# Patient Record
Sex: Female | Born: 1945
Health system: Southern US, Community
[De-identification: ages and names within clinical notes are randomized; demographics above are authoritative.]

## PROBLEM LIST (undated history)

## (undated) DIAGNOSIS — W19XXXA Unspecified fall, initial encounter: Secondary | ICD-10-CM

## (undated) DIAGNOSIS — I10 Essential (primary) hypertension: Secondary | ICD-10-CM

## (undated) DIAGNOSIS — F329 Major depressive disorder, single episode, unspecified: Secondary | ICD-10-CM

## (undated) DIAGNOSIS — R296 Repeated falls: Secondary | ICD-10-CM

## (undated) DIAGNOSIS — G4730 Sleep apnea, unspecified: Secondary | ICD-10-CM

## (undated) DIAGNOSIS — R011 Cardiac murmur, unspecified: Secondary | ICD-10-CM

## (undated) DIAGNOSIS — F419 Anxiety disorder, unspecified: Secondary | ICD-10-CM

## (undated) DIAGNOSIS — K648 Other hemorrhoids: Secondary | ICD-10-CM

## (undated) DIAGNOSIS — M1990 Unspecified osteoarthritis, unspecified site: Secondary | ICD-10-CM

## (undated) DIAGNOSIS — K222 Esophageal obstruction: Secondary | ICD-10-CM

## (undated) DIAGNOSIS — E119 Type 2 diabetes mellitus without complications: Secondary | ICD-10-CM

## (undated) DIAGNOSIS — IMO0001 Reserved for inherently not codable concepts without codable children: Secondary | ICD-10-CM

## (undated) DIAGNOSIS — K5730 Diverticulosis of large intestine without perforation or abscess without bleeding: Secondary | ICD-10-CM

## (undated) DIAGNOSIS — K219 Gastro-esophageal reflux disease without esophagitis: Secondary | ICD-10-CM

## (undated) HISTORY — DX: Other hemorrhoids: K64.8

## (undated) HISTORY — DX: Diverticulosis of large intestine without perforation or abscess without bleeding: K57.30

## (undated) HISTORY — DX: Esophageal obstruction: K22.2

## (undated) HISTORY — DX: Repeated falls: R29.6

## (undated) HISTORY — DX: Anxiety disorder, unspecified: F41.9

## (undated) HISTORY — DX: Unspecified osteoarthritis, unspecified site: M19.90

## (undated) HISTORY — PX: SPLENECTOMY: SUR1306

## (undated) HISTORY — DX: Essential (primary) hypertension: I10

## (undated) HISTORY — PX: COLONOSCOPY: SHX174

## (undated) HISTORY — PX: ABDOMINAL HYSTERECTOMY: SUR658

## (undated) HISTORY — DX: Unspecified fall, initial encounter: W19.XXXA

## (undated) HISTORY — DX: Major depressive disorder, single episode, unspecified: F32.9

## (undated) HISTORY — DX: Sleep apnea, unspecified: G47.30

## (undated) HISTORY — DX: Gastro-esophageal reflux disease without esophagitis: K21.9

## (undated) HISTORY — PX: TIBIA FRACTURE SURGERY: SHX806

---

## 1978-10-25 HISTORY — PX: BREAST CYST EXCISION: SHX579

## 1997-09-18 ENCOUNTER — Other Ambulatory Visit: Admission: RE | Admit: 1997-09-18 | Discharge: 1997-09-18 | Payer: Self-pay | Admitting: Gynecology

## 1997-11-10 ENCOUNTER — Inpatient Hospital Stay (HOSPITAL_COMMUNITY): Admission: RE | Admit: 1997-11-10 | Discharge: 1997-11-12 | Payer: Self-pay | Admitting: Gynecology

## 1997-12-01 ENCOUNTER — Emergency Department (HOSPITAL_COMMUNITY): Admission: EM | Admit: 1997-12-01 | Discharge: 1997-12-01 | Payer: Self-pay | Admitting: Internal Medicine

## 1999-11-14 ENCOUNTER — Ambulatory Visit (HOSPITAL_BASED_OUTPATIENT_CLINIC_OR_DEPARTMENT_OTHER): Admission: RE | Admit: 1999-11-14 | Discharge: 1999-11-14 | Payer: Self-pay | Admitting: Pulmonary Disease

## 1999-12-23 ENCOUNTER — Other Ambulatory Visit: Admission: RE | Admit: 1999-12-23 | Discharge: 1999-12-23 | Payer: Self-pay | Admitting: Gynecology

## 2001-01-13 ENCOUNTER — Other Ambulatory Visit: Admission: RE | Admit: 2001-01-13 | Discharge: 2001-01-13 | Payer: Self-pay | Admitting: Gynecology

## 2002-09-21 ENCOUNTER — Encounter: Payer: Self-pay | Admitting: Internal Medicine

## 2002-09-21 ENCOUNTER — Encounter: Admission: RE | Admit: 2002-09-21 | Discharge: 2002-09-21 | Payer: Self-pay | Admitting: Internal Medicine

## 2003-01-15 ENCOUNTER — Other Ambulatory Visit: Admission: RE | Admit: 2003-01-15 | Discharge: 2003-01-15 | Payer: Self-pay | Admitting: Gynecology

## 2004-01-03 ENCOUNTER — Ambulatory Visit: Payer: Self-pay | Admitting: Adult Health

## 2004-01-16 ENCOUNTER — Other Ambulatory Visit: Admission: RE | Admit: 2004-01-16 | Discharge: 2004-01-16 | Payer: Self-pay | Admitting: Gynecology

## 2004-07-14 ENCOUNTER — Ambulatory Visit: Payer: Self-pay | Admitting: Internal Medicine

## 2004-07-15 ENCOUNTER — Ambulatory Visit: Payer: Self-pay | Admitting: Internal Medicine

## 2004-08-19 ENCOUNTER — Ambulatory Visit: Payer: Self-pay | Admitting: Internal Medicine

## 2004-11-27 ENCOUNTER — Ambulatory Visit: Payer: Self-pay | Admitting: Internal Medicine

## 2004-12-02 ENCOUNTER — Ambulatory Visit: Payer: Self-pay | Admitting: Internal Medicine

## 2004-12-09 ENCOUNTER — Ambulatory Visit: Payer: Self-pay | Admitting: Internal Medicine

## 2005-01-08 ENCOUNTER — Ambulatory Visit: Payer: Self-pay | Admitting: Internal Medicine

## 2005-01-12 ENCOUNTER — Ambulatory Visit: Payer: Self-pay | Admitting: Internal Medicine

## 2005-01-22 ENCOUNTER — Ambulatory Visit: Payer: Self-pay | Admitting: Internal Medicine

## 2005-02-24 ENCOUNTER — Other Ambulatory Visit: Admission: RE | Admit: 2005-02-24 | Discharge: 2005-02-24 | Payer: Self-pay | Admitting: Gynecology

## 2005-07-23 ENCOUNTER — Ambulatory Visit: Payer: Self-pay | Admitting: Internal Medicine

## 2005-08-05 ENCOUNTER — Ambulatory Visit: Payer: Self-pay | Admitting: Internal Medicine

## 2005-08-10 ENCOUNTER — Ambulatory Visit: Payer: Self-pay | Admitting: Internal Medicine

## 2005-08-17 ENCOUNTER — Ambulatory Visit: Payer: Self-pay | Admitting: Cardiology

## 2005-08-17 IMAGING — CT CT PARANASAL SINUSES LIMITED
1 of 2 series · 15 of 25 positions shown, 19 images · non-contrast
Comparison: None.

CLINICAL DATA: 59 year-old with sinus congestion.
 LIMITED CT OF PARANASAL SINUSES:
TECHNIQUE: Limited coronal CT images were obtained through the paranasal sinuses without intravenous contrast.

[Series 4: ltd sinus 3.0 h30s · axial · 0.29mm/px · z∈[-110,-12]mm · 15 of 24 slices shown, 19 images]
[im 2/24  brain]
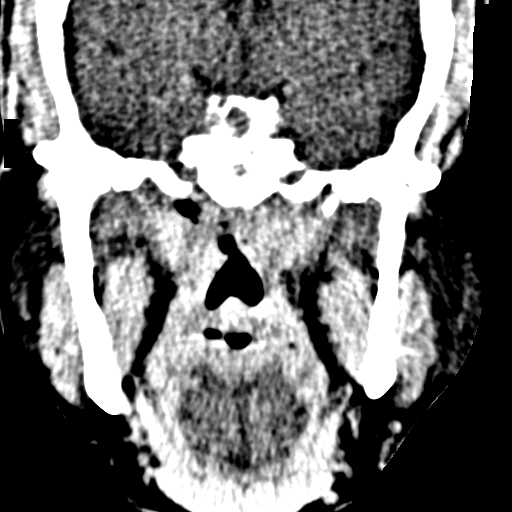
[im 2/24  bone]
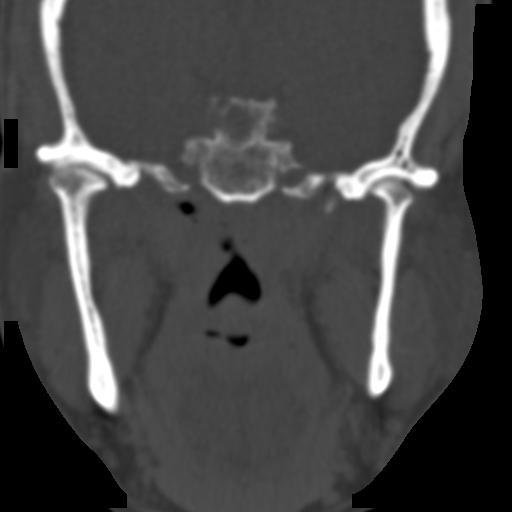
[im 4/24  bone]
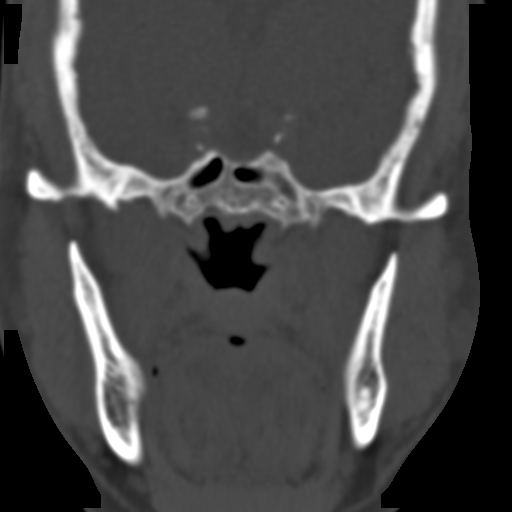
[im 5/24  bone]
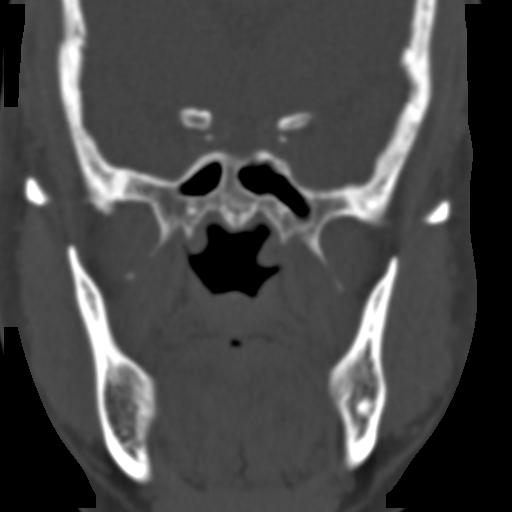
[im 7/24  bone]
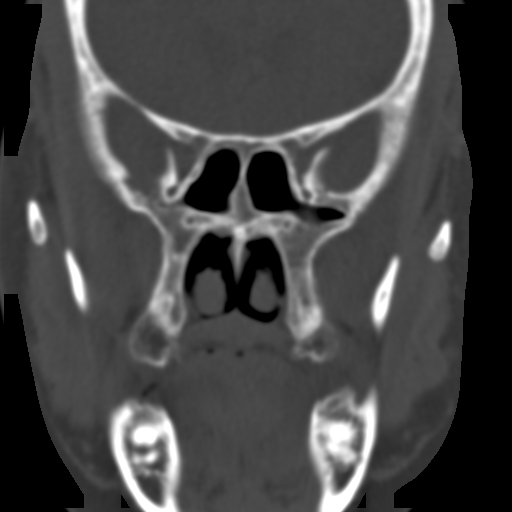
[im 8/24  brain]
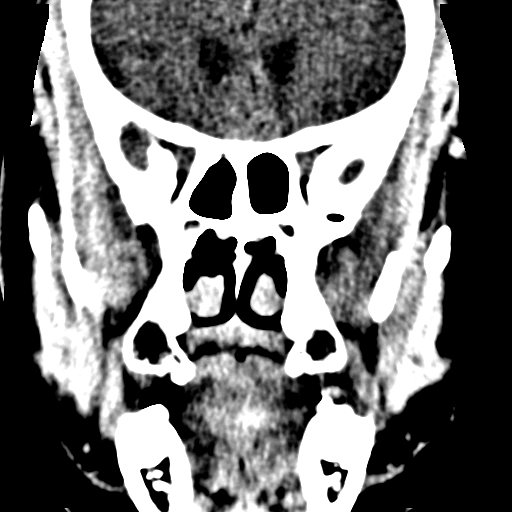
[im 8/24  bone]
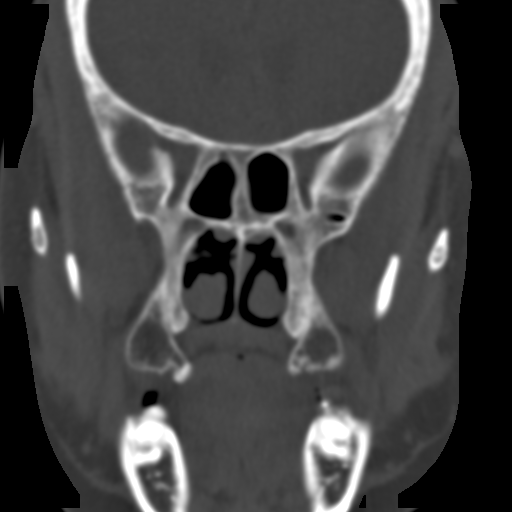
[im 10/24  bone]
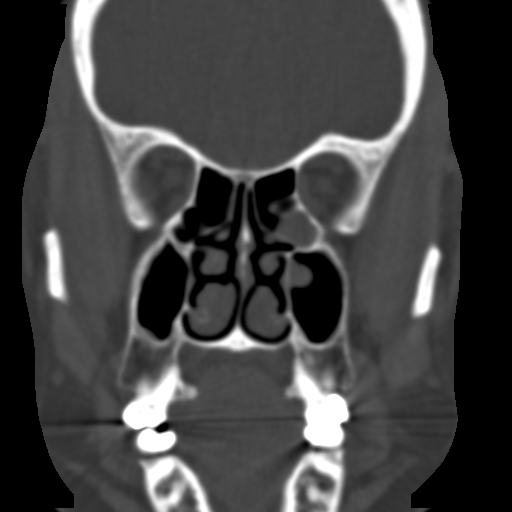
[im 11/24  bone]
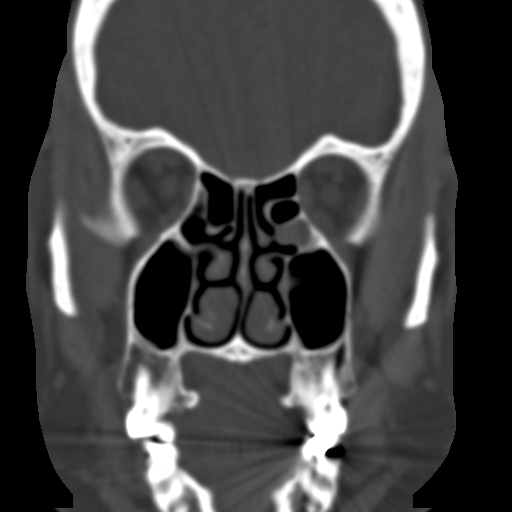
[im 13/24  bone]
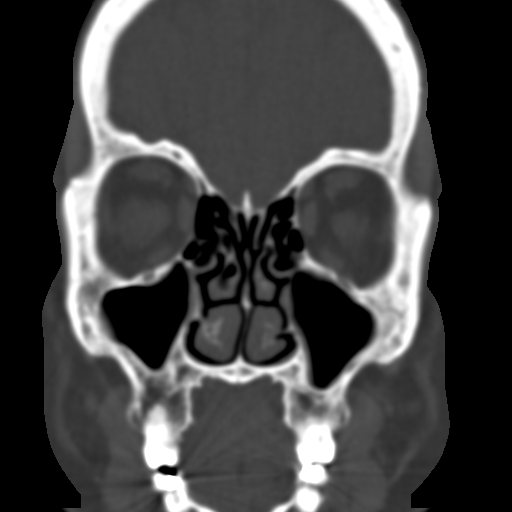
[im 14/24  brain]
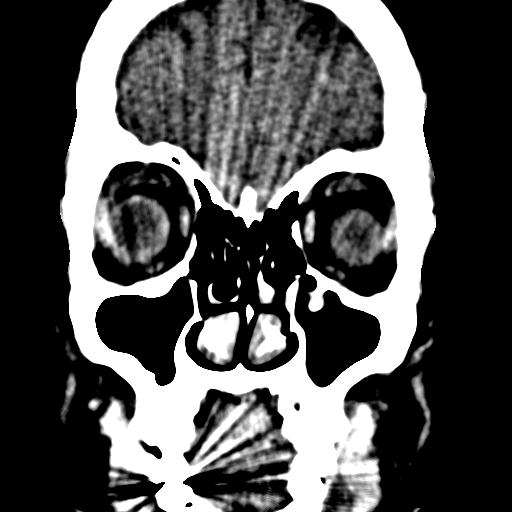
[im 14/24  bone]
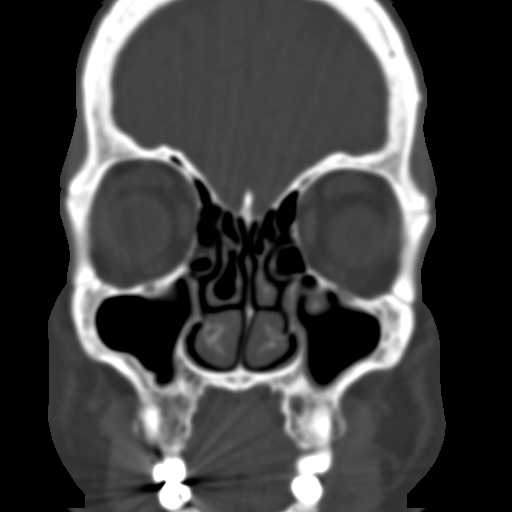
[im 15/24  bone]
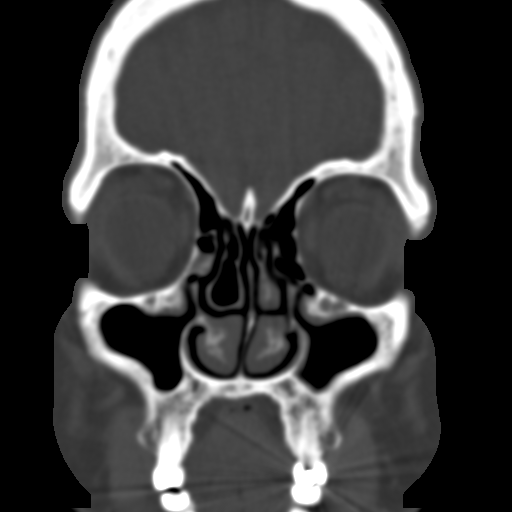
[im 17/24  bone]
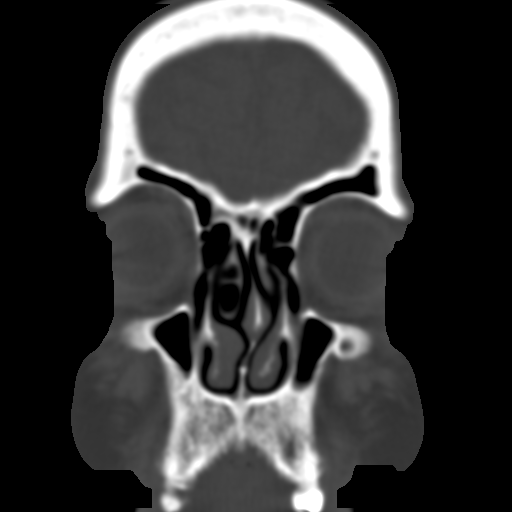
[im 18/24  bone]
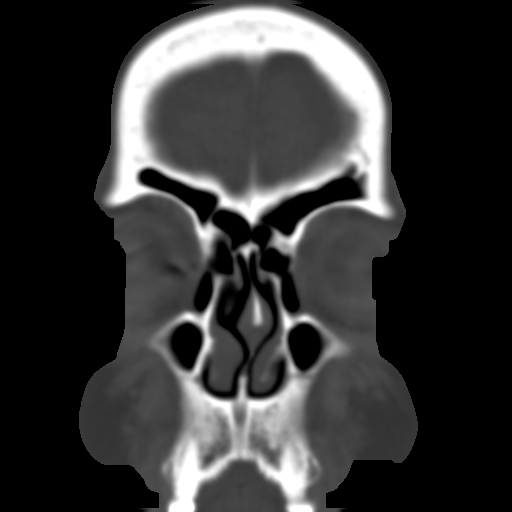
[im 20/24  brain]
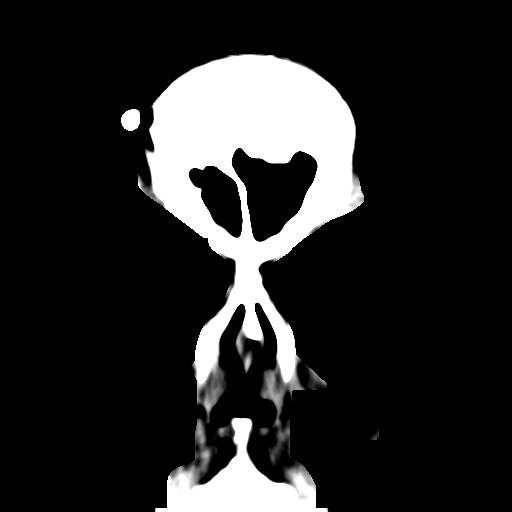
[im 20/24  bone]
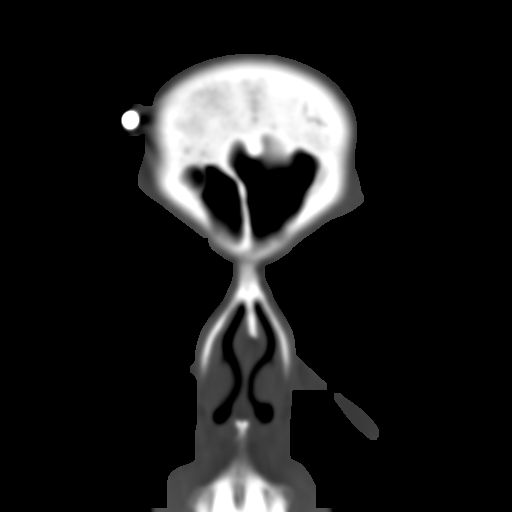
[im 21/24  bone]
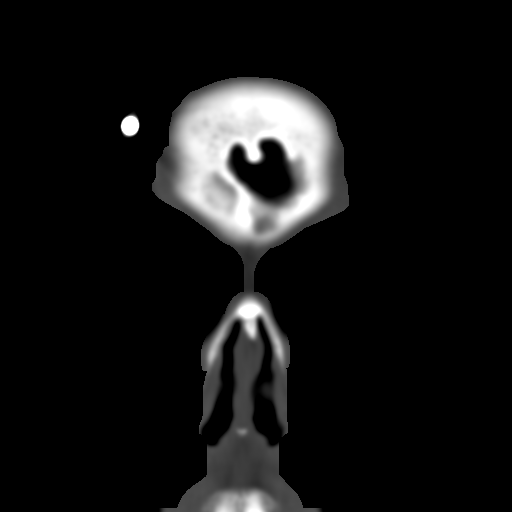
[im 23/24  bone]
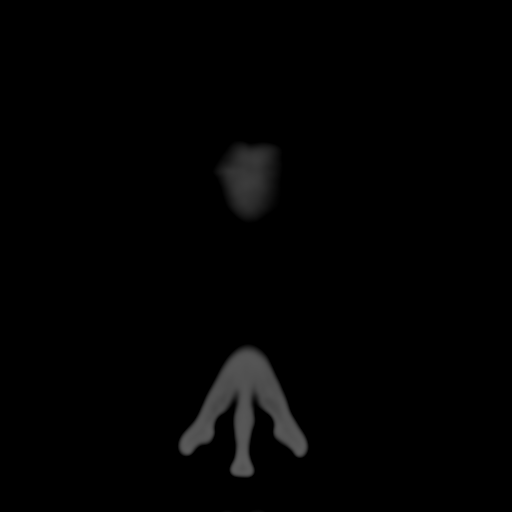

[15 of 25 positions shown; findings below may reference images not displayed]

FINDINGS: There is mild mucoperiosteal thickening involving the maxillary sinuses, left greater than right.  There is also a small mucus retention cyst or polyp in the left maxillary sinus.  There is also a mucus retention cyst or polyp in the left ethmoid air cell. There is concha bullosa of the right middle turbinate. The ostiomeatal complexes are grossly patent. There is fairly marked deviation of the bony nasal septum leftward with mild narrowing of the left middle meatus.  Mild mucosal thickening involving the inferior turbinates bilaterally.  The sphenoid sinus walls are slightly thickened, but no active sinus disease. The frontal sinuses are clear.
IMPRESSION: 1.  Mild mucoperiosteal thickening involving the maxillary sinuses and small mucus retention cyst or polyp in the left maxillary sinus and also in a left ethmoid air cell.
 2.  Deviation of the bony nasal septum leftward with mild narrowing of the left middle meatus.
 3.   Concha bullosa right middle turbinate.
 4.  Grossly patent ostiomeatal complexes.

## 2005-08-18 ENCOUNTER — Ambulatory Visit: Payer: Self-pay | Admitting: Internal Medicine

## 2005-10-27 ENCOUNTER — Ambulatory Visit: Payer: Self-pay | Admitting: Internal Medicine

## 2005-12-04 ENCOUNTER — Ambulatory Visit: Payer: Self-pay | Admitting: Internal Medicine

## 2005-12-28 ENCOUNTER — Ambulatory Visit: Payer: Self-pay | Admitting: Internal Medicine

## 2006-03-29 ENCOUNTER — Ambulatory Visit: Payer: Self-pay | Admitting: Internal Medicine

## 2006-04-06 ENCOUNTER — Ambulatory Visit: Payer: Self-pay | Admitting: Internal Medicine

## 2006-04-06 LAB — CONVERTED CEMR LAB
ALT: 38 units/L (ref 0–40)
AST: 27 units/L (ref 0–37)
Albumin: 3.4 g/dL — ABNORMAL LOW (ref 3.5–5.2)
BUN: 14 mg/dL (ref 6–23)
Basophils Absolute: 0.2 10*3/uL — ABNORMAL HIGH (ref 0.0–0.1)
Bilirubin, Direct: 0.1 mg/dL (ref 0.0–0.3)
Calcium: 9.6 mg/dL (ref 8.4–10.5)
Chloride: 99 meq/L (ref 96–112)
Cholesterol: 197 mg/dL (ref 0–200)
Creatinine, Ser: 1 mg/dL (ref 0.4–1.2)
Eosinophils Absolute: 0.7 10*3/uL — ABNORMAL HIGH (ref 0.0–0.6)
Eosinophils Relative: 6.2 % — ABNORMAL HIGH (ref 0.0–5.0)
GFR calc Af Amer: 73 mL/min
GFR calc non Af Amer: 60 mL/min
Glucose, Bld: 123 mg/dL — ABNORMAL HIGH (ref 70–99)
HDL: 60.2 mg/dL (ref >39.0)
Hgb A1c MFr Bld: 6.2 % — ABNORMAL HIGH (ref 4.6–6.0)
LDL Cholesterol: 111 mg/dL — ABNORMAL HIGH (ref 0–99)
Lymphocytes Relative: 38 % (ref 12.0–46.0)
MCHC: 34.2 g/dL (ref 30.0–36.0)
MCV: 88.9 fL (ref 78.0–100.0)
Monocytes Relative: 11.6 % — ABNORMAL HIGH (ref 3.0–11.0)
Neutro Abs: 4.6 10*3/uL (ref 1.4–7.7)
Platelets: 416 10*3/uL — ABNORMAL HIGH (ref 150–400)
RBC: 4.19 M/uL (ref 3.87–5.11)
Total CHOL/HDL Ratio: 3.3
Triglycerides: 130 mg/dL (ref 0–149)
WBC: 10.9 10*3/uL — ABNORMAL HIGH (ref 4.5–10.5)

## 2006-04-27 ENCOUNTER — Other Ambulatory Visit: Admission: RE | Admit: 2006-04-27 | Discharge: 2006-04-27 | Payer: Self-pay | Admitting: Gynecology

## 2006-05-26 ENCOUNTER — Ambulatory Visit: Payer: Self-pay | Admitting: Internal Medicine

## 2006-05-26 ENCOUNTER — Ambulatory Visit: Payer: Self-pay | Admitting: Pulmonary Disease

## 2006-07-27 ENCOUNTER — Ambulatory Visit: Payer: Self-pay | Admitting: Internal Medicine

## 2006-07-27 ENCOUNTER — Ambulatory Visit: Payer: Self-pay | Admitting: Pulmonary Disease

## 2006-08-04 ENCOUNTER — Ambulatory Visit: Payer: Self-pay | Admitting: Internal Medicine

## 2006-08-04 LAB — CONVERTED CEMR LAB
ALT: 27 units/L (ref 0–40)
AST: 19 units/L (ref 0–37)
Albumin: 3.2 g/dL — ABNORMAL LOW (ref 3.5–5.2)
Alkaline Phosphatase: 54 units/L (ref 39–117)
BUN: 25 mg/dL — ABNORMAL HIGH (ref 6–23)
Calcium: 9.4 mg/dL (ref 8.4–10.5)
Chloride: 99 meq/L (ref 96–112)
Cholesterol: 226 mg/dL (ref 0–200)
Eosinophils Absolute: 0.4 10*3/uL (ref 0.0–0.6)
GFR calc Af Amer: 37 mL/min
GFR calc non Af Amer: 31 mL/min
HDL: 58.6 mg/dL (ref >39.0)
Lymphocytes Relative: 24.4 % (ref 12.0–46.0)
MCV: 90.6 fL (ref 78.0–100.0)
Monocytes Relative: 8.4 % (ref 3.0–11.0)
Neutro Abs: 9.5 10*3/uL — ABNORMAL HIGH (ref 1.4–7.7)
Platelets: 449 10*3/uL — ABNORMAL HIGH (ref 150–400)
RBC: 4.05 M/uL (ref 3.87–5.11)
TSH: 0.74 microintl units/mL (ref 0.35–5.50)
Triglycerides: 118 mg/dL (ref 0–149)
VLDL: 24 mg/dL (ref 0–40)
Vit D, 1,25-Dihydroxy: 11 — ABNORMAL LOW (ref 20–57)

## 2006-11-09 ENCOUNTER — Ambulatory Visit: Payer: Self-pay | Admitting: Internal Medicine

## 2006-11-09 LAB — CONVERTED CEMR LAB
ALT: 24 units/L (ref 0–35)
AST: 21 units/L (ref 0–37)
Albumin: 3.8 g/dL (ref 3.5–5.2)
Alkaline Phosphatase: 62 units/L (ref 39–117)
BUN: 13 mg/dL (ref 6–23)
Calcium: 10.1 mg/dL (ref 8.4–10.5)
Chloride: 107 meq/L (ref 96–112)
GFR calc non Af Amer: 54 mL/min
Hgb A1c MFr Bld: 6.3 % — ABNORMAL HIGH (ref 4.6–6.0)
Sodium: 147 meq/L — ABNORMAL HIGH (ref 135–145)

## 2006-12-02 ENCOUNTER — Ambulatory Visit: Payer: Self-pay | Admitting: Internal Medicine

## 2007-01-12 ENCOUNTER — Encounter: Payer: Self-pay | Admitting: Internal Medicine

## 2007-01-26 ENCOUNTER — Encounter: Payer: Self-pay | Admitting: Internal Medicine

## 2007-01-26 DIAGNOSIS — R32 Unspecified urinary incontinence: Secondary | ICD-10-CM | POA: Insufficient documentation

## 2007-01-26 DIAGNOSIS — I10 Essential (primary) hypertension: Secondary | ICD-10-CM

## 2007-01-26 DIAGNOSIS — J45909 Unspecified asthma, uncomplicated: Secondary | ICD-10-CM | POA: Insufficient documentation

## 2007-01-26 DIAGNOSIS — B029 Zoster without complications: Secondary | ICD-10-CM | POA: Insufficient documentation

## 2007-01-26 DIAGNOSIS — B0222 Postherpetic trigeminal neuralgia: Secondary | ICD-10-CM

## 2007-01-26 DIAGNOSIS — G43909 Migraine, unspecified, not intractable, without status migrainosus: Secondary | ICD-10-CM | POA: Insufficient documentation

## 2007-01-26 HISTORY — DX: Essential (primary) hypertension: I10

## 2007-01-26 HISTORY — DX: Postherpetic trigeminal neuralgia: B02.22

## 2007-01-26 HISTORY — DX: Unspecified urinary incontinence: R32

## 2007-02-15 ENCOUNTER — Ambulatory Visit: Payer: Self-pay | Admitting: Internal Medicine

## 2007-02-15 DIAGNOSIS — F411 Generalized anxiety disorder: Secondary | ICD-10-CM

## 2007-02-15 DIAGNOSIS — K219 Gastro-esophageal reflux disease without esophagitis: Secondary | ICD-10-CM | POA: Insufficient documentation

## 2007-02-15 DIAGNOSIS — M1990 Unspecified osteoarthritis, unspecified site: Secondary | ICD-10-CM | POA: Insufficient documentation

## 2007-02-15 DIAGNOSIS — F4323 Adjustment disorder with mixed anxiety and depressed mood: Secondary | ICD-10-CM

## 2007-02-15 HISTORY — DX: Adjustment disorder with mixed anxiety and depressed mood: F43.23

## 2007-02-15 HISTORY — DX: Gastro-esophageal reflux disease without esophagitis: K21.9

## 2007-05-02 ENCOUNTER — Encounter: Payer: Self-pay | Admitting: Internal Medicine

## 2007-05-11 ENCOUNTER — Ambulatory Visit: Payer: Self-pay | Admitting: Internal Medicine

## 2007-05-11 DIAGNOSIS — R5383 Other fatigue: Secondary | ICD-10-CM

## 2007-05-11 DIAGNOSIS — R5381 Other malaise: Secondary | ICD-10-CM | POA: Insufficient documentation

## 2007-05-11 LAB — CONVERTED CEMR LAB
AST: 19 units/L (ref 0–37)
Albumin: 3.4 g/dL — ABNORMAL LOW (ref 3.5–5.2)
Alkaline Phosphatase: 56 units/L (ref 39–117)
CO2: 34 meq/L — ABNORMAL HIGH (ref 19–32)
Chloride: 99 meq/L (ref 96–112)
Creatinine, Ser: 1 mg/dL (ref 0.4–1.2)
Sodium: 139 meq/L (ref 135–145)
TSH: 0.71 microintl units/mL (ref 0.35–5.50)
Total Bilirubin: 0.5 mg/dL (ref 0.3–1.2)

## 2007-05-12 ENCOUNTER — Encounter: Payer: Self-pay | Admitting: Internal Medicine

## 2007-05-18 ENCOUNTER — Ambulatory Visit: Payer: Self-pay | Admitting: Internal Medicine

## 2007-05-30 ENCOUNTER — Encounter: Payer: Self-pay | Admitting: Internal Medicine

## 2007-07-28 ENCOUNTER — Encounter: Payer: Self-pay | Admitting: Internal Medicine

## 2007-08-18 ENCOUNTER — Encounter: Payer: Self-pay | Admitting: Internal Medicine

## 2007-08-30 ENCOUNTER — Ambulatory Visit: Payer: Self-pay | Admitting: Internal Medicine

## 2007-08-30 LAB — CONVERTED CEMR LAB
ALT: 15 units/L (ref 0–35)
AST: 19 units/L (ref 0–37)
Albumin: 3.4 g/dL — ABNORMAL LOW (ref 3.5–5.2)
BUN: 17 mg/dL (ref 6–23)
Bilirubin Urine: NEGATIVE
CO2: 33 meq/L — ABNORMAL HIGH (ref 19–32)
Chloride: 99 meq/L (ref 96–112)
Creatinine, Ser: 1.1 mg/dL (ref 0.4–1.2)
Hgb A1c MFr Bld: 6.3 % — ABNORMAL HIGH (ref 4.6–6.0)
pH: 7.5 (ref 5.0–8.0)

## 2007-10-26 ENCOUNTER — Ambulatory Visit: Payer: Self-pay | Admitting: Internal Medicine

## 2007-10-26 DIAGNOSIS — H6690 Otitis media, unspecified, unspecified ear: Secondary | ICD-10-CM | POA: Insufficient documentation

## 2007-10-26 DIAGNOSIS — R1110 Vomiting, unspecified: Secondary | ICD-10-CM | POA: Insufficient documentation

## 2007-10-26 DIAGNOSIS — R51 Headache: Secondary | ICD-10-CM

## 2007-10-27 ENCOUNTER — Telehealth (INDEPENDENT_AMBULATORY_CARE_PROVIDER_SITE_OTHER): Payer: Self-pay | Admitting: *Deleted

## 2007-11-01 ENCOUNTER — Telehealth: Payer: Self-pay | Admitting: Internal Medicine

## 2007-11-22 ENCOUNTER — Telehealth: Payer: Self-pay | Admitting: Internal Medicine

## 2007-12-01 ENCOUNTER — Ambulatory Visit: Payer: Self-pay | Admitting: Internal Medicine

## 2007-12-01 LAB — CONVERTED CEMR LAB
ALT: 18 units/L (ref 0–35)
Basophils Absolute: 0.1 10*3/uL (ref 0.0–0.1)
Bilirubin, Direct: 0.1 mg/dL (ref 0.0–0.3)
CO2: 33 meq/L — ABNORMAL HIGH (ref 19–32)
Calcium: 9 mg/dL (ref 8.4–10.5)
Chloride: 101 meq/L (ref 96–112)
GFR calc non Af Amer: 60 mL/min
Lymphocytes Relative: 45.6 % (ref 12.0–46.0)
MCHC: 32.5 g/dL (ref 30.0–36.0)
Neutro Abs: 3.4 10*3/uL (ref 1.4–7.7)
Neutrophils Relative %: 39.8 % — ABNORMAL LOW (ref 43.0–77.0)
RDW: 13.4 % (ref 11.5–14.6)
Sodium: 139 meq/L (ref 135–145)
TSH: 0.82 microintl units/mL (ref 0.35–5.50)
Total Bilirubin: 0.6 mg/dL (ref 0.3–1.2)

## 2007-12-02 ENCOUNTER — Ambulatory Visit: Payer: Self-pay | Admitting: Internal Medicine

## 2007-12-02 DIAGNOSIS — R7309 Other abnormal glucose: Secondary | ICD-10-CM | POA: Insufficient documentation

## 2007-12-02 DIAGNOSIS — R42 Dizziness and giddiness: Secondary | ICD-10-CM | POA: Insufficient documentation

## 2007-12-09 ENCOUNTER — Telehealth: Payer: Self-pay | Admitting: Internal Medicine

## 2008-01-23 ENCOUNTER — Encounter: Payer: Self-pay | Admitting: Internal Medicine

## 2008-04-11 ENCOUNTER — Ambulatory Visit: Payer: Self-pay | Admitting: Internal Medicine

## 2008-04-11 LAB — CONVERTED CEMR LAB
ALT: 16 units/L (ref 0–35)
AST: 17 units/L (ref 0–37)
Alkaline Phosphatase: 55 units/L (ref 39–117)
Bilirubin, Direct: 0.1 mg/dL (ref 0.0–0.3)
CO2: 34 meq/L — ABNORMAL HIGH (ref 19–32)
Chloride: 99 meq/L (ref 96–112)
Creatinine, Ser: 1 mg/dL (ref 0.4–1.2)
Potassium: 4.2 meq/L (ref 3.5–5.1)
Total Bilirubin: 0.9 mg/dL (ref 0.3–1.2)

## 2008-04-13 ENCOUNTER — Ambulatory Visit: Payer: Self-pay | Admitting: Internal Medicine

## 2008-05-23 ENCOUNTER — Encounter: Payer: Self-pay | Admitting: Internal Medicine

## 2008-07-10 ENCOUNTER — Ambulatory Visit: Payer: Self-pay | Admitting: Internal Medicine

## 2008-07-10 LAB — CONVERTED CEMR LAB
ALT: 29 units/L (ref 0–35)
Albumin: 3.5 g/dL (ref 3.5–5.2)
BUN: 15 mg/dL (ref 6–23)
CO2: 33 meq/L — ABNORMAL HIGH (ref 19–32)
Calcium: 9.4 mg/dL (ref 8.4–10.5)
Chloride: 106 meq/L (ref 96–112)
Creatinine, Ser: 1 mg/dL (ref 0.4–1.2)
Glucose, Bld: 105 mg/dL — ABNORMAL HIGH (ref 70–99)
Total Protein: 7.3 g/dL (ref 6.0–8.3)

## 2008-07-11 ENCOUNTER — Encounter: Payer: Self-pay | Admitting: Internal Medicine

## 2008-07-13 ENCOUNTER — Ambulatory Visit: Payer: Self-pay | Admitting: Internal Medicine

## 2008-07-17 ENCOUNTER — Ambulatory Visit: Payer: Self-pay | Admitting: Licensed Clinical Social Worker

## 2008-08-02 ENCOUNTER — Telehealth: Payer: Self-pay | Admitting: Internal Medicine

## 2008-08-03 ENCOUNTER — Ambulatory Visit: Payer: Self-pay | Admitting: Licensed Clinical Social Worker

## 2008-08-06 ENCOUNTER — Telehealth: Payer: Self-pay | Admitting: Internal Medicine

## 2008-08-15 ENCOUNTER — Encounter: Payer: Self-pay | Admitting: Internal Medicine

## 2008-08-17 ENCOUNTER — Ambulatory Visit: Payer: Self-pay | Admitting: Licensed Clinical Social Worker

## 2008-08-31 ENCOUNTER — Ambulatory Visit: Payer: Self-pay | Admitting: Licensed Clinical Social Worker

## 2008-09-18 ENCOUNTER — Ambulatory Visit: Payer: Self-pay | Admitting: Licensed Clinical Social Worker

## 2008-10-02 ENCOUNTER — Ambulatory Visit: Payer: Self-pay | Admitting: Licensed Clinical Social Worker

## 2008-10-19 ENCOUNTER — Ambulatory Visit: Payer: Self-pay | Admitting: Licensed Clinical Social Worker

## 2008-10-23 ENCOUNTER — Encounter: Payer: Self-pay | Admitting: Internal Medicine

## 2008-11-06 ENCOUNTER — Telehealth: Payer: Self-pay | Admitting: Internal Medicine

## 2008-11-09 ENCOUNTER — Ambulatory Visit: Payer: Self-pay | Admitting: Licensed Clinical Social Worker

## 2008-11-23 ENCOUNTER — Ambulatory Visit: Payer: Self-pay | Admitting: Licensed Clinical Social Worker

## 2008-12-07 ENCOUNTER — Ambulatory Visit: Payer: Self-pay | Admitting: Licensed Clinical Social Worker

## 2008-12-21 ENCOUNTER — Ambulatory Visit: Payer: Self-pay | Admitting: Licensed Clinical Social Worker

## 2009-01-03 ENCOUNTER — Ambulatory Visit: Payer: Self-pay | Admitting: Internal Medicine

## 2009-01-04 ENCOUNTER — Ambulatory Visit: Payer: Self-pay | Admitting: Licensed Clinical Social Worker

## 2009-01-25 ENCOUNTER — Ambulatory Visit: Payer: Self-pay | Admitting: Licensed Clinical Social Worker

## 2009-02-19 ENCOUNTER — Telehealth (INDEPENDENT_AMBULATORY_CARE_PROVIDER_SITE_OTHER): Payer: Self-pay | Admitting: *Deleted

## 2009-02-19 ENCOUNTER — Ambulatory Visit: Payer: Self-pay | Admitting: Licensed Clinical Social Worker

## 2009-04-04 ENCOUNTER — Telehealth: Payer: Self-pay | Admitting: Internal Medicine

## 2009-04-05 ENCOUNTER — Ambulatory Visit: Payer: Self-pay | Admitting: Internal Medicine

## 2009-04-05 DIAGNOSIS — J209 Acute bronchitis, unspecified: Secondary | ICD-10-CM | POA: Insufficient documentation

## 2009-04-30 ENCOUNTER — Telehealth: Payer: Self-pay | Admitting: Internal Medicine

## 2009-05-02 ENCOUNTER — Telehealth: Payer: Self-pay | Admitting: Internal Medicine

## 2009-05-28 ENCOUNTER — Telehealth: Payer: Self-pay | Admitting: Internal Medicine

## 2009-08-30 ENCOUNTER — Telehealth: Payer: Self-pay | Admitting: Internal Medicine

## 2009-09-30 ENCOUNTER — Telehealth: Payer: Self-pay | Admitting: Internal Medicine

## 2009-10-02 ENCOUNTER — Ambulatory Visit: Payer: Self-pay | Admitting: Internal Medicine

## 2009-10-02 DIAGNOSIS — R197 Diarrhea, unspecified: Secondary | ICD-10-CM | POA: Insufficient documentation

## 2009-10-02 DIAGNOSIS — E860 Dehydration: Secondary | ICD-10-CM | POA: Insufficient documentation

## 2009-10-04 ENCOUNTER — Encounter: Payer: Self-pay | Admitting: Internal Medicine

## 2009-10-04 ENCOUNTER — Emergency Department (HOSPITAL_COMMUNITY): Admission: EM | Admit: 2009-10-04 | Discharge: 2009-10-04 | Payer: Self-pay | Admitting: Family Medicine

## 2009-10-10 ENCOUNTER — Encounter: Payer: Self-pay | Admitting: Internal Medicine

## 2009-11-06 ENCOUNTER — Telehealth: Payer: Self-pay | Admitting: Internal Medicine

## 2010-01-13 ENCOUNTER — Telehealth: Payer: Self-pay | Admitting: Internal Medicine

## 2010-01-13 ENCOUNTER — Ambulatory Visit: Payer: Self-pay | Admitting: Endocrinology

## 2010-01-13 DIAGNOSIS — J069 Acute upper respiratory infection, unspecified: Secondary | ICD-10-CM | POA: Insufficient documentation

## 2010-01-13 IMAGING — CR DG CHEST 2V
2 series · 2 of 2 positions shown · non-contrast
Comparison: Chest [DATE].

CLINICAL DATA: Cough.

CHEST - 2 VIEW

[view not recorded (1 of 2)]
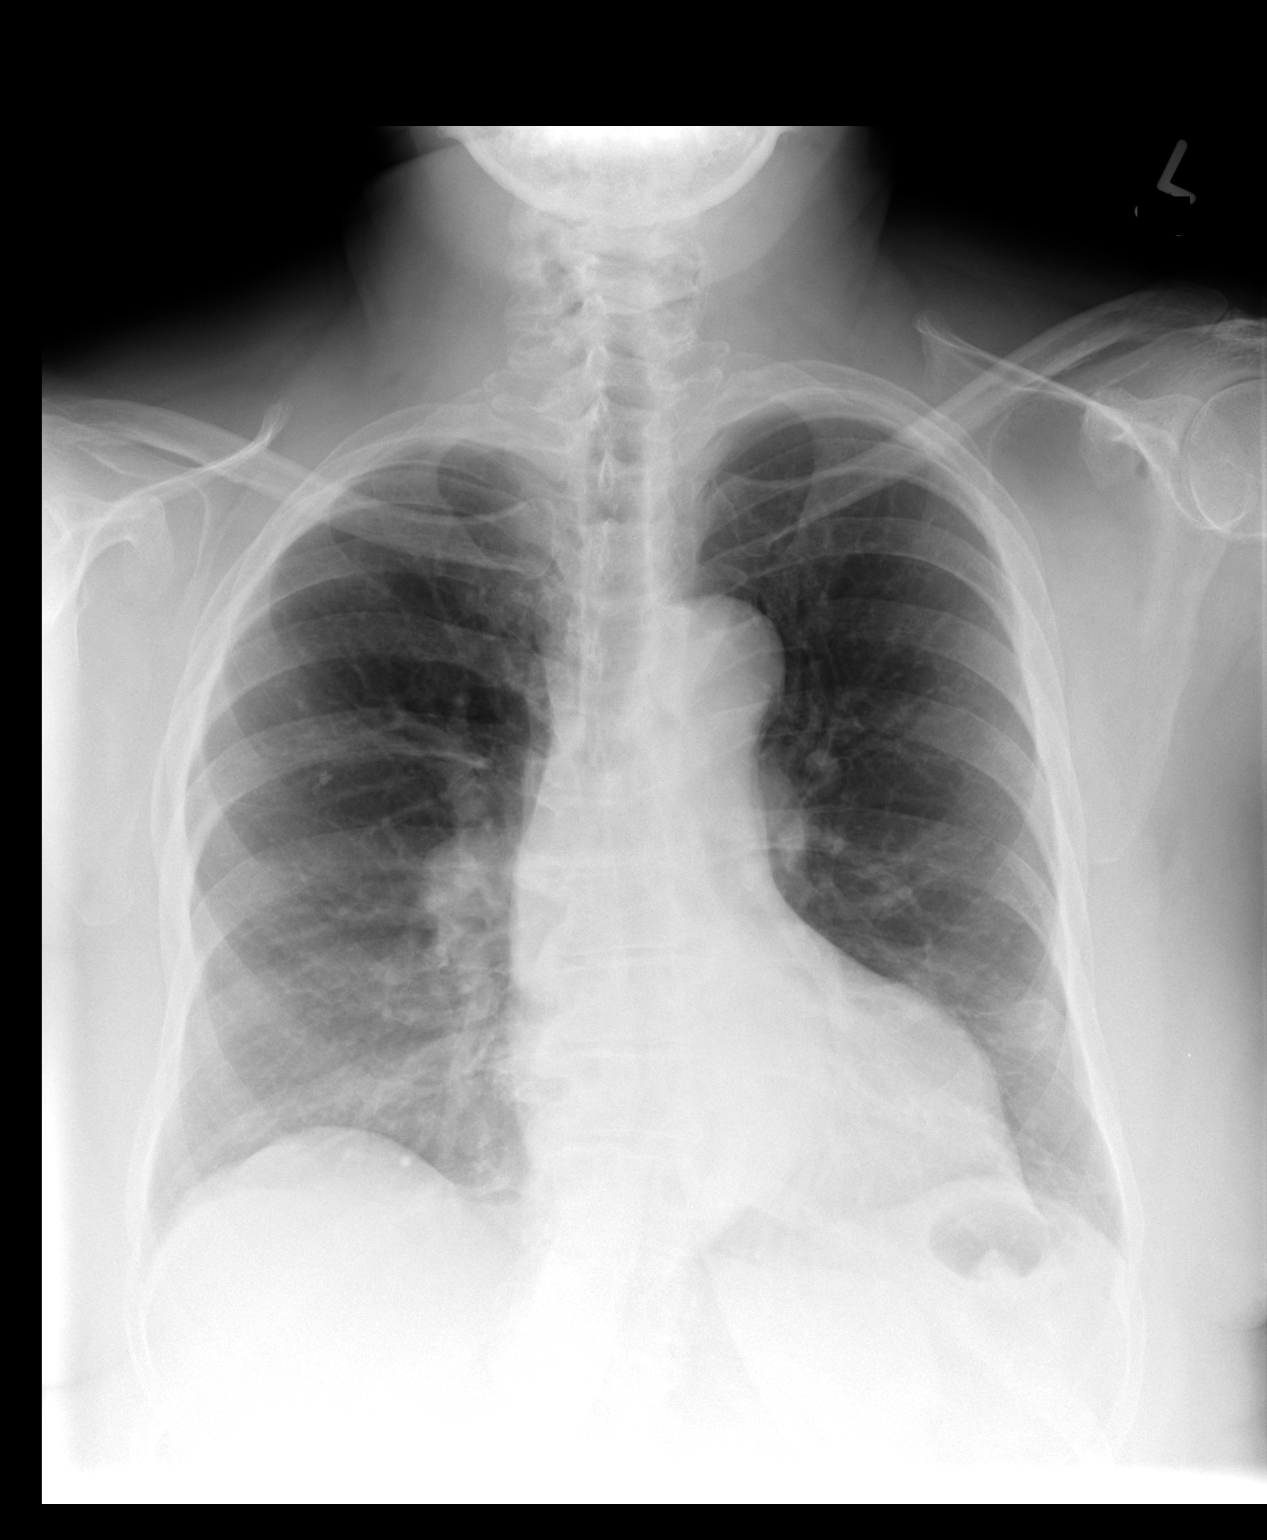

[view not recorded (2 of 2)]
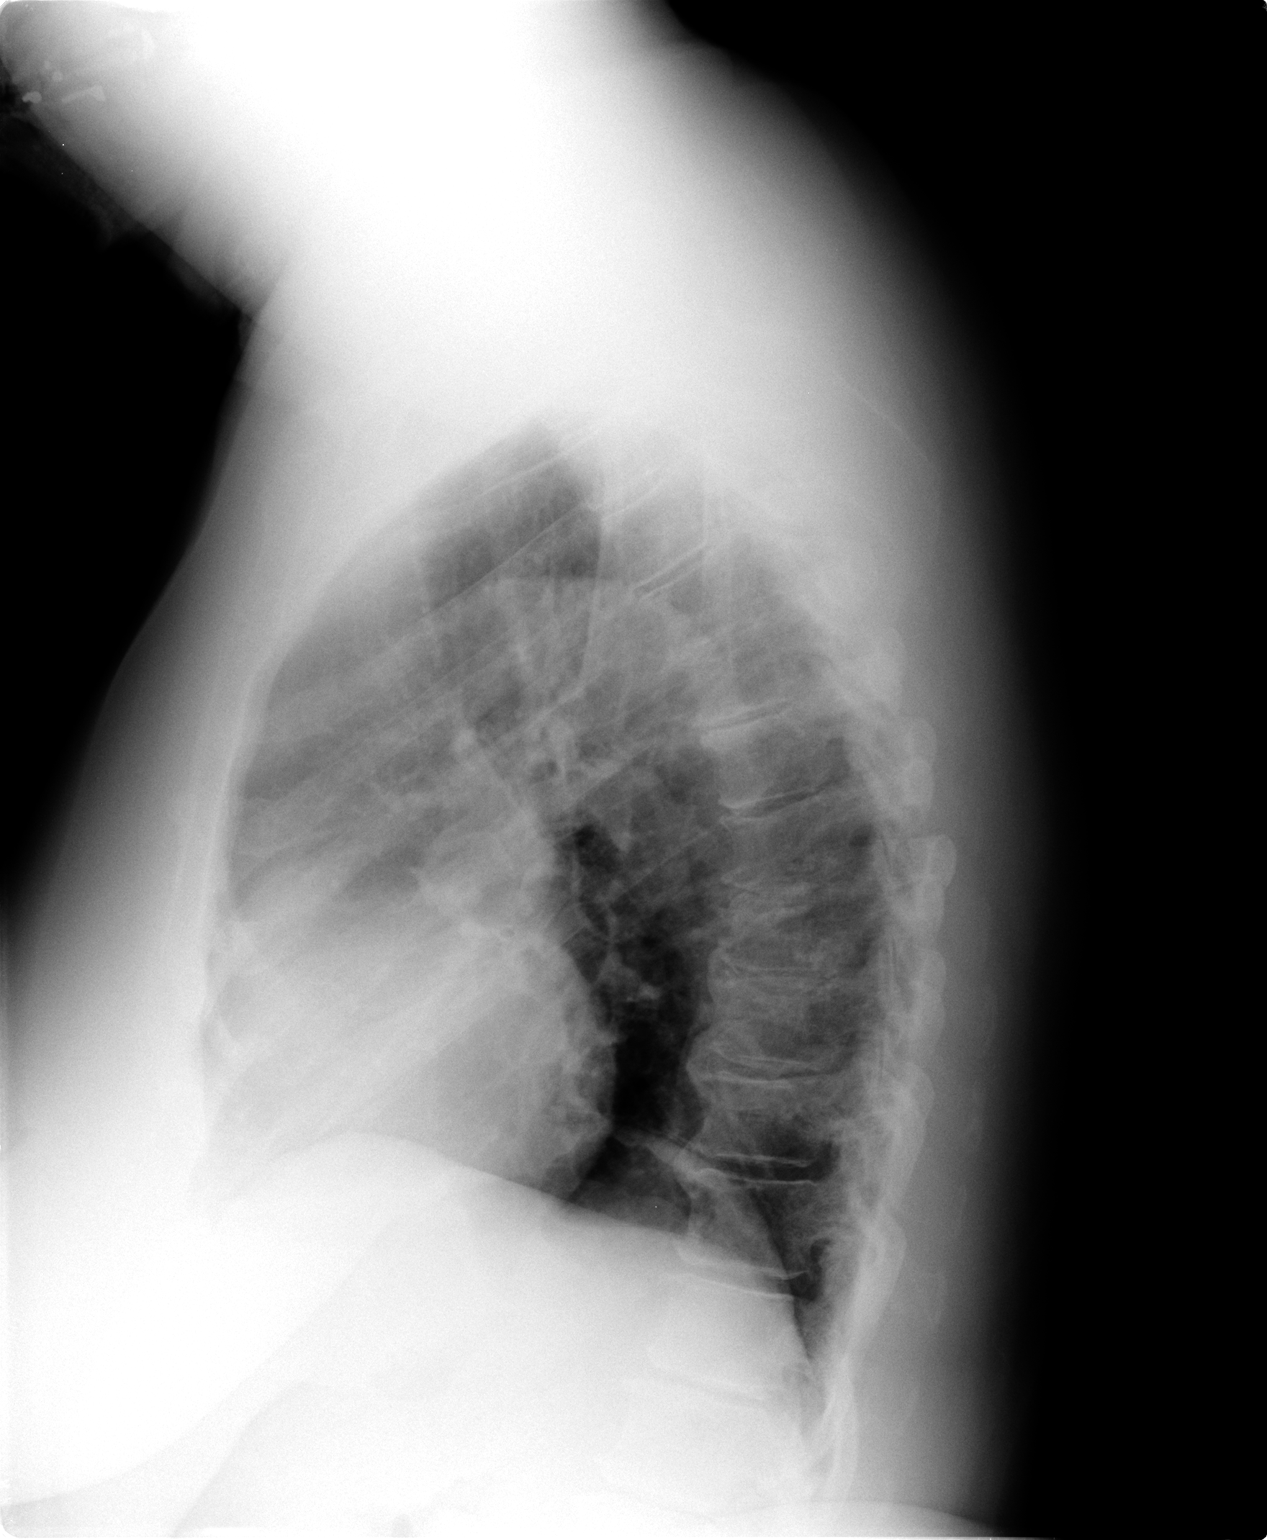

[2 of 2 positions shown; findings below may reference images not displayed]

FINDINGS: Lungs are clear.  Mild cardiomegaly.  No pleural effusion
or focal bony abnormality.
IMPRESSION: Mild cardiomegaly.  Otherwise negative.

## 2010-01-15 ENCOUNTER — Telehealth: Payer: Self-pay | Admitting: Internal Medicine

## 2010-01-17 ENCOUNTER — Ambulatory Visit: Payer: Self-pay | Admitting: Family Medicine

## 2010-01-19 ENCOUNTER — Emergency Department (HOSPITAL_COMMUNITY): Admission: EM | Admit: 2010-01-19 | Discharge: 2010-01-19 | Payer: Self-pay | Admitting: Emergency Medicine

## 2010-01-19 IMAGING — CR DG CHEST 2V
2 series · 2 of 2 positions shown · non-contrast
Comparison: [DATE]

CLINICAL DATA: Nausea and vomiting

CHEST - 2 VIEW

[w chest pa]
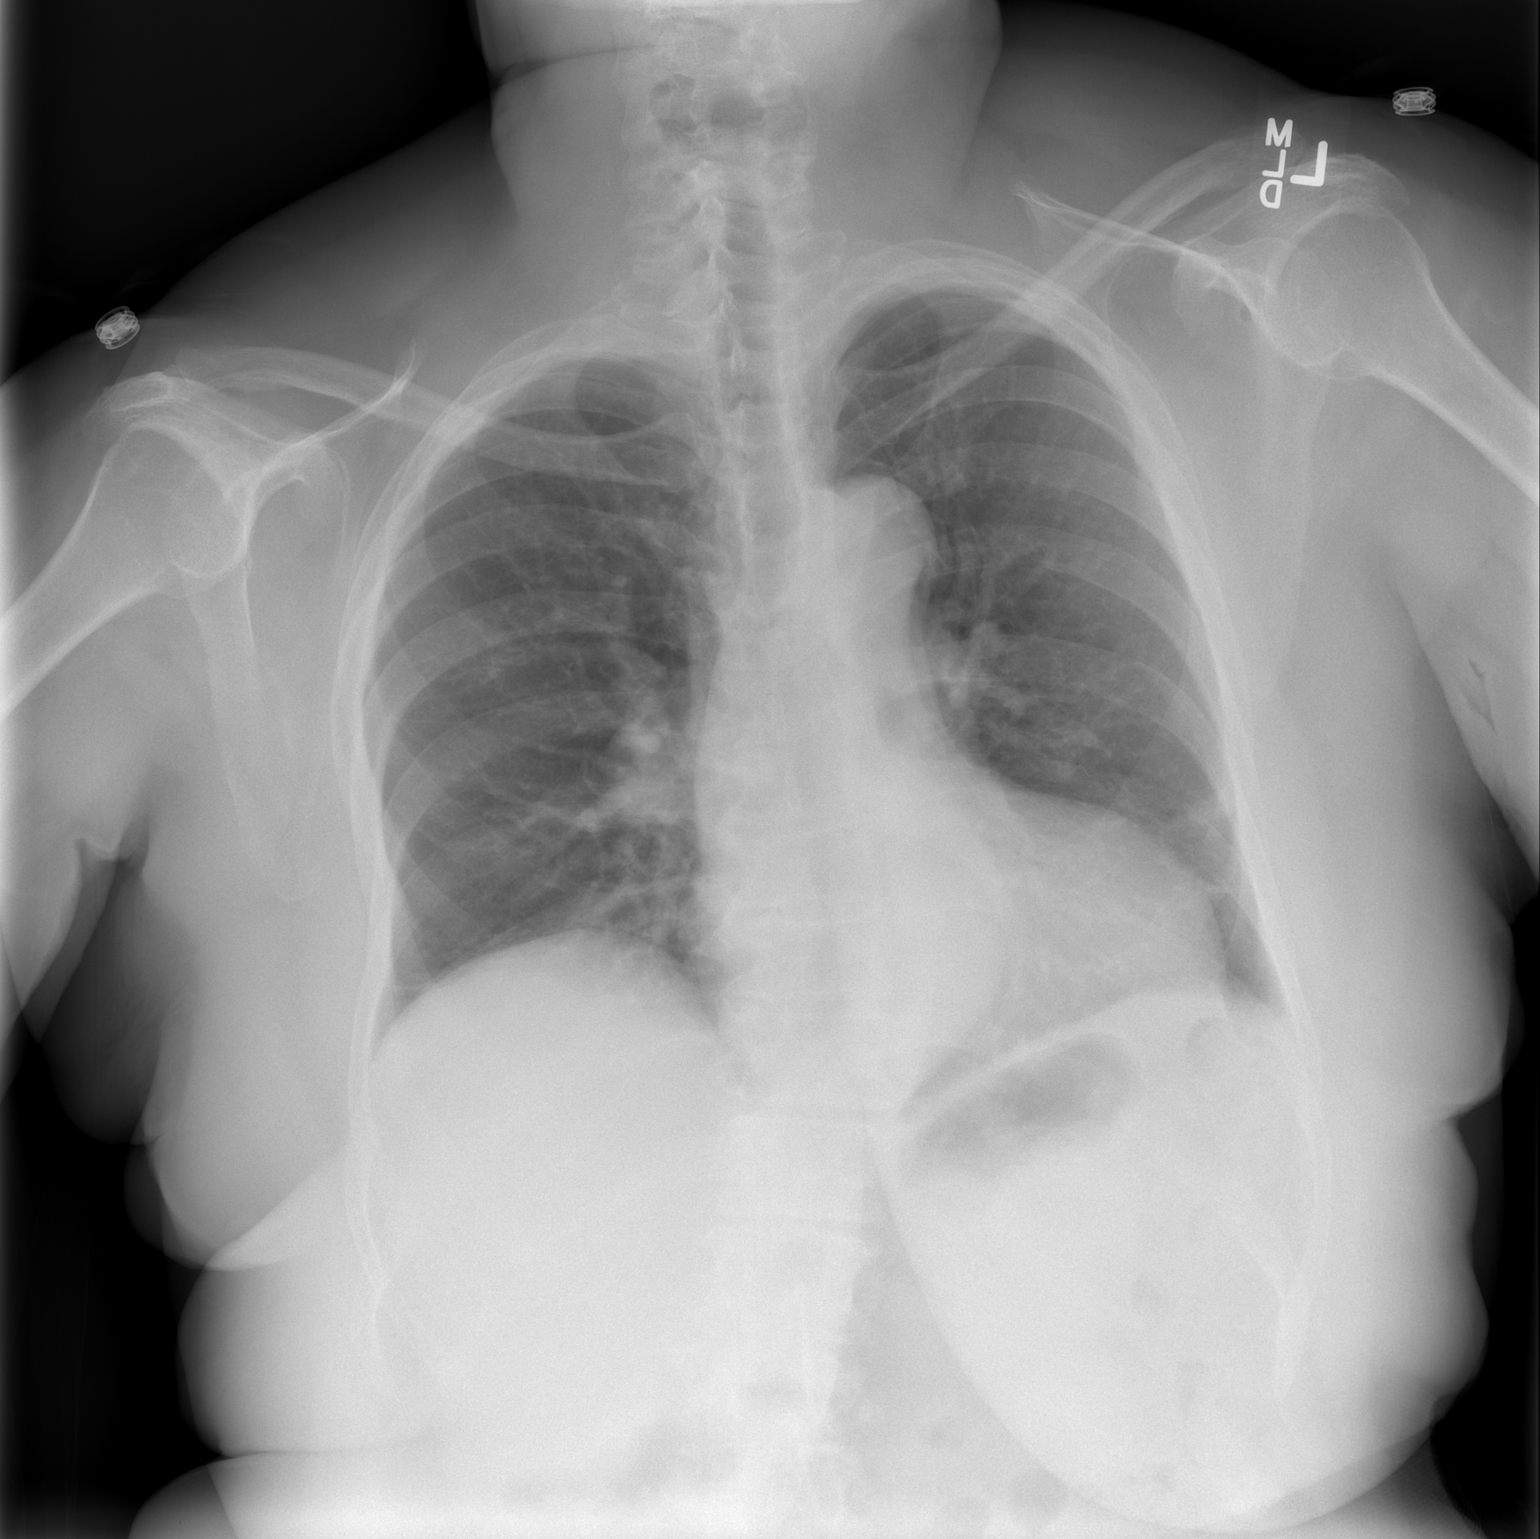

[w chest lat]
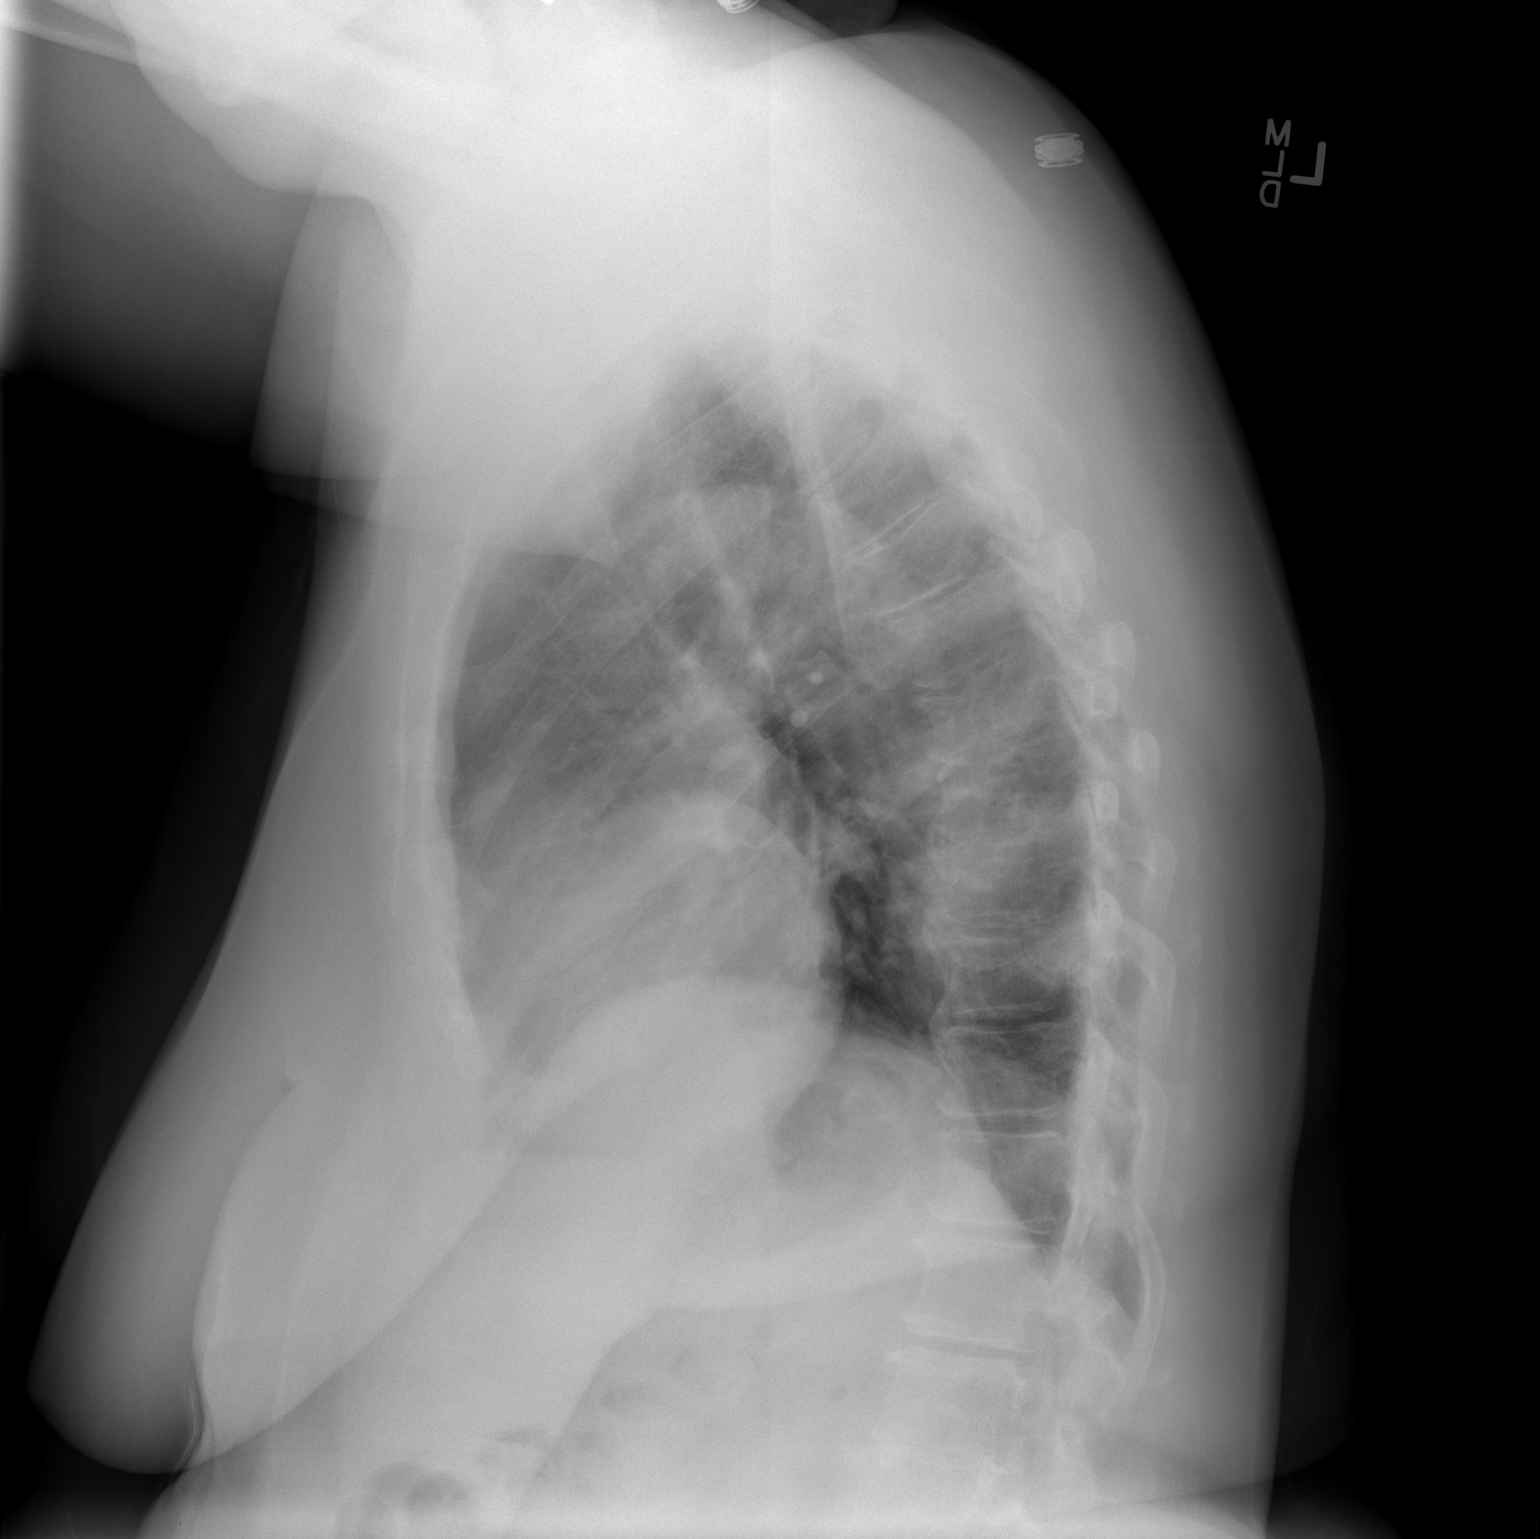

[2 of 2 positions shown; findings below may reference images not displayed]

FINDINGS: Mild cardiomegaly.  Bibasilar atelectasis.  No
pneumothorax or pleural effusion.
IMPRESSION: Cardiomegaly and bibasilar atelectasis.

## 2010-01-19 IMAGING — CR DG THORACIC SPINE 2V
3 series · 3 of 3 positions shown · non-contrast
Comparison: [DATE]

CLINICAL DATA: Chest pain

THORACIC SPINE - 2 VIEW

[t t-spine a.p.]
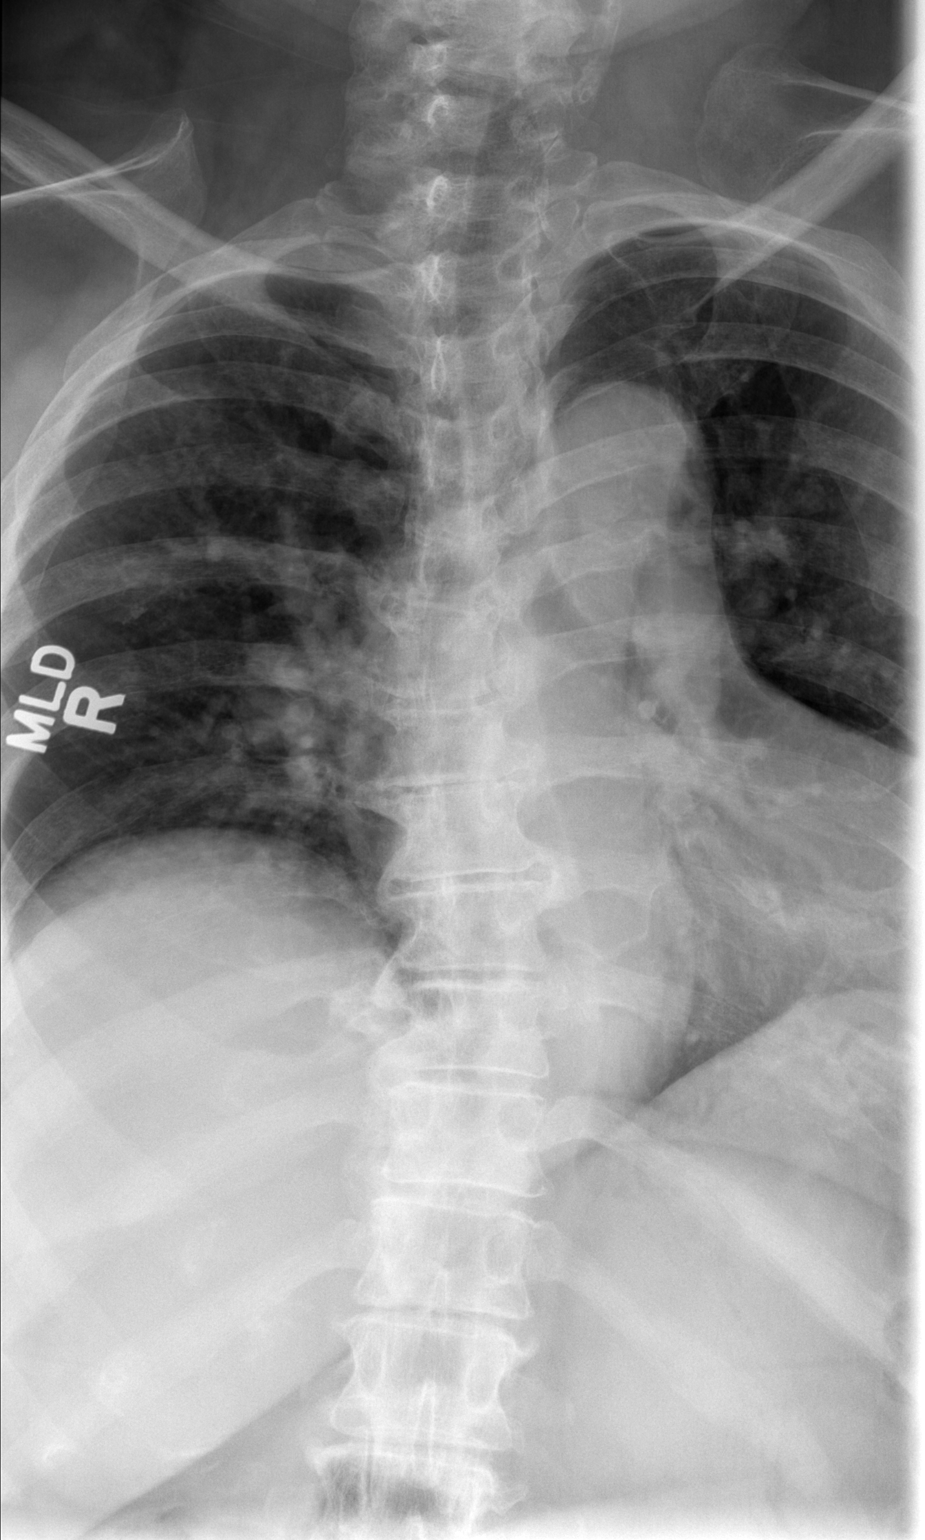

[t t-spine lat]
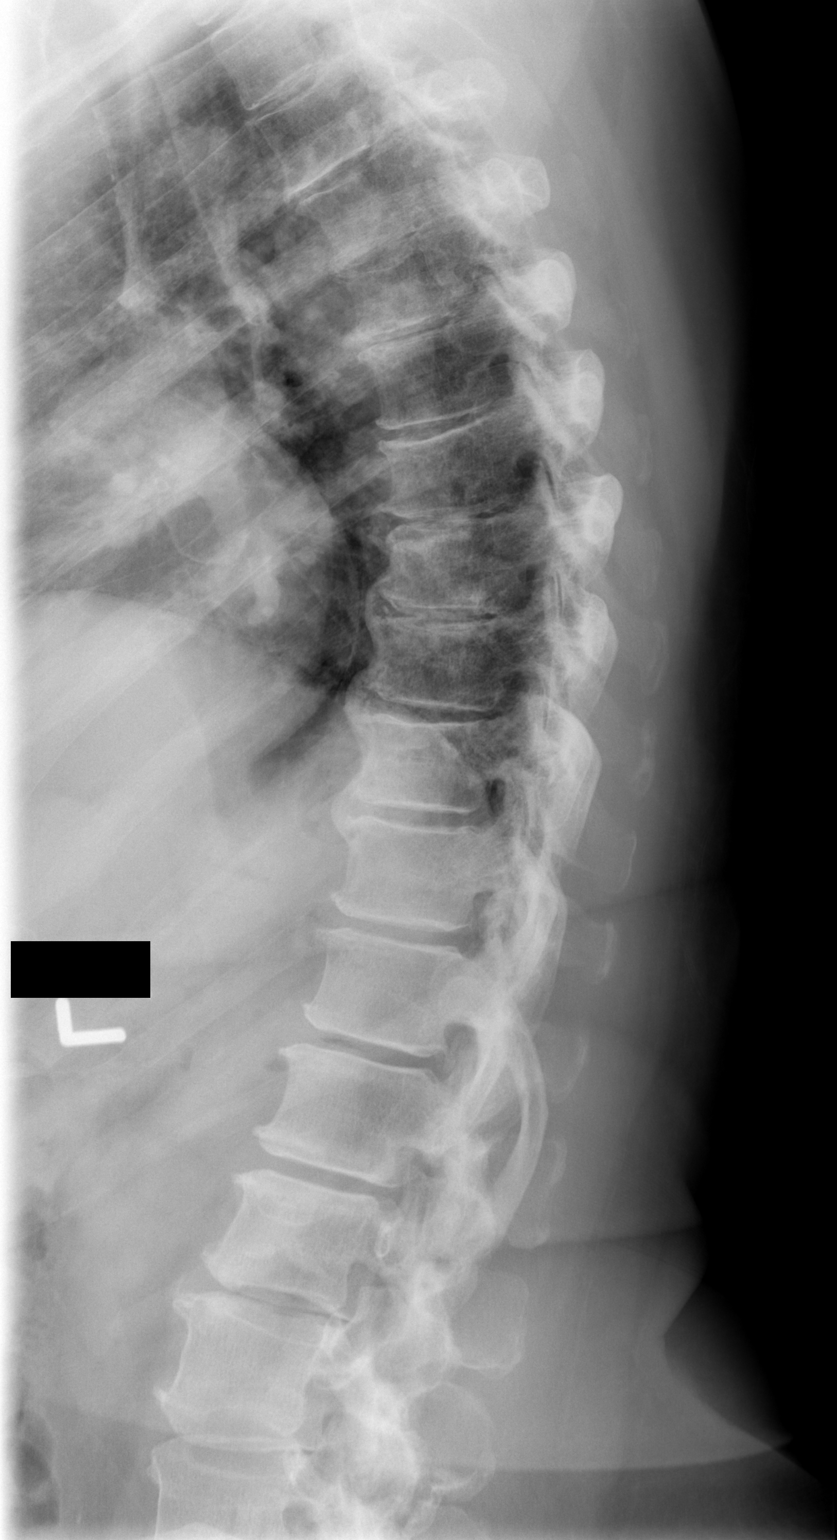

[t swimmers *]
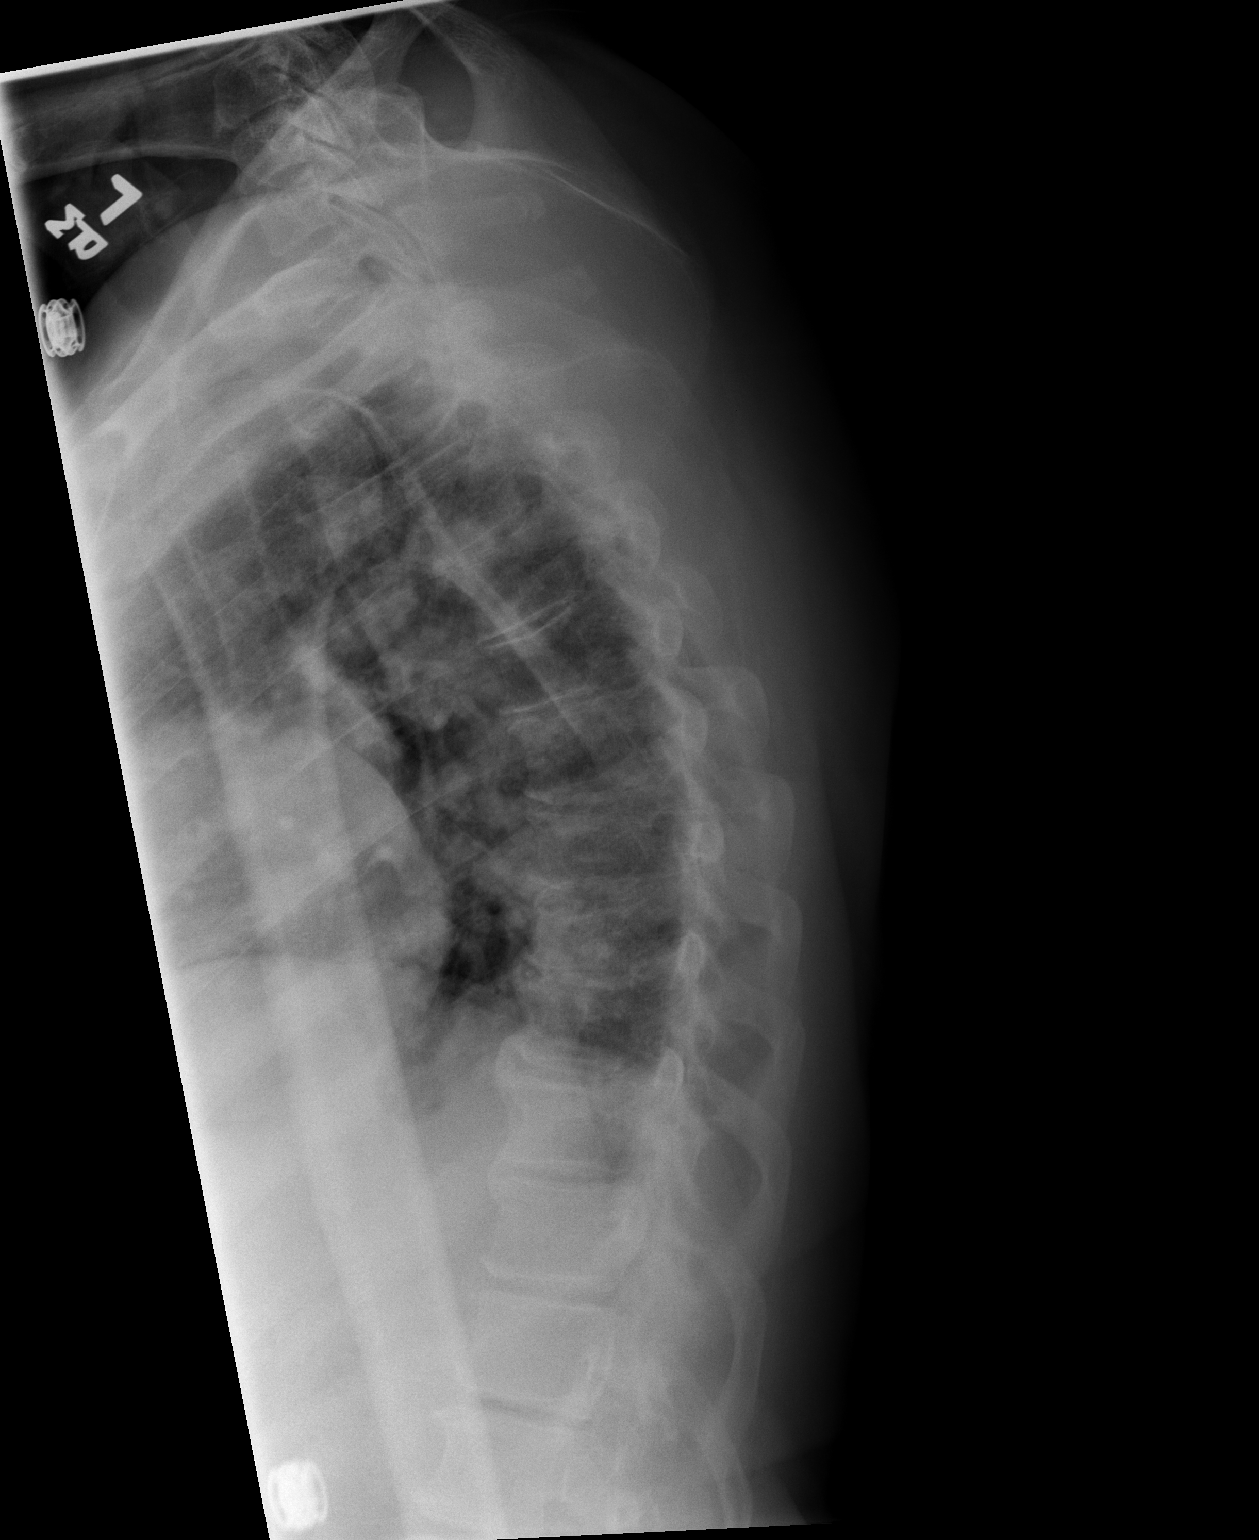

[3 of 3 positions shown; findings below may reference images not displayed]

FINDINGS: Mild scoliosis in the thoracic spine.  Stable mild
compression deformities.  No new compression deformities.  Diffuse
degenerative changes.
IMPRESSION: No acute bony pathology.

## 2010-01-19 IMAGING — CR DG LUMBAR SPINE COMPLETE 4+V
6 series · 6 of 6 positions shown · non-contrast
Comparison: None.

CLINICAL DATA: Nausea and vomiting.  Back pain

LUMBAR SPINE - COMPLETE 4+ VIEW

[t l-spine a.p.]
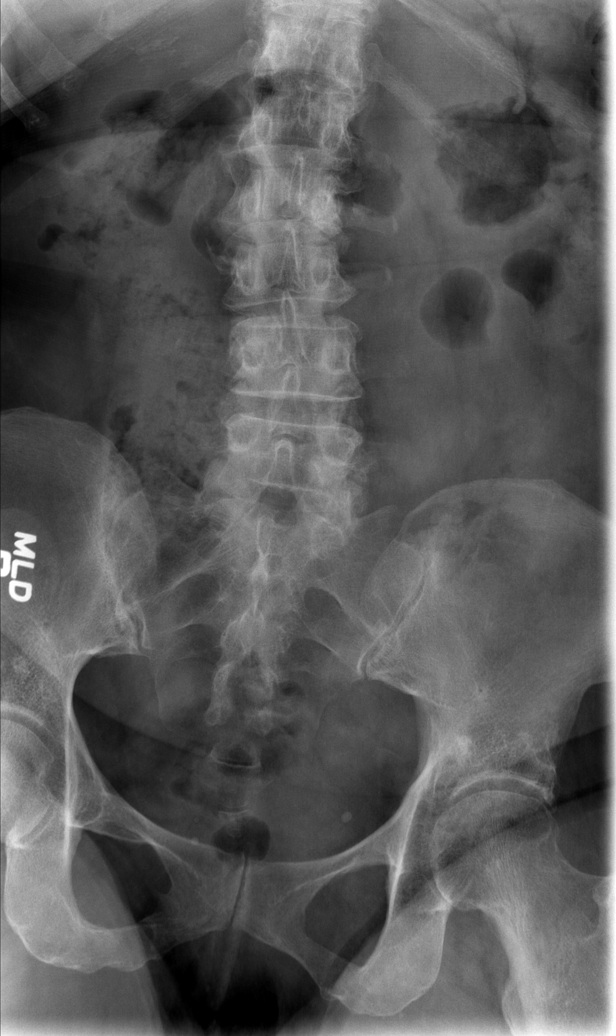

[t l-spine oblique exposure (1 of 2)]
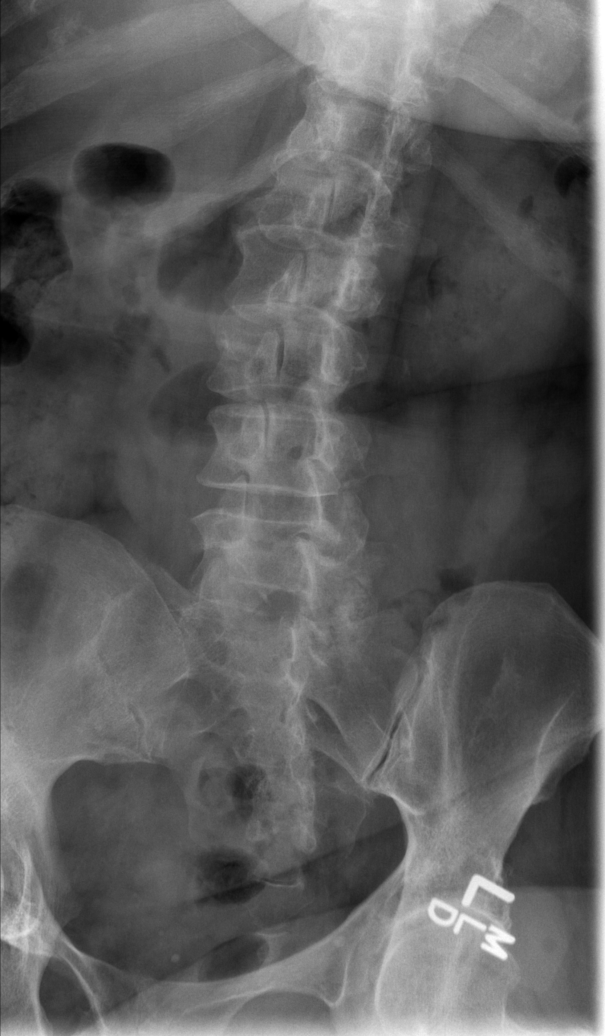

[t l-spine oblique exposure (2 of 2)]
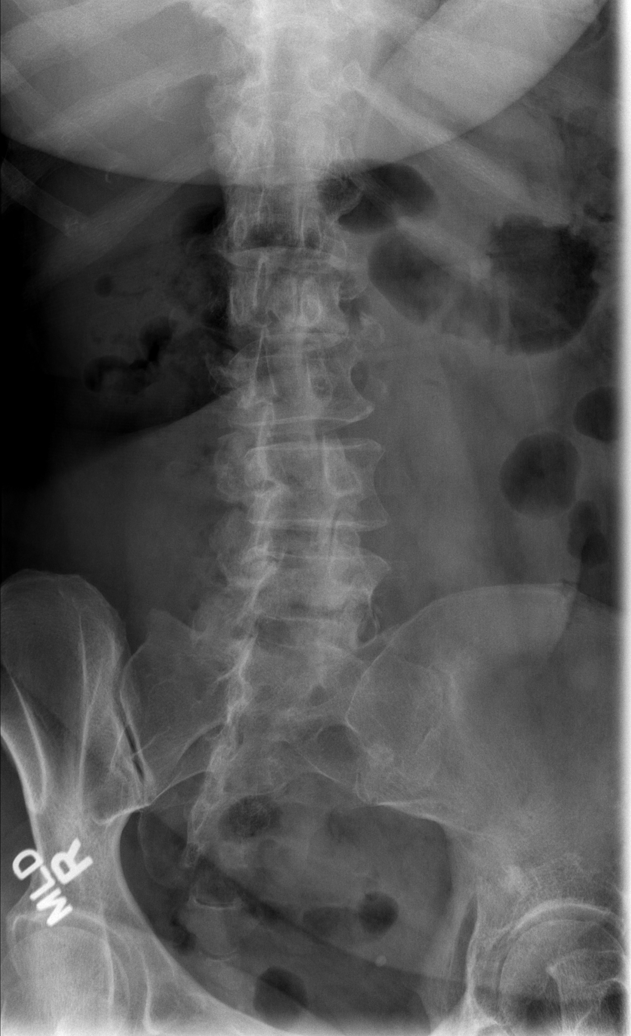

[t l-spine lat]
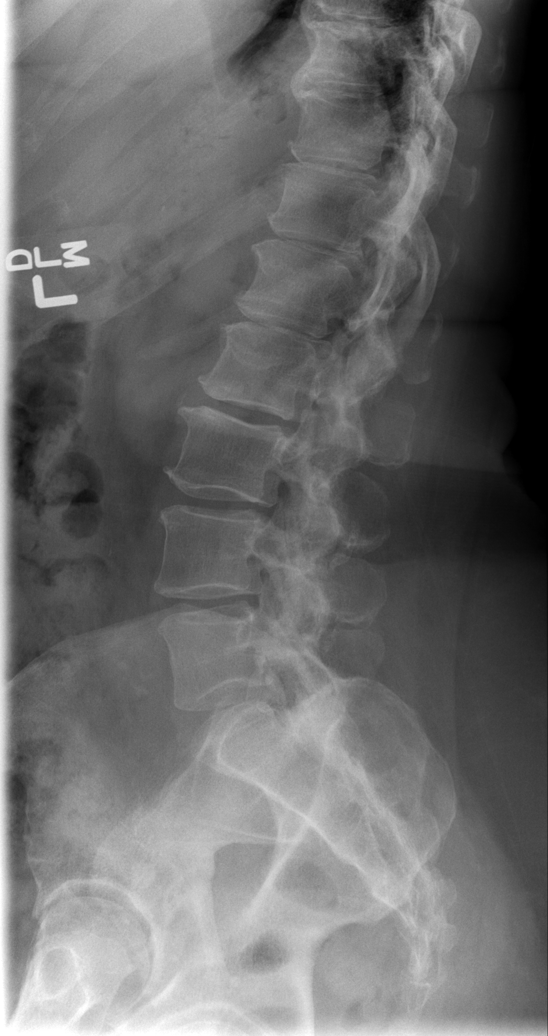

[t l-spine l5-s1 spot (1 of 2)]
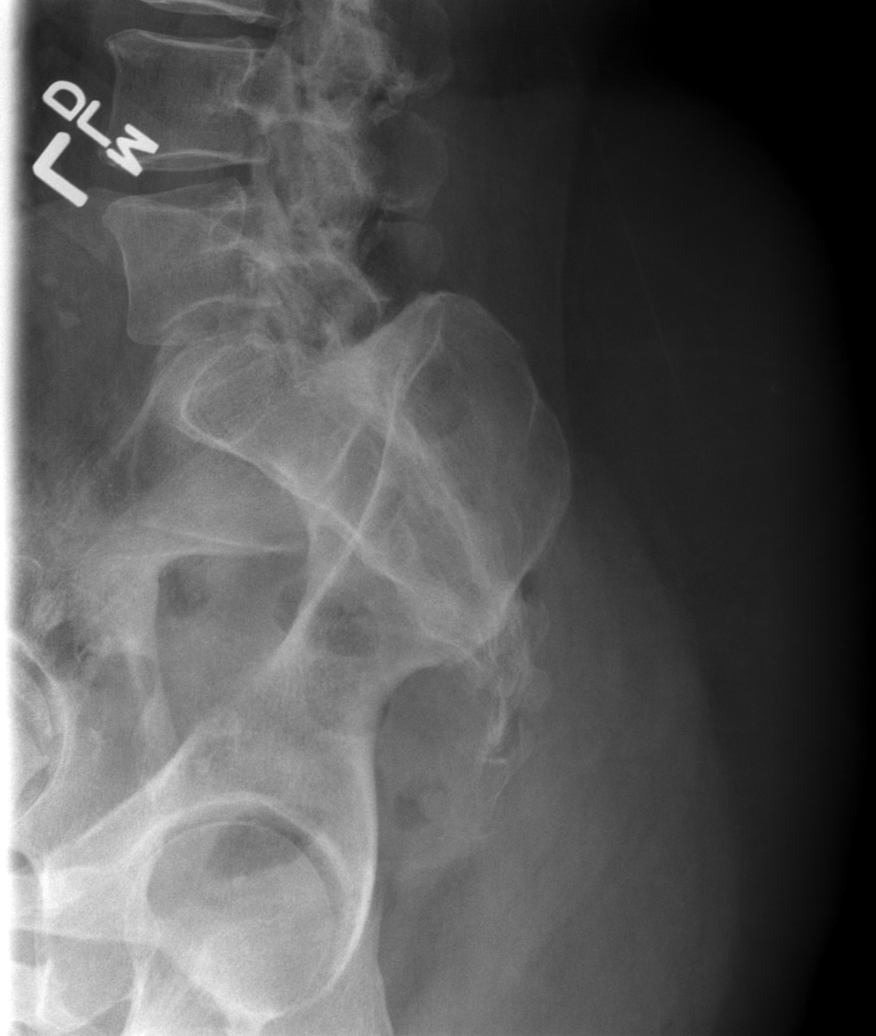

[t l-spine l5-s1 spot (2 of 2)]
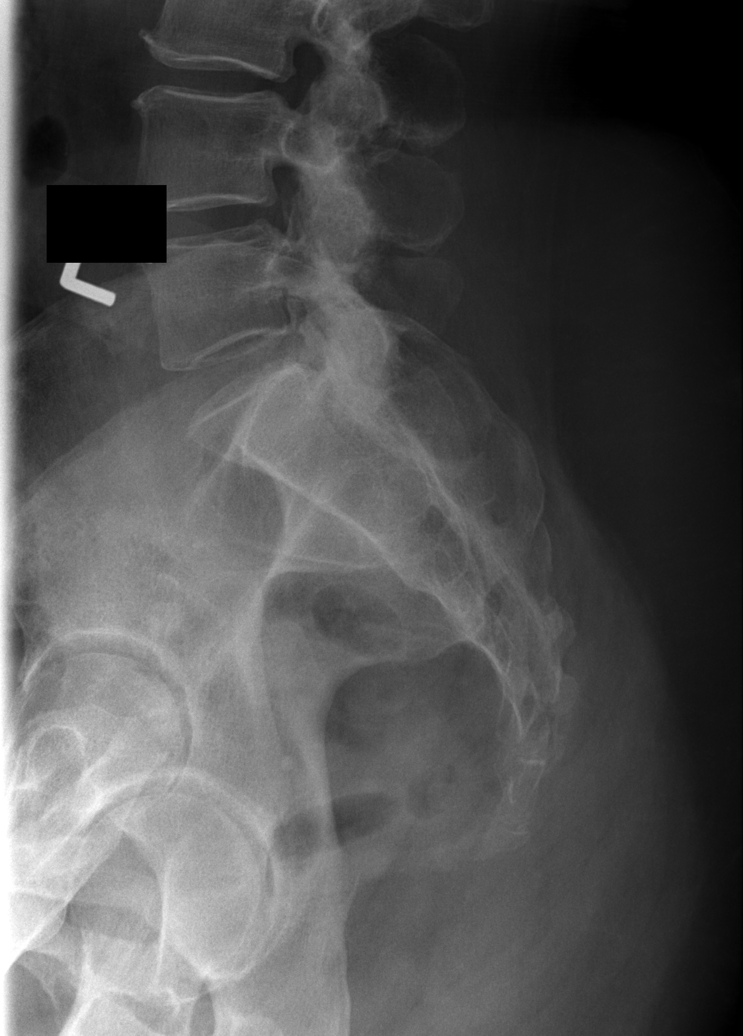

[6 of 6 positions shown; findings below may reference images not displayed]

FINDINGS: Slight levoscoliosis at L4-5.  No vertebral body height
loss.  Diffuse degenerative changes.
IMPRESSION: No acute bony pathology.  Chronic changes.

## 2010-01-21 ENCOUNTER — Telehealth: Payer: Self-pay | Admitting: Internal Medicine

## 2010-01-22 ENCOUNTER — Ambulatory Visit: Payer: Self-pay | Admitting: Internal Medicine

## 2010-02-06 ENCOUNTER — Telehealth: Payer: Self-pay | Admitting: Internal Medicine

## 2010-02-10 LAB — CONVERTED CEMR LAB
AST: 28 units/L (ref 0–37)
Albumin: 3.6 g/dL (ref 3.5–5.2)
Alkaline Phosphatase: 63 units/L (ref 39–117)
Basophils Relative: 0.2 % (ref 0.0–3.0)
Bilirubin, Direct: 0.1 mg/dL (ref 0.0–0.3)
Blood, UA: NEGATIVE
CO2: 34 meq/L — ABNORMAL HIGH (ref 19–32)
Calcium: 9.5 mg/dL (ref 8.4–10.5)
Eosinophils Absolute: 0.2 10*3/uL (ref 0.0–0.7)
Eosinophils Relative: 1.2 % (ref 0.0–5.0)
GFR calc non Af Amer: 82.08 mL/min (ref >60.00)
Glucose, Bld: 99 mg/dL (ref 70–99)
HDL: 85.1 mg/dL (ref >39.00)
Hgb A1c MFr Bld: 6.4 % (ref 4.6–6.5)
Leukocytes, UA: NEGATIVE
Lymphocytes Relative: 30 % (ref 12.0–46.0)
Monocytes Absolute: 1.2 10*3/uL — ABNORMAL HIGH (ref 0.1–1.0)
Neutrophils Relative %: 59.8 % (ref 43.0–77.0)
Nitrite: NEGATIVE
Platelets: 357 10*3/uL (ref 150.0–400.0)
Potassium: 4.2 meq/L (ref 3.5–5.1)
RBC: 4.19 M/uL (ref 3.87–5.11)
Sed Rate: 14 mm/hr (ref 0–22)
Sodium: 141 meq/L (ref 135–145)
Specific Gravity, Urine: 1.025 (ref 1.000–1.030)
TSH: 0.75 microintl units/mL (ref 0.35–5.50)
Urine Glucose: NEGATIVE mg/dL
Urobilinogen, UA: 0.2 (ref 0.0–1.0)
VLDL: 17.6 mg/dL (ref 0.0–40.0)
WBC: 13.6 10*3/uL — ABNORMAL HIGH (ref 4.5–10.5)

## 2010-02-11 ENCOUNTER — Ambulatory Visit: Payer: Self-pay | Admitting: Internal Medicine

## 2010-02-14 ENCOUNTER — Ambulatory Visit: Payer: Self-pay | Admitting: Internal Medicine

## 2010-02-14 DIAGNOSIS — G4733 Obstructive sleep apnea (adult) (pediatric): Secondary | ICD-10-CM

## 2010-02-14 DIAGNOSIS — R61 Generalized hyperhidrosis: Secondary | ICD-10-CM

## 2010-02-14 HISTORY — DX: Obstructive sleep apnea (adult) (pediatric): G47.33

## 2010-02-27 ENCOUNTER — Encounter: Payer: Self-pay | Admitting: Internal Medicine

## 2010-03-10 ENCOUNTER — Telehealth: Payer: Self-pay | Admitting: Internal Medicine

## 2010-03-27 NOTE — Miscellaneous (Signed)
  Clinical Lists Changes  Orders: Added new Service order of Depo- Medrol 40mg  (J1030) - Signed Added new Service order of Admin of Therapeutic Inj  intramuscular or subcutaneous (16109) - Signed      Medication Administration  Injection # 1:    Medication: Depo- Medrol 40mg     Diagnosis: OTHER MALAISE AND FATIGUE (ICD-780.79)    Route: IM    Site: LUOQ gluteus    Exp Date: 05/2012    Lot #: [id number]    Mfr: [employer]    Comments: pt rec 120mg     Patient tolerated injection without complications    Given by: Lanier Prude, CMA(AAMA) (October 10, 2009 1:53 PM)  Injection # 2:    Medication: Depo- Medrol 80mg     Diagnosis: OTHER MALAISE AND FATIGUE (ICD-780.79)    Comments: same as above  Orders Added: 1)  Depo- Medrol 40mg  [J1030] 2)  Admin of Therapeutic Inj  intramuscular or subcutaneous [60454]

## 2010-03-27 NOTE — Assessment & Plan Note (Signed)
Summary: cough - not better/cd   Vital Signs:  Patient profile:   65 year old female Weight:      191 pounds Temp:     99.9 degrees F oral Pulse rate:   106 / minute BP sitting:   116 / 84  (left arm)  Vitals Entered By: Tora Perches (April 05, 2009 3:48 PM) CC: cough Is Patient Diabetic? No   CC:  cough.  History of Present Illness: The patient presents with complaints of sore throat, fever, cough, sinus congestion and drainge of several days duration. Not better with OTC meds. Chest hurts with coughing. Can't sleep due to cough. Muscle aches are present.  The mucus is colored.   Preventive Screening-Counseling & Management  Alcohol-Tobacco     Smoking Status: never  Current Medications (verified): 1)  Alprazolam 0.5 Mg  Tabs (Alprazolam) .[name].. 1 By Mouth Two Times A Day As Needed  Anxiety 2)  Zyrtec Allergy 10 Mg  Tabs (Cetirizine Hcl) .... Take 1 By Mouth Qd 3)  Advair Diskus 250-50 Mcg/dose  Misc (Fluticasone-Salmeterol) .... Use Prn 4)  Verapamil Hcl Cr 240 Mg  Tbcr (Verapamil Hcl) .... Take 1 By Mouth Qd 5)  Vitamin D3 1000 Unit  Tabs (Cholecalciferol) .... Take 2 By Mouth Qd 6)  Tramadol Hcl 50 Mg  Tabs (Tramadol Hcl) .[name].. 1or2 Two Times A Day  Prn 7)  Methocarbamol 500 Mg Tabs (Methocarbamol) .[name].. 1 By Mouth Two Times A Day As Needed Spasm 8)  Vicodin 5-500 Mg Tabs (Hydrocodone-Acetaminophen) .[name].. 1 By Mouth Two Times A Day Severe Pain Not Relieved With Tramadol 9)  Fluocinonide 0.05 %  Crea (Fluocinonide) .... Use Two Times A Day As Needed 10)  Promethazine Hcl 25 Mg  Tabs (Promethazine Hcl) .[name].. 1-2 By Mouth Qid As Needed Nausea 11)  Augmentin 875-125 Mg Tabs (Amoxicillin-Pot Clavulanate) .[name].. 1 By Mouth Two Times A Day 12)  Meclizine Hcl 25 Mg Tabs (Meclizine Hcl) .... 1/2 To 1 Three Times A Day As Needed Dizzyness 13)  Divigel 1 Mg/gm Gel (Estradiol) .... Once Daily 14)  Hydrochlorothiazide 12.5 Mg  Tabs (Hydrochlorothiazide) .... Take 1 Tab  By Mouth Every  Morning 15)  Omeprazole 20 Mg Cpdr (Omeprazole) .... Take 1 By Mouth Once Daily 16)  Paroxetine Hcl 10 Mg Tabs (Paroxetine Hcl) .... 2  By Mouth Daily 17)  Tussionex Pennkinetic Er 8-10 Mg/45ml Lqcr (Chlorpheniramine-Hydrocodone) .... 5 Ml By Mouth Two Times A Day Prn  Allergies: 1)  ! Aspirin 2)  ! 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   Nose:  External nasal examination shows no deformity  Mouth:  Erythematous throat mucosa and intranasal erythema.  Neck:  No deformities, masses, or tenderness noted. Lungs:  Normal respiratory effort, chest expands symmetrically. Lungs are clear to auscultation, no crackles or wheezes. Heart:  Normal rate and regular rhythm. S1 and S2 normal without gallop, murmur, click, rub or other extra sounds. Abdomen:  Bowel sounds positive,abdomen soft and non-tender without masses, organomegaly or hernias noted. Msk:  No deformity or scoliosis noted of thoracic or lumbar spine.   Neurologic:  No cranial nerve deficits noted. Station and gait  are normal. Plantar reflexes are down-going bilaterally. DTRs are symmetrical throughout. Sensory, motor and coordinative functions appear intact.   Impression & Recommendations:  Problem # 1:  BRONCHITIS, ACUTE (ICD-466.0) Assessment New  Her updated medication list for this problem includes:    Advair Diskus 250-50 Mcg/dose Misc (Fluticasone-salmeterol) ..... Use 1 bid    Augmentin 875-125 Mg Tabs (Amoxicillin-pot clavulanate) .[name]... 1 by mouth two times a day     Tussionex Pennkinetic Er 8-10 Mg/87ml Lqcr (Chlorpheniramine-hydrocodone) .Marland KitchenMarland KitchenMarland KitchenMarland Kitchen 5 ml by mouth two times a day prn    Zithromax Z-pak 250 Mg Tabs (Azithromycin) .[name]... As dirrected  Problem # 2:  ASTHMA (ICD-493.90) Assessment: Deteriorated  Her updated medication list for this problem includes:    Advair Diskus 250-50 Mcg/dose Misc (Fluticasone-salmeterol) ..... Use 1 bid    Prednisone 10 Mg Tabs (Prednisone) .[name]... Take 40mg  qd for 3 days, then 20 mg qd for 3 days, then 10mg  qd for 6 days, then stop. take pc.  Complete Medication List: 1)  Alprazolam 0.5 Mg Tabs (Alprazolam) .[name].. 1 by mouth two times a day as needed  anxiety 2)  Zyrtec Allergy 10 Mg Tabs (Cetirizine hcl) .... Take 1 by mouth qd 3)  Advair Diskus 250-50 Mcg/dose Misc (Fluticasone-salmeterol) .... Use 1 bid 4)  Verapamil Hcl Cr 240 Mg Tbcr (Verapamil hcl) .... Take 1 by mouth qd 5)  Vitamin D3 1000 Unit Tabs (Cholecalciferol) .... Take 2 by mouth qd 6)  Tramadol Hcl 50 Mg Tabs (Tramadol hcl) .[name].. 1or2 two times a day  prn 7)  Methocarbamol 500 Mg Tabs (Methocarbamol) .[name].. 1 by mouth two times a day as needed spasm 8)  Vicodin 5-500 Mg Tabs (Hydrocodone-acetaminophen) .[name].. 1 by mouth two times a day severe pain not relieved with tramadol 9)  Fluocinonide 0.05 % Crea (Fluocinonide) .... Use two times a day as needed 10)  Promethazine Hcl 25 Mg Tabs (Promethazine hcl) .[name].. 1-2 by mouth qid as needed nausea 11)  Augmentin 875-125 Mg Tabs (Amoxicillin-pot clavulanate) .[name].. 1 by mouth two times a day 12)  Meclizine Hcl 25 Mg Tabs (Meclizine hcl) .... 1/2 to 1 three times a day as needed dizzyness 13)  Divigel 1 Mg/gm Gel (Estradiol) .... Once daily 14)  Hydrochlorothiazide 12.5 Mg Tabs (Hydrochlorothiazide) .... Take 1 tab  by mouth every morning 15)  Omeprazole 20 Mg Cpdr (Omeprazole) .... Take 1 by mouth once daily 16)  Paroxetine Hcl 10 Mg Tabs (Paroxetine hcl) .... 2  by mouth daily 17)  Tussionex Pennkinetic Er 8-10 Mg/59ml Lqcr  (Chlorpheniramine-hydrocodone) .... 5 ml by mouth two times a day prn 18)  Zithromax Z-pak 250 Mg Tabs (Azithromycin) .... As dirrected 19)  Prednisone 10 Mg Tabs (Prednisone) .... Take 40mg  qd for 3 days, then 20 mg qd for 3 days, then 10mg  qd for 6 days, then stop. take pc.  Patient Instructions: 1)  Use over-the-counter medicines for "cold": Tylenol  650mg  or Advil 400mg  every 6 hours  for fever; Delsym or Robutussin for cough. Mucinex or Mucinex D for congestion. Ricola or Halls for sore throat. Office visit if not better or if worse.  Prescriptions: PREDNISONE 10 MG TABS (PREDNISONE) Take 40mg  qd for 3 days, then 20 mg qd for 3 days, then 10mg  qd for 6 days, then stop. Take pc.  #24 x 1   Entered and Authorized by:   Tresa Garter MD   Signed by:   Tresa Garter MD on 04/05/2009   Method used:  Print then Give to Patient   RxID:   [id number] ZITHROMAX Z-PAK 250 MG TABS (AZITHROMYCIN) as dirrected  #1 x 0   Entered and Authorized by:   Tresa Garter MD   Signed by:   Tresa Garter MD on 04/05/2009   Method used:   Print then Give to Patient   RxID:   [id number] ADVAIR DISKUS 250-50 MCG/DOSE  MISC (FLUTICASONE-SALMETEROL) use 1 bid  #1 x 12   Entered and Authorized by:   Tresa Garter MD   Signed by:   Tresa Garter MD on 04/05/2009   Method used:   Print then Give to Patient   RxID:   [id number]

## 2010-03-27 NOTE — Progress Notes (Signed)
Summary: REFILL/ PLOT PT  Phone Note Call from Patient   Summary of Call: Patient is requesting refill of diclofenac 75mg . She was on this in the past from ortho but rx is expired and she wants Dr Posey Rea to refill. OK?  Initial call taken by: Lamar Sprinkles, CMA,  March 10, 2010 9:51 AM  Follow-up for Phone Call        ok Follow-up by: Etta Grandchild MD,  March 10, 2010 9:53 AM  Additional Follow-up for Phone Call Additional follow up Details #1::        Pt informed  Additional Follow-up by: Lamar Sprinkles, CMA,  March 10, 2010 3:26 PM    New/Updated Medications: DICLOFENAC SODIUM 75 MG TBEC (DICLOFENAC SODIUM) 1 once daily Prescriptions: DICLOFENAC SODIUM 75 MG TBEC (DICLOFENAC SODIUM) 1 once daily  #90 x 1   Entered by:   Lamar Sprinkles, CMA   Authorized by:   Tresa Garter MD   Signed by:   Lamar Sprinkles, CMA on 03/10/2010   Method used:   Electronically to        [facility]  [id number]       [address]       [place], [place]  [address]       Ph: [phone] or [phone]       Fax: [phone]   RxID:   [id number]

## 2010-03-27 NOTE — Progress Notes (Signed)
Summary: REFILL  Phone Note Refill Request Call back at Home Phone [phone]   Refills Requested: Medication #1:  VICODIN 5-500 MG TABS 1 by mouth two times a day severe pain not relieved with Tramadol Initial call taken by: Lamar Sprinkles, CMA,  April 30, 2009 9:56 AM  Follow-up for Phone Call        ok to ref Follow-up by: Tresa Garter MD,  April 30, 2009 11:55 AM  Additional Follow-up for Phone Call Additional follow up Details #1::        daughter inforemed, rx sent to pharmacy Additional Follow-up by: Sydell Axon,  April 30, 2009 1:36 PM    Prescriptions: VICODIN 5-500 MG TABS (HYDROCODONE-ACETAMINOPHEN) 1 by mouth two times a day severe pain not relieved with Tramadol  #120 x 1   Entered by:   Lamar Sprinkles, CMA   Authorized by:   Tresa Garter MD   Signed by:   Lamar Sprinkles, CMA on 04/30/2009   Method used:   Telephoned to ...       [id number]       [address]       [place], [place]  [address]       Ph: [phone] or [phone]       Fax: [phone]   RxID:   [id number]

## 2010-03-27 NOTE — Assessment & Plan Note (Signed)
Summary: per phone note needs ov,#/cd   Vital Signs:  Patient profile:   65 year old female Height:      61 inches Weight:      198 pounds BMI:     37.55 Temp:     98.2 degrees F oral Pulse rate:   92 / minute Pulse rhythm:   regular Resp:     16 per minute BP sitting:   150 / 100  (left arm) Cuff size:   large  Vitals Entered By: Lanier Prude, CMA(AAMA) (February 14, 2010 11:08 AM) CC: f/u c/o low energy "doesnt feel good" Is Patient Diabetic? No   CC:  f/u c/o low energy "doesnt feel good".  History of Present Illness: C/o being  fatigued  C/o sweating  since summer LBP is much better F/u HTN  Current Medications (verified): 1)  Alprazolam 0.5 Mg  Tabs (Alprazolam) .[name].. 1 By Mouth Two Times A Day As Needed  Anxiety 2)  Zyrtec Allergy 10 Mg  Tabs (Cetirizine Hcl) .... Take 1 By Mouth Qd 3)  Advair Diskus 250-50 Mcg/dose  Misc (Fluticasone-Salmeterol) .... Use 1 Bid 4)  Verapamil Hcl Cr 240 Mg  Tbcr (Verapamil Hcl) .... Take 1 By Mouth Qd 5)  Vitamin D3 1000 Unit  Tabs (Cholecalciferol) .... Take 2 By Mouth Qd 6)  Tramadol Hcl 50 Mg  Tabs (Tramadol Hcl) .[name].. 1or2 Two Times A Day  Prn 7)  Methocarbamol 500 Mg Tabs (Methocarbamol) .[name].. 1 By Mouth Two Times A Day As Needed Spasm 8)  Omeprazole 20 Mg Cpdr (Omeprazole) .... Take 1 By Mouth Once Daily 9)  Paroxetine Hcl 10 Mg Tabs (Paroxetine Hcl) .... 2  By Mouth Daily 10)  Cefuroxime Axetil 250 Mg Tabs (Cefuroxime Axetil) .[name].. 1 Tab Two Times A Day 11)  Cyclobenzaprine Hcl 10 Mg Tabs (Cyclobenzaprine Hcl) .[name].. 1 Tab By Mouth At Bedtime As Needed Muscle Spasm 12)  Hydromorphone Hcl 2 Mg Tabs (Hydromorphone Hcl) .[name].. 1-2 By Mouth Three Times A Day As Needed Pain  Allergies (verified): 1)  ! Aspirin 2)  ! 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    Physical Exam  General:  obese appearing female NAD Ears:  WNL Mouth:  WNL Lungs:  Normal respiratory effort, chest expands symmetrically. Lungs are clear to auscultation, no crackles or wheezes. Heart:  Normal rate and regular rhythm. S1 and S2 normal without gallop, murmur, click, rub or other extra sounds. Abdomen:  Bowel sounds positive,abdomen soft and non-tender without masses, organomegaly or hernias noted. Msk:  Lumbar-sacral spine isa little tender to palpation over paraspinal muscles and painfull with the ROM  Neurologic:  Strait leg elev neg B Skin:  Intact without suspicious lesions or rashes Psych:  Cognition and judgment appear intact. Alert and cooperative with normal attention span and concentration. No apparent delusions, illusions, hallucinations   Impression & Recommendations:  Problem # 1:  SWEATING (ICD-780.8) poss due to menopause Assessment New The labs were reviewed with the patient.  Premarin to try. Risks vs benefits and controversies of a long term HRT use were  discussed.   Problem # 2:  BACK STRAIN, LUMBAR (ICD-847.2) Assessment: Improved On the regimen of medicine(s) reflected in the chart    Problem # 3:  DEPRESSION (ICD-311) Assessment: Improved  Her updated medication list for this problem includes:    Alprazolam 0.5 Mg Tabs (Alprazolam) .[name]... 1 by mouth two times a day as needed  anxiety    Paroxetine Hcl 10 Mg Tabs (Paroxetine hcl) .[name]... 2  by mouth daily  Problem # 4:  HYPERTENSION (ICD-401.9) Assessment: Deteriorated Risks of noncompliance with treatment discussed. Compliance encouraged.  Her  updated medication list for this problem includes:    Verapamil Hcl Cr 240 Mg Tbcr (Verapamil hcl) .[name]... Take 1 by mouth qd  BP today: 150/100 Prior BP: 130/90 (01/22/2010)  Labs Reviewed: K+: 4.2 (02/10/2010) Creat: : 0.9 (02/10/2010)   Chol: 220 (02/10/2010)   HDL: 85.10 (02/10/2010)   LDL: DEL (08/04/2006)   TG: 88.0 (02/10/2010)  Problem # 5:  FATIGUE (ICD-780.79) Assessment: Deteriorated Treat #6 The labs were reviewed with the patient.   Problem # 6:  OBSTRUCTIVE SLEEP APNEA (ICD-327.23) Assessment: Deteriorated Restart CPAP  Complete Medication List: 1)  Alprazolam 0.5 Mg Tabs (Alprazolam) .[name].. 1 by mouth two times a day as needed  anxiety 2)  Zyrtec Allergy 10 Mg Tabs (Cetirizine hcl) .... Take 1 by mouth qd 3)  Advair Diskus 250-50 Mcg/dose Misc (Fluticasone-salmeterol) .... Use 1 bid 4)  Verapamil Hcl Cr 240 Mg Tbcr (Verapamil hcl) .... Take 1 by mouth qd 5)  Vitamin D3 1000 Unit Tabs (Cholecalciferol) .... Take 2 by mouth qd 6)  Tramadol Hcl 50 Mg Tabs (Tramadol hcl) .[name].. 1or2 two times a day  prn 7)  Methocarbamol 500 Mg Tabs (Methocarbamol) .[name].. 1 by mouth two times a day as needed spasm 8)  Omeprazole 20 Mg Cpdr (Omeprazole) .... Take 1 by mouth once daily 9)  Paroxetine Hcl 10 Mg Tabs (Paroxetine hcl) .... 2  by mouth daily 10)  Premarin 0.625 Mg Tabs (Estrogens conjugated) .[name].. 1 by mouth once daily for sweats 11)  Cpap Mask and Supples  .... As dirr dx osa  Patient Instructions: 1)  Please schedule a follow-up appointment in 3 months. 2)  BMP prior to visit, ICD-9: 3)  CBC w/ Diff prior to visit, ICD-9:995.20 Prescriptions: CPAP MASK AND SUPPLES as dirr Dx OSA  #qs x 1   Entered and Authorized by:   Tresa Garter MD   Signed by:   Tresa Garter MD on 02/14/2010   Method used:   Print then Give to Patient   RxID:   [id number] METHOCARBAMOL 500 MG TABS (METHOCARBAMOL) 1 by mouth two times a day as needed spasm  #60 x 3   Entered and  Authorized by:   Tresa Garter MD   Signed by:   Tresa Garter MD on 02/14/2010   Method used:   [facility] to        [facility]  [id number]       [address]       [place], [place]  [address]       Ph: [phone] or [phone]       Fax: [phone]   RxID:   [id number] PAROXETINE HCL 10 MG TABS (PAROXETINE HCL) 2  by mouth daily  #60 x 6   Entered and Authorized by:   Tresa Garter MD   Signed by:   Georgina Quint Plotnikov  MD on 02/14/2010   Method used:   [facility]        [facility]  [id number]       [address]       [place], [place]  [address]       Ph: [phone] or [phone]       Fax: [phone]   RxID:   [id number] PREMARIN 0.625 MG TABS (ESTROGENS CONJUGATED) 1 by mouth once daily for sweats  #30 x 6   Entered and Authorized by:   Tresa Garter MD   Signed by:   Tresa Garter MD on 02/14/2010   Method used:   [facility] to        [id number]       [address]       [place], [place]  [address]       Ph: [phone] or [phone]       Fax: [phone]   RxID:   [id number]    Orders Added: 1)  Est. Patient Level IV [70350]

## 2010-03-27 NOTE — Progress Notes (Signed)
Summary: PAIN   Phone Note Call from Patient   Summary of Call: Pt is c/o pain, she went to ER this weekend. Oxycodone did not help pain. Is there another option? is a patch on her back possible? She is having trouble getting up and down and walking from pain.  Initial call taken by: Lamar Sprinkles, CMA,  January 21, 2010 12:49 PM  Follow-up for Phone Call        needs ov with any md  Follow-up by: Tresa Garter MD,  January 21, 2010 6:04 PM  Additional Follow-up for Phone Call Additional follow up Details #1::        Pt scheduled for today for eval  Additional Follow-up by: Lamar Sprinkles, CMA,  January 22, 2010 10:08 AM

## 2010-03-27 NOTE — Progress Notes (Signed)
Summary: Back pain  Phone Note Call from Patient   Summary of Call: Pt c/o increased pain in the left side of her upper back, especially when coughing. Cough med is only given little relief. She is very concerned. Please advise.  Initial call taken by: Lamar Sprinkles, CMA,  January 15, 2010 2:16 PM  Follow-up for Phone Call        OV on Fri if can wait or go to UC if bad. OK to try Vicodin prn Follow-up by: Tresa Garter MD,  January 15, 2010 5:22 PM  Additional Follow-up for Phone Call Additional follow up Details #1::        Pt informed  Additional Follow-up by: Lamar Sprinkles, CMA,  January 15, 2010 5:37 PM

## 2010-03-27 NOTE — Assessment & Plan Note (Signed)
Summary: FLU SYMPTOMS COUGHING/DLO   Vital Signs:  Patient profile:   65 year old female Height:      61 inches (154.94 cm) Weight:      195.50 pounds (88.86 kg) BMI:     37.07 O2 Sat:      94 % on Room air Temp:     98.6 degrees F (37.00 degrees C) oral Pulse rate:   94 / minute BP sitting:   140 / 80  (left arm) Cuff size:   large  Vitals Entered By: Lucious Groves CMA (January 17, 2010 11:13 AM)  O2 Flow:  Room air CC: Flu symptoms and left lower gluteal pain./kb Is Patient Diabetic? No Pain Assessment Patient in pain? yes     Location: buttock Intensity: 10 Type: sharp Onset of pain  2-3 days Comments Patient notes that she has been having the pain for 2-3 days, and she was seen earlier this week by Dr. Everardo All. She has been having fever, cough (no mucous production), congestion, SOB, and N&V./kb   History of Present Illness: 65 year old female seen 4 days ago by Dr. Everardo All.. Dx ith asthma exac and bronchitis. CXR Clear  Treated with :  1) medrol "dosepack" (steriods for your asthma). 2)  cefuroxine 250 mg two times a day (antibiotic) 3)  promethazine-dm (cough syrup)  These cough, shortness of breath and congestion has improved  in last 24 hours, but she is now experiencing left low back pain, very severe.  She had been having gradaully worsening pain in back x 1 week.  Radiates up left back/flank. Using heating pad..no relief. Pain worse with coughing.  Threw up medication this moring..coughing fit and nausea. No fever.  No falls. No new injuries.   No numbness or weakness in legs.  Does have  history of back problems but no  back surgery. sick.   Has methocarbamol in past ..out now...wasnt helping any way.   Was sing tramadol for knee pain.two times a day .Marland Kitchennot helping this issue..helps her knee s though.  Also on diclofenac thorugh ORTHO MD.  Told to try vicodin per Dr. Posey Rea  in last 2-3 days..not helping eiher.  Problems Prior to Update: 1)  Uri   (ICD-465.9) 2)  Dehydration  (ICD-276.51) 3)  Diarrhea, Acute  (ICD-787.91) 4)  Bronchitis, Acute  (ICD-466.0) 5)  Abnormal Glucose Nec  (ICD-790.29) 6)  Vertigo  (ICD-780.4) 7)  Otitis Media  (ICD-382.9) 8)  Vomiting  (ICD-787.03) 9)  Headache  (ICD-784.0) 10)  Rash and Other Nonspecific Skin Eruption  (ICD-782.1) 11)  Other Malaise and Fatigue  (ICD-780.79) 12)  Osteoarthritis  (ICD-715.90) 13)  Gerd  (ICD-530.81) 14)  Anxiety  (ICD-300.00) 15)  Depression  (ICD-311) 16)  Obesity, Morbid  (ICD-278.01) 17)  Urinary Incontinence, Overflow  (ICD-788.39) 18)  Migraine Headache  (ICD-346.90) 19)  Postherpetic Trigeminal Neuralgia  (ICD-053.12) 20)  Herpes Zoster  (ICD-053.9) 21)  Hypertension  (ICD-401.9) 22)  Asthma  (ICD-493.90)  Current Medications (verified): 1)  Alprazolam 0.5 Mg  Tabs (Alprazolam) .[name].. 1 By Mouth Two Times A Day As Needed  Anxiety 2)  Zyrtec Allergy 10 Mg  Tabs (Cetirizine Hcl) .... Take 1 By Mouth Qd 3)  Advair Diskus 250-50 Mcg/dose  Misc (Fluticasone-Salmeterol) .... Use 1 Bid 4)  Verapamil Hcl Cr 240 Mg  Tbcr (Verapamil Hcl) .... Take 1 By Mouth Qd 5)  Vitamin D3 1000 Unit  Tabs (Cholecalciferol) .... Take 2 By Mouth Qd 6)  Tramadol Hcl 50 Mg  Tabs (Tramadol Hcl) .[name].. 1or2 Two Times A Day  Prn 7)  Methocarbamol 500 Mg Tabs (Methocarbamol) .[name].. 1 By Mouth Two Times A Day As Needed Spasm 8)  Vicodin 5-500 Mg Tabs (Hydrocodone-Acetaminophen) .[name].. 1 By Mouth Two Times A Day Severe Pain Not Relieved With Tramadol 9)  Omeprazole 20 Mg Cpdr (Omeprazole) .... Take 1 By Mouth Once Daily 10)  Paroxetine Hcl 10 Mg Tabs (Paroxetine Hcl) .... 2  By Mouth Daily 11)  Medrol (Pak) 4 Mg Tabs (Methylprednisolone) .... As Dir 12)  Cefuroxime Axetil 250 Mg Tabs (Cefuroxime Axetil) .[name].. 1 Tab Two Times A Day 13)  Promethazine-Dm 6.25-15 Mg/53ml Syrp (Promethazine-Dm) .[name].. 1 Teaspoon Every 4 Hrs As Needed For Cough  Allergies (verified): 1)  ! Aspirin 2)  ! Ace Inhibitors 3)   Citalopram Hydrobromide (Citalopram Hydrobromide)  Past History:  Past medical, surgical, family and social histories (including risk factors) reviewed, and no changes noted (except as noted below).  Past Medical History: Reviewed history from 08/30/2007 and no changes required. Asthma Hypertension Anxiety Depression GERD Osteoarthritis B knee OA R>L B CTS R>L  Past Surgical History: Reviewed history from 01/26/2007 and no changes required. Hysterectomy Splenectomy 2 D Echo (06/08/1995) Stress Cardiolite (07/08/1995)  Family History: Reviewed history from 02/15/2007 and no changes required. Family History Hypertension DM  Social History: Reviewed history from 05/18/2007 and no changes required. Retired Single separated 2009 Never Smoked  Review of Systems General:  Denies fatigue and fever. CV:  Denies chest pain or discomfort. Resp:  Denies shortness of breath. GI:  Denies abdominal pain. GU:  Denies dysuria.  Physical Exam  General:  obese appearing female inNAD  Eyes:  No corneal or conjunctival inflammation noted. EOMI. Perrla. Funduscopic exam benign, without hemorrhages, exudates or papilledema. Vision grossly normal. Ears:  External ear exam shows no significant lesions or deformities.  Otoscopic examination reveals clear canals, tympanic membranes are intact bilaterally without bulging, retraction, inflammation or discharge. Hearing is grossly normal bilaterally. Nose:  nasal dischargemucosal pallor.   Mouth:  MMM Neck:  no carotid bruit or thyromegaly no cervical or supraclavicular lymphadenopathy  Lungs:  Normal respiratory effort, chest expands symmetrically. Lungs are clear to auscultation, no crackles or wheezes. Heart:  Normal rate and regular rhythm. S1 and S2 normal without gallop, murmur, click, rub or other extra sounds. Abdomen:  Bowel sounds positive,abdomen soft and non-tender without masses, organomegaly or hernias noted. Msk:  no vertebral  ttp, ttp diffuse left paraspinous muscle, neg SLR and Faber's Pulses:  R and L posterior tibial pulses are full and equal bilaterally  Extremities:  no edema Neurologic:  No cranial nerve deficits noted. Station and gait are normal. Plantar reflexes are down-going bilaterally. DTRs are symmetrical throughout. Sensory, motor and coordinative functions appear intact. Skin:  Intact without suspicious lesions or rashes Psych:  Cognition and judgment appear intact. Alert and cooperative with normal attention span and concentration. No apparent delusions, illusions, hallucinations   Impression & Recommendations:  Problem # 1:  BACK STRAIN, LUMBAR (ICD-847.2) Chronic back pain..flareed by muscle spsam from coughing likely.  On medrol for asthma..pnce finished change to diclofenac  for antinflammatory. Given severe pain...will cahnge vicodin to PErcocet for breakthrough pain. Restart muscle relaxant.  Given gentle stretching..likely would benefit from PT.  Follow up from primary MD.   Problem # 2:  BRONCHITIS, ACUTE (ICD-466.0) Improving.Complte antiboitic and steroid course.  Her updated medication list for this problem includes:    Advair Diskus 250-50 Mcg/dose Misc (Fluticasone-salmeterol) ..... Use  1 bid    Cefuroxime Axetil 250 Mg Tabs (Cefuroxime axetil) .[name]... 1 tab two times a day    Promethazine-dm 6.25-15 Mg/31ml Syrp (Promethazine-dm) .[name]... 1 teaspoon every 4 hrs as needed for cough  Complete Medication List: 1)  Alprazolam 0.5 Mg Tabs (Alprazolam) .[name].. 1 by mouth two times a day as needed  anxiety 2)  Zyrtec Allergy 10 Mg Tabs (Cetirizine hcl) .... Take 1 by mouth qd 3)  Advair Diskus 250-50 Mcg/dose Misc (Fluticasone-salmeterol) .... Use 1 bid 4)  Verapamil Hcl Cr 240 Mg Tbcr (Verapamil hcl) .... Take 1 by mouth qd 5)  Vitamin D3 1000 Unit Tabs (Cholecalciferol) .... Take 2 by mouth qd 6)  Tramadol Hcl 50 Mg Tabs (Tramadol hcl) .[name].. 1or2 two times a day  prn 7)  Methocarbamol 500 Mg  Tabs (Methocarbamol) .[name].. 1 by mouth two times a day as needed spasm 8)  Oxycodone-acetaminophen 5-325 Mg Tabs (Oxycodone-acetaminophen) 9)  Omeprazole 20 Mg Cpdr (Omeprazole) .... Take 1 by mouth once daily 10)  Paroxetine Hcl 10 Mg Tabs (Paroxetine hcl) .... 2  by mouth daily 11)  Medrol (pak) 4 Mg Tabs (Methylprednisolone) .... As dir 12)  Cefuroxime Axetil 250 Mg Tabs (Cefuroxime axetil) .[name].. 1 tab two times a day 13)  Promethazine-dm 6.25-15 Mg/61ml Syrp (Promethazine-dm) .[name].. 1 teaspoon every 4 hrs as needed for cough 14)  Diclofenac Sodium 75 Mg Tbec (Diclofenac sodium) .... Take 1 tablet by mouth every 12 hours as needed pain. 15)  Oxycodone-acetaminophen 5-325 Mg Tabs (Oxycodone-acetaminophen) .[name].. 1 tab by mouth two times a day as needed pain 16)  Cyclobenzaprine Hcl 10 Mg Tabs (Cyclobenzaprine hcl) .[name].. 1 tab by mouth at bedtime as needed muscle spasm  Patient Instructions: 1)  Apply heat to low back, gentle stretching exercises. 2)  Star on muscle relaxant at night time. 3)   Use percocet for break through pain. 4)  Hold  diclofenac til off medrol...then use diclofenac two times a day  5)   Hold tramadol and vicodin  if needing percocet.  6)  Follow up with primary MD in 1 week to considre referrral to PT. Prescriptions: CYCLOBENZAPRINE HCL 10 MG TABS (CYCLOBENZAPRINE HCL) 1 tab by mouth at bedtime as needed muscle spasm  #20 x 0   Entered and Authorized by:   Kerby Nora MD   Signed by:   Kerby Nora MD on 01/17/2010   Method used:   Print then Give to Patient   RxID:   [id number] OXYCODONE-ACETAMINOPHEN 5-325 MG TABS (OXYCODONE-ACETAMINOPHEN) 1 tab by mouth two times a day as needed pain  #30 x 0   Entered and Authorized by:   Kerby Nora MD   Signed by:   Kerby Nora MD on 01/17/2010   Method used:   Print then Give to Patient   RxID:   [id number]    Orders Added: 1)  Est. Patient Level IV [84696]     Orders Added: 1)  Est. Patient Level IV [29528]

## 2010-03-27 NOTE — Progress Notes (Signed)
Summary: Alprazolam refill  Phone Note Refill Request Message from:  Patient on May 02, 2009 1:50 PM  Refills Requested: Medication #1:  ALPRAZOLAM 0.5 MG  TABS 1 by mouth two times a day as needed  anxiety Initial call taken by: Lucious Groves,  May 02, 2009 1:50 PM  Follow-up for Phone Call        rx faxed to [facility] on [address] Follow-up by: Margaret Pyle, CMA,  May 03, 2009 9:03 AM    New/Updated Medications: ALPRAZOLAM 0.5 MG  TABS (ALPRAZOLAM) 1 by mouth two times a day as needed  anxiety Prescriptions: ALPRAZOLAM 0.5 MG  TABS (ALPRAZOLAM) 1 by mouth two times a day as needed  anxiety  #60 x 1   Entered and Authorized by:   Corwin Levins MD   Signed by:   Corwin Levins MD on 05/03/2009   Method used:   Print then Give to Patient   RxID:   [id number]  done hardcopy to [facility] side B - dahlia  Corwin Levins MD  May 03, 2009 8:46 AM

## 2010-03-27 NOTE — Progress Notes (Signed)
Summary: Tramadol  Phone Note Refill Request Message from:  Fax from Pharmacy on May 28, 2009 11:55 AM  Refills Requested: Medication #1:  TRAMADOL HCL 50 MG  TABS 1or2 two times a day  prn Next Appointment Scheduled: none Initial call taken by: Lucious Groves,  May 28, 2009 11:55 AM  Follow-up for Phone Call        OK 6 ref Follow-up by: Tresa Garter MD,  May 28, 2009 1:19 PM    Prescriptions: TRAMADOL HCL 50 MG  TABS (TRAMADOL HCL) 1or2 two times a day  prn  #120 x 1   Entered by:   Lamar Sprinkles, CMA   Authorized by:   Tresa Garter MD   Signed by:   Lamar Sprinkles, CMA on 05/28/2009   Method used:   [facility] to        [facility]  [address] [id number]       [address]       [place], [place]  [address]       Ph: [phone] or [phone]       Fax: [phone]   RxID:   [id number]

## 2010-03-27 NOTE — Progress Notes (Signed)
Summary: Cough - Req for rx  Phone Note Call from Patient Call back at Home Phone [phone]   Summary of Call: Patient is requesting rx of tussinex for cough. Pt c/o head/chest congestion and cough.  Initial call taken by: Lamar Sprinkles, CMA,  April 04, 2009 10:54 AM  Follow-up for Phone Call        ok OV if sick Follow-up by: Tresa Garter MD,  April 04, 2009 12:30 PM  Additional Follow-up for Phone Call Additional follow up Details #1::        Pt informed  Additional Follow-up by: Lamar Sprinkles, CMA,  April 04, 2009 5:06 PM    New/Updated Medications: Sandria Senter ER 8-10 MG/5ML LQCR (CHLORPHENIRAMINE-HYDROCODONE) 5 ml by mouth two times a day prn Prescriptions: TUSSIONEX PENNKINETIC ER 8-10 MG/5ML LQCR (CHLORPHENIRAMINE-HYDROCODONE) 5 ml by mouth two times a day prn  #100 ml x 0   Entered by:   Lamar Sprinkles, CMA   Authorized by:   Tresa Garter MD   Signed by:   Lamar Sprinkles, CMA on 04/04/2009   Method used:   Telephoned to ...       [facility]  [address] [id number]       [address]       [place], [place]  [address]       Ph: [phone] or [phone]       Fax: [phone]   RxID:   [id number]

## 2010-03-27 NOTE — Progress Notes (Signed)
Summary: Call Report  Phone Note Other Incoming   Caller: [facility] Summary of Call: [facility] Triage Call Report Triage Record Num: [id number] Operator: Craig Guess Patient Name: Mallory Klein Call Date & Time: 01/12/2010 10:37:47AM Patient Phone: [phone] PCP: Sonda Primes Patient Gender: Female PCP Fax : [phone] Patient DOB: 1945/08/18 Practice Name: [facility] Reason for Call: Caller reports a sore throat, ha and cough that began during the night on Friday. Caller asking if she can get a Zpack. She has used some home care but no pain meds at regular intervals. Dosing for Tylenol given. Caller advised she can not get any antibxs unless she is seen. Caller unable to be seen at [facility] today due to high Co-pay but will call office in the am to be seen. Home care per Sore Throat Protocol with cb parameters. Protocol(s) Used: Sore Throat or Hoarseness Recommended Outcome per Protocol: Provide Home/Self Care Override Outcome if Used in Protocol: See Provider within 24 hours RN Reason for Override Outcome: Nursing Judgement Used. Reason for Outcome: Sore throat AND no other symptoms Care Advice: Most adults need to drink 6-10 eight-ounce glasses (1.2-2.0 liters) of fluids per day unless previously told to limit fluid intake for other medical reasons. Limit fluids that contain caffeine, sugar or alcohol. Urine will be a very light yellow color when you drink enough fluids.  ~ Analgesic/Antipyretic Advice - Acetaminophen: Consider acetaminophen as directed on label or by pharmacist/provider for pain or fever PRECAUTIONS: - Use if there is no history of liver disease, alcoholism, or intake of three or more alcohol drinks per day - Only if approved by provider during pregnancy or when breastfeeding - During pregnancy, acetaminophen should not be taken more than 3 consecutive days without telling provider - Do not exceed recommended dose or frequency  ~ Sore Throat  Relief: - Use warm salt water gargles 3 to 4 times/day, as needed (1/2 tsp. salt in 8 oz. [.2 liters] water). - Suck on hard candy, nonprescripti Initial call taken by: Margaret Pyle, CMA,  January 13, 2010 8:42 AM  Follow-up for Phone Call        noted Follow-up by: Tresa Garter MD,  January 13, 2010 1:15 PM

## 2010-03-27 NOTE — Assessment & Plan Note (Signed)
Summary: cold,congestion,headaches,cough,plotnikov-lb   Vital Signs:  Patient profile:   65 year old female Height:      61 inches (154.94 cm) Weight:      194.25 pounds (88.30 kg) BMI:     36.84 O2 Sat:      95 % on Room air Temp:     99.1 degrees F (37.28 degrees C) oral Pulse rate:   115 / minute BP sitting:   116 / 84  (left arm) Cuff size:   large  Vitals Entered By: Brenton Grills CMA Duncan Dull) (January 13, 2010 3:28 PM)  O2 Flow:  Room air CC: Cough, congestion, HA since Friday/aj Is Patient Diabetic? No   CC:  Cough, congestion, and HA since Friday/aj.  History of Present Illness: 3 days of prod-quality cough in the chest, and assoc slight assoc worsening of her chronic wheezing.    Current Medications (verified): 1)  Alprazolam 0.5 Mg  Tabs (Alprazolam) .[name].. 1 By Mouth Two Times A Day As Needed  Anxiety 2)  Zyrtec Allergy 10 Mg  Tabs (Cetirizine Hcl) .... Take 1 By Mouth Qd 3)  Advair Diskus 250-50 Mcg/dose  Misc (Fluticasone-Salmeterol) .... Use 1 Bid 4)  Verapamil Hcl Cr 240 Mg  Tbcr (Verapamil Hcl) .... Take 1 By Mouth Qd 5)  Vitamin D3 1000 Unit  Tabs (Cholecalciferol) .... Take 2 By Mouth Qd 6)  Tramadol Hcl 50 Mg  Tabs (Tramadol Hcl) .[name].. 1or2 Two Times A Day  Prn 7)  Methocarbamol 500 Mg Tabs (Methocarbamol) .[name].. 1 By Mouth Two Times A Day As Needed Spasm 8)  Vicodin 5-500 Mg Tabs (Hydrocodone-Acetaminophen) .[name].. 1 By Mouth Two Times A Day Severe Pain Not Relieved With Tramadol 9)  Fluocinonide 0.05 %  Crea (Fluocinonide) .... Use Two Times A Day As Needed 10)  Promethazine Hcl 25 Mg  Tabs (Promethazine Hcl) .[name].. 1-2 By Mouth Qid As Needed Nausea 11)  Augmentin 875-125 Mg Tabs (Amoxicillin-Pot Clavulanate) .[name].. 1 By Mouth Two Times A Day 12)  Meclizine Hcl 25 Mg Tabs (Meclizine Hcl) .... 1/2 To 1 Three Times A Day As Needed Dizzyness 13)  Divigel 1 Mg/gm Gel (Estradiol) .... Once Daily 14)  Hydrochlorothiazide 12.5 Mg  Tabs (Hydrochlorothiazide) .... Take 1 Tab   By Mouth Every Morning 15)  Omeprazole 20 Mg Cpdr (Omeprazole) .... Take 1 By Mouth Once Daily 16)  Paroxetine Hcl 10 Mg Tabs (Paroxetine Hcl) .... 2  By Mouth Daily 17)  Lomotil 2.5-0.025 Mg Tabs (Diphenoxylate-Atropine) .[name].. 1-2 By Mouth Qid As Needed Diarrhea  Allergies (verified): 1)  ! Aspirin 2)  ! Ace Inhibitors 3)  Citalopram Hydrobromide (Citalopram Hydrobromide)  Past History:  Past Medical History: Last updated: 08/30/2007 Asthma Hypertension Anxiety Depression GERD Osteoarthritis B knee OA R>L B CTS R>L  Review of Systems  The patient denies fever and dyspnea on exertion.    Physical Exam  General:  obese.  no distress  Head:  head: no deformity eyes: no periorbital swelling, no proptosis external nose and ears are normal mouth: no lesion seen Ears:  both tm's are red Neck:  Supple without thyroid enlargement or tenderness.  Lungs:  Clear to auscultation bilaterally. Normal respiratory effort.    Impression & Recommendations:  Problem # 1:  URI (ICD-465.9) Assessment New  Problem # 2:  ASTHMA (ICD-493.90) exac by #1  Medications Added to Medication List This Visit: 1)  Medrol (pak) 4 Mg Tabs (Methylprednisolone) .... As dir 2)  Cefuroxime Axetil 250 Mg Tabs (Cefuroxime axetil) .Marland KitchenMarland KitchenMarland Kitchen  1 tab two times a day 3)  Promethazine-dm 6.25-15 Mg/66ml Syrp (Promethazine-dm) .[name].. 1 teaspoon every 4 hrs as needed for cough  Other Orders: T-2 View CXR (71020TC) Est. Patient Level III (16109)  Patient Instructions: 1)  medrol "dosepack" (steriods for your asthma). 2)  cefuroxine 250 mg two times a day (antibiotic) 3)  promethazine-dm (cough syrup) 4)  chest x-ray today. please call [phone] to hear your test results. 5)  (update: i left message on phone-tree:  rx as we discussed) Prescriptions: PROMETHAZINE-DM 6.25-15 MG/5ML SYRP (PROMETHAZINE-DM) 1 teaspoon every 4 hrs as needed for cough  #8 oz x 1   Entered and Authorized by:   Minus Breeding MD   Signed  by:   Minus Breeding MD on 01/13/2010   Method used:   [facility]  [address] [id number]       [address]       [place], [place]  [address]       Ph: [phone] or [phone]       Fax: [phone]   RxID:   [id number] CEFUROXIME AXETIL 250 MG TABS (CEFUROXIME AXETIL) 1 tab two times a day  #14 x 0   Entered and Authorized by:   Minus Breeding MD   Signed by:   Minus Breeding MD on 01/13/2010   Method used:   [facility]  [address] [id number]       [address]       [place], [place]  [address]       Ph: [phone] or [phone]       Fax: [phone]   RxID:   [id number] MEDROL (PAK) 4 MG TABS (METHYLPREDNISOLONE) as dir  #1 pack x 0   Entered and Authorized by:   Minus Breeding MD   Signed by:   Minus Breeding MD on 01/13/2010   Method used:   [facility]        [facility]  [address] [id number]       [address]       [place], [place]  [address]       Ph: [phone] or [phone]       Fax: [phone]   RxID:   [id number]    Orders Added: 1)  T-2 View CXR [71020TC] 2)  Est. Patient Level III [25852]

## 2010-03-27 NOTE — Progress Notes (Signed)
Summary: diarrhea  Phone Note Call from Patient Call back at Home Phone [phone]   Caller: Patient Call For: Tresa Garter MD Summary of Call: Patient called c/o diarrhea x 09/28/09, states Immodium is not helping. Patient is requesting rx or other advisments.Marland KitchenMarland KitchenAlvy Beal Archie CMA  September 30, 2009 4:28 PM   Follow-up for Phone Call        Try Lomotil OV if not better Follow-up by: Tresa Garter MD,  September 30, 2009 5:31 PM  Additional Follow-up for Phone Call Additional follow up Details #1::        Spoke w/pt, c/o watery stools. Immodium giving little relief. No fever. No blood or mucus or pain. Advised bland diet and call office w/change or no relief in symptoms. Pt informed of rx Additional Follow-up by: Lamar Sprinkles, CMA,  September 30, 2009 5:41 PM    New/Updated Medications: LOMOTIL 2.5-0.025 MG TABS (DIPHENOXYLATE-ATROPINE) 1-2 by mouth qid as needed diarrhea Prescriptions: LOMOTIL 2.5-0.025 MG TABS (DIPHENOXYLATE-ATROPINE) 1-2 by mouth qid as needed diarrhea  #60 x 1   Entered and Authorized by:   Tresa Garter MD   Signed by:   Tresa Garter MD on 09/30/2009   Method used:   Print then Give to Patient   RxID:   [id number]

## 2010-03-27 NOTE — Assessment & Plan Note (Signed)
Summary: nausea/diarrhea/SD   Vital Signs:  Patient profile:   65 year old female Height:      61 inches Weight:      194 pounds BMI:     36.79 O2 Sat:      95 % on Room air Temp:     97.9 degrees F oral Pulse rate:   121 / minute Pulse rhythm:   regular Resp:     16 per minute BP sitting:   100 / 60  (left arm) Cuff size:   large  Vitals Entered By: Lanier Prude, CMA(AAMA) (October 02, 2009 11:32 AM)  O2 Flow:  Room air CC: diarrhea& N/V X 4 days Is Patient Diabetic? No   CC:  diarrhea& N/V X 4 days.  History of Present Illness: C/o n/v diarrhea since Sat C/o weak and tired, clammy Loose stools > 3 yesturday She had BBQ ribs from a street stand prior  Current Medications (verified): 1)  Alprazolam 0.5 Mg  Tabs (Alprazolam) .[name].. 1 By Mouth Two Times A Day As Needed  Anxiety 2)  Zyrtec Allergy 10 Mg  Tabs (Cetirizine Hcl) .... Take 1 By Mouth Qd 3)  Advair Diskus 250-50 Mcg/dose  Misc (Fluticasone-Salmeterol) .... Use 1 Bid 4)  Verapamil Hcl Cr 240 Mg  Tbcr (Verapamil Hcl) .... Take 1 By Mouth Qd 5)  Vitamin D3 1000 Unit  Tabs (Cholecalciferol) .... Take 2 By Mouth Qd 6)  Tramadol Hcl 50 Mg  Tabs (Tramadol Hcl) .[name].. 1or2 Two Times A Day  Prn 7)  Methocarbamol 500 Mg Tabs (Methocarbamol) .[name].. 1 By Mouth Two Times A Day As Needed Spasm 8)  Vicodin 5-500 Mg Tabs (Hydrocodone-Acetaminophen) .[name].. 1 By Mouth Two Times A Day Severe Pain Not Relieved With Tramadol 9)  Fluocinonide 0.05 %  Crea (Fluocinonide) .... Use Two Times A Day As Needed 10)  Promethazine Hcl 25 Mg  Tabs (Promethazine Hcl) .[name].. 1-2 By Mouth Qid As Needed Nausea 11)  Augmentin 875-125 Mg Tabs (Amoxicillin-Pot Clavulanate) .[name].. 1 By Mouth Two Times A Day 12)  Meclizine Hcl 25 Mg Tabs (Meclizine Hcl) .... 1/2 To 1 Three Times A Day As Needed Dizzyness 13)  Divigel 1 Mg/gm Gel (Estradiol) .... Once Daily 14)  Hydrochlorothiazide 12.5 Mg  Tabs (Hydrochlorothiazide) .... Take 1 Tab  By Mouth Every Morning 15)   Omeprazole 20 Mg Cpdr (Omeprazole) .... Take 1 By Mouth Once Daily 16)  Paroxetine Hcl 10 Mg Tabs (Paroxetine Hcl) .... 2  By Mouth Daily 17)  Tussionex Pennkinetic Er 8-10 Mg/59ml Lqcr (Chlorpheniramine-Hydrocodone) .... 5 Ml By Mouth Two Times A Day Prn 18)  Prednisone 10 Mg Tabs (Prednisone) .... Take 40mg  Qd For 3 Days, Then 20 Mg Qd For 3 Days, Then 10mg  Qd For 6 Days, Then Stop. Take Pc. 19)  Lomotil 2.5-0.025 Mg Tabs (Diphenoxylate-Atropine) .[name].. 1-2 By Mouth Qid As Needed Diarrhea  Allergies (verified): 1)  ! Aspirin 2)  ! 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  The patient denies melena and hematochezia.  very weak  Physical Exam  General:  Very tired Nose:  External nasal examination shows no deformity  Mouth:  Dry oral mucosa Neck:  No deformities, masses, or tenderness noted. Lungs:  Normal respiratory effort, chest expands symmetrically. Lungs are clear to auscultation, no crackles or wheezes. Heart:  Normal rate and regular rhythm. S1 and S2 normal without gallop, murmur, click, rub or other extra sounds. Abdomen:  Bowel sounds positive,abdomen soft and non-tender, sensitive without masses, organomegaly or hernias noted. Msk:  No deformity or scoliosis noted of thoracic or lumbar spine.   Neurologic:  No cranial nerve deficits noted. Station and gait are normal. Plantar reflexes are down-going bilaterally. DTRs are symmetrical throughout. Sensory,  motor and coordinative functions appear intact. Skin:  No rash   Impression & Recommendations:  Problem # 1:  DEHYDRATION (ICD-276.51) Assessment New Refused hosp adm. She will try oral hydration at home first Meds given  Problem # 2:  DIARRHEA, ACUTE (ICD-787.91) - poss infectious Assessment: New  Her updated medication list for this problem includes:    Lomotil 2.5-0.025 Mg Tabs (Diphenoxylate-atropine) .[name]... 1-2 by mouth qid as needed diarrhea  Orders: T-Stool Giardia / Crypto- EIA (16109) T-Culture, C-Diff Toxin A/B (60454-09811) T-Culture, Stool (87045/[id number])  Problem # 3:  VOMITING (ICD-787.03) due to #1 Assessment: Improved  Problem # 4:  GERD (ICD-530.81) Assessment: Unchanged  Her updated medication list for this problem includes:    Omeprazole 20 Mg Cpdr (Omeprazole) .[name]... Take 1 by mouth once daily  Complete Medication List: 1)  Alprazolam 0.5 Mg Tabs (Alprazolam) .[name].. 1 by mouth two times a day as needed  anxiety 2)  Zyrtec Allergy 10 Mg Tabs (Cetirizine hcl) .... Take 1 by mouth qd 3)  Advair Diskus 250-50 Mcg/dose Misc (Fluticasone-salmeterol) .... Use 1 bid 4)  Verapamil Hcl Cr 240 Mg Tbcr (Verapamil hcl) .... Take 1 by mouth qd 5)  Vitamin D3 1000 Unit Tabs (Cholecalciferol) .... Take 2 by mouth qd 6)  Tramadol Hcl 50 Mg Tabs (Tramadol hcl) .[name].. 1or2 two times a day  prn 7)  Methocarbamol 500 Mg Tabs (Methocarbamol) .[name].. 1 by mouth two times a day as needed spasm 8)  Vicodin 5-500 Mg Tabs (Hydrocodone-acetaminophen) .[name].. 1 by mouth two times a day severe pain not relieved with tramadol 9)  Fluocinonide 0.05 % Crea (Fluocinonide) .... Use two times a day as needed 10)  Promethazine Hcl 25 Mg Tabs (Promethazine hcl) .[name].. 1-2 by mouth qid as needed nausea 11)  Augmentin 875-125 Mg Tabs (Amoxicillin-pot clavulanate) .[name].. 1 by mouth two times a day 12)  Meclizine Hcl 25 Mg Tabs (Meclizine hcl) .... 1/2 to 1 three times a day as needed dizzyness 13)   Divigel 1 Mg/gm Gel (Estradiol) .... Once daily 14)  Hydrochlorothiazide 12.5 Mg Tabs (Hydrochlorothiazide) .... Take 1 tab  by mouth every morning 15)  Omeprazole 20 Mg Cpdr (Omeprazole) .... Take 1 by mouth once daily 16)  Paroxetine Hcl 10 Mg Tabs (Paroxetine hcl) .... 2  by mouth daily 17)  Lomotil 2.5-0.025 Mg Tabs (Diphenoxylate-atropine) .[name].. 1-2 by mouth qid as needed diarrhea 18)  Ciprofloxacin Hcl 500 Mg Tabs (Ciprofloxacin hcl) .[name].. 1 by mouth bid  Patient Instructions: 1)  Call if you are not better in a reasonable amount of time or if worse. Go to ER if feeling really bad!  Prescriptions: CIPROFLOXACIN HCL 500 MG TABS (CIPROFLOXACIN HCL) 1 by mouth bid  #10 x 1   Entered and Authorized by:   Georgina Quint  Plotnikov MD   Signed by:   Tresa Garter MD on 10/02/2009   Method used:   [facility]  [address] [id number]       [address]       [place], [place]  [address]       Ph: [phone] or [phone]       Fax: [phone]   RxID:   [id number]    Orders Added: 1)  T-Stool Giardia / Crypto- EIA [63875] 2)  T-Culture, C-Diff Toxin A/B [[id number]] 3)  T-Culture, Stool [87045/[id number]] 4)  Est. Patient Level IV [41660]

## 2010-03-27 NOTE — Progress Notes (Signed)
Summary: PAIN   Phone Note Call from Patient Call back at Home Phone [phone]   Summary of Call: Pt has noticed a slight decrease in her pain but overall she does not "feel like herself". Continues to c/o hot flashes, fatigue & pain. Can not do "what she normally is able to do".  Patient is requesting a "good" complete physical exam, blood work & MRI. Please advise.  Initial call taken by: Lamar Sprinkles, CMA,  February 06, 2010 3:57 PM  Follow-up for Phone Call        OV w/labs prior Will order CBC, TSH, BMET, Hepatic panel, UA, Lipids, A1c, Vit B12, ESR Dx:  401.1, 272.0, 790.29 782.0 Follow-up by: Tresa Garter MD,  February 07, 2010 12:35 PM  Additional Follow-up for Phone Call Additional follow up Details #1::        Scheduled for next week.  Additional Follow-up by: Lamar Sprinkles, CMA,  February 07, 2010 3:31 PM

## 2010-03-27 NOTE — Progress Notes (Signed)
Summary: Advair  Phone Note Refill Request Message from:  Patient on August 30, 2009 2:08 PM  Refills Requested: Medication #1:  ADVAIR DISKUS 250-50 MCG/DOSE  MISC use 1 bid Left message on machine to call back to office, patient should have plenty of refills left on this med.  Initial call taken by: Lucious Groves,  August 30, 2009 2:08 PM  Follow-up for Phone Call        left message on machine for pt to return my call. I also called [facility] [address] and Rx was not dropped off at this location. Follow-up by: Margaret Pyle, CMA,  August 30, 2009 3:41 PM  Additional Follow-up for Phone Call Additional follow up Details #1::        Sent in rx to only local pharm on file, closing phone note Additional Follow-up by: Lamar Sprinkles, CMA,  August 30, 2009 6:54 PM    Prescriptions: ADVAIR DISKUS 250-50 MCG/DOSE  MISC (FLUTICASONE-SALMETEROL) use 1 bid  #1 x 12   Entered by:   Lamar Sprinkles, CMA   Authorized by:   Tresa Garter MD   Signed by:   Lamar Sprinkles, CMA on 08/30/2009   Method used:   [facility]        [facility]  [address] [id number]       [address]       [place], [place]  [address]       Ph: [phone] or [phone]       Fax: [phone]   RxID:   [id number]

## 2010-03-27 NOTE — Assessment & Plan Note (Signed)
Summary: pain/SD   Vital Signs:  Patient profile:   65 year old female Height:      61 inches Weight:      197 pounds BMI:     37.36 Temp:     98.1 degrees F oral Pulse rate:   76 / minute Pulse rhythm:   regular Resp:     16 per minute BP sitting:   130 / 90  (left arm) Cuff size:   large  Vitals Entered By: Lanier Prude, CMA(AAMA) (January 22, 2010 4:29 PM) CC: LBP X >1 wk Is Patient Diabetic? No   CC:  LBP X >1 wk.  History of Present Illness: C/o URI 1 wk ago  C/o L LBP  x 1 wk - severe 10/10, no irrad She had an x ray  and UA w/ other labs on Sun  Current Medications (verified): 1)  Alprazolam 0.5 Mg  Tabs (Alprazolam) .[name].. 1 By Mouth Two Times A Day As Needed  Anxiety 2)  Zyrtec Allergy 10 Mg  Tabs (Cetirizine Hcl) .... Take 1 By Mouth Qd 3)  Advair Diskus 250-50 Mcg/dose  Misc (Fluticasone-Salmeterol) .... Use 1 Bid 4)  Verapamil Hcl Cr 240 Mg  Tbcr (Verapamil Hcl) .... Take 1 By Mouth Qd 5)  Vitamin D3 1000 Unit  Tabs (Cholecalciferol) .... Take 2 By Mouth Qd 6)  Tramadol Hcl 50 Mg  Tabs (Tramadol Hcl) .[name].. 1or2 Two Times A Day  Prn 7)  Methocarbamol 500 Mg Tabs (Methocarbamol) .[name].. 1 By Mouth Two Times A Day As Needed Spasm 8)  Oxycodone-Acetaminophen 5-325 Mg Tabs (Oxycodone-Acetaminophen) 9)  Omeprazole 20 Mg Cpdr (Omeprazole) .... Take 1 By Mouth Once Daily 10)  Paroxetine Hcl 10 Mg Tabs (Paroxetine Hcl) .... 2  By Mouth Daily 11)  Medrol (Pak) 4 Mg Tabs (Methylprednisolone) .... As Dir 12)  Cefuroxime Axetil 250 Mg Tabs (Cefuroxime Axetil) .[name].. 1 Tab Two Times A Day 13)  Promethazine-Dm 6.25-15 Mg/48ml Syrp (Promethazine-Dm) .[name].. 1 Teaspoon Every 4 Hrs As Needed For Cough 14)  Diclofenac Sodium 75 Mg Tbec (Diclofenac Sodium) .... Take 1 Tablet By Mouth Every 12 Hours As Needed Pain. 15)  Oxycodone-Acetaminophen 5-325 Mg Tabs (Oxycodone-Acetaminophen) .[name].. 1 Tab By Mouth Two Times A Day As Needed Pain 16)  Cyclobenzaprine Hcl 10 Mg Tabs (Cyclobenzaprine Hcl)  .[name].. 1 Tab By Mouth At Bedtime As Needed Muscle Spasm  Allergies (verified): 1)  ! Aspirin 2)  ! Ace Inhibitors 3)  Citalopram Hydrobromide (Citalopram Hydrobromide)  Past History:  Past Medical History: Last updated: 08/30/2007 Asthma Hypertension Anxiety Depression GERD Osteoarthritis B knee OA R>L B CTS R>L  Social History: Last updated: 05/18/2007 Retired Single separated 2009 Never Smoked  Review of Systems  The patient denies fever, chest pain, syncope, abdominal pain, and melena.    Physical Exam  General:  obese appearing female in mild distress due to pain  Mouth:  WNL Lungs:  Normal respiratory effort, chest expands symmetrically. Lungs are clear to auscultation, no crackles or wheezes. Heart:  Normal rate and regular rhythm. S1 and S2 normal without gallop, murmur, click, rub or other extra sounds. Abdomen:  Bowel sounds positive,abdomen soft and non-tender without masses, organomegaly or hernias noted. Msk:  Lumbar-sacral spine is very tender to palpation over L paraspinal muscles and painfull with the ROM  in a W/C Extremities:  no edema Neurologic:  No cranial nerve deficits noted.  Plantar reflexes are down-going bilaterally. DTRs are symmetrical throughout. Nonfocal. Skin:  Intact without suspicious lesions or  rashes Psych:  Cognition and judgment appear intact. Alert and cooperative with normal attention span and concentration. No apparent delusions, illusions, hallucinations   Impression & Recommendations:  Problem # 1:  BACK STRAIN, LUMBAR (ICD-847.2) vs myositis (ie viral) Assessment Deteriorated See "Patient Instructions". On the regimen of medicine(s) reflected in the chart   Orders: Physical Therapy Referral (PT) Admin of Therapeutic Inj  intramuscular or subcutaneous (16109) Admin of Therapeutic Inj  intramuscular or subcutaneous (60454) Demerol  100mg   Injection (U9811) Depo- Medrol 40mg  (J1030) Promethazine up to 50mg   (J2550)  Problem # 2:  URI (ICD-465.9) Assessment: New  The following medications were removed from the medication list:    Promethazine-dm 6.25-15 Mg/22ml Syrp (Promethazine-dm) .[name]... 1 teaspoon every 4 hrs as needed for cough    Diclofenac Sodium 75 Mg Tbec (Diclofenac sodium) .[name]... Take 1 tablet by mouth every 12 hours as needed pain. Her updated medication list for this problem includes:    Zyrtec Allergy 10 Mg Tabs (Cetirizine hcl) .[name]... Take 1 by mouth qd  Complete Medication List: 1)  Alprazolam 0.5 Mg Tabs (Alprazolam) .[name].. 1 by mouth two times a day as needed  anxiety 2)  Zyrtec Allergy 10 Mg Tabs (Cetirizine hcl) .... Take 1 by mouth qd 3)  Advair Diskus 250-50 Mcg/dose Misc (Fluticasone-salmeterol) .... Use 1 bid 4)  Verapamil Hcl Cr 240 Mg Tbcr (Verapamil hcl) .... Take 1 by mouth qd 5)  Vitamin D3 1000 Unit Tabs (Cholecalciferol) .... Take 2 by mouth qd 6)  Tramadol Hcl 50 Mg Tabs (Tramadol hcl) .[name].. 1or2 two times a day  prn 7)  Methocarbamol 500 Mg Tabs (Methocarbamol) .[name].. 1 by mouth two times a day as needed spasm 8)  Omeprazole 20 Mg Cpdr (Omeprazole) .... Take 1 by mouth once daily 9)  Paroxetine Hcl 10 Mg Tabs (Paroxetine hcl) .... 2  by mouth daily 10)  Cefuroxime Axetil 250 Mg Tabs (Cefuroxime axetil) .[name].. 1 tab two times a day 11)  Cyclobenzaprine Hcl 10 Mg Tabs (Cyclobenzaprine hcl) .[name].. 1 tab by mouth at bedtime as needed muscle spasm 12)  Hydromorphone Hcl 2 Mg Tabs (Hydromorphone hcl) .[name].. 1-2 by mouth three times a day as needed pain  Patient Instructions: 1)  Please schedule a follow-up appointment in 2 weeks. Prescriptions: HYDROMORPHONE HCL 2 MG TABS (HYDROMORPHONE HCL) 1-2 by mouth three times a day as needed pain  #60 x 0   Entered and Authorized by:   Tresa Garter MD   Signed by:   Tresa Garter MD on 01/22/2010   Method used:   Print then Give to Patient   RxID:   [id number]    Medication Administration  Injection # 1:     Medication: Depo- Medrol 40mg     Diagnosis: BACK STRAIN, LUMBAR (ICD-847.2)    Route: IM    Site: LUOQ gluteus    Exp Date: 05/24/2012    Lot #: [id number]    Mfr: Pharmacia    Comments: 120mg     Patient tolerated injection without complications    Given by: Lamar Sprinkles, CMA (January 24, 2010 9:14 AM)  Injection # 2:    Medication: Promethazine up to 50mg     Diagnosis: BACK STRAIN, LUMBAR (ICD-847.2)    Route: IM    Site: RUOQ gluteus    Exp Date: 07/25/2011    Lot #: [id number]    Mfr: Novartis    Comments: 50mg     Patient tolerated injection without complications    Given by:  Lamar Sprinkles, CMA (January 24, 2010 9:15 AM)  Injection # 3:    Medication: Demerol  100mg   Injection    Diagnosis: BACK STRAIN, LUMBAR (ICD-847.2)    Route: IM    Site: RUOQ gluteus    Exp Date: 12/25/2010    Lot #: [id number]    Mfr: hospira    Comments: 50mg  - given with phenergan    Patient tolerated injection without complications    Given by: Lamar Sprinkles, CMA (January 24, 2010 9:17 AM)  Orders Added: 1)  Physical Therapy Referral [PT] 2)  Admin of Therapeutic Inj  intramuscular or subcutaneous [96372] 3)  Admin of Therapeutic Inj  intramuscular or subcutaneous [96372] 4)  Demerol  100mg   Injection [J2175] 5)  Depo- Medrol 40mg  [J1030] 6)  Promethazine up to 50mg  [J2550] 7)  Est. Patient Level IV [62130]     Medication Administration  Injection # 1:    Medication: Depo- Medrol 40mg     Diagnosis: BACK STRAIN, LUMBAR (ICD-847.2)    Route: IM    Site: LUOQ gluteus    Exp Date: 05/24/2012    Lot #: [id number]    Mfr: Pharmacia    Comments: 120mg     Patient tolerated injection without complications    Given by: Lamar Sprinkles, CMA (January 24, 2010 9:14 AM)  Injection # 2:    Medication: Promethazine up to 50mg     Diagnosis: BACK STRAIN, LUMBAR (ICD-847.2)    Route: IM    Site: RUOQ gluteus    Exp Date: 07/25/2011    Lot #: [id number]    Mfr: Novartis    Comments: 50mg     Patient  tolerated injection without complications    Given by: Lamar Sprinkles, CMA (January 24, 2010 9:15 AM)  Injection # 3:    Medication: Demerol  100mg   Injection    Diagnosis: BACK STRAIN, LUMBAR (ICD-847.2)    Route: IM    Site: RUOQ gluteus    Exp Date: 12/25/2010    Lot #: [id number]    Mfr: hospira    Comments: 50mg  - given with phenergan    Patient tolerated injection without complications    Given by: Lamar Sprinkles, CMA (January 24, 2010 9:17 AM)  Orders Added: 1)  Physical Therapy Referral [PT] 2)  Admin of Therapeutic Inj  intramuscular or subcutaneous [96372] 3)  Admin of Therapeutic Inj  intramuscular or subcutaneous [96372] 4)  Demerol  100mg   Injection [J2175] 5)  Depo- Medrol 40mg  [J1030] 6)  Promethazine up to 50mg  [J2550] 7)  Est. Patient Level IV [69629]

## 2010-03-27 NOTE — Progress Notes (Signed)
Summary: Rf Hydrocod-Acetam  Phone Note Refill Request Message from:  Fax from Pharmacy  Refills Requested: Medication #1:  VICODIN 5-500 MG TABS 1 by mouth two times a day severe pain not relieved with Tramadol   Dosage confirmed as above?Dosage Confirmed   Supply Requested: 120   Last Refilled: 04/30/2009  Method Requested: Telephone to Pharmacy Initial call taken by: Lanier Prude, [facility]),  November 06, 2009 3:50 PM  Follow-up for Phone Call        ok 2 ref Follow-up by: Tresa Garter MD,  November 06, 2009 5:06 PM    Prescriptions: VICODIN 5-500 MG TABS (HYDROCODONE-ACETAMINOPHEN) 1 by mouth two times a day severe pain not relieved with Tramadol  #120 x 2   Entered by:   Lamar Sprinkles, CMA   Authorized by:   Tresa Garter MD   Signed by:   Lamar Sprinkles, CMA on 11/06/2009   Method used:   [facility] ...       [facility]  [address] [id number]       [address]       [place], [place]  [address]       Ph: [phone] or [phone]       Fax: [phone]   RxID:   [id number]

## 2010-04-16 ENCOUNTER — Telehealth: Payer: Self-pay | Admitting: Internal Medicine

## 2010-04-22 NOTE — Progress Notes (Signed)
Summary: Cough  Phone Note Call from Patient Call back at [facility] Phone [phone]   Summary of Call: Pt has had a persistent dry cough. She wants to know if any of her daily meds could be the cause?  Initial call taken by: Lamar Sprinkles, CMA,  April 16, 2010 7:11 PM  Follow-up for Phone Call        I doubt.Marland KitchenMarland KitchenSome people get cough on Advair - ok to stop x 1-2 wks and see... Follow-up by: Tresa Garter MD,  April 17, 2010 7:51 AM  Additional Follow-up for Phone Call Additional follow up Details #1::        Pt informed Additional Follow-up by: Lamar Sprinkles, CMA,  April 17, 2010 5:27 PM

## 2010-04-25 ENCOUNTER — Encounter: Payer: Self-pay | Admitting: Internal Medicine

## 2010-05-01 NOTE — Miscellaneous (Signed)
Summary: mammogram 2012  Clinical Lists Changes  Observations: Added new observation of MAMMOGRAM: normal (02/27/2010 16:43)      Preventive Care Screening  Mammogram:    Date:  02/27/2010    Results:  normal

## 2010-05-02 ENCOUNTER — Telehealth (INDEPENDENT_AMBULATORY_CARE_PROVIDER_SITE_OTHER): Payer: Self-pay | Admitting: *Deleted

## 2010-05-06 NOTE — Progress Notes (Signed)
  Phone Note Other Incoming   Request: Send information Summary of Call: Request for records received from [facility]. Request forwarded to [employer].  2000 to present-Plotnikov

## 2010-05-07 LAB — URINALYSIS, ROUTINE W REFLEX MICROSCOPIC
Bilirubin Urine: NEGATIVE
Glucose, UA: NEGATIVE mg/dL
Hgb urine dipstick: NEGATIVE
Ketones, ur: NEGATIVE mg/dL
Protein, ur: NEGATIVE mg/dL
pH: 7 (ref 5.0–8.0)

## 2010-05-07 LAB — COMPREHENSIVE METABOLIC PANEL
ALT: 47 U/L — ABNORMAL HIGH (ref 0–35)
Alkaline Phosphatase: 63 U/L (ref 39–117)
BUN: 14 mg/dL (ref 6–23)
CO2: 34 mEq/L — ABNORMAL HIGH (ref 19–32)
Calcium: 9 mg/dL (ref 8.4–10.5)
GFR calc non Af Amer: >60 mL/min (ref >60)
Glucose, Bld: 105 mg/dL — ABNORMAL HIGH (ref 70–99)
Sodium: 140 mEq/L (ref 135–145)

## 2010-05-07 LAB — DIFFERENTIAL
Basophils Relative: 0 % (ref 0–1)
Eosinophils Absolute: 0.4 10*3/uL (ref 0.0–0.7)
Lymphs Abs: 5 10*3/uL — ABNORMAL HIGH (ref 0.7–4.0)
Neutro Abs: 14.2 10*3/uL — ABNORMAL HIGH (ref 1.7–7.7)
Neutrophils Relative %: 66 % (ref 43–77)

## 2010-05-07 LAB — CBC
HCT: 43 % (ref 36.0–46.0)
Hemoglobin: 14.2 g/dL (ref 12.0–15.0)
MCHC: 33.1 g/dL (ref 30.0–36.0)
MCV: 92.7 fL (ref 78.0–100.0)
RDW: 14.8 % (ref 11.5–15.5)

## 2010-07-11 NOTE — Assessment & Plan Note (Signed)
[facility]                                 ON-CALL NOTE   CAYCI, MCNABB                      MRN:          [id number]  DATE:01/24/2006                            DOB:          04-25-1945    PRIMARY CARE PHYSICIAN:  Georgina Quint. Plotnikov, MD.   PHONE NUMBER:  [phone].   SUBJECTIVE:  Ms. Borys states she has is having symptoms of a cold  and has a history of asthma. The constant cough is keeping her up at  night and she is unable to sleep. The has taken Tussionex in the past  with this and it has helped significantly. She is requesting this now.   ASSESSMENT/PLAN:  Recommended appointment with her primary care  physician some time next week to make sure she is not having an asthma  exacerbation. She denies current symptoms at this point in time. A  shortterm course [facility], one teaspoon p.o. q.h.s. #4 ounces, 0 refills.     Kerby Nora, MD  Electronically Signed    AB/MedQ  DD: 01/24/2006  DT: 01/25/2006  Job #: [id number]

## 2010-07-30 ENCOUNTER — Telehealth: Payer: Self-pay | Admitting: Internal Medicine

## 2010-07-30 NOTE — Telephone Encounter (Signed)
Pt complaining of persistant nausea. Pt requesting to be seen. Pt scheduled to see Mike Gip PA 08/06/10@3pm , Dr. Marina Goodell is the supervising physician that afternoon. Pt aware of appt date and time.

## 2010-08-06 ENCOUNTER — Ambulatory Visit (INDEPENDENT_AMBULATORY_CARE_PROVIDER_SITE_OTHER): Payer: BC Managed Care – PPO | Admitting: Physician Assistant

## 2010-08-06 ENCOUNTER — Encounter: Payer: Self-pay | Admitting: Physician Assistant

## 2010-08-06 ENCOUNTER — Other Ambulatory Visit (INDEPENDENT_AMBULATORY_CARE_PROVIDER_SITE_OTHER): Payer: BC Managed Care – PPO

## 2010-08-06 VITALS — BP 142/76 | HR 88 | Ht 63.0 in | Wt 181.0 lb

## 2010-08-06 DIAGNOSIS — R110 Nausea: Secondary | ICD-10-CM

## 2010-08-06 DIAGNOSIS — K219 Gastro-esophageal reflux disease without esophagitis: Secondary | ICD-10-CM

## 2010-08-06 LAB — CBC WITH DIFFERENTIAL/PLATELET
Basophils Relative: 0.3 % (ref 0.0–3.0)
Eosinophils Relative: 2 % (ref 0.0–5.0)
Hemoglobin: 12.7 g/dL (ref 12.0–15.0)
Lymphs Abs: 3.6 10*3/uL (ref 0.7–4.0)
MCV: 92.5 fl (ref 78.0–100.0)
Monocytes Relative: 11.2 % (ref 3.0–12.0)
Platelets: 356 10*3/uL (ref 150.0–400.0)

## 2010-08-06 LAB — HEPATIC FUNCTION PANEL
ALT: 21 U/L (ref 0–35)
AST: 28 U/L (ref 0–37)
Albumin: 4 g/dL (ref 3.5–5.2)
Alkaline Phosphatase: 72 U/L (ref 39–117)
Total Bilirubin: 0.5 mg/dL (ref 0.3–1.2)

## 2010-08-06 LAB — LIPASE: Lipase: 27 U/L (ref 11.0–59.0)

## 2010-08-06 MED ORDER — OMEPRAZOLE 20 MG PO CPDR: 20.0000 mg | DELAYED_RELEASE_CAPSULE | Freq: Every day | ORAL | Status: DC

## 2010-08-06 NOTE — Patient Instructions (Signed)
We scheduled the Abdominal Ultrasound for Mon 08-11-10. Direction provided. We also scheduled the Endoscopy with Dr. Yancey Flemings for 08-18-2010. Directions and brochure provided. We have given you samples of Prilosec 20 mg . Take 1 30 min before breakfast and dinner.

## 2010-08-07 ENCOUNTER — Encounter: Payer: Self-pay | Admitting: Internal Medicine

## 2010-08-10 NOTE — Progress Notes (Signed)
Agree with initial assessment and plans 

## 2010-08-10 NOTE — Progress Notes (Signed)
Subjective:    Patient ID: Mallory Klein, female    DOB: 08-27-1945, 65 y.o.   MRN: [id number]  HPI Mallory Klein is a 65 year old female known to Dr. Georga Hacking. She had colonoscopy in November 2006 for screening and this was unremarkable except for left colon diverticulosis and internal hemorrhoids. She has not had prior EGD. She comes in today with complaints of nausea. She relates onset of symptoms in November of 2011 at which time she had an illness with prolonged coughing after that she had developed left flank pain she believes from a pulled muscle and says she hurt for about 2 months. After that time she had onset of nausea with frequent vomiting and says she has not felt completely normal sense. She relates frequent episodes of nausea and a decrease in her appetite with intermittent episodes of emesis. She says she has an episode at least 2 days a week. She currently denies any heartburn or indigestion has no dysphagia and no complaints of abdominal pain. Her bowel habits have been normal she has not noted any melena or hematochezia. She has not had any fever or chills. She has been on Voltaren chronically X. one aspirin daily. She is also chronically on omeprazole 20 mg daily. She has lost about 10 pounds over the past 6 months.  Patient also mentions that she's been under a lot of stress over the past several months. She been the primary caregiver for mother who has passed away with dementia and is now dealing with her father who also has dementia and has been difficult to deal with.    Review of Systems  Constitutional: Positive for appetite change.  HENT: Negative.   Eyes: Negative.   Respiratory: Positive for cough.   Cardiovascular: Negative.   Gastrointestinal: Positive for nausea and vomiting.  Genitourinary: Negative.   Musculoskeletal: Negative.   Skin: Negative.   Neurological: Negative.   Hematological: Negative.   Psychiatric/Behavioral: The patient is nervous/anxious.     Outpatient Prescriptions Prior to Visit  Medication Sig Dispense Refill  . ALPRAZolam (XANAX) 0.5 MG tablet Take 0.5 mg by mouth 2 (two) times daily as needed.        . cetirizine (ZYRTEC) 10 MG tablet Take 10 mg by mouth daily.        . Cholecalciferol (VITAMIN D3) 1000 UNITS tablet Take 2 tablets by mouth once daily       . estrogens, conjugated, (PREMARIN) 0.625 MG tablet Take 0.625 mg by mouth daily. Take daily for 21 days then do not take for 7 days.       . Fluticasone-Salmeterol (ADVAIR DISKUS) 250-50 MCG/DOSE AEPB Inhale 1 puff into the lungs every 12 (twelve) hours.        [name] omeprazole (PRILOSEC) 20 MG capsule Take 20 mg by mouth daily.        [name] PARoxetine (PAXIL) 10 MG tablet Take 20 mg by mouth daily.        . verapamil (COVERA HS) 240 MG (CO) 24 hr tablet Take 240 mg by mouth at bedtime.        . diclofenac (VOLTAREN) 75 MG EC tablet Take 75 mg by mouth daily.        . methocarbamol (ROBAXIN) 500 MG tablet Take 500 mg by mouth 2 (two) times daily as needed.        . traMADol (ULTRAM) 50 MG tablet Take 50 mg by mouth every 6 (six) hours as needed.  Objective:   Physical Exam Well-developed female in no acute distress, pleasant, alert oriented x3  HEENT; nontraumatic normocephalic EOMI PERRLA sclera anicteric Neck; Supple no JVD  Cardiovascular; regular rate and rhythm with S1-S2 Pulmonary; clear bilaterally  Abdomen; large soft nontender no palpable mass or hepatosplenomegaly, bowel sounds active Rectal; exam not don e Extremities; benign no edema  Psych; mood and affect normal an appropriate.        Assessment & Plan:  #92 66 year old female with a several month history of nausea and intermittent vomiting somewhat episodic. Etiology not clear rule out possible aspirin/NSAID-induced gastropathy or peptic ulcer disease, rule out gallbladder disease, rule out functional dyspepsia secondary to anxiety/stress.  Plan; Schedule for upper endoscopy with Dr. Marina Goodell,  procedure discussed in detail with the patient Schedule for upper abdominal ultrasound. Increase omeprazole to 20 mg twice daily Stop Voltaren.  Phenergan 25 mg every 6 hours as needed for nausea CBC,CMET,lipase. today .  #2 Diverticulosis  #3 Chronic anxiety.  #4 Colon neoplasia surveillance Last colonoscopy November 2006, due for followup 2016

## 2010-08-11 ENCOUNTER — Ambulatory Visit (HOSPITAL_COMMUNITY)
Admission: RE | Admit: 2010-08-11 | Discharge: 2010-08-11 | Disposition: A | Discharge status: Home / Self Care | Payer: BC Managed Care – PPO | Source: Ambulatory Visit | Attending: Physician Assistant | Admitting: Physician Assistant

## 2010-08-11 DIAGNOSIS — Z9089 Acquired absence of other organs: Secondary | ICD-10-CM | POA: Insufficient documentation

## 2010-08-11 DIAGNOSIS — R110 Nausea: Secondary | ICD-10-CM | POA: Insufficient documentation

## 2010-08-11 DIAGNOSIS — K219 Gastro-esophageal reflux disease without esophagitis: Secondary | ICD-10-CM | POA: Insufficient documentation

## 2010-08-11 DIAGNOSIS — I10 Essential (primary) hypertension: Secondary | ICD-10-CM | POA: Insufficient documentation

## 2010-08-11 DIAGNOSIS — E119 Type 2 diabetes mellitus without complications: Secondary | ICD-10-CM | POA: Insufficient documentation

## 2010-08-11 IMAGING — US US ABDOMEN COMPLETE
1 series · 14 of 25 positions shown · non-contrast
Comparison: None

CLINICAL DATA: Nausea, GERD, hypertension, diabetes

ULTRASOUND ABDOMEN:
TECHNIQUE: Sonography of upper abdominal structures was performed.

[Series 1: us abdomen complete · 0.32mm/px · 14 of 80 slices shown]
[im 1/80]
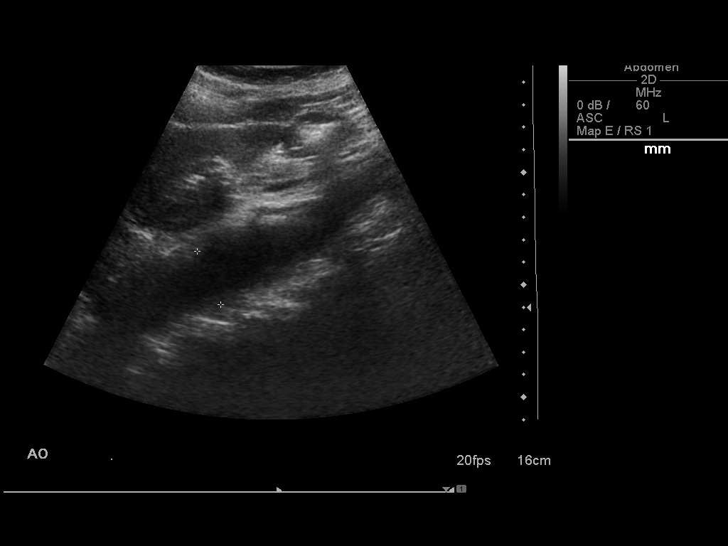
[im 7/80]
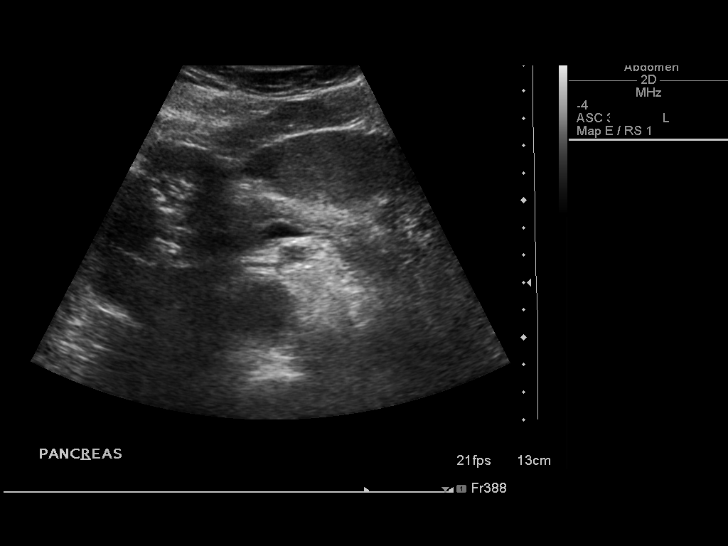
[im 14/80]
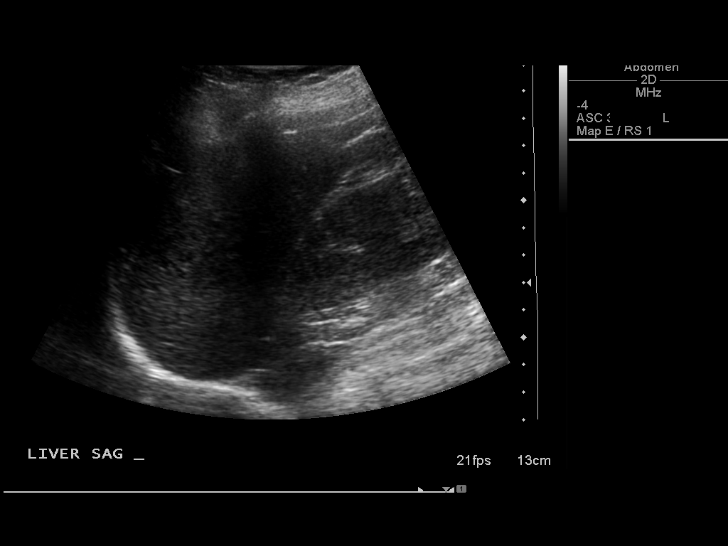
[im 20/80]
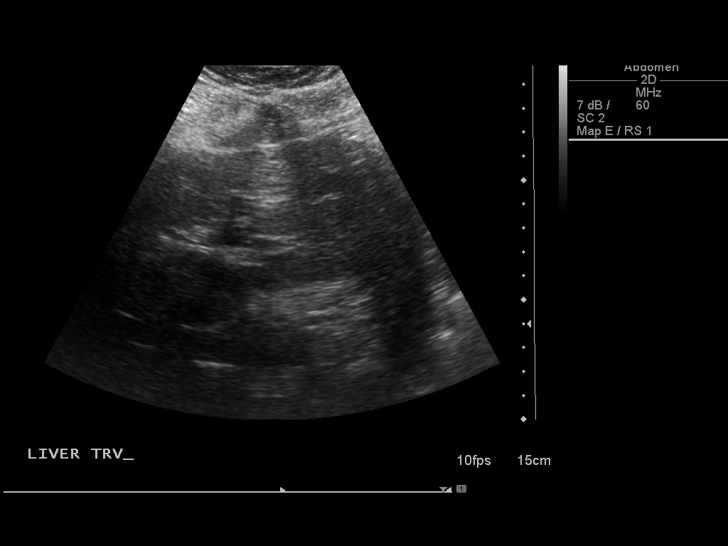
[im 27/80]
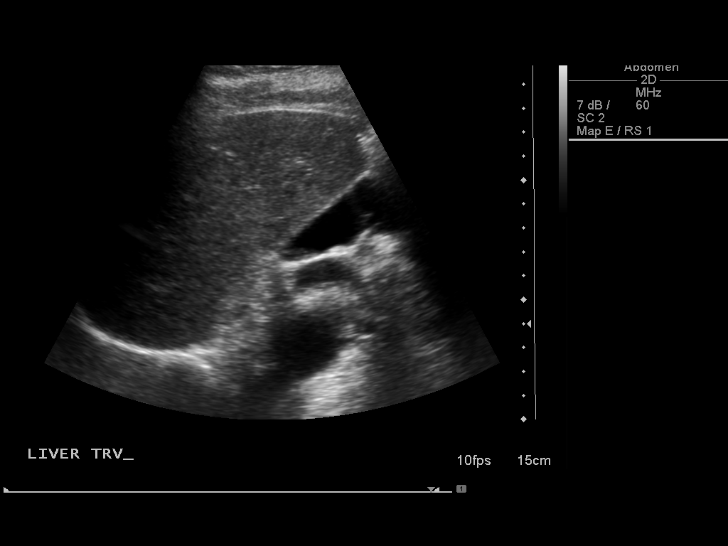
[im 30/80]
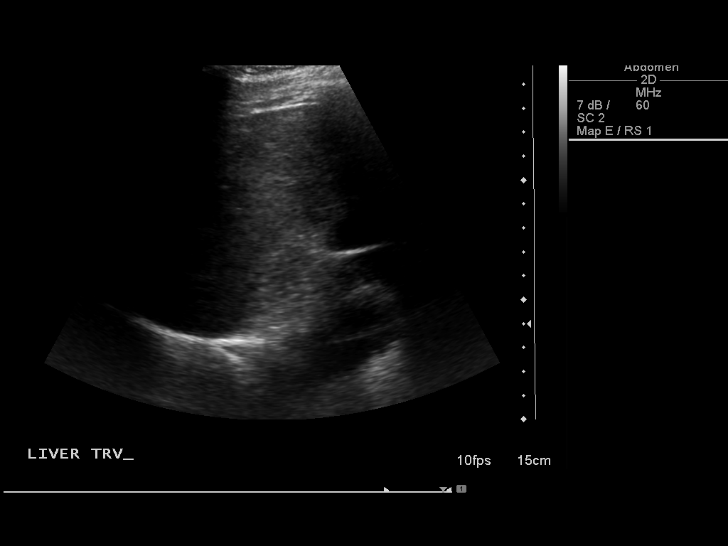
[im 37/80]
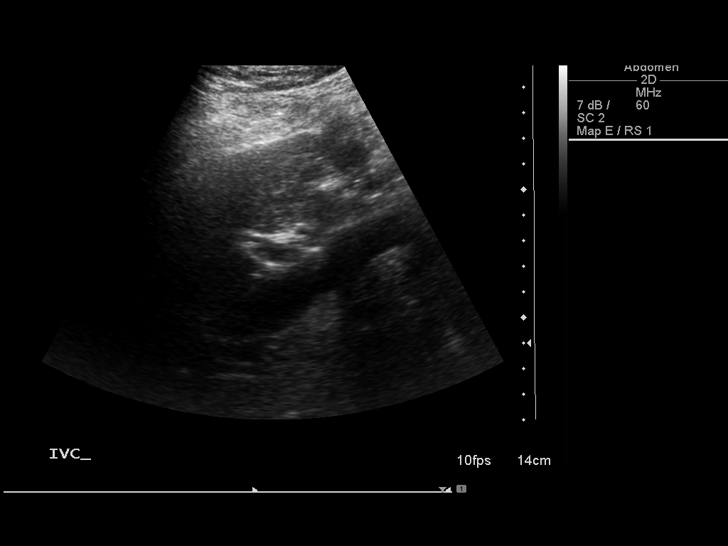
[im 43/80]
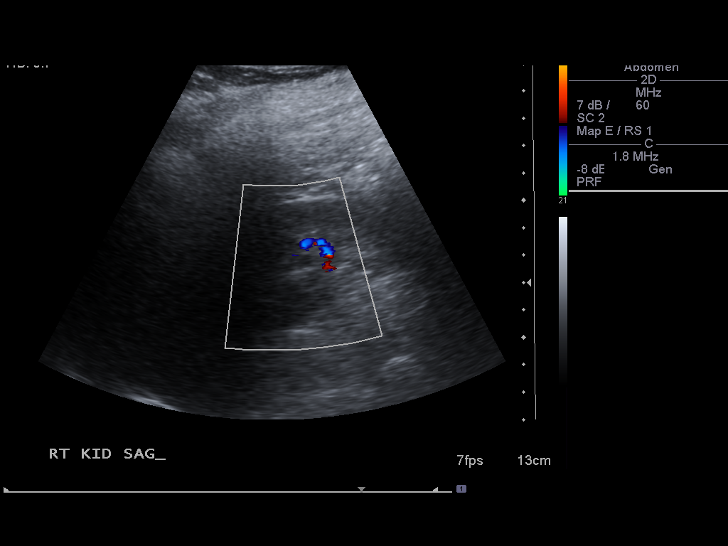
[im 50/80]
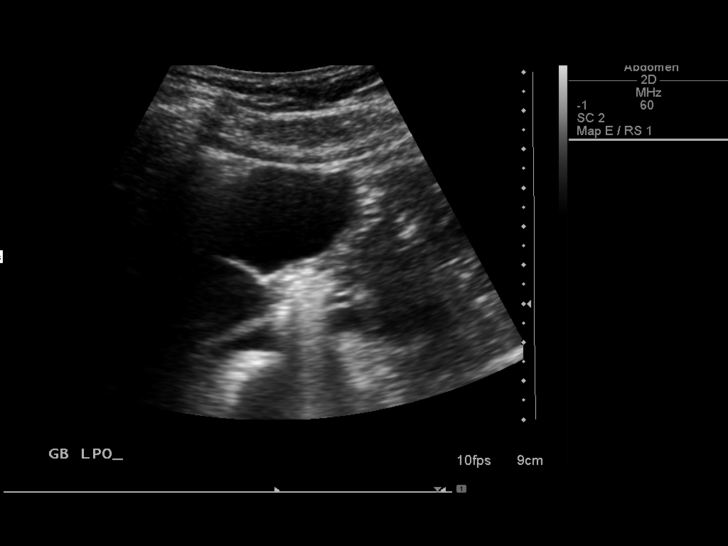
[im 53/80]
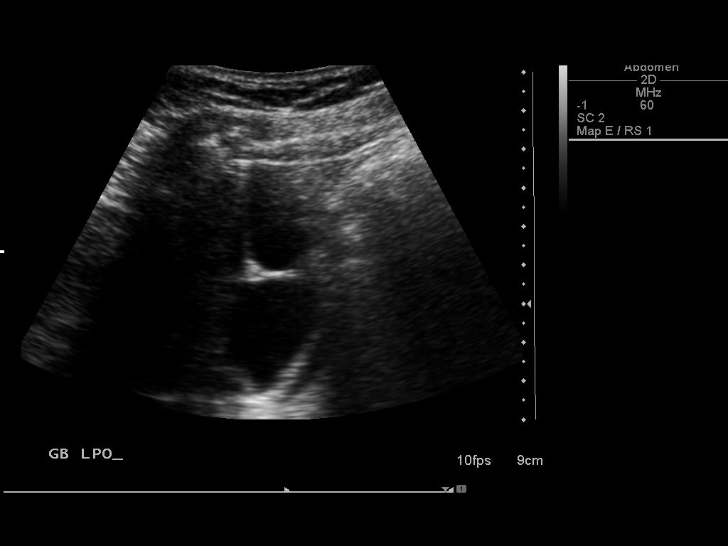
[im 60/80]
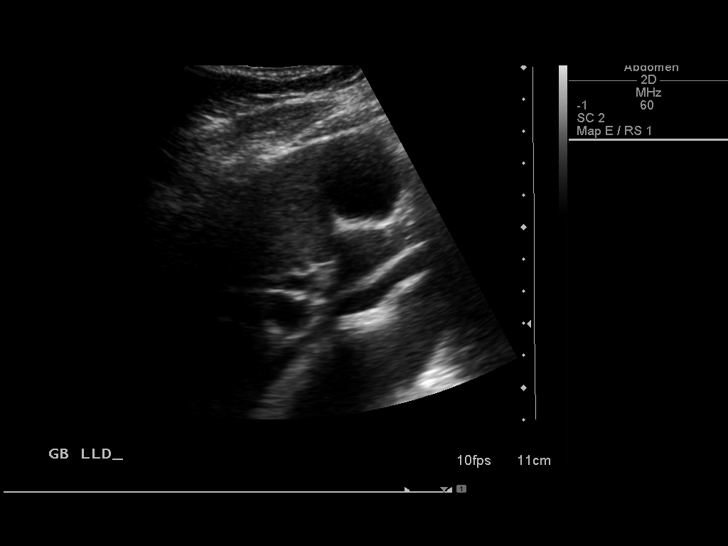
[im 66/80]
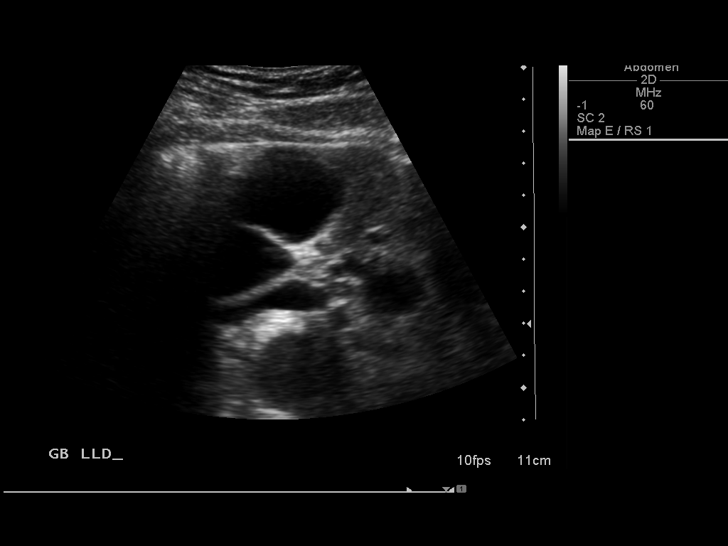
[im 73/80]
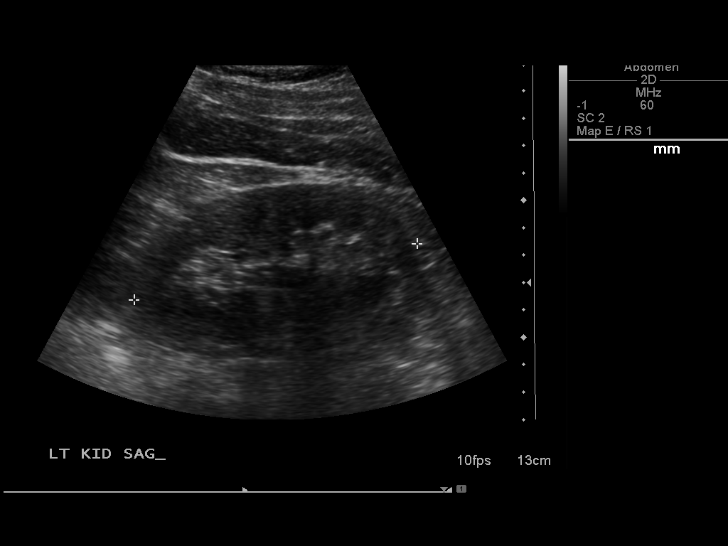
[im 80/80]
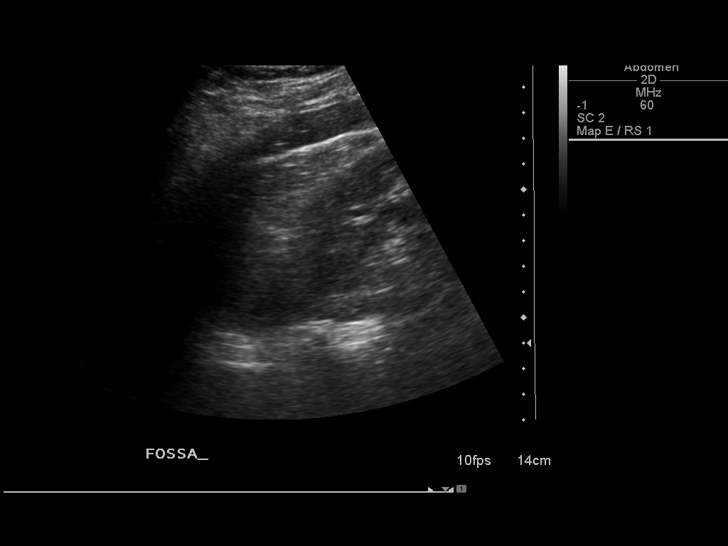

[14 of 25 positions shown; findings below may reference images not displayed]

Gallbladder:  Normally distended without stones or wall thickening.
No pericholecystic fluid or sonographic Murphy sign.

Common bile duct:  5 mm diameter, normal

Liver:  Normal appearance

IVC:  Normal appearance

Pancreas:  Normal appearance

Spleen:  Surgically absent

Right kidney:  10.1 cm length. Normal morphology without mass or
hydronephrosis.

Left kidney:  10.5 cm length. Normal morphology without mass or
hydronephrosis.

Aorta:  Normal appearance

Other:  No free fluid
IMPRESSION: Post splenectomy.
No acute abnormalities.

## 2010-08-18 ENCOUNTER — Ambulatory Visit (AMBULATORY_SURGERY_CENTER): Payer: BC Managed Care – PPO | Admitting: Internal Medicine

## 2010-08-18 ENCOUNTER — Encounter: Payer: Self-pay | Admitting: Internal Medicine

## 2010-08-18 VITALS — BP 134/87 | HR 56 | Temp 97.5°F | Resp 27 | Ht 61.5 in | Wt 181.0 lb

## 2010-08-18 DIAGNOSIS — R110 Nausea: Secondary | ICD-10-CM

## 2010-08-18 DIAGNOSIS — K222 Esophageal obstruction: Secondary | ICD-10-CM

## 2010-08-18 DIAGNOSIS — K219 Gastro-esophageal reflux disease without esophagitis: Secondary | ICD-10-CM

## 2010-08-18 MED ORDER — SODIUM CHLORIDE 0.9 % IV SOLN: 500.0000 mL | INTRAVENOUS | Status: DC

## 2010-08-18 NOTE — Patient Instructions (Signed)
Please read blue and green discharge instruction sheets 

## 2010-08-19 ENCOUNTER — Telehealth: Payer: Self-pay | Admitting: *Deleted

## 2010-08-19 NOTE — Telephone Encounter (Signed)

## 2010-08-20 ENCOUNTER — Telehealth: Payer: Self-pay | Admitting: Internal Medicine

## 2010-08-20 NOTE — Telephone Encounter (Signed)
Pt aware of Dr. Perry's recommendations. 

## 2010-08-20 NOTE — Telephone Encounter (Signed)
Pt states that she had an egd on Monday. States she is having a lot of reflux this morning, she took a prilosec when she got up this am but she feels like it is "working its way up." Pt states she feels like she is going to throw up. She also states that she took some Maalox and that has not helped. Pt has phenergan on her med list for nausea. Dr. Marina Goodell please advise.

## 2010-08-20 NOTE — Telephone Encounter (Signed)
Double up on her PPIi and use phenergan for nausea. May prescribe Zofran if phenergan too sedating

## 2010-08-29 ENCOUNTER — Other Ambulatory Visit: Payer: Self-pay | Admitting: Internal Medicine

## 2010-09-05 ENCOUNTER — Other Ambulatory Visit: Payer: Self-pay | Admitting: *Deleted

## 2010-09-05 ENCOUNTER — Telehealth: Payer: Self-pay

## 2010-09-05 ENCOUNTER — Telehealth: Payer: Self-pay | Admitting: *Deleted

## 2010-09-05 MED ORDER — DICLOFENAC SODIUM 1 % TD GEL: 1.0000 "application " | Freq: Four times a day (QID) | TRANSDERMAL | Status: DC

## 2010-09-05 NOTE — Telephone Encounter (Signed)
Pt states she cant take Tramadol- ? Causing nausea. She has been informed a new Rx for Voltaren gel was sent to pharm but she is requesting something else in addition to it. Please advise.  She also requesting new Rx to increase quantity for Omeprazole because Dr. Marina Goodell advised her to double up on it. Please advise

## 2010-09-05 NOTE — Telephone Encounter (Signed)
Patient called mom requesting medication to help with bilateral knee pain. Per pt, Dr Marina Goodell thinks that tramadol causes nausea for her. Patient requesting Voltarin Gel. Please advise

## 2010-09-05 NOTE — Progress Notes (Signed)
Pt walked in. See prev phone note. Ok to give new rx. See meds

## 2010-09-05 NOTE — Telephone Encounter (Signed)
OK both Thx 

## 2010-09-08 ENCOUNTER — Telehealth: Payer: Self-pay | Admitting: *Deleted

## 2010-09-08 ENCOUNTER — Encounter: Payer: Self-pay | Admitting: Internal Medicine

## 2010-09-08 ENCOUNTER — Ambulatory Visit (INDEPENDENT_AMBULATORY_CARE_PROVIDER_SITE_OTHER): Payer: BC Managed Care – PPO | Admitting: Internal Medicine

## 2010-09-08 ENCOUNTER — Telehealth: Payer: Self-pay | Admitting: Internal Medicine

## 2010-09-08 DIAGNOSIS — I10 Essential (primary) hypertension: Secondary | ICD-10-CM

## 2010-09-08 DIAGNOSIS — F411 Generalized anxiety disorder: Secondary | ICD-10-CM

## 2010-09-08 DIAGNOSIS — M25561 Pain in right knee: Secondary | ICD-10-CM

## 2010-09-08 DIAGNOSIS — S335XXA Sprain of ligaments of lumbar spine, initial encounter: Secondary | ICD-10-CM

## 2010-09-08 DIAGNOSIS — M25569 Pain in unspecified knee: Secondary | ICD-10-CM

## 2010-09-08 MED ORDER — TRAMADOL HCL 50 MG PO TABS: 50.0000 mg | ORAL_TABLET | Freq: Four times a day (QID) | ORAL | Status: DC | PRN

## 2010-09-08 MED ORDER — OMEPRAZOLE 20 MG PO CPDR: 40.0000 mg | DELAYED_RELEASE_CAPSULE | Freq: Every day | ORAL | Status: DC

## 2010-09-08 MED ORDER — METHYLPREDNISOLONE ACETATE 40 MG/ML IJ SUSP: 40.0000 mg | Freq: Once | INTRAMUSCULAR | Status: DC

## 2010-09-08 MED ORDER — DEXLANSOPRAZOLE 60 MG PO CPDR: 60.0000 mg | DELAYED_RELEASE_CAPSULE | Freq: Every day | ORAL | Status: DC

## 2010-09-08 NOTE — Telephone Encounter (Signed)
Pt states that she is having a lot of reflux. She states that the omeprazole she is taking is not helping. Pt is requesting to try something else for reflux. Dr. Marina Goodell please advise.

## 2010-09-08 NOTE — Telephone Encounter (Signed)
Pt was c/o burning in her chest and throat. She is not nauseated and she states that the burning even bothers her when she is sleeping. Dexilant rx sent in for the pt.

## 2010-09-08 NOTE — Assessment & Plan Note (Signed)
Cont Rx Cont w/wt loss

## 2010-09-08 NOTE — Patient Instructions (Signed)
Postprocedure instructions :    A Band-Aid should be left on for 12 hours. Injection therapy is not a cure itself. It is used in conjunction with other modalities. You can use nonsteroidal anti-inflammatories like ibuprofen , hot and cold compresses. Rest is recommended in the next 24 hours. You need to report immediately  if fever, chills or any signs of infection develop. 

## 2010-09-08 NOTE — Assessment & Plan Note (Signed)
On Rx 

## 2010-09-08 NOTE — Telephone Encounter (Signed)
Called  [phone] to [employer]atin PA form for Voltaren gel. Rec form. Will forward to MD for signature.

## 2010-09-08 NOTE — Assessment & Plan Note (Signed)
Procedure Note :     Procedure :Joint Injection,   R knee   Indication:  Joint osteoarthritis with refractory  chronic pain.   Risks including unsuccessful procedure , bleeding, infection, bruising, skin atrophy and others were explained to the patient in detail as well as the benefits. Informed consent was obtained and signed.   Tthe patient was placed in a comfortable position. Lateral approach was used. Skin was prepped with Betadine and alcohol  and anesthetized with 2 cc of 2% lidocaine and epinephrine, using a 25-gauge 1-1/2 inch needle. Then, a 5 cc syringe with a 2 inch long 22-gauge needle was used for a joint injection.. The needle was advanced  Into the knee joint cavity. I aspirated a small amount of intra-articular fluid to confirm correct placement of the needle and injected the joint with 5 mL of 2% lidocaine and 40 mg of Depo-Medrol .  Band-Aid was applied.   Tolerated well. Complications: None. Good pain relief following the procedure.   Postprocedure instructions :    A Band-Aid should be left on for 12 hours. Injection therapy is not a cure itself. It is used in conjunction with other modalities. You can use nonsteroidal anti-inflammatories like ibuprofen , hot and cold compresses. Rest is recommended in the next 24 hours. You need to report immediately  if fever, chills or any signs of infection develop.

## 2010-09-08 NOTE — Assessment & Plan Note (Signed)
Cont w/Tramadol

## 2010-09-08 NOTE — Progress Notes (Signed)
  Subjective:    Patient ID: Mallory Klein, female    DOB: [birth year], 65 y.o.   MRN: [id number]  HPI   C/o GERD and nausea She has done a LBP study C/o LBP C/o R knee pain >> L  Review of Systems  Constitutional: Negative for chills, activity change, appetite change, fatigue and unexpected weight change.  HENT: Negative for congestion, mouth sores and sinus pressure.   Eyes: Negative for visual disturbance.  Respiratory: Negative for cough and chest tightness.   Gastrointestinal: Positive for nausea. Negative for vomiting and abdominal pain.       GERD  Genitourinary: Negative for frequency, difficulty urinating and vaginal pain.  Musculoskeletal: Negative for back pain and gait problem.  Skin: Negative for pallor and rash.  Neurological: Negative for dizziness, tremors, weakness, numbness and headaches.  Psychiatric/Behavioral: Negative for confusion and sleep disturbance.       Objective:   Physical Exam  Constitutional: She appears well-developed and well-nourished. No distress.       Obese  HENT:  Head: Normocephalic.  Right Ear: External ear normal.  Left Ear: External ear normal.  Nose: Nose normal.  Mouth/Throat: Oropharynx is clear and moist.  Eyes: Conjunctivae are normal. Pupils are equal, round, and reactive to light. Right eye exhibits no discharge. Left eye exhibits no discharge.  Neck: Normal range of motion. Neck supple. No JVD present. No tracheal deviation present. No thyromegaly present.  Cardiovascular: Normal rate, regular rhythm and normal heart sounds.   Pulmonary/Chest: No stridor. No respiratory distress. She has no wheezes.  Abdominal: Soft. Bowel sounds are normal. She exhibits no distension and no mass. There is no tenderness. There is no rebound and no guarding.  Musculoskeletal: She exhibits tenderness (R knee w/o effusion. Tender). She exhibits no edema.       Using a cane  Lymphadenopathy:    She has no cervical adenopathy.    Neurological: She displays normal reflexes. No cranial nerve deficit. She exhibits normal muscle tone. Coordination normal.  Skin: No rash noted. No erythema.  Psychiatric: She has a normal mood and affect. Her behavior is normal. Judgment and thought content normal.          Assessment & Plan:

## 2010-09-08 NOTE — Telephone Encounter (Signed)
Voltaren requires PA.  Pt would like something else in addition to this. Please advise.  New Rx for Omeprozole # 60 sent in to "double up" as previously advised by GI.

## 2010-09-08 NOTE — Telephone Encounter (Signed)
What does she means by "reflux"? We certainly can try a different PPI, and see. Dexilant 60 mg daily

## 2010-09-09 ENCOUNTER — Telehealth: Payer: Self-pay | Admitting: Internal Medicine

## 2010-09-09 NOTE — Telephone Encounter (Signed)
Samples of Dexilant and discount card left at front desk for patient to pick up.  Called and informed pt.

## 2010-09-09 NOTE — Telephone Encounter (Signed)
Rec fax back from [employer] stating Voltaren has been approved from 08-18-10 until 09-08-11. Pt and pharmacy informed.

## 2010-09-09 NOTE — Telephone Encounter (Signed)
Faxed completed PA form to ins. Will wait for approval/denial decision.

## 2010-09-11 NOTE — Telephone Encounter (Signed)
Ok Thx 

## 2010-09-16 ENCOUNTER — Telehealth: Payer: Self-pay | Admitting: Internal Medicine

## 2010-09-17 NOTE — Telephone Encounter (Signed)
Advised patient that [employer] denied Dexilant because she has not tried and failed Omeprazole or Nexium prior to taking Dexilant.  I advised her to try the discount card I gave her and let me know if this helps her.  If not, I will give her samples of Omeprazole and Nexium for her to try.  If these do not help her, I will resubmit the PA for Dexilant.  Pt. Agreed

## 2010-09-18 ENCOUNTER — Other Ambulatory Visit: Payer: Self-pay | Admitting: Internal Medicine

## 2010-09-18 NOTE — Telephone Encounter (Signed)
Samples of Dexilant left at front desk.  Pt. Informed.  She is applying for assistance for her medication.

## 2010-10-02 ENCOUNTER — Telehealth: Payer: Self-pay | Admitting: Internal Medicine

## 2010-10-02 ENCOUNTER — Telehealth: Payer: Self-pay | Admitting: *Deleted

## 2010-10-02 NOTE — Telephone Encounter (Signed)
Called patient and left samples at front desk.  Advised her to have them send forms directly to my attn and fax number.

## 2010-10-07 NOTE — Telephone Encounter (Signed)
Britta Mccreedy spoke to pt.

## 2010-10-08 ENCOUNTER — Telehealth: Payer: Self-pay | Admitting: Internal Medicine

## 2010-10-09 ENCOUNTER — Other Ambulatory Visit: Payer: Self-pay | Admitting: *Deleted

## 2010-10-09 MED ORDER — DEXLANSOPRAZOLE 60 MG PO CPDR: 60.0000 mg | DELAYED_RELEASE_CAPSULE | Freq: Every day | ORAL | Status: DC

## 2010-10-09 NOTE — Telephone Encounter (Signed)
Rx. Printed for 90 day supply.  Application will be filled out and signed.  I will fax once it is complete.  Pt. Is aware.

## 2010-10-09 NOTE — Telephone Encounter (Signed)
Called patient and advised her that I did receive the paperwork and will complete and fax back by tomorrow.

## 2010-10-20 ENCOUNTER — Encounter: Payer: Self-pay | Admitting: Internal Medicine

## 2010-10-20 NOTE — Telephone Encounter (Signed)
Error

## 2010-10-23 NOTE — Telephone Encounter (Signed)
Paper work completed and copied.  Patient notified it is ready to be picked up.  Samples left also until assistance is in effect.

## 2010-11-20 ENCOUNTER — Ambulatory Visit (INDEPENDENT_AMBULATORY_CARE_PROVIDER_SITE_OTHER): Payer: BC Managed Care – PPO | Admitting: *Deleted

## 2010-11-20 DIAGNOSIS — Z23 Encounter for immunization: Secondary | ICD-10-CM

## 2010-12-03 DIAGNOSIS — M1711 Unilateral primary osteoarthritis, right knee: Secondary | ICD-10-CM | POA: Insufficient documentation

## 2010-12-25 DIAGNOSIS — M5137 Other intervertebral disc degeneration, lumbosacral region: Secondary | ICD-10-CM | POA: Insufficient documentation

## 2010-12-25 DIAGNOSIS — M4310 Spondylolisthesis, site unspecified: Secondary | ICD-10-CM | POA: Insufficient documentation

## 2010-12-25 DIAGNOSIS — M51379 Other intervertebral disc degeneration, lumbosacral region without mention of lumbar back pain or lower extremity pain: Secondary | ICD-10-CM | POA: Insufficient documentation

## 2010-12-25 DIAGNOSIS — M48061 Spinal stenosis, lumbar region without neurogenic claudication: Secondary | ICD-10-CM | POA: Insufficient documentation

## 2011-01-19 ENCOUNTER — Telehealth: Payer: Self-pay | Admitting: *Deleted

## 2011-01-19 MED ORDER — HYDROCODONE-ACETAMINOPHEN 5-325 MG PO TABS: 1.0000 | ORAL_TABLET | Freq: Three times a day (TID) | ORAL | Status: AC | PRN

## 2011-01-19 NOTE — Telephone Encounter (Signed)
Pt  Left vm req Rf on Hydroco/APAP 5-325 mg. Please advise- med is not active on list. What sig/quantity? [facility]

## 2011-01-19 NOTE — Telephone Encounter (Signed)
Ok done Thx 

## 2011-01-19 NOTE — Telephone Encounter (Signed)
Rx faxed to pharmacy requested.

## 2011-02-05 ENCOUNTER — Telehealth: Payer: Self-pay | Admitting: Internal Medicine

## 2011-02-06 ENCOUNTER — Telehealth: Payer: Self-pay

## 2011-02-06 NOTE — Telephone Encounter (Signed)
called to let patient know I was leaving dexilant samples up front; she is picking them up monday

## 2011-02-24 HISTORY — PX: TOTAL KNEE ARTHROPLASTY: SHX125

## 2011-03-02 ENCOUNTER — Encounter: Payer: Self-pay | Admitting: Internal Medicine

## 2011-03-02 ENCOUNTER — Other Ambulatory Visit: Payer: Self-pay

## 2011-03-02 ENCOUNTER — Ambulatory Visit (INDEPENDENT_AMBULATORY_CARE_PROVIDER_SITE_OTHER): Payer: Medicare Other | Admitting: Internal Medicine

## 2011-03-02 VITALS — BP 130/84 | HR 80 | Temp 97.4°F | Resp 16 | Ht 61.5 in | Wt 181.0 lb

## 2011-03-02 DIAGNOSIS — R7309 Other abnormal glucose: Secondary | ICD-10-CM

## 2011-03-02 DIAGNOSIS — K219 Gastro-esophageal reflux disease without esophagitis: Secondary | ICD-10-CM

## 2011-03-02 DIAGNOSIS — I10 Essential (primary) hypertension: Secondary | ICD-10-CM

## 2011-03-02 DIAGNOSIS — Z0000 Encounter for general adult medical examination without abnormal findings: Secondary | ICD-10-CM

## 2011-03-02 DIAGNOSIS — Z136 Encounter for screening for cardiovascular disorders: Secondary | ICD-10-CM

## 2011-03-02 DIAGNOSIS — R739 Hyperglycemia, unspecified: Secondary | ICD-10-CM

## 2011-03-02 DIAGNOSIS — M1990 Unspecified osteoarthritis, unspecified site: Secondary | ICD-10-CM

## 2011-03-02 DIAGNOSIS — F411 Generalized anxiety disorder: Secondary | ICD-10-CM

## 2011-03-02 DIAGNOSIS — Z23 Encounter for immunization: Secondary | ICD-10-CM

## 2011-03-02 HISTORY — DX: Hyperglycemia, unspecified: R73.9

## 2011-03-02 MED ORDER — DEXLANSOPRAZOLE 60 MG PO CPDR: 60.0000 mg | DELAYED_RELEASE_CAPSULE | Freq: Every day | ORAL | Status: DC

## 2011-03-02 NOTE — Progress Notes (Signed)
  Subjective:    Patient ID: Mallory Klein, female    DOB: 1945-11-21, 66 y.o.   MRN: [id number]  HPI  The patient is here for a wellness exam. The patient has been doing well overall without major physical or psychological issues going on lately. The patient needs to address  chronic hypertension that has been well controlled with medicines; to address chronic  hyperlipidemia controlled with medicines as well; and to address type 2 chronic diabetes, controlled with medical treatment and diet. R TKR Dr Josefine Class in [place]  Review of Systems  Constitutional: Negative.  Negative for fever, chills, diaphoresis, activity change, appetite change, fatigue and unexpected weight change.  HENT: Negative for hearing loss, ear pain, nosebleeds, congestion, sore throat, facial swelling, rhinorrhea, sneezing, mouth sores, trouble swallowing, neck pain, neck stiffness, postnasal drip, sinus pressure and tinnitus.   Eyes: Negative for pain, discharge, redness, itching and visual disturbance.  Respiratory: Negative for cough, chest tightness, shortness of breath, wheezing and stridor.   Cardiovascular: Negative for chest pain, palpitations and leg swelling.  Gastrointestinal: Negative for nausea, diarrhea, constipation, blood in stool, abdominal distention, anal bleeding and rectal pain.  Genitourinary: Negative for dysuria, urgency, frequency, hematuria, flank pain, vaginal bleeding, vaginal discharge, difficulty urinating, genital sores and pelvic pain.  Musculoskeletal: Positive for arthralgias and gait problem. Negative for back pain and joint swelling.       R knee pain  Skin: Negative.  Negative for rash.  Neurological: Negative for dizziness, tremors, seizures, syncope, speech difficulty, weakness, numbness and headaches.  Hematological: Negative for adenopathy. Does not bruise/bleed easily.  Psychiatric/Behavioral: Positive for sleep disturbance. Negative for suicidal ideas, behavioral problems,  dysphoric mood and decreased concentration. The patient is not nervous/anxious.        Objective:   Physical Exam  Constitutional: She appears well-developed. No distress.  HENT:  Head: Normocephalic.  Right Ear: External ear normal.  Left Ear: External ear normal.  Nose: Nose normal.  Mouth/Throat: Oropharynx is clear and moist.  Eyes: Conjunctivae are normal. Pupils are equal, round, and reactive to light. Right eye exhibits no discharge. Left eye exhibits no discharge.  Neck: Normal range of motion. Neck supple. No JVD present. No tracheal deviation present. No thyromegaly present.  Cardiovascular: Normal rate, regular rhythm and normal heart sounds.  Exam reveals no gallop and no friction rub.   No murmur heard. Pulmonary/Chest: No stridor. No respiratory distress. She has no wheezes.  Abdominal: Soft. Bowel sounds are normal. She exhibits no distension and no mass. There is no tenderness. There is no rebound and no guarding.  Musculoskeletal: She exhibits no edema and no tenderness.  Lymphadenopathy:    She has no cervical adenopathy.  Neurological: She displays normal reflexes. No cranial nerve deficit. She exhibits normal muscle tone. Coordination normal.  Skin: No rash noted. No erythema. No pallor.  Psychiatric: She has a normal mood and affect. Her behavior is normal. Judgment and thought content normal.          Assessment & Plan:

## 2011-03-02 NOTE — Assessment & Plan Note (Addendum)
The patient is here for annual Medicare wellness examination and management of other chronic and acute problems.   The risk factors are reflected in the social history.  The roster of all physicians providing medical care to patient - is listed in the Snapshot section of the chart.  Activities of daily living:  The patient is 100% inedpendent in all ADLs: dressing, toileting, feeding as well as independent mobility  Home safety : The patient has smoke detectors in the home. They wear seatbelts.No firearms at home ( firearms are present in the home, kept in a safe fashion). There is no violence in the home.   There is no risks for hepatitis, STDs or HIV. There is no   history of blood transfusion. They have no travel history to infectious disease endemic areas of the world.  The patient has (has not) seen their dentist in the last six month. They have (not) seen their eye doctor in the last year. They deny (admit to) any hearing difficulty and have not had audiologic testing in the last year.  They do not  have excessive sun exposure. Discussed the need for sun protection: hats, long sleeves and use of sunscreen if there is significant sun exposure.   Diet: the importance of a healthy diet is discussed. They do have a healthy (unhealthy-high fat/fast food) diet.  The patient has a regular exercise program: no.  The benefits of regular aerobic exercise were discussed.  Depression screen: there are no signs or vegative symptoms of depression- irritability, change in appetite, anhedonia, sadness/tearfullness.  Cognitive assessment: the patient manages all their financial and personal affairs and is actively engaged. They could relate day,date,year and events; recalled 3/3 objects at 3 minutes; performed clock-face test normally.  The following portions of the patient's history were reviewed and updated as appropriate: allergies, current medications, past family history, past medical history,  past  surgical history, past social history  and problem list.  Vision, hearing, body mass index were assessed and reviewed.   During the course of the visit the patient was educated and counseled about appropriate screening and preventive services including : fall prevention , diabetes screening, nutrition counseling, colorectal cancer screening, and recommended immunizations - zostavax Knee pain prevents her from exercising.

## 2011-03-02 NOTE — Assessment & Plan Note (Signed)
Continue with current prescription therapy as reflected on the Med list.  

## 2011-03-02 NOTE — Progress Notes (Signed)
Addended by: Janeal Holmes on: 03/02/2011 09:15 PM   Modules accepted: Level of Service

## 2011-03-02 NOTE — Assessment & Plan Note (Signed)
Continue with current prescription therapy as reflected on the Med list. BP Readings from Last 3 Encounters:  03/02/11 130/84  09/08/10 120/78  08/18/10 134/87

## 2011-03-02 NOTE — Assessment & Plan Note (Signed)
Check A1c. 

## 2011-03-04 ENCOUNTER — Other Ambulatory Visit (INDEPENDENT_AMBULATORY_CARE_PROVIDER_SITE_OTHER): Payer: Medicare Other

## 2011-03-04 ENCOUNTER — Ambulatory Visit: Payer: Medicare Other | Attending: Internal Medicine | Admitting: Physical Therapy

## 2011-03-04 DIAGNOSIS — Z0000 Encounter for general adult medical examination without abnormal findings: Secondary | ICD-10-CM

## 2011-03-04 DIAGNOSIS — IMO0001 Reserved for inherently not codable concepts without codable children: Secondary | ICD-10-CM | POA: Insufficient documentation

## 2011-03-04 DIAGNOSIS — M1990 Unspecified osteoarthritis, unspecified site: Secondary | ICD-10-CM

## 2011-03-04 DIAGNOSIS — F411 Generalized anxiety disorder: Secondary | ICD-10-CM

## 2011-03-04 DIAGNOSIS — R739 Hyperglycemia, unspecified: Secondary | ICD-10-CM

## 2011-03-04 DIAGNOSIS — M25569 Pain in unspecified knee: Secondary | ICD-10-CM | POA: Insufficient documentation

## 2011-03-04 DIAGNOSIS — Z136 Encounter for screening for cardiovascular disorders: Secondary | ICD-10-CM

## 2011-03-04 DIAGNOSIS — R7309 Other abnormal glucose: Secondary | ICD-10-CM

## 2011-03-04 DIAGNOSIS — I10 Essential (primary) hypertension: Secondary | ICD-10-CM

## 2011-03-04 DIAGNOSIS — M25669 Stiffness of unspecified knee, not elsewhere classified: Secondary | ICD-10-CM | POA: Insufficient documentation

## 2011-03-04 LAB — BASIC METABOLIC PANEL
Calcium: 9.3 mg/dL (ref 8.4–10.5)
GFR: 80.76 mL/min (ref >60.00)
Potassium: 4.3 mEq/L (ref 3.5–5.1)
Sodium: 142 mEq/L (ref 135–145)

## 2011-03-04 LAB — HEPATIC FUNCTION PANEL
ALT: 18 U/L (ref 0–35)
Bilirubin, Direct: 0.1 mg/dL (ref 0.0–0.3)
Total Bilirubin: 0.7 mg/dL (ref 0.3–1.2)

## 2011-03-04 LAB — URINALYSIS
Hgb urine dipstick: NEGATIVE
Nitrite: NEGATIVE
Specific Gravity, Urine: 1.025 (ref 1.000–1.030)
Total Protein, Urine: NEGATIVE
Urine Glucose: NEGATIVE
Urobilinogen, UA: 0.2 (ref 0.0–1.0)

## 2011-03-04 LAB — CBC WITH DIFFERENTIAL/PLATELET
Basophils Absolute: 0 10*3/uL (ref 0.0–0.1)
Eosinophils Relative: 4.1 % (ref 0.0–5.0)
HCT: 41.2 % (ref 36.0–46.0)
Hemoglobin: 13.1 g/dL (ref 12.0–15.0)
Lymphocytes Relative: 40.4 % (ref 12.0–46.0)
Lymphs Abs: 3.4 10*3/uL (ref 0.7–4.0)
Monocytes Relative: 10.8 % (ref 3.0–12.0)
Neutro Abs: 3.7 10*3/uL (ref 1.4–7.7)
WBC: 8.4 10*3/uL (ref 4.5–10.5)

## 2011-03-04 LAB — HEMOGLOBIN A1C: Hgb A1c MFr Bld: 5.9 % (ref 4.6–6.5)

## 2011-03-09 ENCOUNTER — Other Ambulatory Visit: Payer: Self-pay | Admitting: *Deleted

## 2011-03-09 ENCOUNTER — Telehealth: Payer: Self-pay

## 2011-03-09 MED ORDER — VERAPAMIL HCL 240 MG (CO) PO TB24: 240.0000 mg | ORAL_TABLET | Freq: Every day | ORAL | Status: DC

## 2011-03-09 MED ORDER — AZITHROMYCIN 250 MG PO TABS: ORAL_TABLET | ORAL | Status: AC

## 2011-03-09 NOTE — Telephone Encounter (Signed)
Left detailed mess informing pt Rx sent 

## 2011-03-09 NOTE — Telephone Encounter (Signed)
OK Zpac Thx 

## 2011-03-09 NOTE — Telephone Encounter (Signed)
Pt called c/o pf cough and HA. Pt says that she has been taking OTC pain medications as well as Coricidin HBP but with no improvement. Pt also has nasal/sinus congestions, and ear pressure. Pt is requesting ABX, please advise.

## 2011-03-10 ENCOUNTER — Ambulatory Visit: Payer: Medicare Other

## 2011-03-17 ENCOUNTER — Ambulatory Visit: Payer: Medicare Other | Admitting: Physical Therapy

## 2011-03-19 ENCOUNTER — Ambulatory Visit (INDEPENDENT_AMBULATORY_CARE_PROVIDER_SITE_OTHER): Payer: Medicare Other | Admitting: *Deleted

## 2011-03-19 ENCOUNTER — Ambulatory Visit: Payer: Medicare Other | Admitting: Physical Therapy

## 2011-03-19 DIAGNOSIS — Z23 Encounter for immunization: Secondary | ICD-10-CM

## 2011-03-24 ENCOUNTER — Ambulatory Visit: Payer: Medicare Other | Admitting: Physical Therapy

## 2011-04-08 ENCOUNTER — Encounter: Payer: Self-pay | Admitting: Internal Medicine

## 2011-04-10 ENCOUNTER — Other Ambulatory Visit: Payer: Self-pay

## 2011-04-10 MED ORDER — PAROXETINE HCL 10 MG PO TABS: 20.0000 mg | ORAL_TABLET | Freq: Every day | ORAL | Status: DC

## 2011-04-16 NOTE — Telephone Encounter (Signed)
completed

## 2011-04-22 ENCOUNTER — Ambulatory Visit: Payer: Medicare Other | Admitting: Physical Therapy

## 2011-04-22 DIAGNOSIS — Z96659 Presence of unspecified artificial knee joint: Secondary | ICD-10-CM | POA: Insufficient documentation

## 2011-04-28 ENCOUNTER — Ambulatory Visit: Payer: Medicare Other | Attending: Internal Medicine | Admitting: Physical Therapy

## 2011-04-28 DIAGNOSIS — M25669 Stiffness of unspecified knee, not elsewhere classified: Secondary | ICD-10-CM | POA: Insufficient documentation

## 2011-04-28 DIAGNOSIS — IMO0001 Reserved for inherently not codable concepts without codable children: Secondary | ICD-10-CM | POA: Insufficient documentation

## 2011-04-28 DIAGNOSIS — M25569 Pain in unspecified knee: Secondary | ICD-10-CM | POA: Insufficient documentation

## 2011-04-29 ENCOUNTER — Ambulatory Visit: Payer: Medicare Other | Admitting: Rehabilitative and Restorative Service Providers"

## 2011-04-29 ENCOUNTER — Ambulatory Visit: Payer: Medicare Other | Admitting: Physical Therapy

## 2011-05-04 ENCOUNTER — Ambulatory Visit: Payer: Medicare Other | Admitting: Physical Therapy

## 2011-05-05 ENCOUNTER — Encounter: Payer: Medicare Other | Admitting: Physical Therapy

## 2011-05-06 ENCOUNTER — Ambulatory Visit: Payer: Medicare Other | Admitting: Physical Therapy

## 2011-05-11 ENCOUNTER — Ambulatory Visit: Payer: Medicare Other | Admitting: Physical Therapy

## 2011-05-13 ENCOUNTER — Ambulatory Visit: Payer: Medicare Other | Admitting: Physical Therapy

## 2011-05-14 ENCOUNTER — Ambulatory Visit: Payer: Medicare Other | Admitting: Physical Therapy

## 2011-05-18 ENCOUNTER — Ambulatory Visit: Payer: Medicare Other | Admitting: Physical Therapy

## 2011-05-20 ENCOUNTER — Ambulatory Visit: Payer: Medicare Other | Admitting: Physical Therapy

## 2011-05-21 ENCOUNTER — Ambulatory Visit: Payer: Medicare Other | Admitting: Physical Therapy

## 2011-05-25 ENCOUNTER — Ambulatory Visit: Payer: Medicare Other | Attending: Internal Medicine | Admitting: Physical Therapy

## 2011-05-25 DIAGNOSIS — M25569 Pain in unspecified knee: Secondary | ICD-10-CM | POA: Insufficient documentation

## 2011-05-25 DIAGNOSIS — M25669 Stiffness of unspecified knee, not elsewhere classified: Secondary | ICD-10-CM | POA: Insufficient documentation

## 2011-05-25 DIAGNOSIS — IMO0001 Reserved for inherently not codable concepts without codable children: Secondary | ICD-10-CM | POA: Insufficient documentation

## 2011-05-27 ENCOUNTER — Ambulatory Visit: Payer: Medicare Other | Admitting: Physical Therapy

## 2011-05-28 ENCOUNTER — Encounter: Payer: Medicare Other | Admitting: Physical Therapy

## 2011-05-29 ENCOUNTER — Other Ambulatory Visit: Payer: Self-pay | Admitting: *Deleted

## 2011-05-29 MED ORDER — ALPRAZOLAM 0.5 MG PO TABS: 0.5000 mg | ORAL_TABLET | Freq: Two times a day (BID) | ORAL | Status: DC | PRN

## 2011-05-29 NOTE — Telephone Encounter (Signed)
R'cd fax from [facility] for refill of Alprazolam-please advise on refill

## 2011-05-29 NOTE — Telephone Encounter (Signed)
OK to fill this prescription with additional refills x3 Thank you!  

## 2011-05-29 NOTE — Telephone Encounter (Signed)
Rx printed for MD signtature

## 2011-06-02 ENCOUNTER — Ambulatory Visit: Payer: Medicare Other | Admitting: Physical Therapy

## 2011-06-02 NOTE — Telephone Encounter (Signed)
Rx signed and faxed.

## 2011-06-04 ENCOUNTER — Ambulatory Visit: Payer: Medicare Other | Admitting: Physical Therapy

## 2011-06-09 ENCOUNTER — Ambulatory Visit: Payer: Medicare Other | Admitting: Physical Therapy

## 2011-06-11 ENCOUNTER — Encounter: Payer: Medicare Other | Admitting: Rehabilitative and Restorative Service Providers"

## 2011-06-11 ENCOUNTER — Encounter: Payer: Medicare Other | Admitting: Physical Therapy

## 2011-06-16 ENCOUNTER — Ambulatory Visit: Payer: Medicare Other | Admitting: Rehabilitative and Restorative Service Providers"

## 2011-06-18 ENCOUNTER — Encounter: Payer: Medicare Other | Admitting: Rehabilitative and Restorative Service Providers"

## 2011-08-06 ENCOUNTER — Encounter: Payer: Self-pay | Admitting: Internal Medicine

## 2011-08-06 ENCOUNTER — Ambulatory Visit (INDEPENDENT_AMBULATORY_CARE_PROVIDER_SITE_OTHER): Payer: BC Managed Care – PPO | Admitting: Internal Medicine

## 2011-08-06 VITALS — BP 122/70 | HR 76 | Temp 98.2°F | Resp 16 | Wt 188.0 lb

## 2011-08-06 DIAGNOSIS — M25569 Pain in unspecified knee: Secondary | ICD-10-CM

## 2011-08-06 DIAGNOSIS — I10 Essential (primary) hypertension: Secondary | ICD-10-CM

## 2011-08-06 DIAGNOSIS — F4321 Adjustment disorder with depressed mood: Secondary | ICD-10-CM

## 2011-08-06 DIAGNOSIS — K219 Gastro-esophageal reflux disease without esophagitis: Secondary | ICD-10-CM

## 2011-08-06 DIAGNOSIS — M542 Cervicalgia: Secondary | ICD-10-CM

## 2011-08-06 DIAGNOSIS — M25561 Pain in right knee: Secondary | ICD-10-CM

## 2011-08-06 DIAGNOSIS — F3289 Other specified depressive episodes: Secondary | ICD-10-CM

## 2011-08-06 DIAGNOSIS — F329 Major depressive disorder, single episode, unspecified: Secondary | ICD-10-CM

## 2011-08-06 MED ORDER — FLUTICASONE-SALMETEROL 250-50 MCG/DOSE IN AEPB: 1.0000 | INHALATION_SPRAY | Freq: Two times a day (BID) | RESPIRATORY_TRACT | Status: DC

## 2011-08-06 MED ORDER — AMITRIPTYLINE HCL 25 MG PO TABS: 25.0000 mg | ORAL_TABLET | Freq: Every day | ORAL | Status: DC

## 2011-08-06 MED ORDER — DEXLANSOPRAZOLE 30 MG PO CPDR: 30.0000 mg | DELAYED_RELEASE_CAPSULE | Freq: Every day | ORAL | Status: DC

## 2011-08-06 NOTE — Assessment & Plan Note (Signed)
On Dexilant now

## 2011-08-06 NOTE — Assessment & Plan Note (Signed)
Continue with current prescription therapy as reflected on the Med list.  

## 2011-08-06 NOTE — Assessment & Plan Note (Signed)
Discussed diet  

## 2011-08-06 NOTE — Assessment & Plan Note (Signed)
6/13 likely [facility] EKG Needs posture improvement - poss PT X ray Trigger point inj

## 2011-08-06 NOTE — Assessment & Plan Note (Signed)
Getting better slowly Continue with current prescription therapy as reflected on the Med list.

## 2011-08-06 NOTE — Assessment & Plan Note (Addendum)
08-Aug-2011  son died 66 yo Discussed

## 2011-08-06 NOTE — Progress Notes (Signed)
Patient ID: Mallory Klein, female   DOB: [birth year], 66 y.o.   MRN: [id number]  Subjective:    Patient ID: Mallory Klein, female    DOB: 1946/01/10, 66 y.o.   MRN: [id number]  Neck Pain  The current episode started in the past 7 days. The problem occurs every several days. The problem has been waxing and waning. The pain is associated with nothing. The pain is present in the left side. The quality of the pain is described as stabbing. The pain is at a severity of 9/10. The pain is severe. The symptoms are aggravated by position. Pertinent negatives include no chest pain, fever, headaches, numbness, trouble swallowing or weakness.    Her son died recently 23 yo The patient needs to address  chronic hypertension that has been well controlled with medicines; to address chronic  hyperlipidemia controlled with medicines as well; and to address type 2 chronic diabetes, controlled with medical treatment and diet. R TKR Dr Josefine Class in [place]  BP Readings from Last 3 Encounters:  08/06/11 122/70  03/02/11 130/84  09/08/10 120/78   Wt Readings from Last 3 Encounters:  08/06/11 188 lb (85.276 kg)  03/02/11 181 lb (82.101 kg)  09/08/10 174 lb (78.926 kg)      Review of Systems  Constitutional: Negative.  Negative for fever, chills, diaphoresis, activity change, appetite change, fatigue and unexpected weight change.  HENT: Positive for neck pain. Negative for hearing loss, ear pain, nosebleeds, congestion, sore throat, facial swelling, rhinorrhea, sneezing, mouth sores, trouble swallowing, neck stiffness, postnasal drip, sinus pressure and tinnitus.   Eyes: Negative for pain, discharge, redness, itching and visual disturbance.  Respiratory: Negative for cough, chest tightness, shortness of breath, wheezing and stridor.   Cardiovascular: Negative for chest pain, palpitations and leg swelling.  Gastrointestinal: Negative for nausea, diarrhea, constipation, blood in stool, abdominal  distention, anal bleeding and rectal pain.  Genitourinary: Negative for dysuria, urgency, frequency, hematuria, flank pain, vaginal bleeding, vaginal discharge, difficulty urinating, genital sores and pelvic pain.  Musculoskeletal: Positive for arthralgias and gait problem. Negative for back pain and joint swelling.       R knee pain  Skin: Negative.  Negative for rash.  Neurological: Negative for dizziness, tremors, seizures, syncope, speech difficulty, weakness, numbness and headaches.  Hematological: Negative for adenopathy. Does not bruise/bleed easily.  Psychiatric/Behavioral: Positive for disturbed wake/sleep cycle. Negative for suicidal ideas, behavioral problems, dysphoric mood and decreased concentration. The patient is not nervous/anxious.        Objective:   Physical Exam  Constitutional: She appears well-developed. No distress.       Obese  HENT:  Head: Normocephalic.  Right Ear: External ear normal.  Left Ear: External ear normal.  Nose: Nose normal.  Mouth/Throat: Oropharynx is clear and moist.  Eyes: Conjunctivae are normal. Pupils are equal, round, and reactive to light. Right eye exhibits no discharge. Left eye exhibits no discharge.  Neck: Normal range of motion. Neck supple. No JVD present. No tracheal deviation present. No thyromegaly present.  Cardiovascular: Normal rate, regular rhythm and normal heart sounds.  Exam reveals no gallop and no friction rub.   No murmur heard. Pulmonary/Chest: No stridor. No respiratory distress. She has no wheezes.  Abdominal: Soft. Bowel sounds are normal. She exhibits no distension and no mass. There is no tenderness. There is no rebound and no guarding.  Musculoskeletal: She exhibits tenderness (R knee is stiff). She exhibits no edema.  Lymphadenopathy:    She has no cervical adenopathy.  Neurological: She displays normal reflexes. No cranial nerve deficit. She exhibits normal muscle tone. Coordination normal.  Skin: No rash  noted. No erythema. No pallor.  Psychiatric: She has a normal mood and affect. Her behavior is normal. Judgment and thought content normal.  L shoulder is  W/a spastic trap - tender L shoulder is 1-2 inches higher Neck ROM is ok Lab Results  Component Value Date   WBC 8.4 03/04/2011   HGB 13.1 03/04/2011   HCT 41.2 03/04/2011   PLT 353.0 03/04/2011   GLUCOSE 111* 03/04/2011   CHOL 220* 02/10/2010   TRIG 88.0 02/10/2010   HDL 85.10 02/10/2010   LDLDIRECT 109.8 02/10/2010   LDLCALC 111* 04/06/2006   ALT 18 03/04/2011   AST 19 03/04/2011   NA 142 03/04/2011   K 4.3 03/04/2011   CL 105 03/04/2011   CREATININE 0.9 03/04/2011   BUN 16 03/04/2011   CO2 32 03/04/2011   TSH 1.46 03/04/2011   HGBA1C 5.9 03/04/2011   EKG is OK   Procedure Note :    Trigger Point Injection   Indication : Focal tender area identifiable by the location without other identifiable neurologic or musculoskeletal finding or pathology.   Risks including unsuccessful procedure , bleeding, infection, bruising, skin atrophy and others were explained to the patient in detail as well as the benefits. Informed consent was obtained and signed.   Tthe patient was placed in a comfortable position.  3  points of maximum tenderness over L trapezius muscle were marked and  the skin was prepped with Betadine and alcohol. 1 inch 25-gauge needle was used. The needle was advanced perpendicular to the skin. Each trigger point was injected with 1 mL of 2% lidocaine and 15 mg of Depo-Medrol in a usual fashion.  Band-Aids applied.   Tolerated well. Complications: None. Good pain relief following the procedure.     Assessment & Plan:

## 2011-08-07 ENCOUNTER — Ambulatory Visit (INDEPENDENT_AMBULATORY_CARE_PROVIDER_SITE_OTHER)
Admission: RE | Admit: 2011-08-07 | Discharge: 2011-08-07 | Disposition: A | Discharge status: Home / Self Care | Payer: Medicare Other | Source: Ambulatory Visit | Attending: Internal Medicine | Admitting: Internal Medicine

## 2011-08-07 ENCOUNTER — Telehealth: Payer: Self-pay | Admitting: Internal Medicine

## 2011-08-07 ENCOUNTER — Other Ambulatory Visit: Payer: Self-pay | Admitting: *Deleted

## 2011-08-07 DIAGNOSIS — M542 Cervicalgia: Secondary | ICD-10-CM

## 2011-08-07 IMAGING — CR DG CERVICAL SPINE COMPLETE 4+V
6 series · 6 of 6 positions shown · non-contrast
Comparison: None

CLINICAL DATA: History of neck pain.  No history of injury.

CERVICAL SPINE - COMPLETE 4+ VIEW

[view not recorded (1 of 6)]
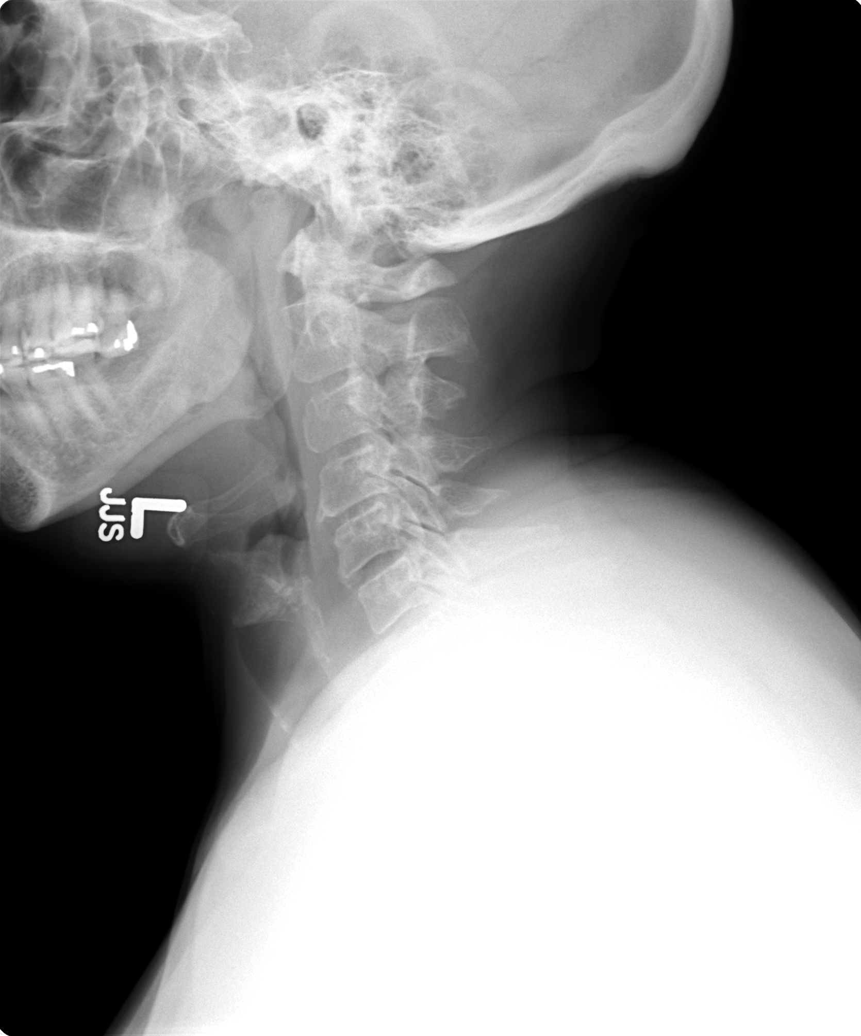

[view not recorded (2 of 6)]
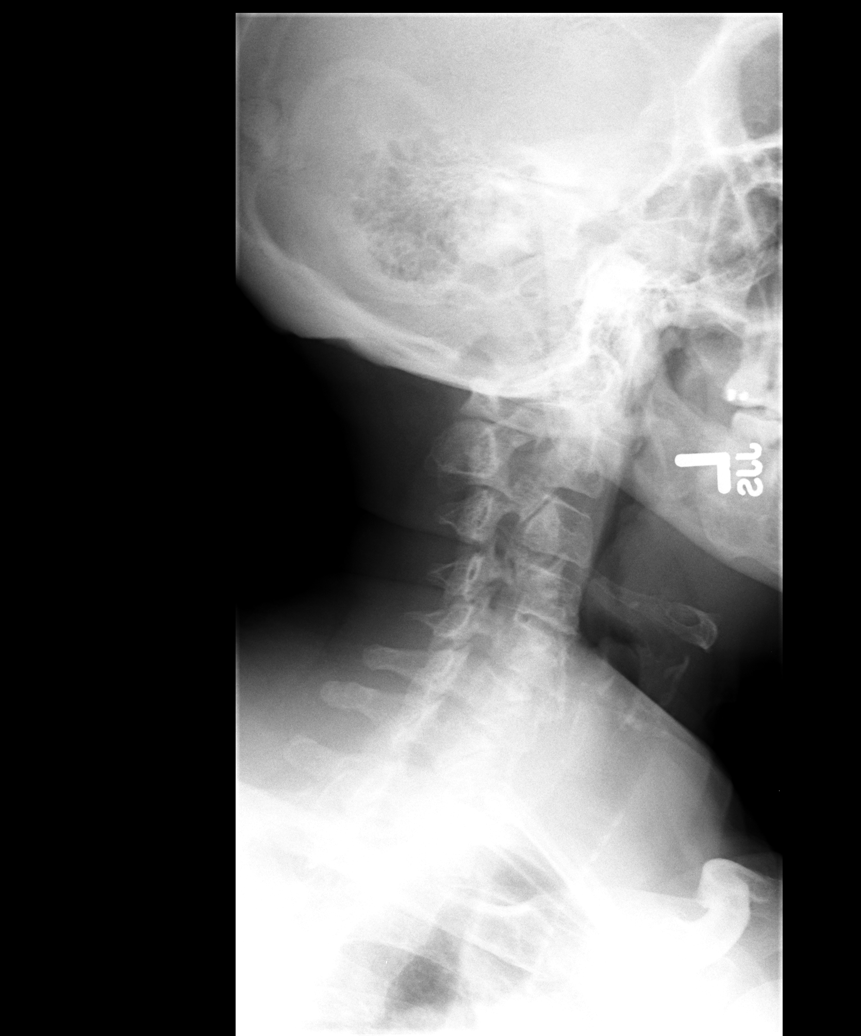

[view not recorded (3 of 6)]
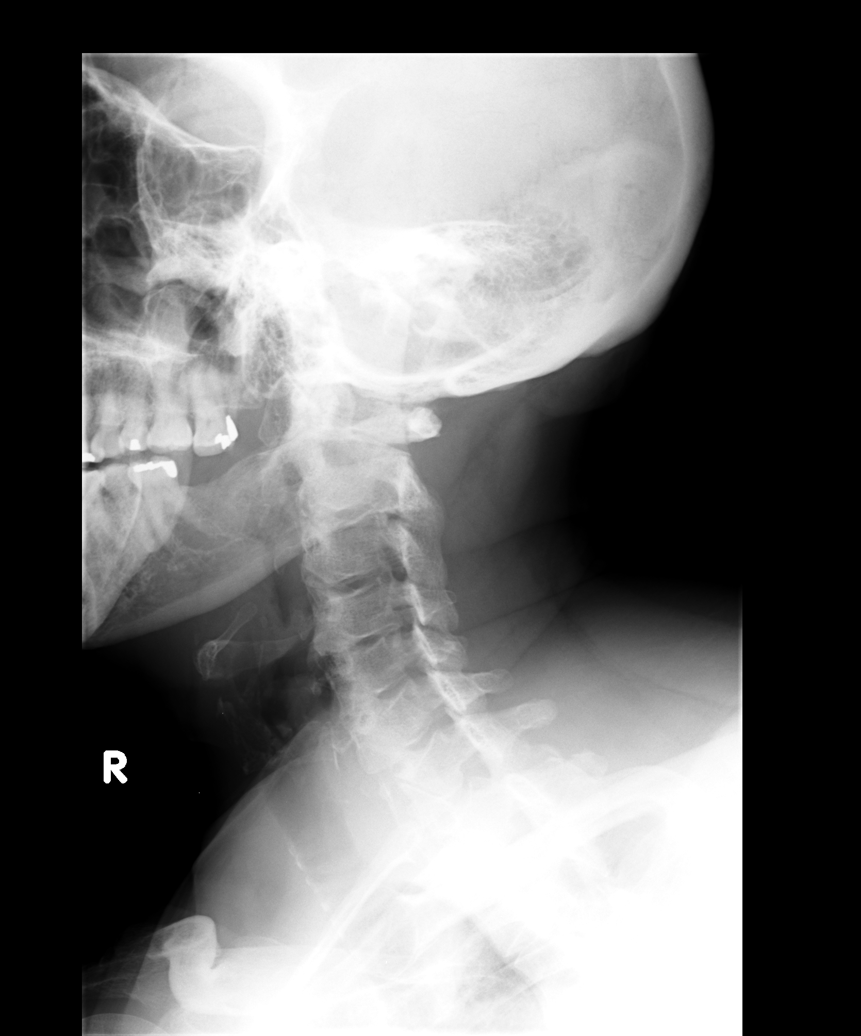

[view not recorded (4 of 6)]
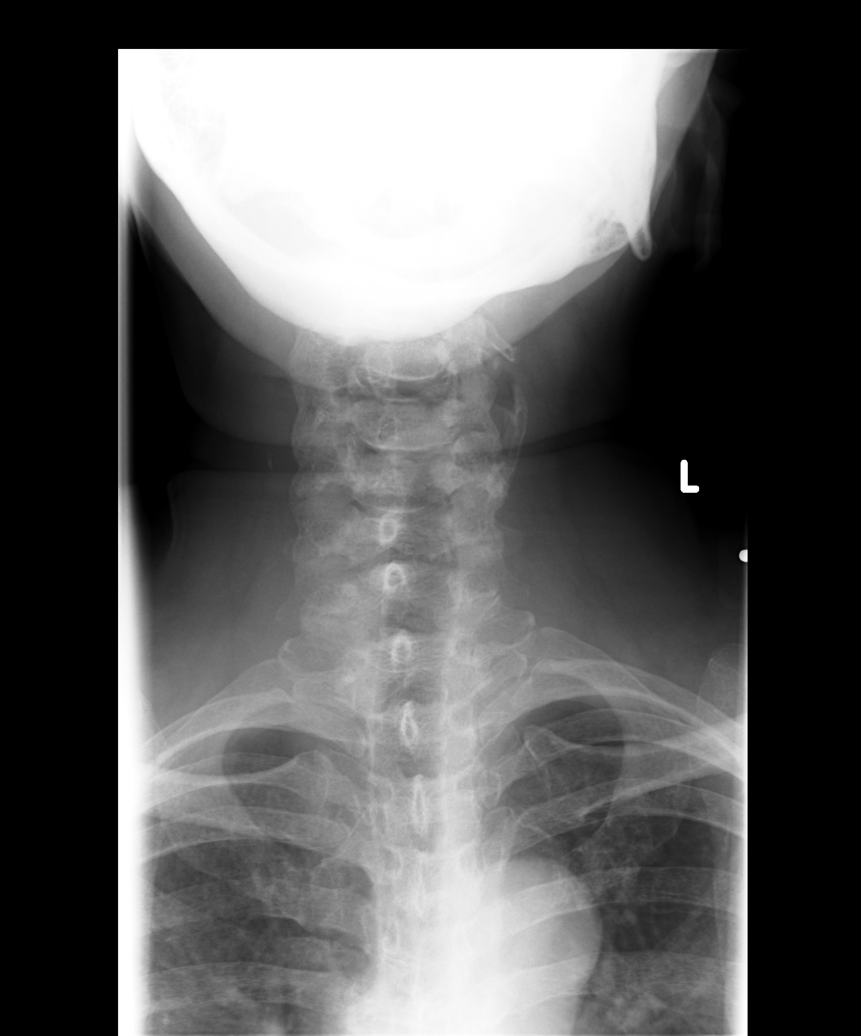

[view not recorded (5 of 6)]
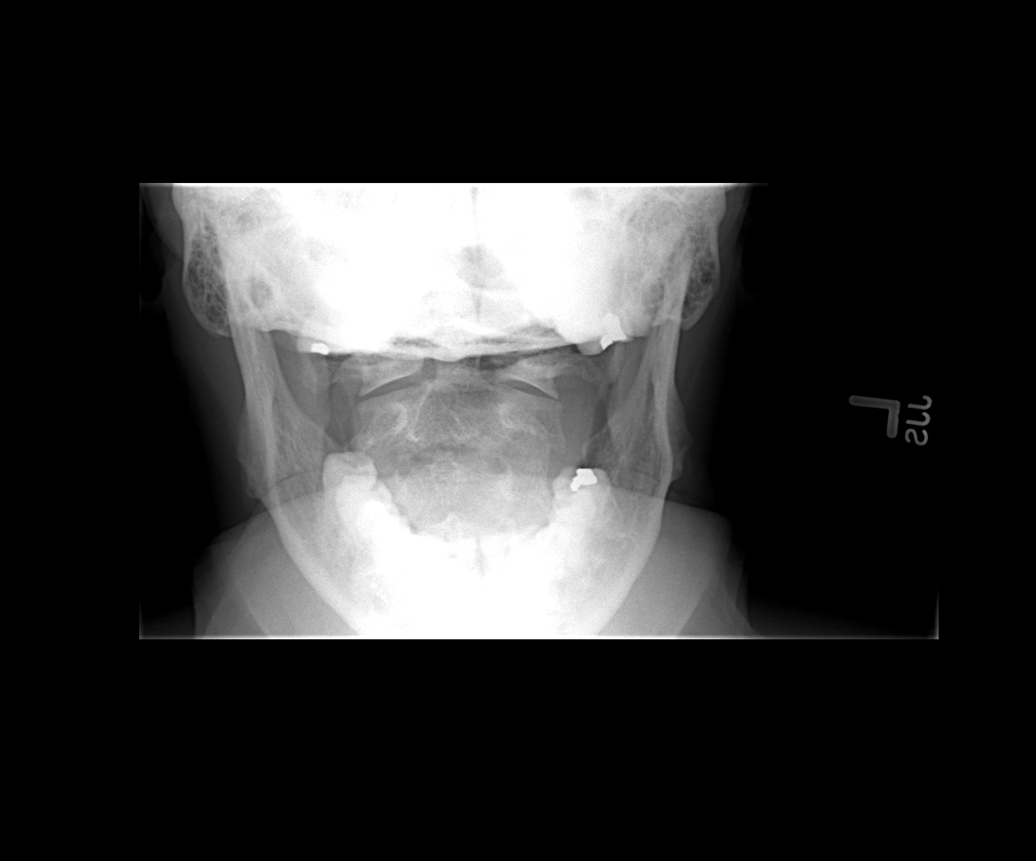

[view not recorded (6 of 6)]
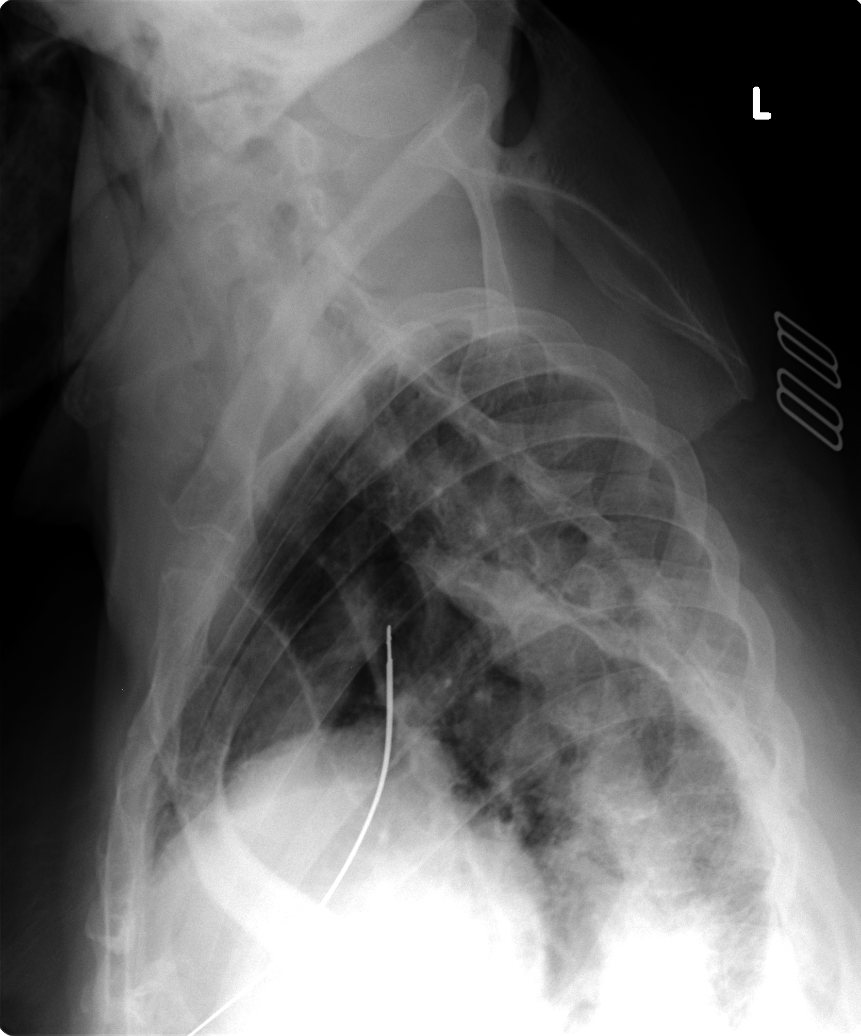

[6 of 6 positions shown; findings below may reference images not displayed]

FINDINGS: No prevertebral soft tissue swelling is evident.  There
is slight narrowing of intervertebral disc space at the level of C4-
C5.  There is marginal osteophyte formation representing
degenerative spondylosis.  There is slight posterior subluxation of
the body of C4 on the body of C5.  Foramina appear patent.  No
cervical ribs are seen.  No fracture or bony destruction is
evident. There is osteopenic appearance of bones.
IMPRESSION: Changes of degenerative spondylosis. Slight posterior subluxation
of the body of C4 on the body of C5.  Osteopenic appearance of the
bones.

## 2011-08-07 NOTE — Telephone Encounter (Signed)
Mallory Klein, please, inform patient that her neck has a lot of OA Rx as we discussed Thx

## 2011-08-10 NOTE — Telephone Encounter (Signed)
Left mess for patient to call back.  

## 2011-08-11 ENCOUNTER — Telehealth: Payer: Self-pay | Admitting: *Deleted

## 2011-08-11 MED ORDER — DIAZEPAM 5 MG PO TABS: 5.0000 mg | ORAL_TABLET | Freq: Three times a day (TID) | ORAL | Status: DC | PRN

## 2011-08-11 NOTE — Telephone Encounter (Signed)
pls call in #[phone]

## 2011-08-11 NOTE — Telephone Encounter (Signed)
Pt is req Rf on Diazepam 5 mg. She forgot to ask at recent OV. Please advise in PCP absence. Thanks

## 2011-08-11 NOTE — Telephone Encounter (Signed)
Done

## 2011-08-11 NOTE — Telephone Encounter (Signed)
Pt informed

## 2011-09-01 ENCOUNTER — Ambulatory Visit (INDEPENDENT_AMBULATORY_CARE_PROVIDER_SITE_OTHER): Payer: Medicare Other | Admitting: Internal Medicine

## 2011-09-01 ENCOUNTER — Encounter: Payer: Self-pay | Admitting: Internal Medicine

## 2011-09-01 VITALS — BP 110/80 | HR 80 | Temp 98.5°F | Resp 16 | Wt 192.0 lb

## 2011-09-01 DIAGNOSIS — M542 Cervicalgia: Secondary | ICD-10-CM

## 2011-09-01 DIAGNOSIS — M25569 Pain in unspecified knee: Secondary | ICD-10-CM

## 2011-09-01 DIAGNOSIS — M25561 Pain in right knee: Secondary | ICD-10-CM

## 2011-09-01 DIAGNOSIS — J45909 Unspecified asthma, uncomplicated: Secondary | ICD-10-CM

## 2011-09-01 MED ORDER — VERAPAMIL HCL 180 MG (CO) PO TB24: 180.0000 mg | ORAL_TABLET | Freq: Every day | ORAL | Status: DC

## 2011-09-01 MED ORDER — ESTRADIOL 0.05 MG/24HR TD PTTW: 1.0000 | MEDICATED_PATCH | TRANSDERMAL | Status: DC

## 2011-09-01 MED ORDER — DICLOFENAC SODIUM 1 % TD GEL: 1.0000 "application " | Freq: Four times a day (QID) | TRANSDERMAL | Status: DC

## 2011-09-01 NOTE — Progress Notes (Signed)
Patient ID: Mallory Klein, female   DOB: 11-02-1945, 66 y.o.   MRN: [id number] Patient ID: Mallory Klein, female   DOB: [birth year], 66 y.o.   MRN: [id number]  Subjective:    Patient ID: Mallory Klein, female    DOB: Jun 04, 1945, 66 y.o.   MRN: [id number]  Neck Pain  The current episode started more than 1 month ago. The problem occurs every several days. The problem has been waxing and waning. The pain is associated with nothing. The pain is present in the left side. The quality of the pain is described as stabbing. The pain is at a severity of 3/10. The pain is mild. The symptoms are aggravated by position. Pertinent negatives include no chest pain, fever, headaches, numbness, trouble swallowing or weakness. She has tried NSAIDs for the symptoms. The treatment provided significant relief.    Her son died this year - 63 yo  The patient needs to address  chronic hypertension that has been well controlled with medicines; to address chronic  hyperlipidemia controlled with medicines as well; and to address type 2 chronic diabetes, controlled with medical treatment and diet.  R TKR Dr Josefine Class in [place] 2[place]13  BP Readings from Last 3 Encounters:  09/01/11 110/80  08/06/11 122/70  03/02/11 130/84   Wt Readings from Last 3 Encounters:  09/01/11 192 lb (87.091 kg)  08/06/11 188 lb (85.276 kg)  03/02/11 181 lb (82.101 kg)      Review of Systems  Constitutional: Negative.  Negative for fever, chills, diaphoresis, activity change, appetite change, fatigue and unexpected weight change.  HENT: Positive for neck pain. Negative for hearing loss, ear pain, nosebleeds, congestion, sore throat, facial swelling, rhinorrhea, sneezing, mouth sores, trouble swallowing, neck stiffness, postnasal drip, sinus pressure and tinnitus.   Eyes: Negative for pain, discharge, redness, itching and visual disturbance.  Respiratory: Negative for cough, chest tightness, shortness of breath, wheezing and stridor.     Cardiovascular: Negative for chest pain, palpitations and leg swelling.  Gastrointestinal: Negative for nausea, diarrhea, constipation, blood in stool, abdominal distention, anal bleeding and rectal pain.  Genitourinary: Negative for dysuria, urgency, frequency, hematuria, flank pain, vaginal bleeding, vaginal discharge, difficulty urinating, genital sores and pelvic pain.  Musculoskeletal: Positive for arthralgias and gait problem. Negative for back pain and joint swelling.       R knee pain  Skin: Negative.  Negative for rash.  Neurological: Negative for dizziness, tremors, seizures, syncope, speech difficulty, weakness, numbness and headaches.  Hematological: Negative for adenopathy. Does not bruise/bleed easily.  Psychiatric/Behavioral: Positive for disturbed wake/sleep cycle. Negative for suicidal ideas, behavioral problems, dysphoric mood and decreased concentration. The patient is not nervous/anxious.        Objective:   Physical Exam  Constitutional: She appears well-developed. No distress.       Obese  HENT:  Head: Normocephalic.  Right Ear: External ear normal.  Left Ear: External ear normal.  Nose: Nose normal.  Mouth/Throat: Oropharynx is clear and moist.  Eyes: Conjunctivae are normal. Pupils are equal, round, and reactive to light. Right eye exhibits no discharge. Left eye exhibits no discharge.  Neck: Normal range of motion. Neck supple. No JVD present. No tracheal deviation present. No thyromegaly present.  Cardiovascular: Normal rate, regular rhythm and normal heart sounds.  Exam reveals no gallop and no friction rub.   No murmur heard. Pulmonary/Chest: No stridor. No respiratory distress. She has no wheezes.  Abdominal: Soft. Bowel sounds are normal. She exhibits no distension and no mass. There is  no tenderness. There is no rebound and no guarding.  Musculoskeletal: She exhibits tenderness (R knee is stiff). She exhibits no edema.       Neck is less tender   Lymphadenopathy:    She has no cervical adenopathy.  Neurological: She displays normal reflexes. No cranial nerve deficit. She exhibits normal muscle tone. Coordination normal.  Skin: No rash noted. No erythema. No pallor.  Psychiatric: She has a normal mood and affect. Her behavior is normal. Judgment and thought content normal.  L shoulder is  Better  L shoulder is 1-2 inches higher Neck ROM is ok Lab Results  Component Value Date   WBC 8.4 03/04/2011   HGB 13.1 03/04/2011   HCT 41.2 03/04/2011   PLT 353.0 03/04/2011   GLUCOSE 111* 03/04/2011   CHOL 220* 02/10/2010   TRIG 88.0 02/10/2010   HDL 85.10 02/10/2010   LDLDIRECT 109.8 02/10/2010   LDLCALC 111* 04/06/2006   ALT 18 03/04/2011   AST 19 03/04/2011   NA 142 03/04/2011   K 4.3 03/04/2011   CL 105 03/04/2011   CREATININE 0.9 03/04/2011   BUN 16 03/04/2011   CO2 32 03/04/2011   TSH 1.46 03/04/2011   HGBA1C 5.9 03/04/2011   EKG is OK       Assessment & Plan:

## 2011-09-01 NOTE — Assessment & Plan Note (Signed)
S/p TKR R 2/13

## 2011-09-01 NOTE — Assessment & Plan Note (Signed)
Continue with current prescription therapy as reflected on the Med list.  

## 2011-09-01 NOTE — Assessment & Plan Note (Signed)
  On diet  

## 2011-09-01 NOTE — Assessment & Plan Note (Signed)
Better w/trigger point inj

## 2011-10-07 DIAGNOSIS — M24561 Contracture, right knee: Secondary | ICD-10-CM | POA: Insufficient documentation

## 2011-10-21 ENCOUNTER — Telehealth: Payer: Self-pay | Admitting: Internal Medicine

## 2011-10-21 NOTE — Telephone Encounter (Signed)
OK to fill this prescription with additional refills x3 Thank you!  

## 2011-10-21 NOTE — Telephone Encounter (Signed)
Needs refill on Voltaren Gel 1%, please call into [facility] on [address], please contact pt once sent, may leave message

## 2011-10-22 MED ORDER — DICLOFENAC SODIUM 1 % TD GEL: 1.0000 "application " | Freq: Four times a day (QID) | TRANSDERMAL | Status: DC

## 2011-10-22 NOTE — Telephone Encounter (Signed)
Script faxed in to pharmacy 10/22/11 @ 10:40am

## 2011-11-13 ENCOUNTER — Telehealth: Payer: Self-pay

## 2011-11-13 NOTE — Telephone Encounter (Signed)
Received fax from pharmacy stating that insurance will not cover  Volteren gel  w/o a prior auth. Need to  call insurance  to proceed with request . Form received/ completed and pending singature

## 2011-11-17 NOTE — Telephone Encounter (Signed)
PA approved 10/14/11-11/13/11. Pharmacy informed.

## 2011-11-24 ENCOUNTER — Ambulatory Visit: Payer: Medicare Other | Attending: Orthopedic Surgery | Admitting: Rehabilitative and Restorative Service Providers"

## 2011-11-24 DIAGNOSIS — Z96659 Presence of unspecified artificial knee joint: Secondary | ICD-10-CM | POA: Insufficient documentation

## 2011-11-24 DIAGNOSIS — IMO0001 Reserved for inherently not codable concepts without codable children: Secondary | ICD-10-CM | POA: Insufficient documentation

## 2011-11-24 DIAGNOSIS — M25669 Stiffness of unspecified knee, not elsewhere classified: Secondary | ICD-10-CM | POA: Insufficient documentation

## 2011-11-24 DIAGNOSIS — M6281 Muscle weakness (generalized): Secondary | ICD-10-CM | POA: Insufficient documentation

## 2011-11-27 ENCOUNTER — Ambulatory Visit: Payer: Medicare Other | Admitting: Rehabilitation

## 2011-12-01 ENCOUNTER — Ambulatory Visit: Payer: Medicare Other | Admitting: Physical Therapy

## 2011-12-03 ENCOUNTER — Ambulatory Visit: Payer: Medicare Other | Admitting: Rehabilitative and Restorative Service Providers"

## 2011-12-04 ENCOUNTER — Ambulatory Visit (INDEPENDENT_AMBULATORY_CARE_PROVIDER_SITE_OTHER): Payer: Medicare Other | Admitting: Internal Medicine

## 2011-12-04 ENCOUNTER — Encounter: Payer: Self-pay | Admitting: Internal Medicine

## 2011-12-04 VITALS — BP 120/88 | HR 80 | Temp 98.3°F | Resp 16 | Wt 200.0 lb

## 2011-12-04 DIAGNOSIS — Z23 Encounter for immunization: Secondary | ICD-10-CM

## 2011-12-04 DIAGNOSIS — M25561 Pain in right knee: Secondary | ICD-10-CM

## 2011-12-04 DIAGNOSIS — R61 Generalized hyperhidrosis: Secondary | ICD-10-CM

## 2011-12-04 DIAGNOSIS — M25569 Pain in unspecified knee: Secondary | ICD-10-CM

## 2011-12-04 DIAGNOSIS — R21 Rash and other nonspecific skin eruption: Secondary | ICD-10-CM

## 2011-12-04 DIAGNOSIS — N39498 Other specified urinary incontinence: Secondary | ICD-10-CM

## 2011-12-04 MED ORDER — CLOTRIMAZOLE-BETAMETHASONE 1-0.05 % EX CREA: TOPICAL_CREAM | Freq: Two times a day (BID) | CUTANEOUS | Status: DC

## 2011-12-04 MED ORDER — OXYBUTYNIN CHLORIDE 10 % TD GEL: TRANSDERMAL | Status: DC

## 2011-12-04 MED ORDER — ESTRADIOL 0.5 MG PO TABS: 0.5000 mg | ORAL_TABLET | Freq: Every day | ORAL | Status: DC

## 2011-12-04 NOTE — Assessment & Plan Note (Signed)
Continue with current prescription therapy as reflected on the Med list.  

## 2011-12-04 NOTE — Assessment & Plan Note (Signed)
Discussed.

## 2011-12-04 NOTE — Progress Notes (Signed)
Subjective:    Patient ID: Mallory Klein, female    DOB: Apr 23, 1945, 66 y.o.   MRN: [id number]  HPI F/u neck pain - better C/o L elbow rash C/o hot flashes and urinary incontinence Her son died this year - 31 yo  The patient needs to address  chronic hypertension that has been well controlled with medicines; to address chronic  hyperlipidemia controlled with medicines as well; and to address type 2 chronic diabetes, controlled with medical treatment and diet.  R TKR Dr Josefine Class in [place] 2[place]13  BP Readings from Last 3 Encounters:  12/04/11 120/88  09/01/11 110/80  08/06/11 122/70   Wt Readings from Last 3 Encounters:  12/04/11 200 lb (90.719 kg)  09/01/11 192 lb (87.091 kg)  08/06/11 188 lb (85.276 kg)      Review of Systems  Constitutional: Negative.  Negative for chills, diaphoresis, activity change, appetite change, fatigue and unexpected weight change.  HENT: Negative for hearing loss, ear pain, nosebleeds, congestion, sore throat, facial swelling, rhinorrhea, sneezing, mouth sores, neck stiffness, postnasal drip, sinus pressure and tinnitus.   Eyes: Negative for pain, discharge, redness, itching and visual disturbance.  Respiratory: Negative for cough, chest tightness, shortness of breath, wheezing and stridor.   Cardiovascular: Negative for palpitations and leg swelling.  Gastrointestinal: Negative for nausea, diarrhea, constipation, blood in stool, abdominal distention, anal bleeding and rectal pain.  Genitourinary: Negative for dysuria, urgency, frequency, hematuria, flank pain, vaginal bleeding, vaginal discharge, difficulty urinating, genital sores and pelvic pain.  Musculoskeletal: Positive for arthralgias and gait problem. Negative for back pain and joint swelling.       R knee pain  Skin: Negative.  Negative for rash.  Neurological: Negative for dizziness, tremors, seizures, syncope and speech difficulty.  Hematological: Negative for adenopathy. Does not  bruise/bleed easily.  Psychiatric/Behavioral: Positive for disturbed wake/sleep cycle. Negative for suicidal ideas, behavioral problems, dysphoric mood and decreased concentration. The patient is not nervous/anxious.        Objective:   Physical Exam  Constitutional: She appears well-developed. No distress.       Obese  HENT:  Head: Normocephalic.  Right Ear: External ear normal.  Left Ear: External ear normal.  Nose: Nose normal.  Mouth/Throat: Oropharynx is clear and moist.  Eyes: Conjunctivae normal are normal. Pupils are equal, round, and reactive to light. Right eye exhibits no discharge. Left eye exhibits no discharge.  Neck: Normal range of motion. Neck supple. No JVD present. No tracheal deviation present. No thyromegaly present.  Cardiovascular: Normal rate, regular rhythm and normal heart sounds.  Exam reveals no gallop and no friction rub.   No murmur heard. Pulmonary/Chest: No stridor. No respiratory distress. She has no wheezes.  Abdominal: Soft. Bowel sounds are normal. She exhibits no distension and no mass. There is no tenderness. There is no rebound and no guarding.  Musculoskeletal: She exhibits tenderness (R knee is stiff). She exhibits no edema.       Neck is less tender  Lymphadenopathy:    She has no cervical adenopathy.  Neurological: She displays normal reflexes. No cranial nerve deficit. She exhibits normal muscle tone. Coordination normal.  Skin: No rash noted. No erythema. No pallor.  Psychiatric: She has a normal mood and affect. Her behavior is normal. Judgment and thought content normal.  L shoulder is  Better  L shoulder is 1 inch higher Neck ROM is ok  Lab Results  Component Value Date   WBC 8.4 03/04/2011   HGB 13.1 03/04/2011  HCT 41.2 03/04/2011   PLT 353.0 03/04/2011   GLUCOSE 111* 03/04/2011   CHOL 220* 02/10/2010   TRIG 88.0 02/10/2010   HDL 85.10 02/10/2010   LDLDIRECT 109.8 02/10/2010   LDLCALC 111* 04/06/2006   ALT 18 03/04/2011   AST 19  03/04/2011   NA 142 03/04/2011   K 4.3 03/04/2011   CL 105 03/04/2011   CREATININE 0.9 03/04/2011   BUN 16 03/04/2011   CO2 32 03/04/2011   TSH 1.46 03/04/2011   HGBA1C 5.9 03/04/2011   EKG is OK       Assessment & Plan:

## 2011-12-04 NOTE — Assessment & Plan Note (Signed)
Gelniq qd

## 2011-12-04 NOTE — Assessment & Plan Note (Signed)
lotrisone bid  °

## 2011-12-07 ENCOUNTER — Ambulatory Visit: Payer: Medicare Other | Admitting: Rehabilitative and Restorative Service Providers"

## 2011-12-10 ENCOUNTER — Ambulatory Visit: Payer: Medicare Other | Admitting: Rehabilitative and Restorative Service Providers"

## 2011-12-14 ENCOUNTER — Ambulatory Visit: Payer: Medicare Other | Admitting: Rehabilitative and Restorative Service Providers"

## 2011-12-17 ENCOUNTER — Ambulatory Visit: Payer: Medicare Other | Admitting: Rehabilitative and Restorative Service Providers"

## 2011-12-21 ENCOUNTER — Encounter: Payer: Medicare Other | Admitting: Rehabilitative and Restorative Service Providers"

## 2011-12-24 ENCOUNTER — Ambulatory Visit: Payer: Medicare Other | Admitting: Physical Therapy

## 2011-12-28 ENCOUNTER — Encounter: Payer: Medicare Other | Admitting: Physical Therapy

## 2011-12-30 ENCOUNTER — Encounter: Payer: Medicare Other | Admitting: Rehabilitative and Restorative Service Providers"

## 2012-01-01 ENCOUNTER — Encounter: Payer: Self-pay | Admitting: Internal Medicine

## 2012-01-04 ENCOUNTER — Ambulatory Visit: Payer: Medicare Other | Attending: Orthopedic Surgery | Admitting: Rehabilitative and Restorative Service Providers"

## 2012-01-04 DIAGNOSIS — M25669 Stiffness of unspecified knee, not elsewhere classified: Secondary | ICD-10-CM | POA: Insufficient documentation

## 2012-01-04 DIAGNOSIS — IMO0001 Reserved for inherently not codable concepts without codable children: Secondary | ICD-10-CM | POA: Insufficient documentation

## 2012-01-04 DIAGNOSIS — M6281 Muscle weakness (generalized): Secondary | ICD-10-CM | POA: Insufficient documentation

## 2012-01-04 DIAGNOSIS — Z96659 Presence of unspecified artificial knee joint: Secondary | ICD-10-CM | POA: Insufficient documentation

## 2012-01-07 ENCOUNTER — Encounter: Payer: Medicare Other | Admitting: Rehabilitative and Restorative Service Providers"

## 2012-02-09 ENCOUNTER — Ambulatory Visit: Payer: Medicare Other | Attending: Orthopedic Surgery | Admitting: Physical Therapy

## 2012-02-09 DIAGNOSIS — M6281 Muscle weakness (generalized): Secondary | ICD-10-CM | POA: Insufficient documentation

## 2012-02-09 DIAGNOSIS — M25669 Stiffness of unspecified knee, not elsewhere classified: Secondary | ICD-10-CM | POA: Insufficient documentation

## 2012-02-09 DIAGNOSIS — Z96659 Presence of unspecified artificial knee joint: Secondary | ICD-10-CM | POA: Insufficient documentation

## 2012-02-09 DIAGNOSIS — IMO0001 Reserved for inherently not codable concepts without codable children: Secondary | ICD-10-CM | POA: Insufficient documentation

## 2012-02-11 ENCOUNTER — Ambulatory Visit: Payer: Medicare Other | Admitting: Physical Therapy

## 2012-02-15 ENCOUNTER — Encounter: Payer: Medicare Other | Admitting: Physical Therapy

## 2012-02-22 ENCOUNTER — Ambulatory Visit: Payer: Medicare Other | Admitting: Physical Therapy

## 2012-02-25 ENCOUNTER — Ambulatory Visit: Payer: Medicare Other | Attending: Orthopedic Surgery | Admitting: Physical Therapy

## 2012-02-25 DIAGNOSIS — M6281 Muscle weakness (generalized): Secondary | ICD-10-CM | POA: Insufficient documentation

## 2012-02-25 DIAGNOSIS — M25669 Stiffness of unspecified knee, not elsewhere classified: Secondary | ICD-10-CM | POA: Insufficient documentation

## 2012-02-25 DIAGNOSIS — IMO0001 Reserved for inherently not codable concepts without codable children: Secondary | ICD-10-CM | POA: Insufficient documentation

## 2012-02-25 DIAGNOSIS — Z96659 Presence of unspecified artificial knee joint: Secondary | ICD-10-CM | POA: Insufficient documentation

## 2012-02-26 ENCOUNTER — Telehealth: Payer: Self-pay | Admitting: Internal Medicine

## 2012-02-29 ENCOUNTER — Ambulatory Visit: Payer: Medicare Other | Admitting: Physical Therapy

## 2012-02-29 NOTE — Telephone Encounter (Signed)
Left message asking patient to clarify process for refilling rx through [facility].  I asked to her to call me if there was paperwork I needed to fill out

## 2012-03-01 ENCOUNTER — Telehealth: Payer: Self-pay

## 2012-03-01 NOTE — Telephone Encounter (Signed)
Pt called to inform me that [facility] claimed they had mailed [place] the paperwork for her Rx; I told her we did not have it here and to see if they would fax it directly to me.  Pt agreed to call them and I told her I would watch for the fax, at which time I would fill out the paperwork for her

## 2012-03-02 ENCOUNTER — Encounter: Payer: Medicare Other | Admitting: Physical Therapy

## 2012-03-03 ENCOUNTER — Ambulatory Visit: Payer: Medicare Other | Admitting: Physical Therapy

## 2012-03-07 ENCOUNTER — Ambulatory Visit: Payer: Medicare Other | Admitting: Physical Therapy

## 2012-03-08 ENCOUNTER — Encounter: Payer: Self-pay | Admitting: Internal Medicine

## 2012-03-08 ENCOUNTER — Ambulatory Visit (INDEPENDENT_AMBULATORY_CARE_PROVIDER_SITE_OTHER): Payer: Medicare Other | Admitting: Internal Medicine

## 2012-03-08 ENCOUNTER — Telehealth: Payer: Self-pay

## 2012-03-08 VITALS — BP 130/90 | HR 72 | Temp 97.6°F | Resp 16 | Wt 199.0 lb

## 2012-03-08 DIAGNOSIS — M25569 Pain in unspecified knee: Secondary | ICD-10-CM

## 2012-03-08 DIAGNOSIS — L97409 Non-pressure chronic ulcer of unspecified heel and midfoot with unspecified severity: Secondary | ICD-10-CM

## 2012-03-08 DIAGNOSIS — R739 Hyperglycemia, unspecified: Secondary | ICD-10-CM

## 2012-03-08 DIAGNOSIS — F4321 Adjustment disorder with depressed mood: Secondary | ICD-10-CM

## 2012-03-08 DIAGNOSIS — M25561 Pain in right knee: Secondary | ICD-10-CM

## 2012-03-08 DIAGNOSIS — R7309 Other abnormal glucose: Secondary | ICD-10-CM

## 2012-03-08 DIAGNOSIS — I10 Essential (primary) hypertension: Secondary | ICD-10-CM

## 2012-03-08 MED ORDER — CYCLOBENZAPRINE HCL 5 MG PO TABS: 5.0000 mg | ORAL_TABLET | Freq: Two times a day (BID) | ORAL | Status: DC | PRN

## 2012-03-08 NOTE — Progress Notes (Signed)
Subjective:    Patient ID: Mallory Klein, female    DOB: Aug 22, 1945, 67 y.o.   MRN: [id number]  HPI F/u neck pain - better F/u on L elbow rash, hot flashes and urinary incontinence Her son died last year - 76 yo  The patient needs to address  chronic hypertension that has been well controlled with medicines; to address chronic  hyperlipidemia controlled with medicines as well; and to address type 2 chronic diabetes, controlled with medical treatment and diet.  R TKR Dr Josefine Class in [place] 2[place]13. She had a manipulation in August - it did not work...  C/o a blister on R foot from the cast  BP Readings from Last 3 Encounters:  03/08/12 130/90  12/04/11 120/88  09/01/11 110/80   Wt Readings from Last 3 Encounters:  03/08/12 199 lb (90.266 kg)  12/04/11 200 lb (90.719 kg)  09/01/11 192 lb (87.091 kg)      Review of Systems  Constitutional: Negative.  Negative for chills, diaphoresis, activity change, appetite change, fatigue and unexpected weight change.  HENT: Negative for hearing loss, ear pain, nosebleeds, congestion, sore throat, facial swelling, rhinorrhea, sneezing, mouth sores, neck stiffness, postnasal drip, sinus pressure and tinnitus.   Eyes: Negative for pain, discharge, redness, itching and visual disturbance.  Respiratory: Negative for cough, chest tightness, shortness of breath, wheezing and stridor.   Cardiovascular: Negative for palpitations and leg swelling.  Gastrointestinal: Negative for nausea, diarrhea, constipation, blood in stool, abdominal distention, anal bleeding and rectal pain.  Genitourinary: Negative for dysuria, urgency, frequency, hematuria, flank pain, vaginal bleeding, vaginal discharge, difficulty urinating, genital sores and pelvic pain.  Musculoskeletal: Positive for arthralgias and gait problem. Negative for back pain and joint swelling.       R knee pain  Skin: Negative.  Negative for rash.  Neurological: Negative for dizziness, tremors,  seizures, syncope and speech difficulty.  Hematological: Negative for adenopathy. Does not bruise/bleed easily.  Psychiatric/Behavioral: Positive for sleep disturbance. Negative for suicidal ideas, behavioral problems, dysphoric mood and decreased concentration. The patient is not nervous/anxious.        Objective:   Physical Exam  Constitutional: She appears well-developed. No distress.       Obese  HENT:  Head: Normocephalic.  Right Ear: External ear normal.  Left Ear: External ear normal.  Nose: Nose normal.  Mouth/Throat: Oropharynx is clear and moist.  Eyes: Conjunctivae normal are normal. Pupils are equal, round, and reactive to light. Right eye exhibits no discharge. Left eye exhibits no discharge.  Neck: Normal range of motion. Neck supple. No JVD present. No tracheal deviation present. No thyromegaly present.  Cardiovascular: Normal rate, regular rhythm and normal heart sounds.  Exam reveals no gallop and no friction rub.   No murmur heard. Pulmonary/Chest: No stridor. No respiratory distress. She has no wheezes.  Abdominal: Soft. Bowel sounds are normal. She exhibits no distension and no mass. There is no tenderness. There is no rebound and no guarding.  Musculoskeletal: She exhibits tenderness (R knee is stiff). She exhibits no edema.       Neck is less tender  Lymphadenopathy:    She has no cervical adenopathy.  Neurological: She displays normal reflexes. No cranial nerve deficit. She exhibits normal muscle tone. Coordination normal.  Skin: No rash noted. No erythema. No pallor.  Psychiatric: She has a normal mood and affect. Her behavior is normal. Judgment and thought content normal.  L shoulder is  Better  L shoulder is 1 inch higher Neck  ROM is ok R heel has a large ulcer w/necrotic edges  Lab Results  Component Value Date   WBC 8.4 03/04/2011   HGB 13.1 03/04/2011   HCT 41.2 03/04/2011   PLT 353.0 03/04/2011   GLUCOSE 111* 03/04/2011   CHOL 220* 02/10/2010   TRIG 88.0  02/10/2010   HDL 85.10 02/10/2010   LDLDIRECT 109.8 02/10/2010   LDLCALC 111* 04/06/2006   ALT 18 03/04/2011   AST 19 03/04/2011   NA 142 03/04/2011   K 4.3 03/04/2011   CL 105 03/04/2011   CREATININE 0.9 03/04/2011   BUN 16 03/04/2011   CO2 32 03/04/2011   TSH 1.46 03/04/2011   HGBA1C 5.9 03/04/2011          Assessment & Plan:

## 2012-03-08 NOTE — Assessment & Plan Note (Signed)
Chronic severe OA S/p TKR R 2/13, manipulation 8/13

## 2012-03-08 NOTE — Assessment & Plan Note (Signed)
Wt Readings from Last 3 Encounters:  03/08/12 199 lb (90.266 kg)  12/04/11 200 lb (90.719 kg)  09/01/11 192 lb (87.091 kg)

## 2012-03-08 NOTE — Assessment & Plan Note (Signed)
1/14 R Chronic  Wound clinic consult

## 2012-03-08 NOTE — Telephone Encounter (Signed)
Called patient to let her know that Per Dr. Marina Goodell, we could not complete her [facility] paperwork that enables her to get Dexilant more affordably until she was seen for an office visit.  We scheduled an appointment while on the phone.  Patient acknowledged and agreed

## 2012-03-08 NOTE — Assessment & Plan Note (Signed)
Discussed.

## 2012-03-08 NOTE — Assessment & Plan Note (Signed)
Continue with current prescription therapy as reflected on the Med list.  

## 2012-03-09 ENCOUNTER — Other Ambulatory Visit: Payer: Self-pay | Admitting: *Deleted

## 2012-03-09 ENCOUNTER — Ambulatory Visit: Payer: Medicare Other | Admitting: Physical Therapy

## 2012-03-09 MED ORDER — CYCLOBENZAPRINE HCL 5 MG PO TABS: 5.0000 mg | ORAL_TABLET | Freq: Two times a day (BID) | ORAL | Status: DC | PRN

## 2012-03-09 NOTE — Telephone Encounter (Signed)
Pt states rx was prescribed for muscle relaxer at OV yesterday, but pharmacy hasn't received it. Rx resent to [facility].

## 2012-03-14 ENCOUNTER — Encounter: Payer: Medicare Other | Admitting: Physical Therapy

## 2012-03-15 ENCOUNTER — Ambulatory Visit: Payer: Medicare Other | Admitting: Physical Therapy

## 2012-03-16 ENCOUNTER — Encounter: Payer: Medicare Other | Admitting: Physical Therapy

## 2012-03-17 ENCOUNTER — Ambulatory Visit (INDEPENDENT_AMBULATORY_CARE_PROVIDER_SITE_OTHER): Payer: Medicare Other | Admitting: Internal Medicine

## 2012-03-17 ENCOUNTER — Ambulatory Visit: Payer: Medicare Other | Admitting: Physical Therapy

## 2012-03-17 ENCOUNTER — Encounter: Payer: Self-pay | Admitting: Internal Medicine

## 2012-03-17 VITALS — BP 110/86 | HR 88 | Ht 61.5 in | Wt 200.0 lb

## 2012-03-17 DIAGNOSIS — K219 Gastro-esophageal reflux disease without esophagitis: Secondary | ICD-10-CM

## 2012-03-17 DIAGNOSIS — K222 Esophageal obstruction: Secondary | ICD-10-CM

## 2012-03-17 DIAGNOSIS — K5730 Diverticulosis of large intestine without perforation or abscess without bleeding: Secondary | ICD-10-CM

## 2012-03-17 NOTE — Patient Instructions (Addendum)
We have given you some gerd and reflux precautions to review, as well as some samples of Dexilant.  I will fill out your [facility] paperwork and return it as soon as possible

## 2012-03-17 NOTE — Progress Notes (Signed)
HISTORY OF PRESENT ILLNESS:  Mallory Klein is a 67 y.o. female with a history of asthma, hypertension, obesity, and GERD. She presents today regarding GERD and requests paperwork to be filled out so that she can obtain her PPI at a discounted rate. She underwent screening colonoscopy in November 2006. Examination revealed diverticulosis and internal hemorrhoids. Because the preparation was fair/adequate, followup in 7 years recommended. She did knowledge to me receiving her recall letter. Next, she was last seen in this office in June 2012 with complaints of nausea, intermittent vomiting, and dyspeptic complaints. At that time she was on NSAIDs and omeprazole. Blood work was unremarkable. Phenergan provided for nausea. Upper endoscopy was performed 08/18/2010. She was found to have a distal esophageal stricture and moderate hiatal hernia. She was continued on omeprazole for GERD. She contacted the office in July complaining of more classic reflux-type complaints. She was changed 60 mg daily. Shortly thereafter she had complete resolution of her symptoms. She is quite pleased. She has been receiving the medication through a discounted program. This requires paperwork to be filled out by the prescribing physician. She denies problems with dysphagia. She will have occasional breakthrough and/or regurgitation with dietary indiscretion. Her GI review of systems is otherwise negative.  REVIEW OF SYSTEMS:  All non-GI ROS negative except for arthritis and muscle cramps  Past Medical History  Diagnosis Date  . Asthma   . Hypertension   . Anxiety   . Depression   . GERD (gastroesophageal reflux disease)   . Osteoarthritis   . Diverticulosis of colon (without mention of hemorrhage)   . Internal hemorrhoids without mention of complication   . Esophageal stricture   . Hiatal hernia     Past Surgical History  Procedure Date  . Abdominal hysterectomy   . Splenectomy     Social History Mallory Klein  reports that she has never smoked. She has never used smokeless tobacco. She reports that she drinks about 1.2 ounces of alcohol per week. She reports that she does not use illicit drugs.  family history includes Asthma in her mother; Diabetes in her mother; Diabetes type II in her mother; Hypertension in her mother; Mental illness in her father; and Prostate cancer in her father.  There is no history of Colon cancer.  Allergies  Allergen Reactions  . Ace Inhibitors   . Aspirin     bruising  . Citalopram Hydrobromide     REACTION: diarrhea       PHYSICAL EXAMINATION: Vital signs: BP 110/86  Pulse 88  Ht 5' 1.5" (1.562 m)  Wt 200 lb (90.719 kg)  BMI 37.18 kg/m2 General: Well-developed, well-nourished, no acute distress HEENT: Sclerae are anicteric, conjunctiva pink. Oral mucosa intact Lungs: Clear Heart: Regular Abdomen: soft, nontender, nondistended, no obvious ascites, no peritoneal signs, normal bowel sounds. No organomegaly. Extremities: No edema Psychiatric: alert and oriented x3. Cooperative      ASSESSMENT:  #1. GERD. Controls symptoms. #2. Asymptomatic esophageal stricture #3. Colonoscopy November 2006 with diverticulosis and hemorrhoids. Suboptimal prep. Due for followup. Patient aware   PLAN:  #1. Reflux precautions #2. Continue PPI #3. Patient's paperwork filled out #4. Colonoscopy this year. Patient has agreed contact the office to set upper examination #5. Routine followup regarding her GERD in 2 years. Sooner if needed #6. Resume general medical care with Dr. Posey Rea

## 2012-03-21 ENCOUNTER — Ambulatory Visit: Payer: Medicare Other | Admitting: Physical Therapy

## 2012-03-22 ENCOUNTER — Encounter (HOSPITAL_BASED_OUTPATIENT_CLINIC_OR_DEPARTMENT_OTHER): Payer: Medicare Other

## 2012-03-22 ENCOUNTER — Other Ambulatory Visit: Payer: Self-pay

## 2012-03-22 MED ORDER — DEXLANSOPRAZOLE 60 MG PO CPDR: 60.0000 mg | DELAYED_RELEASE_CAPSULE | Freq: Every day | ORAL | Status: DC

## 2012-03-23 ENCOUNTER — Encounter (HOSPITAL_BASED_OUTPATIENT_CLINIC_OR_DEPARTMENT_OTHER): Payer: Medicare Other | Attending: General Surgery

## 2012-03-23 DIAGNOSIS — L8990 Pressure ulcer of unspecified site, unspecified stage: Secondary | ICD-10-CM | POA: Insufficient documentation

## 2012-03-23 DIAGNOSIS — L89609 Pressure ulcer of unspecified heel, unspecified stage: Secondary | ICD-10-CM | POA: Insufficient documentation

## 2012-03-23 DIAGNOSIS — Z96659 Presence of unspecified artificial knee joint: Secondary | ICD-10-CM | POA: Insufficient documentation

## 2012-03-24 ENCOUNTER — Telehealth: Payer: Self-pay

## 2012-03-24 ENCOUNTER — Encounter: Payer: Medicare Other | Admitting: Physical Therapy

## 2012-03-24 NOTE — Telephone Encounter (Signed)
Let patient know that I put the [facility] paperwork in the mail today.  I encouraged her to call me if there were any problems.  Patient agreed

## 2012-03-26 NOTE — Progress Notes (Cosign Needed)
[facility]  NAME:  Mallory Klein, Mallory Klein NO.:  [id number]  MEDICAL RECORD NO.:  [id number]      DATE OF BIRTH:  Jul 03, 1945  PHYSICIAN:  Ardath Sax, M.D.           VISIT DATE:                                  OFFICE VISIT   She is a 67 year old, African-American female who comes here with what looks to me to be a pressure ulcer on the back of her right heel.  She apparently had a total knee replacement done and because she could not straighten her knee, the doctor put her in a cast which promptly gave her a pressure ulcer on the back of her right heel.  It is fairly clean. I debrided it today and we are going to treat it with Santyl for a week and then plan on probably putting a Dermagraft.  This is only about 2 cm long and half a cm wide.  She has a history of hypertension and she is on verapamil and she also takes Xanax p.r.n., Flexeril, Foltrin, Advair inhalers, and she is on Paxil.  She is moderately obese, but is very alert and strong and moves her extremity fairly well, although she says that her knee has continued to hurt despite the surgery.  So her diagnosis is a pressure ulcer to right heel from a cast after a total knee replacement.  Other diagnoses are hypertension and moderate obesity.     Ardath Sax, M.D.     PP/MEDQ  D:  03/25/2012  T:  03/26/2012  Job:  [id number]

## 2012-04-01 ENCOUNTER — Encounter (HOSPITAL_BASED_OUTPATIENT_CLINIC_OR_DEPARTMENT_OTHER): Payer: Medicare Other | Attending: General Surgery

## 2012-04-01 DIAGNOSIS — L89609 Pressure ulcer of unspecified heel, unspecified stage: Secondary | ICD-10-CM | POA: Insufficient documentation

## 2012-04-01 DIAGNOSIS — L8993 Pressure ulcer of unspecified site, stage 3: Secondary | ICD-10-CM | POA: Insufficient documentation

## 2012-04-22 ENCOUNTER — Encounter: Payer: Self-pay | Admitting: Internal Medicine

## 2012-04-22 ENCOUNTER — Ambulatory Visit (INDEPENDENT_AMBULATORY_CARE_PROVIDER_SITE_OTHER): Payer: Medicare Other | Admitting: Internal Medicine

## 2012-04-22 VITALS — BP 128/90 | HR 76 | Temp 98.4°F | Resp 16 | Wt 203.0 lb

## 2012-04-22 DIAGNOSIS — R739 Hyperglycemia, unspecified: Secondary | ICD-10-CM

## 2012-04-22 DIAGNOSIS — M25561 Pain in right knee: Secondary | ICD-10-CM

## 2012-04-22 DIAGNOSIS — J45909 Unspecified asthma, uncomplicated: Secondary | ICD-10-CM

## 2012-04-22 DIAGNOSIS — M25569 Pain in unspecified knee: Secondary | ICD-10-CM

## 2012-04-22 DIAGNOSIS — R7309 Other abnormal glucose: Secondary | ICD-10-CM

## 2012-04-22 DIAGNOSIS — F329 Major depressive disorder, single episode, unspecified: Secondary | ICD-10-CM

## 2012-04-22 MED ORDER — VERAPAMIL HCL 180 MG (CO) PO TB24: 180.0000 mg | ORAL_TABLET | Freq: Every day | ORAL | Status: DC

## 2012-04-22 MED ORDER — OXYCODONE HCL 10 MG PO TABS: 5.0000 mg | ORAL_TABLET | Freq: Two times a day (BID) | ORAL | Status: DC | PRN

## 2012-04-22 MED ORDER — CYCLOBENZAPRINE HCL 10 MG PO TABS: 10.0000 mg | ORAL_TABLET | Freq: Three times a day (TID) | ORAL | Status: DC | PRN

## 2012-04-22 NOTE — Assessment & Plan Note (Signed)
In PT Oxycodone prn

## 2012-04-22 NOTE — Assessment & Plan Note (Signed)
Continue with current prescription therapy as reflected on the Med list.  

## 2012-04-22 NOTE — Assessment & Plan Note (Signed)
nutr consult

## 2012-04-22 NOTE — Progress Notes (Signed)
Subjective:    HPI F/u neck pain  F/u on L elbow rash, hot flashes and urinary incontinence Her son died last year - 67 yo  Mallory Klein needs to address  chronic hypertension that has been well controlled with medicines; to address chronic  hyperlipidemia controlled with medicines as well; and to address type 2 chronic diabetes, controlled with medical treatment and diet.  R TKR Dr Josefine Class [place]. She had a manipulation in August, Sept, Nov - it did not work... In PT.  F/u a blister on R foot from Mallory cast  BP Readings from Last 3 Encounters:  04/22/12 128/90  03/17/12 110/86  03/08/12 130/90   Wt Readings from Last 3 Encounters:  04/22/12 203 lb (92.08 kg)  03/17/12 200 lb (90.719 kg)  03/08/12 199 lb (90.266 kg)      Review of Systems  Constitutional: Negative.  Negative for chills, diaphoresis, activity change, appetite change, fatigue and unexpected weight change.  HENT: Negative for hearing loss, ear pain, nosebleeds, congestion, sore throat, facial swelling, rhinorrhea, sneezing, mouth sores, neck stiffness, postnasal drip, sinus pressure and tinnitus.   Eyes: Negative for pain, discharge, redness, itching and visual disturbance.  Respiratory: Negative for cough, chest tightness, shortness of breath, wheezing and stridor.   Cardiovascular: Negative for palpitations and leg swelling.  Gastrointestinal: Negative for nausea, diarrhea, constipation, blood in stool, abdominal distention, anal bleeding and rectal pain.  Genitourinary: Negative for dysuria, urgency, frequency, hematuria, flank pain, vaginal bleeding, vaginal discharge, difficulty urinating, genital sores and pelvic pain.  Musculoskeletal: Positive for arthralgias and gait problem. Negative for back pain and joint swelling.       R knee pain  Skin: Negative.  Negative for rash.  Neurological: Negative for dizziness, tremors, seizures, syncope and speech difficulty.  Hematological: Negative for  adenopathy. Does not bruise/bleed easily.  Psychiatric/Behavioral: Positive for sleep disturbance. Negative for suicidal ideas, behavioral problems, dysphoric mood and decreased concentration. Mallory Klein is not nervous/anxious.        Objective:   Physical Exam  Constitutional: She appears well-developed. No distress.  Obese  HENT:  Head: Normocephalic.  Right Ear: External ear normal.  Left Ear: External ear normal.  Nose: Nose normal.  Mouth/Throat: Oropharynx is clear and moist.  Eyes: Conjunctivae are normal. Pupils are equal, round, and reactive to light. Right eye exhibits no discharge. Left eye exhibits no discharge.  Neck: Normal range of motion. Neck supple. No JVD present. No tracheal deviation present. No thyromegaly present.  Cardiovascular: Normal rate, regular rhythm and normal heart sounds.  Exam reveals no gallop and no friction rub.   No murmur heard. Pulmonary/Chest: No stridor. No respiratory distress. She has no wheezes.  Abdominal: Soft. Bowel sounds are normal. She exhibits no distension and no mass. There is no tenderness. There is no rebound and no guarding.  Musculoskeletal: She exhibits tenderness (R knee is stiff). She exhibits no edema.  Neck is less tender  Lymphadenopathy:    She has no cervical adenopathy.  Neurological: She displays normal reflexes. No cranial nerve deficit. She exhibits normal muscle tone. Coordination normal.  Skin: No rash noted. No erythema. No pallor.  Psychiatric: She has a normal mood and affect. Her behavior is normal. Judgment and thought content normal.  L shoulder is  Better  L shoulder is 1 inch higher Neck ROM is ok R heel ulcer - healing  Lab Results  Component Value Date   WBC 8.4 03/04/2011   HGB 13.1 03/04/2011  HCT 41.2 03/04/2011   PLT 353.0 03/04/2011   GLUCOSE 111* 03/04/2011   CHOL 220* 02/10/2010   TRIG 88.0 02/10/2010   HDL 85.10 02/10/2010   LDLDIRECT 109.8 02/10/2010   LDLCALC 111* 04/06/2006   ALT 18  03/04/2011   AST 19 03/04/2011   NA 142 03/04/2011   K 4.3 03/04/2011   CL 105 03/04/2011   CREATININE 0.9 03/04/2011   BUN 16 03/04/2011   CO2 32 03/04/2011   TSH 1.46 03/04/2011   HGBA1C 5.9 03/04/2011          Assessment & Plan:

## 2012-04-29 ENCOUNTER — Ambulatory Visit: Payer: Medicare Other | Admitting: Dietician

## 2012-04-29 ENCOUNTER — Encounter (HOSPITAL_BASED_OUTPATIENT_CLINIC_OR_DEPARTMENT_OTHER): Payer: Medicare Other | Attending: General Surgery

## 2012-04-29 DIAGNOSIS — L89609 Pressure ulcer of unspecified heel, unspecified stage: Secondary | ICD-10-CM | POA: Insufficient documentation

## 2012-04-29 DIAGNOSIS — L8993 Pressure ulcer of unspecified site, stage 3: Secondary | ICD-10-CM | POA: Insufficient documentation

## 2012-05-02 ENCOUNTER — Encounter: Payer: Medicare Other | Attending: Internal Medicine | Admitting: Dietician

## 2012-05-02 ENCOUNTER — Encounter: Payer: Self-pay | Admitting: Dietician

## 2012-05-02 VITALS — Ht 61.0 in | Wt 202.3 lb

## 2012-05-02 DIAGNOSIS — Z713 Dietary counseling and surveillance: Secondary | ICD-10-CM | POA: Insufficient documentation

## 2012-05-02 DIAGNOSIS — R739 Hyperglycemia, unspecified: Secondary | ICD-10-CM

## 2012-05-02 NOTE — Progress Notes (Signed)
Medical Nutrition Therapy:  Appt start time: 1530 end time:  1630.  Assessment:  Primary concerns today: wt control, pre-DM.   MEDICATIONS: see list   Labs- HgbA1c 5.9, TC 220  DIETARY INTAKE:  Usual eating pattern includes 2 meals and 2 snacks per day.  Everyday foods include juice, popcorn.  Avoided foods include none.    24-hr recall:  B ( AM): coffee and muffin or bagel or oatmeal or donut. Half glass of juice  Snk ( AM): fruit  L ( PM): none Snk ( PM): none D ( PM): chinese food, meatloaf with veg, pork chops or baked chix with gravy, fried fish with home fries and veg.  Snk ( PM): microwave popcorn 5 nights a week, margarita (1) Beverages: juice, Fuse iced tea, ginger ale, pt states distaste for plain water  Pt reports lots of grilling during summer. Often this includes baked potato with lots of fixings, and steak with salad. Salad usually dressed lightly with vinegrette or ranch.   Usual physical activity: very limited due to pain in back and knee (knee replacement last year).  Progress Towards Goal(s):  In progress.   Nutritional Diagnosis:  NI-1.5 Excessive energy intake As related to low PA due to knee replacement and back pain, high kcal food choices.  As evidenced by continual wt gain since knee replacement, pt recall of diet and activity.    Intervention:  Nutrition education provided regarding pre-DM, nature of DM type II, and halt/reversal of disease progression via diet, activity, and wt loss. Carb counting was discussed at length with pt and husband, as well as generally tenets of portion control, intake of high kcal foods. Concept of diet as a lifestyle was discussed and how to examine appropriate choices for foods and meal planning. The Plate Method was taught by RD, and the concept of kcal density was emphasized to highlight how to choose foods to feel full, but still eat foods that are enjoyable.   Distribution of carb choices throughout the day was discussed  and RD assigned a plan of 3-4 breakfast choices, 0-1 morning snack choices, 1 lunch choice, 3 dinner choices, and 2 HS snack choices to suit pt's preferred eating style.   Pt asked for options for no-calorie drinks, and flavoring water with fruit, crystal light, and splenda-sweetened tea were all discussed as reasonable options.  As far as PA, pt is limited, but claims to enjoy water-based activities. She states she will initiate upon warmer climate. In the meantime, the RD emphasized a minimum of 30 minutes each day, preferably at least 10 minutes at a time of walking, cycling, or arm-based chair movements.   Handouts given during visit include:  Diabetes booklet  Plate Method  Monitoring/Evaluation:  Dietary intake, exercise, portion control, and body weight in 1 month(s).

## 2012-06-06 ENCOUNTER — Encounter: Payer: Medicare Other | Attending: Internal Medicine | Admitting: Dietician

## 2012-06-06 ENCOUNTER — Encounter: Payer: Self-pay | Admitting: Internal Medicine

## 2012-06-06 ENCOUNTER — Encounter: Payer: Self-pay | Admitting: Dietician

## 2012-06-06 DIAGNOSIS — Z713 Dietary counseling and surveillance: Secondary | ICD-10-CM | POA: Insufficient documentation

## 2012-06-06 DIAGNOSIS — R739 Hyperglycemia, unspecified: Secondary | ICD-10-CM

## 2012-06-06 NOTE — Progress Notes (Signed)
A: Pt reports today for F/U on wt loss. She checks in at 196.4 lbs, down from 202.4 lbs. On 05/02/12.  Pt has been grilling lots of vegetables, and following carb choice assignment appropriately. She has been choosing lower fat options wherever appropriate. She has also switched to noncaloric beverages by adding crystal light to water.   Limited PA due to knee problems, but goes to PT twice per week and does some cycling. Will begin water aerobics in near future. She has purchased an exercise bike but does not yet use it often.   Pt has some questions about portion sizes, low kcal snack choices, dessert items, and best protein choices to reduce cholesterol.   I: RD answered pt questions, demonstrated appropriate portion sizes. RD instructed pt to use exercise bike 15 minutes per day when not in PT. RD also provided some cholesterol and triglycerides handouts for pt husband, whom the pt claims has high cholesterol. Follow similar CHO choice pattern. Continue positive changes to diet, such as noncaloric beverages.  M/E: Pt has appointment with PCP on May 27. F/U within 1 week of then to check any alterations to meds, any updated bloodwork. Monitor wt, diet adherence, PA.

## 2012-06-16 ENCOUNTER — Encounter: Payer: Self-pay | Admitting: Internal Medicine

## 2012-06-27 ENCOUNTER — Other Ambulatory Visit: Payer: Self-pay | Admitting: Internal Medicine

## 2012-07-19 ENCOUNTER — Telehealth: Payer: Self-pay | Admitting: *Deleted

## 2012-07-19 ENCOUNTER — Ambulatory Visit (INDEPENDENT_AMBULATORY_CARE_PROVIDER_SITE_OTHER): Payer: Medicare Other | Admitting: Internal Medicine

## 2012-07-19 ENCOUNTER — Other Ambulatory Visit (INDEPENDENT_AMBULATORY_CARE_PROVIDER_SITE_OTHER): Payer: Medicare Other

## 2012-07-19 ENCOUNTER — Encounter: Payer: Self-pay | Admitting: Internal Medicine

## 2012-07-19 VITALS — BP 128/80 | HR 80 | Temp 98.0°F | Resp 16 | Wt 198.0 lb

## 2012-07-19 DIAGNOSIS — J45909 Unspecified asthma, uncomplicated: Secondary | ICD-10-CM

## 2012-07-19 DIAGNOSIS — R7309 Other abnormal glucose: Secondary | ICD-10-CM

## 2012-07-19 DIAGNOSIS — Z23 Encounter for immunization: Secondary | ICD-10-CM

## 2012-07-19 DIAGNOSIS — I10 Essential (primary) hypertension: Secondary | ICD-10-CM

## 2012-07-19 DIAGNOSIS — R739 Hyperglycemia, unspecified: Secondary | ICD-10-CM

## 2012-07-19 DIAGNOSIS — F329 Major depressive disorder, single episode, unspecified: Secondary | ICD-10-CM

## 2012-07-19 LAB — BASIC METABOLIC PANEL
CO2: 30 mEq/L (ref 19–32)
Calcium: 9.1 mg/dL (ref 8.4–10.5)
Creatinine, Ser: 1 mg/dL (ref 0.4–1.2)
GFR: 72.89 mL/min (ref >60.00)

## 2012-07-19 NOTE — Addendum Note (Signed)
Addended by: Merrilyn Puma on: 07/19/2012 02:40 PM   Modules accepted: Orders

## 2012-07-19 NOTE — Assessment & Plan Note (Signed)
Continue with current prescription therapy as reflected on the Med list.  

## 2012-07-19 NOTE — Progress Notes (Signed)
Subjective:    HPI F/u neck pain  F/u on hot flashes and urinary incontinence Her son died last year - 67 yo  The patient needs to address  chronic hypertension that has been well controlled with medicines; to address chronic  hyperlipidemia controlled with medicines as well; and to address type 2 chronic diabetes, controlled with medical treatment and diet.  R TKR Dr Josefine Class [place]. She had a manipulation x2 - it did not work... In group PT.   BP Readings from Last 3 Encounters:  07/19/12 128/80  04/22/12 128/90  03/17/12 110/86   Wt Readings from Last 3 Encounters:  07/19/12 198 lb (89.812 kg)  05/02/12 202 lb 4.8 oz (91.763 kg)  04/22/12 203 lb (92.08 kg)      Review of Systems  Constitutional: Negative.  Negative for chills, diaphoresis, activity change, appetite change, fatigue and unexpected weight change.  HENT: Negative for hearing loss, ear pain, nosebleeds, congestion, sore throat, facial swelling, rhinorrhea, sneezing, mouth sores, neck stiffness, postnasal drip, sinus pressure and tinnitus.   Eyes: Negative for pain, discharge, redness, itching and visual disturbance.  Respiratory: Negative for cough, chest tightness, shortness of breath, wheezing and stridor.   Cardiovascular: Negative for palpitations and leg swelling.  Gastrointestinal: Negative for nausea, diarrhea, constipation, blood in stool, abdominal distention, anal bleeding and rectal pain.  Genitourinary: Negative for dysuria, urgency, frequency, hematuria, flank pain, vaginal bleeding, vaginal discharge, difficulty urinating, genital sores and pelvic pain.  Musculoskeletal: Positive for arthralgias and gait problem. Negative for back pain and joint swelling.       R knee pain  Skin: Negative.  Negative for rash.  Neurological: Negative for dizziness, tremors, seizures, syncope and speech difficulty.  Hematological: Negative for adenopathy. Does not bruise/bleed easily.   Psychiatric/Behavioral: Positive for sleep disturbance. Negative for suicidal ideas, behavioral problems, dysphoric mood and decreased concentration. The patient is not nervous/anxious.        Objective:   Physical Exam  Constitutional: She appears well-developed. No distress.  Obese  HENT:  Head: Normocephalic.  Right Ear: External ear normal.  Left Ear: External ear normal.  Nose: Nose normal.  Mouth/Throat: Oropharynx is clear and moist.  Eyes: Conjunctivae are normal. Pupils are equal, round, and reactive to light. Right eye exhibits no discharge. Left eye exhibits no discharge.  Neck: Normal range of motion. Neck supple. No JVD present. No tracheal deviation present. No thyromegaly present.  Cardiovascular: Normal rate, regular rhythm and normal heart sounds.  Exam reveals no gallop and no friction rub.   No murmur heard. Pulmonary/Chest: No stridor. No respiratory distress. She has no wheezes.  Abdominal: Soft. Bowel sounds are normal. She exhibits no distension and no mass. There is no tenderness. There is no rebound and no guarding.  Musculoskeletal: She exhibits tenderness (R knee is stiff). She exhibits no edema.  Neck is less tender  Lymphadenopathy:    She has no cervical adenopathy.  Neurological: She displays normal reflexes. No cranial nerve deficit. She exhibits normal muscle tone. Coordination normal.  Skin: No rash noted. No erythema. No pallor.  Psychiatric: She has a normal mood and affect. Her behavior is normal. Judgment and thought content normal.   L shoulder is 1 inch higher Neck ROM is ok   Lab Results  Component Value Date   WBC 8.4 03/04/2011   HGB 13.1 03/04/2011   HCT 41.2 03/04/2011   PLT 353.0 03/04/2011   GLUCOSE 111* 03/04/2011   CHOL 220* 02/10/2010  TRIG 88.0 02/10/2010   HDL 85.10 02/10/2010   LDLDIRECT 109.8 02/10/2010   LDLCALC 111* 04/06/2006   ALT 18 03/04/2011   AST 19 03/04/2011   NA 142 03/04/2011   K 4.3 03/04/2011   CL 105 03/04/2011    CREATININE 0.9 03/04/2011   BUN 16 03/04/2011   CO2 32 03/04/2011   TSH 1.46 03/04/2011   HGBA1C 5.9 03/04/2011          Assessment & Plan:

## 2012-07-19 NOTE — Telephone Encounter (Signed)
Pt left message on VM last Friday asking if she needed lab work before appointment today.

## 2012-07-19 NOTE — Assessment & Plan Note (Signed)
Labs

## 2012-07-19 NOTE — Telephone Encounter (Signed)
BMET, A1c Thx 

## 2012-07-19 NOTE — Assessment & Plan Note (Signed)
Better  Wt Readings from Last 3 Encounters:  07/19/12 198 lb (89.812 kg)  05/02/12 202 lb 4.8 oz (91.763 kg)  04/22/12 203 lb (92.08 kg)

## 2012-07-19 NOTE — Telephone Encounter (Signed)
Orders placed into Epic.

## 2012-07-22 ENCOUNTER — Encounter: Payer: Self-pay | Admitting: Dietician

## 2012-07-22 ENCOUNTER — Encounter: Payer: Medicare Other | Attending: Internal Medicine | Admitting: Dietician

## 2012-07-22 VITALS — Wt 196.9 lb

## 2012-07-22 DIAGNOSIS — R739 Hyperglycemia, unspecified: Secondary | ICD-10-CM

## 2012-07-22 DIAGNOSIS — Z713 Dietary counseling and surveillance: Secondary | ICD-10-CM | POA: Insufficient documentation

## 2012-07-22 NOTE — Progress Notes (Signed)
A: Pt claims she has had trouble with breakfast. She has only been eating about 1 piece of fruit or small bowl of oatmeal.   She has improved in portion control for other aspects of her diet. She eats only 1/2 to 1 cup of dessert when she chooses it, and has increased her vegetable intake.   PA is still very limited, and likely explains the continued elevation of her HgbA1c (6.2 on May 27th) and lack of wt loss since last encounter.   I: RD counseled pt on importance of incorporating a protein food at every breakfast to improve energy expenditure and satiety throughout the day. Pt claims to enjoy [place] yogurt, so RD recommended starting each day with a cup for easing into eating breakfast daily.  RD also emphasized the importance of a minimum PA of 150 minutes per week to improve insulin sensitivity. Based on recent research showing benefits of short bouts of PA directly before eating, RD recommended 10 minutes on bike directly before each of 3 meals each day the pt is not in PT.   M/E: Monitor diet adherence, wt, body comp, pertinent labs. F/U in 2 months.

## 2012-09-09 ENCOUNTER — Other Ambulatory Visit: Payer: Self-pay | Admitting: Internal Medicine

## 2012-09-22 ENCOUNTER — Encounter: Payer: Self-pay | Admitting: Internal Medicine

## 2012-09-23 ENCOUNTER — Ambulatory Visit: Payer: Medicare Other | Admitting: Dietician

## 2012-09-29 ENCOUNTER — Ambulatory Visit (AMBULATORY_SURGERY_CENTER): Payer: Medicare Other

## 2012-09-29 ENCOUNTER — Encounter: Payer: Self-pay | Admitting: Internal Medicine

## 2012-09-29 VITALS — Ht 61.5 in | Wt 190.0 lb

## 2012-09-29 DIAGNOSIS — Z1211 Encounter for screening for malignant neoplasm of colon: Secondary | ICD-10-CM

## 2012-09-29 MED ORDER — MOVIPREP 100 G PO SOLR: 1.0000 | Freq: Once | ORAL | Status: DC

## 2012-10-06 ENCOUNTER — Encounter: Payer: Self-pay | Admitting: Internal Medicine

## 2012-10-11 ENCOUNTER — Encounter: Payer: Self-pay | Admitting: Internal Medicine

## 2012-10-11 ENCOUNTER — Ambulatory Visit (AMBULATORY_SURGERY_CENTER): Payer: Medicare Other | Admitting: Internal Medicine

## 2012-10-11 VITALS — BP 132/104 | HR 75 | Temp 97.6°F | Resp 22 | Ht 61.5 in | Wt 190.0 lb

## 2012-10-11 DIAGNOSIS — Z1211 Encounter for screening for malignant neoplasm of colon: Secondary | ICD-10-CM

## 2012-10-11 MED ORDER — SODIUM CHLORIDE 0.9 % IV SOLN: 500.0000 mL | INTRAVENOUS | Status: DC

## 2012-10-11 NOTE — Op Note (Signed)
[facility] [address] [place], [address]   COLONOSCOPY PROCEDURE REPORT  PATIENT: Mallory Klein, Mallory Klein  MR#: [id number] BIRTHDATE: 1945/05/16 , 66  yrs. old GENDER: Female ENDOSCOPIST: Roxy Cedar, MD REFERRED [id number] Recall, PROCEDURE DATE:  10/11/2012 PROCEDURE:   Colonoscopy, screening First Screening Colonoscopy - Avg.  risk and is 50 yrs.  old or older - No.  Prior Negative Screening - Now for repeat screening. Inadequate prep  History of Adenoma - Now for follow-up colonoscopy & has been > or = to 3 yrs.  N/A  Polyps Removed Today? No. Recommend repeat exam, <10 yrs? No. ASA CLASS:   Class II INDICATIONS:average risk screening. Index exam November 2006. Negative. Back sooner because of fair prep. MEDICATIONS: MAC sedation, administered by CRNA and propofol (Diprivan) 300mg  IV  DESCRIPTION OF PROCEDURE:   After the risks benefits and alternatives of the procedure were thoroughly explained, informed consent was obtained.  A digital rectal exam revealed no abnormalities of the rectum.   The LB [id number]  endoscope was introduced through the anus and advanced to the cecum, which was identified by both the appendix and ileocecal valve. No adverse events experienced.   The quality of the prep was excellent, using MoviPrep  The instrument was then slowly withdrawn as the colon was fully examined.    COLON FINDINGS: Mild diverticulosis was noted throughout the entire examined colon.   The colon was otherwise normal.  There was no inflammation, polyps or cancers .  Retroflexed views revealed internal hemorrhoids. The time to cecum=3 minutes 01 seconds. Withdrawal time=8 minutes 0 seconds.  The scope was withdrawn and the procedure completed.  COMPLICATIONS: There were no complications.  ENDOSCOPIC IMPRESSION: 1.   Mild diverticulosis was noted throughout the entire examined colon 2.   The colon was otherwise  normal  RECOMMENDATIONS: 1. Continue current colorectal screening recommendations for "routine risk" patients with a repeat colonoscopy in 10 years.   eSigned:  Roxy Cedar, MD 10/11/2012 3:21 PM   cc: Linda Hedges.  Plotnikov, MD and The Patient   PATIENT NAME:  Mallory Klein, Mallory Klein MR#: [id number]

## 2012-10-11 NOTE — Progress Notes (Signed)
Patient did not have preoperative order for IV antibiotic SSI prophylaxis. (G8918)  Patient did not experience any of the following events: a burn prior to discharge; a fall within the facility; wrong site/side/patient/procedure/implant event; or a hospital transfer or hospital admission upon discharge from the facility. (G8907)  

## 2012-10-11 NOTE — Patient Instructions (Addendum)
YOU HAD AN ENDOSCOPIC PROCEDURE TODAY AT THE [facility]: Refer to the procedure report that was given to you for any specific questions about what was found during the examination.  If the procedure report does not answer your questions, please call your gastroenterologist to clarify.  If you requested that your care partner not be given the details of your procedure findings, then the procedure report has been included in a sealed envelope for you to review at your convenience later.  YOU SHOULD EXPECT: Some feelings of bloating in the abdomen. Passage of more gas than usual.  Walking can help get rid of the air that was put into your GI tract during the procedure and reduce the bloating. If you had a lower endoscopy (such as a colonoscopy or flexible sigmoidoscopy) you may notice spotting of blood in your stool or on the toilet paper. If you underwent a bowel prep for your procedure, then you may not have a normal bowel movement for a few days.  DIET: Your first meal following the procedure should be a light meal and then it is ok to progress to your normal diet.  A half-sandwich or bowl of soup is an example of a good first meal.  Heavy or fried foods are harder to digest and may make you feel nauseous or bloated.  Likewise meals heavy in dairy and vegetables can cause extra gas to form and this can also increase the bloating.  Drink plenty of fluids but you should avoid alcoholic beverages for 24 hours.  ACTIVITY: Your care partner should take you home directly after the procedure.  You should plan to take it easy, moving slowly for the rest of the day.  You can resume normal activity the day after the procedure however you should NOT DRIVE or use heavy machinery for 24 hours (because of the sedation medicines used during the test).    SYMPTOMS TO REPORT IMMEDIATELY: A gastroenterologist can be reached at any hour.  During normal business hours, 8:30 AM to 5:00 PM Monday through Friday,  call [phone].  After hours and on weekends, please call the GI answering service at [phone] who will take a message and have the physician on call contact you.   Following lower endoscopy (colonoscopy or flexible sigmoidoscopy):  Excessive amounts of blood in the stool  Significant tenderness or worsening of abdominal pains  Swelling of the abdomen that is new, acute  Fever of 100F or higher  FOLLOW UP: If any biopsies were taken you will be contacted by phone or by letter within the next 1-3 weeks.  Call your gastroenterologist if you have not heard about the biopsies in 3 weeks.  Our staff will call the home number listed on your records the next business day following your procedure to check on you and address any questions or concerns that you may have at that time regarding the information given to you following your procedure. This is a courtesy call and so if there is no answer at the home number and we have not heard from you through the emergency physician on call, we will assume that you have returned to your regular daily activities without incident.  SIGNATURES/CONFIDENTIALITY: You and/or your care partner have signed paperwork which will be entered into your electronic medical record.  These signatures attest to the fact that that the information above on your After Visit Summary has been reviewed and is understood.  Full responsibility of the confidentiality of this   discharge information lies with you and/or your care-partner.  Recommendations Continue current colorectal screening recommendations for "routine risk" patients with a repeat colonoscopy in 10 years.  

## 2012-10-12 ENCOUNTER — Telehealth: Payer: Self-pay | Admitting: *Deleted

## 2012-10-12 NOTE — Telephone Encounter (Signed)
  Follow up Call-  Call back number 10/11/2012 08/18/2010  Post procedure Call Back phone  # [phone]  Permission to leave phone message Yes -     Patient questions:  Do you have a fever, pain , or abdominal swelling? no Pain Score  0 *  Have you tolerated food without any problems? yes  Have you been able to return to your normal activities? yes  Do you have any questions about your discharge instructions: Diet   no Medications  no Follow up visit  no  Do you have questions or concerns about your Care? no  Actions: * If pain score is 4 or above: No action needed, pain <4.

## 2012-10-13 ENCOUNTER — Encounter: Payer: Medicare Other | Admitting: Internal Medicine

## 2012-10-21 ENCOUNTER — Ambulatory Visit (INDEPENDENT_AMBULATORY_CARE_PROVIDER_SITE_OTHER): Payer: Medicare Other | Admitting: Internal Medicine

## 2012-10-21 ENCOUNTER — Encounter: Payer: Self-pay | Admitting: Internal Medicine

## 2012-10-21 DIAGNOSIS — M25569 Pain in unspecified knee: Secondary | ICD-10-CM

## 2012-10-21 DIAGNOSIS — M25561 Pain in right knee: Secondary | ICD-10-CM

## 2012-10-21 DIAGNOSIS — M542 Cervicalgia: Secondary | ICD-10-CM

## 2012-10-21 DIAGNOSIS — F411 Generalized anxiety disorder: Secondary | ICD-10-CM

## 2012-10-21 DIAGNOSIS — J45909 Unspecified asthma, uncomplicated: Secondary | ICD-10-CM

## 2012-10-21 DIAGNOSIS — I10 Essential (primary) hypertension: Secondary | ICD-10-CM

## 2012-10-21 DIAGNOSIS — R7309 Other abnormal glucose: Secondary | ICD-10-CM

## 2012-10-21 DIAGNOSIS — F329 Major depressive disorder, single episode, unspecified: Secondary | ICD-10-CM

## 2012-10-21 DIAGNOSIS — Z23 Encounter for immunization: Secondary | ICD-10-CM

## 2012-10-21 DIAGNOSIS — R739 Hyperglycemia, unspecified: Secondary | ICD-10-CM

## 2012-10-21 MED ORDER — ESTRADIOL 0.05 MG/24HR TD PTTW
1.0000 | MEDICATED_PATCH | TRANSDERMAL | Status: DC
Start: 2012-10-21 — End: 2014-06-15

## 2012-10-21 MED ORDER — LORCASERIN HCL 10 MG PO TABS: 1.0000 | ORAL_TABLET | Freq: Two times a day (BID) | ORAL | Status: DC

## 2012-10-21 NOTE — Patient Instructions (Addendum)
[employer]  Gluten free trial (no wheat products) for 4-6 weeks. OK to use gluten-free bread and gluten-free pasta.  Milk free trial (no milk, ice cream, cheese and yogurt) for 4-6 weeks. OK to use almond, coconut, rice or soy milk.  [employer]

## 2012-10-21 NOTE — Progress Notes (Signed)
Subjective:    HPI  F/u neck pain - not better C/o bad hot flashes F/u on hot flashes and urinary incontinence Her dad is in a NH now  Her son died last year - 67 yo  The patient needs to address  chronic hypertension that has been well controlled with medicines; to address chronic  hyperlipidemia controlled with medicines as well; and to address type 2 chronic diabetes, controlled with medical treatment and diet.  R T[place] Dr Josefine Class [place] 2[place]. She had a manipulation x2 - it did not work... In group PT.   BP Readings from Last 3 Encounters:  10/21/12 120/82  10/11/12 132/104  07/19/12 128/80   Wt Readings from Last 3 Encounters:  10/21/12 198 lb (89.812 kg)  10/11/12 190 lb (86.183 kg)  09/29/12 190 lb (86.183 kg)      Review of Systems  Constitutional: Negative.  Negative for chills, diaphoresis, activity change, appetite change, fatigue and unexpected weight change.  HENT: Negative for hearing loss, ear pain, nosebleeds, congestion, sore throat, facial swelling, rhinorrhea, sneezing, mouth sores, neck stiffness, postnasal drip, sinus pressure and tinnitus.   Eyes: Negative for pain, discharge, redness, itching and visual disturbance.  Respiratory: Negative for cough, chest tightness, shortness of breath, wheezing and stridor.   Cardiovascular: Negative for palpitations and leg swelling.  Gastrointestinal: Negative for nausea, diarrhea, constipation, blood in stool, abdominal distention, anal bleeding and rectal pain.  Genitourinary: Negative for dysuria, urgency, frequency, hematuria, flank pain, vaginal bleeding, vaginal discharge, difficulty urinating, genital sores and pelvic pain.  Musculoskeletal: Positive for arthralgias and gait problem. Negative for back pain and joint swelling.       R knee pain  Skin: Negative.  Negative for rash.  Neurological: Negative for dizziness, tremors, seizures, syncope and speech difficulty.  Hematological: Negative for  adenopathy. Does not bruise/bleed easily.  Psychiatric/Behavioral: Positive for sleep disturbance. Negative for suicidal ideas, behavioral problems, dysphoric mood and decreased concentration. The patient is not nervous/anxious.        Objective:   Physical Exam  Constitutional: She appears well-developed. No distress.  Obese  HENT:  Head: Normocephalic.  Right Ear: External ear normal.  Left Ear: External ear normal.  Nose: Nose normal.  Mouth/Throat: Oropharynx is clear and moist.  Eyes: Conjunctivae are normal. Pupils are equal, round, and reactive to light. Right eye exhibits no discharge. Left eye exhibits no discharge.  Neck: Normal range of motion. Neck supple. No JVD present. No tracheal deviation present. No thyromegaly present.  Cardiovascular: Normal rate, regular rhythm and normal heart sounds.  Exam reveals no gallop and no friction rub.   No murmur heard. Pulmonary/Chest: No stridor. No respiratory distress. She has no wheezes.  Abdominal: Soft. Bowel sounds are normal. She exhibits no distension and no mass. There is no tenderness. There is no rebound and no guarding.  Musculoskeletal: She exhibits tenderness (R knee is stiff). She exhibits no edema.  Neck is less tender  Lymphadenopathy:    She has no cervical adenopathy.  Neurological: She displays normal reflexes. No cranial nerve deficit. She exhibits normal muscle tone. Coordination normal.  Skin: No rash noted. No erythema. No pallor.  Psychiatric: She has a normal mood and affect. Her behavior is normal. Judgment and thought content normal.   L shoulder is 1 inch higher Neck ROM is ok, tender   Lab Results  Component Value Date   WBC 8.4 03/04/2011   HGB 13.1 03/04/2011   HCT 41.2 03/04/2011  PLT 353.0 03/04/2011   GLUCOSE 71 07/19/2012   CHOL 220* 02/10/2010   TRIG 88.0 02/10/2010   HDL 85.10 02/10/2010   LDLDIRECT 109.8 02/10/2010   LDLCALC 111* 04/06/2006   ALT 18 03/04/2011   AST 19 03/04/2011   NA 141  07/19/2012   K 4.4 07/19/2012   CL 103 07/19/2012   CREATININE 1.0 07/19/2012   BUN 12 07/19/2012   CO2 30 07/19/2012   TSH 1.46 03/04/2011   HGBA1C 6.2 07/19/2012          Assessment & Plan:

## 2012-10-21 NOTE — Assessment & Plan Note (Signed)
In group PT

## 2012-10-21 NOTE — Assessment & Plan Note (Addendum)
Continue with current prescription therapy as reflected on the Med list.  Gluten free trial (no wheat products) for 4-6 weeks. OK to use gluten-free bread and gluten-free pasta.  Milk free trial (no milk, ice cream, cheese and yogurt) for 4-6 weeks. OK to use almond, coconut, rice or soy milk.  

## 2012-10-21 NOTE — Assessment & Plan Note (Signed)
Chronic. 

## 2012-10-21 NOTE — Assessment & Plan Note (Signed)
Continue with current prescription therapy as reflected on the Med list.  

## 2012-10-21 NOTE — Assessment & Plan Note (Signed)
Will try Belviq 

## 2012-10-21 NOTE — Addendum Note (Signed)
Addended by: Merrilyn Puma on: 10/21/2012 02:17 PM   Modules accepted: Orders

## 2012-10-27 ENCOUNTER — Telehealth: Payer: Self-pay | Admitting: *Deleted

## 2012-10-27 NOTE — Telephone Encounter (Signed)
Message copied by Merrilyn Puma on Thu Oct 27, 2012  2:15 PM ------      Message from: [place], [place]      Created: Thu Oct 27, 2012  2:03 PM      Regarding: Prescription       Hi Misty Stanley,            The patient left a voicemail on my ext. Stating that she was in to see Dr. Posey Rea Friday and mentioned that she needed a rx for Promethazine DM syrup for her asthma. She did not receive it at her pharmacy which is [facility]. She is asking that you check with Dr. Posey Rea and see if you can call this in for her. ------

## 2012-10-27 NOTE — Telephone Encounter (Signed)
Pt is req Rf on prometh DM cough syrup. Ok to Rf?

## 2012-10-27 NOTE — Telephone Encounter (Signed)
Ok to ref Thx 

## 2012-10-28 MED ORDER — PROMETHAZINE-CODEINE 6.25-10 MG/5ML PO SYRP: 5.0000 mL | ORAL_SOLUTION | Freq: Four times a day (QID) | ORAL | Status: DC | PRN

## 2012-10-28 NOTE — Telephone Encounter (Signed)
Done

## 2012-11-07 ENCOUNTER — Encounter: Payer: Self-pay | Admitting: Internal Medicine

## 2012-12-20 ENCOUNTER — Telehealth: Payer: Self-pay | Admitting: Internal Medicine

## 2012-12-20 NOTE — Telephone Encounter (Signed)
Pt wants to know if we got the P A from her insurance and when it will be faxed back.  Belviq?

## 2012-12-22 ENCOUNTER — Telehealth: Payer: Self-pay | Admitting: Internal Medicine

## 2012-12-22 NOTE — Telephone Encounter (Signed)
12/12/2012  Pt left message wanting to know if e received the fax from her insurance company.  Would like to know either way.  Thanks

## 2012-12-23 NOTE — Telephone Encounter (Signed)
I called pharmacy. PA is needed or she will have to try phentermine [phone]

## 2012-12-23 NOTE — Telephone Encounter (Signed)
As we discussed it can be obtained for $60-70/month w/a discount card Thx

## 2012-12-23 NOTE — Telephone Encounter (Signed)
Belviq PA is denied. Please advise.

## 2012-12-26 NOTE — Telephone Encounter (Signed)
She doesn't want Belviq, since Insurance won't cover it would be approximately $200. Can she take phentermine?

## 2012-12-26 NOTE — Telephone Encounter (Signed)
Belviq should e $60-70 with $75 off card; [facility] - $140-75=65 Can't take Phentermine due to HTN Thx

## 2012-12-27 NOTE — Telephone Encounter (Signed)
Pt informed

## 2013-01-05 ENCOUNTER — Encounter: Payer: Self-pay | Admitting: Internal Medicine

## 2013-01-05 ENCOUNTER — Encounter: Payer: Self-pay | Admitting: *Deleted

## 2013-01-24 ENCOUNTER — Other Ambulatory Visit (INDEPENDENT_AMBULATORY_CARE_PROVIDER_SITE_OTHER): Payer: Medicare Other

## 2013-01-24 ENCOUNTER — Encounter: Payer: Self-pay | Admitting: Internal Medicine

## 2013-01-24 ENCOUNTER — Ambulatory Visit (INDEPENDENT_AMBULATORY_CARE_PROVIDER_SITE_OTHER): Payer: Medicare Other | Admitting: Internal Medicine

## 2013-01-24 DIAGNOSIS — F329 Major depressive disorder, single episode, unspecified: Secondary | ICD-10-CM

## 2013-01-24 DIAGNOSIS — R7309 Other abnormal glucose: Secondary | ICD-10-CM

## 2013-01-24 DIAGNOSIS — G43909 Migraine, unspecified, not intractable, without status migrainosus: Secondary | ICD-10-CM

## 2013-01-24 DIAGNOSIS — F3289 Other specified depressive episodes: Secondary | ICD-10-CM

## 2013-01-24 DIAGNOSIS — J45909 Unspecified asthma, uncomplicated: Secondary | ICD-10-CM

## 2013-01-24 DIAGNOSIS — E119 Type 2 diabetes mellitus without complications: Secondary | ICD-10-CM

## 2013-01-24 DIAGNOSIS — I10 Essential (primary) hypertension: Secondary | ICD-10-CM

## 2013-01-24 DIAGNOSIS — F411 Generalized anxiety disorder: Secondary | ICD-10-CM

## 2013-01-24 LAB — BASIC METABOLIC PANEL
Calcium: 9.4 mg/dL (ref 8.4–10.5)
Creatinine, Ser: 1.1 mg/dL (ref 0.4–1.2)
GFR: 66.47 mL/min (ref >60.00)

## 2013-01-24 LAB — HEMOGLOBIN A1C: Hgb A1c MFr Bld: 6.1 % (ref 4.6–6.5)

## 2013-01-24 MED ORDER — HYDROCODONE-ACETAMINOPHEN 7.5-325 MG PO TABS: 1.0000 | ORAL_TABLET | Freq: Four times a day (QID) | ORAL | Status: DC | PRN

## 2013-01-24 MED ORDER — CYCLOBENZAPRINE HCL 10 MG PO TABS: 10.0000 mg | ORAL_TABLET | Freq: Three times a day (TID) | ORAL | Status: DC | PRN

## 2013-01-24 NOTE — Assessment & Plan Note (Signed)
Belviq is too expensive

## 2013-01-24 NOTE — Assessment & Plan Note (Signed)
Continue with current prescription therapy as reflected on the Med list.  

## 2013-01-24 NOTE — Assessment & Plan Note (Signed)
Labs Wt loss 

## 2013-01-24 NOTE — Progress Notes (Signed)
Subjective:    HPI  F/u neck pain - better; f/u LBP C/o bad hot flashes F/u on hot flashes and urinary incontinence Her dad is in a NH now  Her son died last year - 67 yo  The patient needs to address  chronic hypertension that has been well controlled with medicines; to address chronic  hyperlipidemia controlled with medicines as well; and to address type 2 chronic diabetes, controlled with medical treatment and diet.  R TKR Dr Josefine Class [place]. She had a manipulation x2 - it did not work... In group PT.   BP Readings from Last 3 Encounters:  01/24/13 130/78  10/21/12 120/82  10/11/12 132/104   Wt Readings from Last 3 Encounters:  01/24/13 198 lb (89.812 kg)  10/21/12 198 lb (89.812 kg)  10/11/12 190 lb (86.183 kg)      Review of Systems  Constitutional: Negative.  Negative for chills, diaphoresis, activity change, appetite change, fatigue and unexpected weight change.  HENT: Negative for congestion, ear pain, facial swelling, hearing loss, mouth sores, nosebleeds, postnasal drip, rhinorrhea, sinus pressure, sneezing, sore throat and tinnitus.   Eyes: Negative for pain, discharge, redness, itching and visual disturbance.  Respiratory: Negative for cough, chest tightness, shortness of breath, wheezing and stridor.   Cardiovascular: Negative for palpitations and leg swelling.  Gastrointestinal: Negative for nausea, diarrhea, constipation, blood in stool, abdominal distention, anal bleeding and rectal pain.  Genitourinary: Negative for dysuria, urgency, frequency, hematuria, flank pain, vaginal bleeding, vaginal discharge, difficulty urinating, genital sores and pelvic pain.  Musculoskeletal: Positive for arthralgias and gait problem. Negative for back pain, joint swelling and neck stiffness.       R knee pain  Skin: Negative.  Negative for rash.  Neurological: Negative for dizziness, tremors, seizures, syncope and speech difficulty.  Hematological: Negative for  adenopathy. Does not bruise/bleed easily.  Psychiatric/Behavioral: Positive for sleep disturbance. Negative for suicidal ideas, behavioral problems, dysphoric mood and decreased concentration. The patient is not nervous/anxious.        Objective:   Physical Exam  Constitutional: She appears well-developed. No distress.  Obese  HENT:  Head: Normocephalic.  Right Ear: External ear normal.  Left Ear: External ear normal.  Nose: Nose normal.  Mouth/Throat: Oropharynx is clear and moist.  Eyes: Conjunctivae are normal. Pupils are equal, round, and reactive to light. Right eye exhibits no discharge. Left eye exhibits no discharge.  Neck: Normal range of motion. Neck supple. No JVD present. No tracheal deviation present. No thyromegaly present.  Cardiovascular: Normal rate, regular rhythm and normal heart sounds.  Exam reveals no gallop and no friction rub.   No murmur heard. Pulmonary/Chest: No stridor. No respiratory distress. She has no wheezes.  Abdominal: Soft. Bowel sounds are normal. She exhibits no distension and no mass. There is no tenderness. There is no rebound and no guarding.  Musculoskeletal: She exhibits tenderness (R knee is stiff). She exhibits no edema.  Neck is less tender  Lymphadenopathy:    She has no cervical adenopathy.  Neurological: She displays normal reflexes. No cranial nerve deficit. She exhibits normal muscle tone. Coordination normal.  Skin: No rash noted. No erythema. No pallor.  Psychiatric: She has a normal mood and affect. Her behavior is normal. Judgment and thought content normal.   L shoulder is 1 inch higher Neck ROM is ok, tender   Lab Results  Component Value Date   WBC 8.4 03/04/2011   HGB 13.1 03/04/2011   HCT 41.2 03/04/2011  PLT 353.0 03/04/2011   GLUCOSE 71 07/19/2012   CHOL 220* 02/10/2010   TRIG 88.0 02/10/2010   HDL 85.10 02/10/2010   LDLDIRECT 109.8 02/10/2010   LDLCALC 111* 04/06/2006   ALT 18 03/04/2011   AST 19 03/04/2011   NA 141  07/19/2012   K 4.4 07/19/2012   CL 103 07/19/2012   CREATININE 1.0 07/19/2012   BUN 12 07/19/2012   CO2 30 07/19/2012   TSH 1.46 03/04/2011   HGBA1C 6.2 07/19/2012          Assessment & Plan:

## 2013-02-08 DIAGNOSIS — H2510 Age-related nuclear cataract, unspecified eye: Secondary | ICD-10-CM | POA: Insufficient documentation

## 2013-02-22 ENCOUNTER — Telehealth: Payer: Self-pay | Admitting: *Deleted

## 2013-02-22 NOTE — Telephone Encounter (Signed)
Pt called states she spoke with her insurance company and they are requesting a phone call in order to rush the appeal process due to not receiving letter [id number].  The phone number is [phone].  Urgent request is to be specified when calling.

## 2013-02-27 ENCOUNTER — Encounter: Payer: Self-pay | Admitting: *Deleted

## 2013-02-27 NOTE — Telephone Encounter (Signed)
I spoke to pt- updated letter mailed to Pharmacy Appeals Dept at [place], [place]  [id number].

## 2013-03-21 ENCOUNTER — Encounter: Payer: Self-pay | Admitting: Internal Medicine

## 2013-03-28 ENCOUNTER — Telehealth: Payer: Self-pay | Admitting: Internal Medicine

## 2013-03-28 NOTE — Telephone Encounter (Signed)
Pt wants something called in .  She has chills, fever, cough, congestion.

## 2013-03-29 NOTE — Telephone Encounter (Signed)
Use over-the-counter  "cold" medicines  such as  "Afrin" nasal spray for nasal congestion as directed instead. Use" Delsym" or" Robitussin" cough syrup varietis for cough.  You can use plain "Tylenol" or "Advi"l for fever, chills and achyness.   "Common cold" symptoms are usually triggered by a virus.  The antibiotics are usually not necessary. On average, a" viral cold" illness would take 4-7 days to resolve. Please, make an appointment if you are not better or if you're worse.  

## 2013-03-30 NOTE — Telephone Encounter (Signed)
Pt is aware.  

## 2013-04-19 ENCOUNTER — Telehealth: Payer: Self-pay | Admitting: Internal Medicine

## 2013-04-19 MED ORDER — DEXLANSOPRAZOLE 60 MG PO CPDR: 60.0000 mg | DELAYED_RELEASE_CAPSULE | Freq: Every day | ORAL | Status: DC

## 2013-04-19 NOTE — Telephone Encounter (Signed)
Patient states that [facility] told her they sent an rx request for Dexilant to our office 3 weeks ago with no response. I advised that we have not gotten any correspondence from them. However, she has the fax number for [facility], Therefore, I have sent a new prescription to the pharmacy (fax [phone]) per patient request. She verbalizes understanding.

## 2013-04-21 ENCOUNTER — Telehealth: Payer: Self-pay | Admitting: Internal Medicine

## 2013-04-21 NOTE — Telephone Encounter (Signed)
We did. I have filled it out and am awaiting Dr Blanch Media signature. It is on his desk for review. Patient advised.

## 2013-04-25 ENCOUNTER — Encounter: Payer: Self-pay | Admitting: Internal Medicine

## 2013-04-25 ENCOUNTER — Ambulatory Visit (INDEPENDENT_AMBULATORY_CARE_PROVIDER_SITE_OTHER): Payer: Medicare Other | Admitting: Internal Medicine

## 2013-04-25 DIAGNOSIS — K219 Gastro-esophageal reflux disease without esophagitis: Secondary | ICD-10-CM

## 2013-04-25 DIAGNOSIS — R21 Rash and other nonspecific skin eruption: Secondary | ICD-10-CM

## 2013-04-25 DIAGNOSIS — F329 Major depressive disorder, single episode, unspecified: Secondary | ICD-10-CM

## 2013-04-25 DIAGNOSIS — F3289 Other specified depressive episodes: Secondary | ICD-10-CM

## 2013-04-25 DIAGNOSIS — I10 Essential (primary) hypertension: Secondary | ICD-10-CM

## 2013-04-25 DIAGNOSIS — Z23 Encounter for immunization: Secondary | ICD-10-CM

## 2013-04-25 DIAGNOSIS — J45909 Unspecified asthma, uncomplicated: Secondary | ICD-10-CM

## 2013-04-25 DIAGNOSIS — F411 Generalized anxiety disorder: Secondary | ICD-10-CM

## 2013-04-25 MED ORDER — PROMETHAZINE-CODEINE 6.25-10 MG/5ML PO SYRP: 5.0000 mL | ORAL_SOLUTION | Freq: Four times a day (QID) | ORAL | Status: DC | PRN

## 2013-04-25 MED ORDER — DICLOFENAC SODIUM 1 % TD GEL: 4.0000 g | Freq: Four times a day (QID) | TRANSDERMAL | Status: DC

## 2013-04-25 NOTE — Assessment & Plan Note (Signed)
Continue with current prescription therapy as reflected on the Med list.  

## 2013-04-25 NOTE — Assessment & Plan Note (Signed)
Wt Readings from Last 3 Encounters:  04/25/13 196 lb (88.905 kg)  01/24/13 198 lb (89.812 kg)  10/21/12 198 lb (89.812 kg)  Belviq caused side effects

## 2013-04-25 NOTE — Assessment & Plan Note (Addendum)
Continue with current prescription therapy as reflected on the Med list. Cough syr prn

## 2013-04-25 NOTE — Progress Notes (Signed)
Pre visit review using our clinic review tool, if applicable. No additional management support is needed unless otherwise documented below in the visit note. 

## 2013-04-27 ENCOUNTER — Encounter: Payer: Self-pay | Admitting: Internal Medicine

## 2013-04-27 NOTE — Progress Notes (Signed)
Subjective:    HPI  F/u neck pain - better; f/u LBP F/u bad hot flashes F/u on hot flashes and urinary incontinence Her dad is in a NH now  Her son died last year - 68 yo  The patient needs to address  chronic hypertension that has been well controlled with medicines; to address chronic  hyperlipidemia controlled with medicines as well; and to address type 2 chronic diabetes, controlled with medical treatment and diet.  R TKR Dr Magda Bernheim [place]. She had a manipulation x2 - it did not work... In group PT.   BP Readings from Last 3 Encounters:  04/25/13 130/80  01/24/13 130/78  10/21/12 120/82   Wt Readings from Last 3 Encounters:  04/25/13 196 lb (88.905 kg)  01/24/13 198 lb (89.812 kg)  10/21/12 198 lb (89.812 kg)      Review of Systems  Constitutional: Negative.  Negative for chills, diaphoresis, activity change, appetite change, fatigue and unexpected weight change.  HENT: Negative for congestion, ear pain, facial swelling, hearing loss, mouth sores, nosebleeds, postnasal drip, rhinorrhea, sinus pressure, sneezing, sore throat and tinnitus.   Eyes: Negative for pain, discharge, redness, itching and visual disturbance.  Respiratory: Negative for cough, chest tightness, shortness of breath, wheezing and stridor.   Cardiovascular: Negative for palpitations and leg swelling.  Gastrointestinal: Negative for nausea, diarrhea, constipation, blood in stool, abdominal distention, anal bleeding and rectal pain.  Genitourinary: Negative for dysuria, urgency, frequency, hematuria, flank pain, vaginal bleeding, vaginal discharge, difficulty urinating, genital sores and pelvic pain.  Musculoskeletal: Positive for arthralgias and gait problem. Negative for back pain, joint swelling and neck stiffness.       R knee pain  Skin: Negative.  Negative for rash.  Neurological: Negative for dizziness, tremors, seizures, syncope and speech difficulty.  Hematological: Negative for  adenopathy. Does not bruise/bleed easily.  Psychiatric/Behavioral: Positive for sleep disturbance. Negative for suicidal ideas, behavioral problems, dysphoric mood and decreased concentration. The patient is not nervous/anxious.        Objective:   Physical Exam  Constitutional: She appears well-developed. No distress.  Obese  HENT:  Head: Normocephalic.  Right Ear: External ear normal.  Left Ear: External ear normal.  Nose: Nose normal.  Mouth/Throat: Oropharynx is clear and moist.  Eyes: Conjunctivae are normal. Pupils are equal, round, and reactive to light. Right eye exhibits no discharge. Left eye exhibits no discharge.  Neck: Normal range of motion. Neck supple. No JVD present. No tracheal deviation present. No thyromegaly present.  Cardiovascular: Normal rate, regular rhythm and normal heart sounds.  Exam reveals no gallop and no friction rub.   No murmur heard. Pulmonary/Chest: No stridor. No respiratory distress. She has no wheezes.  Abdominal: Soft. Bowel sounds are normal. She exhibits no distension and no mass. There is no tenderness. There is no rebound and no guarding.  Musculoskeletal: She exhibits tenderness (R knee is stiff). She exhibits no edema.  Neck is less tender  Lymphadenopathy:    She has no cervical adenopathy.  Neurological: She displays normal reflexes. No cranial nerve deficit. She exhibits normal muscle tone. Coordination normal.  Skin: No rash noted. No erythema. No pallor.  Psychiatric: She has a normal mood and affect. Her behavior is normal. Judgment and thought content normal.   L shoulder is 1 inch higher Neck ROM is ok, tender   Lab Results  Component Value Date   WBC 8.4 03/04/2011   HGB 13.1 03/04/2011   HCT 41.2 03/04/2011  PLT 353.0 03/04/2011   GLUCOSE 103* 01/24/2013   CHOL 220* 02/10/2010   TRIG 88.0 02/10/2010   HDL 85.10 02/10/2010   LDLDIRECT 109.8 02/10/2010   LDLCALC 111* 04/06/2006   ALT 18 03/04/2011   AST 19 03/04/2011   NA 139  01/24/2013   K 4.4 01/24/2013   CL 103 01/24/2013   CREATININE 1.1 01/24/2013   BUN 13 01/24/2013   CO2 31 01/24/2013   TSH 1.46 03/04/2011   HGBA1C 6.1 01/24/2013          Assessment & Plan:

## 2013-04-28 ENCOUNTER — Telehealth: Payer: Self-pay | Admitting: Internal Medicine

## 2013-04-28 NOTE — Telephone Encounter (Signed)
Pt waiting for Papers to be faxed to [facility] for her Dexilant. Let pt know I would check on this for her and see if we could get this taken care of, will call pt and let her know.

## 2013-04-28 NOTE — Telephone Encounter (Signed)
Spoke with pt and let her know the papers have been faxed for her to [facility].

## 2013-05-12 ENCOUNTER — Ambulatory Visit (INDEPENDENT_AMBULATORY_CARE_PROVIDER_SITE_OTHER): Payer: Medicare Other | Admitting: Internal Medicine

## 2013-05-12 ENCOUNTER — Ambulatory Visit: Payer: Self-pay | Admitting: Family Medicine

## 2013-05-12 ENCOUNTER — Encounter: Payer: Self-pay | Admitting: Internal Medicine

## 2013-05-12 ENCOUNTER — Telehealth: Payer: Self-pay | Admitting: Internal Medicine

## 2013-05-12 VITALS — BP 150/90 | HR 109 | Temp 98.1°F | Wt 197.0 lb

## 2013-05-12 DIAGNOSIS — B370 Candidal stomatitis: Secondary | ICD-10-CM

## 2013-05-12 DIAGNOSIS — K130 Diseases of lips: Secondary | ICD-10-CM

## 2013-05-12 DIAGNOSIS — R220 Localized swelling, mass and lump, head: Secondary | ICD-10-CM

## 2013-05-12 DIAGNOSIS — L988 Other specified disorders of the skin and subcutaneous tissue: Secondary | ICD-10-CM

## 2013-05-12 DIAGNOSIS — R238 Other skin changes: Secondary | ICD-10-CM

## 2013-05-12 MED ORDER — HYDROXYZINE HCL 10 MG PO TABS: 10.0000 mg | ORAL_TABLET | Freq: Four times a day (QID) | ORAL | Status: DC | PRN

## 2013-05-12 MED ORDER — FLUCONAZOLE 150 MG PO TABS: 150.0000 mg | ORAL_TABLET | Freq: Once | ORAL | Status: DC

## 2013-05-12 MED ORDER — CLOTRIMAZOLE 10 MG MT TROC: 10.0000 mg | Freq: Every day | OROMUCOSAL | Status: DC

## 2013-05-12 NOTE — Progress Notes (Signed)
   Subjective:    Patient ID: Mallory Klein, female    DOB: 08/27/1945, 68 y.o.   MRN: [id number]  HPI Wednesday, 05/10/13 patient had OD cataract removal by Dr. Gershon Crane. Wednesday prior she had OS cataract removed.  She awoke Wednesday evening to a sensation of lip tightness, and noticed her lips were swollen with associated throat soreness. She denies tongue edema or airway compromise.  She has never had  this before; new medications include ofloxacin otic drops as of 05/02/13. She is now experiencing small red papules on lips, back and arms.  Denies recent travel, new detergents, cosmetics, foods, or medications other than otic drops aforementioned.  Of note, her husband was seen on 04/28/13 here for what was believed to be folliculitis on his arms, neck, and back; he reports he took a 14-day course of prednisone, however based on notes he was treated with hydroxyzine and ranitidine.  They have a dog in the home. Her med list was reviewed; there is no sulfa agent. She uses Advair w/o gargling.This was last used earlier this week. Problem List lists Eczema.  Her last A1c was 6.1% in December 2014.   Review of Systems She denies fever, chills, sweats, weight loss.  No rhinosinusitis symptoms or signs. No rash in inguinal area or under breast.     Objective:   Physical Exam General appearance:well nourished; no acute distress or increased work of breathing is present. No lymphadenopathy about the head, neck, or axilla noted.  Eyes: No conjunctival inflammation or lid edema is present. There is no scleral icterus.  Ears: External ear exam shows no significant lesions or deformities. Otoscopic examination reveals clear canals, tympanic membranes are intact bilaterally without bulging, retraction, inflammation or discharge.  Nose: External nasal examination shows no deformity or inflammation. Nasal mucosa are pink and moist without lesions or exudates. No septal dislocation or deviation.No  obstruction to airflow.  Oral exam: Dental hygiene is good; lips and gums are healthy appearing.Lips do not appear significantly swollen. She has significant posterior pharyngeal erythema. The buccal mucosa reveals scattered small papules. There no definite blisters present. Clinically oral Candida suggested. Angioedema is not seen. Neck: No deformities, thyromegaly, masses, or tenderness noted. Supple with full range of motion without pain.  Heart: Normal rate and regular rhythm. Grade 1/6 systolic murmur and S2 normal without click, rub or other extra sounds.  Lungs:Chest clear to auscultation; no wheezes, rhonchi,rales ,or rubs present.No increased work of breathing. No upper airway compromise with hyperventilation maneuver. Extremities: No cyanosis or clubbing noted. Trace edema @ sockline.  Skin: Warm & dry; scattered small diffuse red papules noted on lips, neck, arms bilaterally, and lumbosacral area. No blistering , pustule formation or exfoliation.No intertriginous hand lesions. Eczema not suggested.     Assessment & Plan:  #1 History suggests angioedema variant ;the most likely etiology would be drug reaction. The classical appearance of Stevens-Johnson syndrome is not present. No upper airway compromise. Physical exam suggests oral Candida, most likely from Advair use w/o gargling. With history of skin lesions in husband ; we must consider vector from their dog such as fleas. Clinically no scabies suggested.  Plan: See orders. The most important thing would be to eliminate as many nonessential medications as possible in view of the oral lesions. If these progress or if respiratory compromise appears; she should go to the emergency room. Derm referral if lesions persist or progress.

## 2013-05-12 NOTE — Progress Notes (Signed)
   Subjective:    Patient ID: Mallory Klein, female    DOB: Apr 04, 1945, 68 y.o.   MRN: [id number]  HPI Wednesday night, 05/10/13 patient had R eye cataract removal by Dr. Gershon Crane. Wednesday prior she had L eye cataract removed.  She awoke Wednesday evening to a sensation of lip tightness, and noticed her lips were swollen and throat soreness. She denies tongue edema or airway compromise.  She has never this happen before; new medications include ofloxacin otic drops since 05/02/13. She is now experiencing small red papules on lips, back and arms. Denies recent travel, new detergents, cosmetics, foods, or medications other than otic drops aforementioned. Of note, her husband was seen on 04/28/13 here for what was believed to be folliculitis on his arms, neck, and back; he was treated with 14-day course of prednisone.    Review of Systems She denies fever, chills, sweats, weight loss.       Objective:   Physical Exam General appearance:good health ;well nourished; no acute distress or increased work of breathing is present.  No  lymphadenopathy about the head, neck, or axilla noted.  Eyes: No conjunctival inflammation or lid edema is present. There is no scleral icterus. Ears:  External ear exam shows no significant lesions or deformities.  Otoscopic examination reveals clear canals, tympanic membranes are intact bilaterally without bulging, retraction, inflammation or discharge. Nose:  External nasal examination shows no deformity or inflammation. Nasal mucosa are pink and moist without lesions or exudates. No septal dislocation or deviation.No obstruction to airflow.  Oral exam: Dental hygiene is good; lips and gums are healthy appearing.There is no oropharyngeal erythema or exudate noted.  Neck:  No deformities, thyromegaly, masses, or tenderness noted.   Supple with full range of motion without pain.  Heart:  Normal rate and regular rhythm. Grade 1/6 systolic murmur and S2 normal without  click, rub or other extra sounds.  Lungs:Chest clear to auscultation; no wheezes, rhonchi,rales ,or rubs present.No increased work of breathing.   Extremities:  No cyanosis, edema, or clubbing  noted  Skin: Warm & dry; diffuse red papules noted on lips, neck, arms bilaterally, and lumbosacral area.      Assessment & Plan:

## 2013-05-12 NOTE — Telephone Encounter (Signed)
Patient Information:  Caller Name: Shephanie  Phone: [phone]  Patient: Mallory Klein  Gender: Female  DOB: Oct 19, 1945  Age: 68 Years  PCP: Plotnikov, Alex (Adults only)  Office Follow Up:  Does the office need to follow up with this patient?: No  Instructions For The Office: N/A   Symptoms  Reason For Call & Symptoms: Calling about having Cataract Surgery on L eye on 05/03/13 and on R eye on 05/10/13. Started with widespread itching in the eveing on 05/10/13. Called Opthamologist and they referred her to Aneasthologist, who thought it may be the drops that given before procedure but she didn't have any reaction to first procedure. Hx Asthma and allergies. Takes Zyrtec daily and  took Benedryl last night and didn't help. Lip were swollen and throat hurt when she woke up yesterday morning-05/11/13. She is swallowing fine. Sx are the same today and still itchy patches widespread. She started new eye drop on 05/03/13 Prolensa and Ofloxacin.  Reviewed Health History In EMR: Yes  Reviewed Medications In EMR: Yes  Reviewed Allergies In EMR: Yes  Reviewed Surgeries / Procedures: Yes  Date of Onset of Symptoms: 05/11/2013  Guideline(s) Used:  Rash - Widespread on Drugs - Drug Reaction  Disposition Per Guideline:   Go to Office Now  Reason For Disposition Reached:   Face becomes swollen  Advice Given:  For Non-itchy Rashes:  No treatment is necessary.  For Itchy Rashes:  Wash the skin once with soap to remove any irritants. Use Benadryl or take an Aveeno bath to reduce the itching.  Contagiousness:  Avoid contact with pregnant women until a diagnosis is made. Most viral rashes are contagious (especially if a fever is present). Allergic drug rashes are not contagious.  Call Back If:  You become worse.  Patient Will Follow Care Advice:  YES  Appointment Scheduled:  05/12/2013 10:45:00 Appointment Scheduled Provider:  Charlann Boxer

## 2013-05-12 NOTE — Patient Instructions (Signed)
If Advair used; gargle and spit after use. You'll be placed on medications for thrush. If you have increasing shortness of breath or swelling of the lips; it is imperative that you go to the emergency room.

## 2013-05-12 NOTE — Progress Notes (Signed)
Pre visit review using our clinic review tool, if applicable. No additional management support is needed unless otherwise documented below in the visit note. 

## 2013-06-23 ENCOUNTER — Other Ambulatory Visit: Payer: Self-pay | Admitting: Internal Medicine

## 2013-07-25 ENCOUNTER — Encounter: Payer: Self-pay | Admitting: Internal Medicine

## 2013-07-25 ENCOUNTER — Ambulatory Visit (INDEPENDENT_AMBULATORY_CARE_PROVIDER_SITE_OTHER): Payer: Medicare Other | Admitting: Internal Medicine

## 2013-07-25 VITALS — BP 130/90 | HR 80 | Temp 97.9°F | Resp 16 | Wt 199.0 lb

## 2013-07-25 DIAGNOSIS — M25569 Pain in unspecified knee: Secondary | ICD-10-CM

## 2013-07-25 DIAGNOSIS — F3289 Other specified depressive episodes: Secondary | ICD-10-CM

## 2013-07-25 DIAGNOSIS — M25561 Pain in right knee: Secondary | ICD-10-CM

## 2013-07-25 DIAGNOSIS — E119 Type 2 diabetes mellitus without complications: Secondary | ICD-10-CM

## 2013-07-25 DIAGNOSIS — F329 Major depressive disorder, single episode, unspecified: Secondary | ICD-10-CM

## 2013-07-25 DIAGNOSIS — J45909 Unspecified asthma, uncomplicated: Secondary | ICD-10-CM

## 2013-07-25 DIAGNOSIS — M545 Low back pain, unspecified: Secondary | ICD-10-CM

## 2013-07-25 DIAGNOSIS — I10 Essential (primary) hypertension: Secondary | ICD-10-CM

## 2013-07-25 NOTE — Assessment & Plan Note (Signed)
Continue with current prescription therapy as reflected on the Med list.  

## 2013-07-25 NOTE — Progress Notes (Signed)
Pre visit review using our clinic review tool, if applicable. No additional management support is needed unless otherwise documented below in the visit note. 

## 2013-07-25 NOTE — Assessment & Plan Note (Signed)
Dr Coralie Common ordered an MRI and uped her Norco to 10/325 and gave her Methacarbamol 750 tid prn Chronic

## 2013-07-25 NOTE — Assessment & Plan Note (Signed)
Wt Readings from Last 3 Encounters:  07/25/13 199 lb (90.266 kg)  05/12/13 197 lb (89.359 kg)  04/25/13 196 lb (88.905 kg)

## 2013-07-25 NOTE — Assessment & Plan Note (Signed)
Doing fair Chronic pain Grief, stress

## 2013-07-25 NOTE — Progress Notes (Signed)
Subjective:    HPI  F/u neck pain - better; f/u LBP. She saw Dr Ronnald Ramp - her NS yesterday. F/u bad hot flashes F/u on hot flashes and urinary incontinence Her dad is in a NH now  Her son died last year - 68 yo  The patient needs to address  chronic hypertension that has been well controlled with medicines; to address chronic  hyperlipidemia controlled with medicines as well; and to address type 2 chronic diabetes, controlled with medical treatment and diet.  R TKR Dr Magda Bernheim [place] 2[place]. She had a manipulation x2 - it did not work... In group PT.   BP Readings from Last 3 Encounters:  07/25/13 130/90  05/12/13 150/90  04/25/13 130/80   Wt Readings from Last 3 Encounters:  07/25/13 199 lb (90.266 kg)  05/12/13 197 lb (89.359 kg)  04/25/13 196 lb (88.905 kg)      Review of Systems  Constitutional: Negative.  Negative for chills, diaphoresis, activity change, appetite change, fatigue and unexpected weight change.  HENT: Negative for congestion, ear pain, facial swelling, hearing loss, mouth sores, nosebleeds, postnasal drip, rhinorrhea, sinus pressure, sneezing, sore throat and tinnitus.   Eyes: Negative for pain, discharge, redness, itching and visual disturbance.  Respiratory: Negative for cough, chest tightness, shortness of breath, wheezing and stridor.   Cardiovascular: Negative for palpitations and leg swelling.  Gastrointestinal: Negative for nausea, diarrhea, constipation, blood in stool, abdominal distention, anal bleeding and rectal pain.  Genitourinary: Negative for dysuria, urgency, frequency, hematuria, flank pain, vaginal bleeding, vaginal discharge, difficulty urinating, genital sores and pelvic pain.  Musculoskeletal: Positive for arthralgias and gait problem. Negative for back pain, joint swelling and neck stiffness.       R knee pain  Skin: Negative.  Negative for rash.  Neurological: Negative for dizziness, tremors, seizures, syncope and speech  difficulty.  Hematological: Negative for adenopathy. Does not bruise/bleed easily.  Psychiatric/Behavioral: Positive for sleep disturbance. Negative for suicidal ideas, behavioral problems, dysphoric mood and decreased concentration. The patient is not nervous/anxious.        Objective:   Physical Exam  Constitutional: She appears well-developed. No distress.  Obese  HENT:  Head: Normocephalic.  Right Ear: External ear normal.  Left Ear: External ear normal.  Nose: Nose normal.  Mouth/Throat: Oropharynx is clear and moist.  Eyes: Conjunctivae are normal. Pupils are equal, round, and reactive to light. Right eye exhibits no discharge. Left eye exhibits no discharge.  Neck: Normal range of motion. Neck supple. No JVD present. No tracheal deviation present. No thyromegaly present.  Cardiovascular: Normal rate, regular rhythm and normal heart sounds.  Exam reveals no gallop and no friction rub.   No murmur heard. Pulmonary/Chest: No stridor. No respiratory distress. She has no wheezes.  Abdominal: Soft. Bowel sounds are normal. She exhibits no distension and no mass. There is no tenderness. There is no rebound and no guarding.  Musculoskeletal: She exhibits tenderness (R knee is stiff). She exhibits no edema.  Neck is less tender  Lymphadenopathy:    She has no cervical adenopathy.  Neurological: She displays normal reflexes. No cranial nerve deficit. She exhibits normal muscle tone. Coordination normal.  Skin: No rash noted. No erythema. No pallor.  Psychiatric: She has a normal mood and affect. Her behavior is normal. Judgment and thought content normal.  Deformed gait/cane L shoulder is 1 inch higher Neck ROM is ok, tender   Lab Results  Component Value Date   WBC 8.4 03/04/2011  HGB 13.1 03/04/2011   HCT 41.2 03/04/2011   PLT 353.0 03/04/2011   GLUCOSE 103* 01/24/2013   CHOL 220* 02/10/2010   TRIG 88.0 02/10/2010   HDL 85.10 02/10/2010   LDLDIRECT 109.8 02/10/2010   LDLCALC 111*  04/06/2006   ALT 18 03/04/2011   AST 19 03/04/2011   NA 139 01/24/2013   K 4.4 01/24/2013   CL 103 01/24/2013   CREATININE 1.1 01/24/2013   BUN 13 01/24/2013   CO2 31 01/24/2013   TSH 1.46 03/04/2011   HGBA1C 6.1 01/24/2013          Assessment & Plan:

## 2013-07-25 NOTE — Assessment & Plan Note (Signed)
No change lately

## 2013-07-26 ENCOUNTER — Telehealth: Payer: Self-pay | Admitting: Internal Medicine

## 2013-07-26 NOTE — Telephone Encounter (Signed)
Relevant patient education assigned to patient using Emmi. ° °

## 2013-08-16 ENCOUNTER — Encounter: Payer: Self-pay | Admitting: Internal Medicine

## 2013-08-17 ENCOUNTER — Other Ambulatory Visit: Payer: Self-pay | Admitting: Internal Medicine

## 2013-08-18 ENCOUNTER — Other Ambulatory Visit: Payer: Self-pay | Admitting: Internal Medicine

## 2013-08-18 NOTE — Telephone Encounter (Signed)
Pt called and stated she sent md mychart msg to get a refill on her promethazine cough syrup. Haven't received response. Pt states she is needing it and the paroxetine that was sent by pharmacy. Sent refill on paroxetine pls advise on cough syrup...[name]

## 2013-08-21 ENCOUNTER — Telehealth: Payer: Self-pay | Admitting: Internal Medicine

## 2013-08-21 ENCOUNTER — Other Ambulatory Visit (INDEPENDENT_AMBULATORY_CARE_PROVIDER_SITE_OTHER): Payer: Medicare Other

## 2013-08-21 DIAGNOSIS — I10 Essential (primary) hypertension: Secondary | ICD-10-CM

## 2013-08-21 DIAGNOSIS — E119 Type 2 diabetes mellitus without complications: Secondary | ICD-10-CM

## 2013-08-21 DIAGNOSIS — M545 Low back pain, unspecified: Secondary | ICD-10-CM

## 2013-08-21 DIAGNOSIS — M25569 Pain in unspecified knee: Secondary | ICD-10-CM

## 2013-08-21 DIAGNOSIS — F329 Major depressive disorder, single episode, unspecified: Secondary | ICD-10-CM

## 2013-08-21 DIAGNOSIS — F3289 Other specified depressive episodes: Secondary | ICD-10-CM

## 2013-08-21 DIAGNOSIS — J45909 Unspecified asthma, uncomplicated: Secondary | ICD-10-CM

## 2013-08-21 DIAGNOSIS — M25561 Pain in right knee: Secondary | ICD-10-CM

## 2013-08-21 LAB — LIPID PANEL
Cholesterol: 241 mg/dL — ABNORMAL HIGH (ref 0–200)
HDL: 82.1 mg/dL (ref >39.00)
LDL Cholesterol: 147 mg/dL — ABNORMAL HIGH (ref 0–99)
NonHDL: 158.9
TRIGLYCERIDES: 60 mg/dL (ref 0.0–149.0)
Total CHOL/HDL Ratio: 3
VLDL: 12 mg/dL (ref 0.0–40.0)

## 2013-08-21 LAB — BASIC METABOLIC PANEL
BUN: 13 mg/dL (ref 6–23)
CALCIUM: 9.2 mg/dL (ref 8.4–10.5)
CO2: 30 mEq/L (ref 19–32)
CREATININE: 1 mg/dL (ref 0.4–1.2)
Chloride: 101 mEq/L (ref 96–112)
GFR: 75.3 mL/min (ref >60.00)
GLUCOSE: 117 mg/dL — AB (ref 70–99)
Potassium: 3.8 mEq/L (ref 3.5–5.1)
Sodium: 139 mEq/L (ref 135–145)

## 2013-08-21 LAB — HEPATIC FUNCTION PANEL
ALT: 14 U/L (ref 0–35)
AST: 19 U/L (ref 0–37)
Albumin: 3.8 g/dL (ref 3.5–5.2)
Alkaline Phosphatase: 66 U/L (ref 39–117)
Bilirubin, Direct: 0.1 mg/dL (ref 0.0–0.3)
TOTAL PROTEIN: 8.4 g/dL — AB (ref 6.0–8.3)
Total Bilirubin: 0.5 mg/dL (ref 0.2–1.2)

## 2013-08-21 LAB — TSH: TSH: 0.7 u[IU]/mL (ref 0.35–4.50)

## 2013-08-21 LAB — HEMOGLOBIN A1C: Hgb A1c MFr Bld: 6.1 % (ref 4.6–6.5)

## 2013-08-21 MED ORDER — PROMETHAZINE-CODEINE 6.25-10 MG/5ML PO SYRP: ORAL_SOLUTION | ORAL | Status: DC

## 2013-08-21 NOTE — Telephone Encounter (Signed)
Ok Thx 

## 2013-08-21 NOTE — Telephone Encounter (Signed)
Pt has left several msg on triage requesting refill status on her cough syrup...Mallory Klein

## 2013-08-21 NOTE — Addendum Note (Signed)
Addended by: Earnstine Regal on: 08/21/2013 04:51 PM   Modules accepted: Orders

## 2013-08-21 NOTE — Telephone Encounter (Signed)
Done

## 2013-08-21 NOTE — Telephone Encounter (Signed)
Pt came in to request refill on medication: promethazine-codeine (PHENERGAN WITH CODEINE) 6.25-10 MG/5ML syrup . Pt states she has sent several requests on my chart and never received a response. Now the pt is completely out of the med. Please contact pt when request is completed.

## 2013-08-21 NOTE — Telephone Encounter (Signed)
Pt wants to pick up by 5:00 today.  She is out.

## 2013-08-21 NOTE — Telephone Encounter (Signed)
Notified pt md has ok refill will print off and have him to sign ready for pick-up...Mallory Klein

## 2013-10-16 ENCOUNTER — Telehealth: Payer: Self-pay | Admitting: Internal Medicine

## 2013-10-16 NOTE — Telephone Encounter (Signed)
Patient Information:  Caller Name: Elcie  Phone: [phone]  Patient: Mallory Klein, Mallory Klein  Gender: Female  DOB: March 01, 1945  Age: 68 Years  PCP: Plotnikov, Alex (Adults only)  Office Follow Up:  Does the office need to follow up with this patient?: No  Instructions For The Office: N/A  RN Note:  Blister at right nostril is enlarging; no drainage or pain.  Requested 3 month follow up appointment for 10/25/13 be cancelled; she will  reschedule it at another time. Cancellation completed by triage nurse.   Symptoms  Reason For Call & Symptoms: Small painless blister under right nostril  Reviewed Health History In EMR: Yes  Reviewed Medications In EMR: Yes  Reviewed Allergies In EMR: Yes  Reviewed Surgeries / Procedures: Yes  Date of Onset of Symptoms: 10/14/2013  Guideline(s) Used:  Skin Lesion - Moles or Growths  Disposition Per Guideline:   See Today or Tomorrow in Office  Reason For Disposition Reached:   Caller cannot describe it clearly  Advice Given:  Call Back If:  Fever or pain occurs  Any change in the mole or growth  You become worse.  Patient Will Follow Care Advice:  YES  Appointment Scheduled:  10/17/2013 11:00:00 Appointment Scheduled Provider:  Walker Kehr (Adults only)

## 2013-10-17 ENCOUNTER — Encounter: Payer: Self-pay | Admitting: Internal Medicine

## 2013-10-17 ENCOUNTER — Ambulatory Visit (INDEPENDENT_AMBULATORY_CARE_PROVIDER_SITE_OTHER): Payer: Medicare Other | Admitting: Internal Medicine

## 2013-10-17 VITALS — BP 120/92 | HR 100 | Temp 98.5°F | Wt 197.0 lb

## 2013-10-17 DIAGNOSIS — F3289 Other specified depressive episodes: Secondary | ICD-10-CM

## 2013-10-17 DIAGNOSIS — B009 Herpesviral infection, unspecified: Secondary | ICD-10-CM

## 2013-10-17 DIAGNOSIS — M5430 Sciatica, unspecified side: Secondary | ICD-10-CM

## 2013-10-17 DIAGNOSIS — Z634 Disappearance and death of family member: Secondary | ICD-10-CM

## 2013-10-17 DIAGNOSIS — F4321 Adjustment disorder with depressed mood: Secondary | ICD-10-CM

## 2013-10-17 DIAGNOSIS — B001 Herpesviral vesicular dermatitis: Secondary | ICD-10-CM | POA: Insufficient documentation

## 2013-10-17 DIAGNOSIS — M5441 Lumbago with sciatica, right side: Secondary | ICD-10-CM

## 2013-10-17 DIAGNOSIS — I10 Essential (primary) hypertension: Secondary | ICD-10-CM

## 2013-10-17 DIAGNOSIS — F329 Major depressive disorder, single episode, unspecified: Secondary | ICD-10-CM

## 2013-10-17 MED ORDER — ACYCLOVIR 400 MG PO TABS
400.0000 mg | ORAL_TABLET | Freq: Three times a day (TID) | ORAL | Status: DC
Start: 2013-10-17 — End: 2014-02-09

## 2013-10-17 NOTE — Progress Notes (Signed)
Subjective:    HPI  C/o rash under R nose since Sat F/u neck pain - better; f/u LBP. She saw Dr Ronnald Ramp - her NS yesterday. F/u bad hot flashes F/u on hot flashes and urinary incontinence Her dad is in a NH now  Her son died last year - 68 yo  The patient needs to address  chronic hypertension that has been well controlled with medicines; to address chronic  hyperlipidemia controlled with medicines as well; and to address type 2 chronic diabetes, controlled with medical treatment and diet.  R TKR Dr Magda Bernheim [place] 2[place]. She had a manipulation x2 - it did not work... In group PT.  LBP is better - had injections   BP Readings from Last 3 Encounters:  10/17/13 120/92  07/25/13 130/90  05/12/13 150/90   Wt Readings from Last 3 Encounters:  10/17/13 197 lb (89.359 kg)  07/25/13 199 lb (90.266 kg)  05/12/13 197 lb (89.359 kg)      Review of Systems  Constitutional: Negative.  Negative for chills, diaphoresis, activity change, appetite change, fatigue and unexpected weight change.  HENT: Negative for congestion, ear pain, facial swelling, hearing loss, mouth sores, nosebleeds, postnasal drip, rhinorrhea, sinus pressure, sneezing, sore throat and tinnitus.   Eyes: Negative for pain, discharge, redness, itching and visual disturbance.  Respiratory: Negative for cough, chest tightness, shortness of breath, wheezing and stridor.   Cardiovascular: Negative for palpitations and leg swelling.  Gastrointestinal: Negative for nausea, diarrhea, constipation, blood in stool, abdominal distention, anal bleeding and rectal pain.  Genitourinary: Negative for dysuria, urgency, frequency, hematuria, flank pain, vaginal bleeding, vaginal discharge, difficulty urinating, genital sores and pelvic pain.  Musculoskeletal: Positive for arthralgias and gait problem. Negative for back pain, joint swelling and neck stiffness.       R knee pain  Skin: Negative.  Negative for rash.  Neurological:  Negative for dizziness, tremors, seizures, syncope and speech difficulty.  Hematological: Negative for adenopathy. Does not bruise/bleed easily.  Psychiatric/Behavioral: Positive for sleep disturbance. Negative for suicidal ideas, behavioral problems, dysphoric mood and decreased concentration. The patient is not nervous/anxious.        Objective:   Physical Exam  Constitutional: She appears well-developed. No distress.  Obese  HENT:  Head: Normocephalic.  Right Ear: External ear normal.  Left Ear: External ear normal.  Nose: Nose normal.  Mouth/Throat: Oropharynx is clear and moist.  Eyes: Conjunctivae are normal. Pupils are equal, round, and reactive to light. Right eye exhibits no discharge. Left eye exhibits no discharge.  Neck: Normal range of motion. Neck supple. No JVD present. No tracheal deviation present. No thyromegaly present.  Cardiovascular: Normal rate, regular rhythm and normal heart sounds.  Exam reveals no gallop and no friction rub.   No murmur heard. Pulmonary/Chest: No stridor. No respiratory distress. She has no wheezes.  Abdominal: Soft. Bowel sounds are normal. She exhibits no distension and no mass. There is no tenderness. There is no rebound and no guarding.  Musculoskeletal: She exhibits tenderness (R knee is stiff). She exhibits no edema.  Neck is less tender  Lymphadenopathy:    She has no cervical adenopathy.  Neurological: She displays normal reflexes. No cranial nerve deficit. She exhibits normal muscle tone. Coordination normal.  Skin: No rash noted. No erythema. No pallor.  Psychiatric: She has a normal mood and affect. Her behavior is normal. Judgment and thought content normal.  Deformed gait/cane L shoulder is 1 inch higher Neck ROM is ok, tender Blister  cluster under R nostril   Lab Results  Component Value Date   WBC 8.4 03/04/2011   HGB 13.1 03/04/2011   HCT 41.2 03/04/2011   PLT 353.0 03/04/2011   GLUCOSE 117* 08/21/2013   CHOL 241* 08/21/2013    TRIG 60.0 08/21/2013   HDL 82.10 08/21/2013   LDLDIRECT 109.8 02/10/2010   LDLCALC 147* 08/21/2013   ALT 14 08/21/2013   AST 19 08/21/2013   NA 139 08/21/2013   K 3.8 08/21/2013   CL 101 08/21/2013   CREATININE 1.0 08/21/2013   BUN 13 08/21/2013   CO2 30 08/21/2013   TSH 0.70 08/21/2013   HGBA1C 6.1 08/21/2013          Assessment & Plan:

## 2013-10-17 NOTE — Assessment & Plan Note (Signed)
Doing fair 

## 2013-10-17 NOTE — Progress Notes (Signed)
Pre visit review using our clinic review tool, if applicable. No additional management support is needed unless otherwise documented below in the visit note. 

## 2013-10-17 NOTE — Assessment & Plan Note (Signed)
Wt Readings from Last 3 Encounters:  10/17/13 197 lb (89.359 kg)  07/25/13 199 lb (90.266 kg)  05/12/13 197 lb (89.359 kg)

## 2013-10-17 NOTE — Assessment & Plan Note (Signed)
Better w/injection

## 2013-10-17 NOTE — Assessment & Plan Note (Signed)
Continue with current prescription therapy as reflected on the Med list.  

## 2013-10-17 NOTE — Assessment & Plan Note (Signed)
Acyclovir po 

## 2013-10-18 ENCOUNTER — Telehealth: Payer: Self-pay | Admitting: Internal Medicine

## 2013-10-18 NOTE — Telephone Encounter (Signed)
Patient Information:  Caller Name: Laylaa  Phone: [phone]  Patient: Mallory Klein  Gender: Female  DOB: 02/24/1945  Age: 68 Years  PCP: Plotnikov, Alex (Adults only)  Office Follow Up:  Does the office need to follow up with this patient?: Yes  Instructions For The Office: Medication question.  Please call back to advise about continued use of Acyclovir with pruritus.  RN Note:  Has not take Acyclovir dose 10/18/13. Has generic Benadryl and Zyrtec at home; will take 2 Diphenhydramine now for itching. Instructed to remain off Acyclovir until further information from MD. Informed staff will call back to advise if MD recommends continuing Acyclovir for cold sore.  Symptoms  Reason For Call & Symptoms: Woke up with generalized itching after beginning Acyclovir.  Took 2 doses 10/17/13. No hives.  Reviewed Health History In EMR: Yes  Reviewed Medications In EMR: Yes  Reviewed Allergies In EMR: Yes  Reviewed Surgeries / Procedures: Yes  Date of Onset of Symptoms: 10/18/2013  Guideline(s) Used:  Itching - Widespread  Disposition Per Guideline:   Discuss with PCP and Callback by Nurse Today  Reason For Disposition Reached:   Taking prescription medication that could cause itching (e.g., codeine/morphine/other opiates, aspirin)  Advice Given:  Timmothy Sours  Try not to scratch.  Itching is often worsened by scratching (the "Itch-Scratch" cycle).  Cut the fingernails short. (Reason: prevent secondary bacterial infection.)  Avoid Triggers:  Avoid hot showers and baths. (Reason: heat increases itching.)  Avoid itchy or tight clothing (especially wool).  Avoid sleeping with too many blankets. (Reason: heat and sweating aggravates itching.)  Oral Antihistamine Medication for Itching:  Take an antihistamine by mouth to reduce the itching. Diphenhydramine (Benadryl) is available over-the-counter. Adult dose is 25-50 mg. Take it up to 4 times a day.  Antihistamines may cause sleepiness. Do not  drink, drive, or operate dangerous machinery while taking antihistamines.  An over-the-counter antihistamine that causes less sleepiness is loratadine (e.g., Alavert or Claritin).  Call Back If:  Rash occurs  Itching becomes worse or lasts over 48 hours  You become worse.  Patient Will Follow Care Advice:  YES

## 2013-10-20 NOTE — Telephone Encounter (Signed)
Closing encounter pt saw mon 10/17/13...[name]

## 2013-10-25 ENCOUNTER — Ambulatory Visit: Payer: Medicare Other | Admitting: Internal Medicine

## 2014-01-12 ENCOUNTER — Other Ambulatory Visit: Payer: Self-pay | Admitting: Neurological Surgery

## 2014-01-12 DIAGNOSIS — M48061 Spinal stenosis, lumbar region without neurogenic claudication: Secondary | ICD-10-CM

## 2014-01-17 ENCOUNTER — Ambulatory Visit
Admission: RE | Admit: 2014-01-17 | Discharge: 2014-01-17 | Disposition: A | Discharge status: Home / Self Care | Payer: Medicare Other | Source: Ambulatory Visit | Attending: Neurological Surgery | Admitting: Neurological Surgery

## 2014-01-17 DIAGNOSIS — M48061 Spinal stenosis, lumbar region without neurogenic claudication: Secondary | ICD-10-CM

## 2014-01-17 IMAGING — RF DG MYELOGRAPHY LUMBAR INJ LUMBOSACRAL
13 of 17 series · 13 of 17 positions shown · non-contrast
Comparison: MRI lumbar spine [DATE]

CLINICAL DATA: Low back pain extending to the right hip and groin.
Pain into the anterior thigh. Weakness in the right lower extremity.
TECHNIQUE: Contiguous axial images were obtained through the Lumbar spine after
the intrathecal infusion of infusion. Coronal and sagittal
reconstructions were obtained of the axial image sets.

[Series 1: (hospital) · 1 of 1 slices shown (1 of 2)]
[im 1/1]
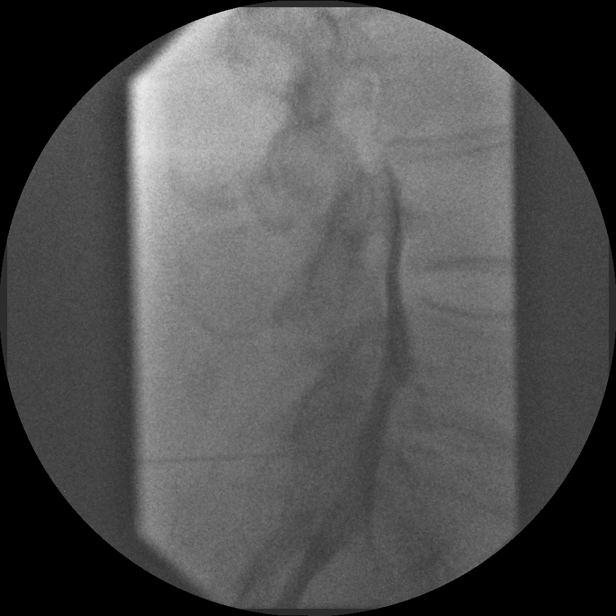

[Series 2: (hospital) · 1 of 1 slices shown (2 of 2)]
[im 1/1]
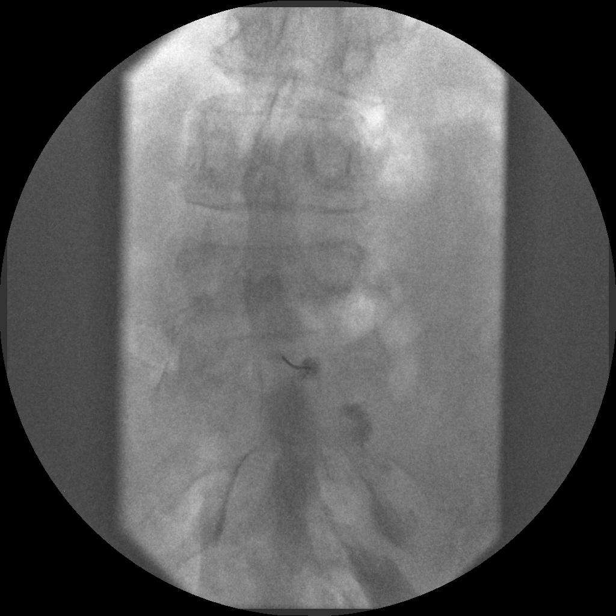

[Series 4: myelogram  white · 1 of 1 slices shown (1 of 9)]
[im 1/1]
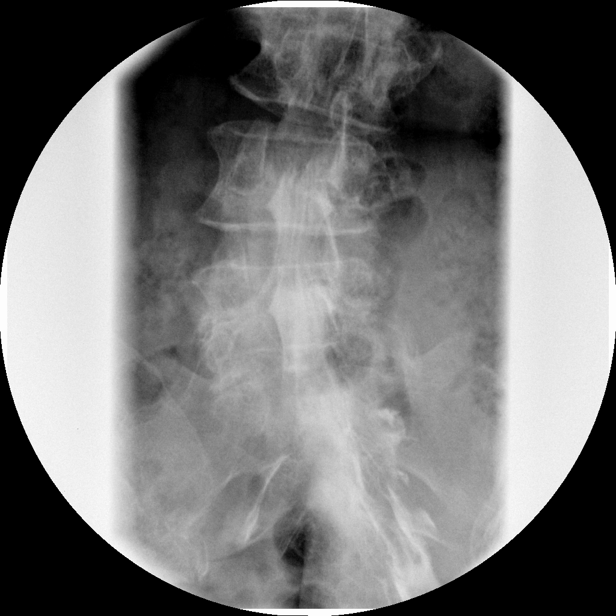

[Series 5: myelogram  white · 1 of 1 slices shown (2 of 9)]
[im 1/1]
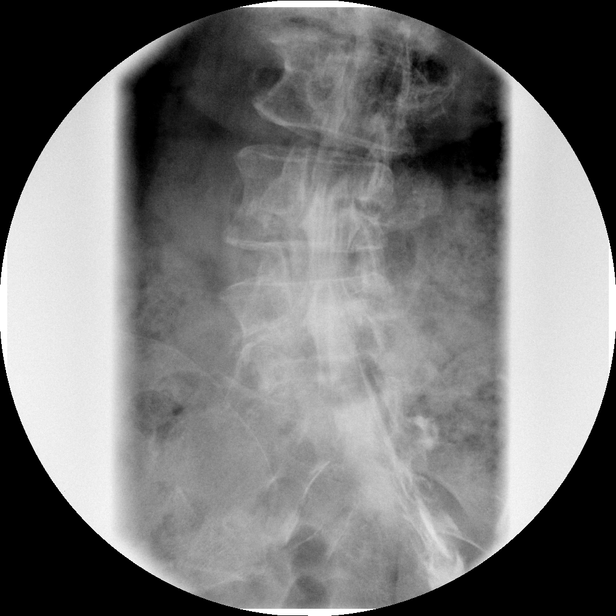

[Series 6: myelogram  white · 1 of 1 slices shown (3 of 9)]
[im 1/1]
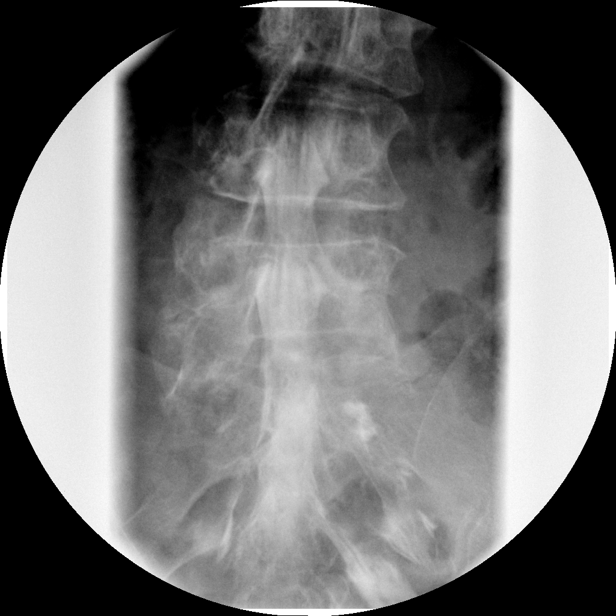

[Series 8: myelogram  white · 1 of 1 slices shown (4 of 9)]
[im 1/1]
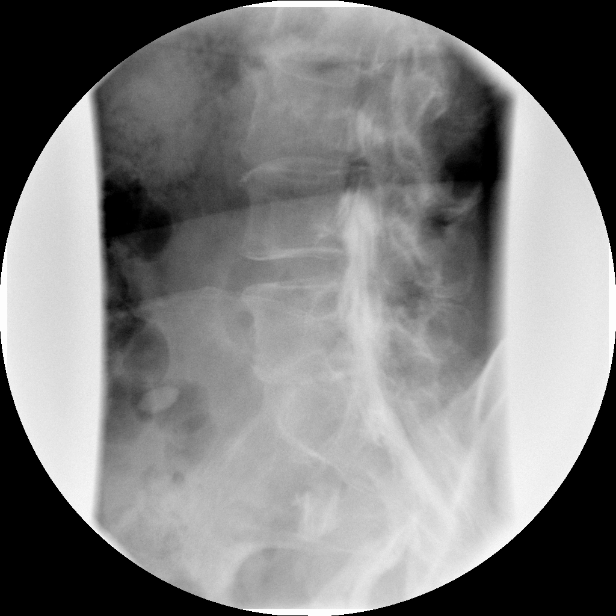

[Series 9: myelogram  white · 1 of 1 slices shown (5 of 9)]
[im 1/1]
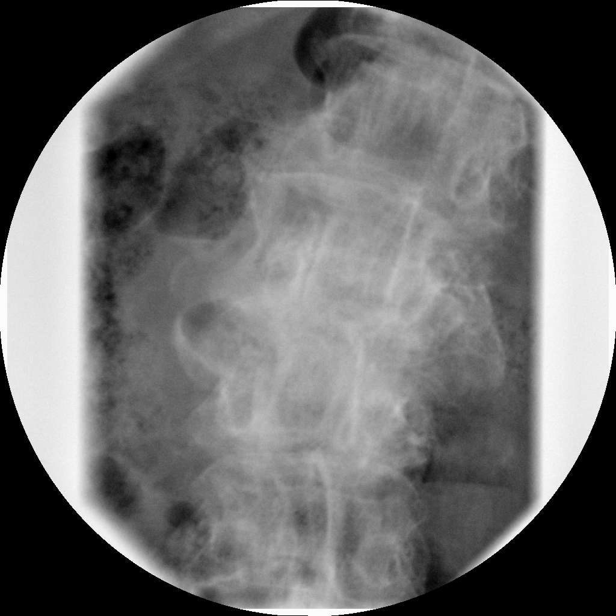

[Series 10: myelogram  white · 1 of 1 slices shown (6 of 9)]
[im 1/1]
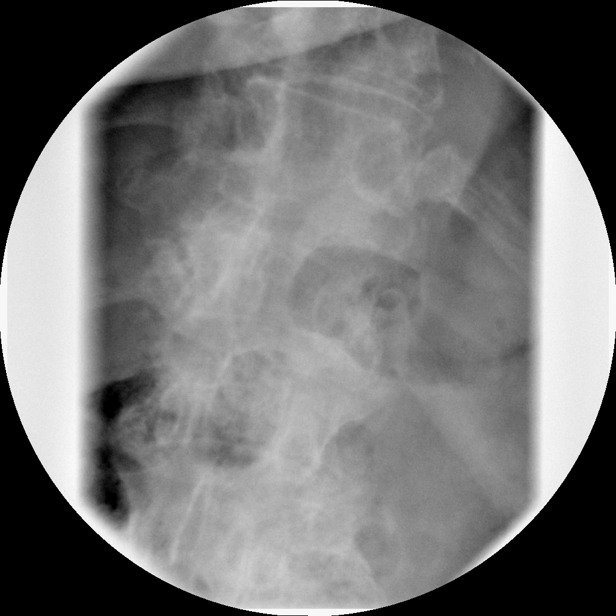

[Series 12: myelogram  white · 1 of 1 slices shown (7 of 9)]
[im 1/1]
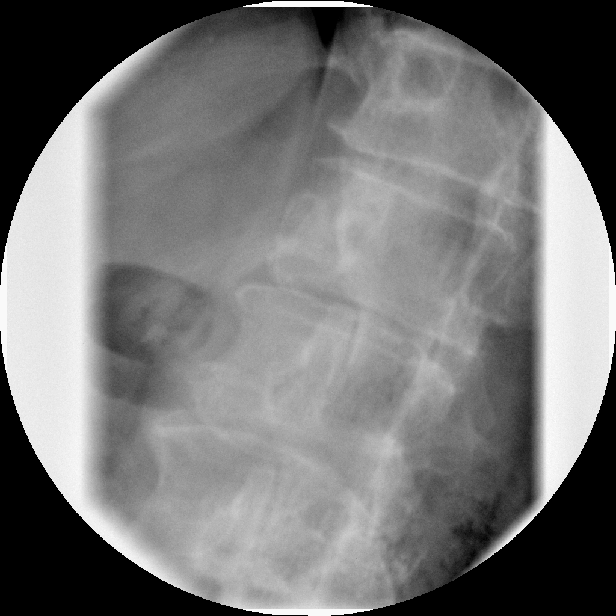

[Series 13: myelogram  white · 1 of 1 slices shown (8 of 9)]
[im 1/1]
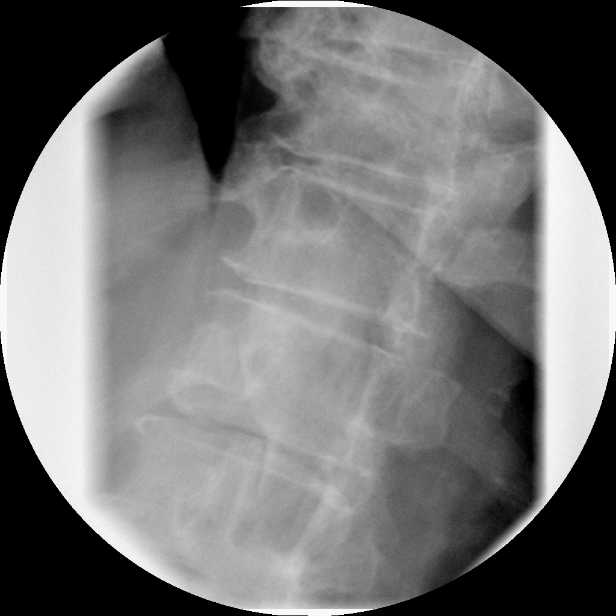

[Series 14: myelogram  white · 1 of 1 slices shown (9 of 9)]
[im 1/1]
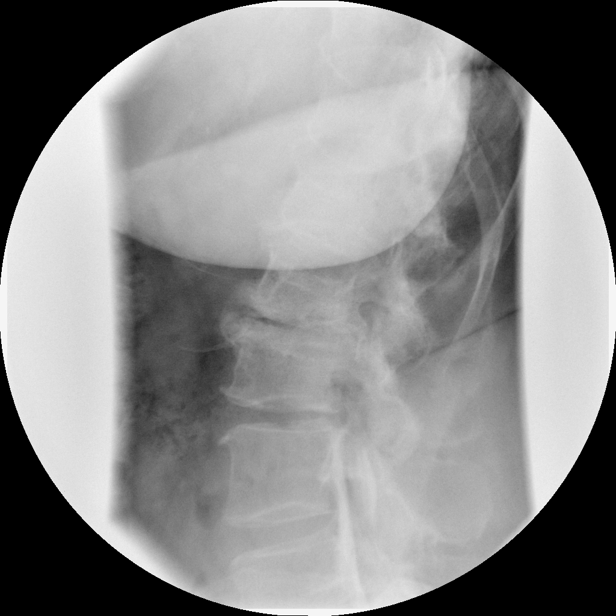

[Series 1002: view not recorded · 0.20mm/px · 1 of 1 slices shown (1 of 2)]
[im 1/1]
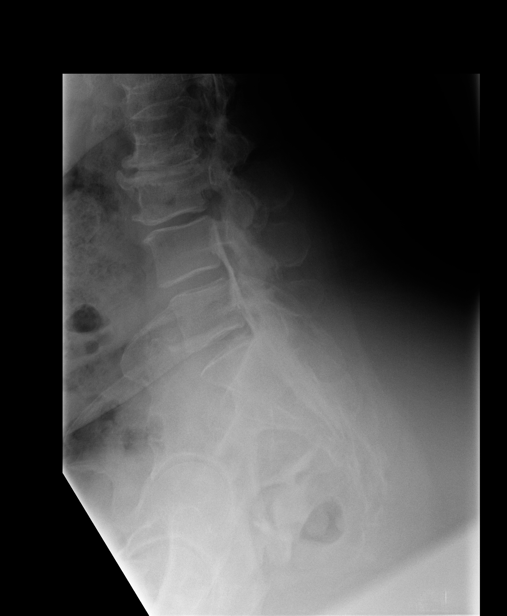

[Series 1003: view not recorded · 0.20mm/px · 1 of 1 slices shown (2 of 2)]
[im 1/1]
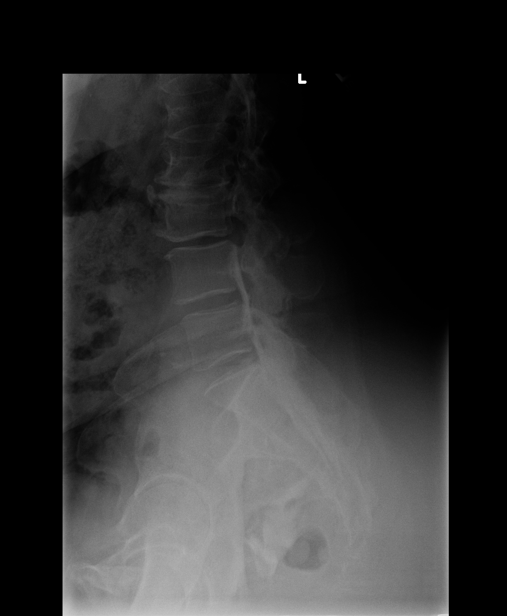

[13 of 17 positions shown; findings below may reference images not displayed]

EXAM:
LUMBAR MYELOGRAM

FLUOROSCOPY TIME:  3 min 28 seconds

PROCEDURE:
After thorough discussion of risks and benefits of the procedure
including bleeding, infection, injury to nerves, blood vessels,
adjacent structures as well as headache and CSF leak, written and
oral informed consent was obtained. Consent was obtained by Dr.
AUJLA. Time out form was completed.

Patient was positioned prone on the fluoroscopy table. Local
anesthesia was provided with 1% lidocaine without epinephrine after
prepped and draped in the usual sterile fashion. Puncture was
performed at L5-S1 using a 3 1/2 inch 22-gauge spinal needle via
right paramedian approach. Using a single pass through the dura, the
needle was placed within the thecal sac, with return of clear CSF.
15 mL of [L5] was injected into the thecal sac, with normal
opacification of the nerve roots and cauda equina consistent with
free flow within the subarachnoid space.

I personally performed the lumbar puncture and administered the
intrathecal contrast. I also personally supervised acquisition of
the myelogram images.
FINDINGS: LUMBAR MYELOGRAM FINDINGS:

The contrast injection is somewhat mixed. There is minimal contrast
in the subdural space. The lower lumbar nerve roots fill normally on
both sides. There is a significant block contrast at L3-4. Rightward
curvature of the lumbar spine is centered at L3-4. There is chronic
loss of disc height and moderate endplate degenerative changed L2-3
is previously noted.

The upright images demonstrate stable alignment. There is no
abnormal motion through a limited range of flexion and extension.

CT LUMBAR MYELOGRAM FINDINGS:

The lumbar spine is imaged from T11-12 through S3. Slight
retrolisthesis is evident at L2-3. AP alignment is otherwise
anatomic. Rightward curvature lumbar spine is centered at L2-3.
There is a vacuum disc at T12-L1, L1-2, and L2-3.

Contrast is present within the renal collecting systems bilaterally.
This could be related to a recent IV exam or absorption of contrast
by the ventral epidural veins.

Limited imaging of the abdomen is otherwise unremarkable. No
significant adenopathy is present.

T12-L1: A leftward disc protrusion and left greater than right facet
hypertrophy contribute to moderate left foraminal stenosis. Mild
left subarticular narrowing is present as well.

L1-2: A leftward disc protrusion is present. Mild facet hypertrophy
is evident. Mild left foraminal stenosis is evident.

L2-3: A leftward disc protrusion and asymmetric left-sided facet
hypertrophy results and moderate left subarticular and foraminal
stenosis. Mild right foraminal narrowing is stable.

L3-4: A broad-based disc protrusion is present. Advanced facet
hypertrophy and ligamentum flavum thickening results in moderate
central canal stenosis with right greater than left subarticular
narrowing. Mild to moderate foraminal narrowing is slightly worse on
the right.

L4-5: Advanced facet hypertrophy is present. A mild broad-based
protrusion is evident. There is mild left subarticular and bilateral
foraminal stenosis.

L5-S1: Moderate left-sided facet hypertrophy is present. Mild
foraminal narrowing is worse on the left.
IMPRESSION: LUMBAR MYELOGRAM IMPRESSION:

1. Moderate central canal stenosis at L3-4 with near total block
contrast.
2. Moderate central canal stenosis L2-3.
3. No abnormal motion with flexion or extension.

CT LUMBAR MYELOGRAM IMPRESSION:

1. Mild left subarticular and moderate left foraminal stenosis at
T12-L1.
2. Mild left foraminal narrowing at L1-2.
3. Moderate left subarticular and foraminal stenosis at L2-3.
4. Mild right foraminal narrowing at L2-3.
5. Moderate central canal stenosis with right greater than left
subarticular stenosis and mild to moderate foraminal narrowing at
L3-4.
6. Mild left subarticular and bilateral foraminal stenosis at L4-5.
7. Mild foraminal narrowing at L5-S1 is worse on the left.

## 2014-01-17 IMAGING — CT CT L SPINE W/ CM
4 of 11 series · 11 of 33 positions shown, 13 images · non-contrast
Comparison: MRI lumbar spine [DATE]

CLINICAL DATA: Low back pain extending to the right hip and groin.
Pain into the anterior thigh. Weakness in the right lower extremity.
TECHNIQUE: Contiguous axial images were obtained through the Lumbar spine after
the intrathecal infusion of infusion. Coronal and sagittal
reconstructions were obtained of the axial image sets.

[Series 2: l spine bone · axial · 0.27mm/px · z∈[-281,-51]mm · 3 of 93 slices shown, 4 images]
[im 1/93  soft-tissue]
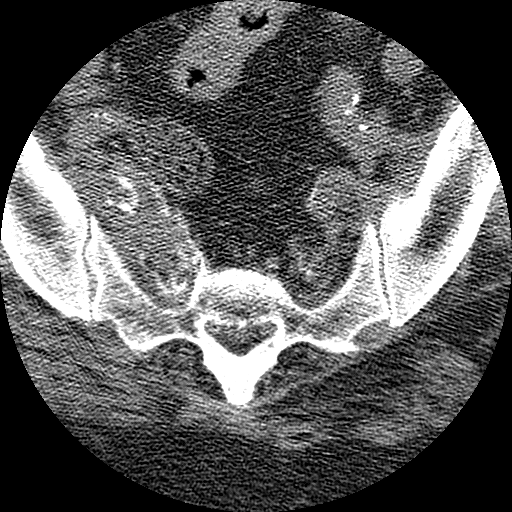
[im 1/93  bone]
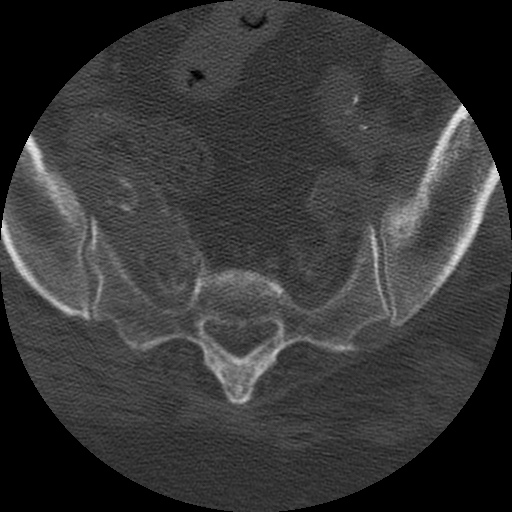
[im 47/93  bone]
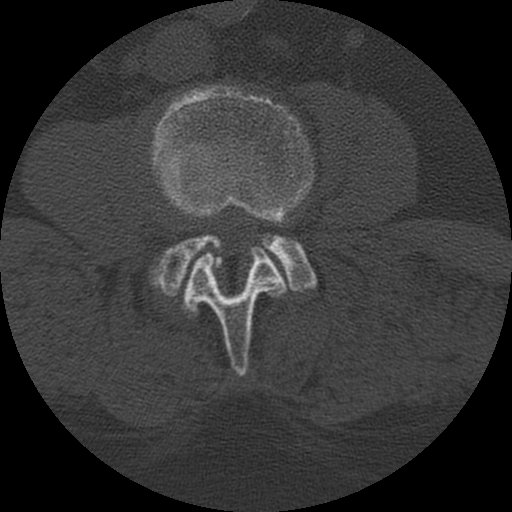
[im 93/93  bone]
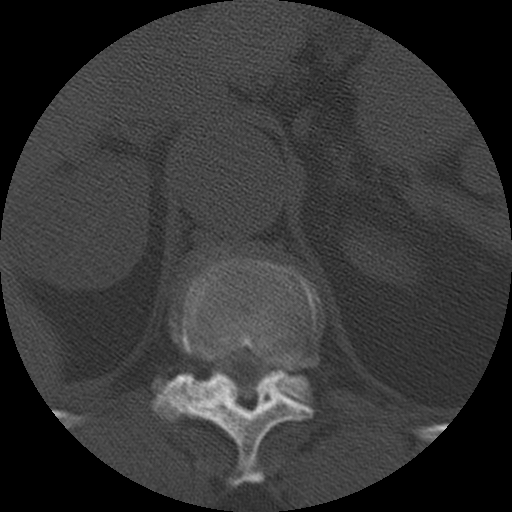

[Series 3: l spine soft · axial · 0.27mm/px · z∈[-206,-129]mm · 2 of 93 slices shown]
[im 31/93  soft-tissue]
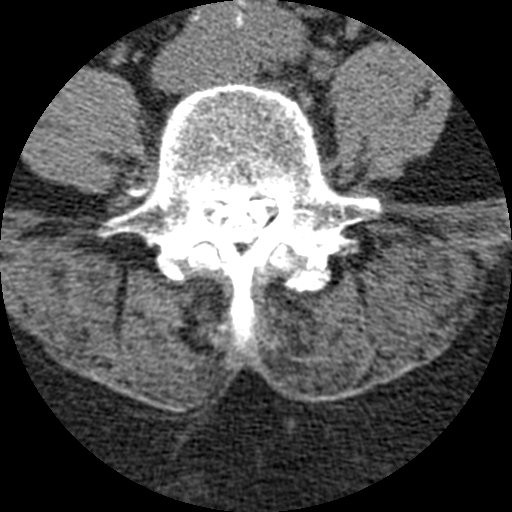
[im 62/93  soft-tissue]
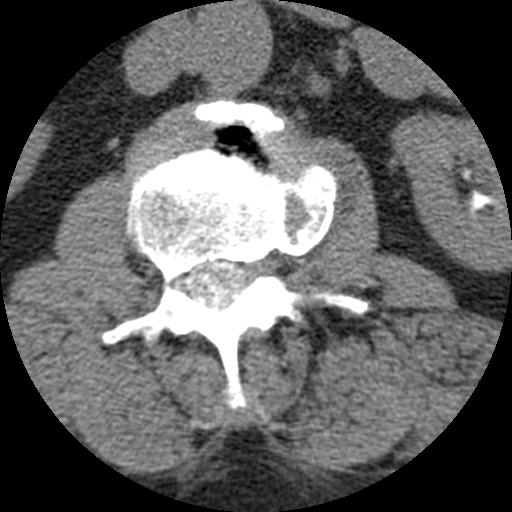

[Series 400: cor upper · coronal · 0.46mm/px · 1 of 47 slices shown]
[im 24/47  bone]
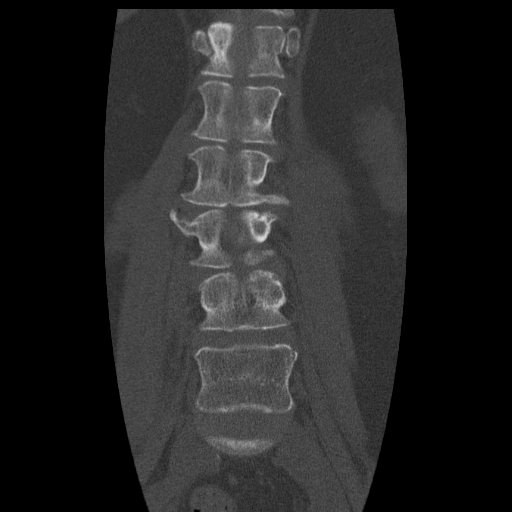

[Series 403: sag · sagittal · 0.46mm/px · 5 of 54 slices shown, 6 images]
[im 18/54  bone]
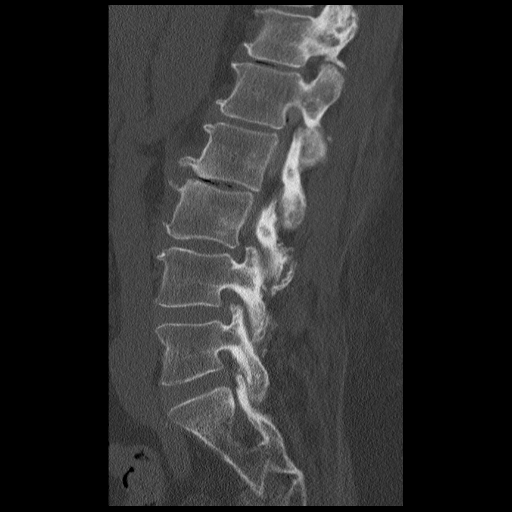
[im 23/54  bone]
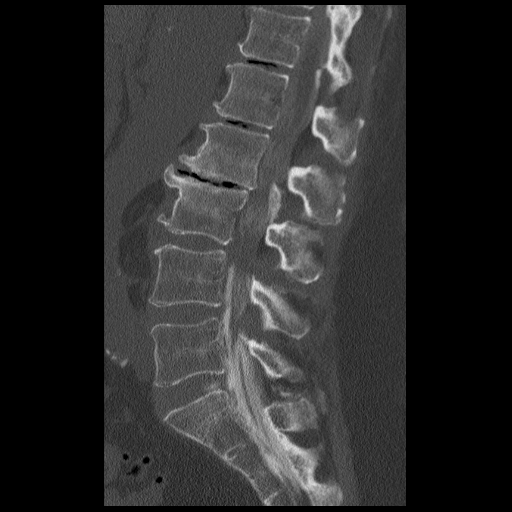
[im 27/54  soft-tissue]
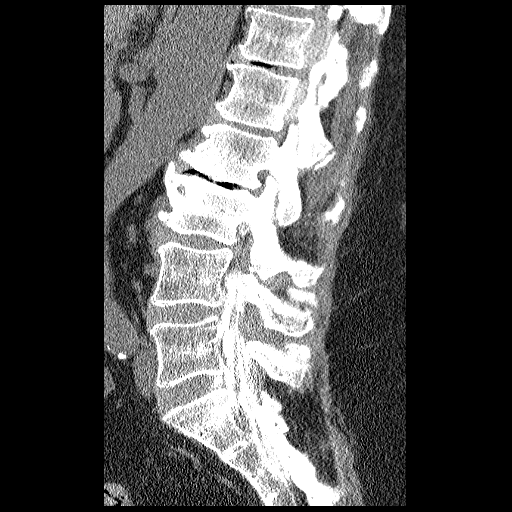
[im 27/54  bone]
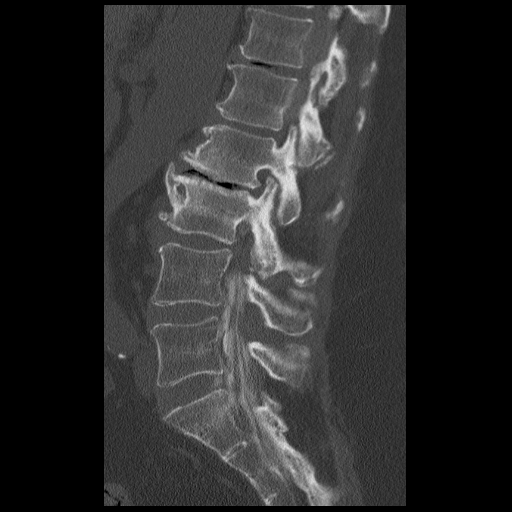
[im 31/54  bone]
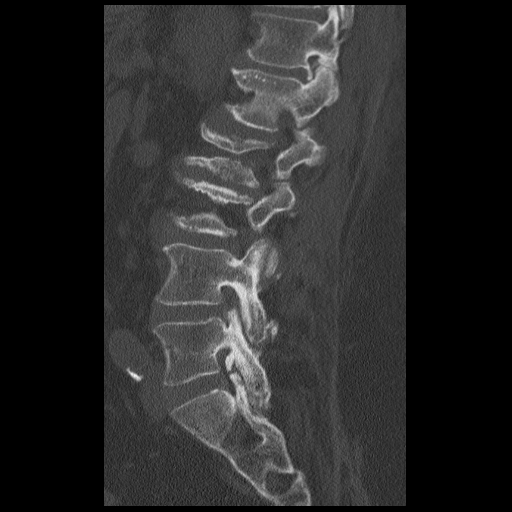
[im 36/54  bone]
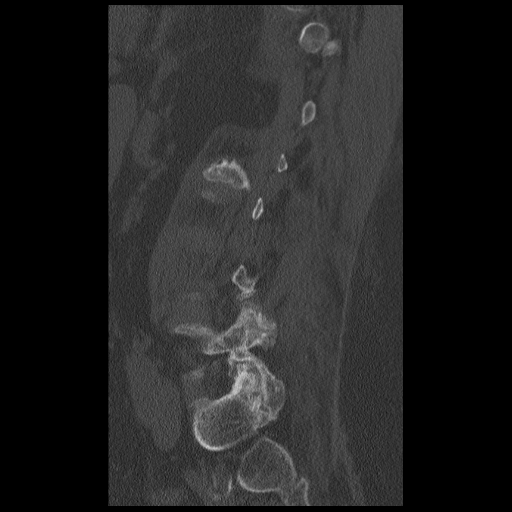

[11 of 33 positions shown; findings below may reference images not displayed]

EXAM:
LUMBAR MYELOGRAM

FLUOROSCOPY TIME:  3 min 28 seconds

PROCEDURE:
After thorough discussion of risks and benefits of the procedure
including bleeding, infection, injury to nerves, blood vessels,
adjacent structures as well as headache and CSF leak, written and
oral informed consent was obtained. Consent was obtained by Dr.
AUJLA. Time out form was completed.

Patient was positioned prone on the fluoroscopy table. Local
anesthesia was provided with 1% lidocaine without epinephrine after
prepped and draped in the usual sterile fashion. Puncture was
performed at L5-S1 using a 3 1/2 inch 22-gauge spinal needle via
right paramedian approach. Using a single pass through the dura, the
needle was placed within the thecal sac, with return of clear CSF.
15 mL of [L5] was injected into the thecal sac, with normal
opacification of the nerve roots and cauda equina consistent with
free flow within the subarachnoid space.

I personally performed the lumbar puncture and administered the
intrathecal contrast. I also personally supervised acquisition of
the myelogram images.
FINDINGS: LUMBAR MYELOGRAM FINDINGS:

The contrast injection is somewhat mixed. There is minimal contrast
in the subdural space. The lower lumbar nerve roots fill normally on
both sides. There is a significant block contrast at L3-4. Rightward
curvature of the lumbar spine is centered at L3-4. There is chronic
loss of disc height and moderate endplate degenerative changed L2-3
is previously noted.

The upright images demonstrate stable alignment. There is no
abnormal motion through a limited range of flexion and extension.

CT LUMBAR MYELOGRAM FINDINGS:

The lumbar spine is imaged from T11-12 through S3. Slight
retrolisthesis is evident at L2-3. AP alignment is otherwise
anatomic. Rightward curvature lumbar spine is centered at L2-3.
There is a vacuum disc at T12-L1, L1-2, and L2-3.

Contrast is present within the renal collecting systems bilaterally.
This could be related to a recent IV exam or absorption of contrast
by the ventral epidural veins.

Limited imaging of the abdomen is otherwise unremarkable. No
significant adenopathy is present.

T12-L1: A leftward disc protrusion and left greater than right facet
hypertrophy contribute to moderate left foraminal stenosis. Mild
left subarticular narrowing is present as well.

L1-2: A leftward disc protrusion is present. Mild facet hypertrophy
is evident. Mild left foraminal stenosis is evident.

L2-3: A leftward disc protrusion and asymmetric left-sided facet
hypertrophy results and moderate left subarticular and foraminal
stenosis. Mild right foraminal narrowing is stable.

L3-4: A broad-based disc protrusion is present. Advanced facet
hypertrophy and ligamentum flavum thickening results in moderate
central canal stenosis with right greater than left subarticular
narrowing. Mild to moderate foraminal narrowing is slightly worse on
the right.

L4-5: Advanced facet hypertrophy is present. A mild broad-based
protrusion is evident. There is mild left subarticular and bilateral
foraminal stenosis.

L5-S1: Moderate left-sided facet hypertrophy is present. Mild
foraminal narrowing is worse on the left.
IMPRESSION: LUMBAR MYELOGRAM IMPRESSION:

1. Moderate central canal stenosis at L3-4 with near total block
contrast.
2. Moderate central canal stenosis L2-3.
3. No abnormal motion with flexion or extension.

CT LUMBAR MYELOGRAM IMPRESSION:

1. Mild left subarticular and moderate left foraminal stenosis at
T12-L1.
2. Mild left foraminal narrowing at L1-2.
3. Moderate left subarticular and foraminal stenosis at L2-3.
4. Mild right foraminal narrowing at L2-3.
5. Moderate central canal stenosis with right greater than left
subarticular stenosis and mild to moderate foraminal narrowing at
L3-4.
6. Mild left subarticular and bilateral foraminal stenosis at L4-5.
7. Mild foraminal narrowing at L5-S1 is worse on the left.

## 2014-01-17 MED ORDER — MEPERIDINE HCL 100 MG/ML IJ SOLN
100.0000 mg | Freq: Once | INTRAMUSCULAR | Status: AC
  Administered 2014-01-17: 100 mg via INTRAMUSCULAR

## 2014-01-17 MED ORDER — IOHEXOL 180 MG/ML  SOLN
20.0000 mL | Freq: Once | INTRAMUSCULAR | Status: AC | PRN
  Administered 2014-01-17: 20 mL via INTRATHECAL

## 2014-01-17 MED ORDER — ONDANSETRON HCL 4 MG/2ML IJ SOLN
4.0000 mg | Freq: Once | INTRAMUSCULAR | Status: AC
  Administered 2014-01-17: 4 mg via INTRAMUSCULAR

## 2014-01-17 MED ORDER — DIAZEPAM 5 MG PO TABS
5.0000 mg | ORAL_TABLET | Freq: Once | ORAL | Status: AC
  Administered 2014-01-17: 5 mg via ORAL

## 2014-01-17 NOTE — Progress Notes (Signed)
Patient states she has been off Paxil and Phenergan for at least the past two days.  jkl

## 2014-01-17 NOTE — Discharge Instructions (Signed)
Myelogram Discharge Instructions  1. Go home and rest quietly for the next 24 hours.  It is important to lie flat for the next 24 hours.  Get up only to go to the restroom.  You may lie in the bed or on a couch on your back, your stomach, your left side or your right side.  You may have one pillow under your head.  You may have pillows between your knees while you are on your side or under your knees while you are on your back.  2. DO NOT drive today.  Recline the seat as far back as it will go, while still wearing your seat belt, on the way home.  3. You may get up to go to the bathroom as needed.  You may sit up for 10 minutes to eat.  You may resume your normal diet and medications unless otherwise indicated.  Drink lots of extra fluids today and tomorrow.  4. The incidence of headache, nausea, or vomiting is about 5% (one in 20 patients).  If you develop a headache, lie flat and drink plenty of fluids until the headache goes away.  Caffeinated beverages may be helpful.  If you develop severe nausea and vomiting or a headache that does not go away with flat bed rest, call [phone].  5. You may resume normal activities after your 24 hours of bed rest is over; however, do not exert yourself strongly or do any heavy lifting tomorrow. If when you get up you have a headache when standing, go back to bed and force fluids for another 24 hours.  6. Call your physician for a follow-up appointment.  The results of your myelogram will be sent directly to your physician by the following day.  7. If you have any questions or if complications develop after you arrive home, please call [phone].  Discharge instructions have been explained to the patient.  The patient, or the person responsible for the patient, fully understands these instructions.      May resume Paxil and Promethazine on Nov. 26, 2015, after 11:00 am.

## 2014-01-29 ENCOUNTER — Other Ambulatory Visit: Payer: Self-pay | Admitting: Neurological Surgery

## 2014-02-07 ENCOUNTER — Ambulatory Visit (HOSPITAL_COMMUNITY)
Admission: RE | Admit: 2014-02-07 | Discharge: 2014-02-07 | Disposition: A | Discharge status: Home / Self Care | Payer: Medicare Other | Source: Ambulatory Visit | Attending: Neurological Surgery | Admitting: Neurological Surgery

## 2014-02-07 ENCOUNTER — Encounter (HOSPITAL_COMMUNITY)
Admission: RE | Admit: 2014-02-07 | Discharge: 2014-02-07 | Disposition: A | Discharge status: Home / Self Care | Payer: Medicare Other | Source: Ambulatory Visit | Attending: Neurological Surgery | Admitting: Neurological Surgery

## 2014-02-07 ENCOUNTER — Encounter (HOSPITAL_COMMUNITY): Payer: Self-pay

## 2014-02-07 DIAGNOSIS — I252 Old myocardial infarction: Secondary | ICD-10-CM | POA: Insufficient documentation

## 2014-02-07 DIAGNOSIS — R011 Cardiac murmur, unspecified: Secondary | ICD-10-CM | POA: Insufficient documentation

## 2014-02-07 DIAGNOSIS — I444 Left anterior fascicular block: Secondary | ICD-10-CM | POA: Diagnosis not present

## 2014-02-07 DIAGNOSIS — M48061 Spinal stenosis, lumbar region without neurogenic claudication: Secondary | ICD-10-CM

## 2014-02-07 DIAGNOSIS — I517 Cardiomegaly: Secondary | ICD-10-CM | POA: Insufficient documentation

## 2014-02-07 DIAGNOSIS — Z01818 Encounter for other preprocedural examination: Secondary | ICD-10-CM | POA: Diagnosis not present

## 2014-02-07 HISTORY — DX: Reserved for inherently not codable concepts without codable children: IMO0001

## 2014-02-07 HISTORY — DX: Cardiac murmur, unspecified: R01.1

## 2014-02-07 LAB — BASIC METABOLIC PANEL
Anion gap: 13 (ref 5–15)
BUN: 16 mg/dL (ref 6–23)
CO2: 28 mEq/L (ref 19–32)
CREATININE: 1.01 mg/dL (ref 0.50–1.10)
Calcium: 9.8 mg/dL (ref 8.4–10.5)
Chloride: 99 mEq/L (ref 96–112)
GFR calc non Af Amer: 56 mL/min — ABNORMAL LOW (ref >90)
GFR, EST AFRICAN AMERICAN: 65 mL/min — AB (ref >90)
GLUCOSE: 77 mg/dL (ref 70–99)
POTASSIUM: 3.7 meq/L (ref 3.7–5.3)
Sodium: 140 mEq/L (ref 137–147)

## 2014-02-07 LAB — CBC WITH DIFFERENTIAL/PLATELET
Basophils Absolute: 0 10*3/uL (ref 0.0–0.1)
Basophils Relative: 0 % (ref 0–1)
EOS ABS: 0.3 10*3/uL (ref 0.0–0.7)
EOS PCT: 2 % (ref 0–5)
HCT: 38.4 % (ref 36.0–46.0)
HEMOGLOBIN: 12.3 g/dL (ref 12.0–15.0)
Lymphocytes Relative: 26 % (ref 12–46)
Lymphs Abs: 2.8 10*3/uL (ref 0.7–4.0)
MCH: 29.1 pg (ref 26.0–34.0)
MCHC: 32 g/dL (ref 30.0–36.0)
MCV: 90.8 fL (ref 78.0–100.0)
MONOS PCT: 10 % (ref 3–12)
Monocytes Absolute: 1.1 10*3/uL — ABNORMAL HIGH (ref 0.1–1.0)
Neutro Abs: 6.7 10*3/uL (ref 1.7–7.7)
Neutrophils Relative %: 62 % (ref 43–77)
Platelets: 556 10*3/uL — ABNORMAL HIGH (ref 150–400)
RBC: 4.23 MIL/uL (ref 3.87–5.11)
RDW: 16 % — ABNORMAL HIGH (ref 11.5–15.5)
WBC: 10.9 10*3/uL — ABNORMAL HIGH (ref 4.0–10.5)

## 2014-02-07 LAB — SURGICAL PCR SCREEN
MRSA, PCR: NEGATIVE
Staphylococcus aureus: NEGATIVE

## 2014-02-07 LAB — PROTIME-INR
INR: 0.97 (ref 0.00–1.49)
Prothrombin Time: 13 seconds (ref 11.6–15.2)

## 2014-02-07 IMAGING — CR DG CHEST 2V
2 series · 2 of 2 positions shown · non-contrast
Comparison: [DATE]

CLINICAL DATA: Preoperative exam.  Back surgery.

EXAM:
CHEST  2 VIEW

[w chest pa]
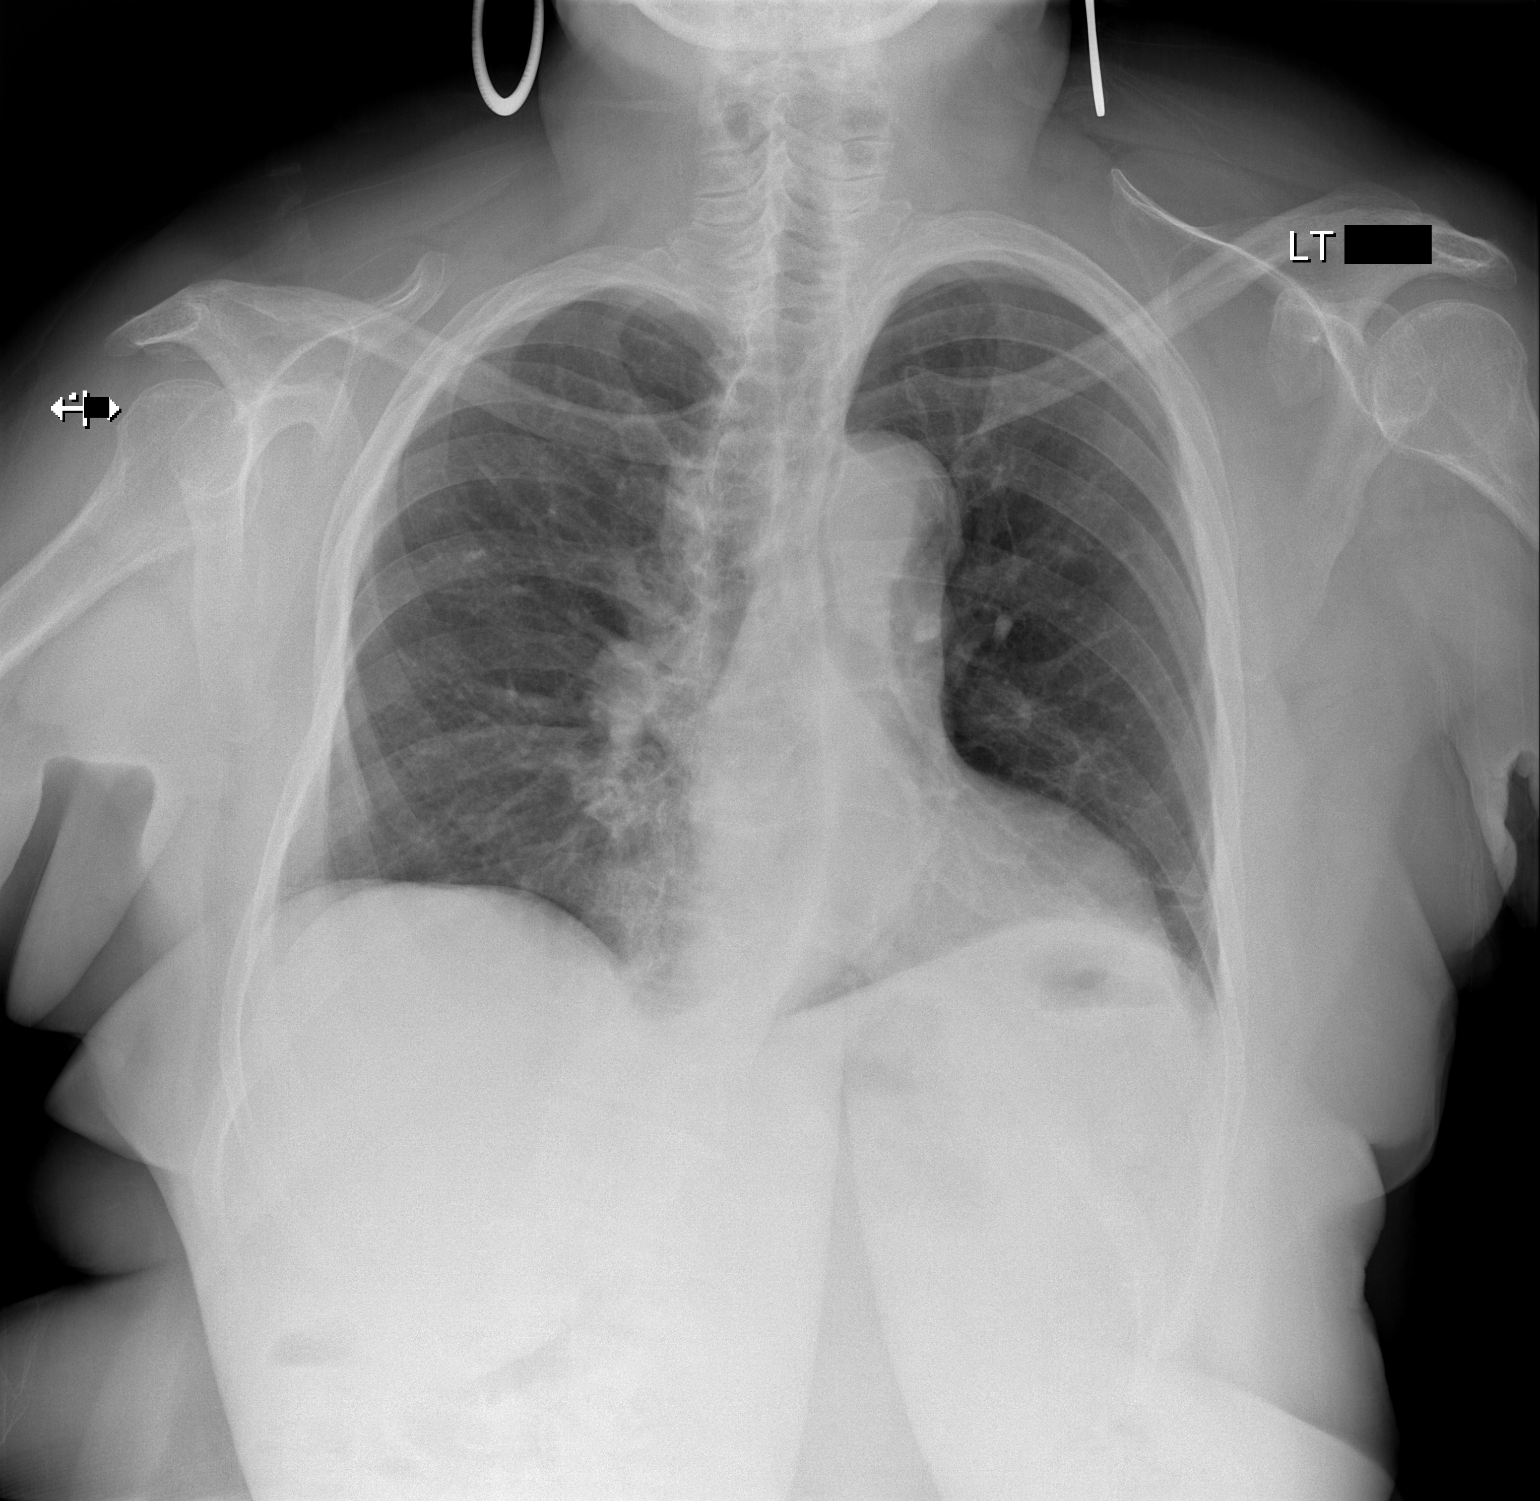

[w chest lat]
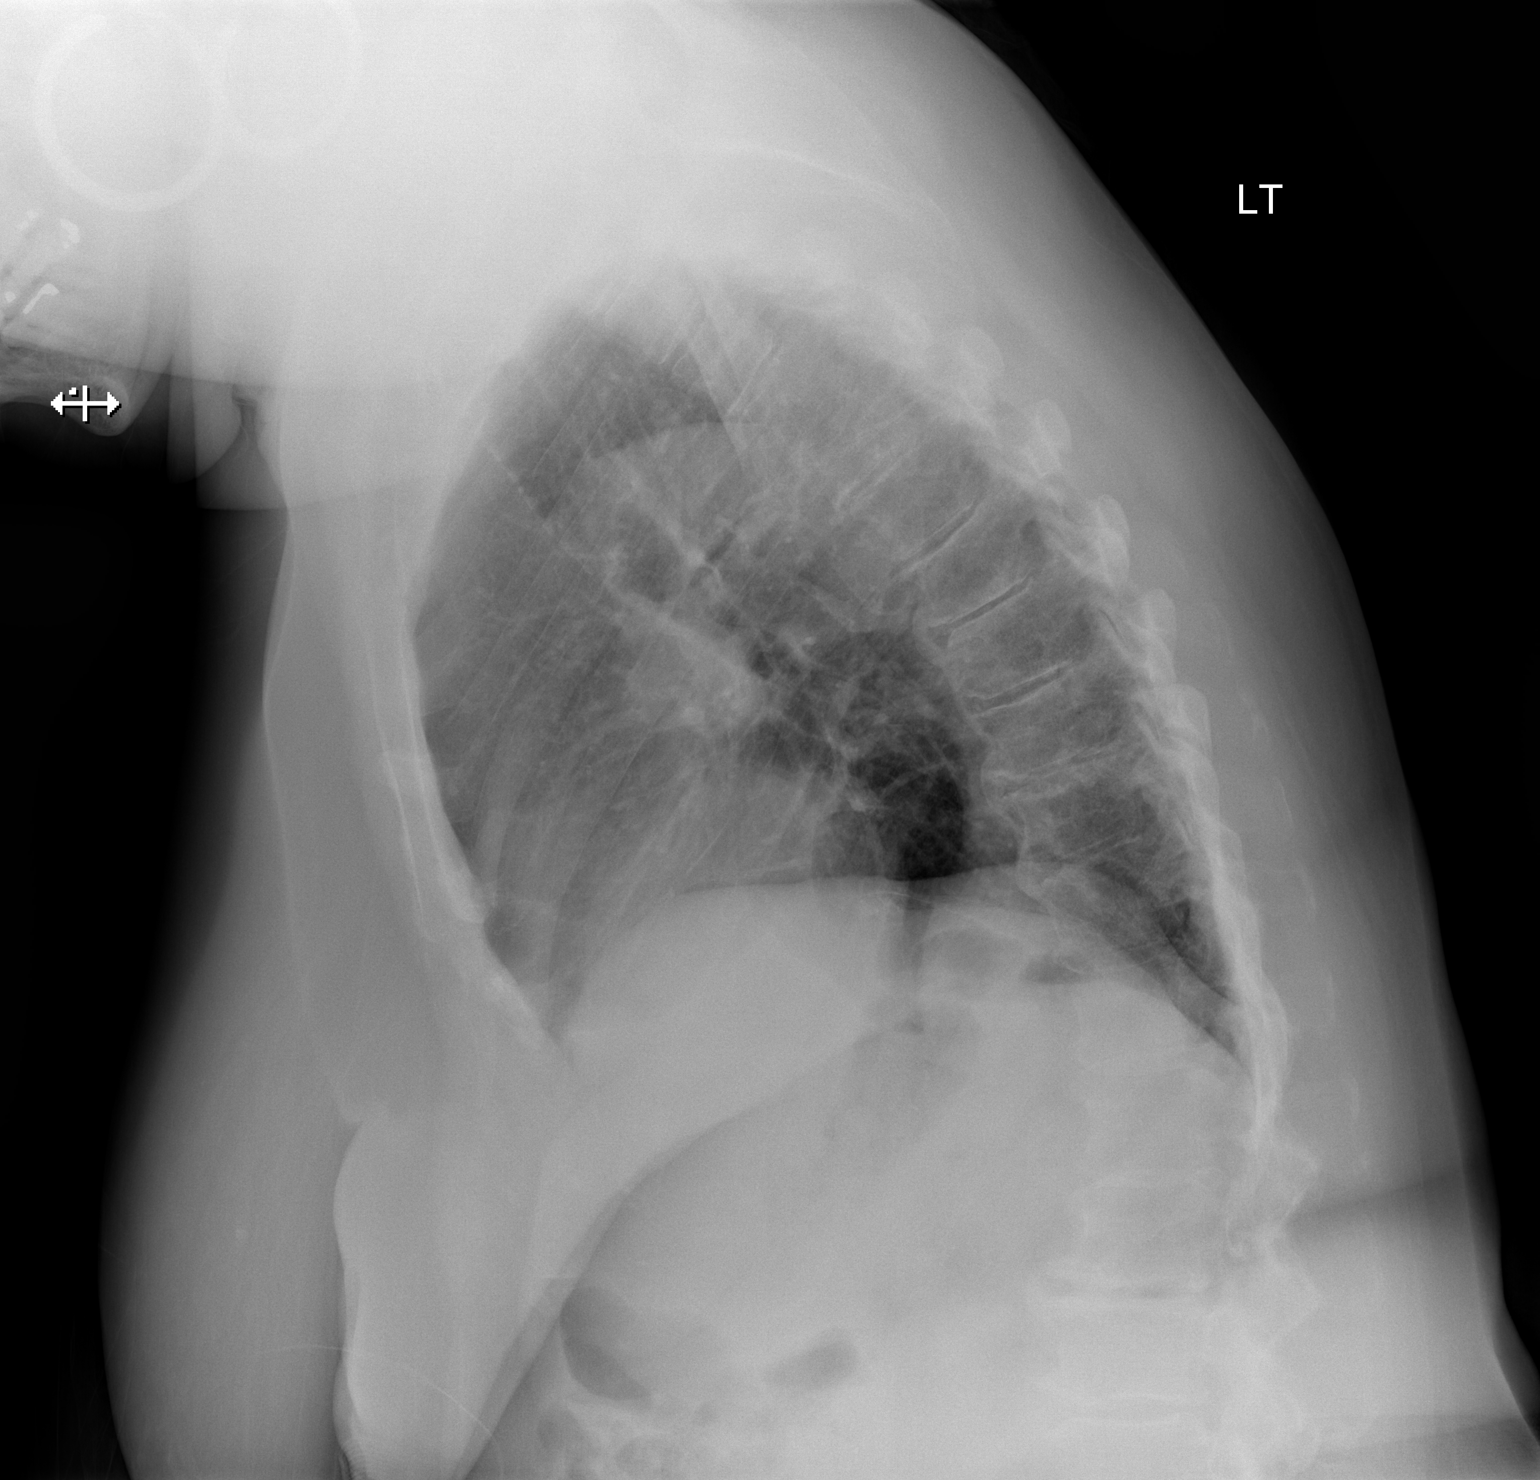

[2 of 2 positions shown; findings below may reference images not displayed]

FINDINGS: The heart size and mediastinal contours are within normal limits.
Both lungs are clear. The visualized skeletal structures are
unremarkable.
IMPRESSION: No active cardiopulmonary disease.

## 2014-02-07 NOTE — Pre-Procedure Instructions (Signed)
Mallory Klein  02/07/2014   Your procedure is scheduled on:  Tuesday, Dec. 29  Report to [facility] Main Entrance "A" at 10:30 AM.  Call this number if you have problems the morning of surgery: [phone]               If any problems prior to surgery call pre-admission [phone]   Remember:   Do not eat food or drink liquids after midnight.   Take these medicines the morning of surgery with A SIP OF WATER: xanax if needed,  Dexilant, fluconazole, advair-bring to hospital, oxycodone or tramadol  if needed, methocarbamol (robaxin) if needed,   Do not wear jewelry, make-up or nail polish.  Do not wear lotions, powders, or perfumes. You may wear deodorant.  Do not shave 48 hours prior to surgery. Men may shave face and neck.  Do not bring valuables to the hospital.  [facility] is not responsible                  for any belongings or valuables.               Contacts, dentures or bridgework may not be worn into surgery.  Leave suitcase in the car. After surgery it may be brought to your room.  For patients admitted to the hospital, discharge time is determined by your   treatment team.               Patients discharged the day of surgery will not be allowed to drive  home.  Name and phone number of your driver:   Special Instructions: review preparing for surgery handout   Please read over the following fact sheets that you were given: Pain Booklet, Coughing and Deep Breathing, MRSA Information and Surgical Site Infection Prevention

## 2014-02-07 NOTE — Progress Notes (Signed)
PCP: Dr. Alain Marion No cardiologist  Pt. States she has a heart murmur and was dx. Yrs. Ago. States she can't remember if any test were done  Except for a stress 20 yrs. Ago. States Dr. Alain Marion can hardly hear the murmur and it was nothing for her to worry about. Information given to Albin Felling., PA. Stated she didn't need to see pt.

## 2014-02-08 NOTE — Progress Notes (Signed)
Anesthesia Chart Review:  Pt is 68 year old female scheduled for 2 level lumbar laminectomy, decompression microdiscectomy on 02/20/2014 with Dr. Ronnald Ramp.   PMH: HTN, heart murmur (unspecified), asthma, GERD. BMI 39  Preoperative labs reviewed.    Chest x-ray reviewed. No active cardiopulmonary disease.   EKG: NSR. Possible LA enlargement. LAFB. LVH. Anteroseptal infarct, age undetermined. Not significantly changed from previous tracing 08/06/2011.   Discussed with Dr. Therisa Doyne.   If no changes, I anticipate pt can proceed with surgery as scheduled.   Willeen Cass, [name]-BC [facility]/Anesthesiology Phone: [phone] 02/08/2014 2:10 PM

## 2014-02-09 ENCOUNTER — Ambulatory Visit (INDEPENDENT_AMBULATORY_CARE_PROVIDER_SITE_OTHER): Payer: Medicare Other | Admitting: Internal Medicine

## 2014-02-09 ENCOUNTER — Encounter: Payer: Self-pay | Admitting: Internal Medicine

## 2014-02-09 VITALS — BP 140/90 | HR 120 | Temp 98.5°F | Wt 203.0 lb

## 2014-02-09 DIAGNOSIS — G20A1 Parkinson's disease without dyskinesia, without mention of fluctuations: Secondary | ICD-10-CM | POA: Insufficient documentation

## 2014-02-09 DIAGNOSIS — M5441 Lumbago with sciatica, right side: Secondary | ICD-10-CM

## 2014-02-09 DIAGNOSIS — Z23 Encounter for immunization: Secondary | ICD-10-CM

## 2014-02-09 DIAGNOSIS — L84 Corns and callosities: Secondary | ICD-10-CM

## 2014-02-09 DIAGNOSIS — G20 Parkinson's disease: Secondary | ICD-10-CM | POA: Insufficient documentation

## 2014-02-09 DIAGNOSIS — G250 Essential tremor: Secondary | ICD-10-CM

## 2014-02-09 HISTORY — DX: Parkinson's disease without dyskinesia, without mention of fluctuations: G20.A1

## 2014-02-09 HISTORY — DX: Parkinson's disease: G20

## 2014-02-09 NOTE — Assessment & Plan Note (Signed)
Options to treat were discussed Xanax prn

## 2014-02-09 NOTE — Assessment & Plan Note (Signed)
Pt is planning to have a laminectomy/diskectomy on 12/29

## 2014-02-09 NOTE — Progress Notes (Signed)
Subjective:    HPI   F/u neck pain - better; f/u LBP. She saw Dr Ronnald Ramp - planning to have a laminectomy/diskectomy on 12/29 F/u bad hot flashes F/u on hot flashes and urinary incontinence Her dad is in a NH now  Her son died last year - 68 yo  The patient needs to address  chronic hypertension that has been well controlled with medicines; to address chronic  hyperlipidemia controlled with medicines as well; and to address type 2 chronic diabetes, controlled with medical treatment and diet.  R TKR Dr Magda Bernheim in [place] 2/13. She had a manipulation x2 - it did not work... In group PT.  LBP is better - had injections   BP Readings from Last 3 Encounters:  02/09/14 140/90  02/07/14 132/85  01/17/14 126/59   Wt Readings from Last 3 Encounters:  02/09/14 203 lb (92.08 kg)  02/07/14 209 lb (94.802 kg)  10/17/13 197 lb (89.359 kg)      Review of Systems  Constitutional: Negative.  Negative for chills, diaphoresis, activity change, appetite change, fatigue and unexpected weight change.  HENT: Negative for congestion, ear pain, facial swelling, hearing loss, mouth sores, nosebleeds, postnasal drip, rhinorrhea, sinus pressure, sneezing, sore throat and tinnitus.   Eyes: Negative for pain, discharge, redness, itching and visual disturbance.  Respiratory: Negative for cough, chest tightness, shortness of breath, wheezing and stridor.   Cardiovascular: Negative for palpitations and leg swelling.  Gastrointestinal: Negative for nausea, diarrhea, constipation, blood in stool, abdominal distention, anal bleeding and rectal pain.  Genitourinary: Negative for dysuria, urgency, frequency, hematuria, flank pain, vaginal bleeding, vaginal discharge, difficulty urinating, genital sores and pelvic pain.  Musculoskeletal: Positive for arthralgias and gait problem. Negative for back pain, joint swelling and neck stiffness.       R knee pain  Skin: Negative.  Negative for rash.  Neurological:  Negative for dizziness, tremors, seizures, syncope and speech difficulty.  Hematological: Negative for adenopathy. Does not bruise/bleed easily.  Psychiatric/Behavioral: Positive for sleep disturbance. Negative for suicidal ideas, behavioral problems, dysphoric mood and decreased concentration. The patient is not nervous/anxious.        Objective:   Physical Exam  Constitutional: She appears well-developed. No distress.  HENT:  Head: Normocephalic.  Right Ear: External ear normal.  Left Ear: External ear normal.  Nose: Nose normal.  Mouth/Throat: Oropharynx is clear and moist.  Eyes: Conjunctivae are normal. Pupils are equal, round, and reactive to light. Right eye exhibits no discharge. Left eye exhibits no discharge.  Neck: Normal range of motion. Neck supple. No JVD present. No tracheal deviation present. No thyromegaly present.  Cardiovascular: Normal rate, regular rhythm and normal heart sounds.   Pulmonary/Chest: No stridor. No respiratory distress. She has no wheezes.  Abdominal: Soft. Bowel sounds are normal. She exhibits no distension and no mass. There is no tenderness. There is no rebound and no guarding.  Musculoskeletal: She exhibits no edema or tenderness.  Lymphadenopathy:    She has no cervical adenopathy.  Neurological: She displays normal reflexes. No cranial nerve deficit. She exhibits normal muscle tone. Coordination normal.  Skin: No rash noted. No erythema.  Psychiatric: She has a normal mood and affect. Her behavior is normal. Judgment and thought content normal.  Deformed gait/cane L shoulder is 1 inch higher Neck ROM is ok, tender Cane LS is tender L elbow callus   Lab Results  Component Value Date   WBC 10.9* 02/07/2014   HGB 12.3 02/07/2014   HCT 38.4  02/07/2014   PLT 556* 02/07/2014   GLUCOSE 77 02/07/2014   CHOL 241* 08/21/2013   TRIG 60.0 08/21/2013   HDL 82.10 08/21/2013   LDLDIRECT 109.8 02/10/2010   LDLCALC 147* 08/21/2013   ALT 14  08/21/2013   AST 19 08/21/2013   NA 140 02/07/2014   K 3.7 02/07/2014   CL 99 02/07/2014   CREATININE 1.01 02/07/2014   BUN 16 02/07/2014   CO2 28 02/07/2014   TSH 0.70 08/21/2013   INR 0.97 02/07/2014   HGBA1C 6.1 08/21/2013    L elbow paring  Procedure:  L ELBOW CALLUS callus paring/cutting  Indication:   callus, painful  Consent: verbal  Risks and benefits were explained to the patient. Skin was cleaned with alcohol. I removed a large callus carefully with a round blade. Skin remained intact. Pain is better. Tolerated well. Complications: none. Bandaid applied        Assessment & Plan:

## 2014-02-09 NOTE — Progress Notes (Signed)
Pre visit review using our clinic review tool, if applicable. No additional management support is needed unless otherwise documented below in the visit note. 

## 2014-02-09 NOTE — Assessment & Plan Note (Signed)
Paring done

## 2014-02-19 MED ORDER — CEFAZOLIN SODIUM-DEXTROSE 2-3 GM-% IV SOLR
2.0000 g | INTRAVENOUS | Status: AC
  Administered 2014-02-20: 2 g via INTRAVENOUS
  Filled 2014-02-19: qty 50

## 2014-02-19 MED ORDER — DEXAMETHASONE SODIUM PHOSPHATE 10 MG/ML IJ SOLN
10.0000 mg | INTRAMUSCULAR | Status: AC
  Administered 2014-02-20: 10 mg via INTRAVENOUS
  Filled 2014-02-19: qty 1

## 2014-02-20 ENCOUNTER — Ambulatory Visit (HOSPITAL_COMMUNITY): Payer: Medicare Other | Admitting: Anesthesiology

## 2014-02-20 ENCOUNTER — Ambulatory Visit (HOSPITAL_COMMUNITY): Payer: Medicare Other | Admitting: Emergency Medicine

## 2014-02-20 ENCOUNTER — Encounter (HOSPITAL_COMMUNITY): Payer: Self-pay | Admitting: *Deleted

## 2014-02-20 ENCOUNTER — Ambulatory Visit (HOSPITAL_COMMUNITY)
Admission: RE | Admit: 2014-02-20 | Discharge: 2014-02-21 | Disposition: A | Discharge status: Home / Self Care | Payer: Medicare Other | Source: Ambulatory Visit | Attending: Neurological Surgery | Admitting: Neurological Surgery

## 2014-02-20 ENCOUNTER — Encounter (HOSPITAL_COMMUNITY)
Admission: RE | Disposition: A | Discharge status: Home / Self Care | Payer: Self-pay | Source: Ambulatory Visit | Attending: Neurological Surgery

## 2014-02-20 ENCOUNTER — Ambulatory Visit (HOSPITAL_COMMUNITY): Payer: Medicare Other

## 2014-02-20 DIAGNOSIS — F329 Major depressive disorder, single episode, unspecified: Secondary | ICD-10-CM | POA: Insufficient documentation

## 2014-02-20 DIAGNOSIS — G4730 Sleep apnea, unspecified: Secondary | ICD-10-CM | POA: Insufficient documentation

## 2014-02-20 DIAGNOSIS — K219 Gastro-esophageal reflux disease without esophagitis: Secondary | ICD-10-CM | POA: Diagnosis not present

## 2014-02-20 DIAGNOSIS — M4806 Spinal stenosis, lumbar region: Secondary | ICD-10-CM | POA: Diagnosis not present

## 2014-02-20 DIAGNOSIS — I10 Essential (primary) hypertension: Secondary | ICD-10-CM | POA: Diagnosis not present

## 2014-02-20 DIAGNOSIS — M48061 Spinal stenosis, lumbar region without neurogenic claudication: Secondary | ICD-10-CM

## 2014-02-20 DIAGNOSIS — J45909 Unspecified asthma, uncomplicated: Secondary | ICD-10-CM | POA: Diagnosis not present

## 2014-02-20 DIAGNOSIS — M1990 Unspecified osteoarthritis, unspecified site: Secondary | ICD-10-CM | POA: Diagnosis not present

## 2014-02-20 DIAGNOSIS — M5136 Other intervertebral disc degeneration, lumbar region: Secondary | ICD-10-CM | POA: Diagnosis not present

## 2014-02-20 DIAGNOSIS — Z888 Allergy status to other drugs, medicaments and biological substances status: Secondary | ICD-10-CM | POA: Diagnosis not present

## 2014-02-20 DIAGNOSIS — Z886 Allergy status to analgesic agent status: Secondary | ICD-10-CM | POA: Diagnosis not present

## 2014-02-20 DIAGNOSIS — R0600 Dyspnea, unspecified: Secondary | ICD-10-CM | POA: Insufficient documentation

## 2014-02-20 DIAGNOSIS — F419 Anxiety disorder, unspecified: Secondary | ICD-10-CM | POA: Diagnosis not present

## 2014-02-20 DIAGNOSIS — R011 Cardiac murmur, unspecified: Secondary | ICD-10-CM | POA: Insufficient documentation

## 2014-02-20 DIAGNOSIS — Z9889 Other specified postprocedural states: Secondary | ICD-10-CM

## 2014-02-20 HISTORY — PX: LUMBAR LAMINECTOMY/DECOMPRESSION MICRODISCECTOMY: SHX5026

## 2014-02-20 IMAGING — CR DG LUMBAR SPINE 2-3V
2 series · 2 of 2 positions shown · non-contrast
Comparison: CT from [DATE]

CLINICAL DATA: 68-year-old for a lumbar laminectomy

EXAM:
LUMBAR SPINE - 2-3 VIEW

[lat (1 of 2)]
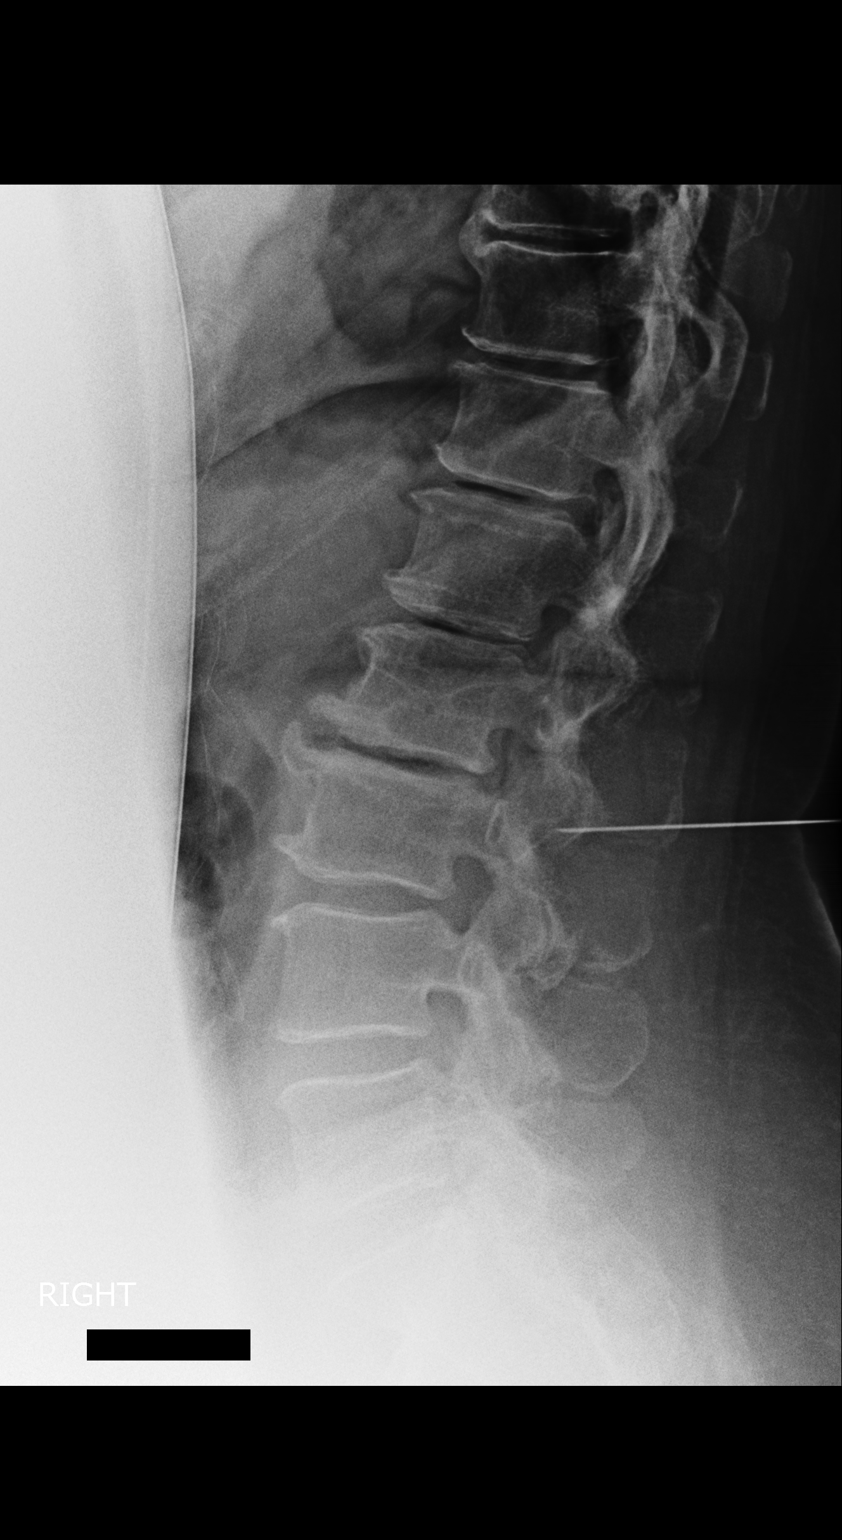

[lat (2 of 2)]
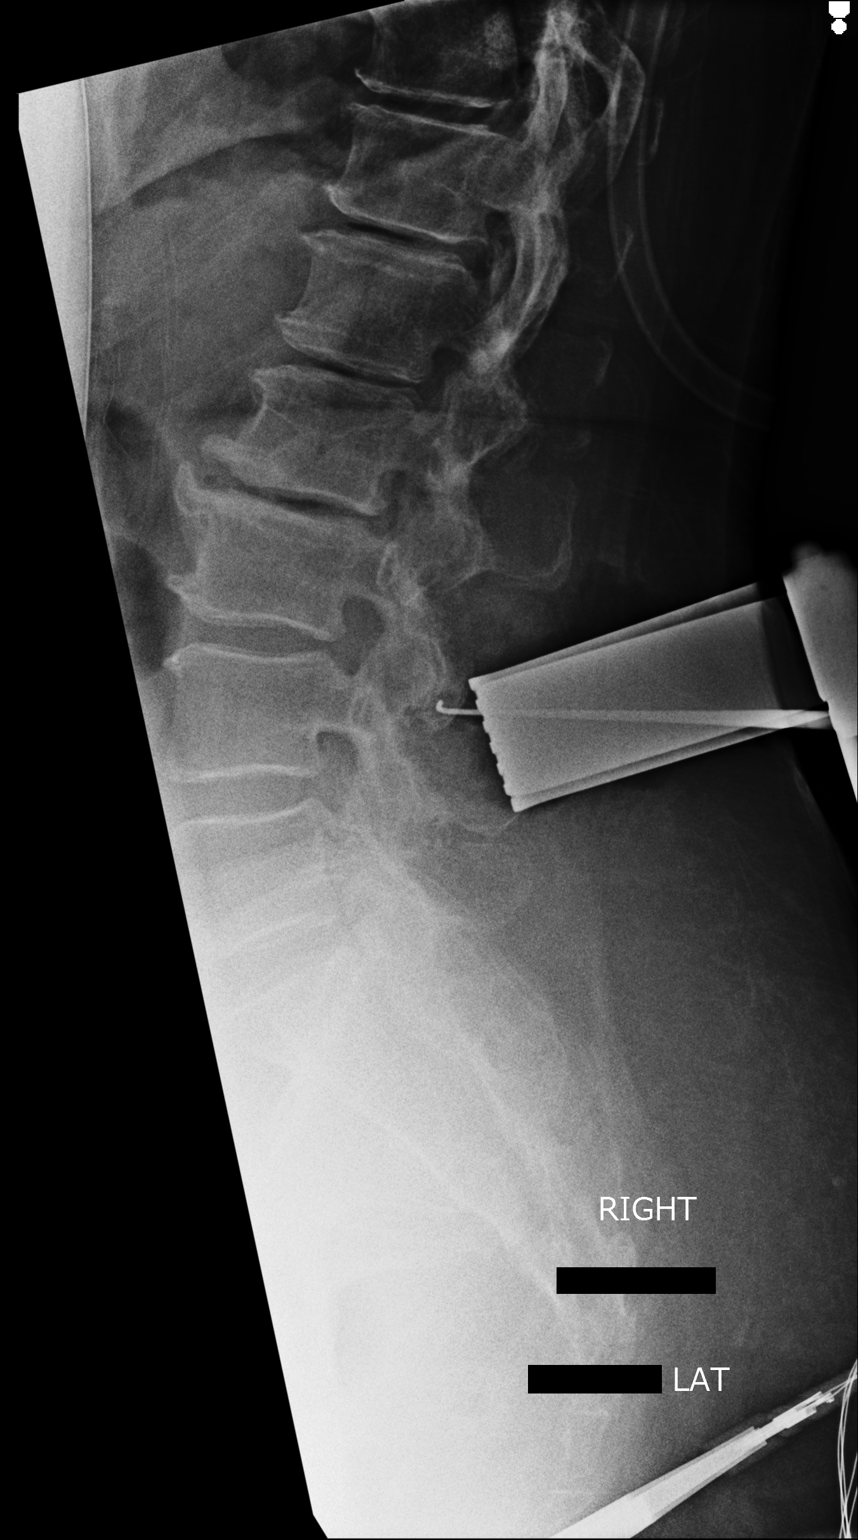

[2 of 2 positions shown; findings below may reference images not displayed]

FINDINGS: Lateral radiographs of the lumbar spine demonstrate multilevel
degenerative changes evidenced by osteophyte formation and disc
space narrowing. These findings are most prominent at the L2-L3
level. Associated vacuum disc phenomenon is present at this level.
Instrumentation overlies the posterior elements of L3 and L4.
IMPRESSION: 1. Lumbar spine degenerative disc disease.
2. Localization of the L3 and L4 vertebral bodies.

## 2014-02-20 SURGERY — LUMBAR LAMINECTOMY/DECOMPRESSION MICRODISCECTOMY 2 LEVELS
Anesthesia: General | Site: Back | Laterality: Bilateral

## 2014-02-20 MED ORDER — ONDANSETRON HCL 4 MG/2ML IJ SOLN
INTRAMUSCULAR | Status: DC | PRN
  Administered 2014-02-20: 4 mg via INTRAVENOUS

## 2014-02-20 MED ORDER — AMITRIPTYLINE HCL 25 MG PO TABS
25.0000 mg | ORAL_TABLET | Freq: Every day | ORAL | Status: DC
  Administered 2014-02-20: 25 mg via ORAL
  Filled 2014-02-20 (×2): qty 1

## 2014-02-20 MED ORDER — METHOCARBAMOL 500 MG PO TABS
500.0000 mg | ORAL_TABLET | Freq: Four times a day (QID) | ORAL | Status: DC | PRN
  Administered 2014-02-21: 500 mg via ORAL
  Filled 2014-02-20: qty 1

## 2014-02-20 MED ORDER — NEOSTIGMINE METHYLSULFATE 10 MG/10ML IV SOLN
INTRAVENOUS | Status: DC | PRN
  Administered 2014-02-20: 3 mg via INTRAVENOUS

## 2014-02-20 MED ORDER — THROMBIN 5000 UNITS EX SOLR
CUTANEOUS | Status: DC | PRN
  Administered 2014-02-20: 14:00:00 via TOPICAL

## 2014-02-20 MED ORDER — PAROXETINE HCL 20 MG PO TABS
20.0000 mg | ORAL_TABLET | Freq: Every day | ORAL | Status: DC
  Administered 2014-02-20 – 2014-02-21 (×2): 20 mg via ORAL
  Filled 2014-02-20 (×2): qty 1

## 2014-02-20 MED ORDER — ONDANSETRON HCL 4 MG/2ML IJ SOLN
INTRAMUSCULAR | Status: AC
  Filled 2014-02-20: qty 2

## 2014-02-20 MED ORDER — ONDANSETRON HCL 4 MG/2ML IJ SOLN: 4.0000 mg | INTRAMUSCULAR | Status: DC | PRN

## 2014-02-20 MED ORDER — FENTANYL CITRATE 0.05 MG/ML IJ SOLN
INTRAMUSCULAR | Status: DC | PRN
  Administered 2014-02-20: 50 ug via INTRAVENOUS
  Administered 2014-02-20: 150 ug via INTRAVENOUS
  Administered 2014-02-20: 50 ug via INTRAVENOUS

## 2014-02-20 MED ORDER — VERAPAMIL HCL ER 180 MG PO TBCR
180.0000 mg | EXTENDED_RELEASE_TABLET | Freq: Every day | ORAL | Status: DC
  Administered 2014-02-20: 180 mg via ORAL
  Filled 2014-02-20 (×2): qty 1

## 2014-02-20 MED ORDER — SODIUM CHLORIDE 0.9 % IV SOLN: 250.0000 mL | INTRAVENOUS | Status: DC

## 2014-02-20 MED ORDER — ROCURONIUM BROMIDE 100 MG/10ML IV SOLN
INTRAVENOUS | Status: DC | PRN
  Administered 2014-02-20: 50 mg via INTRAVENOUS

## 2014-02-20 MED ORDER — OXYCODONE HCL 5 MG PO TABS
10.0000 mg | ORAL_TABLET | ORAL | Status: DC | PRN
  Administered 2014-02-21: 10 mg via ORAL
  Filled 2014-02-20: qty 2

## 2014-02-20 MED ORDER — POTASSIUM CHLORIDE IN NACL 20-0.9 MEQ/L-% IV SOLN
INTRAVENOUS | Status: DC
  Filled 2014-02-20 (×3): qty 1000

## 2014-02-20 MED ORDER — EPHEDRINE SULFATE 50 MG/ML IJ SOLN
INTRAMUSCULAR | Status: DC | PRN
  Administered 2014-02-20 (×2): 10 mg via INTRAVENOUS

## 2014-02-20 MED ORDER — LACTATED RINGERS IV SOLN
INTRAVENOUS | Status: DC
  Administered 2014-02-20: 12:00:00 via INTRAVENOUS

## 2014-02-20 MED ORDER — FENTANYL CITRATE 0.05 MG/ML IJ SOLN
INTRAMUSCULAR | Status: AC
  Filled 2014-02-20: qty 5

## 2014-02-20 MED ORDER — METHOCARBAMOL 1000 MG/10ML IJ SOLN
500.0000 mg | Freq: Four times a day (QID) | INTRAVENOUS | Status: DC | PRN
  Filled 2014-02-20: qty 5

## 2014-02-20 MED ORDER — ROCURONIUM BROMIDE 50 MG/5ML IV SOLN
INTRAVENOUS | Status: AC
  Filled 2014-02-20: qty 1

## 2014-02-20 MED ORDER — CEFAZOLIN SODIUM 1-5 GM-% IV SOLN
1.0000 g | Freq: Three times a day (TID) | INTRAVENOUS | Status: AC
  Administered 2014-02-20 – 2014-02-21 (×2): 1 g via INTRAVENOUS
  Filled 2014-02-20 (×2): qty 50

## 2014-02-20 MED ORDER — HYDROMORPHONE HCL 1 MG/ML IJ SOLN
INTRAMUSCULAR | Status: AC
  Filled 2014-02-20: qty 1

## 2014-02-20 MED ORDER — SODIUM CHLORIDE 0.9 % IJ SOLN: 3.0000 mL | Freq: Two times a day (BID) | INTRAMUSCULAR | Status: DC

## 2014-02-20 MED ORDER — PROPOFOL 10 MG/ML IV BOLUS
INTRAVENOUS | Status: DC | PRN
  Administered 2014-02-20: 30 mg via INTRAVENOUS
  Administered 2014-02-20: 130 mg via INTRAVENOUS
  Administered 2014-02-20: 70 mg via INTRAVENOUS

## 2014-02-20 MED ORDER — SODIUM CHLORIDE 0.9 % IR SOLN
Status: DC | PRN
  Administered 2014-02-20: 14:00:00

## 2014-02-20 MED ORDER — MOMETASONE FURO-FORMOTEROL FUM 100-5 MCG/ACT IN AERO
2.0000 | INHALATION_SPRAY | Freq: Two times a day (BID) | RESPIRATORY_TRACT | Status: DC
  Administered 2014-02-20 – 2014-02-21 (×2): 2 via RESPIRATORY_TRACT
  Filled 2014-02-20: qty 8.8

## 2014-02-20 MED ORDER — THROMBIN 20000 UNITS EX SOLR
CUTANEOUS | Status: DC | PRN
  Administered 2014-02-20: 14:00:00 via TOPICAL

## 2014-02-20 MED ORDER — LACTATED RINGERS IV SOLN
INTRAVENOUS | Status: DC | PRN
  Administered 2014-02-20 (×2): via INTRAVENOUS

## 2014-02-20 MED ORDER — PHENYLEPHRINE HCL 10 MG/ML IJ SOLN
INTRAMUSCULAR | Status: DC | PRN
  Administered 2014-02-20 (×2): 80 ug via INTRAVENOUS

## 2014-02-20 MED ORDER — HYDROMORPHONE HCL 1 MG/ML IJ SOLN
0.2500 mg | INTRAMUSCULAR | Status: DC | PRN
  Administered 2014-02-20: 0.5 mg via INTRAVENOUS

## 2014-02-20 MED ORDER — SODIUM CHLORIDE 0.9 % IJ SOLN: 3.0000 mL | INTRAMUSCULAR | Status: DC | PRN

## 2014-02-20 MED ORDER — PHENYLEPHRINE 40 MCG/ML (10ML) SYRINGE FOR IV PUSH (FOR BLOOD PRESSURE SUPPORT)
PREFILLED_SYRINGE | INTRAVENOUS | Status: AC
  Filled 2014-02-20: qty 10

## 2014-02-20 MED ORDER — MORPHINE SULFATE 2 MG/ML IJ SOLN
1.0000 mg | INTRAMUSCULAR | Status: DC | PRN
  Administered 2014-02-20: 2 mg via INTRAVENOUS
  Filled 2014-02-20: qty 1

## 2014-02-20 MED ORDER — PROPOFOL 10 MG/ML IV BOLUS
INTRAVENOUS | Status: AC
  Filled 2014-02-20: qty 20

## 2014-02-20 MED ORDER — LIDOCAINE HCL (CARDIAC) 20 MG/ML IV SOLN
INTRAVENOUS | Status: AC
  Filled 2014-02-20: qty 5

## 2014-02-20 MED ORDER — ALPRAZOLAM 0.5 MG PO TABS: 0.5000 mg | ORAL_TABLET | Freq: Two times a day (BID) | ORAL | Status: DC | PRN

## 2014-02-20 MED ORDER — ESTRADIOL 0.05 MG/24HR TD PTWK: 0.0500 mg | MEDICATED_PATCH | TRANSDERMAL | Status: DC

## 2014-02-20 MED ORDER — BUPIVACAINE HCL (PF) 0.25 % IJ SOLN
INTRAMUSCULAR | Status: DC | PRN
  Administered 2014-02-20: 10 mL

## 2014-02-20 MED ORDER — MENTHOL 3 MG MT LOZG: 1.0000 | LOZENGE | OROMUCOSAL | Status: DC | PRN

## 2014-02-20 MED ORDER — MIDAZOLAM HCL 2 MG/2ML IJ SOLN
INTRAMUSCULAR | Status: AC
  Filled 2014-02-20: qty 2

## 2014-02-20 MED ORDER — ACETAMINOPHEN 325 MG PO TABS: 650.0000 mg | ORAL_TABLET | ORAL | Status: DC | PRN

## 2014-02-20 MED ORDER — LIDOCAINE HCL (CARDIAC) 20 MG/ML IV SOLN
INTRAVENOUS | Status: DC | PRN
  Administered 2014-02-20: 40 mg via INTRAVENOUS
  Administered 2014-02-20: 20 mg via INTRAVENOUS

## 2014-02-20 MED ORDER — MIDAZOLAM HCL 5 MG/5ML IJ SOLN
INTRAMUSCULAR | Status: DC | PRN
  Administered 2014-02-20: 2 mg via INTRAVENOUS

## 2014-02-20 MED ORDER — GLYCOPYRROLATE 0.2 MG/ML IJ SOLN
INTRAMUSCULAR | Status: DC | PRN
  Administered 2014-02-20: 0.4 mg via INTRAVENOUS

## 2014-02-20 MED ORDER — 0.9 % SODIUM CHLORIDE (POUR BTL) OPTIME
TOPICAL | Status: DC | PRN
  Administered 2014-02-20: 1000 mL

## 2014-02-20 MED ORDER — PHENOL 1.4 % MT LIQD: 1.0000 | OROMUCOSAL | Status: DC | PRN

## 2014-02-20 MED ORDER — ACETAMINOPHEN 650 MG RE SUPP: 650.0000 mg | RECTAL | Status: DC | PRN

## 2014-02-20 SURGICAL SUPPLY — 47 items
BAG DECANTER FOR FLEXI CONT (MISCELLANEOUS) ×2 IMPLANT
BENZOIN TINCTURE PRP APPL 2/3 (GAUZE/BANDAGES/DRESSINGS) ×2 IMPLANT
BUR MATCHSTICK NEURO 3.0 LAGG (BURR) ×2 IMPLANT
CANISTER SUCT 3000ML (MISCELLANEOUS) ×2 IMPLANT
CONT SPEC 4OZ CLIKSEAL STRL BL (MISCELLANEOUS) ×2 IMPLANT
DRAPE LAPAROTOMY 100X72X124 (DRAPES) ×2 IMPLANT
DRAPE MICROSCOPE LEICA (MISCELLANEOUS) ×2 IMPLANT
DRAPE POUCH INSTRU U-SHP 10X18 (DRAPES) ×2 IMPLANT
DRAPE SURG 17X23 STRL (DRAPES) ×2 IMPLANT
DRSG OPSITE 4X5.5 SM (GAUZE/BANDAGES/DRESSINGS) ×2 IMPLANT
DRSG OPSITE POSTOP 3X4 (GAUZE/BANDAGES/DRESSINGS) ×2 IMPLANT
DRSG TELFA 3X8 NADH (GAUZE/BANDAGES/DRESSINGS) ×2 IMPLANT
DURAPREP 26ML APPLICATOR (WOUND CARE) ×2 IMPLANT
ELECT REM PT RETURN 9FT ADLT (ELECTROSURGICAL) ×2
ELECTRODE REM PT RTRN 9FT ADLT (ELECTROSURGICAL) ×1 IMPLANT
EVACUATOR 1/8 PVC DRAIN (DRAIN) ×2 IMPLANT
GAUZE SPONGE 4X4 16PLY XRAY LF (GAUZE/BANDAGES/DRESSINGS) IMPLANT
GLOVE BIO SURGEON STRL SZ8 (GLOVE) ×4 IMPLANT
GLOVE BIOGEL PI IND STRL 7.5 (GLOVE) ×2 IMPLANT
GLOVE BIOGEL PI INDICATOR 7.5 (GLOVE) ×2
GLOVE ECLIPSE 7.5 STRL STRAW (GLOVE) ×4 IMPLANT
GLOVE INDICATOR 8.5 STRL (GLOVE) ×2 IMPLANT
GLOVE SURG SS PI 7.0 STRL IVOR (GLOVE) ×4 IMPLANT
GOWN STRL REUS W/ TWL LRG LVL3 (GOWN DISPOSABLE) IMPLANT
GOWN STRL REUS W/ TWL XL LVL3 (GOWN DISPOSABLE) ×4 IMPLANT
GOWN STRL REUS W/TWL 2XL LVL3 (GOWN DISPOSABLE) IMPLANT
GOWN STRL REUS W/TWL LRG LVL3 (GOWN DISPOSABLE)
GOWN STRL REUS W/TWL XL LVL3 (GOWN DISPOSABLE) ×4
HEMOSTAT POWDER KIT SURGIFOAM (HEMOSTASIS) IMPLANT
KIT BASIN OR (CUSTOM PROCEDURE TRAY) ×2 IMPLANT
KIT ROOM TURNOVER OR (KITS) ×2 IMPLANT
NEEDLE HYPO 25X1 1.5 SAFETY (NEEDLE) ×2 IMPLANT
NEEDLE SPNL 20GX3.5 QUINCKE YW (NEEDLE) IMPLANT
NS IRRIG 1000ML POUR BTL (IV SOLUTION) ×2 IMPLANT
PACK LAMINECTOMY NEURO (CUSTOM PROCEDURE TRAY) ×2 IMPLANT
PAD ARMBOARD 7.5X6 YLW CONV (MISCELLANEOUS) ×6 IMPLANT
RUBBERBAND STERILE (MISCELLANEOUS) ×4 IMPLANT
SPONGE SURGIFOAM ABS GEL SZ50 (HEMOSTASIS) ×2 IMPLANT
STRIP CLOSURE SKIN 1/2X4 (GAUZE/BANDAGES/DRESSINGS) ×2 IMPLANT
SUT VIC AB 0 CT1 18XCR BRD8 (SUTURE) ×1 IMPLANT
SUT VIC AB 0 CT1 8-18 (SUTURE) ×2
SUT VIC AB 2-0 CP2 18 (SUTURE) ×2 IMPLANT
SUT VIC AB 3-0 SH 8-18 (SUTURE) ×2 IMPLANT
SYR 20ML ECCENTRIC (SYRINGE) ×2 IMPLANT
TOWEL OR 17X24 6PK STRL BLUE (TOWEL DISPOSABLE) ×2 IMPLANT
TOWEL OR 17X26 10 PK STRL BLUE (TOWEL DISPOSABLE) ×2 IMPLANT
WATER STERILE IRR 1000ML POUR (IV SOLUTION) ×2 IMPLANT

## 2014-02-20 NOTE — Transfer of Care (Signed)
Immediate Anesthesia Transfer of Care Note  Patient: Mallory Klein  Procedure(s) Performed: Procedure(s): LUMBAR TWO THREE, LUMBAR THREE FOUR LUMBAR LAMINECTOMY/DECOMPRESSION MICRODISCECTOMY 2 LEVELS (Bilateral)  Patient Location: PACU  Anesthesia Type:General  Level of Consciousness: awake, alert  and oriented  Airway & Oxygen Therapy: Patient Spontanous Breathing and Patient connected to face mask oxygen  Post-op Assessment: Report given to PACU RN, Post -op Vital signs reviewed and stable, Patient moving all extremities and Patient moving all extremities X 4  Post vital signs: Reviewed and stable  Complications: No apparent anesthesia complications

## 2014-02-20 NOTE — Anesthesia Postprocedure Evaluation (Signed)
  Anesthesia Post-op Note  Patient: Mallory Klein  Procedure(s) Performed: Procedure(s): LUMBAR TWO THREE, LUMBAR THREE FOUR LUMBAR LAMINECTOMY/DECOMPRESSION MICRODISCECTOMY 2 LEVELS (Bilateral)  Patient Location: PACU  Anesthesia Type:General  Level of Consciousness: awake and alert   Airway and Oxygen Therapy: Patient Spontanous Breathing  Post-op Pain: none  Post-op Assessment: Post-op Vital signs reviewed, Patient's Cardiovascular Status Stable and Respiratory Function Stable  Post-op Vital Signs: Reviewed  Filed Vitals:   02/20/14 1606  BP: 134/87  Pulse: 90  Temp:   Resp: 19    Complications: No apparent anesthesia complications

## 2014-02-20 NOTE — Anesthesia Preprocedure Evaluation (Addendum)
Anesthesia Evaluation  Patient identified by MRN, date of birth, ID band Patient awake    Reviewed: Allergy & Precautions, H&P , NPO status , Patient's Chart, lab work & pertinent test results  Airway Mallampati: III  TM Distance: >3 FB Neck ROM: Full    Dental no notable dental hx. (+) Teeth Intact, Dental Advisory Given   Pulmonary asthma , sleep apnea ,  breath sounds clear to auscultation  Pulmonary exam normal       Cardiovascular hypertension, Pt. on medications Rhythm:Regular Rate:Normal     Neuro/Psych  Headaches, Anxiety Depression    GI/Hepatic Neg liver ROS, GERD-  Medicated and Controlled,  Endo/Other  negative endocrine ROSMorbid obesity  Renal/GU negative Renal ROS  negative genitourinary   Musculoskeletal  (+) Arthritis -, Osteoarthritis,    Abdominal   Peds  Hematology negative hematology ROS (+)   Anesthesia Other Findings   Reproductive/Obstetrics negative OB ROS                            Anesthesia Physical Anesthesia Plan  ASA: II  Anesthesia Plan: General   Post-op Pain Management:    Induction: Intravenous  Airway Management Planned: Oral ETT  Additional Equipment:   Intra-op Plan:   Post-operative Plan: Extubation in OR  Informed Consent: I have reviewed the patients History and Physical, chart, labs and discussed the procedure including the risks, benefits and alternatives for the proposed anesthesia with the patient or authorized representative who has indicated his/her understanding and acceptance.   Dental advisory given  Plan Discussed with: CRNA  Anesthesia Plan Comments:         Anesthesia Quick Evaluation

## 2014-02-20 NOTE — Anesthesia Procedure Notes (Signed)
Procedure Name: Intubation Date/Time: 02/20/2014 1:06 PM Performed by: Greggory Stallion, Xzandria Clevinger L Pre-anesthesia Checklist: Patient identified, Emergency Drugs available, Suction available, Patient being monitored and Timeout performed Patient Re-evaluated:Patient Re-evaluated prior to inductionOxygen Delivery Method: Circle system utilized Preoxygenation: Pre-oxygenation with 100% oxygen Intubation Type: IV induction Ventilation: Oral airway inserted - appropriate to patient size and Mask ventilation with difficulty Laryngoscope Size: Mac and 3 Grade View: Grade IV Tube type: Oral Tube size: 7.5 mm Number of attempts: 2 Airway Equipment and Method: Stylet and Video-laryngoscopy Placement Confirmation: ETT inserted through vocal cords under direct vision,  positive ETCO2 and breath sounds checked- equal and bilateral Secured at: 20 cm Tube secured with: Tape Dental Injury: Teeth and Oropharynx as per pre-operative assessment  Difficulty Due To: Difficult Airway- due to dentition and Difficult Airway- due to anterior larynx Future Recommendations: Recommend- induction with short-acting agent, and alternative techniques readily available Comments: Prominent dentition noted preop but pt had good mouth opening. DL c Mac#3 able to visualize base of arytenoids but unable to advance tube up and through. Pt mask ventilated c opa sats maintained. Second dl with glide scope with good visualization but difficulty in guiding ett remained challenging due to anterior larynx

## 2014-02-20 NOTE — Op Note (Signed)
02/20/2014  3:14 PM  PATIENT:  Mallory Klein  68 y.o. female  PRE-OPERATIVE DIAGNOSIS:  Lumbar spinal stenosis L2-3 and L3-4 with back and right leg pain  POST-OPERATIVE DIAGNOSIS:  Same  PROCEDURE:  Decompressive lumbar laminectomy medial facetectomy and foraminotomies at L2-3 and L3-4  SURGEON:  Sherley Bounds, MD  ASSISTANTS: Dr. Saintclair Halsted  ANESTHESIA:   General  EBL: 200 ml  Total I/O In: 1000 [I.V.:1000] Out: 200 [Blood:200]  BLOOD ADMINISTERED:none  DRAINS: Medium Hemovac   SPECIMEN:  No Specimen  INDICATION FOR PROCEDURE: This patient presented with a long history of back and right leg pain. She tried medical management and epidural steroid injections without prolonged relief. She had a CT showed significant stenosis at L2-3 and L3-4. Recommended decompressive laminectomy. Patient understood the risks, benefits, and alternatives and potential outcomes and wished to proceed.  PROCEDURE DETAILS: The patient was taken to the operating room and after induction of adequate generalized endotracheal anesthesia, the patient was rolled into the prone position on the Wilson frame and all pressure points were padded. The lumbar region was cleaned and then prepped with DuraPrep and draped in the usual sterile fashion. 5 cc of local anesthesia was injected and then a dorsal midline incision was made and carried down to the lumbo sacral fascia. The fascia was opened and the paraspinous musculature was taken down in a subperiosteal fashion to expose L2-3 and L3-4 bilaterally. Intraoperative x-ray confirmed my level, and then I removed the spinous process of L3 and the inferior spinous process of L2. I then used a combination of the high-speed drill and the Kerrison punches to perform laminectomy, medial facetectomy, and foraminotomy at L2-3 and L3-4 bilaterally. The underlying yellow ligament was opened and removed in a piecemeal fashion to expose the underlying dura and exiting nerve root  bilaterally at each level. I undercut the lateral recess and dissected down until I was medial to and distal to the pedicle. There was a large osteophyte on the left at L2-3 that was undercut and removed. There was severe overgrowth of the yellow ligament at both L2-3 and L3-4 . I continued to move overgrowth until The nerve root was well decompressed bilaterally at each level. We then gently retracted the nerve root medially with a retractor, coagulated the epidural venous vasculature, and inspected the disc space. I found disc bulges but no disc herniation. I then palpated with a coronary dilator along the nerve root and into the foramen to assure adequate decompression. I felt no more compression of the nerve root. I irrigated with saline solution containing bacitracin. Achieved hemostasis with bipolar cautery, lined the dura with Gelfoam, and then closed the fascia with 0 Vicryl. I closed the subcutaneous tissues with 2-0 Vicryl and the subcuticular tissues with 3-0 Vicryl. The skin was then closed with benzoin and Steri-Strips. The drapes were removed, a sterile dressing was applied. The patient was awakened from general anesthesia and transferred to the recovery room in stable condition. At the end of the procedure all sponge, needle and instrument counts were correct.   PLAN OF CARE: Admit for overnight observation  PATIENT DISPOSITION:  PACU - hemodynamically stable.   Delay start of Pharmacological VTE agent (>24hrs) due to surgical blood loss or risk of bleeding:  yes

## 2014-02-20 NOTE — H&P (Signed)
Subjective: Patient is a 68 y.o. female admitted for decompressive laminectomy for stenosis. Onset of symptoms was a few months ago, gradually worsening since that time.  The pain is rated severe, and is located at the across the lower back and radiates to right thigh. The pain is described as aching and occurs intermittently. The symptoms have been progressive. Symptoms are exacerbated by exercise. MRI or CT showed severe spinal stenosis L2-3 and L3-4   Past Medical History  Diagnosis Date  . Asthma   . Hypertension   . Anxiety   . Depression   . GERD (gastroesophageal reflux disease)   . Osteoarthritis   . Diverticulosis of colon (without mention of hemorrhage)   . Internal hemorrhoids without mention of complication   . Esophageal stricture   . Shortness of breath dyspnea     with allergies  . Heart murmur     Past Surgical History  Procedure Laterality Date  . Abdominal hysterectomy    . Splenectomy    . Breast cyst excision Left 1980's  . Total knee arthroplasty Right 2013  . Tibia fracture surgery Right   . Tibia fracture surgery Left     Prior to Admission medications   Medication Sig Start Date End Date Taking? Authorizing Provider  ALPRAZolam Duanne Moron) 0.5 MG tablet Take 1 tablet (0.5 mg total) by mouth 2 (two) times daily as needed. 05/29/11  Yes Aleksei Plotnikov V, MD  cetirizine (ZYRTEC) 10 MG tablet Take 10 mg by mouth daily.     Yes Historical Provider, MD  dexlansoprazole (DEXILANT) 60 MG capsule Take 1 capsule (60 mg total) by mouth daily. 04/19/13  Yes Irene Shipper, MD  Fluticasone-Salmeterol (ADVAIR DISKUS) 250-50 MCG/DOSE AEPB Inhale 1 puff into the lungs every 12 (twelve) hours. 08/06/11  Yes Aleksei Plotnikov V, MD  methocarbamol (ROBAXIN) 750 MG tablet Take 750 mg by mouth daily as needed for muscle spasms.    Yes Historical Provider, MD  Omega-3 Fatty Acids (FISH OIL) 1000 MG CAPS Take 1 capsule by mouth daily.   Yes Historical Provider, MD  PARoxetine (PAXIL) 10  MG tablet TAKE 2 TABLETS BY MOUTH DAILY   Yes Aleksei Plotnikov V, MD  verapamil (CALAN-SR) 180 MG CR tablet Take 1 tablet by mouth at bedtime. 03/07/13  Yes Historical Provider, MD  amitriptyline (ELAVIL) 25 MG tablet Take 1-2 tablets (25-50 mg total) by mouth at bedtime. 08/06/11 05/12/13  Cassandria Anger, MD  Cholecalciferol (VITAMIN D3) 1000 UNITS tablet Take 2 tablets by mouth once daily     Historical Provider, MD  clotrimazole (MYCELEX) 10 MG troche Take 1 tablet (10 mg total) by mouth 5 (five) times daily. Patient not taking: Reported on 02/07/2014 05/12/13   Hendricks Limes, MD  clotrimazole-betamethasone (LOTRISONE) cream Apply topically 2 (two) times daily. 12/04/11   Aleksei Plotnikov V, MD  diclofenac sodium (VOLTAREN) 1 % GEL Apply 4 g topically 4 (four) times daily. As directed. 04/25/13   Aleksei Plotnikov V, MD  estradiol (VIVELLE-DOT) 0.05 MG/24HR patch Place 1 patch (0.05 mg total) onto the skin once a week. Patient not taking: Reported on 02/07/2014 10/21/12   Lew Dawes V, MD  fluconazole (DIFLUCAN) 150 MG tablet Take 1 tablet (150 mg total) by mouth once. Patient not taking: Reported on 02/07/2014 05/12/13   Hendricks Limes, MD  hydrOXYzine (ATARAX/VISTARIL) 10 MG tablet Take 1 tablet (10 mg total) by mouth every 6 (six) hours as needed. Patient not taking: Reported on 02/07/2014 05/12/13  Hendricks Limes, MD  Multiple Vitamin (MULTIVITAMIN) tablet Take 1 tablet by mouth daily.    Historical Provider, MD  Oxybutynin Chloride (GELNIQUE) 10 % GEL Use qd as dirrected Patient not taking: Reported on 02/07/2014 12/04/11   Cassandria Anger, MD   Allergies  Allergen Reactions  . Ace Inhibitors     Patient doesn't recall reaction.  jkl  . Aspirin Other (See Comments)    bruising  . Citalopram Hydrobromide Diarrhea    History  Substance Use Topics  . Smoking status: Never Smoker   . Smokeless tobacco: Never Used  . Alcohol Use: No     Comment: wine on occ      Family History  Problem Relation Age of Onset  . Hypertension Mother   . Diabetes type II Mother   . Asthma Mother   . Diabetes Mother   . Colon cancer Neg Hx   . Prostate cancer Father   . Mental illness Father     dementia     Review of Systems  Positive ROS: Negative  All other systems have been reviewed and were otherwise negative with the exception of those mentioned in the HPI and as above.  Objective: Vital signs in last 24 hours: Temp:  [97.8 F (36.6 C)-98 F (36.7 C)] 97.8 F (36.6 C) (12/29 1114) Pulse Rate:  [80] 80 (12/29 1114) Resp:  [20] 20 (12/29 1113) BP: (119)/(59) 119/59 mmHg (12/29 1113) SpO2:  [98 %] 98 % (12/29 1113) Weight:  [203 lb (92.08 kg)] 203 lb (92.08 kg) (12/29 1113)  General Appearance: Alert, cooperative, no distress, appears stated age Head: Normocephalic, without obvious abnormality, atraumatic Eyes: PERRL, conjunctiva/corneas clear, EOM's intact    Neck: Supple, symmetrical, trachea midline Back: Symmetric, no curvature, ROM normal, no CVA tenderness Lungs:  respirations unlabored Heart: Regular rate and rhythm Abdomen: Soft, non-tender Extremities: Extremities normal, atraumatic, no cyanosis or edema Pulses: 2+ and symmetric all extremities Skin: Skin color, texture, turgor normal, no rashes or lesions  NEUROLOGIC:   Mental status: Alert and oriented x4,  no aphasia, good attention span, fund of knowledge, and memory Motor Exam - grossly normal Sensory Exam - grossly normal Reflexes: trzace Coordination - grossly normal Gait - grossly normal Balance - grossly normal Cranial Nerves: I: smell Not tested  II: visual acuity  OS: nl    OD: nl  II: visual fields Full to confrontation  II: pupils Equal, round, reactive to light  III,VII: ptosis None  III,IV,VI: extraocular muscles  Full ROM  V: mastication Normal  V: facial light touch sensation  Normal  V,VII: corneal reflex  Present  VII: facial muscle function - upper   Normal  VII: facial muscle function - lower Normal  VIII: hearing Not tested  IX: soft palate elevation  Normal  IX,X: gag reflex Present  XI: trapezius strength  5/5  XI: sternocleidomastoid strength 5/5  XI: neck flexion strength  5/5  XII: tongue strength  Normal    Data Review Lab Results  Component Value Date   WBC 10.9* 02/07/2014   HGB 12.3 02/07/2014   HCT 38.4 02/07/2014   MCV 90.8 02/07/2014   PLT 556* 02/07/2014   Lab Results  Component Value Date   NA 140 02/07/2014   K 3.7 02/07/2014   CL 99 02/07/2014   CO2 28 02/07/2014   BUN 16 02/07/2014   CREATININE 1.01 02/07/2014   GLUCOSE 77 02/07/2014   Lab Results  Component Value Date   INR 0.97  02/07/2014    Assessment/Plan: Patient admitted for decompressive laminectomy L3-4 and L2-3 for severe spinal stenosis. Patient has failed a reasonable attempt at conservative therapy.  I explained the condition and procedure to the patient and answered any questions.  Patient wishes to proceed with procedure as planned. Understands risks/ benefits and typical outcomes of procedure.   Guage Efferson S 02/20/2014 12:48 PM

## 2014-02-21 ENCOUNTER — Encounter (HOSPITAL_COMMUNITY): Payer: Self-pay | Admitting: Neurological Surgery

## 2014-02-21 DIAGNOSIS — M4806 Spinal stenosis, lumbar region: Secondary | ICD-10-CM | POA: Diagnosis not present

## 2014-02-21 MED ORDER — OXYCODONE HCL 10 MG PO TABS: 10.0000 mg | ORAL_TABLET | ORAL | Status: DC | PRN

## 2014-02-21 NOTE — Discharge Instructions (Signed)
Wound Care Keep incision covered and dry for one week.  If you shower prior to then, cover incision with plastic wrap.  You may remove outer bandage after one week and shower.  Do not put any creams, lotions, or ointments on incision. Leave steri-strips on neck.  They will fall off by themselves. Activity Walk each and every day, increasing distance each day. No lifting greater than 5 lbs.  Avoid bending, arching, or twisting. No driving for 2 weeks; may ride as a passenger locally. If provided with back brace, wear when out of bed.  It is not necessary to wear in bed. Diet Resume your normal diet.  Return to Work Will be discussed at you follow up appointment. Call Your Doctor If Any of These Occur Redness, drainage, or swelling at the wound.  Temperature greater than 101 degrees. Severe pain not relieved by pain medication. Incision starts to come apart. Follow Up Appt Call today for appointment in 1-2 weeks ([phone]) or for problems.  If you have any hardware placed in your spine, you will need an x-ray before your appointment.   Laminectomy - Laminotomy - Discectomy Your surgeon has decided that a laminectomy (entire lamina removal) or laminotomy (partial lamina removal) is the best treatment for your back problem. These procedures involve removal of bone to relieve pressure on nerve roots. It allows the surgeon access to parts of the spine where other problems are located. This could be an injured disc (the cartilage-like structures located between the bones of the back). In this surgery your surgeon removes a part of the boney arch that surrounds your spinal canal. This may be compressing nerve roots. In some cases, the surgeon will remove the disc and fuse (stick together) vertebral bodies (the bones of your back) to make the spine more stable. The type of procedure you will need is usually decided prior to surgery, however modifications may be necessary. The time in surgery depends  on the findings in surgery and the procedure necessary to correct the problems. DISCECTOMY For people with disc problems, the surgeon removes the portion of the disc that is causing the pressure on the nerve root. Some surgeons perform a micro (small) discectomy, which may require removal of only a small portion of the lamina. A disc nucleus (center) may also be removed either through a needle (percutaneous discectomy) or by injecting an enzyme called chymopapain into the disc. Chymopapain is an enzyme that dissolves the disc. For people with back instability, the surgeon fuses vertebrae that are next to each other with tiny pieces of bone. These are used as bone grafts on the facets, or between the vertebrae. When this heals, the bones will no longer be able to move. These bone chips are often taken from the pelvic bones. Bones and bone grafts grow into one unit, stabilizing the segments of the spinal column. LET YOUR CAREGIVER KNOW ABOUT:  Allergies.  Medicines taken including herbs, eyedrops, over-the-counter medicines, and creams.  Use of steroids (by mouth or creams).  Previous problems with anesthetics or numbing medicine.  Possibility of pregnancy, if this applies.  History of blood clots (thrombophlebitis).  History of bleeding or blood problems.  Previous surgery.  Other health problems. RISKS AND COMPLICATIONS Your caregiver will discuss possible risks and complications with you before surgery. In addition to the usual risks of anesthesia, other common risks and complications include:  Blood loss and replacement.  Temporary increase in pain due to surgery.  Uncorrected back pain.  Infection.  New nerve damage (tingling, numbness, and pain). BEFORE THE PROCEDURE  Stop smoking at least 1 week prior to surgery. This lowers risk during surgery.  Your caregiver may advise that you stop taking certain medicines that may affect the outcome of the surgery and your ability to  heal. For example, you may need to stop taking anti-inflammatories, such as aspirin, because of possible bleeding problems. Other medicines may have interactions with anesthesia.  Tell your caregiver if you have been on steroids for long periods of time. Often, additional steroids are administered intravenously before and during the procedure to prevent complications.  You should be present 60 minutes prior to your procedure or as directed. AFTER THE PROCEDURE After surgery, you will be taken to the recovery area where a nurse will watch and check your progress. Generally, you will be allowed to go home within 1 week barring other problems. HOME CARE INSTRUCTIONS   Check the surgical cut (incision) twice a day for signs of infection. Some signs may include a bad smelling, greenish or yellowish discharge from the wound; increased pain or increased redness over the incision site; an opening of the incision; flu-like symptoms; or a temperature above 101.5 F (38.6 C).  Change your bandages in about 24 to 36 hours following surgery or as directed.  You may shower once the bandage is removed or as directed. Avoid bathtubs, swimming pools, and hot tubs for 3 weeks or until your incision has healed completely. If you have stitches (sutures) or staples they may be removed 2 to 3 weeks after surgery, or as directed by your caregiver.  Follow your caregiver's instructions for activities, exercises, and physical therapy.  Weight reduction may be beneficial if you are overweight.  Walking is permitted. You may use a treadmill without an incline. Cut down on activities if you have discomfort. You may also go up and down stairs as tolerated.  Do not lift anything heavier than 10 to 15 pounds. Avoid bending or twisting at the waist. Always bend your knees.  Maintain strength and range of motion as instructed.  No driving is permitted for 2 to 3 weeks, or as directed by your caregiver. You may be a  passenger for 20 to 30 minute trips. Lying back in the passenger seat may be more comfortable for you.  Limit your sitting to 20 to 30 minute intervals. You should lie down or walk in between sitting periods. There are no limitations for sitting in a recliner chair.  Only take over-the-counter or prescription medicines for pain, discomfort, or fever as directed by your caregiver. SEEK MEDICAL CARE IF:   There is increased bleeding (more than a small spot) from the wound.  You notice redness, swelling, or increasing pain in the wound.  Pus is coming from the wound.  An unexplained oral temperature above 102 F (38.9 C) develops.  You notice a bad smell coming from the wound or dressing. SEEK IMMEDIATE MEDICAL CARE IF:   You develop a rash.  You have difficulty breathing.  You have any allergic problems. Document Released: 02/07/2000 Document Revised: 06/26/2013 Document Reviewed: 12/06/2007 [facility] Patient Information 2015 [place], [place]. This information is not intended to replace advice given to you by your health care provider. Make sure you discuss any questions you have with your health care provider.

## 2014-02-21 NOTE — Progress Notes (Signed)
Pt given D/C instructions with Rx, verbal understanding was provided. Pt's incision is clean and dry with no sign of infection. Pt's IV and hemovac were removed prior to D/C. Pt D/C'd home via wheelchair @ 1315 per MD order. Pt is stable @ D/C and has no other needs at this time. Holli Humbles, RN

## 2014-02-21 NOTE — Discharge Summary (Signed)
Physician Discharge Summary  Patient ID: Mallory Klein MRN: [id number] DOB/AGE: August 12, 1945 68 y.o.  Admit date: 02/20/2014 Discharge date: 02/21/2014  Admission Diagnoses: lumbar stenosis   Discharge Diagnoses: same   Discharged Condition: good  Hospital Course: The patient was admitted on 02/20/2014 and taken to the operating room where the patient underwent DLL L2-3 L3-4. The patient tolerated the procedure well and was taken to the recovery room and then to the floor in stable condition. The hospital course was routine. There were no complications. The wound remained clean dry and intact. Pt had appropriate back soreness. No complaints of leg pain or new N/T/W. The patient remained afebrile with stable vital signs, and tolerated a regular diet. The patient continued to increase activities, and pain was well controlled with oral pain medications.   Consults: None  Significant Diagnostic Studies:  Results for orders placed or performed during the hospital encounter of 02/07/14  Surgical pcr screen  Result Value Ref Range   MRSA, PCR NEGATIVE NEGATIVE   Staphylococcus aureus NEGATIVE NEGATIVE  Basic metabolic panel  Result Value Ref Range   Sodium 140 137 - 147 mEq/L   Potassium 3.7 3.7 - 5.3 mEq/L   Chloride 99 96 - 112 mEq/L   CO2 28 19 - 32 mEq/L   Glucose, Bld 77 70 - 99 mg/dL   BUN 16 6 - 23 mg/dL   Creatinine, Ser 1.01 0.50 - 1.10 mg/dL   Calcium 9.8 8.4 - 10.5 mg/dL   GFR calc non Af Amer 56 (L) >90 mL/min   GFR calc Af Amer 65 (L) >90 mL/min   Anion gap 13 5 - 15  CBC WITH DIFFERENTIAL  Result Value Ref Range   WBC 10.9 (H) 4.0 - 10.5 K/uL   RBC 4.23 3.87 - 5.11 MIL/uL   Hemoglobin 12.3 12.0 - 15.0 g/dL   HCT 38.4 36.0 - 46.0 %   MCV 90.8 78.0 - 100.0 fL   MCH 29.1 26.0 - 34.0 pg   MCHC 32.0 30.0 - 36.0 g/dL   RDW 16.0 (H) 11.5 - 15.5 %   Platelets 556 (H) 150 - 400 K/uL   Neutrophils Relative % 62 43 - 77 %   Neutro Abs 6.7 1.7 - 7.7 K/uL   Lymphocytes  Relative 26 12 - 46 %   Lymphs Abs 2.8 0.7 - 4.0 K/uL   Monocytes Relative 10 3 - 12 %   Monocytes Absolute 1.1 (H) 0.1 - 1.0 K/uL   Eosinophils Relative 2 0 - 5 %   Eosinophils Absolute 0.3 0.0 - 0.7 K/uL   Basophils Relative 0 0 - 1 %   Basophils Absolute 0.0 0.0 - 0.1 K/uL  Protime-INR  Result Value Ref Range   Prothrombin Time 13.0 11.6 - 15.2 seconds   INR 0.97 0.00 - 1.49    Chest 2 View  02/07/2014   CLINICAL DATA:  Preoperative exam.  Back surgery.  EXAM: CHEST  2 VIEW  COMPARISON:  01/19/2010  FINDINGS: The heart size and mediastinal contours are within normal limits. Both lungs are clear. The visualized skeletal structures are unremarkable.  IMPRESSION: No active cardiopulmonary disease.   Electronically Signed   By: Rolm Baptise M.D.   On: 02/07/2014 15:55   Dg Lumbar Spine 2-3 Views  02/20/2014   CLINICAL DATA:  68 year old for a lumbar laminectomy  EXAM: LUMBAR SPINE - 2-3 VIEW  COMPARISON:  CT from 01/17/2014  FINDINGS: Lateral radiographs of the lumbar spine demonstrate multilevel degenerative changes evidenced  by osteophyte formation and disc space narrowing. These findings are most prominent at the L2-L3 level. Associated vacuum disc phenomenon is present at this level. Instrumentation overlies the posterior elements of L3 and L4.  IMPRESSION: 1. Lumbar spine degenerative disc disease. 2. Localization of the L3 and L4 vertebral bodies.   Electronically Signed   By: Rosemarie Ax   On: 02/20/2014 13:50    Antibiotics:  Anti-infectives    Start     Dose/Rate Route Frequency Ordered Stop   02/20/14 2100  ceFAZolin (ANCEF) IVPB 1 g/50 mL premix     1 g100 mL/hr over 30 Minutes Intravenous Every 8 hours 02/20/14 1817 02/21/14 0436   02/20/14 1405  bacitracin 50,000 Units in sodium chloride irrigation 0.9 % 500 mL irrigation  Status:  Discontinued       As needed 02/20/14 1406 02/20/14 1543   02/20/14 0600  ceFAZolin (ANCEF) IVPB 2 g/50 mL premix     2 g100 mL/hr over 30  Minutes Intravenous On call to O.R. 02/19/14 1337 02/20/14 1320      Discharge Exam: Blood pressure 136/71, pulse 94, temperature 98.8 F (37.1 C), temperature source Oral, resp. rate 18, height 5' 1.5" (1.562 m), weight 203 lb (92.08 kg), SpO2 98 %. Neurologic: Grossly normal Incision CDI  Discharge Medications:     Medication List    TAKE these medications        ALPRAZolam 0.5 MG tablet  Commonly known as:  XANAX  Take 1 tablet (0.5 mg total) by mouth 2 (two) times daily as needed.     amitriptyline 25 MG tablet  Commonly known as:  ELAVIL  Take 1-2 tablets (25-50 mg total) by mouth at bedtime.     cetirizine 10 MG tablet  Commonly known as:  ZYRTEC  Take 10 mg by mouth daily.     cholecalciferol 1000 UNITS tablet  Commonly known as:  VITAMIN D  Take 2 tablets by mouth once daily     clotrimazole 10 MG troche  Commonly known as:  MYCELEX  Take 1 tablet (10 mg total) by mouth 5 (five) times daily.     clotrimazole-betamethasone cream  Commonly known as:  LOTRISONE  Apply topically 2 (two) times daily.     dexlansoprazole 60 MG capsule  Commonly known as:  DEXILANT  Take 1 capsule (60 mg total) by mouth daily.     diclofenac sodium 1 % Gel  Commonly known as:  VOLTAREN  Apply 4 g topically 4 (four) times daily. As directed.     estradiol 0.05 MG/24HR patch  Commonly known as:  VIVELLE-DOT  Place 1 patch (0.05 mg total) onto the skin once a week.     Fish Oil 1000 MG Caps  Take 1 capsule by mouth daily.     fluconazole 150 MG tablet  Commonly known as:  DIFLUCAN  Take 1 tablet (150 mg total) by mouth once.     Fluticasone-Salmeterol 250-50 MCG/DOSE Aepb  Commonly known as:  ADVAIR DISKUS  Inhale 1 puff into the lungs every 12 (twelve) hours.     hydrOXYzine 10 MG tablet  Commonly known as:  ATARAX/VISTARIL  Take 1 tablet (10 mg total) by mouth every 6 (six) hours as needed.     methocarbamol 750 MG tablet  Commonly known as:  ROBAXIN  Take 750 mg  by mouth daily as needed for muscle spasms.     multivitamin tablet  Take 1 tablet by mouth daily.     Oxybutynin  Chloride 10 % Gel  Commonly known as:  GELNIQUE  Use qd as dirrected     Oxycodone HCl 10 MG Tabs  Take 1 tablet (10 mg total) by mouth every 4 (four) hours as needed for moderate pain.     PARoxetine 10 MG tablet  Commonly known as:  PAXIL  TAKE 2 TABLETS BY MOUTH DAILY     verapamil 180 MG CR tablet  Commonly known as:  CALAN-SR  Take 1 tablet by mouth at bedtime.        Disposition: home   Final Dx: DLL for stenosis      Discharge Instructions     Remove dressing in 72 hours    Complete by:  As directed      Call MD for:  difficulty breathing, headache or visual disturbances    Complete by:  As directed      Call MD for:  persistant nausea and vomiting    Complete by:  As directed      Call MD for:  redness, tenderness, or signs of infection (pain, swelling, redness, odor or green/yellow discharge around incision site)    Complete by:  As directed      Call MD for:  severe uncontrolled pain    Complete by:  As directed      Call MD for:  temperature >100.4    Complete by:  As directed      Diet - low sodium heart healthy    Complete by:  As directed      Discharge instructions    Complete by:  As directed   May shower, no bending or twisting, no heavy lifting, no driving     Increase activity slowly    Complete by:  As directed               Signed: Leanny Moeckel S 02/21/2014, 11:27 AM

## 2014-03-18 ENCOUNTER — Other Ambulatory Visit: Payer: Self-pay | Admitting: Internal Medicine

## 2014-03-21 ENCOUNTER — Telehealth: Payer: Self-pay | Admitting: Internal Medicine

## 2014-03-21 MED ORDER — PAROXETINE HCL 10 MG PO TABS: 20.0000 mg | ORAL_TABLET | Freq: Every day | ORAL | Status: DC

## 2014-03-21 NOTE — Telephone Encounter (Signed)
[facility] pls advise on alprazolam.../lmb

## 2014-03-21 NOTE — Telephone Encounter (Signed)
Caller name: Audrina Relation to pt: self Call back number: [phone] Pharmacy:  Reason for call:   Patient requesting that we send a refill of alprazolam and PARoxetine (PAXIL) to be sent to [facility]. Fax [phone]

## 2014-03-22 MED ORDER — ALPRAZOLAM 0.5 MG PO TABS: 0.5000 mg | ORAL_TABLET | Freq: Two times a day (BID) | ORAL | Status: DC | PRN

## 2014-03-22 NOTE — Telephone Encounter (Signed)
.  OK to ref 6 mo Thx

## 2014-03-22 NOTE — Telephone Encounter (Signed)
Called pt spoke with husband inform rx's faxed to mail order...[name]

## 2014-03-29 ENCOUNTER — Telehealth: Payer: Self-pay | Admitting: Internal Medicine

## 2014-03-29 MED ORDER — VERAPAMIL HCL ER 180 MG PO CP24: 180.0000 mg | ORAL_CAPSULE | Freq: Every day | ORAL | Status: DC

## 2014-03-29 NOTE — Telephone Encounter (Signed)
Pt called in said that she needs a 90 day supply of verapamil (CALAN-SR) 180 MG CR tablet [[id number]] [facility] to [facility]

## 2014-04-02 ENCOUNTER — Other Ambulatory Visit: Payer: Self-pay | Admitting: Geriatric Medicine

## 2014-04-02 MED ORDER — VERAPAMIL HCL ER 180 MG PO TBCR: 180.0000 mg | EXTENDED_RELEASE_TABLET | Freq: Every day | ORAL | Status: DC

## 2014-04-17 ENCOUNTER — Telehealth: Payer: Self-pay | Admitting: *Deleted

## 2014-04-17 ENCOUNTER — Telehealth: Payer: Self-pay

## 2014-04-17 NOTE — Telephone Encounter (Signed)
Completed and mailed patient's prescription patient assistant paperwork.

## 2014-04-17 NOTE — Telephone Encounter (Signed)
Please give the pt a call concerning your class...thanks 

## 2014-06-15 ENCOUNTER — Encounter: Payer: Self-pay | Admitting: Internal Medicine

## 2014-06-15 ENCOUNTER — Ambulatory Visit (INDEPENDENT_AMBULATORY_CARE_PROVIDER_SITE_OTHER): Payer: Medicare Other | Admitting: Internal Medicine

## 2014-06-15 ENCOUNTER — Other Ambulatory Visit (INDEPENDENT_AMBULATORY_CARE_PROVIDER_SITE_OTHER): Payer: Medicare Other

## 2014-06-15 VITALS — BP 120/88 | HR 115 | Wt 198.0 lb

## 2014-06-15 DIAGNOSIS — M5441 Lumbago with sciatica, right side: Secondary | ICD-10-CM

## 2014-06-15 DIAGNOSIS — M25561 Pain in right knee: Secondary | ICD-10-CM | POA: Diagnosis not present

## 2014-06-15 DIAGNOSIS — I10 Essential (primary) hypertension: Secondary | ICD-10-CM

## 2014-06-15 DIAGNOSIS — J4520 Mild intermittent asthma, uncomplicated: Secondary | ICD-10-CM | POA: Diagnosis not present

## 2014-06-15 LAB — BASIC METABOLIC PANEL
BUN: 18 mg/dL (ref 6–23)
CALCIUM: 9.4 mg/dL (ref 8.4–10.5)
CHLORIDE: 105 meq/L (ref 96–112)
CO2: 31 mEq/L (ref 19–32)
CREATININE: 1.04 mg/dL (ref 0.40–1.20)
GFR: 67.67 mL/min (ref >60.00)
Glucose, Bld: 93 mg/dL (ref 70–99)
Potassium: 3.9 mEq/L (ref 3.5–5.1)
Sodium: 141 mEq/L (ref 135–145)

## 2014-06-15 NOTE — Assessment & Plan Note (Signed)
Dr D Jones/Dr Maryjean Ka Chronic    a laminectomy/diskectomy on 02/20/14 - doing well

## 2014-06-15 NOTE — Assessment & Plan Note (Signed)
Advair 

## 2014-06-15 NOTE — Progress Notes (Signed)
Pre visit review using our clinic review tool, if applicable. No additional management support is needed unless otherwise documented below in the visit note. 

## 2014-06-15 NOTE — Assessment & Plan Note (Signed)
Chronic Verapamil

## 2014-06-15 NOTE — Assessment & Plan Note (Signed)
Appt w/Dr Aluisio next week

## 2014-06-15 NOTE — Progress Notes (Signed)
Subjective:    HPI   F/u neck pain - better; f/u LBP. She saw Dr Ronnald Ramp and had a laminectomy/diskectomy on 02/20/14 - doing well... F/u bad hot flashes F/u on hot flashes and urinary incontinence Her dad is in a NH now  Her son died last year - 69 yo  The patient needs to address  chronic hypertension that has been well controlled with medicines; to address chronic  hyperlipidemia controlled with medicines as well; and to address type 2 chronic diabetes, controlled with medical treatment and diet.  R TKR Dr Magda Bernheim in [place] 2/13. She had a manipulation x2 - it did not work... In group PT. Pt has an appt w/Dr Aluisio next week  LBP is better - had injections   BP Readings from Last 3 Encounters:  06/15/14 120/88  02/21/14 136/71  02/09/14 140/90   Wt Readings from Last 3 Encounters:  06/15/14 198 lb (89.812 kg)  02/20/14 203 lb (92.08 kg)  02/09/14 203 lb (92.08 kg)      Review of Systems  Constitutional: Negative.  Negative for chills, diaphoresis, activity change, appetite change, fatigue and unexpected weight change.  HENT: Negative for congestion, ear pain, facial swelling, hearing loss, mouth sores, nosebleeds, postnasal drip, rhinorrhea, sinus pressure, sneezing, sore throat and tinnitus.   Eyes: Negative for pain, discharge, redness, itching and visual disturbance.  Respiratory: Negative for cough, chest tightness, shortness of breath, wheezing and stridor.   Cardiovascular: Negative for palpitations and leg swelling.  Gastrointestinal: Negative for nausea, diarrhea, constipation, blood in stool, abdominal distention, anal bleeding and rectal pain.  Genitourinary: Negative for dysuria, urgency, frequency, hematuria, flank pain, vaginal bleeding, vaginal discharge, difficulty urinating, genital sores and pelvic pain.  Musculoskeletal: Positive for arthralgias and gait problem. Negative for back pain, joint swelling and neck stiffness.       R knee pain  Skin:  Negative.  Negative for rash.  Neurological: Negative for dizziness, tremors, seizures, syncope and speech difficulty.  Hematological: Negative for adenopathy. Does not bruise/bleed easily.  Psychiatric/Behavioral: Positive for sleep disturbance. Negative for suicidal ideas, behavioral problems, dysphoric mood and decreased concentration. The patient is not nervous/anxious.        Objective:   Physical Exam  Constitutional: She appears well-developed. No distress.  HENT:  Head: Normocephalic.  Right Ear: External ear normal.  Left Ear: External ear normal.  Nose: Nose normal.  Mouth/Throat: Oropharynx is clear and moist.  Eyes: Conjunctivae are normal. Pupils are equal, round, and reactive to light. Right eye exhibits no discharge. Left eye exhibits no discharge.  Neck: Normal range of motion. Neck supple. No JVD present. No tracheal deviation present. No thyromegaly present.  Cardiovascular: Normal rate, regular rhythm and normal heart sounds.   Pulmonary/Chest: No stridor. No respiratory distress. She has no wheezes.  Abdominal: Soft. Bowel sounds are normal. She exhibits no distension and no mass. There is no tenderness. There is no rebound and no guarding.  Musculoskeletal: She exhibits no edema or tenderness.  Lymphadenopathy:    She has no cervical adenopathy.  Neurological: She displays normal reflexes. No cranial nerve deficit. She exhibits normal muscle tone. Coordination normal.  Skin: No rash noted. No erythema.  Psychiatric: She has a normal mood and affect. Her behavior is normal. Judgment and thought content normal.  Deformed gait/cane L shoulder is 1 inch higher Neck ROM is ok, tender Pt is using a cane LS is not tender R knee is stiff    Lab Results  Component  Value Date   WBC 10.9* 02/07/2014   HGB 12.3 02/07/2014   HCT 38.4 02/07/2014   PLT 556* 02/07/2014   GLUCOSE 77 02/07/2014   CHOL 241* 08/21/2013   TRIG 60.0 08/21/2013   HDL 82.10 08/21/2013    LDLDIRECT 109.8 02/10/2010   LDLCALC 147* 08/21/2013   ALT 14 08/21/2013   AST 19 08/21/2013   NA 140 02/07/2014   K 3.7 02/07/2014   CL 99 02/07/2014   CREATININE 1.01 02/07/2014   BUN 16 02/07/2014   CO2 28 02/07/2014   TSH 0.70 08/21/2013   INR 0.97 02/07/2014   HGBA1C 6.1 08/21/2013          Assessment & Plan:

## 2014-10-11 ENCOUNTER — Encounter: Payer: Self-pay | Admitting: Internal Medicine

## 2014-10-11 ENCOUNTER — Encounter: Payer: Self-pay | Admitting: Pulmonary Disease

## 2014-10-15 ENCOUNTER — Ambulatory Visit (INDEPENDENT_AMBULATORY_CARE_PROVIDER_SITE_OTHER): Payer: Medicare Other | Admitting: Internal Medicine

## 2014-10-15 ENCOUNTER — Encounter: Payer: Self-pay | Admitting: Internal Medicine

## 2014-10-15 VITALS — BP 130/84 | HR 73 | Ht 61.5 in | Wt 203.0 lb

## 2014-10-15 DIAGNOSIS — J4520 Mild intermittent asthma, uncomplicated: Secondary | ICD-10-CM

## 2014-10-15 DIAGNOSIS — R32 Unspecified urinary incontinence: Secondary | ICD-10-CM

## 2014-10-15 DIAGNOSIS — Z0000 Encounter for general adult medical examination without abnormal findings: Secondary | ICD-10-CM | POA: Diagnosis not present

## 2014-10-15 DIAGNOSIS — R609 Edema, unspecified: Secondary | ICD-10-CM

## 2014-10-15 DIAGNOSIS — N393 Stress incontinence (female) (male): Secondary | ICD-10-CM

## 2014-10-15 MED ORDER — METHOCARBAMOL 750 MG PO TABS: 750.0000 mg | ORAL_TABLET | Freq: Every day | ORAL | Status: DC | PRN

## 2014-10-15 MED ORDER — HYDROCODONE-ACETAMINOPHEN 10-325 MG PO TABS: 1.0000 | ORAL_TABLET | Freq: Four times a day (QID) | ORAL | Status: DC | PRN

## 2014-10-15 MED ORDER — FUROSEMIDE 20 MG PO TABS: 20.0000 mg | ORAL_TABLET | Freq: Every day | ORAL | Status: DC | PRN

## 2014-10-15 MED ORDER — OXYBUTYNIN CHLORIDE 5 MG PO TABS: 5.0000 mg | ORAL_TABLET | Freq: Three times a day (TID) | ORAL | Status: DC | PRN

## 2014-10-15 NOTE — Progress Notes (Signed)
Subjective:  Patient ID: Mallory Klein, female    DOB: December 28, 1945  Age: 69 y.o. MRN: [id number]  CC: No chief complaint on file.   HPI Mallory Klein presents for a well exam. C/o leg swelling...  The patient needs to address  chronic hypertension that has been well controlled with medicines; to address chronic  hyperlipidemia controlled with medicines as well; and to address type 2 chronic diabetes, controlled with medical treatment   Popcorn, ordering food out...   Outpatient Prescriptions Prior to Visit  Medication Sig Dispense Refill  . ALPRAZolam (XANAX) 0.5 MG tablet Take 1 tablet (0.5 mg total) by mouth 2 (two) times daily as needed. 180 tablet 0  . cetirizine (ZYRTEC) 10 MG tablet Take 10 mg by mouth daily.      . Cholecalciferol (VITAMIN D3) 1000 UNITS tablet Take 2 tablets by mouth once daily     . dexlansoprazole (DEXILANT) 60 MG capsule Take 1 capsule (60 mg total) by mouth daily. 90 capsule 2  . diclofenac sodium (VOLTAREN) 1 % GEL Apply 4 g topically 4 (four) times daily. As directed. 200 g 3  . Fluticasone-Salmeterol (ADVAIR DISKUS) 250-50 MCG/DOSE AEPB Inhale 1 puff into the lungs every 12 (twelve) hours. 60 each 11  . Multiple Vitamin (MULTIVITAMIN) tablet Take 1 tablet by mouth daily.    . Omega-3 Fatty Acids (FISH OIL) 1000 MG CAPS Take 1 capsule by mouth daily.    [name] PARoxetine (PAXIL) 10 MG tablet Take 2 tablets (20 mg total) by mouth daily. 180 tablet 3  . verapamil (VERELAN PM) 180 MG 24 hr capsule Take 1 capsule (180 mg total) by mouth at bedtime. 90 capsule 3  . methocarbamol (ROBAXIN) 750 MG tablet Take 750 mg by mouth daily as needed for muscle spasms.     [name] oxyCODONE 10 MG TABS Take 1 tablet (10 mg total) by mouth every 4 (four) hours as needed for moderate pain. (Patient not taking: Reported on 10/15/2014) 60 tablet 0   Facility-Administered Medications Prior to Visit  Medication Dose Route Frequency Provider Last Rate Last Dose  .  methylPREDNISolone acetate (DEPO-MEDROL) injection 40 mg  40 mg Intra-articular Once Joclyn Alsobrook V, MD        ROS Review of Systems  Constitutional: Negative for chills, activity change, appetite change, fatigue and unexpected weight change.  HENT: Negative for congestion, mouth sores and sinus pressure.   Eyes: Negative for visual disturbance.  Respiratory: Negative for cough and chest tightness.   Cardiovascular: Positive for leg swelling.  Gastrointestinal: Negative for nausea and abdominal pain.  Genitourinary: Positive for urgency and frequency. Negative for difficulty urinating and vaginal pain.  Musculoskeletal: Negative for back pain and gait problem.  Skin: Negative for pallor and rash.  Neurological: Negative for dizziness, tremors, weakness, numbness and headaches.  Psychiatric/Behavioral: Negative for suicidal ideas, confusion and sleep disturbance. The patient is not nervous/anxious.     Objective:  BP 130/84 mmHg  Pulse 73  Ht 5' 1.5" (1.562 m)  Wt 203 lb (92.08 kg)  BMI 37.74 kg/m2  SpO2 95%  BP Readings from Last 3 Encounters:  10/15/14 130/84  06/15/14 120/88  02/21/14 136/71    Wt Readings from Last 3 Encounters:  10/15/14 203 lb (92.08 kg)  06/15/14 198 lb (89.812 kg)  02/20/14 203 lb (92.08 kg)    Physical Exam  Constitutional: She appears well-developed. No distress.  HENT:  Head: Normocephalic.  Right Ear: External ear normal.  Left Ear: External ear  normal.  Nose: Nose normal.  Mouth/Throat: Oropharynx is clear and moist.  Eyes: Conjunctivae are normal. Pupils are equal, round, and reactive to light. Right eye exhibits no discharge. Left eye exhibits no discharge.  Neck: Normal range of motion. Neck supple. No JVD present. No tracheal deviation present. No thyromegaly present.  Cardiovascular: Normal rate, regular rhythm and normal heart sounds.   Pulmonary/Chest: No stridor. No respiratory distress. She has no wheezes.  Abdominal: Soft.  Bowel sounds are normal. She exhibits no distension and no mass. There is no tenderness. There is no rebound and no guarding.  Musculoskeletal: She exhibits edema. She exhibits no tenderness.  Lymphadenopathy:    She has no cervical adenopathy.  Neurological: She displays normal reflexes. No cranial nerve deficit. She exhibits normal muscle tone. Coordination normal.  Skin: No rash noted. No erythema.  Psychiatric: She has a normal mood and affect. Her behavior is normal. Judgment and thought content normal.  Trace to 1+ edema B feet R knee is tender w/restricted ROM  Lab Results  Component Value Date   WBC 10.9* 02/07/2014   HGB 12.3 02/07/2014   HCT 38.4 02/07/2014   PLT 556* 02/07/2014   GLUCOSE 93 06/15/2014   CHOL 241* 08/21/2013   TRIG 60.0 08/21/2013   HDL 82.10 08/21/2013   LDLDIRECT 109.8 02/10/2010   LDLCALC 147* 08/21/2013   ALT 14 08/21/2013   AST 19 08/21/2013   NA 141 06/15/2014   K 3.9 06/15/2014   CL 105 06/15/2014   CREATININE 1.04 06/15/2014   BUN 18 06/15/2014   CO2 31 06/15/2014   TSH 0.70 08/21/2013   INR 0.97 02/07/2014   HGBA1C 6.1 08/21/2013    Chest 2 View  02/07/2014   CLINICAL DATA:  Preoperative exam.  Back surgery.  EXAM: CHEST  2 VIEW  COMPARISON:  01/19/2010  FINDINGS: The heart size and mediastinal contours are within normal limits. Both lungs are clear. The visualized skeletal structures are unremarkable.  IMPRESSION: No active cardiopulmonary disease.   Electronically Signed   By: Rolm Baptise M.D.   On: 02/07/2014 15:55    Assessment & Plan:   Diagnoses and all orders for this visit:  Well adult exam -     Lipid panel; Future -     Basic metabolic panel; Future -     CBC with Differential/Platelet; Future -     Hepatic function panel; Future -     Hemoglobin A1c; Future -     TSH; Future -     Urinalysis; Future  Edema -     Lipid panel; Future -     Basic metabolic panel; Future -     CBC with Differential/Platelet; Future -      Hepatic function panel; Future -     Hemoglobin A1c; Future -     TSH; Future -     Urinalysis; Future  Asthma, mild intermittent, uncomplicated -     Lipid panel; Future -     Basic metabolic panel; Future -     CBC with Differential/Platelet; Future -     Hepatic function panel; Future -     Hemoglobin A1c; Future -     TSH; Future -     Urinalysis; Future  OBESITY, MORBID -     Lipid panel; Future -     Basic metabolic panel; Future -     CBC with Differential/Platelet; Future -     Hepatic function panel; Future -  Hemoglobin A1c; Future -     TSH; Future -     Urinalysis; Future  Incontinence in female  Other orders -     methocarbamol (ROBAXIN) 750 MG tablet; Take 1 tablet (750 mg total) by mouth daily as needed for muscle spasms. -     HYDROcodone-acetaminophen (NORCO) 10-325 MG per tablet; Take 1 tablet by mouth every 6 (six) hours as needed. -     furosemide (LASIX) 20 MG tablet; Take 1-2 tablets (20-40 mg total) by mouth daily as needed for edema. -     oxybutynin (DITROPAN) 5 MG tablet; Take 1 tablet (5 mg total) by mouth every 8 (eight) hours as needed for bladder spasms.  I have discontinued Ms. Kraner's Oxycodone HCl. I have also changed her methocarbamol. Additionally, I am having her start on furosemide and oxybutynin. Lastly, I am having her maintain her cholecalciferol, cetirizine, Fluticasone-Salmeterol, multivitamin, dexlansoprazole, diclofenac sodium, Fish Oil, PARoxetine, ALPRAZolam, verapamil, and HYDROcodone-acetaminophen. We will continue to administer methylPREDNISolone acetate.  Meds ordered this encounter  Medications  . methocarbamol (ROBAXIN) 750 MG tablet    Sig: Take 1 tablet (750 mg total) by mouth daily as needed for muscle spasms.    Dispense:  30 tablet    Refill:  3  . DISCONTD: HYDROcodone-acetaminophen (NORCO) 10-325 MG per tablet    Sig: Take 1 tablet by mouth every 6 (six) hours as needed.  [name] HYDROcodone-acetaminophen (NORCO)  10-325 MG per tablet    Sig: Take 1 tablet by mouth every 6 (six) hours as needed.    Dispense:  120 tablet    Refill:  0  . furosemide (LASIX) 20 MG tablet    Sig: Take 1-2 tablets (20-40 mg total) by mouth daily as needed for edema.    Dispense:  60 tablet    Refill:  3  . oxybutynin (DITROPAN) 5 MG tablet    Sig: Take 1 tablet (5 mg total) by mouth every 8 (eight) hours as needed for bladder spasms.    Dispense:  90 tablet    Refill:  3     Follow-up: Return in about 4 months (around 02/14/2015) for a follow-up visit.  Walker Kehr, MD

## 2014-10-15 NOTE — Assessment & Plan Note (Signed)
Chronic 8/16 Ditropan prn  Potential benefits of a long term ditropan use as well as potential risks  and complications were explained to the patient and were aknowledged.

## 2014-10-15 NOTE — Progress Notes (Signed)
Pre visit review using our clinic review tool, if applicable. No additional management support is needed unless otherwise documented below in the visit note. 

## 2014-10-15 NOTE — Patient Instructions (Signed)
Cut back on salt  Preventive Care for Adults A healthy lifestyle and preventive care can promote health and wellness. Preventive health guidelines for women include the following key practices.  A routine yearly physical is a good way to check with your health care provider about your health and preventive screening. It is a chance to share any concerns and updates on your health and to receive a thorough exam.  Visit your dentist for a routine exam and preventive care every 6 months. Brush your teeth twice a day and floss once a day. Good oral hygiene prevents tooth decay and gum disease.  The frequency of eye exams is based on your age, health, family medical history, use of contact lenses, and other factors. Follow your health care provider's recommendations for frequency of eye exams.  Eat a healthy diet. Foods like vegetables, fruits, whole grains, low-fat dairy products, and lean protein foods contain the nutrients you need without too many calories. Decrease your intake of foods high in solid fats, added sugars, and salt. Eat the right amount of calories for you.Get information about a proper diet from your health care provider, if necessary.  Regular physical exercise is one of the most important things you can do for your health. Most adults should get at least 150 minutes of moderate-intensity exercise (any activity that increases your heart rate and causes you to sweat) each week. In addition, most adults need muscle-strengthening exercises on 2 or more days a week.  Maintain a healthy weight. The body mass index (BMI) is a screening tool to identify possible weight problems. It provides an estimate of body fat based on height and weight. Your health care provider can find your BMI and can help you achieve or maintain a healthy weight.For adults 20 years and older:  A BMI below 18.5 is considered underweight.  A BMI of 18.5 to 24.9 is normal.  A BMI of 25 to 29.9 is considered  overweight.  A BMI of 30 and above is considered obese.  Maintain normal blood lipids and cholesterol levels by exercising and minimizing your intake of saturated fat. Eat a balanced diet with plenty of fruit and vegetables. Blood tests for lipids and cholesterol should begin at age 47 and be repeated every 5 years. If your lipid or cholesterol levels are high, you are over 50, or you are at high risk for heart disease, you may need your cholesterol levels checked more frequently.Ongoing high lipid and cholesterol levels should be treated with medicines if diet and exercise are not working.  If you smoke, find out from your health care provider how to quit. If you do not use tobacco, do not start.  Lung cancer screening is recommended for adults aged 34-80 years who are at high risk for developing lung cancer because of a history of smoking. A yearly low-dose CT scan of the lungs is recommended for people who have at least a 30-pack-year history of smoking and are a current smoker or have quit within the past 15 years. A pack year of smoking is smoking an average of 1 pack of cigarettes a day for 1 year (for example: 1 pack a day for 30 years or 2 packs a day for 15 years). Yearly screening should continue until the smoker has stopped smoking for at least 15 years. Yearly screening should be stopped for people who develop a health problem that would prevent them from having lung cancer treatment.  If you are pregnant, do not drink  alcohol. If you are breastfeeding, be very cautious about drinking alcohol. If you are not pregnant and choose to drink alcohol, do not have more than 1 drink per day. One drink is considered to be 12 ounces (355 mL) of beer, 5 ounces (148 mL) of wine, or 1.5 ounces (44 mL) of liquor.  Avoid use of street drugs. Do not share needles with anyone. Ask for help if you need support or instructions about stopping the use of drugs.  High blood pressure causes heart disease and  increases the risk of stroke. Your blood pressure should be checked at least every 1 to 2 years. Ongoing high blood pressure should be treated with medicines if weight loss and exercise do not work.  If you are 18-29 years old, ask your health care provider if you should take aspirin to prevent strokes.  Diabetes screening involves taking a blood sample to check your fasting blood sugar level. This should be done once every 3 years, after age 76, if you are within normal weight and without risk factors for diabetes. Testing should be considered at a younger age or be carried out more frequently if you are overweight and have at least 1 risk factor for diabetes.  Breast cancer screening is essential preventive care for women. You should practice "breast self-awareness." This means understanding the normal appearance and feel of your breasts and may include breast self-examination. Any changes detected, no matter how small, should be reported to a health care provider. Women in their 65s and 30s should have a clinical breast exam (CBE) by a health care provider as part of a regular health exam every 1 to 3 years. After age 69, women should have a CBE every year. Starting at age 50, women should consider having a mammogram (breast X-ray test) every year. Women who have a family history of breast cancer should talk to their health care provider about genetic screening. Women at a high risk of breast cancer should talk to their health care providers about having an MRI and a mammogram every year.  Breast cancer gene (BRCA)-related cancer risk assessment is recommended for women who have family members with BRCA-related cancers. BRCA-related cancers include breast, ovarian, tubal, and peritoneal cancers. Having family members with these cancers may be associated with an increased risk for harmful changes (mutations) in the breast cancer genes BRCA1 and BRCA2. Results of the assessment will determine the need for  genetic counseling and BRCA1 and BRCA2 testing.  Routine pelvic exams to screen for cancer are no longer recommended for nonpregnant women who are considered low risk for cancer of the pelvic organs (ovaries, uterus, and vagina) and who do not have symptoms. Ask your health care provider if a screening pelvic exam is right for you.  If you have had past treatment for cervical cancer or a condition that could lead to cancer, you need Pap tests and screening for cancer for at least 20 years after your treatment. If Pap tests have been discontinued, your risk factors (such as having a new sexual partner) need to be reassessed to determine if screening should be resumed. Some women have medical problems that increase the chance of getting cervical cancer. In these cases, your health care provider may recommend more frequent screening and Pap tests.  The HPV test is an additional test that may be used for cervical cancer screening. The HPV test looks for the virus that can cause the cell changes on the cervix. The cells collected during  the Pap test can be tested for HPV. The HPV test could be used to screen women aged 29 years and older, and should be used in women of any age who have unclear Pap test results. After the age of 54, women should have HPV testing at the same frequency as a Pap test.  Colorectal cancer can be detected and often prevented. Most routine colorectal cancer screening begins at the age of 91 years and continues through age 65 years. However, your health care provider may recommend screening at an earlier age if you have risk factors for colon cancer. On a yearly basis, your health care provider may provide home test kits to check for hidden blood in the stool. Use of a small camera at the end of a tube, to directly examine the colon (sigmoidoscopy or colonoscopy), can detect the earliest forms of colorectal cancer. Talk to your health care provider about this at age 58, when routine  screening begins. Direct exam of the colon should be repeated every 5-10 years through age 61 years, unless early forms of pre-cancerous polyps or small growths are found.  People who are at an increased risk for hepatitis B should be screened for this virus. You are considered at high risk for hepatitis B if:  You were born in a country where hepatitis B occurs often. Talk with your health care provider about which countries are considered high risk.  Your parents were born in a high-risk country and you have not received a shot to protect against hepatitis B (hepatitis B vaccine).  You have HIV or AIDS.  You use needles to inject street drugs.  You live with, or have sex with, someone who has hepatitis B.  You get hemodialysis treatment.  You take certain medicines for conditions like cancer, organ transplantation, and autoimmune conditions.  Hepatitis C blood testing is recommended for all people born from [birth year] through 1965 and any individual with known risks for hepatitis C.  Practice safe sex. Use condoms and avoid high-risk sexual practices to reduce the spread of sexually transmitted infections (STIs). STIs include gonorrhea, chlamydia, syphilis, trichomonas, herpes, HPV, and human immunodeficiency virus (HIV). Herpes, HIV, and HPV are viral illnesses that have no cure. They can result in disability, cancer, and death.  You should be screened for sexually transmitted illnesses (STIs) including gonorrhea and chlamydia if:  You are sexually active and are younger than 24 years.  You are older than 24 years and your health care provider tells you that you are at risk for this type of infection.  Your sexual activity has changed since you were last screened and you are at an increased risk for chlamydia or gonorrhea. Ask your health care provider if you are at risk.  If you are at risk of being infected with HIV, it is recommended that you take a prescription medicine daily to  prevent HIV infection. This is called preexposure prophylaxis (PrEP). You are considered at risk if:  You are a heterosexual woman, are sexually active, and are at increased risk for HIV infection.  You take drugs by injection.  You are sexually active with a partner who has HIV.  Talk with your health care provider about whether you are at high risk of being infected with HIV. If you choose to begin PrEP, you should first be tested for HIV. You should then be tested every 3 months for as long as you are taking PrEP.  Osteoporosis is a disease in which the  bones lose minerals and strength with aging. This can result in serious bone fractures or breaks. The risk of osteoporosis can be identified using a bone density scan. Women ages 48 years and over and women at risk for fractures or osteoporosis should discuss screening with their health care providers. Ask your health care provider whether you should take a calcium supplement or vitamin D to reduce the rate of osteoporosis.  Menopause can be associated with physical symptoms and risks. Hormone replacement therapy is available to decrease symptoms and risks. You should talk to your health care provider about whether hormone replacement therapy is right for you.  Use sunscreen. Apply sunscreen liberally and repeatedly throughout the day. You should seek shade when your shadow is shorter than you. Protect yourself by wearing long sleeves, pants, a wide-brimmed hat, and sunglasses year round, whenever you are outdoors.  Once a month, do a whole body skin exam, using a mirror to look at the skin on your back. Tell your health care provider of new moles, moles that have irregular borders, moles that are larger than a pencil eraser, or moles that have changed in shape or color.  Stay current with required vaccines (immunizations).  Influenza vaccine. All adults should be immunized every year.  Tetanus, diphtheria, and acellular pertussis (Td, Tdap)  vaccine. Pregnant women should receive 1 dose of Tdap vaccine during each pregnancy. The dose should be obtained regardless of the length of time since the last dose. Immunization is preferred during the 27th-36th week of gestation. An adult who has not previously received Tdap or who does not know her vaccine status should receive 1 dose of Tdap. This initial dose should be followed by tetanus and diphtheria toxoids (Td) booster doses every 10 years. Adults with an unknown or incomplete history of completing a 3-dose immunization series with Td-containing vaccines should begin or complete a primary immunization series including a Tdap dose. Adults should receive a Td booster every 10 years.  Varicella vaccine. An adult without evidence of immunity to varicella should receive 2 doses or a second dose if she has previously received 1 dose. Pregnant females who do not have evidence of immunity should receive the first dose after pregnancy. This first dose should be obtained before leaving the health care facility. The second dose should be obtained 4-8 weeks after the first dose.  Human papillomavirus (HPV) vaccine. Females aged 13-26 years who have not received the vaccine previously should obtain the 3-dose series. The vaccine is not recommended for use in pregnant females. However, pregnancy testing is not needed before receiving a dose. If a female is found to be pregnant after receiving a dose, no treatment is needed. In that case, the remaining doses should be delayed until after the pregnancy. Immunization is recommended for any person with an immunocompromised condition through the age of 6 years if she did not get any or all doses earlier. During the 3-dose series, the second dose should be obtained 4-8 weeks after the first dose. The third dose should be obtained 24 weeks after the first dose and 16 weeks after the second dose.  Zoster vaccine. One dose is recommended for adults aged 63 years or older  unless certain conditions are present.  Measles, mumps, and rubella (MMR) vaccine. Adults born before [birth year] generally are considered immune to measles and mumps. Adults born in [birth year] or later should have 1 or more doses of MMR vaccine unless there is a contraindication to the vaccine or there is  laboratory evidence of immunity to each of the three diseases. A routine second dose of MMR vaccine should be obtained at least 28 days after the first dose for students attending postsecondary schools, health care workers, or international travelers. People who received inactivated measles vaccine or an unknown type of measles vaccine during 1963-1967 should receive 2 doses of MMR vaccine. People who received inactivated mumps vaccine or an unknown type of mumps vaccine before 1979 and are at high risk for mumps infection should consider immunization with 2 doses of MMR vaccine. For females of childbearing age, rubella immunity should be determined. If there is no evidence of immunity, females who are not pregnant should be vaccinated. If there is no evidence of immunity, females who are pregnant should delay immunization until after pregnancy. Unvaccinated health care workers born before [birth year] who lack laboratory evidence of measles, mumps, or rubella immunity or laboratory confirmation of disease should consider measles and mumps immunization with 2 doses of MMR vaccine or rubella immunization with 1 dose of MMR vaccine.  Pneumococcal 13-valent conjugate (PCV13) vaccine. When indicated, a person who is uncertain of her immunization history and has no record of immunization should receive the PCV13 vaccine. An adult aged 5 years or older who has certain medical conditions and has not been previously immunized should receive 1 dose of PCV13 vaccine. This PCV13 should be followed with a dose of pneumococcal polysaccharide (PPSV23) vaccine. The PPSV23 vaccine dose should be obtained at least 8 weeks after the dose of PCV13  vaccine. An adult aged 45 years or older who has certain medical conditions and previously received 1 or more doses of PPSV23 vaccine should receive 1 dose of PCV13. The PCV13 vaccine dose should be obtained 1 or more years after the last PPSV23 vaccine dose.  Pneumococcal polysaccharide (PPSV23) vaccine. When PCV13 is also indicated, PCV13 should be obtained first. All adults aged 66 years and older should be immunized. An adult younger than age 57 years who has certain medical conditions should be immunized. Any person who resides in a nursing home or long-term care facility should be immunized. An adult smoker should be immunized. People with an immunocompromised condition and certain other conditions should receive both PCV13 and PPSV23 vaccines. People with human immunodeficiency virus (HIV) infection should be immunized as soon as possible after diagnosis. Immunization during chemotherapy or radiation therapy should be avoided. Routine use of PPSV23 vaccine is not recommended for American Indians, [place] Natives, or people younger than 65 years unless there are medical conditions that require PPSV23 vaccine. When indicated, people who have unknown immunization and have no record of immunization should receive PPSV23 vaccine. One-time revaccination 5 years after the first dose of PPSV23 is recommended for people aged 19-64 years who have chronic kidney failure, nephrotic syndrome, asplenia, or immunocompromised conditions. People who received 1-2 doses of PPSV23 before age 47 years should receive another dose of PPSV23 vaccine at age 82 years or later if at least 5 years have passed since the previous dose. Doses of PPSV23 are not needed for people immunized with PPSV23 at or after age 40 years.  Meningococcal vaccine. Adults with asplenia or persistent complement component deficiencies should receive 2 doses of quadrivalent meningococcal conjugate (MenACWY-D) vaccine. The doses should be obtained at least  2 months apart. Microbiologists working with certain meningococcal bacteria, [place] recruits, people at risk during an outbreak, and people who travel to or live in countries with a high rate of meningitis should be immunized. A first-year  college student up through age 63 years who is living in a residence hall should receive a dose if she did not receive a dose on or after her 16th birthday. Adults who have certain high-risk conditions should receive one or more doses of vaccine.  Hepatitis A vaccine. Adults who wish to be protected from this disease, have certain high-risk conditions, work with hepatitis A-infected animals, work in hepatitis A research labs, or travel to or work in countries with a high rate of hepatitis A should be immunized. Adults who were previously unvaccinated and who anticipate close contact with an international adoptee during the first 60 days after arrival in the [place] from a country with a high rate of hepatitis A should be immunized.  Hepatitis B vaccine. Adults who wish to be protected from this disease, have certain high-risk conditions, may be exposed to blood or other infectious body fluids, are household contacts or sex partners of hepatitis B positive people, are clients or workers in certain care facilities, or travel to or work in countries with a high rate of hepatitis B should be immunized.  Haemophilus influenzae type b (Hib) vaccine. A previously unvaccinated person with asplenia or sickle cell disease or having a scheduled splenectomy should receive 1 dose of Hib vaccine. Regardless of previous immunization, a recipient of a hematopoietic stem cell transplant should receive a 3-dose series 6-12 months after her successful transplant. Hib vaccine is not recommended for adults with HIV infection. Preventive Services / Frequency Ages 61 to 31 years  Blood pressure check.** / Every 1 to 2 years.  Lipid and cholesterol check.** / Every 5 years beginning  at age 48.  Clinical breast exam.** / Every 3 years for women in their 3s and 59s.  BRCA-related cancer risk assessment.** / For women who have family members with a BRCA-related cancer (breast, ovarian, tubal, or peritoneal cancers).  Pap test.** / Every 2 years from ages 26 through 71. Every 3 years starting at age 23 through age 14 or 65 with a history of 3 consecutive normal Pap tests.  HPV screening.** / Every 3 years from ages 37 through ages 54 to 31 with a history of 3 consecutive normal Pap tests.  Hepatitis C blood test.** / For any individual with known risks for hepatitis C.  Skin self-exam. / Monthly.  Influenza vaccine. / Every year.  Tetanus, diphtheria, and acellular pertussis (Tdap, Td) vaccine.** / Consult your health care provider. Pregnant women should receive 1 dose of Tdap vaccine during each pregnancy. 1 dose of Td every 10 years.  Varicella vaccine.** / Consult your health care provider. Pregnant females who do not have evidence of immunity should receive the first dose after pregnancy.  HPV vaccine. / 3 doses over 6 months, if 65 and younger. The vaccine is not recommended for use in pregnant females. However, pregnancy testing is not needed before receiving a dose.  Measles, mumps, rubella (MMR) vaccine.** / You need at least 1 dose of MMR if you were born in 1957 or later. You may also need a 2nd dose. For females of childbearing age, rubella immunity should be determined. If there is no evidence of immunity, females who are not pregnant should be vaccinated. If there is no evidence of immunity, females who are pregnant should delay immunization until after pregnancy.  Pneumococcal 13-valent conjugate (PCV13) vaccine.** / Consult your health care provider.  Pneumococcal polysaccharide (PPSV23) vaccine.** / 1 to 2 doses if you smoke cigarettes or if you have  certain conditions.  Meningococcal vaccine.** / 1 dose if you are age 18 to 4 years and a Occupational psychologist living in a residence hall, or have one of several medical conditions, you need to get vaccinated against meningococcal disease. You may also need additional booster doses.  Hepatitis A vaccine.** / Consult your health care provider.  Hepatitis B vaccine.** / Consult your health care provider.  Haemophilus influenzae type b (Hib) vaccine.** / Consult your health care provider. Ages 51 to 70 years  Blood pressure check.** / Every 1 to 2 years.  Lipid and cholesterol check.** / Every 5 years beginning at age 46 years.  Lung cancer screening. / Every year if you are aged 75-80 years and have a 30-pack-year history of smoking and currently smoke or have quit within the past 15 years. Yearly screening is stopped once you have quit smoking for at least 15 years or develop a health problem that would prevent you from having lung cancer treatment.  Clinical breast exam.** / Every year after age 9 years.  BRCA-related cancer risk assessment.** / For women who have family members with a BRCA-related cancer (breast, ovarian, tubal, or peritoneal cancers).  Mammogram.** / Every year beginning at age 64 years and continuing for as long as you are in good health. Consult with your health care provider.  Pap test.** / Every 3 years starting at age 42 years through age 54 or 62 years with a history of 3 consecutive normal Pap tests.  HPV screening.** / Every 3 years from ages 35 years through ages 50 to 63 years with a history of 3 consecutive normal Pap tests.  Fecal occult blood test (FOBT) of stool. / Every year beginning at age 66 years and continuing until age 14 years. You may not need to do this test if you get a colonoscopy every 10 years.  Flexible sigmoidoscopy or colonoscopy.** / Every 5 years for a flexible sigmoidoscopy or every 10 years for a colonoscopy beginning at age 26 years and continuing until age 81 years.  Hepatitis C blood test.** / For all people born from [birth year]  through 1965 and any individual with known risks for hepatitis C.  Skin self-exam. / Monthly.  Influenza vaccine. / Every year.  Tetanus, diphtheria, and acellular pertussis (Tdap/Td) vaccine.** / Consult your health care provider. Pregnant women should receive 1 dose of Tdap vaccine during each pregnancy. 1 dose of Td every 10 years.  Varicella vaccine.** / Consult your health care provider. Pregnant females who do not have evidence of immunity should receive the first dose after pregnancy.  Zoster vaccine.** / 1 dose for adults aged 59 years or older.  Measles, mumps, rubella (MMR) vaccine.** / You need at least 1 dose of MMR if you were born in 1957 or later. You may also need a 2nd dose. For females of childbearing age, rubella immunity should be determined. If there is no evidence of immunity, females who are not pregnant should be vaccinated. If there is no evidence of immunity, females who are pregnant should delay immunization until after pregnancy.  Pneumococcal 13-valent conjugate (PCV13) vaccine.** / Consult your health care provider.  Pneumococcal polysaccharide (PPSV23) vaccine.** / 1 to 2 doses if you smoke cigarettes or if you have certain conditions.  Meningococcal vaccine.** / Consult your health care provider.  Hepatitis A vaccine.** / Consult your health care provider.  Hepatitis B vaccine.** / Consult your health care provider.  Haemophilus influenzae type b (Hib) vaccine.** / Consult  your health care provider. Ages 14 years and over  Blood pressure check.** / Every 1 to 2 years.  Lipid and cholesterol check.** / Every 5 years beginning at age 28 years.  Lung cancer screening. / Every year if you are aged 66-80 years and have a 30-pack-year history of smoking and currently smoke or have quit within the past 15 years. Yearly screening is stopped once you have quit smoking for at least 15 years or develop a health problem that would prevent you from having lung cancer  treatment.  Clinical breast exam.** / Every year after age 47 years.  BRCA-related cancer risk assessment.** / For women who have family members with a BRCA-related cancer (breast, ovarian, tubal, or peritoneal cancers).  Mammogram.** / Every year beginning at age 90 years and continuing for as long as you are in good health. Consult with your health care provider.  Pap test.** / Every 3 years starting at age 30 years through age 16 or 74 years with 3 consecutive normal Pap tests. Testing can be stopped between 65 and 70 years with 3 consecutive normal Pap tests and no abnormal Pap or HPV tests in the past 10 years.  HPV screening.** / Every 3 years from ages 63 years through ages 65 or 20 years with a history of 3 consecutive normal Pap tests. Testing can be stopped between 65 and 70 years with 3 consecutive normal Pap tests and no abnormal Pap or HPV tests in the past 10 years.  Fecal occult blood test (FOBT) of stool. / Every year beginning at age 49 years and continuing until age 89 years. You may not need to do this test if you get a colonoscopy every 10 years.  Flexible sigmoidoscopy or colonoscopy.** / Every 5 years for a flexible sigmoidoscopy or every 10 years for a colonoscopy beginning at age 24 years and continuing until age 70 years.  Hepatitis C blood test.** / For all people born from [birth year] through 1965 and any individual with known risks for hepatitis C.  Osteoporosis screening.** / A one-time screening for women ages 39 years and over and women at risk for fractures or osteoporosis.  Skin self-exam. / Monthly.  Influenza vaccine. / Every year.  Tetanus, diphtheria, and acellular pertussis (Tdap/Td) vaccine.** / 1 dose of Td every 10 years.  Varicella vaccine.** / Consult your health care provider.  Zoster vaccine.** / 1 dose for adults aged 10 years or older.  Pneumococcal 13-valent conjugate (PCV13) vaccine.** / Consult your health care provider.  Pneumococcal  polysaccharide (PPSV23) vaccine.** / 1 dose for all adults aged 8 years and older.  Meningococcal vaccine.** / Consult your health care provider.  Hepatitis A vaccine.** / Consult your health care provider.  Hepatitis B vaccine.** / Consult your health care provider.  Haemophilus influenzae type b (Hib) vaccine.** / Consult your health care provider. ** Family history and personal history of risk and conditions may change your health care provider's recommendations. Document Released: 04/07/2001 Document Revised: 06/26/2013 Document Reviewed: 07/07/2010 [facility] Patient Information 2015 [place], [place]. This information is not intended to replace advice given to you by your health care provider. Make sure you discuss any questions you have with your health care provider.

## 2014-10-15 NOTE — Assessment & Plan Note (Signed)
Advair 

## 2014-10-15 NOTE — Assessment & Plan Note (Addendum)
Here for medicare wellness/physical  Diet: heart healthy  Physical activity: sedentary  Depression/mood screen: negative  Hearing: intact to whispered voice  Visual acuity: grossly normal, performs annual eye exam  ADLs: capable  Fall risk: moderate Home safety: good  Cognitive evaluation: intact to orientation, naming, recall and repetition  EOL planning: adv directives, full code/ I agree  I have personally reviewed and have noted  1. The patient's medical, surgical and social history  2. Their use of alcohol, tobacco or illicit drugs  3. Their current medications and supplements  4. The patient's functional ability including ADL's, fall risks, home safety risks and hearing or visual impairment.  5. Diet and physical activities  6. Evidence for depression or mood disorders 7. The roster of all physicians providing medical care to patient - is listed in the Snapshot section of the chart and reviewed today.    Today patient counseled on age appropriate routine health concerns for screening and prevention, each reviewed and up to date or declined. Immunizations reviewed and up to date or declined. Labs ordered and reviewed. Risk factors for depression reviewed and negative. Hearing function and visual acuity are intact. ADLs screened and addressed as needed. Functional ability and level of safety reviewed and appropriate. Education, counseling and referrals performed based on assessed risks today. Patient provided with a copy of personalized plan for preventive services.     Labs

## 2014-10-15 NOTE — Assessment & Plan Note (Addendum)
8/16 ?verapamil and salt related Lasix prn NAS diet

## 2014-10-15 NOTE — Assessment & Plan Note (Signed)
Wt Readings from Last 3 Encounters:  10/15/14 203 lb (92.08 kg)  06/15/14 198 lb (89.812 kg)  02/20/14 203 lb (92.08 kg)

## 2014-11-29 ENCOUNTER — Encounter: Payer: Self-pay | Admitting: Internal Medicine

## 2014-11-30 ENCOUNTER — Telehealth: Payer: Self-pay | Admitting: *Deleted

## 2014-11-30 MED ORDER — PROMETHAZINE-CODEINE 6.25-10 MG/5ML PO SYRP: 5.0000 mL | ORAL_SOLUTION | Freq: Four times a day (QID) | ORAL | Status: DC | PRN

## 2014-11-30 NOTE — Telephone Encounter (Signed)
Cough syrup called to [facility] spoke with Clarise Cruz gave md authorization...[name]

## 2014-11-30 NOTE — Telephone Encounter (Signed)
Notified pt md ok prom-cod cough syrup has been call to pharmacy....Mallory Klein

## 2014-12-14 ENCOUNTER — Encounter: Payer: Self-pay | Admitting: Internal Medicine

## 2015-02-05 ENCOUNTER — Other Ambulatory Visit: Payer: Medicare Other

## 2015-02-05 ENCOUNTER — Encounter: Payer: Self-pay | Admitting: Internal Medicine

## 2015-02-05 ENCOUNTER — Ambulatory Visit (INDEPENDENT_AMBULATORY_CARE_PROVIDER_SITE_OTHER): Payer: Medicare Other | Admitting: Internal Medicine

## 2015-02-05 ENCOUNTER — Other Ambulatory Visit (INDEPENDENT_AMBULATORY_CARE_PROVIDER_SITE_OTHER): Payer: Medicare Other

## 2015-02-05 VITALS — BP 100/60 | HR 103 | Wt 204.0 lb

## 2015-02-05 DIAGNOSIS — J4520 Mild intermittent asthma, uncomplicated: Secondary | ICD-10-CM

## 2015-02-05 DIAGNOSIS — R609 Edema, unspecified: Secondary | ICD-10-CM

## 2015-02-05 DIAGNOSIS — R7989 Other specified abnormal findings of blood chemistry: Secondary | ICD-10-CM | POA: Insufficient documentation

## 2015-02-05 DIAGNOSIS — I10 Essential (primary) hypertension: Secondary | ICD-10-CM | POA: Diagnosis not present

## 2015-02-05 DIAGNOSIS — Z0000 Encounter for general adult medical examination without abnormal findings: Secondary | ICD-10-CM

## 2015-02-05 DIAGNOSIS — M5440 Lumbago with sciatica, unspecified side: Secondary | ICD-10-CM

## 2015-02-05 DIAGNOSIS — M159 Polyosteoarthritis, unspecified: Secondary | ICD-10-CM

## 2015-02-05 DIAGNOSIS — Z23 Encounter for immunization: Secondary | ICD-10-CM | POA: Diagnosis not present

## 2015-02-05 DIAGNOSIS — M8949 Other hypertrophic osteoarthropathy, multiple sites: Secondary | ICD-10-CM

## 2015-02-05 DIAGNOSIS — R21 Rash and other nonspecific skin eruption: Secondary | ICD-10-CM

## 2015-02-05 DIAGNOSIS — F4323 Adjustment disorder with mixed anxiety and depressed mood: Secondary | ICD-10-CM

## 2015-02-05 DIAGNOSIS — M150 Primary generalized (osteo)arthritis: Secondary | ICD-10-CM

## 2015-02-05 HISTORY — DX: Other specified abnormal findings of blood chemistry: R79.89

## 2015-02-05 LAB — BASIC METABOLIC PANEL
BUN: 14 mg/dL (ref 6–23)
CO2: 31 mEq/L (ref 19–32)
Calcium: 9.2 mg/dL (ref 8.4–10.5)
Chloride: 103 mEq/L (ref 96–112)
Creatinine, Ser: 1.11 mg/dL (ref 0.40–1.20)
GFR: 62.65 mL/min (ref >60.00)
GLUCOSE: 106 mg/dL — AB (ref 70–99)
POTASSIUM: 4.4 meq/L (ref 3.5–5.1)
Sodium: 141 mEq/L (ref 135–145)

## 2015-02-05 LAB — URINALYSIS, ROUTINE W REFLEX MICROSCOPIC
Hgb urine dipstick: NEGATIVE
LEUKOCYTES UA: NEGATIVE
NITRITE: NEGATIVE
PH: 5.5 (ref 5.0–8.0)
Total Protein, Urine: 30 — AB
URINE GLUCOSE: NEGATIVE
Urobilinogen, UA: 1 (ref 0.0–1.0)

## 2015-02-05 LAB — CBC WITH DIFFERENTIAL/PLATELET
Basophils Absolute: 0 10*3/uL (ref 0.0–0.1)
Basophils Relative: 0.2 % (ref 0.0–3.0)
EOS PCT: 2.7 % (ref 0.0–5.0)
Eosinophils Absolute: 0.3 10*3/uL (ref 0.0–0.7)
HCT: 35.6 % — ABNORMAL LOW (ref 36.0–46.0)
Hemoglobin: 10.9 g/dL — ABNORMAL LOW (ref 12.0–15.0)
LYMPHS ABS: 3.8 10*3/uL (ref 0.7–4.0)
LYMPHS PCT: 29.6 % (ref 12.0–46.0)
MCHC: 30.6 g/dL (ref 30.0–36.0)
MCV: 90.6 fl (ref 78.0–100.0)
MONOS PCT: 6.8 % (ref 3.0–12.0)
Monocytes Absolute: 0.9 10*3/uL (ref 0.1–1.0)
NEUTROS ABS: 7.8 10*3/uL — AB (ref 1.4–7.7)
Neutrophils Relative %: 60.7 % (ref 43.0–77.0)
PLATELETS: 507 10*3/uL — AB (ref 150.0–400.0)
RBC: 3.93 Mil/uL (ref 3.87–5.11)
RDW: 18.9 % — ABNORMAL HIGH (ref 11.5–15.5)
WBC: 12.8 10*3/uL — ABNORMAL HIGH (ref 4.0–10.5)

## 2015-02-05 LAB — LIPID PANEL
CHOLESTEROL: 219 mg/dL — AB (ref 0–200)
HDL: 73.7 mg/dL (ref >39.00)
LDL Cholesterol: 132 mg/dL — ABNORMAL HIGH (ref 0–99)
NonHDL: 145.4
Total CHOL/HDL Ratio: 3
Triglycerides: 69 mg/dL (ref 0.0–149.0)
VLDL: 13.8 mg/dL (ref 0.0–40.0)

## 2015-02-05 LAB — HEPATIC FUNCTION PANEL
ALK PHOS: 83 U/L (ref 39–117)
ALT: 10 U/L (ref 0–35)
AST: 15 U/L (ref 0–37)
Albumin: 3.9 g/dL (ref 3.5–5.2)
BILIRUBIN DIRECT: 0.1 mg/dL (ref 0.0–0.3)
TOTAL PROTEIN: 7.9 g/dL (ref 6.0–8.3)
Total Bilirubin: 0.4 mg/dL (ref 0.2–1.2)

## 2015-02-05 LAB — TSH: TSH: 0.8 u[IU]/mL (ref 0.35–4.50)

## 2015-02-05 LAB — HEMOGLOBIN A1C: Hgb A1c MFr Bld: 6.1 % (ref 4.6–6.5)

## 2015-02-05 MED ORDER — METHOCARBAMOL 750 MG PO TABS: 750.0000 mg | ORAL_TABLET | Freq: Every day | ORAL | Status: DC | PRN

## 2015-02-05 MED ORDER — ALBUTEROL SULFATE 108 (90 BASE) MCG/ACT IN AEPB: 1.0000 | INHALATION_SPRAY | Freq: Four times a day (QID) | RESPIRATORY_TRACT | Status: DC | PRN

## 2015-02-05 MED ORDER — HYDROCODONE-ACETAMINOPHEN 10-325 MG PO TABS: 1.0000 | ORAL_TABLET | Freq: Four times a day (QID) | ORAL | Status: DC | PRN

## 2015-02-05 MED ORDER — MONTELUKAST SODIUM 10 MG PO TABS
10.0000 mg | ORAL_TABLET | Freq: Every day | ORAL | Status: DC
Start: 2015-02-05 — End: 2015-05-27

## 2015-02-05 NOTE — Assessment & Plan Note (Addendum)
On Advair 12/16 added Singulair, [place]

## 2015-02-05 NOTE — Assessment & Plan Note (Signed)
Triamc oint 

## 2015-02-05 NOTE — Assessment & Plan Note (Signed)
Discussed.

## 2015-02-05 NOTE — Assessment & Plan Note (Signed)
On Verapamil 

## 2015-02-05 NOTE — Progress Notes (Signed)
Pre visit review using our clinic review tool, if applicable. No additional management support is needed unless otherwise documented below in the visit note. 

## 2015-02-05 NOTE — Progress Notes (Signed)
Subjective:  Patient ID: Mallory Klein, female    DOB: [birth year]  Age: 69 y.o. MRN: [id number]  CC: No chief complaint on file.   HPI Mallory Klein presents for chronic LBP, asthma, HTN f/u. C/o coughing spells x long time  Outpatient Prescriptions Prior to Visit  Medication Sig Dispense Refill  . ALPRAZolam (XANAX) 0.5 MG tablet Take 1 tablet (0.5 mg total) by mouth 2 (two) times daily as needed. 180 tablet 0  . cetirizine (ZYRTEC) 10 MG tablet Take 10 mg by mouth daily.      . Cholecalciferol (VITAMIN D3) 1000 UNITS tablet Take 2 tablets by mouth once daily     . dexlansoprazole (DEXILANT) 60 MG capsule Take 1 capsule (60 mg total) by mouth daily. 90 capsule 2  . diclofenac sodium (VOLTAREN) 1 % GEL Apply 4 g topically 4 (four) times daily. As directed. 200 g 3  . Fluticasone-Salmeterol (ADVAIR DISKUS) 250-50 MCG/DOSE AEPB Inhale 1 puff into the lungs every 12 (twelve) hours. 60 each 11  . furosemide (LASIX) 20 MG tablet Take 1-2 tablets (20-40 mg total) by mouth daily as needed for edema. 60 tablet 3  . HYDROcodone-acetaminophen (NORCO) 10-325 MG per tablet Take 1 tablet by mouth every 6 (six) hours as needed. 120 tablet 0  . methocarbamol (ROBAXIN) 750 MG tablet Take 1 tablet (750 mg total) by mouth daily as needed for muscle spasms. 30 tablet 3  . Multiple Vitamin (MULTIVITAMIN) tablet Take 1 tablet by mouth daily.    . Omega-3 Fatty Acids (FISH OIL) 1000 MG CAPS Take 1 capsule by mouth daily.    [name] oxybutynin (DITROPAN) 5 MG tablet Take 1 tablet (5 mg total) by mouth every 8 (eight) hours as needed for bladder spasms. 90 tablet 3  . PARoxetine (PAXIL) 10 MG tablet Take 2 tablets (20 mg total) by mouth daily. 180 tablet 3  . promethazine-codeine (PHENERGAN WITH CODEINE) 6.25-10 MG/5ML syrup Take 5 mLs by mouth every 6 (six) hours as needed for cough. 300 mL 0  . verapamil (VERELAN PM) 180 MG 24 hr capsule Take 1 capsule (180 mg total) by mouth at bedtime. 90 capsule 3    Facility-Administered Medications Prior to Visit  Medication Dose Route Frequency Provider Last Rate Last Dose  . methylPREDNISolone acetate (DEPO-MEDROL) injection 40 mg  40 mg Intra-articular Once Larwence Tu V, MD        ROS Review of Systems  Constitutional: Negative for chills, activity change, appetite change, fatigue and unexpected weight change.  HENT: Negative for congestion, mouth sores and sinus pressure.   Eyes: Negative for visual disturbance.  Respiratory: Negative for cough and chest tightness.   Gastrointestinal: Negative for nausea and abdominal pain.  Genitourinary: Negative for frequency, difficulty urinating and vaginal pain.  Musculoskeletal: Negative for back pain and gait problem.  Skin: Negative for pallor and rash.  Neurological: Negative for dizziness, tremors, weakness, numbness and headaches.  Psychiatric/Behavioral: Negative for confusion and sleep disturbance.    Objective:  There were no vitals taken for this visit.  BP Readings from Last 3 Encounters:  10/15/14 130/84  06/15/14 120/88  02/21/14 136/71    Wt Readings from Last 3 Encounters:  10/15/14 203 lb (92.08 kg)  06/15/14 198 lb (89.812 kg)  02/20/14 203 lb (92.08 kg)    Physical Exam  Constitutional: She appears well-developed. No distress.  HENT:  Head: Normocephalic.  Right Ear: External ear normal.  Left Ear: External ear normal.  Nose: Nose normal.  Mouth/Throat: Oropharynx is clear and moist.  Eyes: Conjunctivae are normal. Pupils are equal, round, and reactive to light. Right eye exhibits no discharge. Left eye exhibits no discharge.  Neck: Normal range of motion. Neck supple. No JVD present. No tracheal deviation present. No thyromegaly present.  Cardiovascular: Normal rate, regular rhythm and normal heart sounds.   Pulmonary/Chest: No stridor. No respiratory distress. She has no wheezes.  Abdominal: Soft. Bowel sounds are normal. She exhibits no distension and no  mass. There is no tenderness. There is no rebound and no guarding.  Musculoskeletal: She exhibits no edema or tenderness.  Lymphadenopathy:    She has no cervical adenopathy.  Neurological: She displays normal reflexes. No cranial nerve deficit. She exhibits normal muscle tone. Coordination normal.  Skin: No rash noted. No erythema.  Psychiatric: She has a normal mood and affect. Her behavior is normal. Judgment and thought content normal.  R knee stiff/painful Cane  Lab Results  Component Value Date   WBC 10.9* 02/07/2014   HGB 12.3 02/07/2014   HCT 38.4 02/07/2014   PLT 556* 02/07/2014   GLUCOSE 93 06/15/2014   CHOL 241* 08/21/2013   TRIG 60.0 08/21/2013   HDL 82.10 08/21/2013   LDLDIRECT 109.8 02/10/2010   LDLCALC 147* 08/21/2013   ALT 14 08/21/2013   AST 19 08/21/2013   NA 141 06/15/2014   K 3.9 06/15/2014   CL 105 06/15/2014   CREATININE 1.04 06/15/2014   BUN 18 06/15/2014   CO2 31 06/15/2014   TSH 0.70 08/21/2013   INR 0.97 02/07/2014   HGBA1C 6.1 08/21/2013    Chest 2 View  02/07/2014  CLINICAL DATA:  Preoperative exam.  Back surgery. EXAM: CHEST  2 VIEW COMPARISON:  01/19/2010 FINDINGS: The heart size and mediastinal contours are within normal limits. Both lungs are clear. The visualized skeletal structures are unremarkable. IMPRESSION: No active cardiopulmonary disease. Electronically Signed   By: Rolm Baptise M.D.   On: 02/07/2014 15:55   I personally provided Proair Respiclick inhaler use teaching. After the teaching patient was able to demonstrate it's use effectively. All questions were answered  Assessment & Plan:   There are no diagnoses linked to this encounter. I am having Ms. Chimento maintain her cholecalciferol, cetirizine, Fluticasone-Salmeterol, multivitamin, dexlansoprazole, diclofenac sodium, Fish Oil, PARoxetine, ALPRAZolam, verapamil, methocarbamol, HYDROcodone-acetaminophen, furosemide, oxybutynin, and promethazine-codeine. We will continue to  administer methylPREDNISolone acetate.  No orders of the defined types were placed in this encounter.     Follow-up: No Follow-up on file.  Walker Kehr, MD

## 2015-02-05 NOTE — Assessment & Plan Note (Signed)
Repeat CBC in 1 month Hem consult if needed

## 2015-02-05 NOTE — Assessment & Plan Note (Signed)
She may check out [employer]

## 2015-04-18 ENCOUNTER — Other Ambulatory Visit: Payer: Self-pay | Admitting: Internal Medicine

## 2015-04-19 ENCOUNTER — Other Ambulatory Visit: Payer: Self-pay | Admitting: General Practice

## 2015-04-19 MED ORDER — VERAPAMIL HCL ER 180 MG PO CP24: 180.0000 mg | ORAL_CAPSULE | Freq: Every day | ORAL | Status: DC

## 2015-04-26 ENCOUNTER — Telehealth: Payer: Self-pay

## 2015-04-26 MED ORDER — DEXLANSOPRAZOLE 60 MG PO CPDR: 60.0000 mg | DELAYED_RELEASE_CAPSULE | Freq: Every day | ORAL | Status: DC

## 2015-04-26 NOTE — Telephone Encounter (Signed)
Finished filling out form for Dexilant assistance - waiting for Dr. Blanch Media original signature on the rx  - then I will mail it.

## 2015-04-30 NOTE — Telephone Encounter (Signed)
Mailed assistance forms

## 2015-05-02 ENCOUNTER — Telehealth: Payer: Self-pay

## 2015-05-02 NOTE — Telephone Encounter (Signed)
Application mailed.

## 2015-05-02 NOTE — Telephone Encounter (Signed)
Mailed completed application and original signature rx for Dexilant to [facility]

## 2015-05-07 ENCOUNTER — Ambulatory Visit: Payer: Medicare Other | Admitting: Internal Medicine

## 2015-05-27 ENCOUNTER — Ambulatory Visit (INDEPENDENT_AMBULATORY_CARE_PROVIDER_SITE_OTHER): Payer: Medicare Other | Admitting: Internal Medicine

## 2015-05-27 ENCOUNTER — Encounter: Payer: Self-pay | Admitting: Internal Medicine

## 2015-05-27 VITALS — BP 140/80 | HR 103 | Wt 203.0 lb

## 2015-05-27 DIAGNOSIS — J4520 Mild intermittent asthma, uncomplicated: Secondary | ICD-10-CM

## 2015-05-27 DIAGNOSIS — M5440 Lumbago with sciatica, unspecified side: Secondary | ICD-10-CM | POA: Diagnosis not present

## 2015-05-27 DIAGNOSIS — I10 Essential (primary) hypertension: Secondary | ICD-10-CM

## 2015-05-27 DIAGNOSIS — F411 Generalized anxiety disorder: Secondary | ICD-10-CM | POA: Diagnosis not present

## 2015-05-27 MED ORDER — MONTELUKAST SODIUM 10 MG PO TABS: 10.0000 mg | ORAL_TABLET | Freq: Every day | ORAL | Status: DC

## 2015-05-27 MED ORDER — FLUTICASONE-SALMETEROL 250-50 MCG/DOSE IN AEPB: 1.0000 | INHALATION_SPRAY | Freq: Two times a day (BID) | RESPIRATORY_TRACT | Status: DC

## 2015-05-27 MED ORDER — PROMETHAZINE-CODEINE 6.25-10 MG/5ML PO SYRP: 5.0000 mL | ORAL_SOLUTION | Freq: Four times a day (QID) | ORAL | Status: DC | PRN

## 2015-05-27 MED ORDER — METHYLPREDNISOLONE ACETATE 80 MG/ML IJ SUSP
80.0000 mg | Freq: Once | INTRAMUSCULAR | Status: AC
  Administered 2015-05-27: 80 mg via INTRAMUSCULAR

## 2015-05-27 NOTE — Assessment & Plan Note (Signed)
Norco prn 

## 2015-05-27 NOTE — Assessment & Plan Note (Signed)
Exacerbation Proair Advair Depo-medrol 80 mg IM

## 2015-05-27 NOTE — Assessment & Plan Note (Signed)
Chronic ALPRAZOLAM prn  Potential benefits of a long term benzodiazepines  use as well as potential risks  and complications were explained to the patient and were aknowledged.

## 2015-05-27 NOTE — Progress Notes (Signed)
Subjective:  Patient ID: Mallory Klein, female    DOB: [birth year]  Age: 70 y.o. MRN: [id number]  CC: No chief complaint on file.   HPI Mallory Klein presents for OA, asthma - worse, HTN f/u. Pt just got over a flu.  Outpatient Prescriptions Prior to Visit  Medication Sig Dispense Refill  . Albuterol Sulfate (PROAIR RESPICLICK) [id number] (90 BASE) MCG/ACT AEPB Inhale 1-2 puffs into the lungs 4 (four) times daily as needed. 1 each 11  . ALPRAZolam (XANAX) 0.5 MG tablet Take 1 tablet (0.5 mg total) by mouth 2 (two) times daily as needed. 180 tablet 0  . cetirizine (ZYRTEC) 10 MG tablet Take 10 mg by mouth daily.      . Cholecalciferol (VITAMIN D3) 1000 UNITS tablet Take 2 tablets by mouth once daily     . dexlansoprazole (DEXILANT) 60 MG capsule Take 1 capsule (60 mg total) by mouth daily. 90 capsule 3  . diclofenac sodium (VOLTAREN) 1 % GEL Apply 4 g topically 4 (four) times daily. As directed. 200 g 3  . Fluticasone-Salmeterol (ADVAIR DISKUS) 250-50 MCG/DOSE AEPB Inhale 1 puff into the lungs every 12 (twelve) hours. 60 each 11  . furosemide (LASIX) 20 MG tablet Take 1-2 tablets (20-40 mg total) by mouth daily as needed for edema. 60 tablet 3  . HYDROcodone-acetaminophen (NORCO) 10-325 MG tablet Take 1 tablet by mouth every 6 (six) hours as needed. 120 tablet 0  . methocarbamol (ROBAXIN) 750 MG tablet Take 1 tablet (750 mg total) by mouth daily as needed for muscle spasms. 30 tablet 3  . montelukast (SINGULAIR) 10 MG tablet Take 1 tablet (10 mg total) by mouth daily. 90 tablet 3  . Multiple Vitamin (MULTIVITAMIN) tablet Take 1 tablet by mouth daily.    . Omega-3 Fatty Acids (FISH OIL) 1000 MG CAPS Take 1 capsule by mouth daily.    [name] oxybutynin (DITROPAN) 5 MG tablet Take 1 tablet (5 mg total) by mouth every 8 (eight) hours as needed for bladder spasms. 90 tablet 3  . PARoxetine (PAXIL) 10 MG tablet TAKE 2 TABLETS BY MOUTH EVERY DAY 60 tablet 3  . promethazine-codeine (PHENERGAN WITH  CODEINE) 6.25-10 MG/5ML syrup Take 5 mLs by mouth every 6 (six) hours as needed for cough. 300 mL 0  . verapamil (VERELAN PM) 180 MG 24 hr capsule Take 1 capsule (180 mg total) by mouth at bedtime. 90 capsule 1   Facility-Administered Medications Prior to Visit  Medication Dose Route Frequency Provider Last Rate Last Dose  . methylPREDNISolone acetate (DEPO-MEDROL) injection 40 mg  40 mg Intra-articular Once Jelesa Mangini V, MD        ROS Review of Systems  Constitutional: Negative for chills, activity change, appetite change, fatigue and unexpected weight change.  HENT: Negative for congestion, mouth sores and sinus pressure.   Eyes: Negative for visual disturbance.  Respiratory: Positive for chest tightness, shortness of breath and wheezing. Negative for cough.   Gastrointestinal: Negative for nausea and abdominal pain.  Genitourinary: Negative for frequency, difficulty urinating and vaginal pain.  Musculoskeletal: Negative for back pain and gait problem.  Skin: Negative for pallor and rash.  Neurological: Negative for dizziness, tremors, weakness, numbness and headaches.  Psychiatric/Behavioral: Negative for suicidal ideas, confusion and sleep disturbance.    Objective:  BP 140/80 mmHg  Pulse 103  Wt 203 lb (92.08 kg)  SpO2 92%  BP Readings from Last 3 Encounters:  05/27/15 140/80  02/05/15 100/60  10/15/14 130/84  Wt Readings from Last 3 Encounters:  05/27/15 203 lb (92.08 kg)  02/05/15 204 lb (92.534 kg)  10/15/14 203 lb (92.08 kg)    Physical Exam  Constitutional: She appears well-developed. No distress.  HENT:  Head: Normocephalic.  Right Ear: External ear normal.  Left Ear: External ear normal.  Nose: Nose normal.  Mouth/Throat: Oropharynx is clear and moist.  Eyes: Conjunctivae are normal. Pupils are equal, round, and reactive to light. Right eye exhibits no discharge. Left eye exhibits no discharge.  Neck: Normal range of motion. Neck supple. No JVD  present. No tracheal deviation present. No thyromegaly present.  Cardiovascular: Normal rate, regular rhythm and normal heart sounds.   Pulmonary/Chest: No stridor. No respiratory distress. She has wheezes.  Abdominal: Soft. Bowel sounds are normal. She exhibits no distension and no mass. There is no tenderness. There is no rebound and no guarding.  Musculoskeletal: She exhibits no edema or tenderness.  Lymphadenopathy:    She has no cervical adenopathy.  Neurological: She displays normal reflexes. No cranial nerve deficit. She exhibits normal muscle tone. Coordination normal.  Skin: No rash noted. No erythema.  Psychiatric: She has a normal mood and affect. Her behavior is normal. Judgment and thought content normal.    Lab Results  Component Value Date   WBC 12.8* 02/05/2015   HGB 10.9* 02/05/2015   HCT 35.6* 02/05/2015   PLT 507.0* 02/05/2015   GLUCOSE 106* 02/05/2015   CHOL 219* 02/05/2015   TRIG 69.0 02/05/2015   HDL 73.70 02/05/2015   LDLDIRECT 109.8 02/10/2010   LDLCALC 132* 02/05/2015   ALT 10 02/05/2015   AST 15 02/05/2015   NA 141 02/05/2015   K 4.4 02/05/2015   CL 103 02/05/2015   CREATININE 1.11 02/05/2015   BUN 14 02/05/2015   CO2 31 02/05/2015   TSH 0.80 02/05/2015   INR 0.97 02/07/2014   HGBA1C 6.1 02/05/2015    Chest 2 View  02/07/2014  CLINICAL DATA:  Preoperative exam.  Back surgery. EXAM: CHEST  2 VIEW COMPARISON:  01/19/2010 FINDINGS: The heart size and mediastinal contours are within normal limits. Both lungs are clear. The visualized skeletal structures are unremarkable. IMPRESSION: No active cardiopulmonary disease. Electronically Signed   By: Rolm Baptise M.D.   On: 02/07/2014 15:55    Assessment & Plan:   There are no diagnoses linked to this encounter. I am having Ms. Sharpnack maintain her cholecalciferol, cetirizine, Fluticasone-Salmeterol, multivitamin, diclofenac sodium, Fish Oil, ALPRAZolam, furosemide, oxybutynin, promethazine-codeine,  HYDROcodone-acetaminophen, methocarbamol, montelukast, Albuterol Sulfate, PARoxetine, verapamil, and dexlansoprazole. We will continue to administer methylPREDNISolone acetate.  No orders of the defined types were placed in this encounter.     Follow-up: No Follow-up on file.  Walker Kehr, MD

## 2015-05-27 NOTE — Assessment & Plan Note (Signed)
Chronic Verapamil 

## 2015-05-27 NOTE — Progress Notes (Signed)
Pre visit review using our clinic review tool, if applicable. No additional management support is needed unless otherwise documented below in the visit note. 

## 2015-09-03 ENCOUNTER — Ambulatory Visit (INDEPENDENT_AMBULATORY_CARE_PROVIDER_SITE_OTHER): Payer: Medicare Other | Admitting: Internal Medicine

## 2015-09-03 ENCOUNTER — Other Ambulatory Visit (INDEPENDENT_AMBULATORY_CARE_PROVIDER_SITE_OTHER): Payer: Medicare Other

## 2015-09-03 ENCOUNTER — Encounter: Payer: Self-pay | Admitting: Internal Medicine

## 2015-09-03 VITALS — BP 140/60 | HR 102 | Wt 206.0 lb

## 2015-09-03 DIAGNOSIS — M5440 Lumbago with sciatica, unspecified side: Secondary | ICD-10-CM

## 2015-09-03 DIAGNOSIS — R413 Other amnesia: Secondary | ICD-10-CM | POA: Diagnosis not present

## 2015-09-03 DIAGNOSIS — J4520 Mild intermittent asthma, uncomplicated: Secondary | ICD-10-CM

## 2015-09-03 DIAGNOSIS — R202 Paresthesia of skin: Secondary | ICD-10-CM | POA: Diagnosis not present

## 2015-09-03 DIAGNOSIS — I10 Essential (primary) hypertension: Secondary | ICD-10-CM

## 2015-09-03 DIAGNOSIS — G250 Essential tremor: Secondary | ICD-10-CM

## 2015-09-03 LAB — TSH: TSH: 0.84 u[IU]/mL (ref 0.35–4.50)

## 2015-09-03 LAB — CBC WITH DIFFERENTIAL/PLATELET
Basophils Absolute: 0 10*3/uL (ref 0.0–0.1)
Basophils Relative: 0.2 % (ref 0.0–3.0)
EOS PCT: 3.5 % (ref 0.0–5.0)
Eosinophils Absolute: 0.5 10*3/uL (ref 0.0–0.7)
HEMATOCRIT: 33.9 % — AB (ref 36.0–46.0)
HEMOGLOBIN: 10.5 g/dL — AB (ref 12.0–15.0)
LYMPHS ABS: 3.6 10*3/uL (ref 0.7–4.0)
LYMPHS PCT: 27.3 % (ref 12.0–46.0)
MCHC: 30.9 g/dL (ref 30.0–36.0)
MCV: 91.7 fl (ref 78.0–100.0)
MONOS PCT: 8.9 % (ref 3.0–12.0)
Monocytes Absolute: 1.2 10*3/uL — ABNORMAL HIGH (ref 0.1–1.0)
NEUTROS PCT: 60.1 % (ref 43.0–77.0)
Neutro Abs: 8 10*3/uL — ABNORMAL HIGH (ref 1.4–7.7)
Platelets: 586 10*3/uL — ABNORMAL HIGH (ref 150.0–400.0)
RBC: 3.7 Mil/uL — AB (ref 3.87–5.11)
RDW: 18.9 % — ABNORMAL HIGH (ref 11.5–15.5)
WBC: 13.3 10*3/uL — ABNORMAL HIGH (ref 4.0–10.5)

## 2015-09-03 LAB — BASIC METABOLIC PANEL
BUN: 12 mg/dL (ref 6–23)
CO2: 32 mEq/L (ref 19–32)
Calcium: 9.3 mg/dL (ref 8.4–10.5)
Chloride: 102 mEq/L (ref 96–112)
Creatinine, Ser: 0.96 mg/dL (ref 0.40–1.20)
GFR: 73.95 mL/min (ref >60.00)
GLUCOSE: 92 mg/dL (ref 70–99)
POTASSIUM: 4.2 meq/L (ref 3.5–5.1)
SODIUM: 140 meq/L (ref 135–145)

## 2015-09-03 LAB — VITAMIN B12: Vitamin B-12: 349 pg/mL (ref 211–911)

## 2015-09-03 LAB — SEDIMENTATION RATE: Sed Rate: 111 mm/hr — ABNORMAL HIGH (ref 0–30)

## 2015-09-03 LAB — HEPATIC FUNCTION PANEL
ALK PHOS: 77 U/L (ref 39–117)
ALT: 8 U/L (ref 0–35)
AST: 14 U/L (ref 0–37)
Albumin: 3.9 g/dL (ref 3.5–5.2)
BILIRUBIN DIRECT: 0.1 mg/dL (ref 0.0–0.3)
TOTAL PROTEIN: 8.4 g/dL — AB (ref 6.0–8.3)
Total Bilirubin: 0.3 mg/dL (ref 0.2–1.2)

## 2015-09-03 MED ORDER — OXYBUTYNIN CHLORIDE 5 MG PO TABS: 5.0000 mg | ORAL_TABLET | Freq: Three times a day (TID) | ORAL | Status: DC | PRN

## 2015-09-03 MED ORDER — VENLAFAXINE HCL ER 75 MG PO CP24: 75.0000 mg | ORAL_CAPSULE | Freq: Every day | ORAL | Status: DC

## 2015-09-03 MED ORDER — PAROXETINE HCL 10 MG PO TABS: 20.0000 mg | ORAL_TABLET | Freq: Every day | ORAL | Status: DC

## 2015-09-03 MED ORDER — VERAPAMIL HCL ER 180 MG PO CP24: 180.0000 mg | ORAL_CAPSULE | Freq: Every day | ORAL | Status: DC

## 2015-09-03 NOTE — Assessment & Plan Note (Signed)
Worse Neurol ref 

## 2015-09-03 NOTE — Assessment & Plan Note (Addendum)
7/17 Depression related vs MCD vs dementia Cut back on Norco, Xanax, Oxybutinine D/c Paxil. Start Effexor XR Neurol ref Labs

## 2015-09-03 NOTE — Assessment & Plan Note (Signed)
Verapamil 

## 2015-09-03 NOTE — Assessment & Plan Note (Signed)
On Advair 12/16 added Singulair

## 2015-09-03 NOTE — Progress Notes (Signed)
Pre visit review using our clinic review tool, if applicable. No additional management support is needed unless otherwise documented below in the visit note. 

## 2015-09-03 NOTE — Assessment & Plan Note (Signed)
Norco  Potential benefits of a long term opioids use as well as potential risks (i.e. addiction risk, apnea etc) and complications (i.e. Somnolence, constipation and others) were explained to the patient and were aknowledged. 

## 2015-09-03 NOTE — Progress Notes (Signed)
Subjective:  Patient ID: Mallory Klein, female    DOB: [birth year]  Age: 70 y.o. MRN: [id number]  CC: No chief complaint on file.   HPI Mallory Klein presents for fatigue and weakness. C/o memory difficulties. She has been stayng in bed all day watching TV and playing on ipad.  F/u DM, HTN, knee OA  Outpatient Prescriptions Prior to Visit  Medication Sig Dispense Refill  . Albuterol Sulfate (PROAIR RESPICLICK) 123XX123 (90 BASE) MCG/ACT AEPB Inhale 1-2 puffs into the lungs 4 (four) times daily as needed. 1 each 11  . ALPRAZolam (XANAX) 0.5 MG tablet Take 1 tablet (0.5 mg total) by mouth 2 (two) times daily as needed. 180 tablet 0  . cetirizine (ZYRTEC) 10 MG tablet Take 10 mg by mouth daily.      . Cholecalciferol (VITAMIN D3) 1000 UNITS tablet Take 2 tablets by mouth once daily     . dexlansoprazole (DEXILANT) 60 MG capsule Take 1 capsule (60 mg total) by mouth daily. 90 capsule 3  . diclofenac sodium (VOLTAREN) 1 % GEL Apply 4 g topically 4 (four) times daily. As directed. 200 g 3  . Fluticasone-Salmeterol (ADVAIR DISKUS) 250-50 MCG/DOSE AEPB Inhale 1 puff into the lungs every 12 (twelve) hours. 60 each 11  . furosemide (LASIX) 20 MG tablet Take 1-2 tablets (20-40 mg total) by mouth daily as needed for edema. 60 tablet 3  . HYDROcodone-acetaminophen (NORCO) 10-325 MG tablet Take 1 tablet by mouth every 6 (six) hours as needed. 120 tablet 0  . methocarbamol (ROBAXIN) 750 MG tablet Take 1 tablet (750 mg total) by mouth daily as needed for muscle spasms. 30 tablet 3  . montelukast (SINGULAIR) 10 MG tablet Take 1 tablet (10 mg total) by mouth daily. 90 tablet 3  . Multiple Vitamin (MULTIVITAMIN) tablet Take 1 tablet by mouth daily.    . Omega-3 Fatty Acids (FISH OIL) 1000 MG CAPS Take 1 capsule by mouth daily.    [name] oxybutynin (DITROPAN) 5 MG tablet Take 1 tablet (5 mg total) by mouth every 8 (eight) hours as needed for bladder spasms. 90 tablet 3  . PARoxetine (PAXIL) 10 MG tablet TAKE 2  TABLETS BY MOUTH EVERY DAY 60 tablet 3  . promethazine-codeine (PHENERGAN WITH CODEINE) 6.25-10 MG/5ML syrup Take 5 mLs by mouth every 6 (six) hours as needed for cough. 300 mL 0  . verapamil (VERELAN PM) 180 MG 24 hr capsule Take 1 capsule (180 mg total) by mouth at bedtime. 90 capsule 1   Facility-Administered Medications Prior to Visit  Medication Dose Route Frequency Provider Last Rate Last Dose  . methylPREDNISolone acetate (DEPO-MEDROL) injection 40 mg  40 mg Intra-articular Once Mallory Anger, MD        ROS Review of Systems  Constitutional: Positive for fatigue. Negative for chills, activity change, appetite change and unexpected weight change.  HENT: Negative for congestion, mouth sores and sinus pressure.   Eyes: Negative for visual disturbance.  Respiratory: Negative for cough and chest tightness.   Gastrointestinal: Negative for nausea and abdominal pain.  Genitourinary: Negative for frequency, difficulty urinating and vaginal pain.  Musculoskeletal: Negative for back pain and gait problem.  Skin: Negative for pallor and rash.  Neurological: Negative for dizziness, tremors, weakness, numbness and headaches.  Psychiatric/Behavioral: Positive for sleep disturbance and decreased concentration. Negative for confusion. The patient is not nervous/anxious.     Objective:  BP 140/60 mmHg  Pulse 102  Wt 206 lb (93.441 kg)  SpO2 95%  BP Readings from Last 3 Encounters:  09/03/15 140/60  05/27/15 140/80  02/05/15 100/60    Wt Readings from Last 3 Encounters:  09/03/15 206 lb (93.441 kg)  05/27/15 203 lb (92.08 kg)  02/05/15 204 lb (92.534 kg)    Physical Exam  Constitutional: She appears well-developed. No distress.  HENT:  Head: Normocephalic.  Right Ear: External ear normal.  Left Ear: External ear normal.  Nose: Nose normal.  Mouth/Throat: Oropharynx is clear and moist.  Eyes: Conjunctivae are normal. Pupils are equal, round, and reactive to light. Right eye  exhibits no discharge. Left eye exhibits no discharge.  Neck: Normal range of motion. Neck supple. No JVD present. No tracheal deviation present. No thyromegaly present.  Cardiovascular: Normal rate, regular rhythm and normal heart sounds.   Pulmonary/Chest: No stridor. No respiratory distress. She has no wheezes.  Abdominal: Soft. Bowel sounds are normal. She exhibits no distension and no mass. There is no tenderness. There is no rebound and no guarding.  Musculoskeletal: She exhibits tenderness. She exhibits no edema.  Lymphadenopathy:    She has no cervical adenopathy.  Neurological: She displays normal reflexes. No cranial nerve deficit. She exhibits normal muscle tone. Coordination abnormal.  Skin: No rash noted. No erythema.  Psychiatric: She has a normal mood and affect. Her behavior is normal. Judgment and thought content normal.  Obese Mallory Klein Denies being depressed Alert, cooperative Wrong date Hesitant w/serial 7s    Lab Results  Component Value Date   WBC 12.8* 02/05/2015   HGB 10.9* 02/05/2015   HCT 35.6* 02/05/2015   PLT 507.0* 02/05/2015   GLUCOSE 106* 02/05/2015   CHOL 219* 02/05/2015   TRIG 69.0 02/05/2015   HDL 73.70 02/05/2015   LDLDIRECT 109.8 02/10/2010   LDLCALC 132* 02/05/2015   ALT 10 02/05/2015   AST 15 02/05/2015   NA 141 02/05/2015   K 4.4 02/05/2015   CL 103 02/05/2015   CREATININE 1.11 02/05/2015   BUN 14 02/05/2015   CO2 31 02/05/2015   TSH 0.80 02/05/2015   INR 0.97 02/07/2014   HGBA1C 6.1 02/05/2015    Chest 2 View  02/07/2014  CLINICAL DATA:  Preoperative exam.  Back surgery. EXAM: CHEST  2 VIEW COMPARISON:  01/19/2010 FINDINGS: The heart size and mediastinal contours are within normal limits. Both lungs are clear. The visualized skeletal structures are unremarkable. IMPRESSION: No active cardiopulmonary disease. Electronically Signed   By: Mallory Klein M.D.   On: 02/07/2014 15:55    Assessment & Plan:   There are no diagnoses linked  to this encounter. I am having Mallory Klein maintain her cholecalciferol, cetirizine, multivitamin, diclofenac sodium, Fish Oil, ALPRAZolam, furosemide, oxybutynin, HYDROcodone-acetaminophen, methocarbamol, Albuterol Sulfate, PARoxetine, verapamil, dexlansoprazole, promethazine-codeine, Fluticasone-Salmeterol, and montelukast. We will continue to administer methylPREDNISolone acetate.  No orders of the defined types were placed in this encounter.     Follow-up: No Follow-up on file.  Walker Kehr, MD

## 2015-09-03 NOTE — Assessment & Plan Note (Signed)
Chronic  Belviq caused side effects

## 2015-09-04 ENCOUNTER — Telehealth: Payer: Self-pay | Admitting: Emergency Medicine

## 2015-09-04 NOTE — Telephone Encounter (Signed)
Received voicemail from pt, stating that she went to pharmacy to pick up new prescription Venlafaxine 75mg . Pharm told pt she needed to be weaned off current meds before starting Venlafaxine. Please follow-up with pt.

## 2015-09-05 ENCOUNTER — Other Ambulatory Visit: Payer: Self-pay | Admitting: Internal Medicine

## 2015-09-05 DIAGNOSIS — R700 Elevated erythrocyte sedimentation rate: Secondary | ICD-10-CM

## 2015-09-05 NOTE — Telephone Encounter (Signed)
Notified pt w/MD response.../lmb 

## 2015-09-05 NOTE — Telephone Encounter (Signed)
Take Paxil 1/2 tab x2 days, then stop Paxil. Start Effexor the next day Thx

## 2015-09-08 ENCOUNTER — Encounter: Payer: Self-pay | Admitting: Internal Medicine

## 2015-09-09 ENCOUNTER — Other Ambulatory Visit (INDEPENDENT_AMBULATORY_CARE_PROVIDER_SITE_OTHER): Payer: Medicare Other

## 2015-09-09 DIAGNOSIS — R700 Elevated erythrocyte sedimentation rate: Secondary | ICD-10-CM | POA: Diagnosis not present

## 2015-09-09 LAB — SEDIMENTATION RATE: SED RATE: 110 mm/h — AB (ref 0–30)

## 2015-09-10 LAB — ANA: ANA: NEGATIVE

## 2015-09-10 LAB — HEPATITIS C ANTIBODY: HCV Ab: NEGATIVE

## 2015-09-10 LAB — HIV ANTIBODY (ROUTINE TESTING W REFLEX): HIV: NONREACTIVE

## 2015-09-10 LAB — CYCLIC CITRUL PEPTIDE ANTIBODY, IGG: Cyclic Citrullin Peptide Ab: <16 Units

## 2015-09-10 LAB — RHEUMATOID FACTOR: Rhuematoid fact SerPl-aCnc: <10 IU/mL (ref <=14)

## 2015-09-11 ENCOUNTER — Other Ambulatory Visit: Payer: Medicare Other

## 2015-09-11 DIAGNOSIS — R700 Elevated erythrocyte sedimentation rate: Secondary | ICD-10-CM

## 2015-09-11 LAB — PROTEIN ELECTROPHORESIS, SERUM
ALPHA-1-GLOBULIN: 0.4 g/dL — AB (ref 0.2–0.3)
ALPHA-2-GLOBULIN: 0.7 g/dL (ref 0.5–0.9)
Albumin ELP: 4 g/dL (ref 3.8–4.8)
BETA 2: 0.7 g/dL — AB (ref 0.2–0.5)
Beta Globulin: 0.7 g/dL — ABNORMAL HIGH (ref 0.4–0.6)
GAMMA GLOBULIN: 1.6 g/dL (ref 0.8–1.7)
TOTAL PROTEIN, SERUM ELECTROPHOR: 8.1 g/dL (ref 6.1–8.1)

## 2015-09-12 ENCOUNTER — Encounter: Payer: Self-pay | Admitting: Internal Medicine

## 2015-09-12 ENCOUNTER — Other Ambulatory Visit: Payer: Self-pay | Admitting: Internal Medicine

## 2015-09-12 DIAGNOSIS — M2550 Pain in unspecified joint: Secondary | ICD-10-CM

## 2015-09-12 LAB — UPEP/TP, 24-HR URINE
ALBUMIN, U: 19.2 %
ALPHA 1 UR: 5.1 %
Alpha 2, Urine: 15 %
BETA UR: 37.8 %
GAMMA UR: 22.9 %
Protein, 24H Urine: 143 mg/24 hr (ref 30–150)
Protein, Ur: 23.9 mg/dL

## 2015-09-20 ENCOUNTER — Ambulatory Visit (HOSPITAL_COMMUNITY)
Admission: EM | Admit: 2015-09-20 | Discharge: 2015-09-20 | Disposition: A | Discharge status: Home / Self Care | Payer: Medicare Other | Attending: Emergency Medicine | Admitting: Emergency Medicine

## 2015-09-20 ENCOUNTER — Encounter (HOSPITAL_COMMUNITY): Payer: Self-pay | Admitting: Emergency Medicine

## 2015-09-20 DIAGNOSIS — E860 Dehydration: Secondary | ICD-10-CM

## 2015-09-20 DIAGNOSIS — R112 Nausea with vomiting, unspecified: Secondary | ICD-10-CM | POA: Diagnosis not present

## 2015-09-20 LAB — POCT I-STAT, CHEM 8
BUN: 10 mg/dL (ref 6–20)
CALCIUM ION: 1.18 mmol/L (ref 1.12–1.23)
CHLORIDE: 99 mmol/L — AB (ref 101–111)
Creatinine, Ser: 0.8 mg/dL (ref 0.44–1.00)
Glucose, Bld: 106 mg/dL — ABNORMAL HIGH (ref 65–99)
HEMATOCRIT: 40 % (ref 36.0–46.0)
Hemoglobin: 13.6 g/dL (ref 12.0–15.0)
Potassium: 4 mmol/L (ref 3.5–5.1)
SODIUM: 142 mmol/L (ref 135–145)
TCO2: 30 mmol/L (ref 0–100)

## 2015-09-20 MED ORDER — ONDANSETRON 8 MG PO TBDP: 8.0000 mg | ORAL_TABLET | Freq: Three times a day (TID) | ORAL | 0 refills | Status: DC | PRN

## 2015-09-20 MED ORDER — ONDANSETRON 4 MG PO TBDP
8.0000 mg | ORAL_TABLET | Freq: Once | ORAL | Status: AC
  Administered 2015-09-20: 8 mg via ORAL

## 2015-09-20 MED ORDER — SODIUM CHLORIDE 0.9 % IV BOLUS (SEPSIS)
1000.0000 mL | Freq: Once | INTRAVENOUS | Status: AC
  Administered 2015-09-20: 1000 mL via INTRAVENOUS

## 2015-09-20 MED ORDER — ONDANSETRON 4 MG PO TBDP
ORAL_TABLET | ORAL | Status: AC
  Filled 2015-09-20: qty 1

## 2015-09-20 NOTE — ED Provider Notes (Signed)
HPI  SUBJECTIVE:  Mallory Klein is a 70 y.o. female who presents with 3 episodes of nonbilious, nonbloody emesis starting 2 days ago. States that she "didn't feel good" after eating dinner, and vomited it up. She states that she was unable to tolerate any by mouth today. Has been trying to eat crackers and ginger ale, which she states makes her throw up. Symptoms are better after vomiting and worse with eating. She reports loose stools, but no extensive diarrhea. No melena, hematochezia. She reports crampy abdominal pain before vomiting which then resolves. She does not have any other abdominal pain. States her abdomen feels "sore" from the vomiting. No fevers, distention, change in urine output, urinary complaints, anorexia. She states that she is hungry. No raw or undercooked foods, no recent antibiotics. She was started on Effexor last week, but denies any other change her medications. She is prescribed opiates but does not take them on a regular basis. She has not discontinued any recently. No exposure to reptiles, chickens, known sick contacts. She denies lightheadedness, dizziness, syncope. She has a past medical history of GERD, hypertension. She drinks alcohol rarely, none recently. No history of diabetes, C. difficile, abdominal surgeries, gallbladder disease, pancreatitis. PMD: Dr. Alain Marion  Blood pressure noted. Patient has not taken her blood pressure medicine since yesterday due to her not feeling well. She denies headache chest pain shortness of breath visual changes, dysarthria, pain tearing through to her back, arm or leg weakness.    Past Medical History:  Diagnosis Date  . Anxiety   . Asthma   . Depression   . Diverticulosis of colon (without mention of hemorrhage)   . Esophageal stricture   . GERD (gastroesophageal reflux disease)   . Heart murmur   . Hypertension   . Internal hemorrhoids without mention of complication   . Osteoarthritis   . Shortness of breath  dyspnea    with allergies    Past Surgical History:  Procedure Laterality Date  . ABDOMINAL HYSTERECTOMY    . BREAST CYST EXCISION Left 1980's  . LUMBAR LAMINECTOMY/DECOMPRESSION MICRODISCECTOMY Bilateral 02/20/2014   Procedure: LUMBAR TWO THREE, LUMBAR THREE FOUR LUMBAR LAMINECTOMY/DECOMPRESSION MICRODISCECTOMY 2 LEVELS;  Surgeon: Eustace Moore, MD;  Location: [facility];  Service: Neurosurgery;  Laterality: Bilateral;  . SPLENECTOMY    . TIBIA FRACTURE SURGERY Right   . TIBIA FRACTURE SURGERY Left   . TOTAL KNEE ARTHROPLASTY Right 2013    Family History  Problem Relation Age of Onset  . Hypertension Mother   . Diabetes type II Mother   . Asthma Mother   . Diabetes Mother   . Colon cancer Neg Hx   . Prostate cancer Father   . Mental illness Father     dementia    Social History  Substance Use Topics  . Smoking status: Never Smoker  . Smokeless tobacco: Never Used  . Alcohol use No     Comment: wine on occ      Current Facility-Administered Medications:  .  methylPREDNISolone acetate (DEPO-MEDROL) injection 40 mg, 40 mg, Intra-articular, Once, Cassandria Anger, MD  Current Outpatient Prescriptions:  .  Albuterol Sulfate (PROAIR RESPICLICK) 123XX123 (90 BASE) MCG/ACT AEPB, Inhale 1-2 puffs into the lungs 4 (four) times daily as needed., Disp: 1 each, Rfl: 11 .  ALPRAZolam (XANAX) 0.5 MG tablet, Take 1 tablet (0.5 mg total) by mouth 2 (two) times daily as needed., Disp: 180 tablet, Rfl: 0 .  cetirizine (ZYRTEC) 10 MG tablet, Take 10 mg  by mouth daily.  , Disp: , Rfl:  .  Cholecalciferol (VITAMIN D3) 1000 UNITS tablet, Take 2 tablets by mouth once daily , Disp: , Rfl:  .  dexlansoprazole (DEXILANT) 60 MG capsule, Take 1 capsule (60 mg total) by mouth daily., Disp: 90 capsule, Rfl: 3 .  diclofenac sodium (VOLTAREN) 1 % GEL, Apply 4 g topically 4 (four) times daily. As directed., Disp: 200 g, Rfl: 3 .  Fluticasone-Salmeterol (ADVAIR DISKUS) 250-50 MCG/DOSE AEPB, Inhale 1  puff into the lungs every 12 (twelve) hours., Disp: 60 each, Rfl: 11 .  furosemide (LASIX) 20 MG tablet, Take 1-2 tablets (20-40 mg total) by mouth daily as needed for edema., Disp: 60 tablet, Rfl: 3 .  HYDROcodone-acetaminophen (NORCO) 10-325 MG tablet, Take 1 tablet by mouth every 6 (six) hours as needed., Disp: 120 tablet, Rfl: 0 .  methocarbamol (ROBAXIN) 750 MG tablet, Take 1 tablet (750 mg total) by mouth daily as needed for muscle spasms., Disp: 30 tablet, Rfl: 3 .  montelukast (SINGULAIR) 10 MG tablet, Take 1 tablet (10 mg total) by mouth daily., Disp: 90 tablet, Rfl: 3 .  Multiple Vitamin (MULTIVITAMIN) tablet, Take 1 tablet by mouth daily., Disp: , Rfl:  .  Omega-3 Fatty Acids (FISH OIL) 1000 MG CAPS, Take 1 capsule by mouth daily., Disp: , Rfl:  .  ondansetron (ZOFRAN ODT) 8 MG disintegrating tablet, Take 1 tablet (8 mg total) by mouth every 8 (eight) hours as needed for nausea or vomiting., Disp: 20 tablet, Rfl: 0 .  oxybutynin (DITROPAN) 5 MG tablet, Take 1 tablet (5 mg total) by mouth every 8 (eight) hours as needed for bladder spasms., Disp: 90 tablet, Rfl: 3 .  venlafaxine XR (EFFEXOR XR) 75 MG 24 hr capsule, Take 1 capsule (75 mg total) by mouth daily with breakfast., Disp: 30 capsule, Rfl: 5 .  verapamil (VERELAN PM) 180 MG 24 hr capsule, Take 1 capsule (180 mg total) by mouth at bedtime., Disp: 90 capsule, Rfl: 3  Allergies  Allergen Reactions  . Ace Inhibitors     Patient doesn't recall reaction.  jkl  . Aspirin Other (See Comments)    bruising  . Citalopram Hydrobromide Diarrhea     ROS  As noted in HPI.   Physical Exam  BP 160/80 (BP Location: Left Arm)   Pulse 85   Temp 99.2 F (37.3 C) (Oral)   Resp 16   SpO2 97%   Orthostatic VS for the past 24 hrs:  BP- Lying Pulse- Lying BP- Sitting Pulse- Sitting BP- Standing at 0 minutes Pulse- Standing at 0 minutes  09/20/15 1832 189/85 80 191/85 85 (!) 182/111 98  09/20/15 1651 (!) 162/106 78 (!) 175/99 94 (!)  173/114 105   Orthostatic VS for the past 24 hrs:  BP- Lying Pulse- Lying BP- Sitting Pulse- Sitting BP- Standing at 0 minutes Pulse- Standing at 0 minutes  09/20/15 1832 189/85 80 191/85 85 (!) 182/111 98  09/20/15 1651 (!) 162/106 78 (!) 175/99 94 (!) 173/114 105     Constitutional: Well developed, well nourished, no acute distress Eyes: PERRL, EOMI, conjunctiva normal bilaterally HENT: Normocephalic, atraumatic,mucus membranes moist Respiratory: Normal inspiratory effort Cardiovascular: Normal rate and rhythm, no murmurs, no gallops, no rubs GI: Soft, nondistended, normal bowel sounds, mild diffuse tenderness, negative McBurney, negative Murphy, no rebound, no guarding Back: no CVAT skin: No rash, skin intact cap refill 2-3 seconds. Musculoskeletal: No deformities Neurologic: Alert & oriented x 3, CN II-XII grossly intact, no motor deficits,  sensation grossly intact Psychiatric: Speech and behavior appropriate   ED Course   Medications  ondansetron (ZOFRAN-ODT) disintegrating tablet 8 mg (8 mg Oral Given 09/20/15 1643)  sodium chloride 0.9 % bolus 1,000 mL (1,000 mLs Intravenous Given 09/20/15 1740)    Orders Placed This Encounter  Procedures  . Orthostatic vital signs    Standing Status:   Standing    Number of Occurrences:   1  . Offer Fluids    Standing Status:   Standing    Number of Occurrences:   69  . I-STAT, chem 8    Standing Status:   Standing    Number of Occurrences:   1   Results for orders placed or performed during the hospital encounter of 09/20/15 (from the past 24 hour(s))  I-STAT, chem 8     Status: Abnormal   Collection Time: 09/20/15  5:03 PM  Result Value Ref Range   Sodium 142 135 - 145 mmol/L   Potassium 4.0 3.5 - 5.1 mmol/L   Chloride 99 (L) 101 - 111 mmol/L   BUN 10 6 - 20 mg/dL   Creatinine, Ser 0.80 0.44 - 1.00 mg/dL   Glucose, Bld 106 (H) 65 - 99 mg/dL   Calcium, Ion 1.18 1.12 - 1.23 mmol/L   TCO2 30 0 - 100 mmol/L   Hemoglobin 13.6  12.0 - 15.0 g/dL   HCT 40.0 36.0 - 46.0 %   No results found.  ED Clinical Impression  Non-intractable vomiting with nausea, vomiting of unspecified type  Dehydration   ED Assessment/Plan  Abdomen benign. No evidence of surgical abdomen at this time. No evidence of pancreatitis, gallbladder disease, appendicitis, diverticulitis, obstruction, perforation.   Patient is orthostatic, but tolerating by mouth. She drank an entire can of ginger ale and was able to keep this down. We'll give liter of IV fluids because of her age and because she is significantly orthostatic. Her BUN and creatinine are within normal limits.   She is not orthostatic on repeat vital signs. Blood pressure noted. She is asymptomatic. She has not taken her medicine today or last night. Advised her to restart this as soon as she gets home. Discussed The signs of hypertensive emergency.  Repeat abdominal exam benign. On reevaluation post fluids, patient that she feels significantly better. Home with Zofran, push fluids. She is to follow-up with her primary care physician as needed. Gave patient very strict abdominal pain ER return precautions.  Discussed labs, MDM, plan and followup with patient. Discussed sn/sx that should prompt return to the ED. Patient agrees with plan.   *This clinic note was created using Dragon dictation software. Therefore, there may be occasional mistakes despite careful proofreading.  ?   Melynda Ripple, MD 09/20/15 [phone]

## 2015-09-20 NOTE — ED Triage Notes (Signed)
C/o nausea, vomiting, and diarrhea  States she does have GERD States abd area is painful

## 2015-09-20 NOTE — Discharge Instructions (Signed)
1/2 - 1 tab of zofran up to three times a day. This may cause constipation. Drink Pedialyte, may flavor this with Gatorade. Bland foods, broths. Progress to normal diet as tolerated. Go to the ER for fevers, above 100.4 if your abdominal pain changes or gets worse, if he stops urinating, if you feel dizzy or lightheaded, blood in your stool, black or tarry stool, chest pain, shortness of breath or other concerns.

## 2015-09-21 ENCOUNTER — Encounter (HOSPITAL_COMMUNITY): Payer: Self-pay | Admitting: Emergency Medicine

## 2015-09-21 ENCOUNTER — Emergency Department (HOSPITAL_COMMUNITY): Payer: Medicare Other

## 2015-09-21 ENCOUNTER — Emergency Department (HOSPITAL_COMMUNITY)
Admission: EM | Admit: 2015-09-21 | Discharge: 2015-09-22 | Disposition: A | Discharge status: Home / Self Care | Payer: Medicare Other | Attending: Emergency Medicine | Admitting: Emergency Medicine

## 2015-09-21 DIAGNOSIS — J45909 Unspecified asthma, uncomplicated: Secondary | ICD-10-CM | POA: Diagnosis not present

## 2015-09-21 DIAGNOSIS — E860 Dehydration: Secondary | ICD-10-CM | POA: Insufficient documentation

## 2015-09-21 DIAGNOSIS — R112 Nausea with vomiting, unspecified: Secondary | ICD-10-CM

## 2015-09-21 DIAGNOSIS — I10 Essential (primary) hypertension: Secondary | ICD-10-CM | POA: Insufficient documentation

## 2015-09-21 DIAGNOSIS — R0602 Shortness of breath: Secondary | ICD-10-CM | POA: Diagnosis not present

## 2015-09-21 DIAGNOSIS — Z79899 Other long term (current) drug therapy: Secondary | ICD-10-CM | POA: Insufficient documentation

## 2015-09-21 LAB — COMPREHENSIVE METABOLIC PANEL
ALBUMIN: 3.9 g/dL (ref 3.5–5.0)
ALT: 13 U/L — ABNORMAL LOW (ref 14–54)
AST: 20 U/L (ref 15–41)
Alkaline Phosphatase: 67 U/L (ref 38–126)
Anion gap: 8 (ref 5–15)
BUN: 9 mg/dL (ref 6–20)
CHLORIDE: 102 mmol/L (ref 101–111)
CO2: 29 mmol/L (ref 22–32)
Calcium: 9.1 mg/dL (ref 8.9–10.3)
Creatinine, Ser: 0.86 mg/dL (ref 0.44–1.00)
GFR calc Af Amer: >60 mL/min (ref >60)
GFR calc non Af Amer: >60 mL/min (ref >60)
GLUCOSE: 123 mg/dL — AB (ref 65–99)
POTASSIUM: 3.9 mmol/L (ref 3.5–5.1)
SODIUM: 139 mmol/L (ref 135–145)
Total Bilirubin: 0.6 mg/dL (ref 0.3–1.2)
Total Protein: 8 g/dL (ref 6.5–8.1)

## 2015-09-21 LAB — CBC
HEMATOCRIT: 32.6 % — AB (ref 36.0–46.0)
Hemoglobin: 10.3 g/dL — ABNORMAL LOW (ref 12.0–15.0)
MCH: 28.5 pg (ref 26.0–34.0)
MCHC: 31.6 g/dL (ref 30.0–36.0)
MCV: 90.3 fL (ref 78.0–100.0)
Platelets: 551 10*3/uL — ABNORMAL HIGH (ref 150–400)
RBC: 3.61 MIL/uL — ABNORMAL LOW (ref 3.87–5.11)
RDW: 17.4 % — AB (ref 11.5–15.5)
WBC: 11.5 10*3/uL — ABNORMAL HIGH (ref 4.0–10.5)

## 2015-09-21 LAB — LIPASE, BLOOD: LIPASE: 15 U/L (ref 11–51)

## 2015-09-21 MED ORDER — ONDANSETRON HCL 4 MG/2ML IJ SOLN
4.0000 mg | Freq: Once | INTRAMUSCULAR | Status: AC
  Administered 2015-09-21: 4 mg via INTRAVENOUS
  Filled 2015-09-21: qty 2

## 2015-09-21 MED ORDER — IPRATROPIUM-ALBUTEROL 0.5-2.5 (3) MG/3ML IN SOLN
3.0000 mL | Freq: Once | RESPIRATORY_TRACT | Status: AC
Start: 2015-09-21 — End: 2015-09-21
  Administered 2015-09-21: 3 mL via RESPIRATORY_TRACT
  Filled 2015-09-21: qty 3

## 2015-09-21 MED ORDER — SODIUM CHLORIDE 0.9 % IV BOLUS (SEPSIS)
1000.0000 mL | Freq: Once | INTRAVENOUS | Status: AC
  Administered 2015-09-21: 1000 mL via INTRAVENOUS

## 2015-09-21 NOTE — ED Notes (Signed)
Pt from home with complaints of emesis since Wed. Pt states she was seen yesterday at [facility] and was given fluids and had bloodwork done. Pt states she has had more than 3 episodes of emesis today. Pt denies diarrhea and denies urinary symptoms. Pt states she had trouble breathing, but denies it currently and has clear lung sounds

## 2015-09-21 NOTE — ED Provider Notes (Signed)
[facility] DEPT Provider Note By signing my name below, I, Mesha Guinyard, attest that this documentation has been prepared under the direction and in the presence of Treatment Team:  Attending Provider: Orpah Greek, MD.  Electronically Signed: Verlee Monte, Medical Scribe. 09/22/15. 11:08 PM.  CSN: [id number] Arrival date & time: 09/21/15  2036  First Provider Contact:  None   History   Chief Complaint Chief Complaint  Patient presents with  . Emesis   The history is provided by the patient. No language interpreter was used.   HPI Comments: Mallory Klein is a 70 y.o. female with a PMHx of asthma who presents to the Emergency Department complaining of emesis episodes onset yesterday. She states that she was throwing up after she ate dinner. Pt states her asthma exacerbates her vomiting. Pt is using Albuterol at home. Pt mentioned she went to an urgent care and they told her she had high blood pressure. They prescribed her some medication to control her bp but she couldn't afford it. She denies fever and any other symptoms.  Past Medical History:  Diagnosis Date  . Anxiety   . Asthma   . Depression   . Diverticulosis of colon (without mention of hemorrhage)   . Esophageal stricture   . GERD (gastroesophageal reflux disease)   . Heart murmur   . Hypertension   . Internal hemorrhoids without mention of complication   . Osteoarthritis   . Shortness of breath dyspnea    with allergies    Patient Active Problem List   Diagnosis Date Noted  . Memory problem 09/03/2015  . Abnormal CBC 02/05/2015  . Edema 10/15/2014  . Incontinence in female 10/15/2014  . S/P lumbar laminectomy 02/20/2014  . Pre-ulcerative calluses 02/09/2014  . Benign familial tremor 02/09/2014  . Cold sore 10/17/2013  . LBP (low back pain) 07/25/2013  . Heel ulcer ([place]) 03/08/2012  . Rash 12/04/2011  . Neck pain on left side 08/06/2011  . Grief at loss of child 08/06/2011  . Well adult  exam 03/02/2011  . Hyperglycemia 03/02/2011  . Knee pain, right 09/08/2010  . OBSTRUCTIVE SLEEP APNEA 02/14/2010  . SWEATING 02/14/2010  . BRONCHITIS, ACUTE 04/05/2009  . VERTIGO 12/02/2007  . ABNORMAL GLUCOSE NEC 12/02/2007  . OTITIS MEDIA 10/26/2007  . HEADACHE 10/26/2007  . OTHER MALAISE AND FATIGUE 05/11/2007  . Anxiety state 02/15/2007  . Adjustment disorder with mixed anxiety and depressed mood 02/15/2007  . GERD 02/15/2007  . Osteoarthritis 02/15/2007  . POSTHERPETIC TRIGEMINAL NEURALGIA 01/26/2007  . HERPES ZOSTER 01/26/2007  . OBESITY, MORBID 01/26/2007  . MIGRAINE HEADACHE 01/26/2007  . Essential hypertension 01/26/2007  . Asthma 01/26/2007  . URINARY INCONTINENCE, OVERFLOW 01/26/2007    Past Surgical History:  Procedure Laterality Date  . ABDOMINAL HYSTERECTOMY    . BREAST CYST EXCISION Left 1980's  . LUMBAR LAMINECTOMY/DECOMPRESSION MICRODISCECTOMY Bilateral 02/20/2014   Procedure: LUMBAR TWO THREE, LUMBAR THREE FOUR LUMBAR LAMINECTOMY/DECOMPRESSION MICRODISCECTOMY 2 LEVELS;  Surgeon: Eustace Moore, MD;  Location: [facility];  Service: Neurosurgery;  Laterality: Bilateral;  . SPLENECTOMY    . TIBIA FRACTURE SURGERY Right   . TIBIA FRACTURE SURGERY Left   . TOTAL KNEE ARTHROPLASTY Right 2013    OB History    No data available       Home Medications    Prior to Admission medications   Medication Sig Start Date End Date Taking? Authorizing Provider  Albuterol Sulfate (PROAIR RESPICLICK) [id number] (90 BASE) MCG/ACT AEPB Inhale 1-2 puffs into  the lungs 4 (four) times daily as needed. 02/05/15  Yes Evie Lacks Plotnikov, MD  ALPRAZolam Duanne Moron) 0.5 MG tablet Take 1 tablet (0.5 mg total) by mouth 2 (two) times daily as needed. Patient taking differently: Take 0.5 mg by mouth 2 (two) times daily as needed for anxiety.  03/22/14  Yes Evie Lacks Plotnikov, MD  cetirizine (ZYRTEC) 10 MG tablet Take 10 mg by mouth daily.     Yes Historical Provider, MD  Cholecalciferol  (VITAMIN D3) 1000 UNITS tablet Take 2 tablets by mouth once daily    Yes Historical Provider, MD  dexlansoprazole (DEXILANT) 60 MG capsule Take 1 capsule (60 mg total) by mouth daily. 04/26/15  Yes Irene Shipper, MD  diclofenac sodium (VOLTAREN) 1 % GEL Apply 4 g topically 4 (four) times daily. As directed. Patient taking differently: Apply 4 g topically 4 (four) times daily as needed (for pain resulting from spinal stenosis/arthritis.).  04/25/13  Yes Evie Lacks Plotnikov, MD  Fluticasone-Salmeterol (ADVAIR DISKUS) 250-50 MCG/DOSE AEPB Inhale 1 puff into the lungs every 12 (twelve) hours. 05/27/15  Yes Evie Lacks Plotnikov, MD  furosemide (LASIX) 20 MG tablet Take 1-2 tablets (20-40 mg total) by mouth daily as needed for edema. 10/15/14  Yes Cassandria Anger, MD  HYDROcodone-acetaminophen (NORCO) 10-325 MG tablet Take 1 tablet by mouth every 6 (six) hours as needed. Patient taking differently: Take 1 tablet by mouth every 6 (six) hours as needed (for pain.).  02/05/15  Yes Evie Lacks Plotnikov, MD  montelukast (SINGULAIR) 10 MG tablet Take 1 tablet (10 mg total) by mouth daily. 05/27/15  Yes Cassandria Anger, MD  Multiple Vitamin (MULTIVITAMIN) tablet Take 1 tablet by mouth daily.   Yes Historical Provider, MD  Omega-3 Fatty Acids (FISH OIL) 1000 MG CAPS Take 1 capsule by mouth daily.   Yes Historical Provider, MD  ondansetron (ZOFRAN ODT) 8 MG disintegrating tablet Take 1 tablet (8 mg total) by mouth every 8 (eight) hours as needed for nausea or vomiting. 09/20/15  Yes Melynda Ripple, MD  oxybutynin (DITROPAN) 5 MG tablet Take 1 tablet (5 mg total) by mouth every 8 (eight) hours as needed for bladder spasms. 09/03/15  Yes Cassandria Anger, MD  venlafaxine XR (EFFEXOR XR) 75 MG 24 hr capsule Take 1 capsule (75 mg total) by mouth daily with breakfast. 09/03/15  Yes Evie Lacks Plotnikov, MD  verapamil (VERELAN PM) 180 MG 24 hr capsule Take 1 capsule (180 mg total) by mouth at bedtime. 09/03/15  Yes Evie Lacks  Plotnikov, MD  methocarbamol (ROBAXIN) 750 MG tablet Take 1 tablet (750 mg total) by mouth daily as needed for muscle spasms. 02/05/15   Cassandria Anger, MD    Family History Family History  Problem Relation Age of Onset  . Hypertension Mother   . Diabetes type II Mother   . Asthma Mother   . Diabetes Mother   . Prostate cancer Father   . Mental illness Father     dementia  . Colon cancer Neg Hx     Social History Social History  Substance Use Topics  . Smoking status: Never Smoker  . Smokeless tobacco: Never Used  . Alcohol use No     Comment: wine on occ      Allergies   Ace inhibitors; Aspirin; and Citalopram hydrobromide   Review of Systems Review of Systems  Constitutional: Negative for fever.  Respiratory: Positive for shortness of breath.   Gastrointestinal: Positive for nausea and vomiting.  Physical Exam Updated Vital Signs BP 158/82   Pulse 72   Temp 98.8 F (37.1 C)   Resp 18   Ht 5' 1.5" (1.562 m)   Wt 200 lb (90.7 kg)   SpO2 94%   BMI 37.18 kg/m   Physical Exam  Constitutional: She is oriented to person, place, and time. She appears well-developed and well-nourished. No distress.  HENT:  Head: Normocephalic and atraumatic.  Right Ear: Hearing normal.  Left Ear: Hearing normal.  Nose: Nose normal.  Mouth/Throat: Oropharynx is clear and moist and mucous membranes are normal.  Eyes: Conjunctivae and EOM are normal. Pupils are equal, round, and reactive to light.  Neck: Normal range of motion. Neck supple.  Cardiovascular: Regular rhythm, S1 normal and S2 normal.  Exam reveals no gallop and no friction rub.   No murmur heard. Pulmonary/Chest: Effort normal and breath sounds normal. No respiratory distress. She exhibits no tenderness.  Diminished breath sounds bilaterally  Abdominal: Soft. Normal appearance and bowel sounds are normal. There is no hepatosplenomegaly. There is no tenderness. There is no rebound, no guarding, no tenderness at  McBurney's point and negative Murphy's sign. No hernia.  Musculoskeletal: Normal range of motion.  Neurological: She is alert and oriented to person, place, and time. She has normal strength. No cranial nerve deficit or sensory deficit. Coordination normal. GCS eye subscore is 4. GCS verbal subscore is 5. GCS motor subscore is 6.  Skin: Skin is warm, dry and intact. No rash noted. No cyanosis.  Psychiatric: She has a normal mood and affect. Her speech is normal and behavior is normal. Thought content normal.  Nursing note and vitals reviewed.  ED Treatments / Results  Labs (all labs ordered are listed, but only abnormal results are displayed) Labs Reviewed  COMPREHENSIVE METABOLIC PANEL - Abnormal; Notable for the following:       Result Value   Glucose, Bld 123 (*)    ALT 13 (*)    All other components within normal limits  CBC - Abnormal; Notable for the following:    WBC 11.5 (*)    RBC 3.61 (*)    Hemoglobin 10.3 (*)    HCT 32.6 (*)    RDW 17.4 (*)    Platelets 551 (*)    All other components within normal limits  URINALYSIS, ROUTINE W REFLEX MICROSCOPIC (NOT AT [facility]) - Abnormal; Notable for the following:    Color, Urine AMBER (*)    APPearance CLOUDY (*)    Specific Gravity, Urine 1.036 (*)    Bilirubin Urine SMALL (*)    Protein, ur 30 (*)    Leukocytes, UA TRACE (*)    All other components within normal limits  URINE MICROSCOPIC-ADD ON - Abnormal; Notable for the following:    Squamous Epithelial / LPF 6-30 (*)    Bacteria, UA FEW (*)    Casts GRANULAR CAST (*)    All other components within normal limits  LIPASE, BLOOD   DIAGNOSTIC STUDIES: Oxygen Saturation is 94% on RA, nl by my interpretation.    COORDINATION OF CARE: 3:04 AM Discussed treatment plan with pt at bedside and pt agreed to plan.  Radiology Dg Abd Acute W/chest  Result Date: 09/22/2015 CLINICAL DATA:  Pt with a PMHx of asthma who presents to the Emergency Department complaining of emesis  episodes onset yesterday. She states that she was throwing up after she ate dinner. Pt states her asthma exacerbates her vomiting. Pt is using Albuterol at home. Pt mentioned  she went to an urgent care and they told her she had high blood pressure. They prescribed her some medication to control her bp but she couldn't afford it. She denies fever and any other symptoms. Denies any other complaints. Hx diverticulosis, asthma EXAM: DG ABDOMEN ACUTE W/ 1V CHEST COMPARISON:  Chest radiograph, 02/07/2014 FINDINGS: Normal bowel gas pattern. No free air. No evidence of renal or ureteral stones. Frontal chest radiograph shows borderline cardiomegaly. Aorta is uncoiled. No mediastinal or hilar masses. Clear lungs. The degenerative changes of the thoracolumbar spine. IMPRESSION: No acute findings in the abdomen pelvis. No acute cardiopulmonary disease. Electronically Signed   By: Lajean Manes M.D.   On: 09/22/2015 00:28  Procedures Procedures (including critical care time)  Medications Ordered in ED Medications  sodium chloride 0.9 % bolus 1,000 mL (0 mLs Intravenous Stopped 09/22/15 0143)  ondansetron (ZOFRAN) injection 4 mg (4 mg Intravenous Given 09/21/15 2353)  ipratropium-albuterol (DUONEB) 0.5-2.5 (3) MG/3ML nebulizer solution 3 mL (3 mLs Nebulization Given by Other 09/21/15 2335)   Initial Impression / Assessment and Plan / ED Course  I have reviewed the triage vital signs and the nursing notes.  Pertinent labs & imaging results that were available during my care of the patient were reviewed by me and considered in my medical decision making (see chart for details).  Clinical Course  Patient presents to the ER for evaluation of nausea and vomiting. Symptoms have been present for 2 days. She was seen in urgent care yesterday and received IV fluids with improvement, then today has had nausea and vomiting again. She has not had any diarrhea or rectal bleeding. She reports that when she got to the ER and was  triaged to start to feel better and now is not having any pain or nausea or vomiting. She did appear to be mildly dehydrated. She was given IV fluids. Labs are normal. Urinalysis normal. No further vomiting here in the ER. Discharged with symptomatic treatment. She was given a prescription for Zofran ODT at urgent care but her insurance did not cover it and she did not receive any antiemetics for home use.  Final Clinical Impressions(s) / ED Diagnoses   Final diagnoses:  None  Nausea and vomiting  New Prescriptions New Prescriptions   No medications on file    I personally performed the services described in this documentation, which was scribed in my presence. The recorded information has been reviewed and is accurate.      Orpah Greek, MD 09/22/15 [phone]

## 2015-09-22 ENCOUNTER — Emergency Department (HOSPITAL_COMMUNITY): Payer: Medicare Other

## 2015-09-22 DIAGNOSIS — E860 Dehydration: Secondary | ICD-10-CM | POA: Diagnosis not present

## 2015-09-22 LAB — URINE MICROSCOPIC-ADD ON

## 2015-09-22 LAB — URINALYSIS, ROUTINE W REFLEX MICROSCOPIC
GLUCOSE, UA: NEGATIVE mg/dL
HGB URINE DIPSTICK: NEGATIVE
Ketones, ur: NEGATIVE mg/dL
Nitrite: NEGATIVE
PROTEIN: 30 mg/dL — AB
SPECIFIC GRAVITY, URINE: 1.036 — AB (ref 1.005–1.030)
pH: 5.5 (ref 5.0–8.0)

## 2015-09-22 IMAGING — CR DG ABDOMEN ACUTE W/ 1V CHEST
3 series · 3 of 3 positions shown · non-contrast
Comparison: Chest radiograph, [DATE]

CLINICAL DATA: Pt with a PMHx of asthma who presents to the
Emergency Department complaining of emesis episodes onset yesterday.
She states that she was throwing up after she ate dinner. Pt states
her asthma exacerbates her vomiting. Pt is using Albuterol at home.
Pt mentioned she went to an [HOSPITAL] and they told her she had
high blood pressure. They prescribed her some medication to control
her bp but she couldn't afford it. She denies fever and any other
symptoms. Denies any other complaints. Hx diverticulosis, asthma

EXAM:
DG ABDOMEN ACUTE W/ 1V CHEST

[w chest pa]
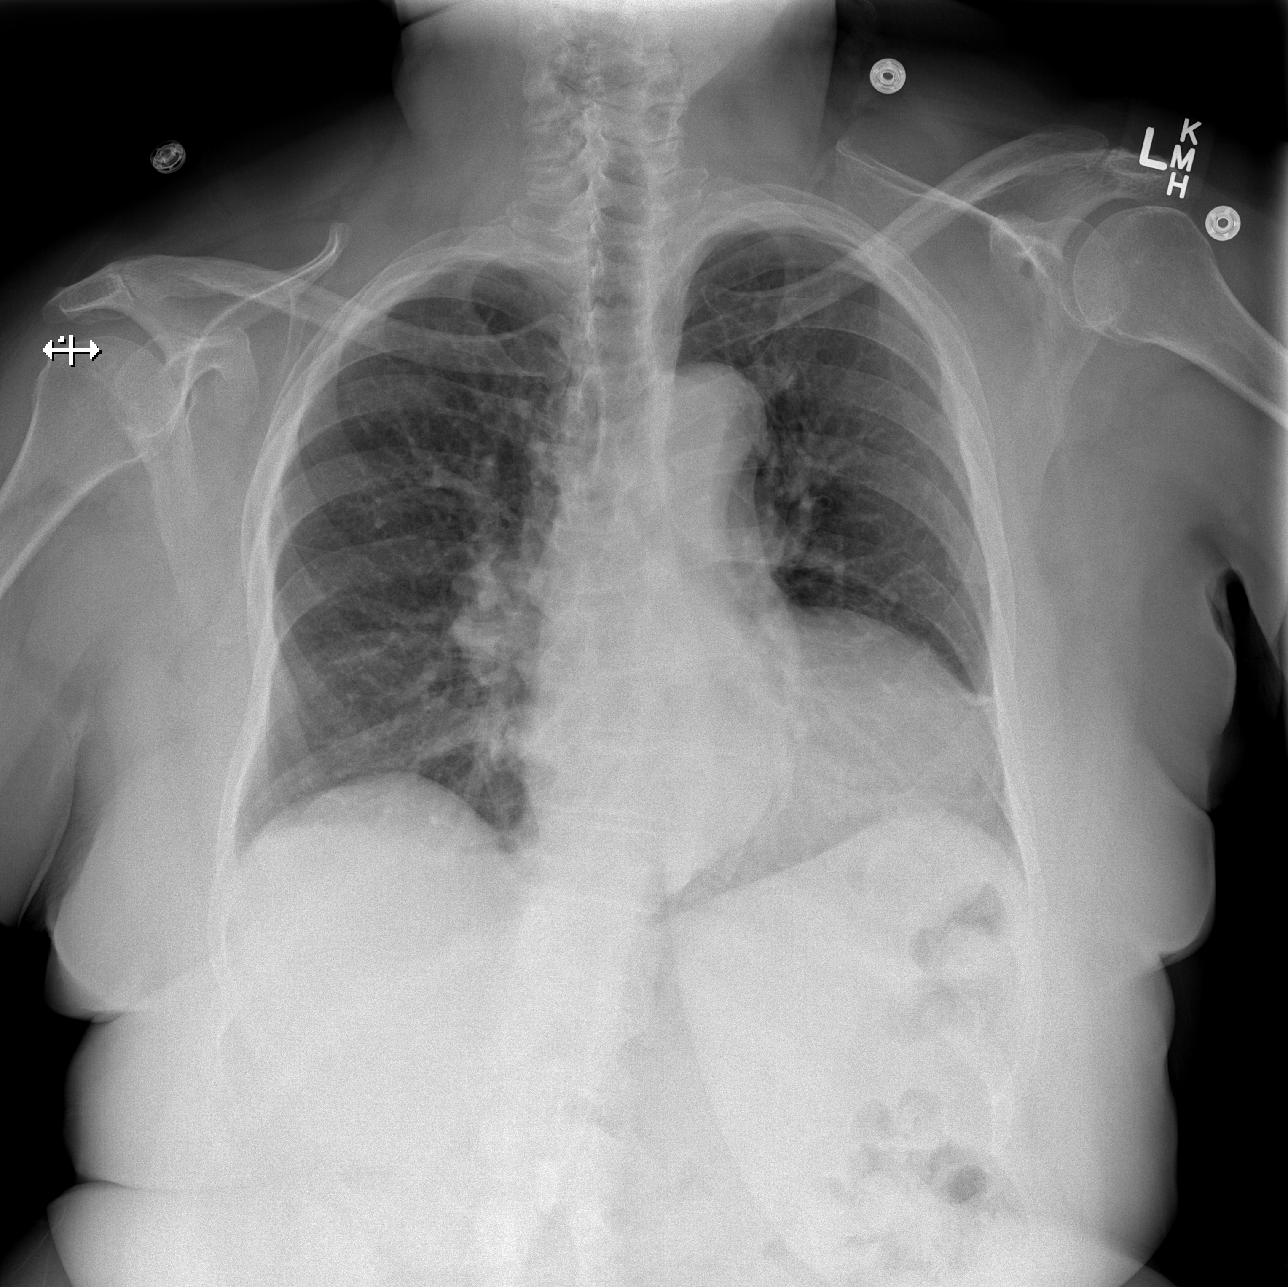

[w abdomen upright]
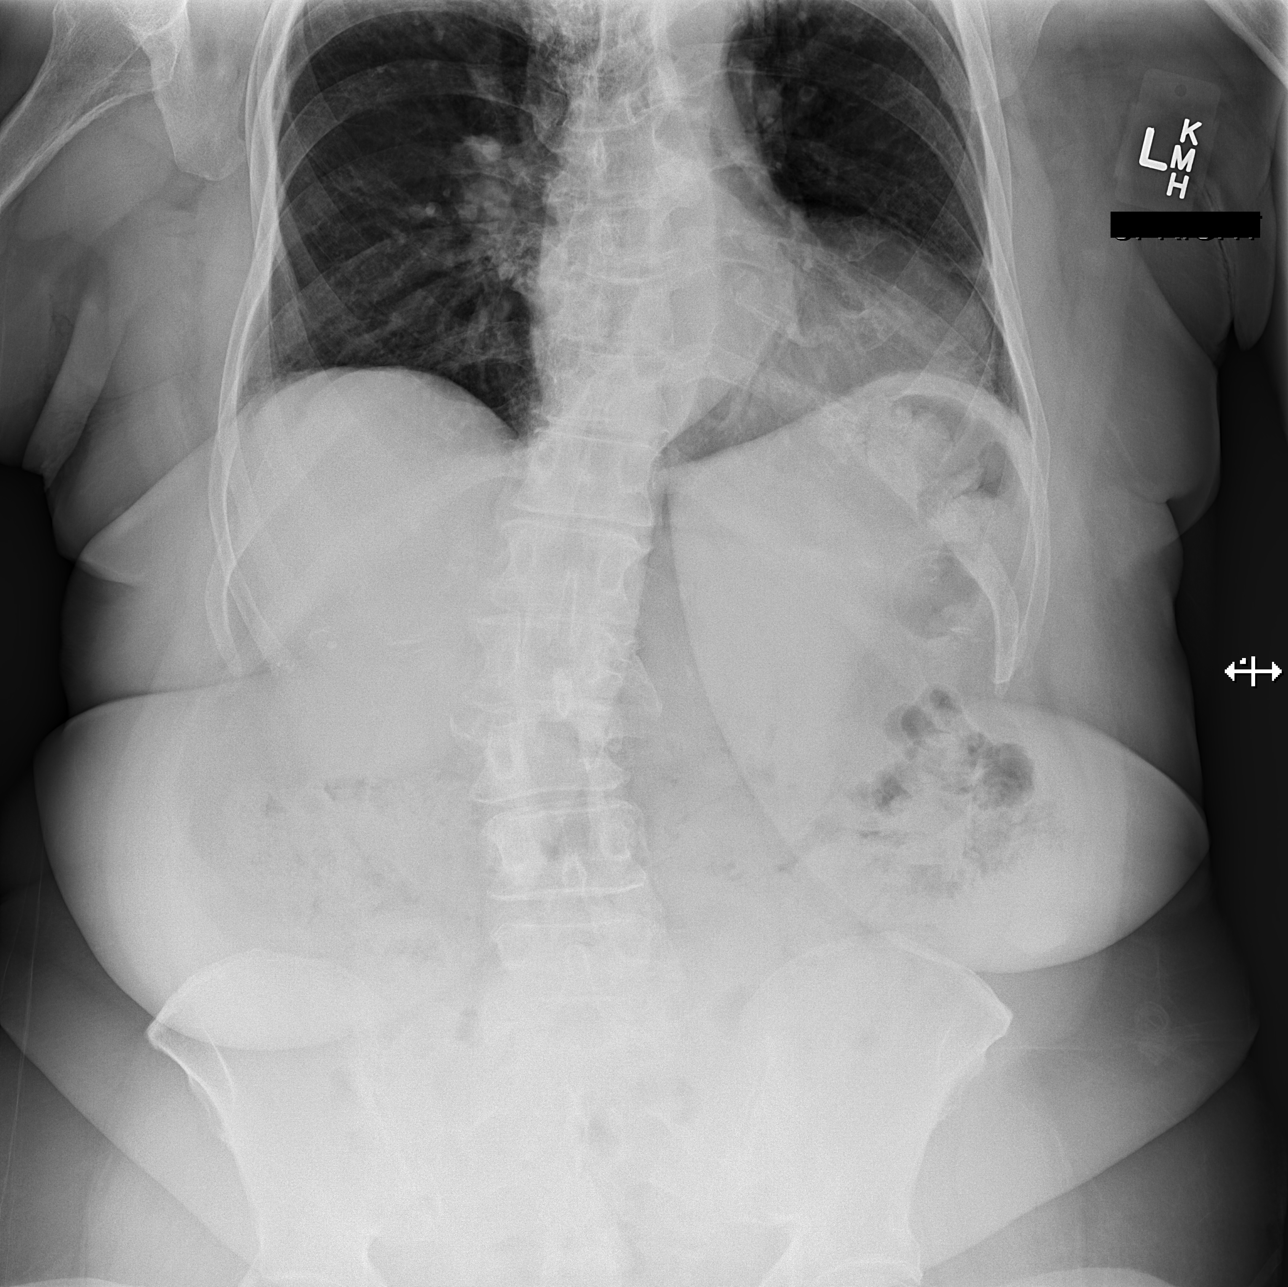

[t abdomen supine]
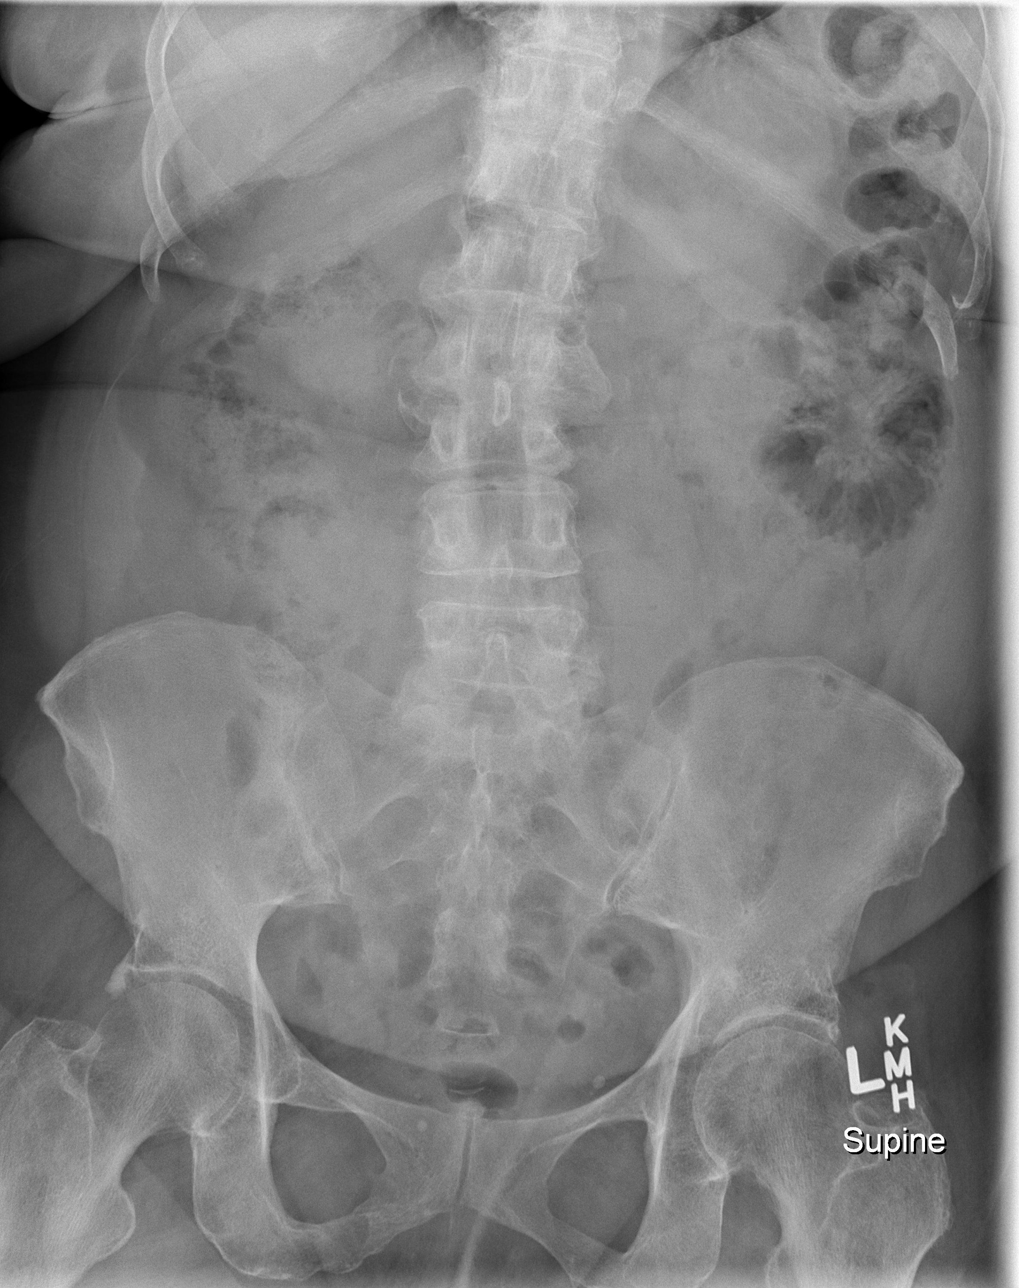

[3 of 3 positions shown; findings below may reference images not displayed]

FINDINGS: Normal bowel gas pattern. No free air. No evidence of renal or
ureteral stones.

Frontal chest radiograph shows borderline cardiomegaly. Aorta is
uncoiled. No mediastinal or hilar masses. Clear lungs.

The degenerative changes of the thoracolumbar spine.
IMPRESSION: No acute findings in the abdomen pelvis. No acute cardiopulmonary
disease.

## 2015-09-22 MED ORDER — PROMETHAZINE HCL 12.5 MG RE SUPP: 12.5000 mg | Freq: Four times a day (QID) | RECTAL | 0 refills | Status: DC | PRN

## 2015-09-22 MED ORDER — ONDANSETRON HCL 4 MG PO TABS: 4.0000 mg | ORAL_TABLET | Freq: Four times a day (QID) | ORAL | 0 refills | Status: DC

## 2015-10-03 ENCOUNTER — Telehealth: Payer: Self-pay | Admitting: *Deleted

## 2015-10-03 NOTE — Telephone Encounter (Signed)
We have gotten correspondence from [facility] that patient has been found eligible and is enrolled to receive free medication until 02/23/16. Case [id number]. [employer] phone number is [phone] in the event that they need to be contacted.

## 2015-10-09 DIAGNOSIS — Z961 Presence of intraocular lens: Secondary | ICD-10-CM | POA: Insufficient documentation

## 2015-11-01 ENCOUNTER — Encounter: Payer: Self-pay | Admitting: Rheumatology

## 2015-11-07 ENCOUNTER — Encounter: Payer: Self-pay | Admitting: Internal Medicine

## 2015-11-12 LAB — HM MAMMOGRAPHY

## 2015-11-13 ENCOUNTER — Encounter: Payer: Self-pay | Admitting: Internal Medicine

## 2015-12-03 ENCOUNTER — Other Ambulatory Visit: Payer: Medicare Other

## 2015-12-03 ENCOUNTER — Other Ambulatory Visit (INDEPENDENT_AMBULATORY_CARE_PROVIDER_SITE_OTHER): Payer: Medicare Other

## 2015-12-03 ENCOUNTER — Ambulatory Visit (INDEPENDENT_AMBULATORY_CARE_PROVIDER_SITE_OTHER): Payer: Medicare Other | Admitting: Internal Medicine

## 2015-12-03 ENCOUNTER — Encounter: Payer: Self-pay | Admitting: Internal Medicine

## 2015-12-03 VITALS — BP 122/80 | HR 98 | Temp 98.2°F | Wt 202.0 lb

## 2015-12-03 DIAGNOSIS — R32 Unspecified urinary incontinence: Secondary | ICD-10-CM

## 2015-12-03 DIAGNOSIS — F4323 Adjustment disorder with mixed anxiety and depressed mood: Secondary | ICD-10-CM

## 2015-12-03 DIAGNOSIS — M5440 Lumbago with sciatica, unspecified side: Secondary | ICD-10-CM

## 2015-12-03 DIAGNOSIS — I10 Essential (primary) hypertension: Secondary | ICD-10-CM

## 2015-12-03 DIAGNOSIS — D509 Iron deficiency anemia, unspecified: Secondary | ICD-10-CM

## 2015-12-03 DIAGNOSIS — M25561 Pain in right knee: Secondary | ICD-10-CM

## 2015-12-03 DIAGNOSIS — G8929 Other chronic pain: Secondary | ICD-10-CM

## 2015-12-03 DIAGNOSIS — Z23 Encounter for immunization: Secondary | ICD-10-CM

## 2015-12-03 DIAGNOSIS — R8290 Unspecified abnormal findings in urine: Secondary | ICD-10-CM

## 2015-12-03 DIAGNOSIS — R7309 Other abnormal glucose: Secondary | ICD-10-CM

## 2015-12-03 LAB — CBC WITH DIFFERENTIAL/PLATELET
Basophils Relative: 0.9 % (ref 0.0–3.0)
Eosinophils Relative: 4.2 % (ref 0.0–5.0)
HCT: 34.3 % — ABNORMAL LOW (ref 36.0–46.0)
HEMOGLOBIN: 10.6 g/dL — AB (ref 12.0–15.0)
Lymphs Abs: 4.1 10*3/uL — ABNORMAL HIGH (ref 0.7–4.0)
MCHC: 31.1 g/dL (ref 30.0–36.0)
MCV: 90 fl (ref 78.0–100.0)
Monocytes Absolute: 0.9 10*3/uL (ref 0.1–1.0)
Platelets: 620 10*3/uL — ABNORMAL HIGH (ref 150.0–400.0)
RBC: 3.81 Mil/uL — AB (ref 3.87–5.11)
RDW: 19.5 % — AB (ref 11.5–15.5)
WBC: 13.4 10*3/uL — AB (ref 4.0–10.5)

## 2015-12-03 LAB — URINALYSIS, ROUTINE W REFLEX MICROSCOPIC
KETONES UR: 15 — AB
LEUKOCYTES UA: NEGATIVE
Nitrite: NEGATIVE
Specific Gravity, Urine: >=1.03 — AB (ref 1.000–1.030)
TOTAL PROTEIN, URINE-UPE24: 100 — AB
URINE GLUCOSE: NEGATIVE
Urobilinogen, UA: 0.2 (ref 0.0–1.0)
pH: 5 (ref 5.0–8.0)

## 2015-12-03 LAB — BASIC METABOLIC PANEL
BUN: 12 mg/dL (ref 6–23)
CALCIUM: 9.4 mg/dL (ref 8.4–10.5)
CO2: 34 meq/L — AB (ref 19–32)
CREATININE: 1.07 mg/dL (ref 0.40–1.20)
Chloride: 101 mEq/L (ref 96–112)
GFR: 65.2 mL/min (ref >60.00)
Glucose, Bld: 88 mg/dL (ref 70–99)
Potassium: 4 mEq/L (ref 3.5–5.1)
SODIUM: 140 meq/L (ref 135–145)

## 2015-12-03 LAB — HEMOGLOBIN A1C: Hgb A1c MFr Bld: 6.1 % (ref 4.6–6.5)

## 2015-12-03 LAB — SEDIMENTATION RATE: SED RATE: 130 mm/h — AB (ref 0–30)

## 2015-12-03 MED ORDER — TOLTERODINE TARTRATE ER 4 MG PO CP24: 4.0000 mg | ORAL_CAPSULE | Freq: Every day | ORAL | 3 refills | Status: DC

## 2015-12-03 MED ORDER — VENLAFAXINE HCL ER 150 MG PO CP24: 150.0000 mg | ORAL_CAPSULE | Freq: Every day | ORAL | 5 refills | Status: DC

## 2015-12-03 NOTE — Assessment & Plan Note (Signed)
Norco  Potential benefits of a long term opioids use as well as potential risks (i.e. addiction risk, apnea etc) and complications (i.e. Somnolence, constipation and others) were explained to the patient and were aknowledged. 

## 2015-12-03 NOTE — Addendum Note (Signed)
Addended by: Cresenciano Lick on: 12/03/2015 04:47 PM   Modules accepted: Orders

## 2015-12-03 NOTE — Assessment & Plan Note (Signed)
10/17 Detrol LA UA

## 2015-12-03 NOTE — Progress Notes (Signed)
Pre visit review using our clinic review tool, if applicable. No additional management support is needed unless otherwise documented below in the visit note. 

## 2015-12-03 NOTE — Progress Notes (Signed)
Subjective:  Patient ID: Mallory Klein, female    DOB: 1945-04-18  Age: 70 y.o. MRN: [id number]  CC: No chief complaint on file.   HPI Mallory Klein presents for arthritis, asthma, LBP - not better, depression, elevated ESR.  F/u depression - not better: not leaving her bedroom on many days C/o urinary incontinence - x years - worse   Outpatient Medications Prior to Visit  Medication Sig Dispense Refill  . Albuterol Sulfate (PROAIR RESPICLICK) 156 (90 BASE) MCG/ACT AEPB Inhale 1-2 puffs into the lungs 4 (four) times daily as needed. 1 each 11  . ALPRAZolam (XANAX) 0.5 MG tablet Take 1 tablet (0.5 mg total) by mouth 2 (two) times daily as needed. (Patient taking differently: Take 0.5 mg by mouth 2 (two) times daily as needed for anxiety. ) 180 tablet 0  . cetirizine (ZYRTEC) 10 MG tablet Take 10 mg by mouth daily.      . Cholecalciferol (VITAMIN D3) 1000 UNITS tablet Take 2 tablets by mouth once daily     . dexlansoprazole (DEXILANT) 60 MG capsule Take 1 capsule (60 mg total) by mouth daily. 90 capsule 3  . diclofenac sodium (VOLTAREN) 1 % GEL Apply 4 g topically 4 (four) times daily. As directed. (Patient taking differently: Apply 4 g topically 4 (four) times daily as needed (for pain resulting from spinal stenosis/arthritis.). ) 200 g 3  . Fluticasone-Salmeterol (ADVAIR DISKUS) 250-50 MCG/DOSE AEPB Inhale 1 puff into the lungs every 12 (twelve) hours. 60 each 11  . furosemide (LASIX) 20 MG tablet Take 1-2 tablets (20-40 mg total) by mouth daily as needed for edema. 60 tablet 3  . HYDROcodone-acetaminophen (NORCO) 10-325 MG tablet Take 1 tablet by mouth every 6 (six) hours as needed. (Patient taking differently: Take 1 tablet by mouth every 6 (six) hours as needed (for pain.). ) 120 tablet 0  . methocarbamol (ROBAXIN) 750 MG tablet Take 1 tablet (750 mg total) by mouth daily as needed for muscle spasms. 30 tablet 3  . montelukast (SINGULAIR) 10 MG tablet Take 1 tablet (10 mg total)  by mouth daily. 90 tablet 3  . Multiple Vitamin (MULTIVITAMIN) tablet Take 1 tablet by mouth daily.    . Omega-3 Fatty Acids (FISH OIL) 1000 MG CAPS Take 1 capsule by mouth daily.    . ondansetron (ZOFRAN ODT) 8 MG disintegrating tablet Take 1 tablet (8 mg total) by mouth every 8 (eight) hours as needed for nausea or vomiting. 20 tablet 0  . ondansetron (ZOFRAN) 4 MG tablet Take 1 tablet (4 mg total) by mouth every 6 (six) hours. 12 tablet 0  . oxybutynin (DITROPAN) 5 MG tablet Take 1 tablet (5 mg total) by mouth every 8 (eight) hours as needed for bladder spasms. 90 tablet 3  . promethazine (PHENERGAN) 12.5 MG suppository Place 1 suppository (12.5 mg total) rectally every 6 (six) hours as needed for nausea or vomiting. 12 suppository 0  . venlafaxine XR (EFFEXOR XR) 75 MG 24 hr capsule Take 1 capsule (75 mg total) by mouth daily with breakfast. 30 capsule 5  . verapamil (VERELAN PM) 180 MG 24 hr capsule Take 1 capsule (180 mg total) by mouth at bedtime. 90 capsule 3   Facility-Administered Medications Prior to Visit  Medication Dose Route Frequency Provider Last Rate Last Dose  . methylPREDNISolone acetate (DEPO-MEDROL) injection 40 mg  40 mg Intra-articular Once Cassandria Anger, MD        ROS Review of Systems  Constitutional: Negative for  activity change, appetite change, chills, fatigue and unexpected weight change.  HENT: Negative for congestion, mouth sores and sinus pressure.   Eyes: Negative for visual disturbance.  Respiratory: Negative for cough and chest tightness.   Gastrointestinal: Negative for abdominal pain and nausea.  Genitourinary: Positive for enuresis, frequency and urgency. Negative for difficulty urinating and vaginal pain.  Musculoskeletal: Negative for back pain and gait problem.  Skin: Negative for pallor and rash.  Neurological: Negative for dizziness, tremors, weakness, numbness and headaches.  Psychiatric/Behavioral: Positive for behavioral problems and  decreased concentration. Negative for confusion, sleep disturbance and suicidal ideas. The patient is nervous/anxious.     Objective:  BP 122/80   Pulse 98   Temp 98.2 F (36.8 C) (Oral)   Wt 202 lb (91.6 kg)   SpO2 94%   BMI 37.55 kg/m   BP Readings from Last 3 Encounters:  12/03/15 122/80  09/22/15 155/88  09/20/15 160/80    Wt Readings from Last 3 Encounters:  12/03/15 202 lb (91.6 kg)  09/21/15 200 lb (90.7 kg)  09/03/15 206 lb (93.4 kg)    Physical Exam  Constitutional: She appears well-developed. No distress.  HENT:  Head: Normocephalic.  Right Ear: External ear normal.  Left Ear: External ear normal.  Nose: Nose normal.  Mouth/Throat: Oropharynx is clear and moist.  Eyes: Conjunctivae are normal. Pupils are equal, round, and reactive to light. Right eye exhibits no discharge. Left eye exhibits no discharge.  Neck: Normal range of motion. Neck supple. No JVD present. No tracheal deviation present. No thyromegaly present.  Cardiovascular: Normal rate, regular rhythm and normal heart sounds.   Pulmonary/Chest: No stridor. No respiratory distress. She has no wheezes.  Abdominal: Soft. Bowel sounds are normal. She exhibits no distension and no mass. There is no tenderness. There is no rebound and no guarding.  Musculoskeletal: She exhibits tenderness. She exhibits no edema.  Lymphadenopathy:    She has no cervical adenopathy.  Neurological: She displays normal reflexes. No cranial nerve deficit. She exhibits normal muscle tone. Coordination abnormal.  Skin: No rash noted. No erythema.  Psychiatric: She has a normal mood and affect. Her behavior is normal. Judgment and thought content normal.  LS tender and B knees and hips are tender and stiff  Lab Results  Component Value Date   WBC 11.5 (H) 09/21/2015   HGB 10.3 (L) 09/21/2015   HCT 32.6 (L) 09/21/2015   PLT 551 (H) 09/21/2015   GLUCOSE 123 (H) 09/21/2015   CHOL 219 (H) 02/05/2015   TRIG 69.0 02/05/2015    HDL 73.70 02/05/2015   LDLDIRECT 109.8 02/10/2010   LDLCALC 132 (H) 02/05/2015   ALT 13 (L) 09/21/2015   AST 20 09/21/2015   NA 139 09/21/2015   K 3.9 09/21/2015   CL 102 09/21/2015   CREATININE 0.86 09/21/2015   BUN 9 09/21/2015   CO2 29 09/21/2015   TSH 0.84 09/03/2015   INR 0.97 02/07/2014   HGBA1C 6.1 02/05/2015    Dg Abd Acute W/chest  Result Date: 09/22/2015 CLINICAL DATA:  Pt with a PMHx of asthma who presents to the Emergency Department complaining of emesis episodes onset yesterday. She states that she was throwing up after she ate dinner. Pt states her asthma exacerbates her vomiting. Pt is using Albuterol at home. Pt mentioned she went to an urgent care and they told her she had high blood pressure. They prescribed her some medication to control her bp but she couldn't afford it. She denies fever and  any other symptoms. Denies any other complaints. Hx diverticulosis, asthma EXAM: DG ABDOMEN ACUTE W/ 1V CHEST COMPARISON:  Chest radiograph, 02/07/2014 FINDINGS: Normal bowel gas pattern. No free air. No evidence of renal or ureteral stones. Frontal chest radiograph shows borderline cardiomegaly. Aorta is uncoiled. No mediastinal or hilar masses. Clear lungs. The degenerative changes of the thoracolumbar spine. IMPRESSION: No acute findings in the abdomen pelvis. No acute cardiopulmonary disease. Electronically Signed   By: Lajean Manes M.D.   On: 09/22/2015 00:28   Assessment & Plan:   There are no diagnoses linked to this encounter. I am having Ms. Rayos maintain her cholecalciferol, cetirizine, multivitamin, diclofenac sodium, Fish Oil, ALPRAZolam, furosemide, HYDROcodone-acetaminophen, methocarbamol, Albuterol Sulfate, dexlansoprazole, Fluticasone-Salmeterol, montelukast, oxybutynin, verapamil, venlafaxine XR, ondansetron, ondansetron, and promethazine. We will continue to administer methylPREDNISolone acetate.  No orders of the defined types were placed in this  encounter.    Follow-up: No Follow-up on file.  Walker Kehr, MD

## 2015-12-03 NOTE — Assessment & Plan Note (Signed)
Wt Readings from Last 3 Encounters:  12/03/15 202 lb (91.6 kg)  09/21/15 200 lb (90.7 kg)  09/03/15 206 lb (93.4 kg)

## 2015-12-03 NOTE — Assessment & Plan Note (Signed)
Verapamil 

## 2015-12-03 NOTE — Assessment & Plan Note (Signed)
Increase Effexor XR to 150 mg/d

## 2015-12-04 ENCOUNTER — Ambulatory Visit: Payer: Self-pay | Admitting: Rheumatology

## 2015-12-09 ENCOUNTER — Encounter: Payer: Self-pay | Admitting: Internal Medicine

## 2015-12-17 ENCOUNTER — Encounter: Payer: Self-pay | Admitting: Internal Medicine

## 2015-12-18 ENCOUNTER — Other Ambulatory Visit: Payer: Self-pay | Admitting: Internal Medicine

## 2015-12-18 DIAGNOSIS — R945 Abnormal results of liver function studies: Principal | ICD-10-CM

## 2015-12-18 DIAGNOSIS — R7989 Other specified abnormal findings of blood chemistry: Secondary | ICD-10-CM

## 2015-12-31 ENCOUNTER — Other Ambulatory Visit: Payer: Medicare Other

## 2015-12-31 ENCOUNTER — Ambulatory Visit
Admission: RE | Admit: 2015-12-31 | Discharge: 2015-12-31 | Disposition: A | Discharge status: Home / Self Care | Payer: Medicare Other | Source: Ambulatory Visit | Attending: Internal Medicine | Admitting: Internal Medicine

## 2015-12-31 IMAGING — US US ABDOMEN COMPLETE
1 series · 13 of 17 positions shown · non-contrast
Comparison: None.

CLINICAL DATA: 70-year-old female with abnormal urinalysis. Initial
encounter.

EXAM:
ABDOMEN ULTRASOUND COMPLETE

[Series 1: us abdomen complete · 0.24mm/px · 13 of 17 slices shown]
[im 1/17]
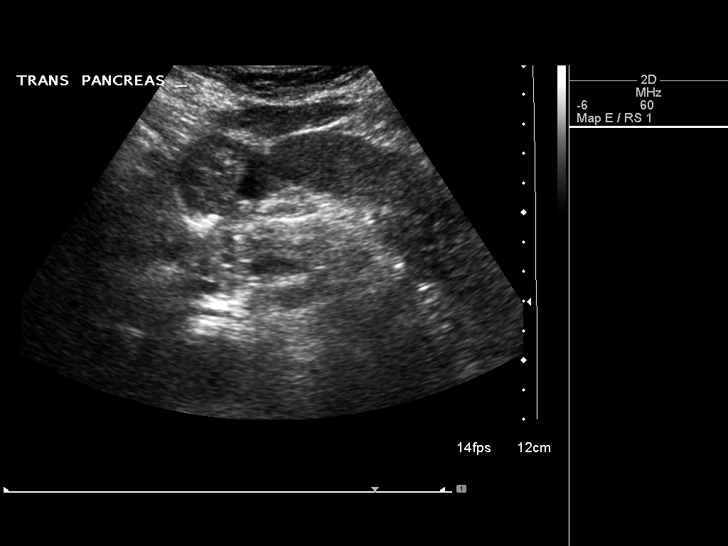
[im 2/17]
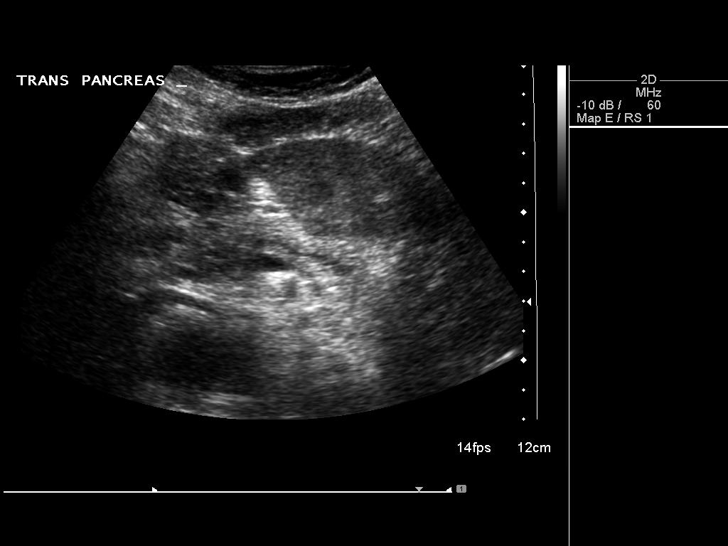
[im 4/17]
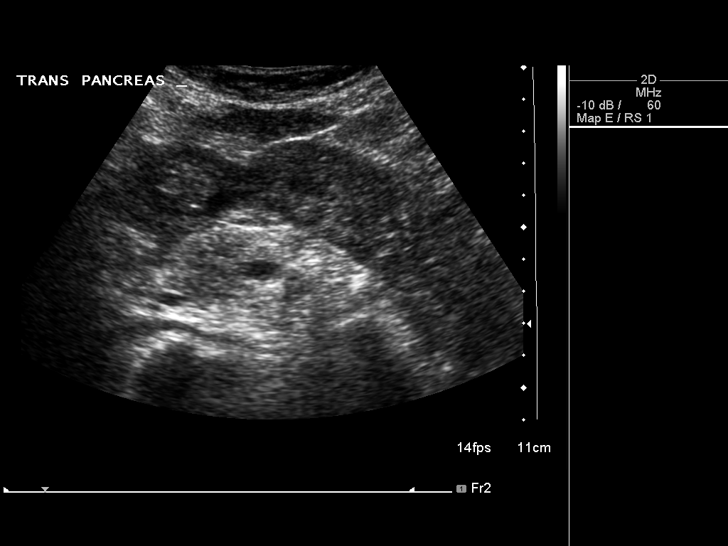
[im 5/17]
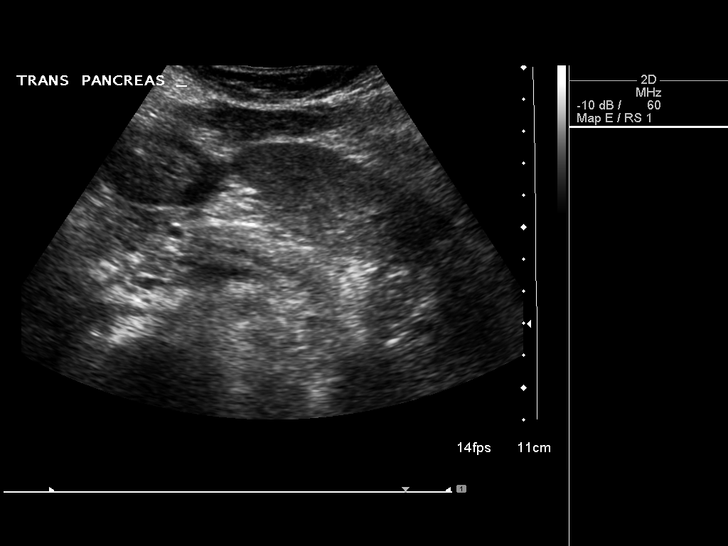
[im 6/17]
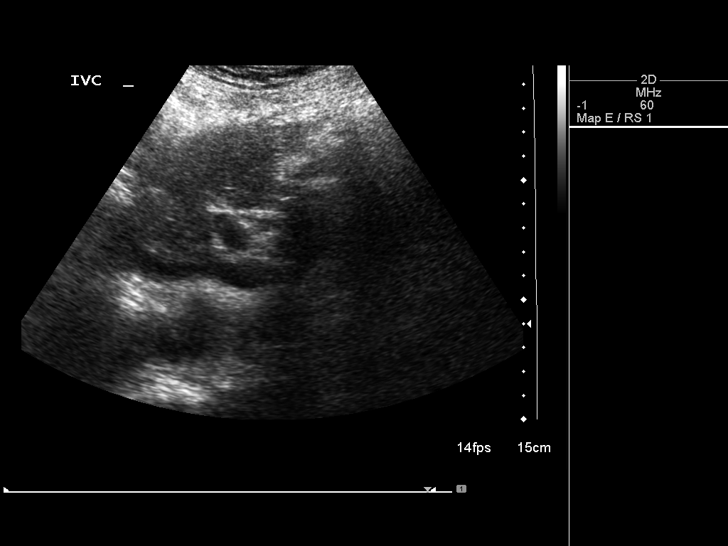
[im 8/17]
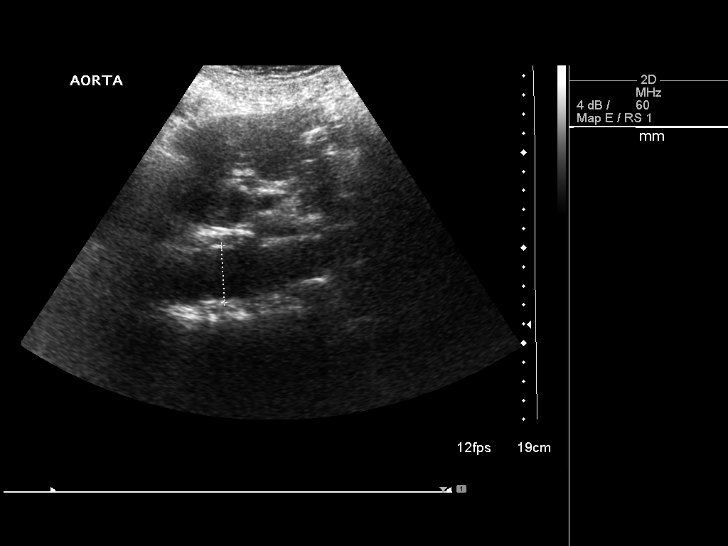
[im 9/17]
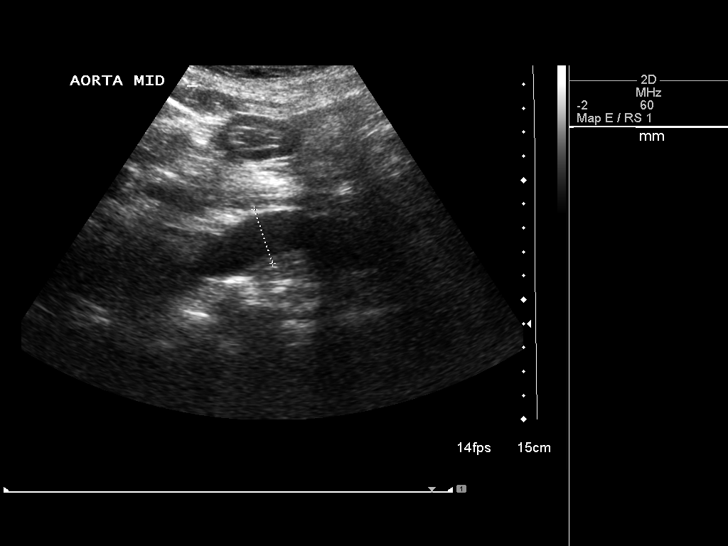
[im 10/17]
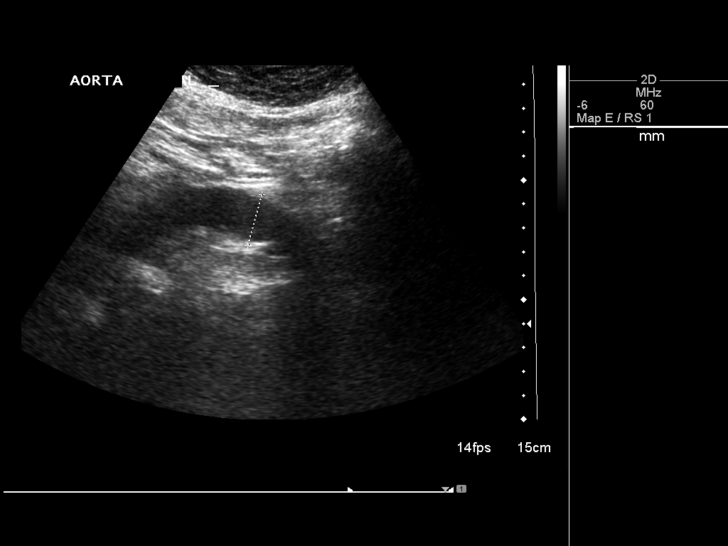
[im 12/17]
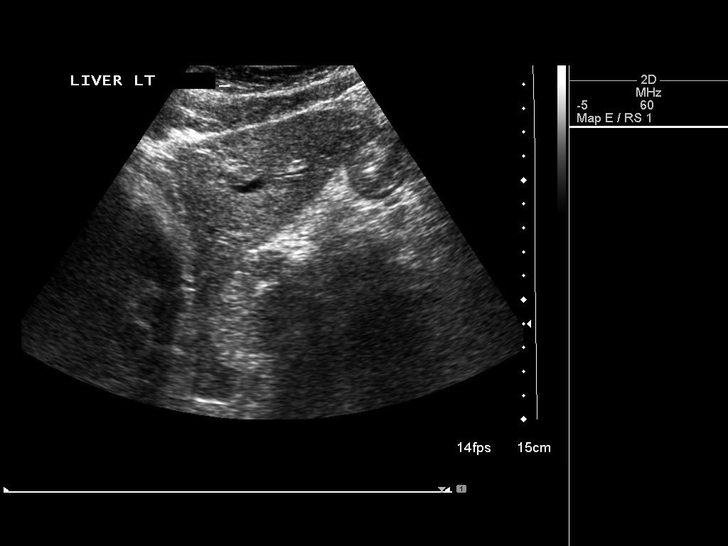
[im 13/17]
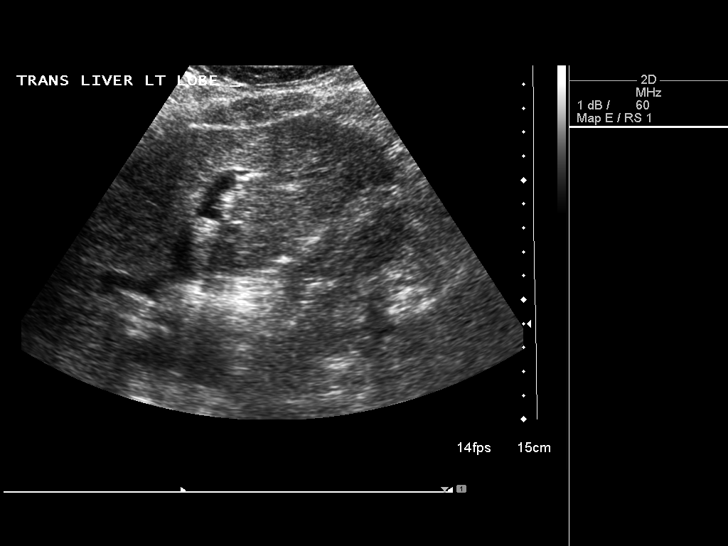
[im 14/17]
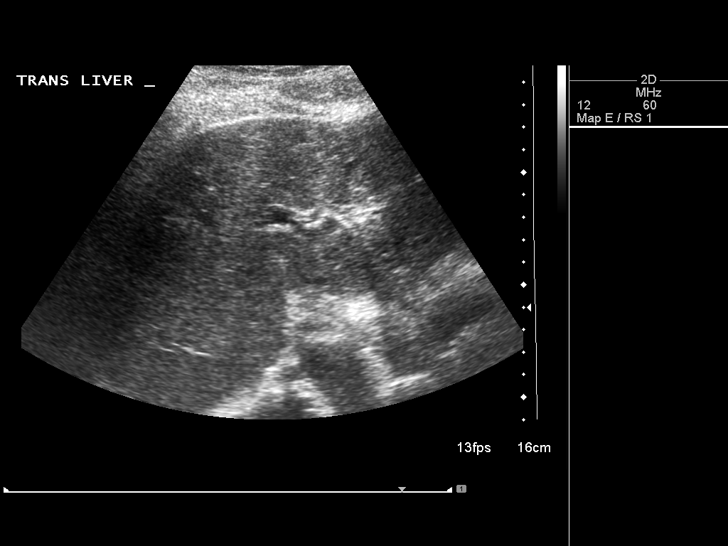
[im 16/17]
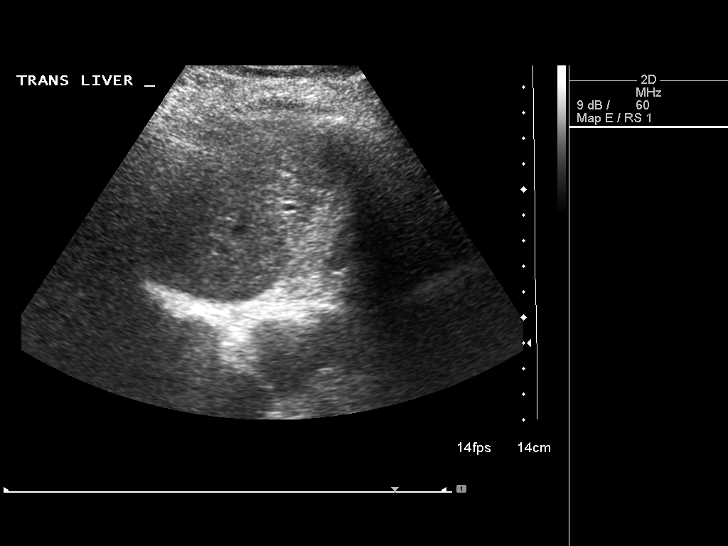
[im 17/17]
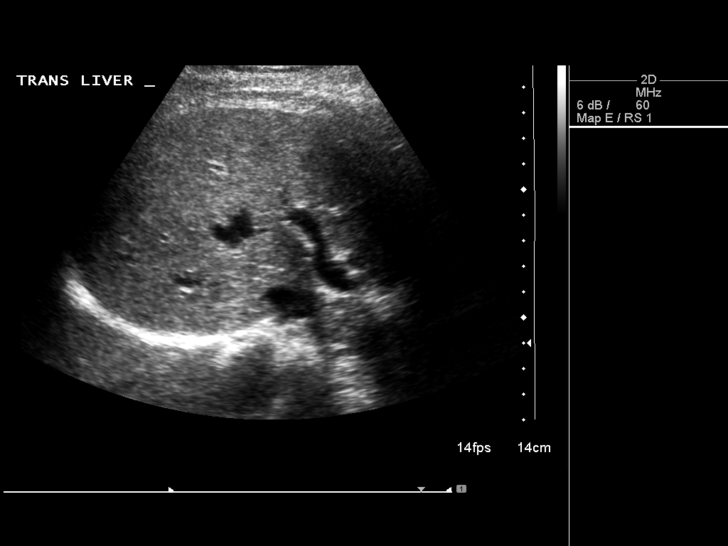

[13 of 17 positions shown; findings below may reference images not displayed]

FINDINGS: Gallbladder: No gallstones or wall thickening visualized. No
sonographic Murphy sign noted by sonographer.

Common bile duct: Diameter: 4.6 mm.

Liver: No focal lesion identified. Within normal limits in
parenchymal echogenicity.

IVC: Infrahepatic aspect not visualized secondary to bowel gas.

Pancreas: Evaluation of pancreatic tail limited by bowel gas.
Portions of the pancreas visualized unremarkable.

Spleen: Post splenectomy. 2.4 cm structure left upper quadrant may
represent post splenectomy splenule.

Right Kidney: Length: 10.5 cm. Echogenicity within normal limits. No
mass or hydronephrosis visualized.

Left Kidney: Length: 11.1 cm. No hydronephrosis. 7 mm lower pole
cyst..

Abdominal aorta: Evaluation limited by bowel gas. Atherosclerotic
changes. Maximal AP dimension 3 cm.

Other findings: None.
IMPRESSION: Post splenectomy. 2.4 cm structure left upper quadrant probably
splenule.

No hydronephrosis.  7 mm left lower pole cyst.

Evaluation of the aorta limited by bowel gas. Maximal AP dimension 3
cm.

Limited evaluation of pancreatic tail and infrahepatic aspect of
inferior vena cava secondary to bowel gas.

Remainder of exam unremarkable.

## 2015-12-31 IMAGING — US US ABDOMEN COMPLETE
1 series · 13 of 25 positions shown · non-contrast
Comparison: None.

CLINICAL DATA: 70-year-old female with abnormal urinalysis. Initial
encounter.

EXAM:
ABDOMEN ULTRASOUND COMPLETE

[Series 1: us abdomen complete · 0.28mm/px · 13 of 60 slices shown]
[im 1/60]
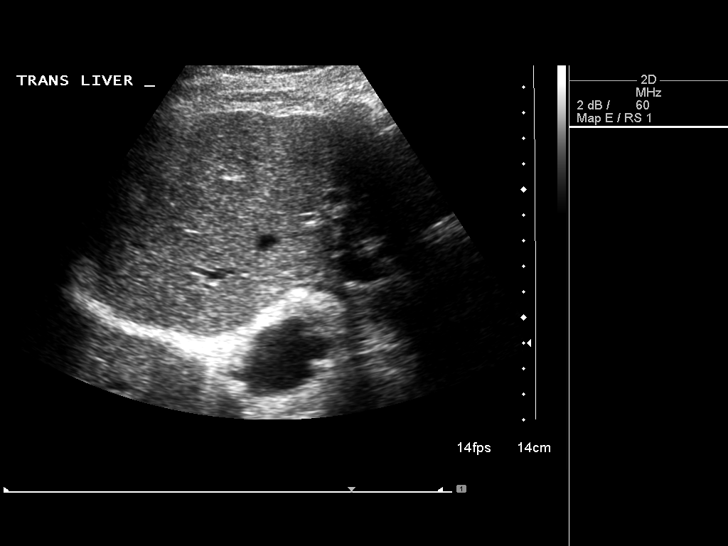
[im 5/60]
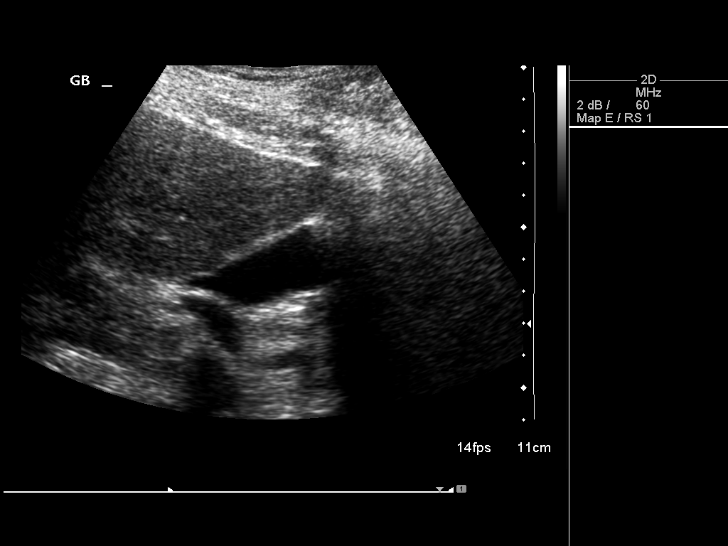
[im 10/60]
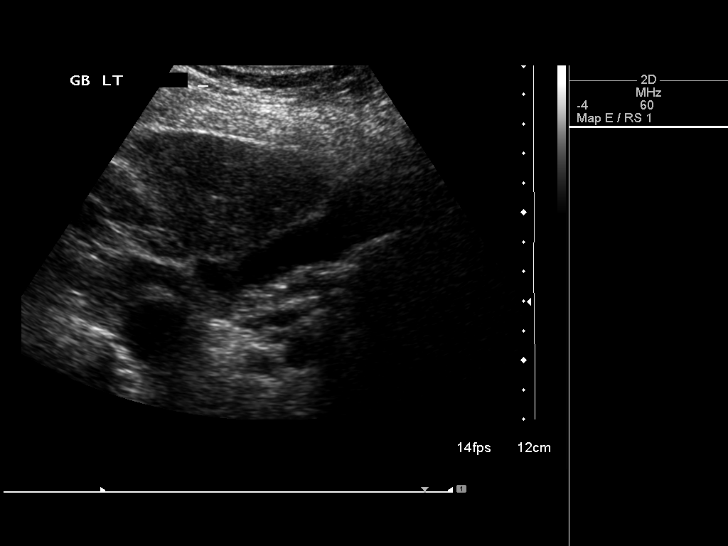
[im 15/60]
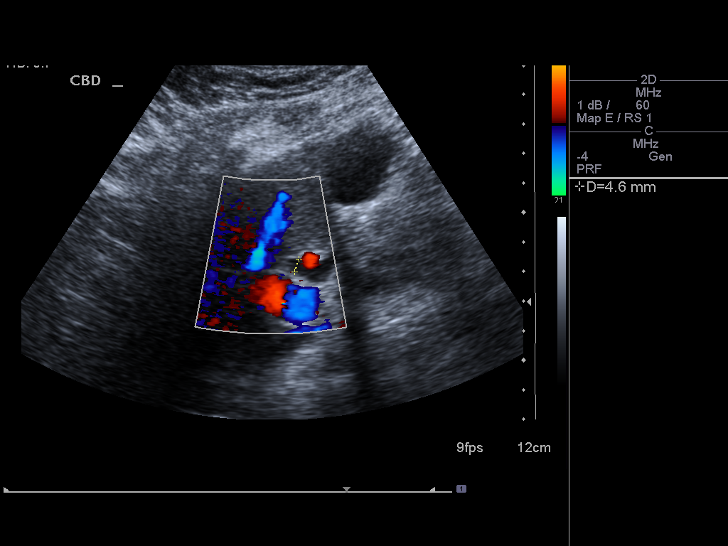
[im 20/60]
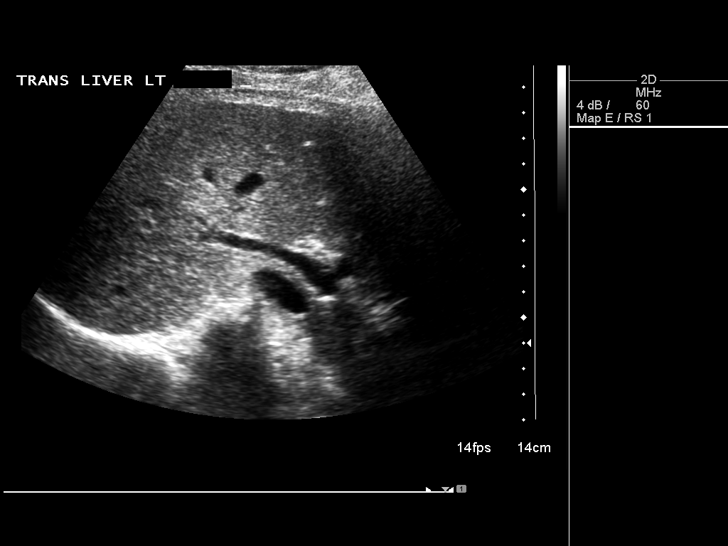
[im 25/60]
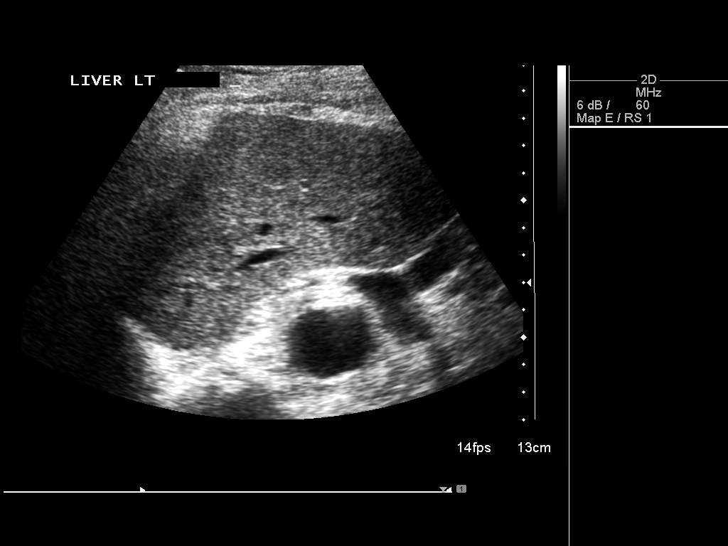
[im 30/60]
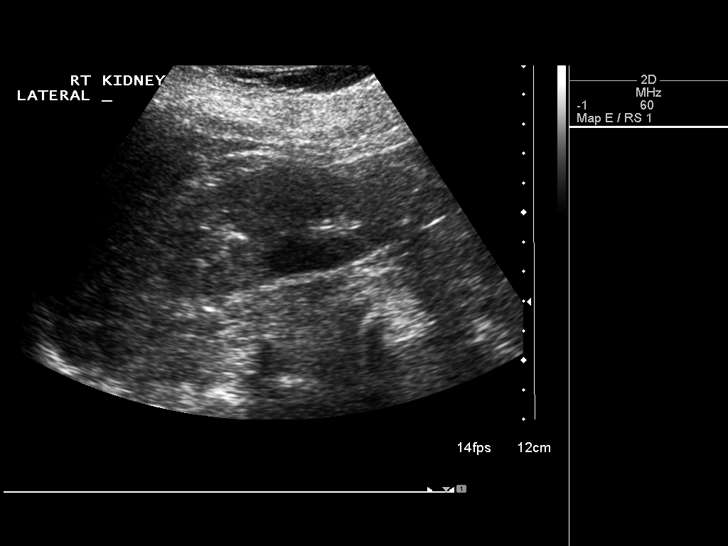
[im 35/60]
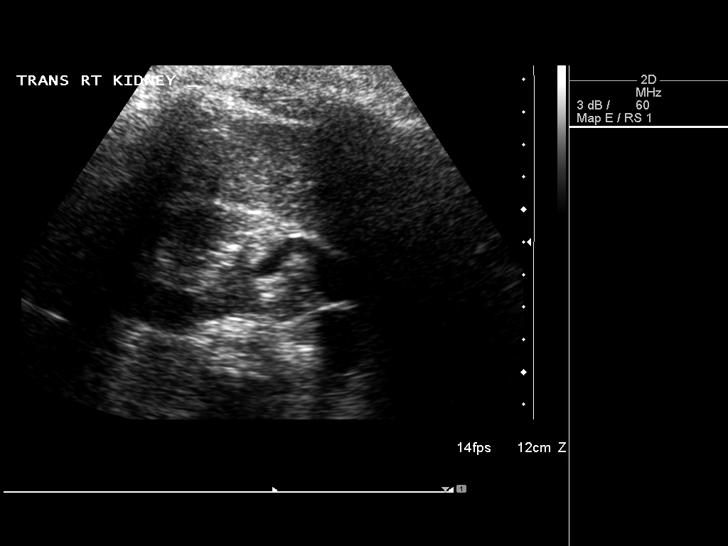
[im 40/60]
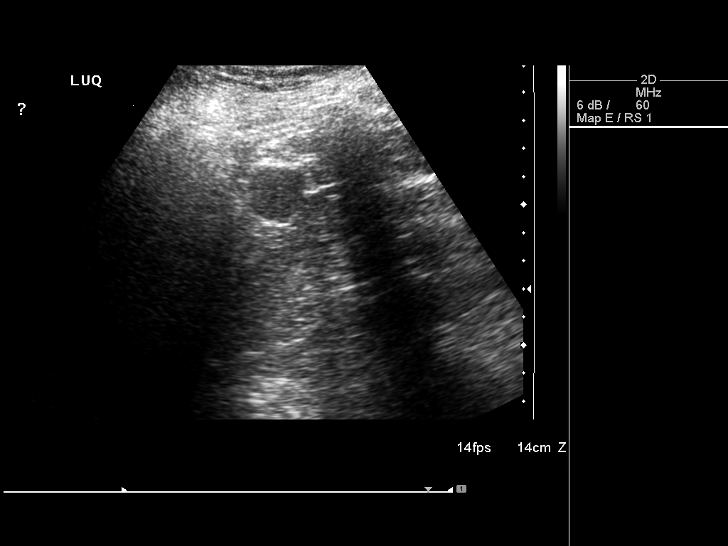
[im 45/60]
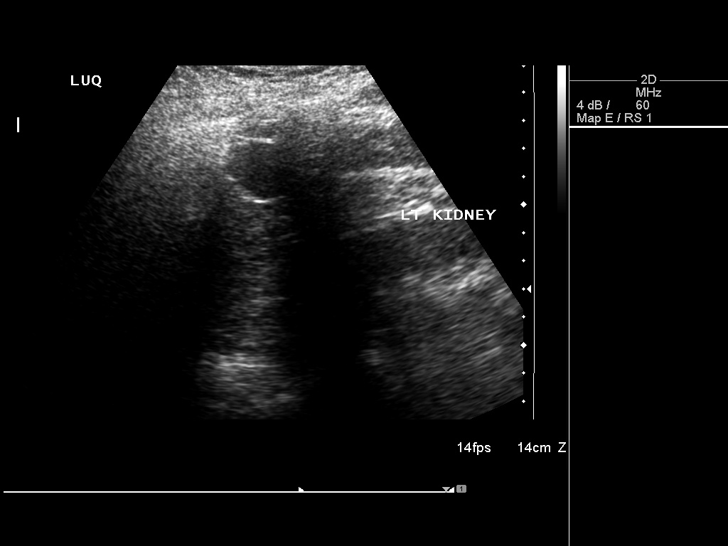
[im 50/60]
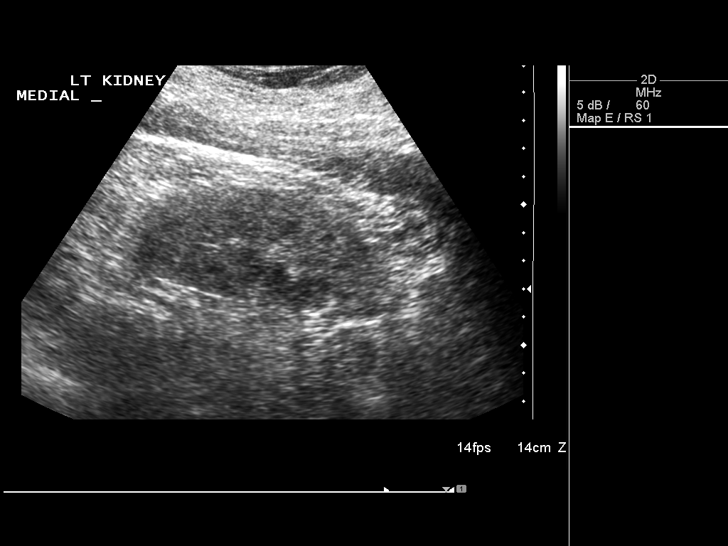
[im 55/60]
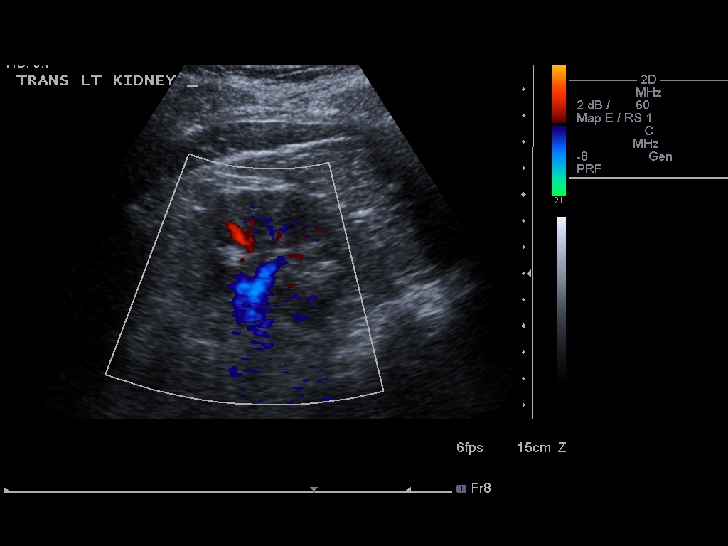
[im 60/60]
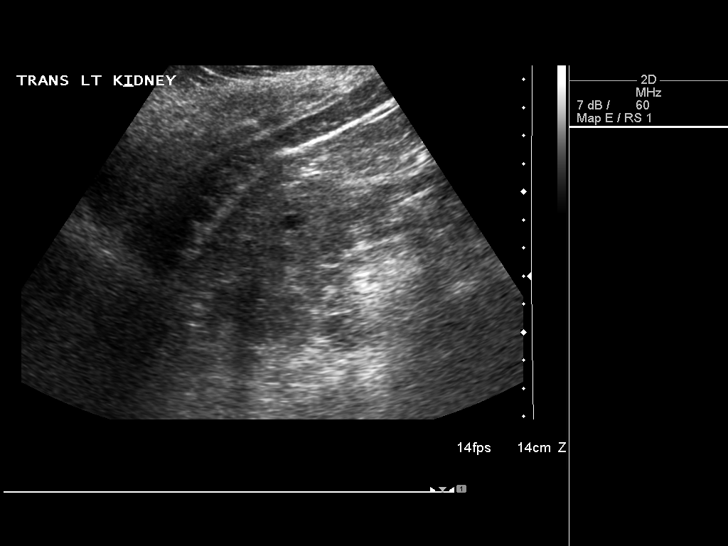

[13 of 25 positions shown; findings below may reference images not displayed]

FINDINGS: Gallbladder: No gallstones or wall thickening visualized. No
sonographic Murphy sign noted by sonographer.

Common bile duct: Diameter: 4.6 mm.

Liver: No focal lesion identified. Within normal limits in
parenchymal echogenicity.

IVC: Infrahepatic aspect not visualized secondary to bowel gas.

Pancreas: Evaluation of pancreatic tail limited by bowel gas.
Portions of the pancreas visualized unremarkable.

Spleen: Post splenectomy. 2.4 cm structure left upper quadrant may
represent post splenectomy splenule.

Right Kidney: Length: 10.5 cm. Echogenicity within normal limits. No
mass or hydronephrosis visualized.

Left Kidney: Length: 11.1 cm. No hydronephrosis. 7 mm lower pole
cyst..

Abdominal aorta: Evaluation limited by bowel gas. Atherosclerotic
changes. Maximal AP dimension 3 cm.

Other findings: None.
IMPRESSION: Post splenectomy. 2.4 cm structure left upper quadrant probably
splenule.

No hydronephrosis.  7 mm left lower pole cyst.

Evaluation of the aorta limited by bowel gas. Maximal AP dimension 3
cm.

Limited evaluation of pancreatic tail and infrahepatic aspect of
inferior vena cava secondary to bowel gas.

Remainder of exam unremarkable.

## 2016-01-02 ENCOUNTER — Other Ambulatory Visit: Payer: Self-pay | Admitting: Internal Medicine

## 2016-01-02 ENCOUNTER — Encounter: Payer: Self-pay | Admitting: Internal Medicine

## 2016-01-02 DIAGNOSIS — R140 Abdominal distension (gaseous): Secondary | ICD-10-CM

## 2016-01-06 ENCOUNTER — Encounter: Payer: Self-pay | Admitting: Internal Medicine

## 2016-01-11 ENCOUNTER — Encounter: Payer: Self-pay | Admitting: Internal Medicine

## 2016-01-15 ENCOUNTER — Ambulatory Visit (INDEPENDENT_AMBULATORY_CARE_PROVIDER_SITE_OTHER): Payer: Medicare Other | Admitting: Internal Medicine

## 2016-01-15 ENCOUNTER — Encounter: Payer: Self-pay | Admitting: Internal Medicine

## 2016-01-15 VITALS — BP 108/66 | HR 82 | Ht 61.5 in | Wt 197.0 lb

## 2016-01-15 DIAGNOSIS — R899 Unspecified abnormal finding in specimens from other organs, systems and tissues: Secondary | ICD-10-CM | POA: Diagnosis not present

## 2016-01-15 NOTE — Patient Instructions (Signed)
Please follow up as needed 

## 2016-01-15 NOTE — Progress Notes (Signed)
HISTORY OF PRESENT ILLNESS:  Mallory Klein is a 70 y.o. female with past medical history as listed below who has been followed previously in GI for chronic GERD and colonic polyp surveillance. She was last evaluated in the office January 2014. Impression at that time was GERD, asymptomatic esophageal stricture, and due for follow-up colonoscopy. See that dictation. Colonoscopy was performed August 2014. Examination was normal except for mild diverticulosis. Follow-up in 10 years recommended. Her last upper endoscopy was in 2012. She is sent today by her primary care provider Dr. Alain Marion with a chief complaint of elevated sedimentation rate. Reviewing outside records shows that the patient was in the emergency room the very end of July with 2 day history of nausea and vomiting. Evaluation was remarkable for equivocal abnormal urine. She was discharged and symptoms improved. Abdominal films that time were unremarkable. She subsequently underwent abdominal ultrasound because of abnormal urinalysis, elevated sedimentation rate, and low back pain. The ultrasound was negative post splenectomy. She is accompanied today by her husband. Patient continues on PPI for her reflux. Currently on Dexilant. Asymptomatic on medication. She denies dysphagia or recurrent problems with nausea or vomiting. There has been no abdominal pain. Bowel habits are unchanged. Occasional constipation. She has had no weight loss. Fairly saw a rheumatologist regarding her elevated segmentation rate. I do not have a copy of that workup for review. Her sedimentation rates have been greater than 100. As high as 130. Of interest, she has had interval anemia over the past year. Current hemoglobin about 10.6. Hemoglobin A1c 6.1. Patient denies headache or temporal soreness. Has had some visual complaints related to droopy eyelid. She has reported to Dr. Alain Marion fullness or mass near left neck which she states has improved somewhat recently in  terms of size. No pain with that complaint  REVIEW OF SYSTEMS:  All non-GI ROS negative except for mass near left neck, sinus and allergy, arthritis, back pain, visual change, cough, depression, urinary leakage  Past Medical History:  Diagnosis Date  . Anxiety   . Asthma   . Depression   . Diverticulosis of colon (without mention of hemorrhage)   . Esophageal stricture   . GERD (gastroesophageal reflux disease)   . Heart murmur   . Hypertension   . Internal hemorrhoids without mention of complication   . Osteoarthritis   . Shortness of breath dyspnea    with allergies    Past Surgical History:  Procedure Laterality Date  . ABDOMINAL HYSTERECTOMY    . BREAST CYST EXCISION Left 1980's  . LUMBAR LAMINECTOMY/DECOMPRESSION MICRODISCECTOMY Bilateral 02/20/2014   Procedure: LUMBAR TWO THREE, LUMBAR THREE FOUR LUMBAR LAMINECTOMY/DECOMPRESSION MICRODISCECTOMY 2 LEVELS;  Surgeon: Eustace Moore, MD;  Location: [facility];  Service: Neurosurgery;  Laterality: Bilateral;  . SPLENECTOMY    . TIBIA FRACTURE SURGERY Right   . TIBIA FRACTURE SURGERY Left   . TOTAL KNEE ARTHROPLASTY Right 2013    Social History [employer]  reports that she has never smoked. She has never used smokeless tobacco. She reports that she does not drink alcohol or use drugs.  family history includes Asthma in her mother; Diabetes in her mother; Diabetes type II in her mother; Hypertension in her mother; Mental illness in her father; Prostate cancer in her father.  Allergies  Allergen Reactions  . Ace Inhibitors     Patient doesn't recall reaction.  jkl  . Aspirin Other (See Comments)    bruising  . Citalopram Hydrobromide Diarrhea  PHYSICAL EXAMINATION: Vital signs: BP 108/66   Pulse 82   Ht 5' 1.5" (1.562 m)   Wt 197 lb (89.4 kg)   BMI 36.62 kg/m   Constitutional: Pleasant, obese, generally well-appearing, no acute distress Psychiatric: alert and oriented x3, cooperative Head: No  temporal soreness Eyes: extraocular movements intact, anicteric, conjunctiva pink Mouth: oral pharynx moist, no lesions Neck: supple no lymphadenopathy. There is mobile soft tissue fullness in the left supraclavicular region. Cardiovascular: heart regular rate and rhythm, no murmur Lungs: clear to auscultation bilaterally Abdomen: soft, obese, nontender, nondistended, no obvious ascites, no peritoneal signs, normal bowel sounds, no organomegaly Rectal: Omitted Extremities: no clubbing cyanosis or lower extremity edema bilaterally Skin: no lesions on visible extremities Neuro: No focal deficits. Cranial nerves intact.   ASSESSMENT:  #1. Elevated sedimentation rate of undetermined etiology. Given the patient's age and interval anemia, rule out temporal arteritis. Other possibilities include occult infection, inflammation, or neoplasia. #2. No obvious GI cause for the elevated sedimentation rate. Colonoscopy up to date and no abdominal complaints or alarm features from a GI standpoint. Normal liver tests and unremarkable ultrasound recently. #3. GERD. Asymptomatic on PPI #4. Screening colonoscopy up-to-date  #5. Supraclavicular fullness. On physical exam was likely a lipoma or cystic lesion. She are has plans to follow up with Dr. Alain Marion regarding evaluation of this issue. I will defer to him. #6. Interval anemia. Normocytic. See above  PLAN:  #1. I discussed with patient and her husband broad causes for elevated sedimentation rate. I also discussed the possibility of temporal arthritis. I contacted Dr. Alain Marion personally to discuss this and will leave further evaluation to his discretion. I also told patient and her husband that I did not think additional GI workup at this time is indicated, but CT scan of the abdomen and pelvis to screen for nonspecific malignancy could be considered. I discussed this with Dr. Alain Marion as well. She will return to the care of Dr. Alain Marion and contact his  office regarding follow-up of the elevated sedimentation rate, left supraclavicular fullness, and general care #2. Reflux precautions  #3. Continue PPI  #4. Colonoscopy 2024. Interval follow-up as needed   45 minutes were spent with the patient. Greater than 50% a time use for counseling regarding elevated sedimentation rate and plans we will forward

## 2016-03-03 ENCOUNTER — Ambulatory Visit: Payer: Medicare Other | Admitting: Internal Medicine

## 2016-03-17 ENCOUNTER — Ambulatory Visit (INDEPENDENT_AMBULATORY_CARE_PROVIDER_SITE_OTHER): Payer: Medicare Other | Admitting: Internal Medicine

## 2016-03-17 ENCOUNTER — Encounter: Payer: Self-pay | Admitting: Internal Medicine

## 2016-03-17 ENCOUNTER — Other Ambulatory Visit (INDEPENDENT_AMBULATORY_CARE_PROVIDER_SITE_OTHER): Payer: Medicare Other

## 2016-03-17 VITALS — BP 110/60 | HR 98 | Wt 196.0 lb

## 2016-03-17 DIAGNOSIS — J4541 Moderate persistent asthma with (acute) exacerbation: Secondary | ICD-10-CM | POA: Diagnosis not present

## 2016-03-17 DIAGNOSIS — G4733 Obstructive sleep apnea (adult) (pediatric): Secondary | ICD-10-CM

## 2016-03-17 DIAGNOSIS — R7989 Other specified abnormal findings of blood chemistry: Secondary | ICD-10-CM

## 2016-03-17 DIAGNOSIS — I10 Essential (primary) hypertension: Secondary | ICD-10-CM

## 2016-03-17 LAB — CBC WITH DIFFERENTIAL/PLATELET
Basophils Absolute: 0.2 10*3/uL — ABNORMAL HIGH (ref 0.0–0.1)
Basophils Relative: 1.4 % (ref 0.0–3.0)
EOS ABS: 0.4 10*3/uL (ref 0.0–0.7)
Eosinophils Relative: 3.8 % (ref 0.0–5.0)
HCT: 32.5 % — ABNORMAL LOW (ref 36.0–46.0)
Hemoglobin: 10.2 g/dL — ABNORMAL LOW (ref 12.0–15.0)
LYMPHS ABS: 3.1 10*3/uL (ref 0.7–4.0)
Lymphocytes Relative: 27.7 % (ref 12.0–46.0)
MCHC: 31.3 g/dL (ref 30.0–36.0)
MCV: 87.3 fl (ref 78.0–100.0)
MONO ABS: 1 10*3/uL (ref 0.1–1.0)
Monocytes Relative: 9.1 % (ref 3.0–12.0)
NEUTROS PCT: 58 % (ref 43.0–77.0)
Neutro Abs: 6.5 10*3/uL (ref 1.4–7.7)
Platelets: 636 10*3/uL — ABNORMAL HIGH (ref 150.0–400.0)
RBC: 3.73 Mil/uL — ABNORMAL LOW (ref 3.87–5.11)
RDW: 20.8 % — AB (ref 11.5–15.5)
WBC: 11.6 10*3/uL — AB (ref 4.0–10.5)

## 2016-03-17 LAB — BASIC METABOLIC PANEL
BUN: 13 mg/dL (ref 6–23)
CALCIUM: 9 mg/dL (ref 8.4–10.5)
CO2: 33 mEq/L — ABNORMAL HIGH (ref 19–32)
Chloride: 101 mEq/L (ref 96–112)
Creatinine, Ser: 1.28 mg/dL — ABNORMAL HIGH (ref 0.40–1.20)
GFR: 52.98 mL/min — ABNORMAL LOW (ref >60.00)
GLUCOSE: 91 mg/dL (ref 70–99)
POTASSIUM: 4.2 meq/L (ref 3.5–5.1)
SODIUM: 137 meq/L (ref 135–145)

## 2016-03-17 LAB — URINALYSIS, ROUTINE W REFLEX MICROSCOPIC
HGB URINE DIPSTICK: NEGATIVE
LEUKOCYTES UA: NEGATIVE
NITRITE: NEGATIVE
RBC / HPF: NONE SEEN (ref 0–?)
Specific Gravity, Urine: >=1.03 — AB (ref 1.000–1.030)
TOTAL PROTEIN, URINE-UPE24: 100 — AB
URINE GLUCOSE: NEGATIVE
UROBILINOGEN UA: 1 (ref 0.0–1.0)
pH: 5.5 (ref 5.0–8.0)

## 2016-03-17 LAB — HEPATIC FUNCTION PANEL
ALBUMIN: 3.8 g/dL (ref 3.5–5.2)
ALK PHOS: 75 U/L (ref 39–117)
ALT: 6 U/L (ref 0–35)
AST: 12 U/L (ref 0–37)
BILIRUBIN DIRECT: 0.1 mg/dL (ref 0.0–0.3)
Total Bilirubin: 0.3 mg/dL (ref 0.2–1.2)
Total Protein: 8 g/dL (ref 6.0–8.3)

## 2016-03-17 LAB — SEDIMENTATION RATE: Sed Rate: 124 mm/hr — ABNORMAL HIGH (ref 0–30)

## 2016-03-17 NOTE — Assessment & Plan Note (Signed)
Verapamil 

## 2016-03-17 NOTE — Progress Notes (Signed)
Subjective:  Patient ID: Mallory Klein, female    DOB: [birth year]  Age: 71 y.o. MRN: [id number]  CC: No chief complaint on file.   HPI Mallory Klein presents for arthritis, asthma, LBP, anemia, elev ESR f/u. C/o OSA - not usin a CPAP. C/o cough - worse  Outpatient Medications Prior to Visit  Medication Sig Dispense Refill  . Albuterol Sulfate (PROAIR RESPICLICK) 675 (90 BASE) MCG/ACT AEPB Inhale 1-2 puffs into the lungs 4 (four) times daily as needed. 1 each 11  . ALPRAZolam (XANAX) 0.5 MG tablet Take 1 tablet (0.5 mg total) by mouth 2 (two) times daily as needed. (Patient taking differently: Take 0.5 mg by mouth 2 (two) times daily as needed for anxiety. ) 180 tablet 0  . cetirizine (ZYRTEC) 10 MG tablet Take 10 mg by mouth daily.      . Cholecalciferol (VITAMIN D3) 1000 UNITS tablet Take 2 tablets by mouth once daily     . dexlansoprazole (DEXILANT) 60 MG capsule Take 1 capsule (60 mg total) by mouth daily. 90 capsule 3  . diclofenac sodium (VOLTAREN) 1 % GEL Apply 4 g topically 4 (four) times daily. As directed. (Patient taking differently: Apply 4 g topically 4 (four) times daily as needed (for pain resulting from spinal stenosis/arthritis.). ) 200 g 3  . Fluticasone-Salmeterol (ADVAIR DISKUS) 250-50 MCG/DOSE AEPB Inhale 1 puff into the lungs every 12 (twelve) hours. 60 each 11  . furosemide (LASIX) 20 MG tablet Take 1-2 tablets (20-40 mg total) by mouth daily as needed for edema. 60 tablet 3  . HYDROcodone-acetaminophen (NORCO) 10-325 MG tablet Take 1 tablet by mouth every 6 (six) hours as needed. (Patient taking differently: Take 1 tablet by mouth every 6 (six) hours as needed (for pain.). ) 120 tablet 0  . methocarbamol (ROBAXIN) 750 MG tablet Take 1 tablet (750 mg total) by mouth daily as needed for muscle spasms. 30 tablet 3  . montelukast (SINGULAIR) 10 MG tablet Take 1 tablet (10 mg total) by mouth daily. 90 tablet 3  . Multiple Vitamin (MULTIVITAMIN) tablet Take 1 tablet  by mouth daily.    . Omega-3 Fatty Acids (FISH OIL) 1000 MG CAPS Take 1 capsule by mouth daily.    . ondansetron (ZOFRAN) 4 MG tablet Take 1 tablet (4 mg total) by mouth every 6 (six) hours. 12 tablet 0  . promethazine (PHENERGAN) 12.5 MG suppository Place 1 suppository (12.5 mg total) rectally every 6 (six) hours as needed for nausea or vomiting. 12 suppository 0  . venlafaxine XR (EFFEXOR XR) 150 MG 24 hr capsule Take 1 capsule (150 mg total) by mouth daily with breakfast. 30 capsule 5  . verapamil (VERELAN PM) 180 MG 24 hr capsule Take 1 capsule (180 mg total) by mouth at bedtime. 90 capsule 3  . tolterodine (DETROL LA) 4 MG 24 hr capsule Take 1 capsule (4 mg total) by mouth daily. (Patient not taking: Reported on 03/17/2016) 90 capsule 3   Facility-Administered Medications Prior to Visit  Medication Dose Route Frequency Provider Last Rate Last Dose  . methylPREDNISolone acetate (DEPO-MEDROL) injection 40 mg  40 mg Intra-articular Once   V, MD        ROS Review of Systems  Constitutional: Positive for fatigue. Negative for activity change, appetite change, chills and unexpected weight change.  HENT: Positive for congestion. Negative for mouth sores and sinus pressure.   Eyes: Negative for visual disturbance.  Respiratory: Positive for cough, shortness of breath and wheezing. Negative  for chest tightness.   Gastrointestinal: Negative for abdominal pain and nausea.  Genitourinary: Negative for difficulty urinating, frequency and vaginal pain.  Musculoskeletal: Positive for back pain and gait problem.  Skin: Negative for pallor and rash.  Neurological: Negative for dizziness, tremors, weakness, numbness and headaches.  Psychiatric/Behavioral: Positive for decreased concentration and sleep disturbance. Negative for confusion and suicidal ideas. The patient is not nervous/anxious.     Objective:  BP 110/60   Pulse 98   Wt 196 lb (88.9 kg)   SpO2 96%   BMI 36.43 kg/m    BP Readings from Last 3 Encounters:  03/17/16 110/60  01/15/16 108/66  12/03/15 122/80    Wt Readings from Last 3 Encounters:  03/17/16 196 lb (88.9 kg)  01/15/16 197 lb (89.4 kg)  12/03/15 202 lb (91.6 kg)    Physical Exam  Constitutional: She appears well-developed. No distress.  HENT:  Head: Normocephalic.  Right Ear: External ear normal.  Left Ear: External ear normal.  Nose: Nose normal.  Mouth/Throat: Oropharynx is clear and moist.  Eyes: Conjunctivae are normal. Pupils are equal, round, and reactive to light. Right eye exhibits no discharge. Left eye exhibits no discharge.  Neck: Normal range of motion. Neck supple. No JVD present. No tracheal deviation present. No thyromegaly present.  Cardiovascular: Normal rate, regular rhythm and normal heart sounds.   Pulmonary/Chest: No stridor. No respiratory distress. She has no wheezes.  Abdominal: Soft. Bowel sounds are normal. She exhibits no distension and no mass. There is no tenderness. There is no rebound and no guarding.  Musculoskeletal: She exhibits tenderness. She exhibits no edema.  Lymphadenopathy:    She has no cervical adenopathy.  Neurological: She displays normal reflexes. No cranial nerve deficit. She exhibits normal muscle tone. Coordination abnormal.  Skin: No rash noted. No erythema.  Psychiatric: She has a normal mood and affect. Her behavior is normal. Judgment and thought content normal.  obese  LS tender walker  Lab Results  Component Value Date   WBC 13.4 (H) 12/03/2015   HGB 10.6 (L) 12/03/2015   HCT 34.3 (L) 12/03/2015   PLT 620.0 (H) 12/03/2015   GLUCOSE 88 12/03/2015   CHOL 219 (H) 02/05/2015   TRIG 69.0 02/05/2015   HDL 73.70 02/05/2015   LDLDIRECT 109.8 02/10/2010   LDLCALC 132 (H) 02/05/2015   ALT 13 (L) 09/21/2015   AST 20 09/21/2015   NA 140 12/03/2015   K 4.0 12/03/2015   CL 101 12/03/2015   CREATININE 1.07 12/03/2015   BUN 12 12/03/2015   CO2 34 (H) 12/03/2015   TSH 0.84  09/03/2015   INR 0.97 02/07/2014   HGBA1C 6.1 12/03/2015    US Abdomen Complete  Result Date: 12/31/2015 CLINICAL DATA:  71 year old female with abnormal urinalysis. Initial encounter. EXAM: ABDOMEN ULTRASOUND COMPLETE COMPARISON:  None. FINDINGS: Gallbladder: No gallstones or wall thickening visualized. No sonographic Murphy sign noted by sonographer. Common bile duct: Diameter: 4.6 mm. Liver: No focal lesion identified. Within normal limits in parenchymal echogenicity. IVC: Infrahepatic aspect not visualized secondary to bowel gas. Pancreas: Evaluation of pancreatic tail limited by bowel gas. Portions of the pancreas visualized unremarkable. Spleen: Post splenectomy. 2.4 cm structure left upper quadrant may represent post splenectomy splenule. Right Kidney: Length: 10.5 cm. Echogenicity within normal limits. No mass or hydronephrosis visualized. Left Kidney: Length: 11.1 cm. No hydronephrosis. 7 mm lower pole cyst. Abdominal aorta: Evaluation limited by bowel gas. Atherosclerotic changes. Maximal AP dimension 3 cm. Other findings: None. IMPRESSION: Post splenectomy.  2.4 cm structure left upper quadrant probably splenule. No hydronephrosis.  7 mm left lower pole cyst. Evaluation of the aorta limited by bowel gas. Maximal AP dimension 3 cm. Limited evaluation of pancreatic tail and infrahepatic aspect of inferior vena cava secondary to bowel gas. Remainder of exam unremarkable. Electronically Signed   By: Genia Del M.D.   On: 12/31/2015 13:10    Assessment & Plan:   There are no diagnoses linked to this encounter. I am having Ms. Ritsema maintain her cholecalciferol, cetirizine, multivitamin, diclofenac sodium, Fish Oil, ALPRAZolam, furosemide, HYDROcodone-acetaminophen, methocarbamol, Albuterol Sulfate, dexlansoprazole, Fluticasone-Salmeterol, montelukast, verapamil, ondansetron, promethazine, tolterodine, venlafaxine XR, and oxybutynin. We will continue to administer methylPREDNISolone  acetate.  Meds ordered this encounter  Medications  . oxybutynin (DITROPAN) 5 MG tablet    Sig: TAKE 1 TABLET BY MOUTH EVERY 8 HOURS AS NEEDED FOR BLADDER SPASMS     Follow-up: No Follow-up on file.  Walker Kehr, MD

## 2016-03-17 NOTE — Assessment & Plan Note (Signed)
Wt Readings from Last 3 Encounters:  03/17/16 196 lb (88.9 kg)  01/15/16 197 lb (89.4 kg)  12/03/15 202 lb (91.6 kg)

## 2016-03-17 NOTE — Assessment & Plan Note (Signed)
Repeat labs:

## 2016-03-17 NOTE — Assessment & Plan Note (Addendum)
Cough is worse Pulm ref - f/u w/Dr DeDios - r/o CTD of the lungs

## 2016-03-17 NOTE — Assessment & Plan Note (Signed)
Pulm ref - needs to re-start

## 2016-03-17 NOTE — Progress Notes (Signed)
Pre visit review using our clinic review tool, if applicable. No additional management support is needed unless otherwise documented below in the visit note. 

## 2016-04-02 ENCOUNTER — Telehealth: Payer: Self-pay | Admitting: Internal Medicine

## 2016-04-02 ENCOUNTER — Telehealth: Payer: Self-pay

## 2016-04-02 MED ORDER — DEXLANSOPRAZOLE 60 MG PO CPDR: 60.0000 mg | DELAYED_RELEASE_CAPSULE | Freq: Every day | ORAL | 3 refills | Status: DC

## 2016-04-02 NOTE — Telephone Encounter (Signed)
Mailed completed [facility] application for Mallory Klein

## 2016-04-02 NOTE — Telephone Encounter (Signed)
Mailed requested forms

## 2016-04-06 ENCOUNTER — Telehealth: Payer: Self-pay | Admitting: Internal Medicine

## 2016-04-06 NOTE — Telephone Encounter (Signed)
A user error has taken place.

## 2016-05-12 ENCOUNTER — Other Ambulatory Visit (INDEPENDENT_AMBULATORY_CARE_PROVIDER_SITE_OTHER): Payer: Medicare Other

## 2016-05-12 ENCOUNTER — Ambulatory Visit (INDEPENDENT_AMBULATORY_CARE_PROVIDER_SITE_OTHER)
Admission: RE | Admit: 2016-05-12 | Discharge: 2016-05-12 | Disposition: A | Discharge status: Home / Self Care | Payer: Medicare Other | Source: Ambulatory Visit | Attending: Pulmonary Disease | Admitting: Pulmonary Disease

## 2016-05-12 ENCOUNTER — Ambulatory Visit (INDEPENDENT_AMBULATORY_CARE_PROVIDER_SITE_OTHER): Payer: Medicare Other | Admitting: Pulmonary Disease

## 2016-05-12 VITALS — BP 124/74 | HR 103

## 2016-05-12 DIAGNOSIS — R05 Cough: Secondary | ICD-10-CM | POA: Diagnosis not present

## 2016-05-12 DIAGNOSIS — J45901 Unspecified asthma with (acute) exacerbation: Secondary | ICD-10-CM

## 2016-05-12 DIAGNOSIS — G4710 Hypersomnia, unspecified: Secondary | ICD-10-CM

## 2016-05-12 DIAGNOSIS — J4540 Moderate persistent asthma, uncomplicated: Secondary | ICD-10-CM

## 2016-05-12 DIAGNOSIS — G4733 Obstructive sleep apnea (adult) (pediatric): Secondary | ICD-10-CM | POA: Diagnosis not present

## 2016-05-12 DIAGNOSIS — R059 Cough, unspecified: Secondary | ICD-10-CM | POA: Insufficient documentation

## 2016-05-12 LAB — CBC WITH DIFFERENTIAL/PLATELET
Basophils Absolute: 0.1 10*3/uL (ref 0.0–0.1)
Basophils Relative: 1 % (ref 0.0–3.0)
EOS ABS: 0.4 10*3/uL (ref 0.0–0.7)
Eosinophils Relative: 3.3 % (ref 0.0–5.0)
HCT: 34.2 % — ABNORMAL LOW (ref 36.0–46.0)
HEMOGLOBIN: 10.6 g/dL — AB (ref 12.0–15.0)
LYMPHS ABS: 4.1 10*3/uL — AB (ref 0.7–4.0)
Lymphocytes Relative: 33.1 % (ref 12.0–46.0)
MCHC: 30.9 g/dL (ref 30.0–36.0)
MCV: 88.4 fl (ref 78.0–100.0)
MONO ABS: 1 10*3/uL (ref 0.1–1.0)
Monocytes Relative: 8.1 % (ref 3.0–12.0)
NEUTROS PCT: 54.5 % (ref 43.0–77.0)
Neutro Abs: 6.7 10*3/uL (ref 1.4–7.7)
Platelets: 620 10*3/uL — ABNORMAL HIGH (ref 150.0–400.0)
RBC: 3.87 Mil/uL (ref 3.87–5.11)
RDW: 19.4 % — AB (ref 11.5–15.5)
WBC: 12.3 10*3/uL — AB (ref 4.0–10.5)

## 2016-05-12 IMAGING — DX DG CHEST 2V
2 series · 2 of 2 positions shown · non-contrast
Comparison: [DATE]

CLINICAL DATA: Intermittent shortness of Breath.

EXAM:
CHEST  2 VIEW

[chest pa]
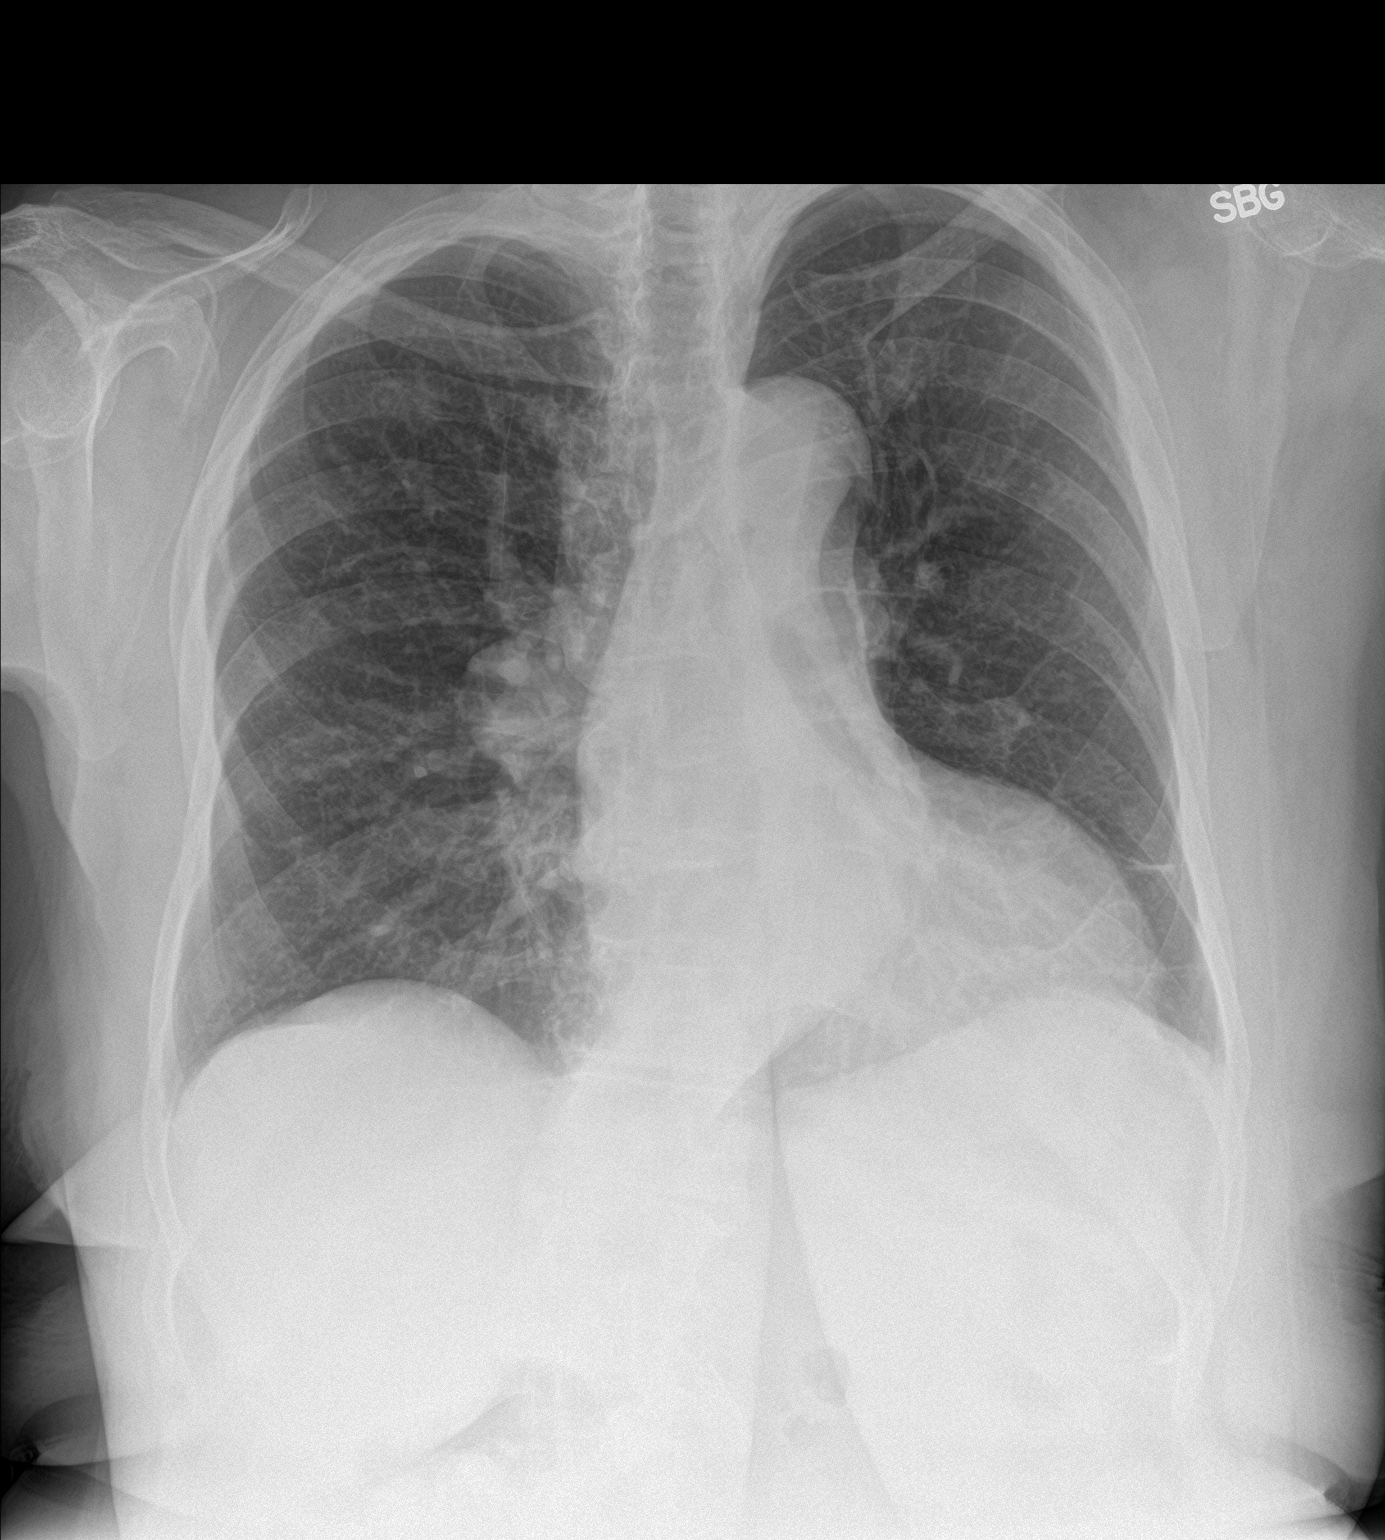

[chest lat]
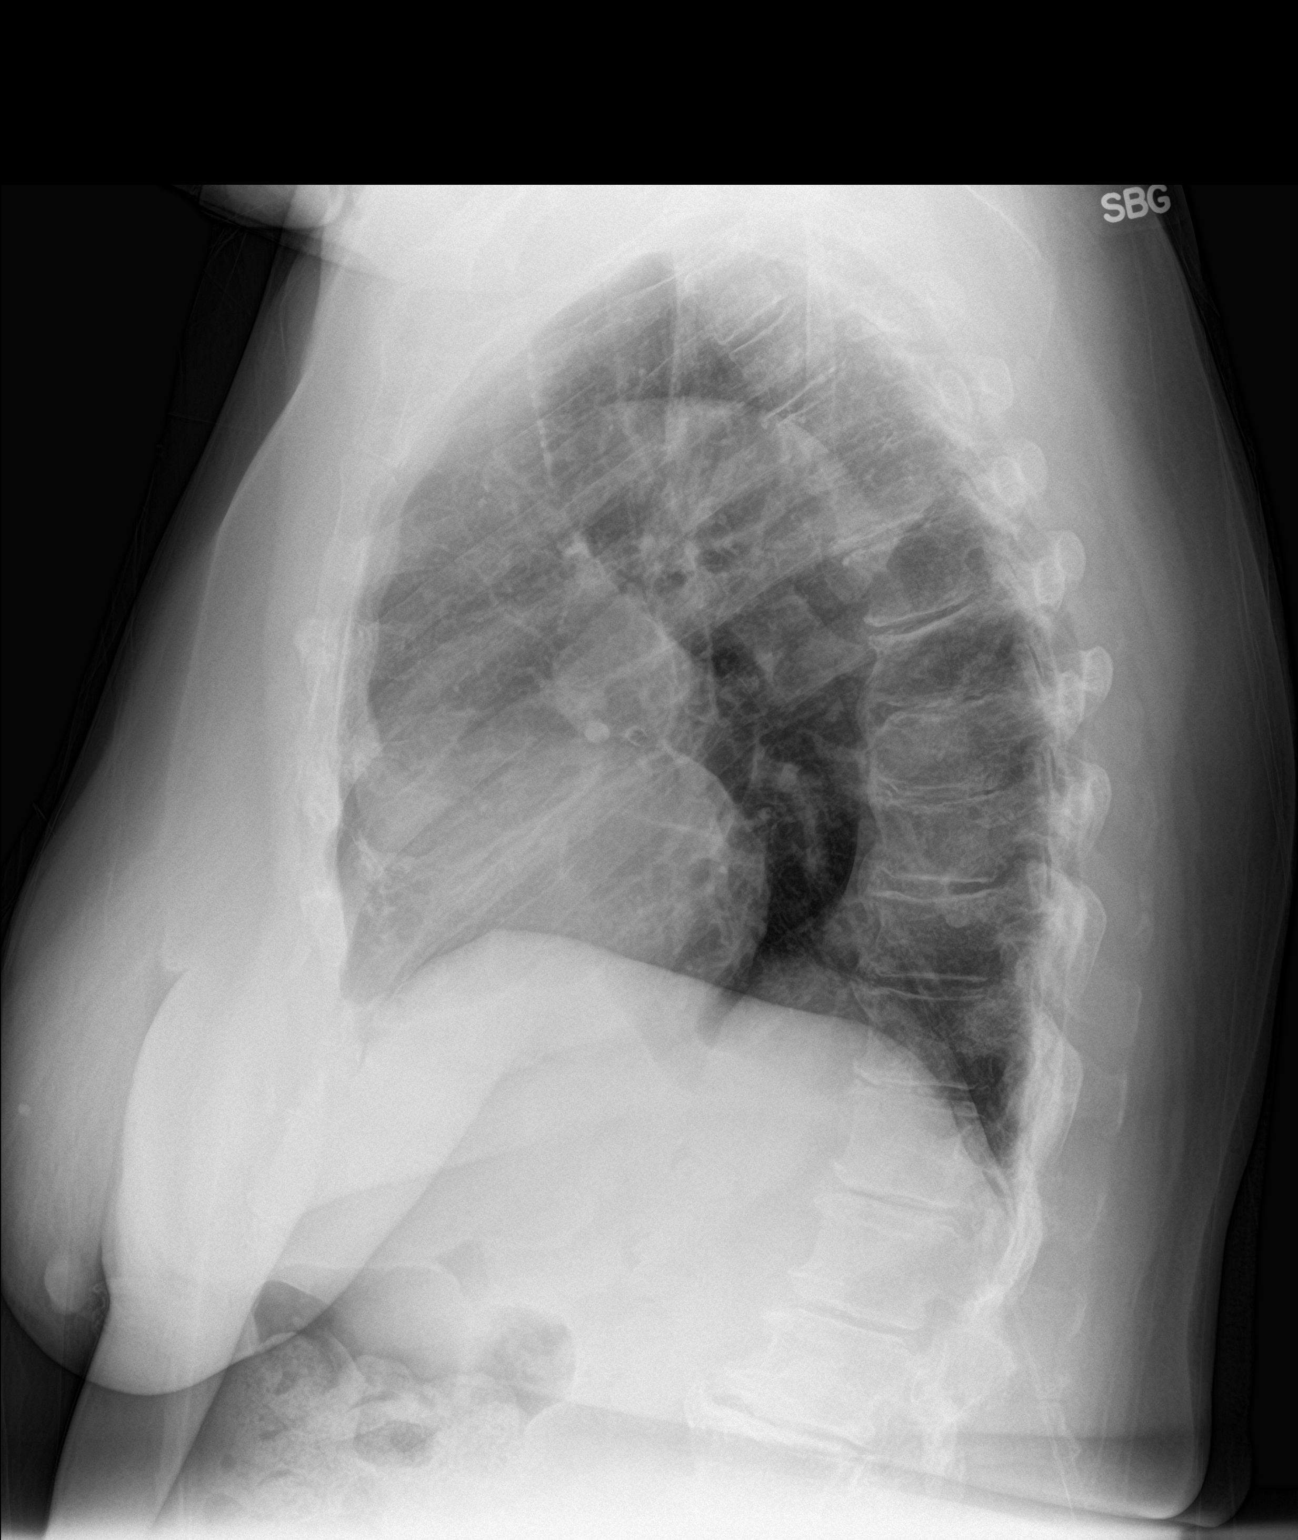

[2 of 2 positions shown; findings below may reference images not displayed]

FINDINGS: Heart is borderline in size. Lingular scarring, stable. Right lung
is clear. No effusions or acute bony abnormality.
IMPRESSION: Borderline cardiomegaly.  Lingular scarring.  No active disease.

## 2016-05-12 MED ORDER — FLUTICASONE FUROATE-VILANTEROL 100-25 MCG/INH IN AEPB
1.0000 | INHALATION_SPRAY | Freq: Every day | RESPIRATORY_TRACT | 0 refills | Status: DC
Start: 2016-05-12 — End: 2016-05-14

## 2016-05-12 NOTE — Assessment & Plan Note (Addendum)
Pt  was diagnosed with asthma in her 65s.  Triggers: strong odors, change in weather, pollen, dander. Her asthma has over all been stable.   She has cough for several months now.  She goes into coughing spells.  Uses advair as needed when her "asthma" flares up. She uses zyrtec and singulair daily.   Denies UACS. Denies a lot of congestion.  Denies coughs at night. GERD has been stable.     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  She has been coughing every now and then depending on her asthma triggers.   Plan : 1. She currently uses Advair, one puff twice a day. She gets samples from her primary care doctor. We will try her on Breo daily instead of advair. We will give her a sample. If she feels better on Breo, we will switch her to that from [facility]. 2. Continue albuterol every 4 hours as needed. 3. Continue Zyrtec and Singulair daily. 4. Needs PFT, chest x-ray, CBC, IgE, RAST.

## 2016-05-12 NOTE — Progress Notes (Signed)
Subjective:    Patient ID: Mallory Klein, female    DOB: Nov 21, 1945, 71 y.o.   MRN: [id number]  HPI   This is the case of Mallory Klein, 71 y.o. Female, who was referred by Dr. Walker Kehr  in consultation regarding cough/asthma  and OSA.   As you very well know, patient is a non smoker. She was diagnosed with asthma in her 68s.  Triggers: strong odors, change in weather, pollen, dander. Her asthma has over all been stable.   She has cough for several months now.  She goes into coughing spells.  Uses advair as needed when her "asthma" flares up. She uses zyrtec and singulair daily.   Denies UACS. Denies a lot of congestion.  Denies coughs at night. GERD has been stable.     She was recently diagnosed with "floppy eyelids" and swollen eyes which is probably related to untreated OSA per eye MD.   Patient was diagnosed with OSA 20 yrs ago. Had snoring, gasping, choking, hypersomnia. Used cpap for years. Occasional sleep talking. She fell through the cracks and has stopped using CPAP machine for many years now. Very symptomatic now. S he uses Norco usually 1/xmonth.  Uses Robaxin 1-2 x/month.   Review of Systems  Constitutional: Negative for fever and unexpected weight change.  HENT: Negative for congestion, dental problem, ear pain, nosebleeds, postnasal drip, rhinorrhea, sinus pressure, sneezing, sore throat and trouble swallowing.   Eyes: Negative for redness and itching.  Respiratory: Positive for cough and shortness of breath. Negative for chest tightness and wheezing.   Cardiovascular: Negative for palpitations and leg swelling.  Gastrointestinal: Negative for nausea and vomiting.  Genitourinary: Negative for dysuria.  Musculoskeletal: Negative for joint swelling.  Skin: Negative for rash.  Neurological: Negative for headaches.  Hematological: Does not bruise/bleed easily.  Psychiatric/Behavioral: Negative for dysphoric mood. The patient is not nervous/anxious.     Past Medical History:  Diagnosis Date  . Anxiety   . Asthma   . Depression   . Diverticulosis of colon (without mention of hemorrhage)   . Esophageal stricture   . GERD (gastroesophageal reflux disease)   . Heart murmur   . Hypertension   . Internal hemorrhoids without mention of complication   . Osteoarthritis   . Shortness of breath dyspnea    with allergies  (-) CA, DVT.    Family History  Problem Relation Age of Onset  . Hypertension Mother   . Diabetes type II Mother   . Asthma Mother   . Diabetes Mother   . Prostate cancer Father   . Mental illness Father     dementia  . Colon cancer Neg Hx      Past Surgical History:  Procedure Laterality Date  . ABDOMINAL HYSTERECTOMY    . BREAST CYST EXCISION Left 1980's  . LUMBAR LAMINECTOMY/DECOMPRESSION MICRODISCECTOMY Bilateral 02/20/2014   Procedure: LUMBAR TWO THREE, LUMBAR THREE FOUR LUMBAR LAMINECTOMY/DECOMPRESSION MICRODISCECTOMY 2 LEVELS;  Surgeon: Eustace Moore, MD;  Location: [facility];  Service: Neurosurgery;  Laterality: Bilateral;  . SPLENECTOMY    . TIBIA FRACTURE SURGERY Right   . TIBIA FRACTURE SURGERY Left   . TOTAL KNEE ARTHROPLASTY Right 2013    Social History   Social History  . Marital status: Married    Spouse name: N/A  . Number of children: N/A  . Years of education: N/A   Occupational History  . Retired    Social History Main Topics  . Smoking status: Never  Smoker  . Smokeless tobacco: Never Used  . Alcohol use No     Comment: wine on occ   . Drug use: No  . Sexual activity: Not Currently   Other Topics Concern  . Not on file   Social History Narrative   Drink tea daily and coffee on occ   Married, worked for the state, was an Estate agent. Lives in [place].   Allergies  Allergen Reactions  . Ace Inhibitors     Patient doesn't recall reaction.  jkl  . Aspirin Other (See Comments)    bruising  . Citalopram Hydrobromide Diarrhea     Outpatient Medications Prior to Visit  Medication  Sig Dispense Refill  . Albuterol Sulfate (PROAIR RESPICLICK) 027 (90 BASE) MCG/ACT AEPB Inhale 1-2 puffs into the lungs 4 (four) times daily as needed. 1 each 11  . ALPRAZolam (XANAX) 0.5 MG tablet Take 1 tablet (0.5 mg total) by mouth 2 (two) times daily as needed. (Patient taking differently: Take 0.5 mg by mouth 2 (two) times daily as needed for anxiety. ) 180 tablet 0  . cetirizine (ZYRTEC) 10 MG tablet Take 10 mg by mouth daily.      . Cholecalciferol (VITAMIN D3) 1000 UNITS tablet Take 2 tablets by mouth once daily     . dexlansoprazole (DEXILANT) 60 MG capsule Take 1 capsule (60 mg total) by mouth daily. 90 capsule 3  . diclofenac sodium (VOLTAREN) 1 % GEL Apply 4 g topically 4 (four) times daily. As directed. (Patient taking differently: Apply 4 g topically 4 (four) times daily as needed (for pain resulting from spinal stenosis/arthritis.). ) 200 g 3  . Fluticasone-Salmeterol (ADVAIR DISKUS) 250-50 MCG/DOSE AEPB Inhale 1 puff into the lungs every 12 (twelve) hours. 60 each 11  . furosemide (LASIX) 20 MG tablet Take 1-2 tablets (20-40 mg total) by mouth daily as needed for edema. 60 tablet 3  . HYDROcodone-acetaminophen (NORCO) 10-325 MG tablet Take 1 tablet by mouth every 6 (six) hours as needed. (Patient taking differently: Take 1 tablet by mouth every 6 (six) hours as needed (for pain.). ) 120 tablet 0  . methocarbamol (ROBAXIN) 750 MG tablet Take 1 tablet (750 mg total) by mouth daily as needed for muscle spasms. 30 tablet 3  . montelukast (SINGULAIR) 10 MG tablet Take 1 tablet (10 mg total) by mouth daily. 90 tablet 3  . Multiple Vitamin (MULTIVITAMIN) tablet Take 1 tablet by mouth daily.    . Omega-3 Fatty Acids (FISH OIL) 1000 MG CAPS Take 1 capsule by mouth daily.    . ondansetron (ZOFRAN) 4 MG tablet Take 1 tablet (4 mg total) by mouth every 6 (six) hours. 12 tablet 0  . oxybutynin (DITROPAN) 5 MG tablet TAKE 1 TABLET BY MOUTH EVERY 8 HOURS AS NEEDED FOR BLADDER SPASMS    .  promethazine (PHENERGAN) 12.5 MG suppository Place 1 suppository (12.5 mg total) rectally every 6 (six) hours as needed for nausea or vomiting. 12 suppository 0  . tolterodine (DETROL LA) 4 MG 24 hr capsule Take 1 capsule (4 mg total) by mouth daily. 90 capsule 3  . venlafaxine XR (EFFEXOR XR) 150 MG 24 hr capsule Take 1 capsule (150 mg total) by mouth daily with breakfast. 30 capsule 5  . verapamil (VERELAN PM) 180 MG 24 hr capsule Take 1 capsule (180 mg total) by mouth at bedtime. 90 capsule 3   Facility-Administered Medications Prior to Visit  Medication Dose Route Frequency Provider Last Rate Last Dose  . methylPREDNISolone  acetate (DEPO-MEDROL) injection 40 mg  40 mg Intra-articular Once Cassandria Anger, MD       Meds ordered this encounter  Medications  . fluticasone furoate-vilanterol (BREO ELLIPTA) 100-25 MCG/INH AEPB    Sig: Inhale 1 puff into the lungs daily.    Dispense:  14 each    Refill:  0    Order Specific Question:   Lot Number?    Answer:   [id number]    Order Specific Question:   Expiration Date?    Answer:   09/23/2017    Order Specific Question:   Quantity    Answer:   1         Objective:   Physical Exam   Vitals:  Vitals:   05/12/16 1535  BP: 124/74  Pulse: (!) 103  SpO2: 96%    Constitutional/General:  Pleasant, well-nourished, well-developed, not in any distress,  Comfortably seating.  Well kempt  There is no height or weight on file to calculate BMI. Wt Readings from Last 3 Encounters:  03/17/16 196 lb (88.9 kg)  01/15/16 197 lb (89.4 kg)  12/03/15 202 lb (91.6 kg)    Neck circumference:   HEENT: Pupils equal and reactive to light and accommodation. Anicteric sclerae. Normal nasal mucosa.   No oral  lesions,  mouth clear,  oropharynx clear, no postnasal drip. (-) Oral thrush. No dental caries.  Airway - Mallampati class III  Neck: No masses. Midline trachea. No JVD, (-) LAD. (-) bruits appreciated.  Respiratory/Chest: Grossly normal  chest. (-) deformity. (-) Accessory muscle use.  Symmetric expansion. (-) Tenderness on palpation.  Resonant on percussion.  Diminished BS on both lower lung zones. (-) wheezing, crackles, rhonchi (-) egophony  Cardiovascular: Regular rate and  rhythm, heart sounds normal, no murmur or gallops, no peripheral edema  Gastrointestinal:  Normal bowel sounds. Soft, non-tender. No hepatosplenomegaly.  (-) masses.   Musculoskeletal:  Normal muscle tone. Normal gait.   Extremities: Grossly normal. (-) clubbing, cyanosis.  (-) edema  Skin: (-) rash,lesions seen.   Neurological/Psychiatric : alert, oriented to time, place, person. Normal mood and affect         Assessment & Plan:  Asthma Pt  was diagnosed with asthma in her 72s.  Triggers: strong odors, change in weather, pollen, dander. Her asthma has over all been stable.   She has cough for several months now.  She goes into coughing spells.  Uses advair as needed when her "asthma" flares up. She uses zyrtec and singulair daily.   Denies UACS. Denies a lot of congestion.  Denies coughs at night. GERD has been stable.     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  She has been coughing every now and then depending on her asthma triggers.   Plan : 1. She currently uses Advair, one puff twice a day. She gets samples from her primary care doctor. We will try her on Breo daily instead of advair. We will give her a sample. If she feels better on Breo, we will switch her to that from [place]. 2. Continue albuterol every 4 hours as needed. 3. Continue Zyrtec and Singulair daily. 4. Needs PFT, chest x-ray, CBC, IgE, RAST.    OSA (obstructive sleep apnea) She was recently diagnosed with "floppy eyelids" and swollen eyes which is probably related to untreated OSA per eye MD.   Patient was diagnosed with OSA 20 yrs ago. Had snoring, gasping, choking, hypersomnia. Used cpap for years. Occasional sleep talking. She fell  through the cracks and has stopped using CPAP machine for many years now. Very symptomatic now. She uses Norco usually 1/xmonth.  Uses Robaxin 1-2 x/month.   Plan :   We discussed about the diagnosis of Obstructive Sleep Apnea (OSA) and implications of untreated OSA. We discussed about CPAP and BiPaP as possible treatment options.    We will schedule the patient for a sleep study. Plan for a lab study. Didn't dissipate no issues with CPAP. She uses it for many years several years ago but fell through the cracks.   Patient was instructed to call the office if he/she has not heard back from the office 1-2 weeks after the sleep study.   Patient was instructed to call the office if he/she is having issues with the PAP device.   We discussed good sleep hygiene.   Patient was advised not to engage in activities requiring concentration and/or vigilance if he/she is sleepy.  Patient was advised not to drive if he/she is sleepy.     Cough Her cough is likely a manifestation of her asthma. Start her on Breo, 1 puff daily. Continue Singulair and Zyrtec. Continue PPI for reflux disease as well. Denies upper airway cough syndrome. She is currently not on any nasal sprays.     Thank you very much for letting me participate in this patient's care. Please do not hesitate to give me a call if you have any questions or concerns regarding the treatment plan.   Patient will follow up with me in 6-8 weeks.     Monica Becton, MD 05/12/2016   5:15 PM [facility] [place] Pager: [phone] Office: [phone], Fax: [phone]

## 2016-05-12 NOTE — Assessment & Plan Note (Signed)
Her cough is likely a manifestation of her asthma. Start her on Breo, 1 puff daily. Continue Singulair and Zyrtec. Continue PPI for reflux disease as well. Denies upper airway cough syndrome. She is currently not on any nasal sprays.

## 2016-05-12 NOTE — Assessment & Plan Note (Addendum)
She was recently diagnosed with "floppy eyelids" and swollen eyes which is probably related to untreated OSA per eye MD.   Patient was diagnosed with OSA 20 yrs ago. Had snoring, gasping, choking, hypersomnia. Used cpap for years. Occasional sleep talking. She fell through the cracks and has stopped using CPAP machine for many years now. Very symptomatic now. She uses Norco usually 1/xmonth.  Uses Robaxin 1-2 x/month.   Plan :   We discussed about the diagnosis of Obstructive Sleep Apnea (OSA) and implications of untreated OSA. We discussed about CPAP and BiPaP as possible treatment options.    We will schedule the patient for a sleep study. Plan for a lab study. Didn't dissipate no issues with CPAP. She uses it for many years several years ago but fell through the cracks.   Patient was instructed to call the office if he/she has not heard back from the office 1-2 weeks after the sleep study.   Patient was instructed to call the office if he/she is having issues with the PAP device.   We discussed good sleep hygiene.   Patient was advised not to engage in activities requiring concentration and/or vigilance if he/she is sleepy.  Patient was advised not to drive if he/she is sleepy.

## 2016-05-12 NOTE — Patient Instructions (Signed)
It was a pleasure taking care of you today!  We will schedule you to have a sleep study to determine if you have sleep apnea.   We will get a lab sleep study.  You will be scheduled to have a lab sleep study in 4-6 weeks.  Someone from the sleep lab will call you in 2-3 days to schedule the study with you.  They usually have cancellations every night so most likely, they will have openings for a lab sleep study next week or so.  We encourage you to do your sleep study then if possible. Please give [place] a call in a week is no one from the sleep lab calls you in 2-3 days.   If the sleep study is positive, we will order you a CPAP  machine.  Please call the office if you do NOT receive your machine in the next 1-2 weeks.   Please make sure you use your CPAP device everytime you sleep.  We will monitor the usage of your machine per your insurance requirement.  Your insurance company may take the machine from you if you are not using it regularly.   Please clean the mask, tubings, filter, water reservoir with soapy water every week.  Please use distilled water for the water reservoir.   Please call the office or your machine provider (DME company) if you are having issues with the device.   We will order a breathing test, chest x-ray, allergy test. We will try Breo 1 puff daily instead of advair.   Return to clinic in 6-8 weeks with Dr. Corrie Dandy or NP

## 2016-05-13 ENCOUNTER — Telehealth: Payer: Self-pay | Admitting: Pulmonary Disease

## 2016-05-13 LAB — RESPIRATORY ALLERGY PROFILE REGION II ~~LOC~~
Allergen, C. Herbarum, M2: <0.1 kU/L
Allergen, Cedar tree, t12: <0.1 kU/L
Allergen, Comm Silver Birch, t9: <0.1 kU/L
Allergen, Cottonwood, t14: <0.1 kU/L
Allergen, Mouse Urine Protein, e78: <0.1 kU/L
Allergen, Oak,t7: <0.1 kU/L
Allergen, P. notatum, m1: <0.1 kU/L
Aspergillus fumigatus, m3: <0.1 kU/L
Bermuda Grass: <0.1 kU/L
Common Ragweed: <0.1 kU/L
ELM IGE: 0.12 kU/L — AB
IgE (Immunoglobulin E), Serum: 98 kU/L (ref <115)
Rough Pigweed  IgE: <0.1 kU/L

## 2016-05-13 NOTE — Telephone Encounter (Signed)
   Hello. pls tell the pt that Breo is 1 puff daily.  There was probably some miscommunication.   We can give her more samples if needed and if we have. Thanks.  Monica Becton, MD 05/13/2016, 11:24 PM [facility] Pager [phone] After 3 pm or if no answer, call [phone]

## 2016-05-13 NOTE — Telephone Encounter (Signed)
Spoke with pt, started breo today- pt states that the sample given will only last 7 days and is concerned that she will not be able to afford the medication.  I advised that [place] is 1 puff once daily and a sample should last 2 weeks.  Pt states she was told by AD to take Breo 1 puff bid.  I reviewed AVS with pt which states specifically 1 puf qd, but pt began to become upset stating that she knows she was told to take BID.  Pt requesting clarification from AD.    AD please advise on breo dosage.  Thanks.

## 2016-05-14 MED ORDER — FLUTICASONE FUROATE-VILANTEROL 100-25 MCG/INH IN AEPB: 1.0000 | INHALATION_SPRAY | Freq: Every day | RESPIRATORY_TRACT | 0 refills | Status: DC

## 2016-05-14 NOTE — Telephone Encounter (Signed)
Spoke with pt and informed her of AD's message. Pt understood and requested another sample. This has been placed up front for her. Nothing further is needed at this time.

## 2016-05-14 NOTE — Telephone Encounter (Signed)
lmtcb X1 for pt to relay recs.   

## 2016-06-16 ENCOUNTER — Ambulatory Visit: Payer: Medicare Other | Admitting: Internal Medicine

## 2016-06-26 ENCOUNTER — Ambulatory Visit: Payer: Medicare Other | Admitting: Internal Medicine

## 2016-06-30 ENCOUNTER — Ambulatory Visit (HOSPITAL_BASED_OUTPATIENT_CLINIC_OR_DEPARTMENT_OTHER): Payer: Medicare Other | Attending: Pulmonary Disease | Admitting: Pulmonary Disease

## 2016-06-30 DIAGNOSIS — G4733 Obstructive sleep apnea (adult) (pediatric): Secondary | ICD-10-CM | POA: Insufficient documentation

## 2016-06-30 DIAGNOSIS — G4710 Hypersomnia, unspecified: Secondary | ICD-10-CM | POA: Diagnosis present

## 2016-07-02 ENCOUNTER — Telehealth: Payer: Self-pay | Admitting: Pulmonary Disease

## 2016-07-02 DIAGNOSIS — G4710 Hypersomnia, unspecified: Secondary | ICD-10-CM

## 2016-07-02 NOTE — Telephone Encounter (Signed)
    Please call the pt and tell the pt the LAB SLEEP STUDY  showed OSA.    Pt stops breathing  25  times an hour. She however only slept for 45 minutes.  She was frequently awake and not sure if she was uncomfortable with the leads, etc.   We can try and see  if autocpap will help her symptoms.  Please order autoCPAP 5-15 cm H2O. Patient will need a mask fitting session. Patient will need a 1 month download.   Patient needs to be seen by me or any of the NPs/APPs  4-6 weeks after obtaining the cpap machine.   If she did NOT want cpap, we can see her sooner and discuss results.  I tried calling but no answer.  Let me know if you receive this.   Thanks!   J. Shirl Harris, MD 07/02/2016, 4:51 PM

## 2016-07-02 NOTE — Telephone Encounter (Signed)
Contacted pt got an unidentified female who states pt was not currently home. Will try to call pt tomorrow

## 2016-07-02 NOTE — Procedures (Signed)
NAME: Mallory Klein [facility] DATE OF BIRTH:  1945/06/11 MEDICAL RECORD NUMBER [id number]  LOCATION: [facility]  PHYSICIAN: Weaver OF STUDY: 06/30/2016  SLEEP STUDY TYPE: Out of Center Sleep Test           CLINICAL INFORMATION  Sleep Study Type: NPSG  Indication for sleep study: Hypertension, Snoring  Epworth Sleepiness Score: 15   SLEEP STUDY TECHNIQUE  As per the AASM Manual for the Scoring of Sleep and Associated Events v2.3 (April 2016) with a hypopnea requiring 4% desaturations.  The channels recorded and monitored were frontal, central and occipital EEG, electrooculogram (EOG), submentalis EMG (chin), nasal and oral airflow, thoracic and abdominal wall motion, anterior tibialis EMG, snore microphone, electrocardiogram, and pulse oximetry.   MEDICATIONS  Medications self-administered by patient taken the night of the study : OXYCODONE HCL, DEXILANT, ZYRTEC, OXYBUTYNIN, VERAPAMIL ER, VITAMIN D3. Meds reviewed per chart review.  SLEEP ARCHITECTURE  The study was initiated at 11:37:25 PM and ended at 5:38:46 AM. Sleep onset time was 64.3 minutes and the sleep efficiency was 12.3%. The total sleep time was 44.5 minutes. She had frequent awakenings during the night. Stage REM latency was N/A minutes. The patient spent 100.00% of the night in stage N1 sleep, 0.00% in stage N2 sleep, 0.00% in stage N3 and 0.00% in REM. Alpha intrusion was absent. Supine sleep was 0.00%.  RESPIRATORY PARAMETERS  Patient only had 44.5 minutes of sleep during the night as she had frequent awakenings. The overall apnea/hypopnea index (AHI) was 24.3 per hour. There were 14 total apneas, including 14 obstructive, 0 central and 0 mixed apneas. There were 4 hypopneas and 3 RERAs. The AHI during Stage REM sleep was N/A per hour. AHI while supine was N/A per hour. The mean oxygen saturation was 91.88%. The minimum SpO2 during sleep was 83.00%. Moderate snoring was  noted during this study.  CARDIAC DATA  The 2 lead EKG demonstrated sinus rhythm. The mean heart rate was 82.70 beats per minute. Other EKG findings include: PVCs.   LEG MOVEMENT DATA  The total PLMS were 0 with a resulting PLMS index of 0.00. Associated arousal with leg movement index was 0.0 .  IMPRESSIONS  1. Moderate obstructive sleep apnea occurred during this study (AHI = 24.3/h). The sleep study was only 45 minutes as patient had frequent awakenings related to apneic events. 2. No significant central sleep apnea occurred during this study (CAI = 0.0/h). 3. Mild oxygen desaturation was noted during this study (Min O2 = 83.00%). 4. The patient snored with Moderate snoring volume. 5. EKG findings include PVCs. 6. Clinically significant periodic limb movements did not occur during sleep. No significant associated arousals.  DIAGNOSIS  1. Obstructive Sleep Apnea (327.23 [G47.33 ICD-10])  RECOMMENDATIONS  1. Therapeutic CPAP titration to determine optimal pressure required to alleviate sleep disordered breathing. Alternatively, may try patient on autocpap 5-15 cm water. She will need a mask fitting session to determine best mask fit. She will need a 1 month download to determine cpap efficacy. As the patient is very symptomatic and complains of frequent awakenings and has issues falling asleep in the lab,  We will try her on autocpap.  2. Avoid alcohol, sedatives and other CNS depressants that may worsen sleep apnea and disrupt normal sleep architecture. 3. Sleep hygiene should be reviewed to assess factors that may improve sleep quality. 4. Weight management and regular exercise should be initiated or continued if appropriate. 5.  Follow up in the office 4-6 weeks after obtaining cpap machine.       Monica Becton, MD 07/02/2016, 4:50 PM [facility] Pager [phone] After 3 pm or if no answer, call [phone]

## 2016-07-14 NOTE — Telephone Encounter (Signed)
Spoke with pt and she did not feel comfortable placing the order yet for cpap. She agreed to come in and discuss further with AD. An appt was made for tomorrow. Nothing further is needed.

## 2016-07-15 ENCOUNTER — Ambulatory Visit (INDEPENDENT_AMBULATORY_CARE_PROVIDER_SITE_OTHER): Payer: Medicare Other | Admitting: Pulmonary Disease

## 2016-07-15 ENCOUNTER — Encounter: Payer: Self-pay | Admitting: Pulmonary Disease

## 2016-07-15 DIAGNOSIS — R059 Cough, unspecified: Secondary | ICD-10-CM

## 2016-07-15 DIAGNOSIS — J4540 Moderate persistent asthma, uncomplicated: Secondary | ICD-10-CM | POA: Diagnosis not present

## 2016-07-15 DIAGNOSIS — G4733 Obstructive sleep apnea (adult) (pediatric): Secondary | ICD-10-CM | POA: Diagnosis not present

## 2016-07-15 DIAGNOSIS — R05 Cough: Secondary | ICD-10-CM | POA: Diagnosis not present

## 2016-07-15 MED ORDER — FLUTICASONE FUROATE-VILANTEROL 200-25 MCG/INH IN AEPB: 1.0000 | INHALATION_SPRAY | Freq: Every day | RESPIRATORY_TRACT | 0 refills | Status: DC

## 2016-07-15 NOTE — Assessment & Plan Note (Addendum)
She was recently diagnosed with "floppy eyelids" and swollen eyes which is probably related to untreated OSA per eye MD.   Patient was diagnosed with OSA 20 yrs ago. Had snoring, gasping, choking, hypersomnia. Used cpap for years. Occasional sleep talking. She fell through the cracks and has stopped using CPAP machine for many years now. Very symptomatic now. She uses Norco usually 1/xmonth.  Uses Robaxin 1-2 x/month.   In lab study (May 2018): AHI 25. Only had 45 minutes of sleep secondary to frequent awakenings secondary to sleep apnea.  She remains very symptomatic. Plan to start her on auto CPAP.  Plan :  We extensively discussed the diagnosis, pathophysiology, and treatment options for Obstructive Sleep Apnea (OSA).   We discussed treatment options for OSA including CPAP, BiPaP, as well as surgical options and oral devices.   We will start patient on her CPAP 5-15 centimeters water. She will need a mask fitting session.  Patient was instructed to call the office if he/she has not received the PAP device in 1-2 weeks.  Patient was instructed to have mask, tubings, filter, reservoir cleaned at least once a week with soapy water.  Patient was instructed to call the office if he/she is having issues with the PAP device.    I advised patient to obtain sufficient amount of sleep --  7 to 8 hours at least in a 24 hr period.  Patient was advised to follow good sleep hygiene.  Patient was advised NOT to engage in activities requiring concentration and/or vigilance if he/she is and  sleepy.  Patient is NOT to drive if he/she is sleepy.

## 2016-07-15 NOTE — Assessment & Plan Note (Addendum)
Her cough is likely a manifestation of her asthma. Cont  Breo, 1 puff daily. Continue Singulair and Zyrtec. Continue PPI for reflux disease as well. Denies upper airway cough syndrome. She is currently not on any nasal sprays.

## 2016-07-15 NOTE — Addendum Note (Signed)
Addended by: Marin Roberts on: 07/15/2016 03:36 PM   Modules accepted: Orders

## 2016-07-15 NOTE — Patient Instructions (Signed)
  It was a pleasure taking care of you today!  You are diagnosed with Obstructive Sleep Apnea or OSA.  You stop breathing  25    times an hour.  You also have asthma.   We will order you an autoCPAP  machine.  Please call the office if you do NOT receive your machine in the next 1-2 weeks.   Please make sure you use your CPAP device everytime you sleep.  We will monitor the usage of your machine per your insurance requirement.  Your insurance company may take the machine from you if you are not using it regularly.   Please clean the mask, tubings, filter, water reservoir with soapy water every week.  Please use distilled water for the water reservoir.   Please call the office or your machine provider (DME company) if you are having issues with the device.   Continue with Breo 200 mcg/puff, 1 puff daily.   Return to clinic in 6-8 weeks with NP.

## 2016-07-15 NOTE — Assessment & Plan Note (Addendum)
Pt  was diagnosed with asthma in her 60s.  Triggers: strong odors, change in weather, pollen, dander. Her asthma has over all been stable.   She has cough for several months now.  She goes into coughing spells.  Uses advair as needed when her "asthma" flares up. She uses zyrtec and singulair daily.   Denies UACS. Denies a lot of congestion.  Denies coughs at night. GERD has been stable.     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  She has been coughing every now and then depending on her asthma triggers.   Chest x-ray in 2018 was unremarkable. Her RAST showed elevated IgE to elm. IgE was 98. Not a lot of eosinophils.     Plan : 1. Her asthma/cough is better on Breo. Continue Breo 200 g per puff, 1 puff daily. Unfortunately, she could not afford meds. Will give her samples. 2. Continue albuterol every 4 hours as needed. 3. Continue Zyrtec and Singulair daily. 4. Advised her to avoid [facility]

## 2016-07-15 NOTE — Progress Notes (Signed)
Subjective:    Patient ID: Mallory Klein, female    DOB: September 15, 1945, 71 y.o.   MRN: [id number]  HPI   This is the case of Mallory Klein, 71 y.o. Female, who was referred by Dr. Walker Kehr  in consultation regarding cough/asthma  and OSA.   As you very well know, patient is a non smoker. She was diagnosed with asthma in her 18s.  Triggers: strong odors, change in weather, pollen, dander. Her asthma has over all been stable.   She has cough for several months now.  She goes into coughing spells.  Uses advair as needed when her "asthma" flares up. She uses zyrtec and singulair daily.   Denies UACS. Denies a lot of congestion.  Denies coughs at night. GERD has been stable.     She was recently diagnosed with "floppy eyelids" and swollen eyes which is probably related to untreated OSA per eye MD.   Patient was diagnosed with OSA 20 yrs ago. Had snoring, gasping, choking, hypersomnia. Used cpap for years. Occasional sleep talking. She fell through the cracks and has stopped using CPAP machine for many years now. Very symptomatic now. S he uses Norco usually 1/xmonth.  Uses Robaxin 1-2 x/month.   ROV 07/15/2016 Patient returns to the office as follow-up on her hypersomnia. Since last seen, she had a lab study but she only slept for 45 minutes and her AHI was 25. She had frequent awakenings secondary to sleep apnea despite she was only able to sleep for 45 minutes. For her, it was a typical night. No issues falling asleep during the night. Her asthma is better. Coughing is better with the [facility] but she ran out of samples and the cough recurred.  Review of Systems  Constitutional: Negative for fever and unexpected weight change.  HENT: Negative for congestion, dental problem, ear pain, nosebleeds, postnasal drip, rhinorrhea, sinus pressure, sneezing, sore throat and trouble swallowing.   Eyes: Negative for redness and itching.  Respiratory: Positive for cough and shortness of breath.  Negative for chest tightness and wheezing.   Cardiovascular: Negative for palpitations and leg swelling.  Gastrointestinal: Negative for nausea and vomiting.  Genitourinary: Negative for dysuria.  Musculoskeletal: Negative for joint swelling.  Skin: Negative for rash.  Neurological: Negative for headaches.  Hematological: Does not bruise/bleed easily.  Psychiatric/Behavioral: Negative for dysphoric mood. The patient is not nervous/anxious.        Objective:   Physical Exam   Vitals:  Vitals:   07/15/16 1422  BP: 124/72  Pulse: (!) 112  SpO2: 95%  Weight: 185 lb 12.8 oz (84.3 kg)  Height: 5' 1.5" (1.562 m)    Constitutional/General:  Pleasant, well-nourished, well-developed, not in any distress,  Comfortably seating.  Well kempt  Body mass index is 34.54 kg/m. Wt Readings from Last 3 Encounters:  07/15/16 185 lb 12.8 oz (84.3 kg)  06/30/16 187 lb (84.8 kg)  03/17/16 196 lb (88.9 kg)     HEENT: Pupils equal and reactive to light and accommodation. Anicteric sclerae. Normal nasal mucosa.   No oral  lesions,  mouth clear,  oropharynx clear, no postnasal drip. (-) Oral thrush. No dental caries.  Airway - Mallampati class III  Neck: No masses. Midline trachea. No JVD, (-) LAD. (-) bruits appreciated.  Respiratory/Chest: Grossly normal chest. (-) deformity. (-) Accessory muscle use.  Symmetric expansion. (-) Tenderness on palpation.  Resonant on percussion.  Diminished BS on both lower lung zones. (-) wheezing, crackles, rhonchi (-) egophony  Cardiovascular: Regular rate and  rhythm, heart sounds normal, no murmur or gallops, no peripheral edema  Gastrointestinal:  Normal bowel sounds. Soft, non-tender. No hepatosplenomegaly.  (-) masses.   Musculoskeletal:  Normal muscle tone. Normal gait.   Extremities: Grossly normal. (-) clubbing, cyanosis.  (-) edema  Skin: (-) rash,lesions seen.   Neurological/Psychiatric : alert, oriented to time, place,  person. Normal mood and affect         Assessment & Plan:  OSA (obstructive sleep apnea) She was recently diagnosed with "floppy eyelids" and swollen eyes which is probably related to untreated OSA per eye MD.   Patient was diagnosed with OSA 20 yrs ago. Had snoring, gasping, choking, hypersomnia. Used cpap for years. Occasional sleep talking. She fell through the cracks and has stopped using CPAP machine for many years now. Very symptomatic now. She uses Norco usually 1/xmonth.  Uses Robaxin 1-2 x/month.   Plan :   We discussed about the diagnosis of Obstructive Sleep Apnea (OSA) and implications of untreated OSA. We discussed about CPAP and BiPaP as possible treatment options.    We will schedule the patient for a sleep study. Plan for a lab study. Didn't dissipate no issues with CPAP. She uses it for many years several years ago but fell through the cracks.   Patient was instructed to call the office if he/she has not heard back from the office 1-2 weeks after the sleep study.   Patient was instructed to call the office if he/she is having issues with the PAP device.   We discussed good sleep hygiene.   Patient was advised not to engage in activities requiring concentration and/or vigilance if he/she is sleepy.  Patient was advised not to drive if he/she is sleepy.     Asthma Pt  was diagnosed with asthma in her 36s.  Triggers: strong odors, change in weather, pollen, dander. Her asthma has over all been stable.   She has cough for several months now.  She goes into coughing spells.  Uses advair as needed when her "asthma" flares up. She uses zyrtec and singulair daily.   Denies UACS. Denies a lot of congestion.  Denies coughs at night. GERD has been stable.     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  She has been coughing every now and then depending on her asthma triggers.   Plan : 1. She currently uses Advair, one puff twice a day. She gets samples  from her primary care doctor. We will try her on Breo daily instead of advair. We will give her a sample. If she feels better on Breo, we will switch her to that from [place]. 2. Continue albuterol every 4 hours as needed. 3. Continue Zyrtec and Singulair daily. 4. Needs PFT, chest x-ray, CBC, IgE, RAST.    Cough Her cough is likely a manifestation of her asthma. Start her on Breo, 1 puff daily. Continue Singulair and Zyrtec. Continue PPI for reflux disease as well. Denies upper airway cough syndrome. She is currently not on any nasal sprays.     Follow-up in 6-8 weeks with her CPAP machine.   Monica Becton, MD 07/15/2016   3:10 PM Pulmonary and [place] Pager: [phone] Office: [phone], Fax: [phone]

## 2016-07-20 ENCOUNTER — Other Ambulatory Visit: Payer: Self-pay | Admitting: Internal Medicine

## 2016-07-21 ENCOUNTER — Encounter: Payer: Self-pay | Admitting: Internal Medicine

## 2016-08-11 ENCOUNTER — Ambulatory Visit: Payer: Medicare Other | Admitting: Pulmonary Disease

## 2016-08-11 ENCOUNTER — Ambulatory Visit: Payer: Medicare Other | Admitting: *Deleted

## 2016-08-11 ENCOUNTER — Ambulatory Visit (INDEPENDENT_AMBULATORY_CARE_PROVIDER_SITE_OTHER): Payer: Medicare Other | Admitting: Adult Health

## 2016-08-11 ENCOUNTER — Encounter: Payer: Self-pay | Admitting: Adult Health

## 2016-08-11 DIAGNOSIS — G4733 Obstructive sleep apnea (adult) (pediatric): Secondary | ICD-10-CM | POA: Diagnosis not present

## 2016-08-11 DIAGNOSIS — J4540 Moderate persistent asthma, uncomplicated: Secondary | ICD-10-CM | POA: Diagnosis not present

## 2016-08-11 DIAGNOSIS — J45901 Unspecified asthma with (acute) exacerbation: Secondary | ICD-10-CM

## 2016-08-11 LAB — PULMONARY FUNCTION TEST
DL/VA % PRED: 113 %
DL/VA: 4.83 ml/min/mmHg/L
DLCO COR: 13.53 ml/min/mmHg
DLCO UNC % PRED: 72 %
DLCO cor % pred: 71 %
DLCO unc: 13.74 ml/min/mmHg
FEF 25-75 PRE: 1.32 L/s
FEF 25-75 Post: 1.11 L/sec
FEF2575-%CHANGE-POST: -15 %
FEF2575-%PRED-POST: 79 %
FEF2575-%Pred-Pre: 94 %
FEV1-%CHANGE-POST: 0 %
FEV1-%Pred-Post: 92 %
FEV1-%Pred-Pre: 93 %
FEV1-PRE: 1.37 L
FEV1-Post: 1.36 L
FEV1FVC-%CHANGE-POST: 6 %
FEV1FVC-%Pred-Pre: 99 %
FEV6-%CHANGE-POST: -2 %
FEV6-%PRED-PRE: 94 %
FEV6-%Pred-Post: 92 %
FEV6-PRE: 1.73 L
FEV6-Post: 1.68 L
FEV6FVC-%Change-Post: 0 %
FEV6FVC-%PRED-PRE: 105 %
FEV6FVC-%Pred-Post: 105 %
FVC-%Change-Post: -6 %
FVC-%PRED-POST: 87 %
FVC-%PRED-PRE: 93 %
FVC-POST: 1.68 L
FVC-PRE: 1.79 L
POST FEV1/FVC RATIO: 81 %
POST FEV6/FVC RATIO: 100 %
Pre FEV1/FVC ratio: 76 %
Pre FEV6/FVC Ratio: 100 %
RV % PRED: 95 %
RV: 1.91 L
TLC % pred: 81 %
TLC: 3.61 L

## 2016-08-11 MED ORDER — FLUTICASONE FUROATE-VILANTEROL 200-25 MCG/INH IN AEPB: 1.0000 | INHALATION_SPRAY | Freq: Every day | RESPIRATORY_TRACT | 0 refills | Status: AC

## 2016-08-11 NOTE — Progress Notes (Signed)
@Patient  ID: Mallory Klein, female    DOB: 1945-11-19, 71 y.o.   MRN: [id number]  Chief Complaint  Patient presents with  . Follow-up    dyspnea     Referring provider: Cassandria Anger, MD  HPI: 71 year old female minimal smoking history  seen for pulmonary consult 05/12/2016 for asthma, cough, obstructive sleep apnea  06/2016 [facility] AHI 25/hr   08/11/2016 Follow up : Asthma/OSA  Patient returns for a one-month follow-up. Patient was seen last visit for cough and asthma. She was recommended to have a pulmonary function test. This was done today and showed FEV1 at 93%, ratio 76, FVC 93%, no significant bronchodilator response, DLCO 72%.  Labs showed IgE at 98, RAST min positive for elm tree.  Eosinophils okay.  She continues on BREO and Singulair daily. She has depends on samples bc breo is too expensive. We discussed looking at her formulary to see what is best coveraed.  She does have sinus drainage . Takes Zyrtec which helps some.  Feels like the BREO her her cough and dyspnea.    She was recently diagnosed with sleep apnea. She has been recommended to start on CPAP . She has not received her machine.     Allergies  Allergen Reactions  . Ace Inhibitors     Patient doesn't recall reaction.  jkl  . Aspirin Other (See Comments)    bruising  . Citalopram Hydrobromide Diarrhea    Immunization History  Administered Date(s) Administered  . Influenza Split 11/20/2010, 12/04/2011  . Influenza Whole 12/02/2007, 01/03/2009  . Influenza, High Dose Seasonal PF 12/03/2015  . Influenza,inj,Quad PF,36+ Mos 10/21/2012, 02/09/2014, 02/05/2015  . Meningococcal Polysaccharide 03/29/2006  . Pneumococcal Conjugate-13 04/25/2013  . Pneumococcal Polysaccharide-23 03/29/2006, 03/02/2011  . Td 07/19/2012  . Zoster 03/19/2011    Past Medical History:  Diagnosis Date  . Anxiety   . Asthma   . Depression   . Diverticulosis of colon (without mention of hemorrhage)   . Esophageal  stricture   . GERD (gastroesophageal reflux disease)   . Heart murmur   . Hypertension   . Internal hemorrhoids without mention of complication   . Osteoarthritis   . Shortness of breath dyspnea    with allergies    Tobacco History: History  Smoking Status  . Never Smoker  Smokeless Tobacco  . Never Used   Counseling given: Not Answered   Outpatient Encounter Prescriptions as of 08/11/2016  Medication Sig  . Albuterol Sulfate (PROAIR RESPICLICK) 299 (90 BASE) MCG/ACT AEPB Inhale 1-2 puffs into the lungs 4 (four) times daily as needed.  . ALPRAZolam (XANAX) 0.5 MG tablet Take 1 tablet (0.5 mg total) by mouth 2 (two) times daily as needed. (Patient taking differently: Take 0.5 mg by mouth 2 (two) times daily as needed for anxiety. )  . cetirizine (ZYRTEC) 10 MG tablet Take 10 mg by mouth daily.    . Cholecalciferol (VITAMIN D3) 1000 UNITS tablet Take 2 tablets by mouth once daily   . dexlansoprazole (DEXILANT) 60 MG capsule Take 1 capsule (60 mg total) by mouth daily.  . diclofenac sodium (VOLTAREN) 1 % GEL Apply 4 g topically 4 (four) times daily. As directed. (Patient taking differently: Apply 4 g topically 4 (four) times daily as needed (for pain resulting from spinal stenosis/arthritis.). )  . fluticasone furoate-vilanterol (BREO ELLIPTA) 200-25 MCG/INH AEPB Inhale 1 puff into the lungs daily.  . furosemide (LASIX) 20 MG tablet Take 1-2 tablets (20-40 mg total) by mouth daily as  needed for edema.  [name] HYDROcodone-acetaminophen (NORCO) 10-325 MG tablet Take 1 tablet by mouth every 6 (six) hours as needed. (Patient taking differently: Take 1 tablet by mouth every 6 (six) hours as needed (for pain.). )  . methocarbamol (ROBAXIN) 750 MG tablet Take 1 tablet (750 mg total) by mouth daily as needed for muscle spasms.  . montelukast (SINGULAIR) 10 MG tablet TAKE 1 TABLET BY MOUTH EVERY DAY  . Omega-3 Fatty Acids (FISH OIL) 1000 MG CAPS Take 1 capsule by mouth daily.  . ondansetron (ZOFRAN)  4 MG tablet Take 1 tablet (4 mg total) by mouth every 6 (six) hours.  [name] oxybutynin (DITROPAN) 5 MG tablet TAKE 1 TABLET BY MOUTH EVERY 8 HOURS AS NEEDED FOR BLADDER SPASMS  . oxyCODONE (OXY IR/ROXICODONE) 5 MG immediate release tablet   . promethazine (PHENERGAN) 12.5 MG suppository Place 1 suppository (12.5 mg total) rectally every 6 (six) hours as needed for nausea or vomiting.  . tolterodine (DETROL LA) 4 MG 24 hr capsule Take 1 capsule (4 mg total) by mouth daily.  [name] venlafaxine XR (EFFEXOR XR) 150 MG 24 hr capsule Take 1 capsule (150 mg total) by mouth daily with breakfast.  . verapamil (VERELAN PM) 180 MG 24 hr capsule Take 1 capsule (180 mg total) by mouth at bedtime.  . [DISCONTINUED] fluticasone furoate-vilanterol (BREO ELLIPTA) 100-25 MCG/INH AEPB Inhale 1 puff into the lungs daily. (Patient not taking: Reported on 08/11/2016)  . [DISCONTINUED] Fluticasone-Salmeterol (ADVAIR DISKUS) 250-50 MCG/DOSE AEPB Inhale 1 puff into the lungs every 12 (twelve) hours. (Patient not taking: Reported on 08/11/2016)  . [DISCONTINUED] methylPREDNISolone acetate (DEPO-MEDROL) injection 40 mg    No facility-administered encounter medications on file as of 08/11/2016.      Review of Systems  Constitutional:   No  weight loss, night sweats,  Fevers, chills, + fatigue, or  lassitude.  HEENT:   No headaches,  Difficulty swallowing,  Tooth/dental problems, or  Sore throat,                No sneezing, itching, ear ache, nasal congestion, post nasal drip,   CV:  No chest pain,  Orthopnea, PND, , anasarca, dizziness, palpitations, syncope.   GI  No heartburn, indigestion, abdominal pain, nausea, vomiting, diarrhea, change in bowel habits, loss of appetite, bloody stools.   Resp:    No chest wall deformity  Skin: no rash or lesions.  GU: no dysuria, change in color of urine, no urgency or frequency.  No flank pain, no hematuria   MS:  No joint pain or swelling.  No decreased range of motion.  No back  pain.    Physical Exam  BP 126/74 (BP Location: Left Arm, Cuff Size: Normal)   Pulse 88   Ht 5' (1.524 m)   Wt 181 lb (82.1 kg)   SpO2 97%   BMI 35.35 kg/m   GEN: A/Ox3; pleasant , NAD, obese    HEENT:  [place]/AT,  EACs-clear, TMs-wnl, NOSE-clear, THROAT-clear, no lesions, no postnasal drip or exudate noted.   NECK:  Supple w/ fair ROM; no JVD; normal carotid impulses w/o bruits; no thyromegaly or nodules palpated; no lymphadenopathy.    RESP  Clear  P & A; w/o, wheezes/ rales/ or rhonchi. no accessory muscle use, no dullness to percussion  CARD:  RRR, no m/r/g, tr peripheral edema, pulses intact, no cyanosis or clubbing.  GI:   Soft & nt; nml bowel sounds; no organomegaly or masses detected.   Musco: Warm bil,  no deformities or joint swelling noted.   Neuro: alert, no focal deficits noted.    Skin: Warm, no lesions or rashes    Lab Results:  CBC  BNP No results found for: BNP  ProBNP No results found for: PROBNP  Imaging: No results found.   Assessment & Plan:   Asthma Doing well w/out flare -lung fxn preserved on PFT.  Cont to control for triggers.  May need to look at pt assistance if not able to afford BREO .   Plan  Patient Instructions  Please bring in your drug formulary so we can see what is covered best .  Continue on BREO daily , rinse after use.  Continue Singulair .and Zyrtec 10mg  daily.  We will check to see when your CPAP will be ready .  Follow up with Dr. Halford Chessman  In 3 months and As needed       OSA (obstructive sleep apnea) Moderate OSA  Need to start on CPAP q.hs  Check with DME on arrival of CPAP .      Rexene Edison, NP 08/11/2016

## 2016-08-11 NOTE — Addendum Note (Signed)
Addended by: Parke Poisson E on: 08/11/2016 02:59 PM   Modules accepted: Orders

## 2016-08-11 NOTE — Assessment & Plan Note (Signed)
Moderate OSA  Need to start on CPAP q.hs  Check with DME on arrival of CPAP .

## 2016-08-11 NOTE — Patient Instructions (Addendum)
Please bring in your drug formulary so we can see what is covered best .  Continue on BREO daily , rinse after use.  Continue Singulair .and Zyrtec 10mg  daily.  We will check to see when your CPAP will be ready .  Follow up with Dr. Halford Chessman  In 3 months and As needed

## 2016-08-11 NOTE — Assessment & Plan Note (Addendum)
Doing well w/out flare -lung fxn preserved on PFT.  Cont to control for triggers.  May need to look at pt assistance if not able to afford BREO .   Plan  Patient Instructions  Please bring in your drug formulary so we can see what is covered best .  Continue on BREO daily , rinse after use.  Continue Singulair .and Zyrtec 10mg  daily.  We will check to see when your CPAP will be ready .  Follow up with Dr. Halford Chessman  In 3 months and As needed

## 2016-08-12 NOTE — Progress Notes (Signed)
I have reviewed and agree with assessment/plan.  Chesley Mires, MD [facility] Pulmonary/Critical Care 08/12/2016, 9:56 AM Pager:  [phone]

## 2016-08-18 ENCOUNTER — Other Ambulatory Visit: Payer: Self-pay | Admitting: Internal Medicine

## 2016-09-07 ENCOUNTER — Other Ambulatory Visit: Payer: Self-pay | Admitting: Internal Medicine

## 2016-10-05 ENCOUNTER — Telehealth: Payer: Self-pay | Admitting: Emergency Medicine

## 2016-10-05 NOTE — Telephone Encounter (Signed)
PT LVM on my extension asking to make an appt with PCP. Please contact pt.

## 2016-10-06 ENCOUNTER — Other Ambulatory Visit: Payer: Self-pay | Admitting: Internal Medicine

## 2016-10-06 NOTE — Telephone Encounter (Signed)
LVM for Pt to call back to make appt

## 2016-10-09 ENCOUNTER — Other Ambulatory Visit (INDEPENDENT_AMBULATORY_CARE_PROVIDER_SITE_OTHER): Payer: Medicare Other

## 2016-10-09 ENCOUNTER — Ambulatory Visit (INDEPENDENT_AMBULATORY_CARE_PROVIDER_SITE_OTHER): Payer: Medicare Other | Admitting: Internal Medicine

## 2016-10-09 ENCOUNTER — Encounter: Payer: Self-pay | Admitting: Internal Medicine

## 2016-10-09 VITALS — BP 120/78 | HR 97 | Temp 98.6°F | Ht 60.0 in | Wt 183.0 lb

## 2016-10-09 DIAGNOSIS — R7309 Other abnormal glucose: Secondary | ICD-10-CM

## 2016-10-09 DIAGNOSIS — J4540 Moderate persistent asthma, uncomplicated: Secondary | ICD-10-CM

## 2016-10-09 DIAGNOSIS — F411 Generalized anxiety disorder: Secondary | ICD-10-CM

## 2016-10-09 DIAGNOSIS — I10 Essential (primary) hypertension: Secondary | ICD-10-CM | POA: Diagnosis not present

## 2016-10-09 DIAGNOSIS — G8929 Other chronic pain: Secondary | ICD-10-CM

## 2016-10-09 DIAGNOSIS — M5440 Lumbago with sciatica, unspecified side: Secondary | ICD-10-CM

## 2016-10-09 LAB — HEMOGLOBIN A1C: HEMOGLOBIN A1C: 6.1 % (ref 4.6–6.5)

## 2016-10-09 LAB — BASIC METABOLIC PANEL
BUN: 11 mg/dL (ref 6–23)
CALCIUM: 9 mg/dL (ref 8.4–10.5)
CO2: 31 mEq/L (ref 19–32)
Chloride: 104 mEq/L (ref 96–112)
Creatinine, Ser: 0.9 mg/dL (ref 0.40–1.20)
GFR: 79.42 mL/min (ref >60.00)
GLUCOSE: 113 mg/dL — AB (ref 70–99)
Potassium: 4 mEq/L (ref 3.5–5.1)
SODIUM: 142 meq/L (ref 135–145)

## 2016-10-09 LAB — HEPATIC FUNCTION PANEL
ALT: 6 U/L (ref 0–35)
AST: 11 U/L (ref 0–37)
Albumin: 3.5 g/dL (ref 3.5–5.2)
Alkaline Phosphatase: 72 U/L (ref 39–117)
BILIRUBIN DIRECT: 0.2 mg/dL (ref 0.0–0.3)
BILIRUBIN TOTAL: 0.3 mg/dL (ref 0.2–1.2)
TOTAL PROTEIN: 7.3 g/dL (ref 6.0–8.3)

## 2016-10-09 MED ORDER — VERAPAMIL HCL ER 180 MG PO CP24: 180.0000 mg | ORAL_CAPSULE | Freq: Every day | ORAL | 11 refills | Status: DC

## 2016-10-09 MED ORDER — VENLAFAXINE HCL ER 150 MG PO CP24: 150.0000 mg | ORAL_CAPSULE | Freq: Every day | ORAL | 11 refills | Status: DC

## 2016-10-09 NOTE — Progress Notes (Signed)
Subjective:  Patient ID: Mallory Klein, female    DOB: Sep 17, 1945  Age: 71 y.o. MRN: [id number]  CC: No chief complaint on file.   HPI OLIVEA SONNEN presents for OA, LBP, glut pain L>R, anxiety f/u  Outpatient Medications Prior to Visit  Medication Sig Dispense Refill  . ALPRAZolam (XANAX) 0.5 MG tablet Take 1 tablet (0.5 mg total) by mouth 2 (two) times daily as needed. (Patient taking differently: Take 0.5 mg by mouth 2 (two) times daily as needed for anxiety. ) 180 tablet 0  . cetirizine (ZYRTEC) 10 MG tablet Take 10 mg by mouth daily.      . Cholecalciferol (VITAMIN D3) 1000 UNITS tablet Take 2 tablets by mouth once daily     . dexlansoprazole (DEXILANT) 60 MG capsule Take 1 capsule (60 mg total) by mouth daily. 90 capsule 3  . diclofenac sodium (VOLTAREN) 1 % GEL Apply 4 g topically 4 (four) times daily. As directed. (Patient taking differently: Apply 4 g topically 4 (four) times daily as needed (for pain resulting from spinal stenosis/arthritis.). ) 200 g 3  . furosemide (LASIX) 20 MG tablet Take 1-2 tablets (20-40 mg total) by mouth daily as needed for edema. 60 tablet 3  . HYDROcodone-acetaminophen (NORCO) 10-325 MG tablet Take 1 tablet by mouth every 6 (six) hours as needed. (Patient taking differently: Take 1 tablet by mouth every 6 (six) hours as needed (for pain.). ) 120 tablet 0  . methocarbamol (ROBAXIN) 750 MG tablet Take 1 tablet (750 mg total) by mouth daily as needed for muscle spasms. 30 tablet 3  . montelukast (SINGULAIR) 10 MG tablet TAKE 1 TABLET BY MOUTH EVERY DAY 90 tablet 1  . Omega-3 Fatty Acids (FISH OIL) 1000 MG CAPS Take 1 capsule by mouth daily.    . ondansetron (ZOFRAN) 4 MG tablet Take 1 tablet (4 mg total) by mouth every 6 (six) hours. 12 tablet 0  . oxybutynin (DITROPAN) 5 MG tablet TAKE 1 TABLET BY MOUTH EVERY 8 HOURS AS NEEDED FOR BLADDER SPASMS 90 tablet 0  . oxyCODONE (OXY IR/ROXICODONE) 5 MG immediate release tablet     . promethazine  (PHENERGAN) 12.5 MG suppository Place 1 suppository (12.5 mg total) rectally every 6 (six) hours as needed for nausea or vomiting. 12 suppository 0  . tolterodine (DETROL LA) 4 MG 24 hr capsule Take 1 capsule (4 mg total) by mouth daily. 90 capsule 3  . venlafaxine XR (EFFEXOR XR) 150 MG 24 hr capsule Take 1 capsule (150 mg total) by mouth daily with breakfast. 30 capsule 5  . verapamil (VERELAN PM) 180 MG 24 hr capsule Take 1 capsule (180 mg total) by mouth at bedtime. No further re-fills without an appointment. 30 capsule 0  . Albuterol Sulfate (PROAIR RESPICLICK) 846 (90 BASE) MCG/ACT AEPB Inhale 1-2 puffs into the lungs 4 (four) times daily as needed. 1 each 11   No facility-administered medications prior to visit.     ROS Review of Systems  Constitutional: Negative for activity change, appetite change, chills, fatigue and unexpected weight change.  HENT: Negative for congestion, mouth sores and sinus pressure.   Eyes: Negative for visual disturbance.  Respiratory: Negative for cough and chest tightness.   Gastrointestinal: Negative for abdominal pain and nausea.  Genitourinary: Negative for difficulty urinating, frequency and vaginal pain.  Musculoskeletal: Positive for arthralgias, back pain and gait problem.  Skin: Negative for pallor and rash.  Neurological: Positive for weakness. Negative for dizziness, tremors, numbness and headaches.  Psychiatric/Behavioral: Negative for confusion, sleep disturbance and suicidal ideas.    Objective:  BP 120/78 (BP Location: Left Arm, Patient Position: Sitting, Cuff Size: Large)   Pulse 97   Temp 98.6 F (37 C) (Oral)   Ht 5' (1.524 m)   Wt 183 lb (83 kg)   SpO2 97%   BMI 35.74 kg/m   BP Readings from Last 3 Encounters:  10/09/16 120/78  08/11/16 126/74  07/15/16 124/72    Wt Readings from Last 3 Encounters:  10/09/16 183 lb (83 kg)  08/11/16 181 lb (82.1 kg)  07/15/16 185 lb 12.8 oz (84.3 kg)    Physical Exam  Constitutional:  She appears well-developed. No distress.  HENT:  Head: Normocephalic.  Right Ear: External ear normal.  Left Ear: External ear normal.  Nose: Nose normal.  Mouth/Throat: Oropharynx is clear and moist.  Eyes: Pupils are equal, round, and reactive to light. Conjunctivae are normal. Right eye exhibits no discharge. Left eye exhibits no discharge.  Neck: Normal range of motion. Neck supple. No JVD present. No tracheal deviation present. No thyromegaly present.  Cardiovascular: Normal rate, regular rhythm and normal heart sounds.   Pulmonary/Chest: No stridor. No respiratory distress. She has no wheezes.  Abdominal: Soft. Bowel sounds are normal. She exhibits no distension and no mass. There is no tenderness. There is no rebound and no guarding.  Musculoskeletal: She exhibits tenderness. She exhibits no edema.  Lymphadenopathy:    She has no cervical adenopathy.  Neurological: She displays normal reflexes. No cranial nerve deficit. She exhibits normal muscle tone. Coordination abnormal.  Skin: No rash noted. No erythema.  Psychiatric: She has a normal mood and affect. Her behavior is normal. Judgment and thought content normal.  LS, hips, knees are tender Walker  Lab Results  Component Value Date   WBC 12.3 (H) 05/12/2016   HGB 10.6 (L) 05/12/2016   HCT 34.2 (L) 05/12/2016   PLT 620.0 (H) 05/12/2016   GLUCOSE 91 03/17/2016   CHOL 219 (H) 02/05/2015   TRIG 69.0 02/05/2015   HDL 73.70 02/05/2015   LDLDIRECT 109.8 02/10/2010   LDLCALC 132 (H) 02/05/2015   ALT 6 03/17/2016   AST 12 03/17/2016   NA 137 03/17/2016   K 4.2 03/17/2016   CL 101 03/17/2016   CREATININE 1.28 (H) 03/17/2016   BUN 13 03/17/2016   CO2 33 (H) 03/17/2016   TSH 0.84 09/03/2015   INR 0.97 02/07/2014   HGBA1C 6.1 12/03/2015    Dg Chest 2 View  Result Date: 05/13/2016 CLINICAL DATA:  Intermittent shortness of Breath. EXAM: CHEST  2 VIEW COMPARISON:  09/22/2015 FINDINGS: Heart is borderline in size. Lingular  scarring, stable. Right lung is clear. No effusions or acute bony abnormality. IMPRESSION: Borderline cardiomegaly.  Lingular scarring.  No active disease. Electronically Signed   By: Rolm Baptise M.D.   On: 05/13/2016 08:06    Assessment & Plan:   There are no diagnoses linked to this encounter. I have discontinued Ms. Eastridge's Albuterol Sulfate. I am also having her maintain her cholecalciferol, cetirizine, diclofenac sodium, Fish Oil, ALPRAZolam, furosemide, HYDROcodone-acetaminophen, methocarbamol, ondansetron, promethazine, tolterodine, venlafaxine XR, dexlansoprazole, oxyCODONE, montelukast, oxybutynin, and verapamil.  No orders of the defined types were placed in this encounter.    Follow-up: No Follow-up on file.  Walker Kehr, MD

## 2016-10-09 NOTE — Assessment & Plan Note (Signed)
Verapamil 

## 2016-10-09 NOTE — Assessment & Plan Note (Signed)
In PT 

## 2016-10-09 NOTE — Assessment & Plan Note (Signed)
Breo, Singulair °

## 2016-10-09 NOTE — Assessment & Plan Note (Signed)
  On diet  

## 2016-10-09 NOTE — Assessment & Plan Note (Signed)
ALPRAZOLAM prn  Potential benefits of a long term benzodiazepines  use as well as potential risks  and complications were explained to the patient and were aknowledged.

## 2016-11-11 ENCOUNTER — Encounter: Payer: Self-pay | Admitting: Pulmonary Disease

## 2016-11-11 ENCOUNTER — Ambulatory Visit (INDEPENDENT_AMBULATORY_CARE_PROVIDER_SITE_OTHER): Payer: Medicare Other | Admitting: Pulmonary Disease

## 2016-11-11 VITALS — BP 130/78 | HR 124 | Ht 61.0 in | Wt 183.0 lb

## 2016-11-11 DIAGNOSIS — G4733 Obstructive sleep apnea (adult) (pediatric): Secondary | ICD-10-CM | POA: Diagnosis not present

## 2016-11-11 DIAGNOSIS — J4540 Moderate persistent asthma, uncomplicated: Secondary | ICD-10-CM | POA: Diagnosis not present

## 2016-11-11 NOTE — Patient Instructions (Signed)
Will have you speak with our patient care coordinators to find out why your CPAP hasn't been set up yet  Call if you need to get a refill on your rescue albuterol inhaler  Follow up in 3 months

## 2016-11-11 NOTE — Progress Notes (Signed)
Current Outpatient Prescriptions on File Prior to Visit  Medication Sig  . ALPRAZolam (XANAX) 0.5 MG tablet Take 1 tablet (0.5 mg total) by mouth 2 (two) times daily as needed. (Patient taking differently: Take 0.5 mg by mouth 2 (two) times daily as needed for anxiety. )  . cetirizine (ZYRTEC) 10 MG tablet Take 10 mg by mouth daily.    . Cholecalciferol (VITAMIN D3) 1000 UNITS tablet Take 2 tablets by mouth once daily   . dexlansoprazole (DEXILANT) 60 MG capsule Take 1 capsule (60 mg total) by mouth daily.  . diclofenac sodium (VOLTAREN) 1 % GEL Apply 4 g topically 4 (four) times daily. As directed. (Patient taking differently: Apply 4 g topically 4 (four) times daily as needed (for pain resulting from spinal stenosis/arthritis.). )  . furosemide (LASIX) 20 MG tablet Take 1-2 tablets (20-40 mg total) by mouth daily as needed for edema.  [name] HYDROcodone-acetaminophen (NORCO) 10-325 MG tablet Take 1 tablet by mouth every 6 (six) hours as needed. (Patient taking differently: Take 1 tablet by mouth every 6 (six) hours as needed (for pain.). )  . methocarbamol (ROBAXIN) 750 MG tablet Take 1 tablet (750 mg total) by mouth daily as needed for muscle spasms.  . montelukast (SINGULAIR) 10 MG tablet TAKE 1 TABLET BY MOUTH EVERY DAY  . Omega-3 Fatty Acids (FISH OIL) 1000 MG CAPS Take 1 capsule by mouth daily.  [name] oxybutynin (DITROPAN) 5 MG tablet TAKE 1 TABLET BY MOUTH EVERY 8 HOURS AS NEEDED FOR BLADDER SPASMS  . oxyCODONE (OXY IR/ROXICODONE) 5 MG immediate release tablet   . promethazine (PHENERGAN) 12.5 MG suppository Place 1 suppository (12.5 mg total) rectally every 6 (six) hours as needed for nausea or vomiting.  . venlafaxine XR (EFFEXOR XR) 150 MG 24 hr capsule Take 1 capsule (150 mg total) by mouth daily with breakfast.  . verapamil (VERELAN PM) 180 MG 24 hr capsule Take 1 capsule (180 mg total) by mouth at bedtime. No further re-fills without an appointment.   No current facility-administered  medications on file prior to visit.      Chief Complaint  Patient presents with  . Follow-up    Pt doesn't have CPAP machine as of yet since last visit. Pt has SOB with extersion.      Pulmonary tests FEV1 1.36 (92%), FEV1% 81, TLC 1.91 (95%), DLCO 72%, no BD  Sleep tests PSG 06/30/16 >> AHI 24.3, SpO2 low 83%  Past medical history Anxiety, depression, Diverticulosis, GERD, Esophageal stricture, HTN  Past surgical history, Family history, Social history, Allergies all reviewed.  Vital Signs BP 130/78 (BP Location: Left Arm, Cuff Size: Normal)   Pulse (!) 124   Ht 5\' 1"  (1.549 m)   Wt 183 lb (83 kg)   SpO2 95%   BMI 34.58 kg/m   History of Present Illness Mallory Klein is a 71 y.o. female with asthma, and sleep apnea.  She hasn't been set up with CPAP yet.  She is still having trouble with her sleep, daytime sleepiness, and snoring.  She gets intermittent episodes of cough and shortness of breath.  This happens more when outside and with exposure to dust.  She doesn't have a rescue inhaler.  She is not having sinus congestion, post nasal drip, sore throat, chest pain, fever, sputum, or rash.  Physical Exam  General - No distress ENT - No sinus tenderness, no oral exudate, no LAN Cardiac - s1s2 regular, no murmur Chest - No wheeze/rales/dullness Back - No focal  tenderness Abd - Soft, non-tender Ext - No edema Neuro - Normal strength Skin - No rashes Psych - normal mood, and behavior   Assessment/Plan  Persistent allergic asthma. - continue breo and singulair - advised her to try using breo later in the day - she will call if she needs refill on rescue inhaler >> discussed when to use her albuterol and difference between maintenance therapy and rescue medications  Obstructive sleep apnea. - discussed her sleep study results with her - will have patient care coordinators investigate why she hasn't received CPAP yet    Patient Instructions  Will have  you speak with our patient care coordinators to find out why your CPAP hasn't been set up yet  Call if you need to get a refill on your rescue albuterol inhaler  Follow up in 3 months   Time spent 28 minutes  Chesley Mires, MD [facility] Pager:  [phone] 11/11/2016, 4:48 PM

## 2016-11-16 ENCOUNTER — Telehealth: Payer: Self-pay | Admitting: Pulmonary Disease

## 2016-11-16 MED ORDER — ALBUTEROL SULFATE HFA 108 (90 BASE) MCG/ACT IN AERS: 2.0000 | INHALATION_SPRAY | Freq: Four times a day (QID) | RESPIRATORY_TRACT | 6 refills | Status: DC | PRN

## 2016-11-16 NOTE — Telephone Encounter (Signed)
Called and spoke with pt and she stated that the took the advair 250/50 before she started the breo.      The refill of the rescue inhaler has been called to the pharmacy.

## 2016-11-17 ENCOUNTER — Other Ambulatory Visit: Payer: Self-pay

## 2016-11-17 MED ORDER — MONTELUKAST SODIUM 10 MG PO TABS: 10.0000 mg | ORAL_TABLET | Freq: Every day | ORAL | 1 refills | Status: DC

## 2016-11-17 NOTE — Telephone Encounter (Signed)
Noted  

## 2016-12-10 ENCOUNTER — Telehealth: Payer: Self-pay | Admitting: Emergency Medicine

## 2016-12-10 NOTE — Telephone Encounter (Signed)
Pt states she was told to let us know about the swelling but she was given a medication for her aches and today it was easier to get around. I did inform the patient to call back if the swelling does worsen or feels like she need to be seen. Pt voiced understanding

## 2016-12-10 NOTE — Telephone Encounter (Signed)
Received voicemail from pt, c/o of l ankle swelling, states she was told to let Dr Camila Li know.   CB [phone]

## 2017-02-12 ENCOUNTER — Ambulatory Visit (INDEPENDENT_AMBULATORY_CARE_PROVIDER_SITE_OTHER): Payer: Medicare Other | Admitting: Internal Medicine

## 2017-02-12 ENCOUNTER — Encounter: Payer: Self-pay | Admitting: Internal Medicine

## 2017-02-12 ENCOUNTER — Other Ambulatory Visit (INDEPENDENT_AMBULATORY_CARE_PROVIDER_SITE_OTHER): Payer: Medicare Other

## 2017-02-12 VITALS — BP 144/82 | HR 85 | Temp 99.1°F | Ht 61.0 in | Wt 186.2 lb

## 2017-02-12 DIAGNOSIS — R7309 Other abnormal glucose: Secondary | ICD-10-CM | POA: Diagnosis not present

## 2017-02-12 DIAGNOSIS — Z23 Encounter for immunization: Secondary | ICD-10-CM

## 2017-02-12 DIAGNOSIS — I10 Essential (primary) hypertension: Secondary | ICD-10-CM

## 2017-02-12 DIAGNOSIS — J4540 Moderate persistent asthma, uncomplicated: Secondary | ICD-10-CM | POA: Diagnosis not present

## 2017-02-12 DIAGNOSIS — R32 Unspecified urinary incontinence: Secondary | ICD-10-CM | POA: Diagnosis not present

## 2017-02-12 DIAGNOSIS — R7989 Other specified abnormal findings of blood chemistry: Secondary | ICD-10-CM

## 2017-02-12 DIAGNOSIS — F4323 Adjustment disorder with mixed anxiety and depressed mood: Secondary | ICD-10-CM | POA: Diagnosis not present

## 2017-02-12 LAB — BASIC METABOLIC PANEL
BUN: 12 mg/dL (ref 6–23)
CALCIUM: 9 mg/dL (ref 8.4–10.5)
CHLORIDE: 101 meq/L (ref 96–112)
CO2: 35 meq/L — AB (ref 19–32)
CREATININE: 0.82 mg/dL (ref 0.40–1.20)
GFR: 88.34 mL/min (ref >60.00)
GLUCOSE: 89 mg/dL (ref 70–99)
Potassium: 4.5 mEq/L (ref 3.5–5.1)
Sodium: 140 mEq/L (ref 135–145)

## 2017-02-12 LAB — IBC PANEL
Iron: 123 ug/dL (ref 42–145)
Saturation Ratios: 25.9 % (ref 20.0–50.0)
Transferrin: 339 mg/dL (ref 212.0–360.0)

## 2017-02-12 LAB — CBC WITH DIFFERENTIAL/PLATELET
BASOS ABS: 0.1 10*3/uL (ref 0.0–0.1)
Basophils Relative: 1.3 % (ref 0.0–3.0)
EOS PCT: 5 % (ref 0.0–5.0)
Eosinophils Absolute: 0.4 10*3/uL (ref 0.0–0.7)
HEMATOCRIT: 28.9 % — AB (ref 36.0–46.0)
Hemoglobin: 9.1 g/dL — ABNORMAL LOW (ref 12.0–15.0)
LYMPHS ABS: 3.7 10*3/uL (ref 0.7–4.0)
LYMPHS PCT: 44.3 % (ref 12.0–46.0)
MCHC: 31.5 g/dL (ref 30.0–36.0)
MCV: 88 fl (ref 78.0–100.0)
MONOS PCT: 6 % (ref 3.0–12.0)
Monocytes Absolute: 0.5 10*3/uL (ref 0.1–1.0)
Neutro Abs: 3.6 10*3/uL (ref 1.4–7.7)
Neutrophils Relative %: 43.4 % (ref 43.0–77.0)
Platelets: 493 10*3/uL — ABNORMAL HIGH (ref 150.0–400.0)
RBC: 3.28 Mil/uL — AB (ref 3.87–5.11)
RDW: 18.9 % — ABNORMAL HIGH (ref 11.5–15.5)
WBC: 8.4 10*3/uL (ref 4.0–10.5)

## 2017-02-12 LAB — HEMOGLOBIN A1C: HEMOGLOBIN A1C: 6.2 % (ref 4.6–6.5)

## 2017-02-12 LAB — TSH: TSH: 0.82 u[IU]/mL (ref 0.35–4.50)

## 2017-02-12 NOTE — Assessment & Plan Note (Signed)
On Breo, Singulair 

## 2017-02-12 NOTE — Progress Notes (Signed)
Subjective:  Patient ID: Mallory Klein, female    DOB: 07-Sep-1945  Age: 71 y.o. MRN: [id number]  CC: No chief complaint on file.   HPI POPPY MCAFEE presents for asthma, DM, anxiety, depression f/u  Outpatient Medications Prior to Visit  Medication Sig Dispense Refill  . albuterol (PROVENTIL HFA;VENTOLIN HFA) 108 (90 Base) MCG/ACT inhaler Inhale 2 puffs into the lungs every 6 (six) hours as needed for wheezing or shortness of breath. 1 Inhaler 6  . ALPRAZolam (XANAX) 0.5 MG tablet Take 1 tablet (0.5 mg total) by mouth 2 (two) times daily as needed. (Patient taking differently: Take 0.5 mg by mouth 2 (two) times daily as needed for anxiety. ) 180 tablet 0  . cetirizine (ZYRTEC) 10 MG tablet Take 10 mg by mouth daily.      . Cholecalciferol (VITAMIN D3) 1000 UNITS tablet Take 2 tablets by mouth once daily     . dexlansoprazole (DEXILANT) 60 MG capsule Take 1 capsule (60 mg total) by mouth daily. 90 capsule 3  . diclofenac sodium (VOLTAREN) 1 % GEL Apply 4 g topically 4 (four) times daily. As directed. (Patient taking differently: Apply 4 g topically 4 (four) times daily as needed (for pain resulting from spinal stenosis/arthritis.). ) 200 g 3  . fluticasone furoate-vilanterol (BREO ELLIPTA) 100-25 MCG/INH AEPB Inhale 1 puff into the lungs daily.    . furosemide (LASIX) 20 MG tablet Take 1-2 tablets (20-40 mg total) by mouth daily as needed for edema. 60 tablet 3  . HYDROcodone-acetaminophen (NORCO) 10-325 MG tablet Take 1 tablet by mouth every 6 (six) hours as needed. (Patient taking differently: Take 1 tablet by mouth every 6 (six) hours as needed (for pain.). ) 120 tablet 0  . methocarbamol (ROBAXIN) 750 MG tablet Take 1 tablet (750 mg total) by mouth daily as needed for muscle spasms. 30 tablet 3  . montelukast (SINGULAIR) 10 MG tablet Take 1 tablet (10 mg total) by mouth daily. 90 tablet 1  . Omega-3 Fatty Acids (FISH OIL) 1000 MG CAPS Take 1 capsule by mouth daily.    [name]  oxybutynin (DITROPAN) 5 MG tablet TAKE 1 TABLET BY MOUTH EVERY 8 HOURS AS NEEDED FOR BLADDER SPASMS 90 tablet 0  . oxyCODONE (OXY IR/ROXICODONE) 5 MG immediate release tablet     . promethazine (PHENERGAN) 12.5 MG suppository Place 1 suppository (12.5 mg total) rectally every 6 (six) hours as needed for nausea or vomiting. 12 suppository 0  . venlafaxine XR (EFFEXOR XR) 150 MG 24 hr capsule Take 1 capsule (150 mg total) by mouth daily with breakfast. 30 capsule 11  . verapamil (VERELAN PM) 180 MG 24 hr capsule Take 1 capsule (180 mg total) by mouth at bedtime. No further re-fills without an appointment. 30 capsule 11   No facility-administered medications prior to visit.     ROS Review of Systems  Constitutional: Positive for fatigue. Negative for activity change, appetite change, chills and unexpected weight change.  HENT: Negative for congestion, mouth sores and sinus pressure.   Eyes: Negative for visual disturbance.  Respiratory: Negative for cough and chest tightness.   Gastrointestinal: Negative for abdominal pain and nausea.  Genitourinary: Negative for difficulty urinating, frequency and vaginal pain.  Musculoskeletal: Positive for arthralgias, back pain and gait problem.  Skin: Negative for pallor and rash.  Neurological: Negative for dizziness, tremors, weakness, numbness and headaches.  Psychiatric/Behavioral: Positive for dysphoric mood. Negative for confusion, sleep disturbance and suicidal ideas. The patient is nervous/anxious.  Objective:  There were no vitals taken for this visit.  BP Readings from Last 3 Encounters:  11/11/16 130/78  10/09/16 120/78  08/11/16 126/74    Wt Readings from Last 3 Encounters:  11/11/16 183 lb (83 kg)  10/09/16 183 lb (83 kg)  08/11/16 181 lb (82.1 kg)    Physical Exam  Constitutional: She appears well-developed. No distress.  HENT:  Head: Normocephalic.  Right Ear: External ear normal.  Left Ear: External ear normal.  Nose:  Nose normal.  Mouth/Throat: Oropharynx is clear and moist.  Eyes: Conjunctivae are normal. Pupils are equal, round, and reactive to light. Right eye exhibits no discharge. Left eye exhibits no discharge.  Neck: Normal range of motion. Neck supple. No JVD present. No tracheal deviation present. No thyromegaly present.  Cardiovascular: Normal rate, regular rhythm and normal heart sounds.  Pulmonary/Chest: No stridor. No respiratory distress. She has no wheezes.  Abdominal: Soft. Bowel sounds are normal. She exhibits no distension and no mass. There is no tenderness. There is no rebound and no guarding.  Musculoskeletal: She exhibits tenderness. She exhibits no edema.  Lymphadenopathy:    She has no cervical adenopathy.  Neurological: She displays normal reflexes. No cranial nerve deficit. She exhibits normal muscle tone. Coordination abnormal.  Skin: No rash noted. No erythema.  Psychiatric: She has a normal mood and affect. Her behavior is normal. Judgment and thought content normal.  in a w/c  Lab Results  Component Value Date   WBC 12.3 (H) 05/12/2016   HGB 10.6 (L) 05/12/2016   HCT 34.2 (L) 05/12/2016   PLT 620.0 (H) 05/12/2016   GLUCOSE 113 (H) 10/09/2016   CHOL 219 (H) 02/05/2015   TRIG 69.0 02/05/2015   HDL 73.70 02/05/2015   LDLDIRECT 109.8 02/10/2010   LDLCALC 132 (H) 02/05/2015   ALT 6 10/09/2016   AST 11 10/09/2016   NA 142 10/09/2016   K 4.0 10/09/2016   CL 104 10/09/2016   CREATININE 0.90 10/09/2016   BUN 11 10/09/2016   CO2 31 10/09/2016   TSH 0.84 09/03/2015   INR 0.97 02/07/2014   HGBA1C 6.1 10/09/2016    Dg Chest 2 View  Result Date: 05/13/2016 CLINICAL DATA:  Intermittent shortness of Breath. EXAM: CHEST  2 VIEW COMPARISON:  09/22/2015 FINDINGS: Heart is borderline in size. Lingular scarring, stable. Right lung is clear. No effusions or acute bony abnormality. IMPRESSION: Borderline cardiomegaly.  Lingular scarring.  No active disease. Electronically Signed    By: Rolm Baptise M.D.   On: 05/13/2016 08:06    Assessment & Plan:   There are no diagnoses linked to this encounter. I am having Merry P. Mickelsen maintain her cholecalciferol, cetirizine, diclofenac sodium, Fish Oil, ALPRAZolam, furosemide, HYDROcodone-acetaminophen, methocarbamol, promethazine, dexlansoprazole, oxyCODONE, oxybutynin, verapamil, venlafaxine XR, fluticasone furoate-vilanterol, albuterol, and montelukast.  No orders of the defined types were placed in this encounter.    Follow-up: No Follow-up on file.  Walker Kehr, MD

## 2017-02-12 NOTE — Assessment & Plan Note (Addendum)
Dad died in  3 wks ago - discussed

## 2017-02-12 NOTE — Addendum Note (Signed)
Addended by: Aviva Signs M on: 02/12/2017 03:19 PM   Modules accepted: Orders

## 2017-02-12 NOTE — Assessment & Plan Note (Signed)
Urology appt pending

## 2017-02-12 NOTE — Assessment & Plan Note (Addendum)
Labs A1c 

## 2017-02-12 NOTE — Assessment & Plan Note (Addendum)
Chronic normocytic anemia and thrombocytosis: worse CBC

## 2017-02-12 NOTE — Assessment & Plan Note (Signed)
Verapamil 

## 2017-02-13 ENCOUNTER — Encounter: Payer: Self-pay | Admitting: Internal Medicine

## 2017-02-15 ENCOUNTER — Other Ambulatory Visit: Payer: Self-pay | Admitting: Internal Medicine

## 2017-02-15 DIAGNOSIS — D649 Anemia, unspecified: Secondary | ICD-10-CM

## 2017-02-19 ENCOUNTER — Telehealth: Payer: Self-pay | Admitting: Hematology

## 2017-02-19 NOTE — Telephone Encounter (Signed)
Patient returned call re setting up new patient appointment. Patient was given appointment with Dr.  Burr Medico for 03/09/2017 @ 2:30pm - date/time/loation. Epic message sent to referring confirming appointment.

## 2017-03-09 ENCOUNTER — Encounter: Payer: Self-pay | Admitting: Nurse Practitioner

## 2017-03-09 ENCOUNTER — Telehealth: Payer: Self-pay | Admitting: Nurse Practitioner

## 2017-03-09 ENCOUNTER — Inpatient Hospital Stay: Payer: Medicare Other | Attending: Hematology | Admitting: Nurse Practitioner

## 2017-03-09 VITALS — BP 158/77 | HR 87 | Temp 98.3°F | Resp 17

## 2017-03-09 DIAGNOSIS — D72829 Elevated white blood cell count, unspecified: Secondary | ICD-10-CM | POA: Insufficient documentation

## 2017-03-09 DIAGNOSIS — I10 Essential (primary) hypertension: Secondary | ICD-10-CM | POA: Diagnosis not present

## 2017-03-09 DIAGNOSIS — J45909 Unspecified asthma, uncomplicated: Secondary | ICD-10-CM | POA: Insufficient documentation

## 2017-03-09 DIAGNOSIS — D649 Anemia, unspecified: Secondary | ICD-10-CM

## 2017-03-09 DIAGNOSIS — D696 Thrombocytopenia, unspecified: Secondary | ICD-10-CM | POA: Insufficient documentation
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
## 2017-03-09 NOTE — Telephone Encounter (Signed)
Scheduled appt per 1/15 los - Gave patient AVS and calender per los.  

## 2017-03-10 ENCOUNTER — Encounter: Payer: Self-pay | Admitting: Nurse Practitioner

## 2017-03-11 ENCOUNTER — Inpatient Hospital Stay: Payer: Medicare Other

## 2017-03-11 ENCOUNTER — Encounter: Payer: Self-pay | Admitting: Nurse Practitioner

## 2017-03-11 DIAGNOSIS — D649 Anemia, unspecified: Secondary | ICD-10-CM | POA: Diagnosis not present

## 2017-03-11 LAB — CBC WITH DIFFERENTIAL (CANCER CENTER ONLY)
Basophils Absolute: 0.1 10*3/uL (ref 0.0–0.1)
Basophils Relative: 1 %
Eosinophils Absolute: 0.5 10*3/uL (ref 0.0–0.5)
Eosinophils Relative: 6 %
HEMATOCRIT: 30.3 % — AB (ref 34.8–46.6)
Hemoglobin: 9.2 g/dL — ABNORMAL LOW (ref 11.6–15.9)
LYMPHS PCT: 35 %
Lymphs Abs: 3.4 10*3/uL — ABNORMAL HIGH (ref 0.9–3.3)
MCH: 26.9 pg (ref 25.1–34.0)
MCHC: 30.4 g/dL — AB (ref 31.5–36.0)
MCV: 88.6 fL (ref 79.5–101.0)
MONOS PCT: 6 %
Monocytes Absolute: 0.6 10*3/uL (ref 0.1–0.9)
NEUTROS ABS: 5.3 10*3/uL (ref 1.5–6.5)
Neutrophils Relative %: 54 %
Platelet Count: 335 10*3/uL (ref 145–400)
RBC: 3.42 MIL/uL — ABNORMAL LOW (ref 3.70–5.45)
RDW: 20.4 % — AB (ref 11.2–16.1)
WBC Count: 9.9 10*3/uL (ref 3.9–10.3)

## 2017-03-11 LAB — C-REACTIVE PROTEIN: CRP: <0.8 mg/dL (ref <1.0)

## 2017-03-11 LAB — SEDIMENTATION RATE: SED RATE: 51 mm/h — AB (ref 0–22)

## 2017-03-11 LAB — IRON AND TIBC
Iron: 24 ug/dL — ABNORMAL LOW (ref 41–142)
SATURATION RATIOS: 6 % — AB (ref 21–57)
TIBC: 419 ug/dL (ref 236–444)
UIBC: 395 ug/dL

## 2017-03-11 LAB — VITAMIN B12: Vitamin B-12: 217 pg/mL (ref 180–914)

## 2017-03-11 LAB — FERRITIN: FERRITIN: 6 ng/mL — AB (ref 9–269)

## 2017-03-11 LAB — RETICULOCYTES
RBC.: 3.42 MIL/uL — ABNORMAL LOW (ref 3.70–5.45)
Retic Count, Absolute: 61.6 10*3/uL (ref 33.7–90.7)
Retic Ct Pct: 1.8 % (ref 0.7–2.1)

## 2017-03-12 LAB — FOLATE RBC
Folate, Hemolysate: 239.2 ng/mL
Folate, RBC: 800 ng/mL (ref >498)
HEMATOCRIT: 29.9 % — AB (ref 34.0–46.6)

## 2017-03-12 LAB — ERYTHROPOIETIN: Erythropoietin: 104.5 m[IU]/mL — ABNORMAL HIGH (ref 2.6–18.5)

## 2017-03-15 LAB — METHYLMALONIC ACID, SERUM: Methylmalonic Acid, Quantitative: 132 nmol/L (ref 0–378)

## 2017-03-19 ENCOUNTER — Telehealth: Payer: Self-pay | Admitting: Nurse Practitioner

## 2017-03-19 NOTE — Telephone Encounter (Signed)
I called patient to f/u on lab results, appears her anemia is due to iron deficiency. She was out; I spoke to her husband to inform him she should begin oral iron supplementation 1 tablet twice per day, take with vitamin c/orange juice for better absorption. Reviewed possible side effects of iron, including constipation she may require laxative or stool softener. I will send schedule message to check labs in 2 and 4 weeks, f/u in 4 weeks to assess response to therapy and determine if she needs IV iron. He verbalizes understanding. I encouraged him to have patient call if she has questions or concerns.

## 2017-03-22 ENCOUNTER — Telehealth: Payer: Self-pay | Admitting: Hematology

## 2017-03-22 NOTE — Telephone Encounter (Signed)
Scheduled appts per 1/25 sch msg - left voicemail for patient regarding appts.

## 2017-03-25 ENCOUNTER — Telehealth: Payer: Self-pay | Admitting: Internal Medicine

## 2017-03-25 NOTE — Telephone Encounter (Signed)
Pt called and would like a Rx for her cough. Is having a hard time breathing because of her asthma. Please advise on Rx, please send to [facility]

## 2017-03-26 ENCOUNTER — Encounter: Payer: Self-pay | Admitting: Internal Medicine

## 2017-03-26 NOTE — Telephone Encounter (Signed)
CRM for notification. See Telephone encounter for:   03/26/17.  Caller name:Nay,James Relation to pt: spouse  Call back number: [phone]  Pharmacy: [id number] - [place], [place] [phone] (Phone) [phone] (Fax)    Reason for call:  Spouse states patient returned from [place] on Wednesday experiencing cough, congestion and feeling weak, offered appointment spouse declined stating patient is too weak, seeking Rx, please advise?

## 2017-03-26 NOTE — Telephone Encounter (Signed)
error:[id number] ° °

## 2017-03-26 NOTE — Telephone Encounter (Signed)
Please advise 

## 2017-03-28 MED ORDER — PROMETHAZINE-CODEINE 6.25-10 MG/5ML PO SYRP: 5.0000 mL | ORAL_SOLUTION | ORAL | 0 refills | Status: DC | PRN

## 2017-03-28 NOTE — Addendum Note (Signed)
Addended by: Cassandria Anger on: 03/28/2017 08:48 PM   Modules accepted: Orders

## 2017-03-28 NOTE — Telephone Encounter (Signed)
Agree with the need for an appointment if sick.  I will email a prescription for promethazine and codeine cough syrup.  Thank you

## 2017-04-02 ENCOUNTER — Telehealth: Payer: Self-pay | Admitting: Nurse Practitioner

## 2017-04-02 ENCOUNTER — Other Ambulatory Visit: Payer: Self-pay | Admitting: Nurse Practitioner

## 2017-04-02 ENCOUNTER — Inpatient Hospital Stay: Payer: Medicare Other | Attending: Hematology

## 2017-04-02 DIAGNOSIS — D649 Anemia, unspecified: Secondary | ICD-10-CM | POA: Diagnosis not present

## 2017-04-02 DIAGNOSIS — D508 Other iron deficiency anemias: Secondary | ICD-10-CM

## 2017-04-02 LAB — CBC WITH DIFFERENTIAL (CANCER CENTER ONLY)
Basophils Absolute: 0 10*3/uL (ref 0.0–0.1)
Basophils Relative: 0 %
Eosinophils Absolute: 0.2 10*3/uL (ref 0.0–0.5)
Eosinophils Relative: 2 %
HCT: 34.2 % — ABNORMAL LOW (ref 34.8–46.6)
Hemoglobin: 10.3 g/dL — ABNORMAL LOW (ref 11.6–15.9)
Lymphocytes Relative: 28 %
Lymphs Abs: 3.4 10*3/uL — ABNORMAL HIGH (ref 0.9–3.3)
MCH: 27.1 pg (ref 25.1–34.0)
MCHC: 30.1 g/dL — ABNORMAL LOW (ref 31.5–36.0)
MCV: 90 fL (ref 79.5–101.0)
Monocytes Absolute: 1.1 10*3/uL — ABNORMAL HIGH (ref 0.1–0.9)
Monocytes Relative: 9 %
Neutro Abs: 7.4 10*3/uL — ABNORMAL HIGH (ref 1.5–6.5)
Neutrophils Relative %: 61 %
Platelet Count: 443 10*3/uL — ABNORMAL HIGH (ref 145–400)
RBC: 3.8 MIL/uL (ref 3.70–5.45)
RDW: 22.6 % — ABNORMAL HIGH (ref 11.2–14.5)
WBC Count: 12.2 10*3/uL — ABNORMAL HIGH (ref 3.9–10.3)

## 2017-04-02 NOTE — Telephone Encounter (Signed)
Called patient to discuss lab results. She developed a cold recently but otherwise has no concerns. CBC only drawn today without iron studies; however Hgb increased from 9.2 to 10.3. She began oral iron supplement 1/25 per my instruction but has only been taking 1 per day. She is tolerating it well, no constipation. I recommend she increase to BID, she will begin increased dose tonight. Will recheck labs 2/22 including iron studies. I verified orders are placed today. Will follow.

## 2017-04-06 ENCOUNTER — Other Ambulatory Visit: Payer: Self-pay

## 2017-04-06 MED ORDER — DEXLANSOPRAZOLE 60 MG PO CPDR: 60.0000 mg | DELAYED_RELEASE_CAPSULE | Freq: Every day | ORAL | 3 refills | Status: DC

## 2017-04-16 ENCOUNTER — Other Ambulatory Visit: Payer: Medicare Other

## 2017-04-20 ENCOUNTER — Telehealth: Payer: Self-pay | Admitting: Hematology

## 2017-04-20 NOTE — Telephone Encounter (Signed)
Called patient to schedule appt per 2/26 sch msg - patient wants me to call back regarding appt.

## 2017-04-20 NOTE — Telephone Encounter (Signed)
Spoke with patient regarding appt that was added per 2/26 sch msg.

## 2017-04-21 ENCOUNTER — Inpatient Hospital Stay: Payer: Medicare Other

## 2017-04-21 DIAGNOSIS — D508 Other iron deficiency anemias: Secondary | ICD-10-CM

## 2017-04-21 DIAGNOSIS — D649 Anemia, unspecified: Secondary | ICD-10-CM

## 2017-04-21 LAB — RETICULOCYTES
RBC.: 3.66 MIL/uL — AB (ref 3.70–5.45)
RETIC COUNT ABSOLUTE: 87.8 10*3/uL (ref 33.7–90.7)
Retic Ct Pct: 2.4 % — ABNORMAL HIGH (ref 0.7–2.1)

## 2017-04-21 LAB — CBC WITH DIFFERENTIAL (CANCER CENTER ONLY)
BASOS ABS: 0 10*3/uL (ref 0.0–0.1)
BASOS PCT: 0 %
Eosinophils Absolute: 0.5 10*3/uL (ref 0.0–0.5)
Eosinophils Relative: 4 %
HEMATOCRIT: 33.4 % — AB (ref 34.8–46.6)
HEMOGLOBIN: 10 g/dL — AB (ref 11.6–15.9)
Lymphocytes Relative: 31 %
Lymphs Abs: 3.4 10*3/uL — ABNORMAL HIGH (ref 0.9–3.3)
MCH: 27.3 pg (ref 25.1–34.0)
MCHC: 29.9 g/dL — ABNORMAL LOW (ref 31.5–36.0)
MCV: 91.3 fL (ref 79.5–101.0)
Monocytes Absolute: 1.1 10*3/uL — ABNORMAL HIGH (ref 0.1–0.9)
Monocytes Relative: 10 %
NEUTROS ABS: 6.1 10*3/uL (ref 1.5–6.5)
NEUTROS PCT: 55 %
Platelet Count: 540 10*3/uL — ABNORMAL HIGH (ref 145–400)
RBC: 3.66 MIL/uL — ABNORMAL LOW (ref 3.70–5.45)
RDW: 21.9 % — AB (ref 11.2–14.5)
WBC Count: 11.1 10*3/uL — ABNORMAL HIGH (ref 3.9–10.3)

## 2017-04-21 LAB — IRON AND TIBC
Iron: 171 ug/dL — ABNORMAL HIGH (ref 41–142)
Saturation Ratios: 45 % (ref 21–57)
TIBC: 378 ug/dL (ref 236–444)
UIBC: 206 ug/dL

## 2017-04-21 LAB — FERRITIN: Ferritin: 9 ng/mL (ref 9–269)

## 2017-04-23 ENCOUNTER — Other Ambulatory Visit: Payer: Self-pay | Admitting: Hematology

## 2017-04-23 ENCOUNTER — Telehealth: Payer: Self-pay | Admitting: Hematology

## 2017-04-23 DIAGNOSIS — D509 Iron deficiency anemia, unspecified: Secondary | ICD-10-CM | POA: Insufficient documentation

## 2017-04-23 DIAGNOSIS — D500 Iron deficiency anemia secondary to blood loss (chronic): Secondary | ICD-10-CM

## 2017-04-23 HISTORY — DX: Iron deficiency anemia, unspecified: D50.9

## 2017-04-23 NOTE — Telephone Encounter (Signed)
I called patient back and reviewed her lab results.  Her anemia has improved after she stopped taking oral iron about 6 weeks ago, however her hemoglobin still around 10, ferritin still remains to be low at 9.  I recommend her to try IV Feraheme, potential benefits and side effects, especially anaphylactic allergy reaction, discussed with patient.  He agrees to proceed.  I will schedule her to start in the next few weeks.  I will see her back on 3/19.  Truitt Merle 04/23/2017

## 2017-04-26 ENCOUNTER — Telehealth: Payer: Self-pay | Admitting: Hematology

## 2017-04-26 NOTE — Telephone Encounter (Signed)
Appointments scheduled per 3/1 sch msg/ Patient notified  Of this appointment also.

## 2017-05-03 ENCOUNTER — Inpatient Hospital Stay: Payer: Medicare Other | Attending: Hematology

## 2017-05-03 VITALS — BP 156/84 | HR 79 | Temp 98.3°F | Resp 16

## 2017-05-03 DIAGNOSIS — D464 Refractory anemia, unspecified: Secondary | ICD-10-CM | POA: Diagnosis not present

## 2017-05-03 DIAGNOSIS — D500 Iron deficiency anemia secondary to blood loss (chronic): Secondary | ICD-10-CM

## 2017-05-03 DIAGNOSIS — R7989 Other specified abnormal findings of blood chemistry: Secondary | ICD-10-CM | POA: Diagnosis not present

## 2017-05-03 DIAGNOSIS — Z9081 Acquired absence of spleen: Secondary | ICD-10-CM | POA: Diagnosis not present

## 2017-05-03 DIAGNOSIS — D509 Iron deficiency anemia, unspecified: Secondary | ICD-10-CM | POA: Insufficient documentation

## 2017-05-03 MED ORDER — SODIUM CHLORIDE 0.9 % IV SOLN
510.0000 mg | Freq: Once | INTRAVENOUS | Status: AC
  Administered 2017-05-03: 510 mg via INTRAVENOUS
  Filled 2017-05-03: qty 17

## 2017-05-03 MED ORDER — SODIUM CHLORIDE 0.9 % IV SOLN
Freq: Once | INTRAVENOUS | Status: AC
  Administered 2017-05-03: 09:00:00 via INTRAVENOUS

## 2017-05-03 NOTE — Patient Instructions (Signed)

## 2017-05-10 ENCOUNTER — Ambulatory Visit: Payer: Medicare Other

## 2017-05-11 ENCOUNTER — Inpatient Hospital Stay: Payer: Medicare Other

## 2017-05-11 ENCOUNTER — Inpatient Hospital Stay (HOSPITAL_BASED_OUTPATIENT_CLINIC_OR_DEPARTMENT_OTHER): Payer: Medicare Other | Admitting: Nurse Practitioner

## 2017-05-11 ENCOUNTER — Encounter: Payer: Self-pay | Admitting: Nurse Practitioner

## 2017-05-11 VITALS — BP 159/77 | HR 81 | Temp 97.9°F | Resp 18 | Ht 61.0 in | Wt 180.2 lb

## 2017-05-11 VITALS — BP 146/79 | HR 78 | Temp 98.2°F | Resp 18

## 2017-05-11 DIAGNOSIS — D649 Anemia, unspecified: Secondary | ICD-10-CM

## 2017-05-11 DIAGNOSIS — Z9081 Acquired absence of spleen: Secondary | ICD-10-CM

## 2017-05-11 DIAGNOSIS — D509 Iron deficiency anemia, unspecified: Secondary | ICD-10-CM | POA: Diagnosis not present

## 2017-05-11 DIAGNOSIS — R7989 Other specified abnormal findings of blood chemistry: Secondary | ICD-10-CM | POA: Diagnosis not present

## 2017-05-11 DIAGNOSIS — D508 Other iron deficiency anemias: Secondary | ICD-10-CM

## 2017-05-11 DIAGNOSIS — D75838 Other thrombocytosis: Secondary | ICD-10-CM

## 2017-05-11 DIAGNOSIS — D500 Iron deficiency anemia secondary to blood loss (chronic): Secondary | ICD-10-CM

## 2017-05-11 LAB — CBC WITH DIFFERENTIAL (CANCER CENTER ONLY)
BASOS PCT: 1 %
Basophils Absolute: 0.1 10*3/uL (ref 0.0–0.1)
EOS ABS: 0.5 10*3/uL (ref 0.0–0.5)
Eosinophils Relative: 6 %
HEMATOCRIT: 33.3 % — AB (ref 34.8–46.6)
Hemoglobin: 10.4 g/dL — ABNORMAL LOW (ref 11.6–15.9)
Lymphocytes Relative: 34 %
Lymphs Abs: 2.8 10*3/uL (ref 0.9–3.3)
MCH: 28.3 pg (ref 25.1–34.0)
MCHC: 31.2 g/dL — AB (ref 31.5–36.0)
MCV: 90.5 fL (ref 79.5–101.0)
MONO ABS: 0.6 10*3/uL (ref 0.1–0.9)
MONOS PCT: 8 %
NEUTROS ABS: 4.3 10*3/uL (ref 1.5–6.5)
NEUTROS PCT: 51 %
Platelet Count: 523 10*3/uL — ABNORMAL HIGH (ref 145–400)
RBC: 3.68 MIL/uL — AB (ref 3.70–5.45)
RDW: 21.4 % — AB (ref 11.2–14.5)
WBC: 8.2 10*3/uL (ref 3.9–10.3)

## 2017-05-11 LAB — FERRITIN: FERRITIN: 515 ng/mL — AB (ref 9–269)

## 2017-05-11 LAB — IRON AND TIBC
IRON: 72 ug/dL (ref 41–142)
Saturation Ratios: 22 % (ref 21–57)
TIBC: 323 ug/dL (ref 236–444)
UIBC: 250 ug/dL

## 2017-05-11 LAB — RETICULOCYTES
RBC.: 3.68 MIL/uL — AB (ref 3.70–5.45)
Retic Count, Absolute: 88.3 10*3/uL (ref 33.7–90.7)
Retic Ct Pct: 2.4 % — ABNORMAL HIGH (ref 0.7–2.1)

## 2017-05-11 MED ORDER — SODIUM CHLORIDE 0.9 % IV SOLN
510.0000 mg | Freq: Once | INTRAVENOUS | Status: AC
  Administered 2017-05-11: 510 mg via INTRAVENOUS
  Filled 2017-05-11: qty 17

## 2017-05-11 MED ORDER — SODIUM CHLORIDE 0.9 % IV SOLN
Freq: Once | INTRAVENOUS | Status: AC
  Administered 2017-05-11: 16:00:00 via INTRAVENOUS

## 2017-05-11 NOTE — Patient Instructions (Signed)

## 2017-05-11 NOTE — Progress Notes (Signed)
[facility]  [phone] [phone]  Clinic Follow up Note   Patient Care Team: Plotnikov, Evie Lacks, MD as PCP - General Magda Bernheim, MD as Referring Physician (Orthopedic Surgery) Irene Shipper, MD (Gastroenterology) Eustace Moore, MD as Consulting Physician (Neurosurgery) Clydell Hakim, MD as Consulting Physician (Pain Medicine) Truitt Merle, MD as Consulting Physician (Hematology) Alla Feeling, NP as Nurse Practitioner (Nurse Practitioner)  Date of Service: 05/11/17  DIAGNOSIS: Iron deficiency anemia, thrombocytosis  CURRENT THERAPY: began oral iron supplement 1 tab daily on 03/19/17, increased to 1 tab BID on 04/02/17; s/p IV Feraheme 05/03/17, 05/11/17  INTERVAL HISTORY: Ms. Matsunaga returns for follow up as scheduled. She recently went to [facility] to see family/friends and she picked up a febrile illness. Took her about 3 weeks but finally feels like she recovered. Has lingering occasionally productive cough which is improved with cough syrup per PCP. Denies recent fever. Fatigue is somewhat improved. Having normal BM daily. Denies bleeding. Appetite is normal. No nausea or vomiting. Has occasional dyspnea related to her asthma, which seems increased with recent allergies and seasonal change. Reports intermittent lightheadedness which is improved. Chronic back pain unchanged. Activity is limited due to back pain. Tolerating oral iron supplement without difficulty. Denies new symptoms or concerns.    MEDICAL HISTORY:  Past Medical History:  Diagnosis Date  . Anxiety   . Asthma   . Depression   . Diverticulosis of colon (without mention of hemorrhage)   . Esophageal stricture   . GERD (gastroesophageal reflux disease)   . Heart murmur   . Hypertension   . Internal hemorrhoids without mention of complication   . Osteoarthritis   . Shortness of breath dyspnea    with allergies    SURGICAL HISTORY: Past Surgical History:  Procedure  Laterality Date  . ABDOMINAL HYSTERECTOMY    . BREAST CYST EXCISION Left 1980's  . LUMBAR LAMINECTOMY/DECOMPRESSION MICRODISCECTOMY Bilateral 02/20/2014   Procedure: LUMBAR TWO THREE, LUMBAR THREE FOUR LUMBAR LAMINECTOMY/DECOMPRESSION MICRODISCECTOMY 2 LEVELS;  Surgeon: Eustace Moore, MD;  Location: [facility];  Service: Neurosurgery;  Laterality: Bilateral;  . SPLENECTOMY    . TIBIA FRACTURE SURGERY Right   . TIBIA FRACTURE SURGERY Left   . TOTAL KNEE ARTHROPLASTY Right 2013    I have reviewed the social history and family history with the patient and they are unchanged from previous note.  ALLERGIES:  is allergic to ace inhibitors; aspirin; and citalopram hydrobromide.  MEDICATIONS:  Current Outpatient Medications  Medication Sig Dispense Refill  . albuterol (PROVENTIL HFA;VENTOLIN HFA) 108 (90 Base) MCG/ACT inhaler Inhale 2 puffs into the lungs every 6 (six) hours as needed for wheezing or shortness of breath. 1 Inhaler 6  . ALPRAZolam (XANAX) 0.5 MG tablet Take 1 tablet (0.5 mg total) by mouth 2 (two) times daily as needed. (Patient taking differently: Take 0.5 mg by mouth 2 (two) times daily as needed for anxiety. ) 180 tablet 0  . cetirizine (ZYRTEC) 10 MG tablet Take 10 mg by mouth daily.      . Cholecalciferol (VITAMIN D3) 1000 UNITS tablet Take 2 tablets by mouth once daily     . dexlansoprazole (DEXILANT) 60 MG capsule Take 1 capsule (60 mg total) by mouth daily. 90 capsule 3  . diclofenac sodium (VOLTAREN) 1 % GEL Apply 4 g topically 4 (four) times daily. As directed. (Patient taking differently: Apply 4 g topically 4 (four) times daily as needed (for pain resulting from spinal stenosis/arthritis.). )  200 g 3  . fluticasone furoate-vilanterol (BREO ELLIPTA) 100-25 MCG/INH AEPB Inhale 1 puff into the lungs daily.    [name] HYDROcodone-acetaminophen (NORCO) 10-325 MG tablet Take 1 tablet by mouth every 6 (six) hours as needed. (Patient taking differently: Take 1 tablet by mouth every 6  (six) hours as needed (for pain.). ) 120 tablet 0  . methocarbamol (ROBAXIN) 750 MG tablet Take 1 tablet (750 mg total) by mouth daily as needed for muscle spasms. 30 tablet 3  . montelukast (SINGULAIR) 10 MG tablet Take 1 tablet (10 mg total) by mouth daily. 90 tablet 1  . Omega-3 Fatty Acids (FISH OIL) 1000 MG CAPS Take 1 capsule by mouth daily.    [name] oxyCODONE (OXY IR/ROXICODONE) 5 MG immediate release tablet     . promethazine-codeine (PHENERGAN WITH CODEINE) 6.25-10 MG/5ML syrup Take 5 mLs by mouth every 4 (four) hours as needed. 300 mL 0  . venlafaxine XR (EFFEXOR XR) 150 MG 24 hr capsule Take 1 capsule (150 mg total) by mouth daily with breakfast. 30 capsule 11  . verapamil (VERELAN PM) 180 MG 24 hr capsule Take 1 capsule (180 mg total) by mouth at bedtime. No further re-fills without an appointment. 30 capsule 11   No current facility-administered medications for this visit.     PHYSICAL EXAMINATION: ECOG PERFORMANCE STATUS: 2 - Symptomatic, <50% confined to bed  Vitals:   05/11/17 1510  BP: (!) 159/77  Pulse: 81  Resp: 18  Temp: 97.9 F (36.6 C)  SpO2: 99%   Filed Weights   05/11/17 1510  Weight: 180 lb 3.2 oz (81.7 kg)    GENERAL:alert, no distress and comfortable. Presents in wheelchair  SKIN: skin color, texture, turgor are normal, no rashes or significant lesions EYES: normal, Conjunctiva are pink and non-injected, sclera clear OROPHARYNX:no exudate, no erythema and lips, buccal mucosa, and tongue normal  LYMPH:  no palpable cervical or supraclavicular lymphadenopathy LUNGS: clear to auscultation bilaterally with normal breathing effort HEART: regular rate & rhythm (+) bilateral lower extremity edema ABDOMEN:abdomen soft, non-tender and normal bowel sounds Musculoskeletal:no cyanosis of digits and no clubbing  NEURO: alert & oriented x 3 with fluent speech, no focal motor/sensory deficits  LABORATORY DATA:  I have reviewed the data as listed CBC Latest Ref Rng &  Units 05/11/2017 04/21/2017 04/02/2017  WBC 3.9 - 10.3 K/uL 8.2 11.1(H) 12.2(H)  Hemoglobin 12.0 - 15.0 g/dL - - -  Hematocrit 34.8 - 46.6 % 33.3(L) 33.4(L) 34.2(L)  Platelets 145 - 400 K/uL 523(H) 540(H) 443(H)     CMP Latest Ref Rng & Units 02/12/2017 10/09/2016 03/17/2016  Glucose 70 - 99 mg/dL 89 113(H) 91  BUN 6 - 23 mg/dL 12 11 13   Creatinine 0.40 - 1.20 mg/dL 0.82 0.90 1.28(H)  Sodium 135 - 145 mEq/L 140 142 137  Potassium 3.5 - 5.1 mEq/L 4.5 4.0 4.2  Chloride 96 - 112 mEq/L 101 104 101  CO2 19 - 32 mEq/L 35(H) 31 33(H)  Calcium 8.4 - 10.5 mg/dL 9.0 9.0 9.0  Total Protein 6.0 - 8.3 g/dL - 7.3 8.0  Total Bilirubin 0.2 - 1.2 mg/dL - 0.3 0.3  Alkaline Phos 39 - 117 U/L - 72 75  AST 0 - 37 U/L - 11 12  ALT 0 - 35 U/L - 6 6      RADIOGRAPHIC STUDIES: I have personally reviewed the radiological images as listed and agreed with the findings in the report. No results found.   ASSESSMENT & PLAN: 72  year old female with past medical history of HTN, asthma, OA, anxiety, and depression found to have abnormal CBC in 2016 with recent worsening anemia  1. Anemia, iron deficiency -Ms. Whirley appears stable. She began oral iron supplement 1 tab daily on 03/19/17, iron studies did not significantly improve, increased to 1 tab BID on 04/02/17. Hgb stable, and ferritin remained low although serum iron did improve some.  -S/p IV Feraheme on 05/03/17. Today her ferritin improved drastically now 515, iron studies in normal range. However her Hgb remains stable 10.4. Will follow closely to see if improves after second dose IV Feraheme today -Tolerating oral iron BID, continue   2. Anemia, thrombocytosis, intermittent leukocytosis  -Her anemia has not improved much with correction of iron deficiency, Hgb remains around 10; she continues to have thrombocytosis, Plt 523 today. S/p splenectomy. Platelets were normal for period of time from 2011 to 2013 but have been consistently elevated since then.  -WBC  and differential normal today, but was mildly elevated 3 weeks ago, which may be partly related to recent acute illness.  -Today's smear with moderate target, few teardrop, ovalocytes, and shistocytes; I reviewed and discussed with Dr. Burr Medico; findings suspicious for MPN; she recommends bone marrow aspirate and biopsy for diagnosis. I reviewed indication and possible procedure risks with the patient, she agrees to proceed but would like to discuss with Dr. Burr Medico directly. MD to call patient.   2. HTN, asthma -Continue f/u with PCP Dr. Alain Marion  PLAN: -IV Feraheme today -continue oral iron  -Bone marrow aspirate and biopsy in 1 week -f/u pending results   All questions were answered. The patient knows to call the clinic with any problems, questions or concerns. No barriers to learning was detected. I spent 20 counseling the patient face to face. The total time spent in the appointment was 25 minutes and more than 50% was on counseling and review of test results     Alla Feeling, NP 05/12/17

## 2017-05-12 ENCOUNTER — Telehealth: Payer: Self-pay | Admitting: Hematology

## 2017-05-12 ENCOUNTER — Telehealth: Payer: Self-pay | Admitting: Adult Health

## 2017-05-12 ENCOUNTER — Encounter: Payer: Self-pay | Admitting: Nurse Practitioner

## 2017-05-12 NOTE — Telephone Encounter (Signed)
Scheduled appt per 3/20 sch msg - spoke with patient and confirmed appt.

## 2017-05-12 NOTE — Telephone Encounter (Signed)
Call made to flow to put patient on schedule on 3/27 at 8 am for bone marrow biopsy and aspirate.  Time reserved.  Wilber Bihari, NP

## 2017-05-12 NOTE — Telephone Encounter (Signed)
I called pt and discussed her lab findings from yesterday which was concerning for MPN. I recommend a bone marrow biopsy, the potential side effects discussed with her, she agrees. She is scheduled for next Wednesday, I will see her back in a month.  Truitt Merle  05/12/2017

## 2017-05-14 ENCOUNTER — Ambulatory Visit (INDEPENDENT_AMBULATORY_CARE_PROVIDER_SITE_OTHER): Payer: Medicare Other | Admitting: Internal Medicine

## 2017-05-14 ENCOUNTER — Encounter: Payer: Self-pay | Admitting: Internal Medicine

## 2017-05-14 DIAGNOSIS — G8929 Other chronic pain: Secondary | ICD-10-CM

## 2017-05-14 DIAGNOSIS — G250 Essential tremor: Secondary | ICD-10-CM

## 2017-05-14 DIAGNOSIS — F4323 Adjustment disorder with mixed anxiety and depressed mood: Secondary | ICD-10-CM | POA: Diagnosis not present

## 2017-05-14 DIAGNOSIS — R32 Unspecified urinary incontinence: Secondary | ICD-10-CM

## 2017-05-14 DIAGNOSIS — M5440 Lumbago with sciatica, unspecified side: Secondary | ICD-10-CM | POA: Diagnosis not present

## 2017-05-14 DIAGNOSIS — I10 Essential (primary) hypertension: Secondary | ICD-10-CM

## 2017-05-14 MED ORDER — PRIMIDONE 50 MG PO TABS: 50.0000 mg | ORAL_TABLET | Freq: Three times a day (TID) | ORAL | 5 refills | Status: DC

## 2017-05-14 NOTE — Assessment & Plan Note (Signed)
You can try CBD oil, Turmeric capsules for pain --- [facility]

## 2017-05-14 NOTE — Patient Instructions (Addendum)
You can try CBD oil, Turmeric capsules for pain --- [facility]

## 2017-05-14 NOTE — Assessment & Plan Note (Signed)
Effexor XR 

## 2017-05-14 NOTE — Assessment & Plan Note (Signed)
Verapamil 

## 2017-05-14 NOTE — Progress Notes (Signed)
Subjective:  Patient ID: Mallory Klein, female    DOB: Mar 19, 1945  Age: 72 y.o. MRN: [id number]  CC: No chief complaint on file.   HPI Mallory Klein presents for anemia, chronic LBP, anxiety, depression f/u. C/o R >L hand tremor  Outpatient Medications Prior to Visit  Medication Sig Dispense Refill  . albuterol (PROVENTIL HFA;VENTOLIN HFA) 108 (90 Base) MCG/ACT inhaler Inhale 2 puffs into the lungs every 6 (six) hours as needed for wheezing or shortness of breath. 1 Inhaler 6  . ALPRAZolam (XANAX) 0.5 MG tablet Take 1 tablet (0.5 mg total) by mouth 2 (two) times daily as needed. (Patient taking differently: Take 0.5 mg by mouth 2 (two) times daily as needed for anxiety. ) 180 tablet 0  . cetirizine (ZYRTEC) 10 MG tablet Take 10 mg by mouth daily.      . Cholecalciferol (VITAMIN D3) 1000 UNITS tablet Take 2 tablets by mouth once daily     . dexlansoprazole (DEXILANT) 60 MG capsule Take 1 capsule (60 mg total) by mouth daily. 90 capsule 3  . diclofenac sodium (VOLTAREN) 1 % GEL Apply 4 g topically 4 (four) times daily. As directed. (Patient taking differently: Apply 4 g topically 4 (four) times daily as needed (for pain resulting from spinal stenosis/arthritis.). ) 200 g 3  . fluticasone furoate-vilanterol (BREO ELLIPTA) 100-25 MCG/INH AEPB Inhale 1 puff into the lungs daily.    [name] HYDROcodone-acetaminophen (NORCO) 10-325 MG tablet Take 1 tablet by mouth every 6 (six) hours as needed. (Patient taking differently: Take 1 tablet by mouth every 6 (six) hours as needed (for pain.). ) 120 tablet 0  . methocarbamol (ROBAXIN) 750 MG tablet Take 1 tablet (750 mg total) by mouth daily as needed for muscle spasms. 30 tablet 3  . montelukast (SINGULAIR) 10 MG tablet Take 1 tablet (10 mg total) by mouth daily. 90 tablet 1  . Omega-3 Fatty Acids (FISH OIL) 1000 MG CAPS Take 1 capsule by mouth daily.    [name] oxyCODONE (OXY IR/ROXICODONE) 5 MG immediate release tablet     . promethazine-codeine  (PHENERGAN WITH CODEINE) 6.25-10 MG/5ML syrup Take 5 mLs by mouth every 4 (four) hours as needed. 300 mL 0  . venlafaxine XR (EFFEXOR XR) 150 MG 24 hr capsule Take 1 capsule (150 mg total) by mouth daily with breakfast. 30 capsule 11  . verapamil (VERELAN PM) 180 MG 24 hr capsule Take 1 capsule (180 mg total) by mouth at bedtime. No further re-fills without an appointment. 30 capsule 11   No facility-administered medications prior to visit.     ROS Review of Systems  Constitutional: Positive for fatigue. Negative for activity change, appetite change, chills and unexpected weight change.  HENT: Negative for congestion, mouth sores and sinus pressure.   Eyes: Negative for visual disturbance.  Respiratory: Negative for cough and chest tightness.   Gastrointestinal: Negative for abdominal pain and nausea.  Genitourinary: Positive for difficulty urinating, frequency and urgency. Negative for vaginal pain.  Musculoskeletal: Positive for arthralgias, back pain and gait problem.  Skin: Negative for pallor and rash.  Neurological: Negative for dizziness, tremors, weakness, numbness and headaches.  Psychiatric/Behavioral: Negative for confusion and sleep disturbance.    Objective:  BP (!) 142/82 (BP Location: Left Arm, Patient Position: Sitting, Cuff Size: Large)   Pulse 98   Temp 98.6 F (37 C) (Oral)   Ht 5\' 1"  (1.549 m)   Wt 179 lb (81.2 kg)   SpO2 99%   BMI 33.82 kg/m  BP Readings from Last 3 Encounters:  05/14/17 (!) 142/82  05/11/17 (!) 146/79  05/11/17 (!) 159/77    Wt Readings from Last 3 Encounters:  05/14/17 179 lb (81.2 kg)  05/11/17 180 lb 3.2 oz (81.7 kg)  02/12/17 186 lb 4 oz (84.5 kg)    Physical Exam  Constitutional: She appears well-developed. No distress.  HENT:  Head: Normocephalic.  Right Ear: External ear normal.  Left Ear: External ear normal.  Nose: Nose normal.  Mouth/Throat: Oropharynx is clear and moist.  Eyes: Pupils are equal, round, and reactive  to light. Conjunctivae are normal. Right eye exhibits no discharge. Left eye exhibits no discharge.  Neck: Normal range of motion. Neck supple. No JVD present. No tracheal deviation present. No thyromegaly present.  Cardiovascular: Normal rate, regular rhythm and normal heart sounds.  Pulmonary/Chest: No stridor. No respiratory distress. She has no wheezes.  Abdominal: Soft. Bowel sounds are normal. She exhibits no distension and no mass. There is no tenderness. There is no rebound and no guarding.  Musculoskeletal: She exhibits tenderness. She exhibits no edema.  Lymphadenopathy:    She has no cervical adenopathy.  Neurological: She displays normal reflexes. No cranial nerve deficit. She exhibits normal muscle tone. Coordination abnormal.  Skin: No rash noted. No erythema.  Psychiatric: She has a normal mood and affect. Her behavior is normal. Judgment and thought content normal.  in a w/c Hand tremor  Lab Results  Component Value Date   WBC 8.2 05/11/2017   HGB 9.1 (L) 02/12/2017   HCT 33.3 (L) 05/11/2017   PLT 523 (H) 05/11/2017   GLUCOSE 89 02/12/2017   CHOL 219 (H) 02/05/2015   TRIG 69.0 02/05/2015   HDL 73.70 02/05/2015   LDLDIRECT 109.8 02/10/2010   LDLCALC 132 (H) 02/05/2015   ALT 6 10/09/2016   AST 11 10/09/2016   NA 140 02/12/2017   K 4.5 02/12/2017   CL 101 02/12/2017   CREATININE 0.82 02/12/2017   BUN 12 02/12/2017   CO2 35 (H) 02/12/2017   TSH 0.82 02/12/2017   INR 0.97 02/07/2014   HGBA1C 6.2 02/12/2017    Dg Chest 2 View  Result Date: 05/13/2016 CLINICAL DATA:  Intermittent shortness of Breath. EXAM: CHEST  2 VIEW COMPARISON:  09/22/2015 FINDINGS: Heart is borderline in size. Lingular scarring, stable. Right lung is clear. No effusions or acute bony abnormality. IMPRESSION: Borderline cardiomegaly.  Lingular scarring.  No active disease. Electronically Signed   By: Mallory Klein M.D.   On: 05/13/2016 08:06    Assessment & Plan:   There are no diagnoses  linked to this encounter. I am having Mallory Klein maintain her cholecalciferol, cetirizine, diclofenac sodium, Fish Oil, ALPRAZolam, HYDROcodone-acetaminophen, methocarbamol, oxyCODONE, verapamil, venlafaxine XR, fluticasone furoate-vilanterol, albuterol, montelukast, promethazine-codeine, and dexlansoprazole.  No orders of the defined types were placed in this encounter.    Follow-up: No follow-ups on file.  Walker Kehr, MD

## 2017-05-14 NOTE — Assessment & Plan Note (Signed)
Myrbetriq

## 2017-05-14 NOTE — Assessment & Plan Note (Signed)
Trial of Primidone

## 2017-05-17 ENCOUNTER — Telehealth: Payer: Self-pay | Admitting: Internal Medicine

## 2017-05-17 NOTE — Telephone Encounter (Signed)
Copied from [place] [id number]. Topic: Quick Communication - See Telephone Encounter >> May 17, 2017  2:45 PM Hewitt Shorts wrote: CRM for notification. See Telephone encounter for: 05/17/17.pt wants to confirm that the primidone is supposed to be taken 3 times a day and is concerned regarding the  Interaction of this and venlafaxine 150mg   Best number

## 2017-05-18 NOTE — Telephone Encounter (Signed)
Called pt and affirmed that pt is to take primidone three time a day. Looked up any interaction between primidone and venlafaxine and per [employer] no interactions seen.

## 2017-05-19 ENCOUNTER — Inpatient Hospital Stay (HOSPITAL_BASED_OUTPATIENT_CLINIC_OR_DEPARTMENT_OTHER): Payer: Medicare Other | Admitting: Adult Health

## 2017-05-19 ENCOUNTER — Inpatient Hospital Stay: Payer: Medicare Other

## 2017-05-19 DIAGNOSIS — D464 Refractory anemia, unspecified: Secondary | ICD-10-CM | POA: Diagnosis not present

## 2017-05-19 DIAGNOSIS — D649 Anemia, unspecified: Secondary | ICD-10-CM

## 2017-05-19 DIAGNOSIS — D508 Other iron deficiency anemias: Secondary | ICD-10-CM

## 2017-05-19 DIAGNOSIS — D509 Iron deficiency anemia, unspecified: Secondary | ICD-10-CM | POA: Diagnosis not present

## 2017-05-19 LAB — FERRITIN: Ferritin: 575 ng/mL — ABNORMAL HIGH (ref 9–269)

## 2017-05-19 LAB — CBC WITH DIFFERENTIAL (CANCER CENTER ONLY)
Basophils Absolute: 0 10*3/uL (ref 0.0–0.1)
Basophils Relative: 0 %
Eosinophils Absolute: 0.5 10*3/uL (ref 0.0–0.5)
Eosinophils Relative: 4 %
HCT: 35.4 % (ref 34.8–46.6)
HEMOGLOBIN: 10.9 g/dL — AB (ref 11.6–15.9)
LYMPHS ABS: 3.5 10*3/uL — AB (ref 0.9–3.3)
LYMPHS PCT: 27 %
MCH: 28.8 pg (ref 25.1–34.0)
MCHC: 30.8 g/dL — ABNORMAL LOW (ref 31.5–36.0)
MCV: 93.4 fL (ref 79.5–101.0)
Monocytes Absolute: 1.1 10*3/uL — ABNORMAL HIGH (ref 0.1–0.9)
Monocytes Relative: 9 %
NEUTROS ABS: 7.6 10*3/uL — AB (ref 1.5–6.5)
NEUTROS PCT: 60 %
Platelet Count: 386 10*3/uL (ref 145–400)
RBC: 3.79 MIL/uL (ref 3.70–5.45)
RDW: 21.6 % — ABNORMAL HIGH (ref 11.2–14.5)
WBC Count: 12.7 10*3/uL — ABNORMAL HIGH (ref 3.9–10.3)

## 2017-05-19 LAB — IRON AND TIBC
Iron: 59 ug/dL (ref 41–142)
SATURATION RATIOS: 22 % (ref 21–57)
TIBC: 266 ug/dL (ref 236–444)
UIBC: 208 ug/dL

## 2017-05-19 MED ORDER — LIDOCAINE HCL 2 % IJ SOLN
INTRAMUSCULAR | Status: AC
  Filled 2017-05-19: qty 20

## 2017-05-19 NOTE — Progress Notes (Signed)
INDICATION: refractory anemia   Bone Marrow Biopsy and Aspiration Procedure Note   Informed consent was obtained and potential risks including bleeding, infection and pain were reviewed with the patient.  The patient's name, date of birth, identification, consent and allergies were verified prior to the start of procedure and time out was performed.  The right posterior iliac crest was chosen as the site of biopsy.  The skin was prepped with ChloraPrep.   15cc of 2% lidocaine was used to provide local anaesthesia.   8 cc of bone marrow aspirate was obtained followed by 1cm biopsy.  Pressure was applied to the biopsy site and bandage was placed over the biopsy site. Patient was made to lie on the back for 30 mins prior to discharge.  The procedure was tolerated moderately well. COMPLICATIONS: None BLOOD LOSS: none The patient was discharged home in stable condition with a follow up next week to review results.  Patient was provided with post bone marrow biopsy instructions and instructed to call if there was any bleeding or worsening pain.  Specimens sent for flow cytometry, cytogenetics and additional studies.  Signed Scot Dock, NP

## 2017-05-19 NOTE — Progress Notes (Signed)
Patient left treatment area prior to having discharge vitals taken and discharge paperwork.

## 2017-05-20 ENCOUNTER — Telehealth: Payer: Self-pay | Admitting: Adult Health

## 2017-05-20 NOTE — Telephone Encounter (Signed)
Per 3/27 no los °

## 2017-05-22 NOTE — Progress Notes (Signed)
Mallory Klein  [phone] [phone]  Clinic Follow up Note   Patient Care Team: Plotnikov, Evie Lacks, MD as PCP - General Magda Bernheim, MD as Referring Physician (Orthopedic Surgery) Irene Shipper, MD (Gastroenterology) Eustace Moore, MD as Consulting Physician (Neurosurgery) Clydell Hakim, MD as Consulting Physician (Pain Medicine) Truitt Merle, MD as Consulting Physician (Hematology) Alla Feeling, NP as Nurse Practitioner (Nurse Practitioner)  Date of Service: 05/24/2017   DIAGNOSIS: Iron deficiency anemia, thrombocytosis  CURRENT THERAPY: began oral iron supplement 1 tab daily on 03/19/17, increased to 1 tab BID on 04/02/17; s/p IV Feraheme 05/03/17, 05/11/17  INTERVAL HISTORY: [name] returns for follow up after her Bone Marrow Biopsy on 05/19/17. She presents to the clinic today by herself. She reports she did well with bone marrow biopsy. She states it was painful but she handled it. She has recovered now.   On review of systems, pt denies any other complaints at this time. Pertinent positives are listed and detailed within the above HPI.   MEDICAL HISTORY:  Past Medical History:  Diagnosis Date  . Anxiety   . Asthma   . Depression   . Diverticulosis of colon (without mention of hemorrhage)   . Esophageal stricture   . GERD (gastroesophageal reflux disease)   . Heart murmur   . Hypertension   . Internal hemorrhoids without mention of complication   . Osteoarthritis   . Shortness of breath dyspnea    with allergies    SURGICAL HISTORY: Past Surgical History:  Procedure Laterality Date  . ABDOMINAL HYSTERECTOMY    . BREAST CYST EXCISION Left 1980's  . LUMBAR LAMINECTOMY/DECOMPRESSION MICRODISCECTOMY Bilateral 02/20/2014   Procedure: LUMBAR TWO THREE, LUMBAR THREE FOUR LUMBAR LAMINECTOMY/DECOMPRESSION MICRODISCECTOMY 2 LEVELS;  Surgeon: Eustace Moore, MD;  Location: [place] [facility];  Service: Neurosurgery;  Laterality:  Bilateral;  . SPLENECTOMY    . TIBIA FRACTURE SURGERY Right   . TIBIA FRACTURE SURGERY Left   . TOTAL KNEE ARTHROPLASTY Right 2013    I have reviewed the social history and family history with the patient and they are unchanged from previous note.  ALLERGIES:  is allergic to ace inhibitors; aspirin; and citalopram hydrobromide.  MEDICATIONS:  Current Outpatient Medications  Medication Sig Dispense Refill  . albuterol (PROVENTIL HFA;VENTOLIN HFA) 108 (90 Base) MCG/ACT inhaler Inhale 2 puffs into the lungs every 6 (six) hours as needed for wheezing or shortness of breath. 1 Inhaler 6  . ALPRAZolam (XANAX) 0.5 MG tablet Take 1 tablet (0.5 mg total) by mouth 2 (two) times daily as needed. (Patient taking differently: Take 0.5 mg by mouth 2 (two) times daily as needed for anxiety. ) 180 tablet 0  . cetirizine (ZYRTEC) 10 MG tablet Take 10 mg by mouth daily.      . Cholecalciferol (VITAMIN D3) 1000 UNITS tablet Take 2 tablets by mouth once daily     . dexlansoprazole (DEXILANT) 60 MG capsule Take 1 capsule (60 mg total) by mouth daily. 90 capsule 3  . diclofenac sodium (VOLTAREN) 1 % GEL Apply 4 g topically 4 (four) times daily. As directed. (Patient taking differently: Apply 4 g topically 4 (four) times daily as needed (for pain resulting from spinal stenosis/arthritis.). ) 200 g 3  . fluticasone furoate-vilanterol (BREO ELLIPTA) 100-25 MCG/INH AEPB Inhale 1 puff into the lungs daily.    . methocarbamol (ROBAXIN) 750 MG tablet Take 1 tablet (750 mg total) by mouth daily as needed for muscle spasms. [place]  tablet 3  . montelukast (SINGULAIR) 10 MG tablet Take 1 tablet (10 mg total) by mouth daily. 90 tablet 1  . Omega-3 Fatty Acids (FISH OIL) 1000 MG CAPS Take 1 capsule by mouth daily.    . primidone (MYSOLINE) 50 MG tablet Take 1 tablet (50 mg total) by mouth 3 (three) times daily. 90 tablet 5  . venlafaxine XR (EFFEXOR XR) 150 MG 24 hr capsule Take 1 capsule (150 mg total) by mouth daily with  breakfast. 30 capsule 11  . verapamil (VERELAN PM) 180 MG 24 hr capsule Take 1 capsule (180 mg total) by mouth at bedtime. No further re-fills without an appointment. 30 capsule 11   No current facility-administered medications for this visit.     PHYSICAL EXAMINATION: ECOG PERFORMANCE STATUS: 2 - Symptomatic, <50% confined to bed  Vitals:   05/24/17 1302  BP: (!) 146/70  Pulse: 69  Resp: 18  Temp: 98 F (36.7 C)  SpO2: 95%   Filed Weights   05/24/17 1302  Weight: 178 lb 1.6 oz (80.8 kg)    GENERAL:alert, no distress and comfortable. Presents in wheelchair  SKIN: skin color, texture, turgor are normal, no rashes or significant lesions EYES: normal, Conjunctiva are pink and non-injected, sclera clear OROPHARYNX:no exudate, no erythema and lips, buccal mucosa, and tongue normal  LYMPH:  no palpable cervical or supraclavicular lymphadenopathy LUNGS: clear to auscultation bilaterally with normal breathing effort HEART: regular rate & rhythm (+) bilateral lower extremity edema ABDOMEN:abdomen soft, non-tender and normal bowel sounds Musculoskeletal:no cyanosis of digits and no clubbing  NEURO: alert & oriented x 3 with fluent speech, no focal motor/sensory deficits  LABORATORY DATA:  I have reviewed the data as listed CBC Latest Ref Rng & Units 05/24/2017 05/19/2017 05/11/2017  WBC 3.9 - 10.3 K/uL 8.5 12.7(H) 8.2  Hemoglobin 12.0 - 15.0 g/dL - - -  Hematocrit 34.8 - 46.6 % 37.5 35.4 33.3(L)  Platelets 145 - 400 K/uL 473(H) 386 523(H)     CMP Latest Ref Rng & Units 02/12/2017 10/09/2016 03/17/2016  Glucose 70 - 99 mg/dL 89 113(H) 91  BUN 6 - 23 mg/dL '12 11 13  ' Creatinine 0.40 - 1.20 mg/dL 0.82 0.90 1.28(H)  Sodium 135 - 145 mEq/L 140 142 137  Potassium 3.5 - 5.1 mEq/L 4.5 4.0 4.2  Chloride 96 - 112 mEq/L 101 104 101  CO2 19 - 32 mEq/L 35(H) 31 33(H)  Calcium 8.4 - 10.5 mg/dL 9.0 9.0 9.0  Total Protein 6.0 - 8.3 g/dL - 7.3 8.0  Total Bilirubin 0.2 - 1.2 mg/dL - 0.3 0.3    Alkaline Phos 39 - 117 U/L - 72 75  AST 0 - 37 U/L - 11 12  ALT 0 - 35 U/L - 6 6   PATHOLOGY Bone Marrow Flow Cytometry 05/19/17 - NO MONOCLONAL B CELL POPULATION OR ABERRANT T CELL PHENOTYPE IDENTIFIED.  Diagnosis 05/19/17 Bone Marrow, Aspirate,Biopsy, and Clot - HYPERCELLULAR BONE MARROW WITH TRILINEAGE HEMATOPOIESIS. - A FEW SMALL LYMPHOID AGGREGATES PRESENT. - SEE COMMENT. PERIPHERAL BLOOD: - NORMOCYTIC-HYPOCHROMIC ANEMIA. - LEUKOCYTOSIS. Diagnosis Note The bone marrow is hypercellular for age with trilineage hematopoiesis. Significant dyspoiesis or increase in blastic cells is not identified. The myeloid changes are considered non-specific, and not diagnostic of a myeloid neoplasm but correlation with cytogenetic studies is recommended. There are a few predominantly small lymphoid aggregates mostly composed of small lymphocytes. Flow cytometric analysis failed to show any monoclonal B-cell population or aberrant T-cell phenotype consistent with a reactive lymphoid process.  In addition, there is no evidence of a plasma cell neoplasm. Clinical correlation is recommended. (BNS:ah 3/28-29/19)   RADIOGRAPHIC STUDIES: I have personally reviewed the radiological images as listed and agreed with the findings in the report. No results found.   ASSESSMENT & PLAN: 72 year old female with past medical history of HTN, asthma, OA, anxiety, and depression found to have abnormal CBC in 2016 with recent worsening anemia  1. Anemia, iron deficiency -She presented for chronic anemia for at least 2 years, and recent moderate thrombocytosis. We previously discuss the possible etiology of her anemia, including nutritional, anemia of chronic disease vs primary marrow disease (MDS, MPN etc), and etiology for her thrombocytosis, MPN vs reactive etiology such as iron deficiency or inflammation. -Pt labs from 03/19/17 indicated that her anemia is due to iron deficiency. I advised her begin oral iron  supplement daily. After 4 weeks, Hgb increased from 9.2 to 10.3 on 04/02/17. She increased oral iron to BID -Her Hgb did not improve after increase in oral iron, and ferritin remained low although serum iron did improve some.  I recommended her to have IV ferriheme  -IV Feraheme on 05/03/17. Her ferritin subsequently improved drastically to 515 and 575 after IV feraheme on 05/11/17, iron studies in normal range -Tolerating oral iron BID, continue  -Labs reviewed today, her Hgb is 11.8 and her other blood counts are otherwise normal.  -The etiology of her iron deficiency is not uncertain, she does have diverticulosis on previous colonoscopy, last colonoscopy was 5 years ago, no GI bleeding, I recommend her follow-up with Dr. Henrene Pastor to see if she needs a repeated colonoscopy.   2. Thrombocytosis, intermittent leukocytosis  -She is S/p splenectomy. Platelets were normal for period of time from 2011 to 2013 but have been consistently elevated since then in 500K range.  -Her smear from 3/19 has moderate target, few teardrop, ovalocytes, and shistocytes, This is concerning for MPN. I called pt and recommended her to have a bone marrow biopsy.  -Her bone marrow biopsy surgical pathology 05/19/17 showed slightly hypocellular marrow, no morphological evidence of MDS.  I discussed with Dr. Gari Crown, the morphology of bone marrow was not impressive for MPN, also early stage MPN can not be ruled out given the marked hypercellular marrow. I will get JAK2 mutation test done on next lab draw, she agrees -BM cytogenetics test is still pending. I discussed her bone marrow biopsy results with her today in details  -Platelets were normal during her biopsy on 05/19/17 but again elevated at [place] today 94/1/19)  3. HTN, asthma -Continue f/u with PCP Dr. Alain Marion  4. Cancer Screening -her last colonoscopy was 5 years ago with a normal results. It was recommended repeat in 10 years by Dr. Henrene Pastor.  -No family history of colon  cancer -I will talk to Dr. Henrene Pastor to see if additional screening is necessary due to her iron deficient anemia  PLAN: -Her cytogenetics test is still pending. If negative, I will order a test for JAK2 to rule out ET. -I will message Dr. Henrene Pastor to see if additional screening is necessary -Lab in 2 months including JAK2  -Lab and f/u in 4 months    All questions were answered. The patient knows to call the clinic with any problems, questions or concerns. No barriers to learning was detected. I spent 20 counseling the patient face to face. The total time spent in the appointment was 25 minutes and more than 50% was on counseling and review of  test results   This document serves as a record of services personally performed by Truitt Merle, MD. It was created on her behalf by Theresia Bough, a trained medical scribe. The creation of this record is based on the scribe's personal observations and the provider's statements to them.   I have reviewed the above documentation for accuracy and completeness, and I agree with the above.    Truitt Merle, MD 05/24/17

## 2017-05-24 ENCOUNTER — Inpatient Hospital Stay: Payer: Medicare Other | Attending: Hematology

## 2017-05-24 ENCOUNTER — Encounter: Payer: Self-pay | Admitting: Hematology

## 2017-05-24 ENCOUNTER — Telehealth: Payer: Self-pay | Admitting: Hematology

## 2017-05-24 ENCOUNTER — Inpatient Hospital Stay (HOSPITAL_BASED_OUTPATIENT_CLINIC_OR_DEPARTMENT_OTHER): Payer: Medicare Other | Admitting: Hematology

## 2017-05-24 VITALS — BP 146/70 | HR 69 | Temp 98.0°F | Resp 18 | Ht 61.0 in | Wt 178.1 lb

## 2017-05-24 DIAGNOSIS — Z79899 Other long term (current) drug therapy: Secondary | ICD-10-CM | POA: Diagnosis not present

## 2017-05-24 DIAGNOSIS — R7989 Other specified abnormal findings of blood chemistry: Secondary | ICD-10-CM

## 2017-05-24 DIAGNOSIS — I10 Essential (primary) hypertension: Secondary | ICD-10-CM | POA: Diagnosis not present

## 2017-05-24 DIAGNOSIS — D509 Iron deficiency anemia, unspecified: Secondary | ICD-10-CM | POA: Diagnosis not present

## 2017-05-24 DIAGNOSIS — D649 Anemia, unspecified: Secondary | ICD-10-CM

## 2017-05-24 DIAGNOSIS — D508 Other iron deficiency anemias: Secondary | ICD-10-CM

## 2017-05-24 DIAGNOSIS — J45909 Unspecified asthma, uncomplicated: Secondary | ICD-10-CM | POA: Diagnosis not present

## 2017-05-24 DIAGNOSIS — D72829 Elevated white blood cell count, unspecified: Secondary | ICD-10-CM | POA: Insufficient documentation

## 2017-05-24 DIAGNOSIS — D473 Essential (hemorrhagic) thrombocythemia: Secondary | ICD-10-CM

## 2017-05-24 DIAGNOSIS — D75839 Thrombocytosis, unspecified: Secondary | ICD-10-CM | POA: Insufficient documentation

## 2017-05-24 DIAGNOSIS — F419 Anxiety disorder, unspecified: Secondary | ICD-10-CM | POA: Diagnosis not present

## 2017-05-24 DIAGNOSIS — D500 Iron deficiency anemia secondary to blood loss (chronic): Secondary | ICD-10-CM

## 2017-05-24 HISTORY — DX: Thrombocytosis, unspecified: D75.839

## 2017-05-24 LAB — CBC WITH DIFFERENTIAL (CANCER CENTER ONLY)
Basophils Absolute: 0 10*3/uL (ref 0.0–0.1)
Basophils Relative: 1 %
Eosinophils Absolute: 0.4 10*3/uL (ref 0.0–0.5)
Eosinophils Relative: 5 %
HEMATOCRIT: 37.5 % (ref 34.8–46.6)
HEMOGLOBIN: 11.8 g/dL (ref 11.6–15.9)
LYMPHS ABS: 2.2 10*3/uL (ref 0.9–3.3)
LYMPHS PCT: 26 %
MCH: 29.2 pg (ref 25.1–34.0)
MCHC: 31.5 g/dL (ref 31.5–36.0)
MCV: 92.8 fL (ref 79.5–101.0)
Monocytes Absolute: 0.7 10*3/uL (ref 0.1–0.9)
Monocytes Relative: 8 %
NEUTROS ABS: 5.2 10*3/uL (ref 1.5–6.5)
NEUTROS PCT: 60 %
Platelet Count: 473 10*3/uL — ABNORMAL HIGH (ref 145–400)
RBC: 4.04 MIL/uL (ref 3.70–5.45)
RDW: 20.4 % — ABNORMAL HIGH (ref 11.2–14.5)
WBC Count: 8.5 10*3/uL (ref 3.9–10.3)

## 2017-05-24 LAB — RETICULOCYTES
RBC.: 4.04 MIL/uL (ref 3.70–5.45)
Retic Count, Absolute: 80.8 10*3/uL (ref 33.7–90.7)
Retic Ct Pct: 2 % (ref 0.7–2.1)

## 2017-05-24 LAB — IRON AND TIBC
IRON: 79 ug/dL (ref 41–142)
SATURATION RATIOS: 31 % (ref 21–57)
TIBC: 256 ug/dL (ref 236–444)
UIBC: 177 ug/dL

## 2017-05-24 LAB — FERRITIN: Ferritin: 388 ng/mL — ABNORMAL HIGH (ref 9–269)

## 2017-05-24 NOTE — Telephone Encounter (Signed)
Appointmetns scheduled ACS/Calendar printed per 4/1 los/ patient also requested info on living wills and this was given as well

## 2017-06-01 ENCOUNTER — Encounter (HOSPITAL_COMMUNITY): Payer: Self-pay | Admitting: Hematology

## 2017-06-05 ENCOUNTER — Other Ambulatory Visit: Payer: Self-pay | Admitting: Hematology

## 2017-06-07 LAB — CHROMOSOME ANALYSIS, BONE MARROW

## 2017-06-08 ENCOUNTER — Telehealth: Payer: Self-pay | Admitting: *Deleted

## 2017-06-08 NOTE — Telephone Encounter (Signed)
-----  Message from Truitt Merle, MD sent at 06/05/2017 12:05 PM EDT ----- Please let pt know that her cytogenetics from her bone marrow biopsy was normal, thanks  Truitt Merle  06/05/2017

## 2017-06-08 NOTE — Telephone Encounter (Signed)
Spoke with pt and informed her of cytogenetics from bone marrow biopsy was normal as per Dr. Ernestina Penna instructions.  Pt voiced understanding.

## 2017-07-10 ENCOUNTER — Other Ambulatory Visit: Payer: Self-pay | Admitting: Internal Medicine

## 2017-07-16 ENCOUNTER — Telehealth: Payer: Self-pay | Admitting: Internal Medicine

## 2017-07-16 NOTE — Telephone Encounter (Signed)
Copied from [place] [id number]. Topic: Quick Communication - See Telephone Encounter >> Jul 16, 2017  2:18 PM Mallory Klein wrote: CRM for notification. See Telephone encounter for: 07/16/17. Had back surgery 3-4 years ago, tried to reach surgeon for 2 days with no response. She is having terrible pain and does not want to go all weekend with no pain meds. States the hydrocodone is not working. Any thing Dr Alain Marion can do or prescribe something? [facility] [address]

## 2017-07-27 ENCOUNTER — Inpatient Hospital Stay: Payer: Medicare Other | Attending: Hematology

## 2017-07-27 DIAGNOSIS — D75839 Thrombocytosis, unspecified: Secondary | ICD-10-CM

## 2017-07-27 DIAGNOSIS — D649 Anemia, unspecified: Secondary | ICD-10-CM | POA: Diagnosis not present

## 2017-07-27 DIAGNOSIS — D508 Other iron deficiency anemias: Secondary | ICD-10-CM

## 2017-07-27 DIAGNOSIS — D473 Essential (hemorrhagic) thrombocythemia: Secondary | ICD-10-CM | POA: Insufficient documentation

## 2017-07-27 LAB — CBC WITH DIFFERENTIAL (CANCER CENTER ONLY)
BASOS ABS: 0 10*3/uL (ref 0.0–0.1)
Basophils Relative: 0 %
Eosinophils Absolute: 0.4 10*3/uL (ref 0.0–0.5)
Eosinophils Relative: 4 %
HEMATOCRIT: 42.9 % (ref 34.8–46.6)
Hemoglobin: 13.5 g/dL (ref 11.6–15.9)
LYMPHS PCT: 34 %
Lymphs Abs: 3.2 10*3/uL (ref 0.9–3.3)
MCH: 29.3 pg (ref 25.1–34.0)
MCHC: 31.5 g/dL (ref 31.5–36.0)
MCV: 93.3 fL (ref 79.5–101.0)
MONO ABS: 0.7 10*3/uL (ref 0.1–0.9)
MONOS PCT: 7 %
NEUTROS ABS: 5 10*3/uL (ref 1.5–6.5)
NEUTROS PCT: 55 %
Platelet Count: 369 10*3/uL (ref 145–400)
RBC: 4.6 MIL/uL (ref 3.70–5.45)
RDW: 15.8 % — AB (ref 11.2–14.5)
WBC Count: 9.3 10*3/uL (ref 3.9–10.3)

## 2017-07-27 LAB — RETICULOCYTES
RBC.: 4.6 MIL/uL (ref 3.70–5.45)
RETIC COUNT ABSOLUTE: 69 10*3/uL (ref 33.7–90.7)
Retic Ct Pct: 1.5 % (ref 0.7–2.1)

## 2017-07-27 LAB — FERRITIN: Ferritin: 51 ng/mL (ref 9–269)

## 2017-07-27 LAB — IRON AND TIBC
IRON: 68 ug/dL (ref 41–142)
Saturation Ratios: 28 % (ref 21–57)
TIBC: 244 ug/dL (ref 236–444)
UIBC: 175 ug/dL

## 2017-07-29 ENCOUNTER — Telehealth: Payer: Self-pay

## 2017-07-29 NOTE — Telephone Encounter (Signed)
-----   Message from Alla Feeling, NP sent at 07/27/2017  3:47 PM EDT ----- Please let her know iron studies are normal, she is not anemic. Continue oral iron. Other studies are pending. Will contact her when results are back.  Thanks, Regan Rakers NP

## 2017-07-29 NOTE — Telephone Encounter (Signed)
Notified patient per Mallory Rue NP iron studies came back, not anemic, instructed to continue the oral iron supplement that she is currently taking.  Other test results are pending and we will notify patient when those come back.

## 2017-07-31 ENCOUNTER — Other Ambulatory Visit: Payer: Self-pay | Admitting: Internal Medicine

## 2017-08-03 LAB — HM MAMMOGRAPHY

## 2017-08-10 ENCOUNTER — Telehealth: Payer: Self-pay | Admitting: Nurse Practitioner

## 2017-08-10 LAB — JAK2 (INCLUDING V617F AND EXON 12), MPL,& CALR W/RFL MPN PANEL (NGS)

## 2017-08-10 NOTE — Telephone Encounter (Signed)
I reviewed NGS report including JAK2 which is normal, no mutations were detected. I discussed with Dr. Burr Medico who feels that given fluctuating platelet count with normal level on 07/27/17, thrombocytosis is likely reactive rather than ET. Pt continues oral iron replacement 1 daily. She will return for lab, f/u with Dr. Burr Medico on 09/27/17. She knows to call sooner with new or worsening needs. She appreciates the call.

## 2017-08-19 ENCOUNTER — Encounter: Payer: Self-pay | Admitting: Internal Medicine

## 2017-09-01 ENCOUNTER — Telehealth: Payer: Self-pay | Admitting: Internal Medicine

## 2017-09-01 DIAGNOSIS — M5440 Lumbago with sciatica, unspecified side: Principal | ICD-10-CM

## 2017-09-01 DIAGNOSIS — M48061 Spinal stenosis, lumbar region without neurogenic claudication: Secondary | ICD-10-CM

## 2017-09-01 DIAGNOSIS — G8929 Other chronic pain: Secondary | ICD-10-CM

## 2017-09-01 NOTE — Telephone Encounter (Signed)
Copied from [place] [id number]. Topic: General - Other >> Sep 01, 2017  9:47 AM Ivar Drape wrote: Reason for CRM:   Patient has been diagnosed with Spinal Stenosis and she would like to know if Dr. Alain Marion would refer her to someone at [facility] for a second opinion.  Please advise.  If no answer the information can be left on the answering machine.  >> Sep 01, 2017  9:51 AM Ivar Drape wrote: Patient also stated that she needs something for pain.

## 2017-09-01 NOTE — Telephone Encounter (Signed)
Please advise 

## 2017-09-03 NOTE — Telephone Encounter (Signed)
Reason for CRM: Patient called to inquire about her message to Dr. Mamie Nick on 09/01/2017. Patient states that she wants a referral from Dr. Mamie Nick to [facility] for a second opinion regarding patient's Spinal Stenosis diagnosis. Patient also needs a pain medication for  her back pain. She says that Oxycodone does not provide any pain relief for her. Please call patient.    Thank You!!!

## 2017-09-05 NOTE — Telephone Encounter (Signed)
We can ref to pain clinic Ref to [facility] was placed Thx

## 2017-09-05 NOTE — Addendum Note (Signed)
Addended by: Cassandria Anger on: 09/05/2017 06:52 PM   Modules accepted: Orders

## 2017-09-06 NOTE — Telephone Encounter (Signed)
Pt called and was informed about her referral, pt did inquire about a resolution for a medication for her back pain, contact pt to advise

## 2017-09-09 NOTE — Telephone Encounter (Signed)
Pain clinic ref is made Thx

## 2017-09-09 NOTE — Telephone Encounter (Signed)
Pt would like referral to pain clinic

## 2017-09-09 NOTE — Addendum Note (Signed)
Addended by: Cassandria Anger on: 09/09/2017 10:07 PM   Modules accepted: Orders

## 2017-09-17 ENCOUNTER — Ambulatory Visit: Payer: Medicare Other | Admitting: Internal Medicine

## 2017-09-21 ENCOUNTER — Ambulatory Visit: Payer: Medicare Other | Admitting: Internal Medicine

## 2017-09-22 ENCOUNTER — Ambulatory Visit: Payer: Medicare Other | Admitting: Internal Medicine

## 2017-09-22 ENCOUNTER — Encounter: Payer: Self-pay | Admitting: Internal Medicine

## 2017-09-22 DIAGNOSIS — D75839 Thrombocytosis, unspecified: Secondary | ICD-10-CM

## 2017-09-22 DIAGNOSIS — J4540 Moderate persistent asthma, uncomplicated: Secondary | ICD-10-CM | POA: Diagnosis not present

## 2017-09-22 DIAGNOSIS — D473 Essential (hemorrhagic) thrombocythemia: Secondary | ICD-10-CM

## 2017-09-22 DIAGNOSIS — F4323 Adjustment disorder with mixed anxiety and depressed mood: Secondary | ICD-10-CM

## 2017-09-22 DIAGNOSIS — I10 Essential (primary) hypertension: Secondary | ICD-10-CM

## 2017-09-22 DIAGNOSIS — M5440 Lumbago with sciatica, unspecified side: Secondary | ICD-10-CM

## 2017-09-22 DIAGNOSIS — R7989 Other specified abnormal findings of blood chemistry: Secondary | ICD-10-CM

## 2017-09-22 DIAGNOSIS — G250 Essential tremor: Secondary | ICD-10-CM

## 2017-09-22 DIAGNOSIS — R609 Edema, unspecified: Secondary | ICD-10-CM

## 2017-09-22 DIAGNOSIS — G8929 Other chronic pain: Secondary | ICD-10-CM

## 2017-09-22 MED ORDER — VERAPAMIL HCL ER 120 MG PO CP24: 120.0000 mg | ORAL_CAPSULE | Freq: Every day | ORAL | 11 refills | Status: DC

## 2017-09-22 NOTE — Assessment & Plan Note (Signed)
CBC

## 2017-09-22 NOTE — Assessment & Plan Note (Signed)
CBC/iron 

## 2017-09-22 NOTE — Assessment & Plan Note (Signed)
No change 

## 2017-09-22 NOTE — Assessment & Plan Note (Signed)
Wt Readings from Last 3 Encounters:  05/24/17 178 lb 1.6 oz (80.8 kg)  05/14/17 179 lb (81.2 kg)  05/11/17 180 lb 3.2 oz (81.7 kg)

## 2017-09-22 NOTE — Assessment & Plan Note (Signed)
Ref to [facility] Ref to Pain Clinic

## 2017-09-22 NOTE — Assessment & Plan Note (Signed)
On Rx 

## 2017-09-22 NOTE — Assessment & Plan Note (Signed)
On Breo, Singulair

## 2017-09-22 NOTE — Assessment & Plan Note (Signed)
Verapamil 

## 2017-09-22 NOTE — Progress Notes (Signed)
Subjective:  Patient ID: [name], female    DOB: 09-20-1945  Age: 72 y.o. MRN: [id number]  CC: No chief complaint on file.   HPI DANNELL Klein presents for anemia, LBP, anxiety f/u   Outpatient Medications Prior to Visit  Medication Sig Dispense Refill  . albuterol (PROVENTIL HFA;VENTOLIN HFA) 108 (90 Base) MCG/ACT inhaler Inhale 2 puffs into the lungs every 6 (six) hours as needed for wheezing or shortness of breath. 1 Inhaler 6  . ALPRAZolam (XANAX) 0.5 MG tablet Take 1 tablet (0.5 mg total) by mouth 2 (two) times daily as needed. (Patient taking differently: Take 0.5 mg by mouth 2 (two) times daily as needed for anxiety. ) 180 tablet 0  . cetirizine (ZYRTEC) 10 MG tablet Take 10 mg by mouth daily.      . Cholecalciferol (VITAMIN D3) 1000 UNITS tablet Take 2 tablets by mouth once daily     . dexlansoprazole (DEXILANT) 60 MG capsule Take 1 capsule (60 mg total) by mouth daily. 90 capsule 3  . diclofenac sodium (VOLTAREN) 1 % GEL Apply 4 g topically 4 (four) times daily. As directed. (Patient taking differently: Apply 4 g topically 4 (four) times daily as needed (for pain resulting from spinal stenosis/arthritis.). ) 200 g 3  . fluticasone furoate-vilanterol (BREO ELLIPTA) 100-25 MCG/INH AEPB Inhale 1 puff into the lungs daily.    . methocarbamol (ROBAXIN) 750 MG tablet Take 1 tablet (750 mg total) by mouth daily as needed for muscle spasms. 30 tablet 3  . montelukast (SINGULAIR) 10 MG tablet TAKE 1 TABLET BY MOUTH EVERY DAY 90 tablet 1  . Omega-3 Fatty Acids (FISH OIL) 1000 MG CAPS Take 1 capsule by mouth daily.    . primidone (MYSOLINE) 50 MG tablet Take 1 tablet (50 mg total) by mouth 3 (three) times daily. 90 tablet 5  . venlafaxine XR (EFFEXOR-XR) 150 MG 24 hr capsule TAKE 1 CAPSULE BY MOUTH EVERY DAY WITH BREAKFAST 90 capsule 3  . verapamil (VERELAN PM) 180 MG 24 hr capsule Take 1 capsule (180 mg total) by mouth at bedtime. No further re-fills without an appointment. 30  capsule 11   No facility-administered medications prior to visit.     ROS: Review of Systems  Constitutional: Positive for fatigue. Negative for activity change, appetite change, chills and unexpected weight change.  HENT: Negative for congestion, mouth sores and sinus pressure.   Eyes: Negative for visual disturbance.  Respiratory: Negative for cough and chest tightness.   Gastrointestinal: Negative for abdominal pain and nausea.  Genitourinary: Negative for difficulty urinating, frequency and vaginal pain.  Musculoskeletal: Positive for back pain and gait problem.  Skin: Negative for pallor and rash.  Neurological: Negative for dizziness, tremors, weakness, numbness and headaches.  Psychiatric/Behavioral: Positive for dysphoric mood. Negative for confusion, sleep disturbance and suicidal ideas. The patient is nervous/anxious.     Objective:  BP 134/72 (BP Location: Left Arm, Patient Position: Sitting, Cuff Size: Large)   Pulse 72   Temp 98.1 F (36.7 C) (Oral)   Ht 5\' 1"  (1.549 m)   SpO2 92%   BMI 33.65 kg/m   BP Readings from Last 3 Encounters:  09/22/17 134/72  05/24/17 (!) 146/70  05/14/17 (!) 142/82    Wt Readings from Last 3 Encounters:  05/24/17 178 lb 1.6 oz (80.8 kg)  05/14/17 179 lb (81.2 kg)  05/11/17 180 lb 3.2 oz (81.7 kg)    Physical Exam  Constitutional: She appears well-developed. No distress.  HENT:  Head: Normocephalic.  Right Ear: External ear normal.  Left Ear: External ear normal.  Nose: Nose normal.  Mouth/Throat: Oropharynx is clear and moist.  Eyes: Pupils are equal, round, and reactive to light. Conjunctivae are normal. Right eye exhibits no discharge. Left eye exhibits no discharge.  Neck: Normal range of motion. Neck supple. No JVD present. No tracheal deviation present. No thyromegaly present.  Cardiovascular: Normal rate, regular rhythm and normal heart sounds.  Pulmonary/Chest: No stridor. No respiratory distress. She has no wheezes.    Abdominal: Soft. Bowel sounds are normal. She exhibits no distension and no mass. There is no tenderness. There is no rebound and no guarding.  Musculoskeletal: She exhibits tenderness. She exhibits no edema.  Lymphadenopathy:    She has no cervical adenopathy.  Neurological: She displays abnormal reflex. No cranial nerve deficit. She exhibits abnormal muscle tone. Coordination abnormal.  Skin: No rash noted. No erythema.  Psychiatric: She has a normal mood and affect. Her behavior is normal. Judgment and thought content normal.  in a w/c obese LS pain w/ROM  Lab Results  Component Value Date   WBC 9.3 07/27/2017   HGB 13.5 07/27/2017   HCT 42.9 07/27/2017   PLT 369 07/27/2017   GLUCOSE 89 02/12/2017   CHOL 219 (H) 02/05/2015   TRIG 69.0 02/05/2015   HDL 73.70 02/05/2015   LDLDIRECT 109.8 02/10/2010   LDLCALC 132 (H) 02/05/2015   ALT 6 10/09/2016   AST 11 10/09/2016   NA 140 02/12/2017   K 4.5 02/12/2017   CL 101 02/12/2017   CREATININE 0.82 02/12/2017   BUN 12 02/12/2017   CO2 35 (H) 02/12/2017   TSH 0.82 02/12/2017   INR 0.97 02/07/2014   HGBA1C 6.2 02/12/2017    Dg Chest 2 View  Result Date: 05/13/2016 CLINICAL DATA:  Intermittent shortness of Breath. EXAM: CHEST  2 VIEW COMPARISON:  09/22/2015 FINDINGS: Heart is borderline in size. Lingular scarring, stable. Right lung is clear. No effusions or acute bony abnormality. IMPRESSION: Borderline cardiomegaly.  Lingular scarring.  No active disease. Electronically Signed   By: Rolm Baptise M.D.   On: 05/13/2016 08:06    Assessment & Plan:   Diagnoses and all orders for this visit:  Essential hypertension  Moderate persistent asthma, unspecified whether complicated  Essential tremor  OBESITY, MORBID  Thrombocytosis (HCC)  Adjustment disorder with mixed anxiety and depressed mood  Chronic midline low back pain with sciatica, sciatica laterality unspecified     No orders of the defined types were placed in  this encounter.    Follow-up: No follow-ups on file.  Walker Kehr, MD

## 2017-09-22 NOTE — Assessment & Plan Note (Signed)
Reduce Verapamil

## 2017-09-25 NOTE — Progress Notes (Signed)
[facility]  [phone] [phone]  Clinic Follow up Note   Patient Care Team: Plotnikov, Evie Lacks, MD as PCP - General Magda Bernheim, MD as Referring Physician (Orthopedic Surgery) Irene Shipper, MD (Gastroenterology) Eustace Moore, MD as Consulting Physician (Neurosurgery) Clydell Hakim, MD as Consulting Physician (Pain Medicine) Truitt Merle, MD as Consulting Physician (Hematology) Alla Feeling, NP as Nurse Practitioner (Nurse Practitioner)  Date of Service: 09/27/2017   DIAGNOSIS: Iron deficiency anemia, thrombocytosis  CURRENT THERAPY: began oral iron supplement 1 tab daily on 03/19/17, increased to 1 tab BID on 04/02/17; s/p IV Feraheme 05/03/17, 05/11/17  INTERVAL HISTORY: Mallory Klein returns for follow up. She is here with her family member.  She is doing well overall.  She had a UTI recently and is seeing a urologist and is feeling better now. She is taking her 1 iron pill a day. She has not seen Dr. Henrene Pastor lately.     MEDICAL HISTORY:  Past Medical History:  Diagnosis Date  . Anxiety   . Asthma   . Depression   . Diverticulosis of colon (without mention of hemorrhage)   . Esophageal stricture   . GERD (gastroesophageal reflux disease)   . Heart murmur   . Hypertension   . Internal hemorrhoids without mention of complication   . Osteoarthritis   . Shortness of breath dyspnea    with allergies    SURGICAL HISTORY: Past Surgical History:  Procedure Laterality Date  . ABDOMINAL HYSTERECTOMY    . BREAST CYST EXCISION Left 1980's  . LUMBAR LAMINECTOMY/DECOMPRESSION MICRODISCECTOMY Bilateral 02/20/2014   Procedure: LUMBAR TWO THREE, LUMBAR THREE FOUR LUMBAR LAMINECTOMY/DECOMPRESSION MICRODISCECTOMY 2 LEVELS;  Surgeon: Eustace Moore, MD;  Location: [facility];  Service: Neurosurgery;  Laterality: Bilateral;  . SPLENECTOMY    . TIBIA FRACTURE SURGERY Right   . TIBIA FRACTURE SURGERY Left   . TOTAL KNEE ARTHROPLASTY Right  2013    I have reviewed the social history and family history with the patient and they are unchanged from previous note.  ALLERGIES:  is allergic to ace inhibitors; aspirin; and citalopram hydrobromide.  MEDICATIONS:  Current Outpatient Medications  Medication Sig Dispense Refill  . albuterol (PROVENTIL HFA;VENTOLIN HFA) 108 (90 Base) MCG/ACT inhaler Inhale 2 puffs into the lungs every 6 (six) hours as needed for wheezing or shortness of breath. 1 Inhaler 6  . cetirizine (ZYRTEC) 10 MG tablet Take 10 mg by mouth daily.      [name] dexlansoprazole (DEXILANT) 60 MG capsule Take 1 capsule (60 mg total) by mouth daily. 90 capsule 3  . diclofenac sodium (VOLTAREN) 1 % GEL Apply 4 g topically 4 (four) times daily. As directed. (Patient taking differently: Apply 4 g topically 4 (four) times daily as needed (for pain resulting from spinal stenosis/arthritis.). ) 200 g 3  . fluticasone furoate-vilanterol (BREO ELLIPTA) 100-25 MCG/INH AEPB Inhale 1 puff into the lungs daily.    . montelukast (SINGULAIR) 10 MG tablet TAKE 1 TABLET BY MOUTH EVERY DAY 90 tablet 1  . primidone (MYSOLINE) 50 MG tablet Take 1 tablet (50 mg total) by mouth 3 (three) times daily. 90 tablet 5  . venlafaxine XR (EFFEXOR-XR) 150 MG 24 hr capsule TAKE 1 CAPSULE BY MOUTH EVERY DAY WITH BREAKFAST 90 capsule 3  . verapamil (VERELAN) 120 MG 24 hr capsule Take 1 capsule (120 mg total) by mouth at bedtime. 30 capsule 11  . Cholecalciferol (VITAMIN D3) 1000 UNITS tablet Take 2 tablets by  mouth once daily      No current facility-administered medications for this visit.    REVIEW OF SYSTEMS:   Constitutional: Denies fevers, chills or abnormal night sweats Eyes: Denies blurriness of vision, double vision or watery eyes Ears, nose, mouth, throat, and face: Denies mucositis or sore throat Respiratory: Denies cough, dyspnea or wheezes Cardiovascular: Denies palpitation, chest discomfort or lower extremity swelling Gastrointestinal:  Denies  nausea, heartburn or change in bowel habits Skin: Denies abnormal skin rashes Lymphatics: Denies new lymphadenopathy or easy bruising Neurological:Denies numbness, tingling or new weaknesses Behavioral/Psych: Mood is stable, no new changes  All other systems were reviewed with the patient and are negative.   PHYSICAL EXAMINATION: ECOG PERFORMANCE STATUS: 2 - Symptomatic, <50% confined to bed  Vitals:   09/27/17 1351  BP: 123/75  Pulse: 84  Resp: 18  Temp: 98.1 F (36.7 C)  SpO2: 97%   Filed Weights   09/27/17 1351  Weight: 181 lb 11.2 oz (82.4 kg)    GENERAL:alert, no distress and comfortable. Presents in wheelchair  SKIN: skin color, texture, turgor are normal, no rashes or significant lesions EYES: normal, Conjunctiva are pink and non-injected, sclera clear OROPHARYNX:no exudate, no erythema and lips, buccal mucosa, and tongue normal  LYMPH:  no palpable cervical or supraclavicular lymphadenopathy LUNGS: clear to auscultation bilaterally with normal breathing effort HEART: regular rate & rhythm (+) bilateral lower extremity edema ABDOMEN:abdomen soft, non-tender and normal bowel sounds Musculoskeletal:no cyanosis of digits and no clubbing  NEURO: alert & oriented x 3 with fluent speech, no focal motor/sensory deficits  LABORATORY DATA:  I have reviewed the data as listed CBC Latest Ref Rng & Units 09/27/2017 07/27/2017 05/24/2017  WBC 3.9 - 10.3 K/uL 10.2 9.3 8.5  Hemoglobin 11.6 - 15.9 g/dL 12.8 13.5 11.8  Hematocrit 34.8 - 46.6 % 40.6 42.9 37.5  Platelets 145 - 400 K/uL 361 369 473(H)     CMP Latest Ref Rng & Units 02/12/2017 10/09/2016 03/17/2016  Glucose 70 - 99 mg/dL 89 113(H) 91  BUN 6 - 23 mg/dL _0 Creatinine 0.40 - 1.20 mg/dL 0.82 0.90 1.28(H)  Sodium 135 - 145 mEq/L 140 142 137  Potassium 3.5 - 5.1 mEq/L 4.5 4.0 4.2  Chloride 96 - 112 mEq/L 101 104 101  CO2 19 - 32 mEq/L 35(H) 31 33(H)  Calcium 8.4 - 10.5 mg/dL 9.0 9.0 9.0  Total Protein 6.0 - 8.3 g/dL  - 7.3 8.0  Total Bilirubin 0.2 - 1.2 mg/dL - 0.3 0.3  Alkaline Phos 39 - 117 U/L - 72 75  AST 0 - 37 U/L - 11 12  ALT 0 - 35 U/L - 6 6   PATHOLOGY  08/10/2017 Genetics   06/01/2017 Cytogenetics    Bone Marrow Flow Cytometry 05/19/17 - NO MONOCLONAL B CELL POPULATION OR ABERRANT T CELL PHENOTYPE IDENTIFIED.  Diagnosis 05/19/17 Bone Marrow, Aspirate,Biopsy, and Clot - HYPERCELLULAR BONE MARROW WITH TRILINEAGE HEMATOPOIESIS. - A FEW SMALL LYMPHOID AGGREGATES PRESENT. - SEE COMMENT. PERIPHERAL BLOOD: - NORMOCYTIC-HYPOCHROMIC ANEMIA. - LEUKOCYTOSIS. Diagnosis Note The bone marrow is hypercellular for age with trilineage hematopoiesis. Significant dyspoiesis or increase in blastic cells is not identified. The myeloid changes are considered non-specific, and not diagnostic of a myeloid neoplasm but correlation with cytogenetic studies is recommended. There are a few predominantly small lymphoid aggregates mostly composed of small lymphocytes. Flow cytometric analysis failed to show any monoclonal B-cell population or aberrant T-cell phenotype consistent with a reactive lymphoid process. In addition, there is  no evidence of a plasma cell neoplasm. Clinical correlation is recommended. (BNS:ah 3/28-29/19)   RADIOGRAPHIC STUDIES: I have personally reviewed the radiological images as listed and agreed with the findings in the report. No results found.   ASSESSMENT & PLAN:  72 year old female with past medical history of HTN, asthma, OA, anxiety, and depression found to have abnormal CBC in 2016 with recent worsening anemia  1. Anemia, iron deficiency -She presented for chronic anemia for at least 2 years, and recent moderate thrombocytosis.  -Pt labs from 03/19/17 indicated that her anemia is due to iron deficiency. I advised her begin oral iron supplement daily. After 4 weeks, Hgb increased from 9.2 to 10.3 on 04/02/17. She increased oral iron to BID -Her Hgb did not improve after increase  in oral iron, and ferritin remained low although serum iron did improve some.  I recommended her to have IV ferriheme  -IV Feraheme on 05/03/17. She responded well, anemia resolved with Hb improved to 11.8  -Tolerating oral iron daily well, will continue  -Labs reviewed today, her Hgb is 12.8 and her other blood counts are normal.  -The etiology of her iron deficiency is not uncertain, she does have diverticulosis on previous colonoscopy, last colonoscopy was 5 years ago, no GI bleeding, I recommend her follow-up with Dr. Henrene Pastor to see if she needs a repeated colonoscopy. -We will continue to monitor her blood counts every 2 months  2. Thrombocytosis, intermittent leukocytosis  -She is S/p splenectomy. Platelets were normal for period of time from 2011 to 2013 but have been consistently elevated since then in 500K range.  -Her smear from 3/19 has moderate target, few teardrop, ovalocytes, and shistocytes, This is concerning for MPN. I called pt and recommended her to have a bone marrow biopsy.  -Her bone marrow biopsy surgical pathology 05/19/17 showed slightly hypocellular marrow, no morphological evidence of MDS.  I discussed with Dr. Gari Crown, the morphology of bone marrow was not impressive for MPN, also early stage MPN can not be ruled out given the marked hypercellular marrow.  -BM cytogenetics test is normal. -MPN genetic panel including JAK2 was negative  -I discussed the above with pt and her family member  -Her thrombocytosis and mild leukocytosis has resolved now   3. HTN, asthma -Continue f/u with PCP Dr. Alain Marion  4. Cancer Screening -her last colonoscopy was 5 years ago with a normal results. It was recommended repeat in 10 years by Dr. Henrene Pastor.  -No family history of colon cancer -I will talk to Dr. Henrene Pastor to see if additional screening is necessary due to her iron deficient anemia  PLAN: -Lab reviewed, no anemia, iron study still pending, I will call her with her lab results -I  recommend repeat colonoscopy with Dr. Henrene Pastor -Lab every 2 months  -f/u in 6 months with lab a few days before  All questions were answered. The patient knows to call the clinic with any problems, questions or concerns. No barriers to learning was detected. I spent 61mns counseling the patient face to face. The total time spent in the appointment was 25 minutes and more than 50% was on counseling and review of test results  I, Noor Dweik am acting as scribe for Dr. YTruitt Merle  I have reviewed the above documentation for accuracy and completeness, and I agree with the above.    YTruitt Merle MD 09/27/17

## 2017-09-27 ENCOUNTER — Encounter: Payer: Self-pay | Admitting: Hematology

## 2017-09-27 ENCOUNTER — Telehealth: Payer: Self-pay | Admitting: Hematology

## 2017-09-27 ENCOUNTER — Inpatient Hospital Stay (HOSPITAL_BASED_OUTPATIENT_CLINIC_OR_DEPARTMENT_OTHER): Payer: Medicare Other | Admitting: Hematology

## 2017-09-27 ENCOUNTER — Inpatient Hospital Stay: Payer: Medicare Other | Attending: Hematology

## 2017-09-27 VITALS — BP 123/75 | HR 84 | Temp 98.1°F | Resp 18 | Ht 61.0 in | Wt 181.7 lb

## 2017-09-27 DIAGNOSIS — D509 Iron deficiency anemia, unspecified: Secondary | ICD-10-CM | POA: Insufficient documentation

## 2017-09-27 DIAGNOSIS — I10 Essential (primary) hypertension: Secondary | ICD-10-CM

## 2017-09-27 DIAGNOSIS — K5790 Diverticulosis of intestine, part unspecified, without perforation or abscess without bleeding: Secondary | ICD-10-CM | POA: Diagnosis not present

## 2017-09-27 DIAGNOSIS — J45998 Other asthma: Secondary | ICD-10-CM | POA: Insufficient documentation

## 2017-09-27 DIAGNOSIS — D473 Essential (hemorrhagic) thrombocythemia: Secondary | ICD-10-CM

## 2017-09-27 DIAGNOSIS — Z9081 Acquired absence of spleen: Secondary | ICD-10-CM | POA: Insufficient documentation

## 2017-09-27 DIAGNOSIS — D508 Other iron deficiency anemias: Secondary | ICD-10-CM

## 2017-09-27 DIAGNOSIS — D75839 Thrombocytosis, unspecified: Secondary | ICD-10-CM

## 2017-09-27 DIAGNOSIS — D500 Iron deficiency anemia secondary to blood loss (chronic): Secondary | ICD-10-CM

## 2017-09-27 DIAGNOSIS — D649 Anemia, unspecified: Secondary | ICD-10-CM

## 2017-09-27 LAB — CBC WITH DIFFERENTIAL (CANCER CENTER ONLY)
BASOS ABS: 0 10*3/uL (ref 0.0–0.1)
BASOS PCT: 0 %
Eosinophils Absolute: 0.4 10*3/uL (ref 0.0–0.5)
Eosinophils Relative: 4 %
HEMATOCRIT: 40.6 % (ref 34.8–46.6)
Hemoglobin: 12.8 g/dL (ref 11.6–15.9)
Lymphocytes Relative: 33 %
Lymphs Abs: 3.4 10*3/uL — ABNORMAL HIGH (ref 0.9–3.3)
MCH: 29.6 pg (ref 25.1–34.0)
MCHC: 31.5 g/dL (ref 31.5–36.0)
MCV: 94 fL (ref 79.5–101.0)
MONO ABS: 0.9 10*3/uL (ref 0.1–0.9)
Monocytes Relative: 9 %
NEUTROS ABS: 5.5 10*3/uL (ref 1.5–6.5)
Neutrophils Relative %: 54 %
Platelet Count: 361 10*3/uL (ref 145–400)
RBC: 4.32 MIL/uL (ref 3.70–5.45)
RDW: 15.6 % — AB (ref 11.2–14.5)
WBC Count: 10.2 10*3/uL (ref 3.9–10.3)

## 2017-09-27 LAB — IRON AND TIBC
Iron: 89 ug/dL (ref 41–142)
SATURATION RATIOS: 40 % (ref 21–57)
TIBC: 220 ug/dL — ABNORMAL LOW (ref 236–444)
UIBC: 131 ug/dL

## 2017-09-27 LAB — RETICULOCYTES
RBC.: 4.32 MIL/uL (ref 3.70–5.45)
RETIC COUNT ABSOLUTE: 77.8 10*3/uL (ref 33.7–90.7)
RETIC CT PCT: 1.8 % (ref 0.7–2.1)

## 2017-09-27 LAB — FERRITIN: Ferritin: 65 ng/mL (ref 11–307)

## 2017-09-27 NOTE — Telephone Encounter (Signed)
Gave patient avs and calendar of upcoming appts.  °

## 2017-09-28 ENCOUNTER — Telehealth: Payer: Self-pay

## 2017-09-28 NOTE — Telephone Encounter (Signed)
-----   Message from Alla Feeling, NP sent at 09/28/2017  8:46 AM EDT ----- Please let her know iron levels are in good range, continue 1 tab daily.  Thanks, Regan Rakers NP

## 2017-09-28 NOTE — Telephone Encounter (Signed)
Spoke with patient about lab results, iron level are in good range, continue taking one iron supplement daily, patient verbalized an understanding.

## 2017-10-22 ENCOUNTER — Other Ambulatory Visit: Payer: Self-pay | Admitting: Internal Medicine

## 2017-11-29 ENCOUNTER — Inpatient Hospital Stay: Payer: Medicare Other | Attending: Hematology

## 2017-11-29 DIAGNOSIS — D649 Anemia, unspecified: Secondary | ICD-10-CM

## 2017-11-29 DIAGNOSIS — D508 Other iron deficiency anemias: Secondary | ICD-10-CM

## 2017-11-29 LAB — CBC WITH DIFFERENTIAL (CANCER CENTER ONLY)
Basophils Absolute: 0 10*3/uL (ref 0.0–0.1)
Basophils Relative: 1 %
Eosinophils Absolute: 0.5 10*3/uL (ref 0.0–0.5)
Eosinophils Relative: 6 %
HCT: 42.8 % (ref 34.8–46.6)
HEMOGLOBIN: 13.8 g/dL (ref 11.6–15.9)
LYMPHS ABS: 2.1 10*3/uL (ref 0.9–3.3)
LYMPHS PCT: 25 %
MCH: 30 pg (ref 25.1–34.0)
MCHC: 32.2 g/dL (ref 31.5–36.0)
MCV: 93.1 fL (ref 79.5–101.0)
MONOS PCT: 7 %
Monocytes Absolute: 0.5 10*3/uL (ref 0.1–0.9)
NEUTROS PCT: 61 %
Neutro Abs: 4.9 10*3/uL (ref 1.5–6.5)
Platelet Count: 361 10*3/uL (ref 145–400)
RBC: 4.6 MIL/uL (ref 3.70–5.45)
RDW: 15.1 % — ABNORMAL HIGH (ref 11.2–14.5)
WBC Count: 8.1 10*3/uL (ref 3.9–10.3)

## 2017-11-29 LAB — IRON AND TIBC
Iron: 123 ug/dL (ref 41–142)
SATURATION RATIOS: 53 % (ref 21–57)
TIBC: 233 ug/dL — AB (ref 236–444)
UIBC: 110 ug/dL

## 2017-11-29 LAB — FERRITIN: Ferritin: 67 ng/mL (ref 11–307)

## 2017-11-29 LAB — RETICULOCYTES
RBC.: 4.59 MIL/uL (ref 3.70–5.45)
Retic Count, Absolute: 73.4 10*3/uL (ref 33.7–90.7)
Retic Ct Pct: 1.6 % (ref 0.7–2.1)

## 2017-11-30 ENCOUNTER — Telehealth: Payer: Self-pay | Admitting: Emergency Medicine

## 2017-11-30 NOTE — Telephone Encounter (Addendum)
Detailed message left regarding this note   ----- Message from Alla Feeling, NP sent at 11/30/2017  1:36 PM EDT ----- Please let her know labs results. WBC, platelets, and Hgb all normal. Iron in good range. Continue oral iron.  Thanks, Regan Rakers NP

## 2017-12-01 ENCOUNTER — Telehealth: Payer: Self-pay

## 2017-12-01 NOTE — Telephone Encounter (Signed)
Left message for patient, lab work results show all in good range per Dr. Burr Medico, continue current oral iron.

## 2017-12-01 NOTE — Telephone Encounter (Signed)
-----   Message from Alla Feeling, NP sent at 11/30/2017  1:36 PM EDT ----- Please let her know labs results. WBC, platelets, and Hgb all normal. Iron in good range. Continue oral iron.  Thanks, Regan Rakers NP

## 2017-12-07 ENCOUNTER — Telehealth: Payer: Self-pay | Admitting: Internal Medicine

## 2017-12-07 DIAGNOSIS — E2839 Other primary ovarian failure: Secondary | ICD-10-CM

## 2017-12-07 DIAGNOSIS — G250 Essential tremor: Secondary | ICD-10-CM

## 2017-12-07 DIAGNOSIS — R413 Other amnesia: Secondary | ICD-10-CM

## 2017-12-07 NOTE — Telephone Encounter (Signed)
Please advise on referrals and once ordered I can call and schedule DEXA.

## 2017-12-07 NOTE — Telephone Encounter (Signed)
Copied from [place] [id number]. Topic: General - Other >> Dec 07, 2017  3:54 PM Carolyn Stare wrote:  Dr Royce Macadamia at [facility] told pt to req a bone density and to see a neurologist and colonoscopy. She said she does not want to see Jari Pigg she has seen him before

## 2017-12-08 NOTE — Addendum Note (Signed)
Addended by: Karren Cobble on: 12/08/2017 04:17 PM   Modules accepted: Orders

## 2017-12-08 NOTE — Telephone Encounter (Signed)
DEXA ordered, referral to neuro made.  Pt will call Dr. Royce Macadamia to see why they were wanting colonoscopy since its only been 5 years since her last one and call back

## 2017-12-09 ENCOUNTER — Encounter: Payer: Self-pay | Admitting: Neurology

## 2017-12-09 NOTE — Progress Notes (Signed)
Mallory Klein was seen today in the movement disorders clinic for neurologic consultation at the request of Plotnikov, Evie Lacks, MD.  The consultation is for the evaluation of tremor.  Records are reviewed.  The patient was seen on October 21, 2017 at orthopedics at [facility].  She was seen for back pain, but during the visit she complained about unsteady gait and tremor.  They were concerned about a possible progressive neurologic condition that would affect the outcome of possible surgery for the back.  She was therefore referred here. This patient is accompanied in the office by her spouse who supplements the history.  Tremor: Yes.     How long has it been going on? 5 years, getting worse.  When tremor first started, it started in the R hand but later progressed to involve both hands and sometimes in the feet.    At rest or with activation?  both  When is it noted the most?  Writing; eating but able to feed self  Fam hx of tremor?  Yes.  , father  Affected by caffeine:  No. (1 small cup coffee per day)  Affected by alcohol:  No. (1 glass wine 3 times per month)  Affected by stress:  No.  Affected by fatigue:  No.  Spills soup if on spoon:  No.  Spills glass of liquid if full:  No. per pt but yes per husbnd  Affects ADL's (tying shoes, brushing teeth, etc):  No.  Tremor inducing meds:  No. pcp placed her on primidone 50 mg tid.  Pt thinks that she has been on that just over a year.  Pt doesn't think that it has been helpful  Other Specific Symptoms:  Voice: yes per husband Sleep: some trouble due to back pain  Vivid Dreams:  No.  Acting out dreams:  No. Wet Pillows: Yes.   Postural symptoms:  Yes.   ,relates to back pain  Falls?  Yes.  , last 2 weeks ago getting out of the bed.  Most falls are similar to this.  Husband states that the first 3 feet that she walks are most dangerous for increased falls Bradykinesia symptoms: shuffling gait, slow movements and difficulty getting out of a  chair Loss of smell:  No. per pt but yes per husband - "her olfactory is off" Loss of taste:  No. Urinary Incontinence:  Yes.    - cannot get to bathroom fast enough.  Issue x 5+years Difficulty Swallowing:  No. Handwriting, micrographia: Yes.   Trouble with ADL's:  Yes.   - sometimes husband has to help and relates to how bad back is hurting Depression:  No. Memory changes:  Yes.  , mostly a naming issue Hallucinations:  No.  visual distortions: rarely N/V:  No. Lightheaded:  No.  Syncope: No. Diplopia:  No. Dyskinesia:  No.  Neuroimaging of the brain has not previously been performed that I can tell but she thinks that she had neuroimaging in the remote past.   PREVIOUS MEDICATIONS: none to date  ALLERGIES:   Allergies  Allergen Reactions  . Ace Inhibitors     Patient doesn't recall reaction.  jkl  . Aspirin Other (See Comments)    bruising  . Citalopram Hydrobromide Diarrhea    CURRENT MEDICATIONS:  Outpatient Encounter Medications as of 12/13/2017  Medication Sig  . albuterol (PROVENTIL HFA;VENTOLIN HFA) 108 (90 Base) MCG/ACT inhaler Inhale 2 puffs into the lungs every 6 (six) hours as needed for wheezing or shortness  of breath.  . cetirizine (ZYRTEC) 10 MG tablet Take 10 mg by mouth daily.    . Cholecalciferol (VITAMIN D3) 1000 UNITS tablet Take 2 tablets by mouth once daily   . diclofenac sodium (VOLTAREN) 1 % GEL Apply 4 g topically 4 (four) times daily. As directed. (Patient taking differently: Apply 4 g topically 4 (four) times daily as needed (for pain resulting from spinal stenosis/arthritis.). )  . fluticasone furoate-vilanterol (BREO ELLIPTA) 100-25 MCG/INH AEPB Inhale 1 puff into the lungs daily.  . montelukast (SINGULAIR) 10 MG tablet TAKE 1 TABLET BY MOUTH EVERY DAY  . primidone (MYSOLINE) 50 MG tablet TAKE 1 TABLET BY MOUTH THREE TIMES A DAY  . venlafaxine XR (EFFEXOR-XR) 150 MG 24 hr capsule TAKE 1 CAPSULE BY MOUTH EVERY DAY WITH BREAKFAST  . verapamil  (VERELAN) 120 MG 24 hr capsule Take 1 capsule (120 mg total) by mouth at bedtime.  . [DISCONTINUED] dexlansoprazole (DEXILANT) 60 MG capsule Take 1 capsule (60 mg total) by mouth daily.   No facility-administered encounter medications on file as of 12/13/2017.     PAST MEDICAL HISTORY:   Past Medical History:  Diagnosis Date  . Anxiety   . Asthma   . Depression   . Diverticulosis of colon (without mention of hemorrhage)   . Esophageal stricture   . GERD (gastroesophageal reflux disease)   . Heart murmur   . Hypertension   . Internal hemorrhoids without mention of complication   . Osteoarthritis   . Shortness of breath dyspnea    with allergies    PAST SURGICAL HISTORY:   Past Surgical History:  Procedure Laterality Date  . ABDOMINAL HYSTERECTOMY    . BREAST CYST EXCISION Left 1980's  . LUMBAR LAMINECTOMY/DECOMPRESSION MICRODISCECTOMY Bilateral 02/20/2014   Procedure: LUMBAR TWO THREE, LUMBAR THREE FOUR LUMBAR LAMINECTOMY/DECOMPRESSION MICRODISCECTOMY 2 LEVELS;  Surgeon: Eustace Moore, MD;  Location: [place];  Service: Neurosurgery;  Laterality: Bilateral;  . SPLENECTOMY    . TIBIA FRACTURE SURGERY Right   . TIBIA FRACTURE SURGERY Left   . TOTAL KNEE ARTHROPLASTY Right 2013    SOCIAL HISTORY:   Social History   Socioeconomic History  . Marital status: Married    Spouse name: Not on file  . Number of children: Not on file  . Years of education: 71  . Highest education level: Associate degree: occupational, Hotel manager, or vocational program  Occupational History  . Occupation: Retired    Comment: Network engineer, Youth worker   Social Needs  . Financial resource strain: Not on file  . Food insecurity:    Worry: Not on file    Inability: Not on file  . Transportation needs:    Medical: Not on file    Non-medical: Not on file  Tobacco Use  . Smoking status: Never Smoker  . Smokeless tobacco: Never Used  Substance and Sexual Activity  . Alcohol use: No     Alcohol/week: 2.0 standard drinks    Types: 2 Glasses of wine per week    Comment: wine on occ   . Drug use: No  . Sexual activity: Not Currently  Lifestyle  . Physical activity:    Days per week: Not on file    Minutes per session: Not on file  . Stress: Not on file  Relationships  . Social connections:    Talks on phone: Not on file    Gets together: Not on file    Attends religious service: Not on file    Active  member of club or organization: Not on file    Attends meetings of clubs or organizations: Not on file    Relationship status: Not on file  . Intimate partner violence:    Fear of current or ex partner: Not on file    Emotionally abused: Not on file    Physically abused: Not on file    Forced sexual activity: Not on file  Other Topics Concern  . Not on file  Social History Narrative   Drink tea daily and coffee on occ    FAMILY HISTORY:   Family Status  Relation Name Status  . Mother  Deceased  . Father 14 Deceased  . Daughter  Alive  . Son  Deceased       cause unknown  . Brother  Alive  . Neg Hx  (Not Specified)    ROS:  Review of Systems  Constitutional: Positive for malaise/fatigue.  HENT: Negative.   Eyes: Negative.   Respiratory: Negative.   Cardiovascular: Negative.   Gastrointestinal: Negative.   Genitourinary: Positive for dysuria and urgency.  Musculoskeletal: Positive for back pain.  Skin: Negative.   Endo/Heme/Allergies: Negative.     PHYSICAL EXAMINATION:    VITALS:   Vitals:   12/13/17 1439  BP: (!) 148/90  Pulse: 95  SpO2: 93%  Weight: 180 lb (81.6 kg)  Height: 5\' 1"  (1.549 m)    GEN:  The patient appears stated age and is in NAD. HEENT:  Normocephalic, atraumatic.  The mucous membranes are moist. The superficial temporal arteries are without ropiness or tenderness. CV:  RRR Lungs:  CTAB Neck/HEME:  There are no carotid bruits bilaterally.   MS:  There is hypertrophy of the trap mm on the left.  Head is held forward and  flexed  Neurological examination:  Orientation: The patient is alert and oriented x3. Fund of knowledge is appropriate.  Recent and remote memory are intact.  Attention and concentration are normal.    Able to name objects and repeat phrases. Cranial nerves: There is good facial symmetry. Pupils are equal round and reactive to light bilaterally. Fundoscopic exam reveals clear margins bilaterally. Extraocular muscles are intact. The visual fields are full to confrontational testing. The speech is fluent and clear. Soft palate rises symmetrically and there is no tongue deviation. Hearing is intact to conversational tone. Sensation: Sensation is intact to light and pinprick throughout (facial, trunk, extremities). Vibration is intact at the bilateral big toe. There is no extinction with double simultaneous stimulation. There is no sensory dermatomal level identified. Motor: Strength is 5/5 in the bilateral upper and lower extremities (with encouragement - has pain in the LE with MMT).   Shoulder shrug is equal and symmetric.  There is no pronator drift. Deep tendon reflexes: Deep tendon reflexes are 2/4 at the bilateral biceps, triceps, brachioradialis, 2/4 at the left patella, 0/4 at the right patella and bilateral achilles. Plantar responses are downgoing bilaterally.  Movement examination: Tone: There is normal tone in the bilateral upper extremities.  The tone in the lower extremities is normal.  Abnormal movements:  there is bilateral UE resting tremor that increases with distraction.  There is intermittent foot tremor on the right.  There is no tremor of the outstretched hands.  No intention tremor.  Mild tremor with a weight in the L hand but not the right Coordination:  There is decremation with RAM's, seen with hand opening and closing bilaterally, finger taps on the right and toe and heel taps  on the left Gait and Station: The patient requires assistance from her husband out of the seat on her  Rollator.  With the Rollator, she is flexed forward at the waist and has decreased stride length.  Without the Rollator, she shuffles and somewhat drags the right leg and has a very narrow gait.    Labs:   Chemistry      Component Value Date/Time   NA 140 02/12/2017 1521   K 4.5 02/12/2017 1521   CL 101 02/12/2017 1521   CO2 35 (H) 02/12/2017 1521   BUN 12 02/12/2017 1521   CREATININE 0.82 02/12/2017 1521      Component Value Date/Time   CALCIUM 9.0 02/12/2017 1521   ALKPHOS 72 10/09/2016 1459   AST 11 10/09/2016 1459   ALT 6 10/09/2016 1459   BILITOT 0.3 10/09/2016 1459     Lab Results  Component Value Date   TSH 0.82 02/12/2017     ASSESSMENT/PLAN:  1.  Parkinsonism.  This likely represents tremor predominant Parkinson's, but an atypical state cannot be ruled out.  Time will help differentiate.  -We discussed the diagnosis as well as pathophysiology of the disease.  We discussed treatment options as well as prognostic indicators.  Patient education was provided.  -We discussed that it used to be thought that levodopa would increase risk of melanoma but now it is believed that Parkinsons itself likely increases risk of melanoma. she is to get regular skin checks.  -Greater than 50% of the 60 minute visit was spent in counseling answering questions and talking about what to expect now as well as in the future.  We talked about medication options as well as potential future surgical options.  We talked about safety in the home.  -We decided to add carbidopa/levodopa 25/100.  1/2 tab tid x 1 wk, then 1/2 in am & noon & 1 at night for a week, then 1/2 in am &1 at noon &night for a week, then 1 po tid.  Risks, benefits, side effects and alternative therapies were discussed.  The opportunity to ask questions was given and they were answered to the best of my ability.  The patient expressed understanding and willingness to follow the outlined treatment protocols.  -I will refer the  patient [facility] Parkinson's [facility], for PT/OT.  We talked about the importance of safe, cardiovascular exercise in Parkinson's disease.  -We discussed community resources in the area including patient support groups and community exercise programs for PD and pt education was provided to the patient.  2.  Low back pain  -Discussed with her that her low back pain is independent of parkinsonism.  This really is her biggest complaint.  She is seeing [facility] and is considering surgery.  She has an appointment with pain management (Dr. Greta Doom) soon.  I would like her to get on her Parkinson's medications before she has any type of surgery.  3.  Follow up is anticipated in the next 4 months, sooner should new neurologic issues arise.  Much greater than 50% of this visit was spent in counseling and coordinating care.    Cc:  Plotnikov, Evie Lacks, MD

## 2017-12-09 NOTE — Telephone Encounter (Signed)
DEXA scheduled, neuro already called patient and set up appointment, she is still trying to get in touch with dr. Royce Macadamia

## 2017-12-13 ENCOUNTER — Ambulatory Visit: Payer: Medicare Other | Admitting: Neurology

## 2017-12-13 ENCOUNTER — Encounter: Payer: Self-pay | Admitting: Neurology

## 2017-12-13 VITALS — BP 148/90 | HR 95 | Ht 61.0 in | Wt 180.0 lb

## 2017-12-13 DIAGNOSIS — M5441 Lumbago with sciatica, right side: Secondary | ICD-10-CM

## 2017-12-13 DIAGNOSIS — G20 Parkinson's disease: Secondary | ICD-10-CM

## 2017-12-13 DIAGNOSIS — G8929 Other chronic pain: Secondary | ICD-10-CM | POA: Diagnosis not present

## 2017-12-13 MED ORDER — CARBIDOPA-LEVODOPA 25-100 MG PO TABS: 1.0000 | ORAL_TABLET | Freq: Three times a day (TID) | ORAL | 3 refills | Status: DC

## 2017-12-13 NOTE — Patient Instructions (Addendum)
Start Carbidopa Levodopa as follows:  Take 1/2 tablet three times daily, at least 30 minutes before meals, for one week  Then take 1/2 tablet in the morning, 1/2 tablet in the afternoon, 1 tablet in the evening, at least 30 minutes before meals, for one week  Then take 1/2 tablet in the morning, 1 tablet in the afternoon, 1 tablet in the evening, at least 30 minutes before meals, for one week  Then take 1 tablet three times daily, at least 30 minutes before meals  You have been referred to [facility] for physical and occupational therapy. They will call you directly to schedule an appointment.  Please call [phone] if you do not hear from them.

## 2017-12-15 ENCOUNTER — Other Ambulatory Visit: Payer: Self-pay | Admitting: *Deleted

## 2017-12-15 DIAGNOSIS — G20 Parkinson's disease: Secondary | ICD-10-CM

## 2017-12-15 DIAGNOSIS — G8929 Other chronic pain: Secondary | ICD-10-CM

## 2017-12-15 DIAGNOSIS — M5441 Lumbago with sciatica, right side: Secondary | ICD-10-CM

## 2017-12-15 NOTE — Progress Notes (Unsigned)
amb  

## 2017-12-20 ENCOUNTER — Other Ambulatory Visit: Payer: Medicare Other

## 2017-12-20 ENCOUNTER — Ambulatory Visit (INDEPENDENT_AMBULATORY_CARE_PROVIDER_SITE_OTHER)
Admission: RE | Admit: 2017-12-20 | Discharge: 2017-12-20 | Disposition: A | Discharge status: Home / Self Care | Payer: Medicare Other | Source: Ambulatory Visit | Attending: Internal Medicine | Admitting: Internal Medicine

## 2017-12-20 ENCOUNTER — Ambulatory Visit: Payer: Medicare Other | Admitting: Internal Medicine

## 2017-12-20 ENCOUNTER — Encounter: Payer: Self-pay | Admitting: Internal Medicine

## 2017-12-20 VITALS — BP 144/86 | HR 68 | Temp 98.4°F | Ht 61.0 in | Wt 180.0 lb

## 2017-12-20 DIAGNOSIS — M5440 Lumbago with sciatica, unspecified side: Secondary | ICD-10-CM

## 2017-12-20 DIAGNOSIS — E2839 Other primary ovarian failure: Secondary | ICD-10-CM

## 2017-12-20 DIAGNOSIS — D500 Iron deficiency anemia secondary to blood loss (chronic): Secondary | ICD-10-CM | POA: Diagnosis not present

## 2017-12-20 DIAGNOSIS — I10 Essential (primary) hypertension: Secondary | ICD-10-CM

## 2017-12-20 DIAGNOSIS — G8929 Other chronic pain: Secondary | ICD-10-CM

## 2017-12-20 DIAGNOSIS — G20 Parkinson's disease: Secondary | ICD-10-CM

## 2017-12-20 DIAGNOSIS — Z23 Encounter for immunization: Secondary | ICD-10-CM

## 2017-12-20 MED ORDER — GABAPENTIN 300 MG PO CAPS: 300.0000 mg | ORAL_CAPSULE | Freq: Three times a day (TID) | ORAL | 5 refills | Status: DC

## 2017-12-20 NOTE — Assessment & Plan Note (Addendum)
Discussed   [facility]: injections BDS Start Gabapentin

## 2017-12-20 NOTE — Assessment & Plan Note (Signed)
Iron

## 2017-12-20 NOTE — Assessment & Plan Note (Signed)
Parkinson's: [facility] IR. Dr [name]

## 2017-12-20 NOTE — Progress Notes (Signed)
Subjective:  Patient ID: Mallory Klein, female    DOB: 03/30/1945  Age: 72 y.o. MRN: [id number]  CC: No chief complaint on file.   HPI [name] presents for LBP - severe, tremors, OA f/u  Outpatient Medications Prior to Visit  Medication Sig Dispense Refill  . albuterol (PROVENTIL HFA;VENTOLIN HFA) 108 (90 Base) MCG/ACT inhaler Inhale 2 puffs into the lungs every 6 (six) hours as needed for wheezing or shortness of breath. 1 Inhaler 6  . carbidopa-levodopa (SINEMET IR) 25-100 MG tablet Take 1 tablet by mouth 3 (three) times daily. 90 tablet 3  . cetirizine (ZYRTEC) 10 MG tablet Take 10 mg by mouth daily.      . Cholecalciferol (VITAMIN D3) 1000 UNITS tablet Take 2 tablets by mouth once daily     . diclofenac sodium (VOLTAREN) 1 % GEL Apply 4 g topically 4 (four) times daily. As directed. (Patient taking differently: Apply 4 g topically 4 (four) times daily as needed (for pain resulting from spinal stenosis/arthritis.). ) 200 g 3  . fluticasone furoate-vilanterol (BREO ELLIPTA) 100-25 MCG/INH AEPB Inhale 1 puff into the lungs daily.    . montelukast (SINGULAIR) 10 MG tablet TAKE 1 TABLET BY MOUTH EVERY DAY 90 tablet 1  . primidone (MYSOLINE) 50 MG tablet TAKE 1 TABLET BY MOUTH THREE TIMES A DAY 270 tablet 1  . venlafaxine XR (EFFEXOR-XR) 150 MG 24 hr capsule TAKE 1 CAPSULE BY MOUTH EVERY DAY WITH BREAKFAST 90 capsule 3  . verapamil (VERELAN) 120 MG 24 hr capsule Take 1 capsule (120 mg total) by mouth at bedtime. 30 capsule 11   No facility-administered medications prior to visit.     ROS: Review of Systems  Constitutional: Positive for fatigue. Negative for activity change, appetite change, chills and unexpected weight change.  HENT: Negative for congestion, mouth sores and sinus pressure.   Eyes: Negative for visual disturbance.  Respiratory: Negative for cough and chest tightness.   Gastrointestinal: Negative for abdominal pain and nausea.  Genitourinary: Negative for  difficulty urinating, frequency and vaginal pain.  Musculoskeletal: Positive for back pain, gait problem and neck pain.  Skin: Negative for pallor and rash.  Neurological: Positive for tremors and weakness. Negative for dizziness, numbness and headaches.  Psychiatric/Behavioral: Positive for dysphoric mood. Negative for confusion, sleep disturbance and suicidal ideas. The patient is nervous/anxious.   stress w/dtr  Objective:  BP (!) 144/86 (BP Location: Right Arm, Patient Position: Sitting, Cuff Size: Normal)   Pulse 68   Temp 98.4 F (36.9 C) (Oral)   Ht 5\' 1"  (1.549 m)   Wt 180 lb (81.6 kg)   SpO2 95%   BMI 34.01 kg/m   BP Readings from Last 3 Encounters:  12/20/17 (!) 144/86  12/13/17 (!) 148/90  09/27/17 123/75    Wt Readings from Last 3 Encounters:  12/20/17 180 lb (81.6 kg)  12/13/17 180 lb (81.6 kg)  09/27/17 181 lb 11.2 oz (82.4 kg)    Physical Exam  Constitutional: She appears well-developed. No distress.  HENT:  Head: Normocephalic.  Right Ear: External ear normal.  Left Ear: External ear normal.  Nose: Nose normal.  Mouth/Throat: Oropharynx is clear and moist.  Eyes: Pupils are equal, round, and reactive to light. Conjunctivae are normal. Right eye exhibits no discharge. Left eye exhibits no discharge.  Neck: Normal range of motion. Neck supple. No JVD present. No tracheal deviation present. No thyromegaly present.  Cardiovascular: Normal rate, regular rhythm and normal heart sounds.  Pulmonary/Chest:  No stridor. No respiratory distress. She has no wheezes.  Abdominal: Soft. Bowel sounds are normal. She exhibits no distension and no mass. There is no tenderness. There is no rebound and no guarding.  Musculoskeletal: She exhibits tenderness. She exhibits no edema.  Lymphadenopathy:    She has no cervical adenopathy.  Neurological: She displays abnormal reflex. No cranial nerve deficit. She exhibits normal muscle tone. Coordination abnormal.  Skin: No rash  noted. No erythema.  Psychiatric: Her behavior is normal. Judgment and thought content normal.  in a w/c LS tender  Lab Results  Component Value Date   WBC 8.1 11/29/2017   HGB 13.8 11/29/2017   HCT 42.8 11/29/2017   PLT 361 11/29/2017   GLUCOSE 89 02/12/2017   CHOL 219 (H) 02/05/2015   TRIG 69.0 02/05/2015   HDL 73.70 02/05/2015   LDLDIRECT 109.8 02/10/2010   LDLCALC 132 (H) 02/05/2015   ALT 6 10/09/2016   AST 11 10/09/2016   NA 140 02/12/2017   K 4.5 02/12/2017   CL 101 02/12/2017   CREATININE 0.82 02/12/2017   BUN 12 02/12/2017   CO2 35 (H) 02/12/2017   TSH 0.82 02/12/2017   INR 0.97 02/07/2014   HGBA1C 6.2 02/12/2017    Dg Chest 2 View  Result Date: 05/13/2016 CLINICAL DATA:  Intermittent shortness of Breath. EXAM: CHEST  2 VIEW COMPARISON:  09/22/2015 FINDINGS: Heart is borderline in size. Lingular scarring, stable. Right lung is clear. No effusions or acute bony abnormality. IMPRESSION: Borderline cardiomegaly.  Lingular scarring.  No active disease. Electronically Signed   By: Rolm Baptise M.D.   On: 05/13/2016 08:06    Assessment & Plan:   There are no diagnoses linked to this encounter.   No orders of the defined types were placed in this encounter.    Follow-up: No follow-ups on file.  Walker Kehr, MD

## 2017-12-20 NOTE — Assessment & Plan Note (Signed)
Verapamil 

## 2017-12-20 NOTE — Addendum Note (Signed)
Addended by: Karren Cobble on: 12/20/2017 03:23 PM   Modules accepted: Orders

## 2018-01-11 ENCOUNTER — Other Ambulatory Visit: Payer: Self-pay | Admitting: Internal Medicine

## 2018-01-24 ENCOUNTER — Inpatient Hospital Stay: Payer: Medicare Other | Attending: Hematology

## 2018-01-24 ENCOUNTER — Ambulatory Visit: Payer: Medicare Other | Admitting: Internal Medicine

## 2018-01-24 DIAGNOSIS — D508 Other iron deficiency anemias: Secondary | ICD-10-CM | POA: Diagnosis present

## 2018-01-24 DIAGNOSIS — D649 Anemia, unspecified: Secondary | ICD-10-CM

## 2018-01-24 LAB — CBC WITH DIFFERENTIAL (CANCER CENTER ONLY)
Abs Immature Granulocytes: 0.05 10*3/uL (ref 0.00–0.07)
Basophils Absolute: 0 10*3/uL (ref 0.0–0.1)
Basophils Relative: 0 %
EOS PCT: 3 %
Eosinophils Absolute: 0.4 10*3/uL (ref 0.0–0.5)
HCT: 42.9 % (ref 36.0–46.0)
HEMOGLOBIN: 13.8 g/dL (ref 12.0–15.0)
Immature Granulocytes: 0 %
Lymphocytes Relative: 19 %
Lymphs Abs: 2.2 10*3/uL (ref 0.7–4.0)
MCH: 29.7 pg (ref 26.0–34.0)
MCHC: 32.2 g/dL (ref 30.0–36.0)
MCV: 92.5 fL (ref 80.0–100.0)
MONO ABS: 1 10*3/uL (ref 0.1–1.0)
Monocytes Relative: 9 %
Neutro Abs: 8 10*3/uL — ABNORMAL HIGH (ref 1.7–7.7)
Neutrophils Relative %: 69 %
Platelet Count: 379 10*3/uL (ref 150–400)
RBC: 4.64 MIL/uL (ref 3.87–5.11)
RDW: 15.6 % — ABNORMAL HIGH (ref 11.5–15.5)
WBC: 11.7 10*3/uL — AB (ref 4.0–10.5)
nRBC: 0 % (ref 0.0–0.2)

## 2018-01-24 LAB — IRON AND TIBC
IRON: 77 ug/dL (ref 41–142)
Saturation Ratios: 29 % (ref 21–57)
TIBC: 264 ug/dL (ref 236–444)
UIBC: 187 ug/dL (ref 120–384)

## 2018-01-24 LAB — RETICULOCYTES
Immature Retic Fract: 19.1 % — ABNORMAL HIGH (ref 2.3–15.9)
RBC.: 4.64 MIL/uL (ref 3.87–5.11)
RETIC CT PCT: 1.6 % (ref 0.4–3.1)
Retic Count, Absolute: 71.9 10*3/uL (ref 19.0–186.0)

## 2018-01-24 LAB — FERRITIN: FERRITIN: 76 ng/mL (ref 11–307)

## 2018-01-26 ENCOUNTER — Other Ambulatory Visit: Payer: Self-pay | Admitting: Internal Medicine

## 2018-01-31 ENCOUNTER — Telehealth: Payer: Self-pay

## 2018-01-31 NOTE — Telephone Encounter (Signed)
Per Lacie Pt called explained lab results were good to continue taking iron, and to follow up with Dr. Burr Medico in February as planned. Told to call if any concerns before this time. Pt. Verbalized understanding. No further problems or concerns noted.

## 2018-02-09 ENCOUNTER — Other Ambulatory Visit: Payer: Self-pay | Admitting: Internal Medicine

## 2018-02-10 DIAGNOSIS — M47816 Spondylosis without myelopathy or radiculopathy, lumbar region: Secondary | ICD-10-CM | POA: Insufficient documentation

## 2018-02-28 ENCOUNTER — Encounter (HOSPITAL_COMMUNITY): Payer: Self-pay | Admitting: Emergency Medicine

## 2018-02-28 ENCOUNTER — Other Ambulatory Visit: Payer: Self-pay

## 2018-02-28 ENCOUNTER — Emergency Department (HOSPITAL_COMMUNITY)
Admission: EM | Admit: 2018-02-28 | Discharge: 2018-02-28 | Disposition: A | Discharge status: Home / Self Care | Payer: Medicare Other | Attending: Emergency Medicine | Admitting: Emergency Medicine

## 2018-02-28 ENCOUNTER — Telehealth: Payer: Self-pay

## 2018-02-28 ENCOUNTER — Emergency Department (HOSPITAL_COMMUNITY): Payer: Medicare Other

## 2018-02-28 DIAGNOSIS — J45909 Unspecified asthma, uncomplicated: Secondary | ICD-10-CM | POA: Insufficient documentation

## 2018-02-28 DIAGNOSIS — Z79899 Other long term (current) drug therapy: Secondary | ICD-10-CM | POA: Insufficient documentation

## 2018-02-28 DIAGNOSIS — K625 Hemorrhage of anus and rectum: Secondary | ICD-10-CM | POA: Insufficient documentation

## 2018-02-28 DIAGNOSIS — I10 Essential (primary) hypertension: Secondary | ICD-10-CM | POA: Insufficient documentation

## 2018-02-28 DIAGNOSIS — Z96651 Presence of right artificial knee joint: Secondary | ICD-10-CM | POA: Diagnosis not present

## 2018-02-28 DIAGNOSIS — R109 Unspecified abdominal pain: Secondary | ICD-10-CM | POA: Diagnosis present

## 2018-02-28 DIAGNOSIS — K649 Unspecified hemorrhoids: Secondary | ICD-10-CM

## 2018-02-28 LAB — COMPREHENSIVE METABOLIC PANEL
ALT: 13 U/L (ref 0–44)
AST: 13 U/L — ABNORMAL LOW (ref 15–41)
Albumin: 3.5 g/dL (ref 3.5–5.0)
Alkaline Phosphatase: 49 U/L (ref 38–126)
Anion gap: 7 (ref 5–15)
BUN: 21 mg/dL (ref 8–23)
CO2: 31 mmol/L (ref 22–32)
Calcium: 8.7 mg/dL — ABNORMAL LOW (ref 8.9–10.3)
Chloride: 104 mmol/L (ref 98–111)
Creatinine, Ser: 0.88 mg/dL (ref 0.44–1.00)
GFR calc Af Amer: >60 mL/min (ref >60)
GFR calc non Af Amer: >60 mL/min (ref >60)
Glucose, Bld: 101 mg/dL — ABNORMAL HIGH (ref 70–99)
Potassium: 4.2 mmol/L (ref 3.5–5.1)
Sodium: 142 mmol/L (ref 135–145)
Total Bilirubin: 0.6 mg/dL (ref 0.3–1.2)
Total Protein: 6.8 g/dL (ref 6.5–8.1)

## 2018-02-28 LAB — CBC
HCT: 40.2 % (ref 36.0–46.0)
Hemoglobin: 12.4 g/dL (ref 12.0–15.0)
MCH: 29.5 pg (ref 26.0–34.0)
MCHC: 30.8 g/dL (ref 30.0–36.0)
MCV: 95.5 fL (ref 80.0–100.0)
Platelets: 317 10*3/uL (ref 150–400)
RBC: 4.21 MIL/uL (ref 3.87–5.11)
RDW: 16.3 % — ABNORMAL HIGH (ref 11.5–15.5)
WBC: 11.4 10*3/uL — ABNORMAL HIGH (ref 4.0–10.5)
nRBC: 0 % (ref 0.0–0.2)

## 2018-02-28 LAB — TYPE AND SCREEN
ABO/RH(D): A POS
Antibody Screen: NEGATIVE

## 2018-02-28 LAB — POC OCCULT BLOOD, ED: Fecal Occult Bld: POSITIVE — AB

## 2018-02-28 LAB — ABO/RH: ABO/RH(D): A POS

## 2018-02-28 IMAGING — CT CT ABD-PELV W/ CM
2 of 5 series · 16 of 46 positions shown, 18 images · IV contrast (ISOVUE)
Comparison: None.

CLINICAL DATA: Abdominal pain

EXAM:
CT ABDOMEN AND PELVIS WITH CONTRAST
TECHNIQUE: Multidetector CT imaging of the abdomen and pelvis was performed
using the standard protocol following bolus administration of
intravenous contrast.
CONTRAST:  100mL [0P] IOPAMIDOL ([0P]) INJECTION 61%

[Series 2: axial st · axial · 0.69mm/px · z∈[+914,+1284]mm · 13 of 86 slices shown, 15 images]
[im 6/86  soft-tissue]
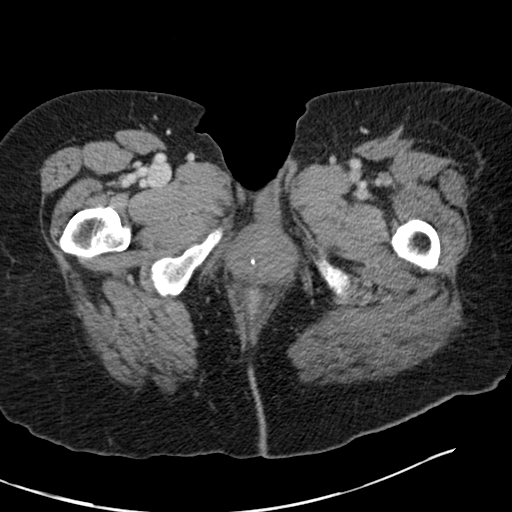
[im 6/86  bone]
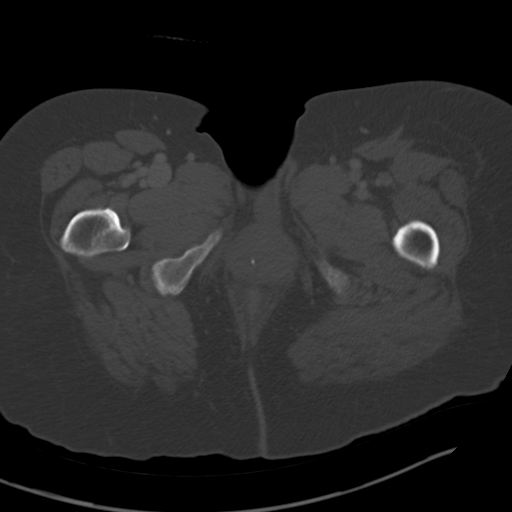
[im 12/86  soft-tissue]
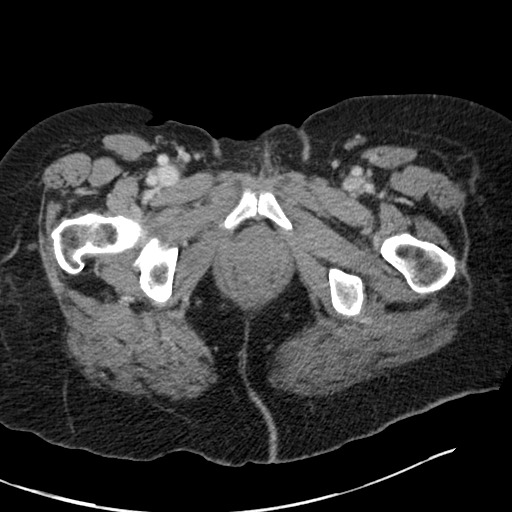
[im 18/86  soft-tissue]
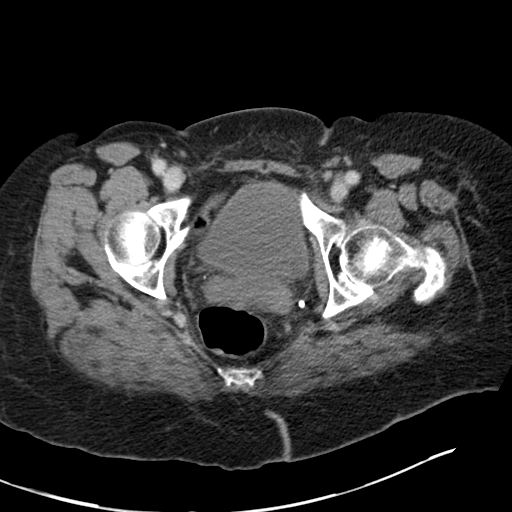
[im 23/86  soft-tissue]
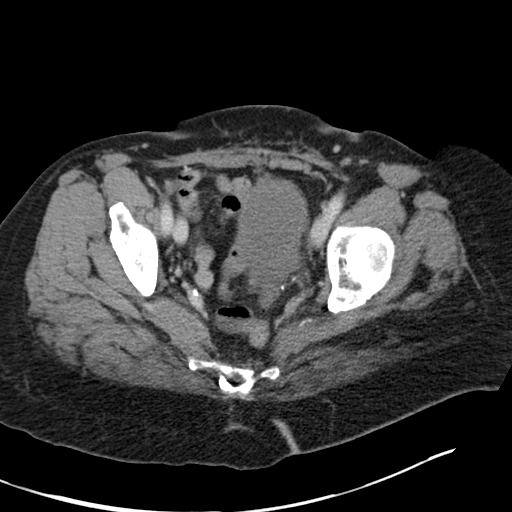
[im 29/86  soft-tissue]
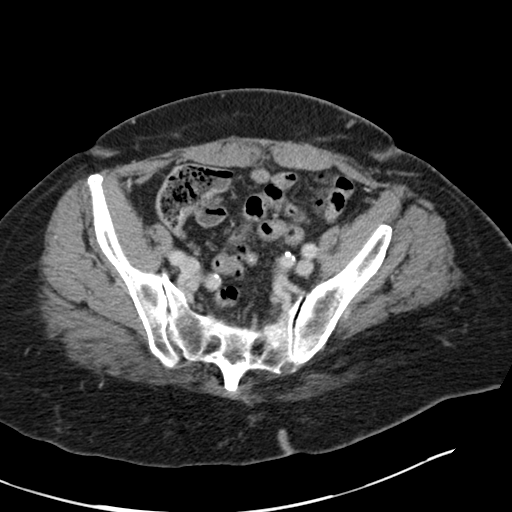
[im 35/86  soft-tissue]
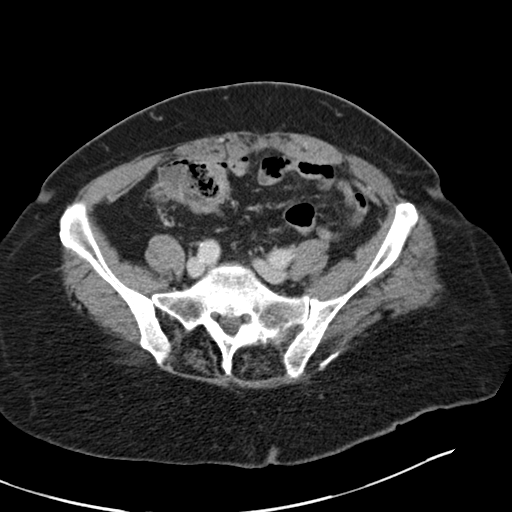
[im 46/86  soft-tissue]
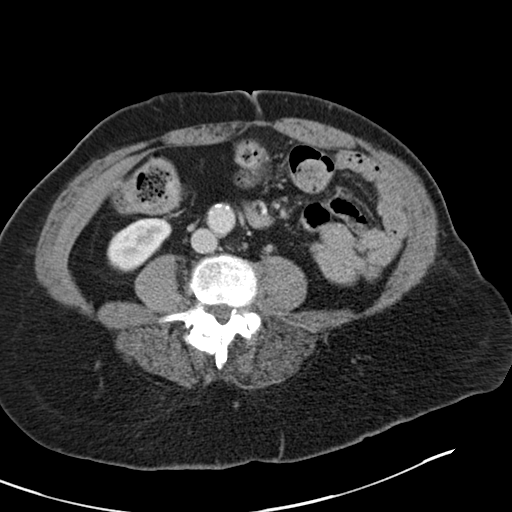
[im 52/86  soft-tissue]
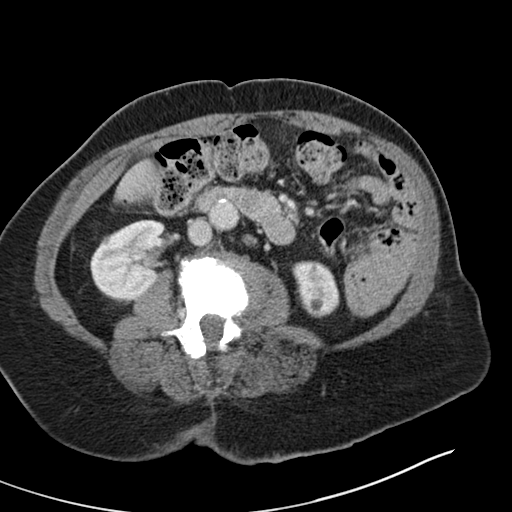
[im 57/86  soft-tissue]
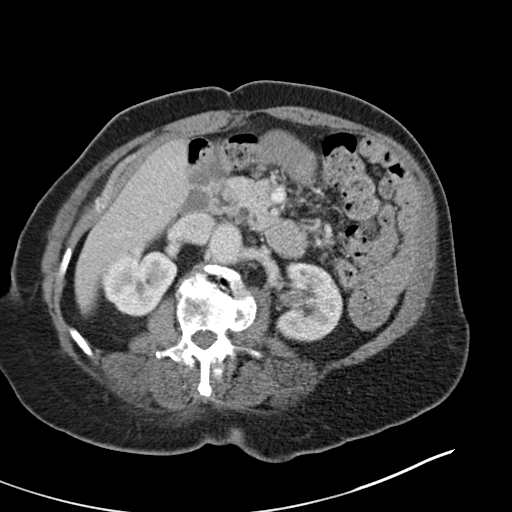
[im 57/86  bone]
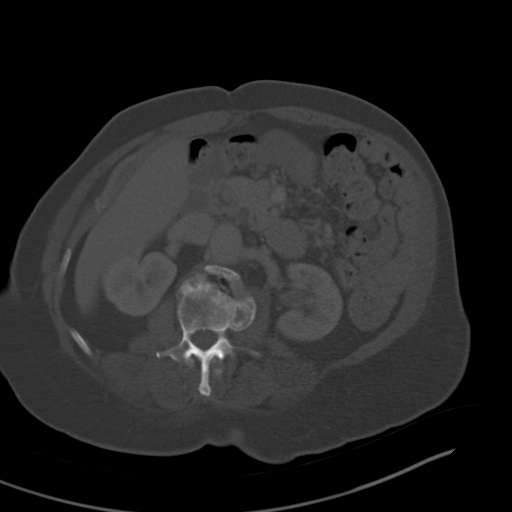
[im 63/86  soft-tissue]
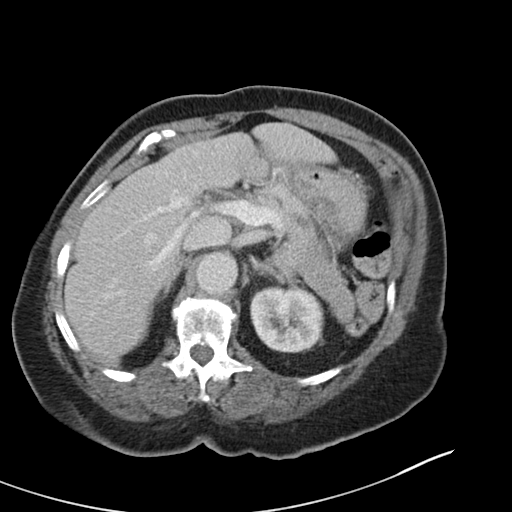
[im 69/86  soft-tissue]
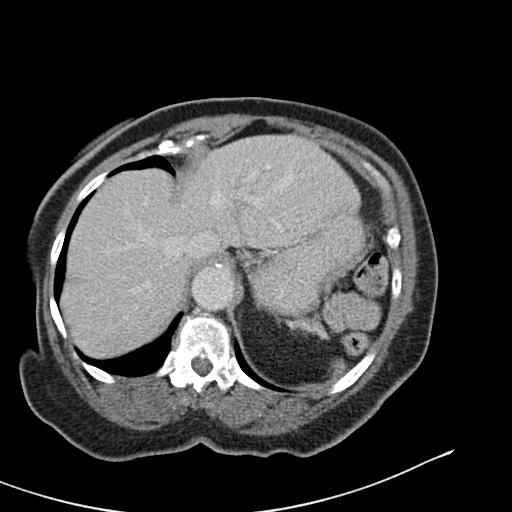
[im 74/86  soft-tissue]
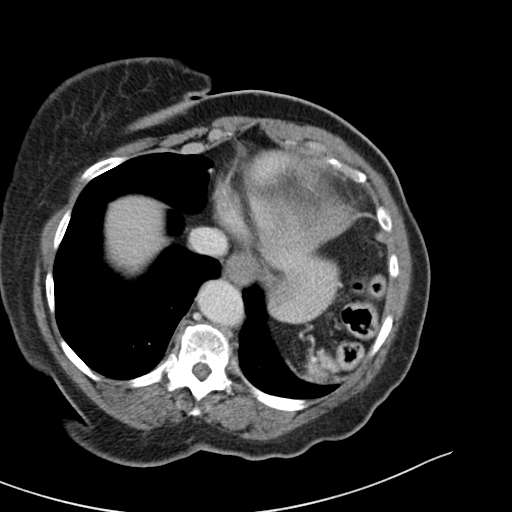
[im 80/86  soft-tissue]
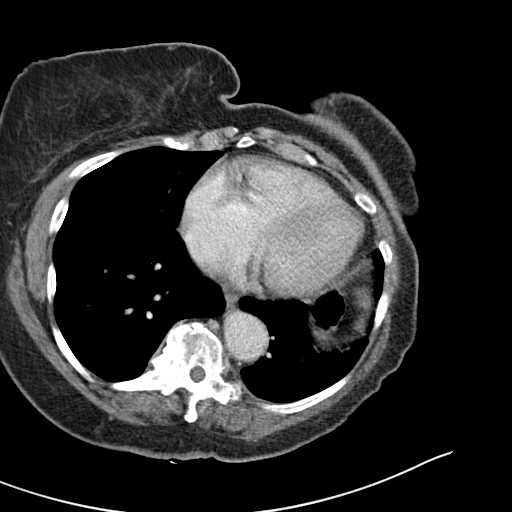

[Series 5: coronal st · coronal · 0.63mm/px · 3 of 70 slices shown]
[im 24/70  soft-tissue]
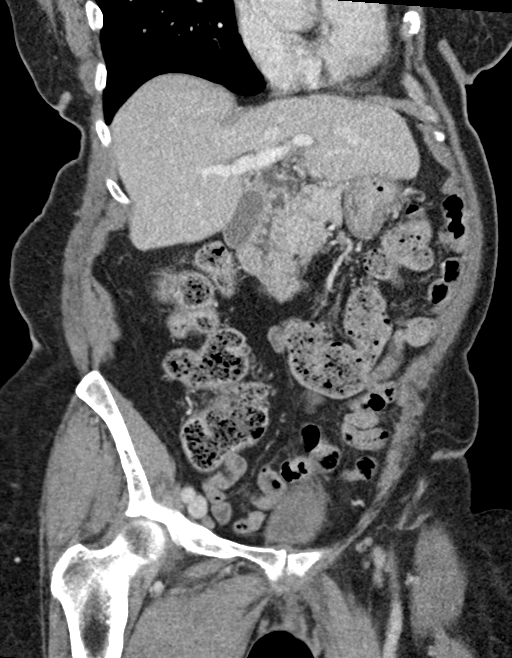
[im 31/70  soft-tissue]
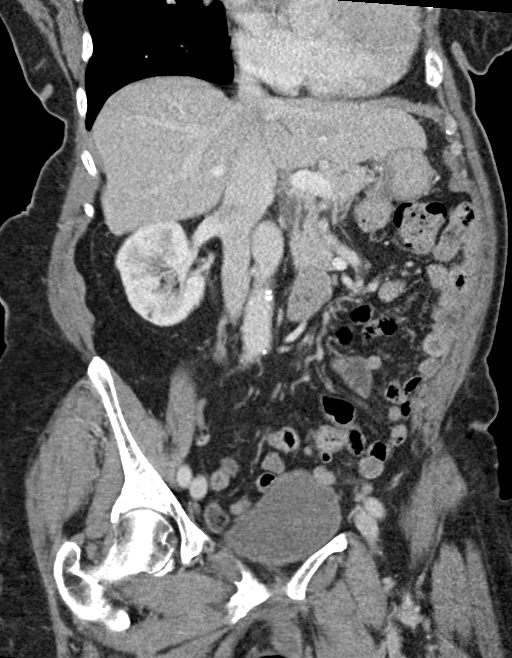
[im 39/70  soft-tissue]
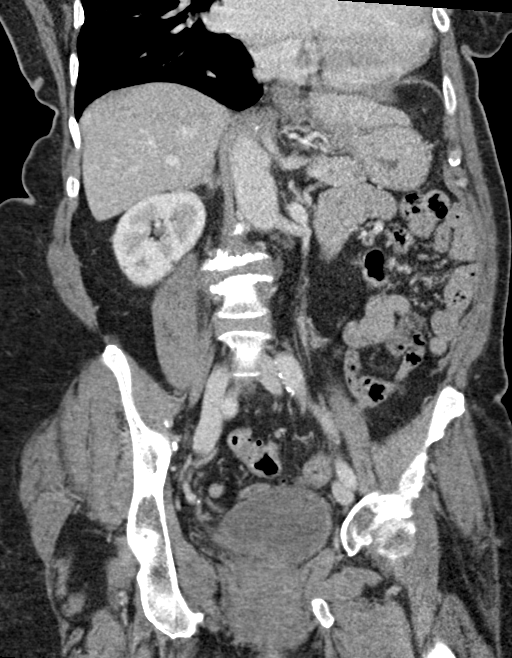

[16 of 46 positions shown; findings below may reference images not displayed]

FINDINGS: LOWER CHEST: There is no basilar pleural or apical pericardial
effusion.

HEPATOBILIARY: The hepatic contours and density are normal. There is
no intra- or extrahepatic biliary dilatation. The gallbladder is
normal.

PANCREAS: The pancreatic parenchymal contours are normal and there
is no ductal dilatation. There is no peripancreatic fluid
collection.

SPLEEN: Normal.

ADRENALS/URINARY TRACT:

--Adrenal glands: Normal.

--Right kidney/ureter: No hydronephrosis, nephroureterolithiasis,
perinephric stranding or solid renal mass.

--Left kidney/ureter: No hydronephrosis, nephroureterolithiasis,
perinephric stranding or solid renal mass.

--Urinary bladder: Normal for degree of distention

STOMACH/BOWEL:

--Stomach/Duodenum: There is no hiatal hernia or other gastric
abnormality. The duodenal course and caliber are normal.

--Small bowel: No dilatation or inflammation.

--Colon: No focal abnormality.

--Appendix: Normal.

VASCULAR/LYMPHATIC: There is aortic atherosclerosis without
hemodynamically significant stenosis. The portal vein, splenic vein,
superior mesenteric vein and IVC are patent. No abdominal or pelvic
lymphadenopathy.

REPRODUCTIVE: Status post hysterectomy. No adnexal mass.

MUSCULOSKELETAL. Multilevel degenerative disc disease and facet
arthrosis. No bony spinal canal stenosis.

OTHER: None.
IMPRESSION: No acute abdominal or pelvic abnormality.

## 2018-02-28 MED ORDER — IOPAMIDOL (ISOVUE-300) INJECTION 61%
INTRAVENOUS | Status: AC
  Filled 2018-02-28: qty 100

## 2018-02-28 MED ORDER — IOPAMIDOL (ISOVUE-300) INJECTION 61%
100.0000 mL | Freq: Once | INTRAVENOUS | Status: AC | PRN
  Administered 2018-02-28: 100 mL via INTRAVENOUS

## 2018-02-28 MED ORDER — SODIUM CHLORIDE (PF) 0.9 % IJ SOLN
INTRAMUSCULAR | Status: AC
  Filled 2018-02-28: qty 50

## 2018-02-28 NOTE — ED Notes (Signed)
Pt spouse came out the room and stated that his wife wants to be disconnected from all equipment and that they are going to leave.

## 2018-02-28 NOTE — ED Notes (Signed)
Assisted provider with occult stool sample.

## 2018-02-28 NOTE — ED Notes (Signed)
Pt spouse states that he has brought food for his wife, and that she wants to eat. Pt spouse made aware that test are pending and that after they result we can see if she is able to eat, Radio broadcast assistant spoke with family and stated that she would let the provider know that the family wanted an update.

## 2018-02-28 NOTE — Discharge Instructions (Signed)
Continue to take your Iron Pills.  As we discussed today, your hemoglobin level is normal.  I suspect your rectal bleeding may be due to hemorrhoids.  Please follow-up with your GI doctor.  Call their office and arrange for an appointment.  Return the emergency department for any abdominal pain, worsening rectal bleeding, feeling lightheadedness or episodes of passing out, chest pain or difficulty breathing.

## 2018-02-28 NOTE — Telephone Encounter (Signed)
Patient calls stating that since Friday she has had daily episodes of rectal bleeding, this morning's being the worst, instructed her to call her PCP or GI doctor that she needs to be seen today.  If they are unable to see her she needs to go to [facility] Emergency Department, patient verbalized an understanding and will follow these instructions, Dr. Burr Medico made aware.

## 2018-02-28 NOTE — ED Triage Notes (Signed)
Pt reports that since Friday when has BM, has darker red clots. Reports taking iron but not on blood thinners. Denies pains that relate to rectal bleeding.

## 2018-02-28 NOTE — ED Provider Notes (Signed)
[facility] DEPT Provider Note   CSN: [id number] Arrival date & time: 02/28/18  1401     History   Chief Complaint Chief Complaint  Patient presents with  . Rectal Bleeding    HPI Mallory Klein is a 73 y.o. female past medical history of asthma, anemia, depression, diverticulosis, GERD, hypertension, internal hemorrhoids presents for evaluation of rectal bleeding that is been ongoing for the last 4 days.  Patient states she initially noticed it about 4 days ago and stated that she was having some dark red and bright red blood noted in her stools.  She did report some presence of clots with black lines but did not have any coffee ground in her stools.  Patient reports that had started to improve but then today she went to go have a bowel movement and states that she noticed a large amount of both dark and bright red blood.  She states she does not have pain with a bowel movement has not had to strain with having a bowel movement.  Patient states that she has not had any abdominal pain.  She does report that she sees [facility].  She does not know when her last colonoscopy was but states that she is not due for one in several years.  She denies any abnormal colonoscopies or family history of colon cancer.  Patient states that she does have a history of anemia and takes p.o. iron pills which she states she has been taking.  She is followed by Dr. Burr Medico (Oncology).  Patient denies any vomiting, fever, night sweats, weight loss, chest pain, difficulty breathing, bleeding from any other place. Patient is not a current smoker. She denies any NSAID use.   The history is provided by the patient.    Past Medical History:  Diagnosis Date  . Anxiety   . Asthma   . Depression   . Diverticulosis of colon (without mention of hemorrhage)   . Esophageal stricture   . GERD (gastroesophageal reflux disease)   . Heart murmur   . Hypertension   . Internal hemorrhoids  without mention of complication   . Osteoarthritis   . Shortness of breath dyspnea    with allergies    Patient Active Problem List   Diagnosis Date Noted  . Thrombocytosis ([place]) 05/24/2017  . Iron deficiency anemia 04/23/2017  . Cough 05/12/2016  . Memory problem 09/03/2015  . Abnormal CBC 02/05/2015  . Edema 10/15/2014  . Incontinence in female 10/15/2014  . S/P lumbar laminectomy 02/20/2014  . Pre-ulcerative calluses 02/09/2014  . Parkinson disease ([place]) 02/09/2014  . Cold sore 10/17/2013  . Low back pain 07/25/2013  . Heel ulcer ([place]) 03/08/2012  . Rash 12/04/2011  . Neck pain on left side 08/06/2011  . Grief at loss of child 08/06/2011  . Well adult exam 03/02/2011  . Hyperglycemia 03/02/2011  . Knee pain, right 09/08/2010  . OSA (obstructive sleep apnea) 02/14/2010  . SWEATING 02/14/2010  . BRONCHITIS, ACUTE 04/05/2009  . VERTIGO 12/02/2007  . ABNORMAL GLUCOSE NEC 12/02/2007  . OTITIS MEDIA 10/26/2007  . HEADACHE 10/26/2007  . OTHER MALAISE AND FATIGUE 05/11/2007  . Anxiety state 02/15/2007  . Adjustment disorder with mixed anxiety and depressed mood 02/15/2007  . GERD 02/15/2007  . Osteoarthritis 02/15/2007  . POSTHERPETIC TRIGEMINAL NEURALGIA 01/26/2007  . HERPES ZOSTER 01/26/2007  . OBESITY, MORBID 01/26/2007  . MIGRAINE HEADACHE 01/26/2007  . Essential hypertension 01/26/2007  . Asthma 01/26/2007  . Urinary incontinence 01/26/2007  Past Surgical History:  Procedure Laterality Date  . ABDOMINAL HYSTERECTOMY    . BREAST CYST EXCISION Left 1980's  . LUMBAR LAMINECTOMY/DECOMPRESSION MICRODISCECTOMY Bilateral 02/20/2014   Procedure: LUMBAR TWO THREE, LUMBAR THREE FOUR LUMBAR LAMINECTOMY/DECOMPRESSION MICRODISCECTOMY 2 LEVELS;  Surgeon: Eustace [facility], MD;  Location: [facility];  Service: Neurosurgery;  Laterality: Bilateral;  . SPLENECTOMY    . TIBIA FRACTURE SURGERY Right   . TIBIA FRACTURE SURGERY Left   . TOTAL KNEE ARTHROPLASTY Right 2013     OB  History   No obstetric history on file.      Home Medications    Prior to Admission medications   Medication Sig Start Date End Date Taking? Authorizing Provider  albuterol (PROVENTIL HFA;VENTOLIN HFA) 108 (90 Base) MCG/ACT inhaler Inhale 2 puffs into the lungs every 6 (six) hours as needed for wheezing or shortness of breath. 11/16/16  Yes Chesley Mires, MD  cetirizine (ZYRTEC) 10 MG tablet Take 10 mg by mouth daily.     Yes [provider]  gabapentin (NEURONTIN) 300 MG capsule TAKE 1 CAPSULE BY MOUTH THREE TIMES A DAY Patient taking differently: Take 300 mg by mouth 3 (three) times daily as needed.  01/11/18  Yes Plotnikov, Evie Lacks, MD  IRON PO Take 1 tablet by mouth daily.   Yes [provider]  montelukast (SINGULAIR) 10 MG tablet TAKE 1 TABLET BY MOUTH EVERY DAY 01/26/18  Yes Plotnikov, Evie Lacks, MD  primidone (MYSOLINE) 50 MG tablet TAKE 1 TABLET BY MOUTH THREE TIMES A DAY 10/22/17  Yes Plotnikov, Evie Lacks, MD  venlafaxine XR (EFFEXOR-XR) 150 MG 24 hr capsule TAKE 1 CAPSULE BY MOUTH EVERY DAY WITH BREAKFAST Patient taking differently: Take 150 mg by mouth daily with breakfast.  08/02/17  Yes Plotnikov, Evie Lacks, MD  verapamil (VERELAN) 120 MG 24 hr capsule Take 1 capsule (120 mg total) by mouth at bedtime. 09/22/17  Yes Plotnikov, Evie Lacks, MD  carbidopa-levodopa (SINEMET IR) 25-100 MG tablet Take 1 tablet by mouth 3 (three) times daily. Patient not taking: Reported on 02/28/2018 12/13/17   Tat, Eustace Quail, DO  Cholecalciferol (VITAMIN D3) 1000 UNITS tablet Take 2 tablets by mouth once daily     [provider]  diclofenac sodium (VOLTAREN) 1 % GEL Apply 4 g topically 4 (four) times daily. As directed. Patient not taking: Reported on 02/28/2018 04/25/13   Plotnikov, Evie Lacks, MD    Family History Family History  Problem Relation Age of Onset  . Hypertension Mother   . Diabetes type II Mother   . Asthma Mother   . Diabetes Mother   . Dementia Mother   .  Prostate cancer Father   . Mental illness Father        dementia  . Suicidality Son   . Stroke Brother        died in prison  . Colon cancer Neg Hx     Social History Social History   Tobacco Use  . Smoking status: Never Smoker  . Smokeless tobacco: Never Used  Substance Use Topics  . Alcohol use: No    Alcohol/week: 2.0 standard drinks    Types: 2 Glasses of wine per week    Comment: wine on occ   . Drug use: No     Allergies   Ace inhibitors; Aspirin; and Citalopram hydrobromide   Review of Systems Review of Systems  Constitutional: Negative for fever.  Respiratory: Negative for cough and shortness of breath.   Cardiovascular: Negative  for chest pain.  Gastrointestinal: Positive for blood in stool. Negative for abdominal pain, constipation, diarrhea, nausea and vomiting.  Genitourinary: Negative for dysuria and hematuria.  Neurological: Negative for headaches.  All other systems reviewed and are negative.    Physical Exam Updated Vital Signs BP 119/66 (BP Location: Right Arm)   Pulse 73   Temp 98.3 F (36.8 C) (Oral)   Resp 16   Ht 5\' 2"  (1.575 m)   Wt 72.6 kg   SpO2 100%   BMI 29.26 kg/m   Physical Exam Vitals signs and nursing note reviewed. Exam conducted with a chaperone present.  Constitutional:      Appearance: Normal appearance. She is well-developed.  HENT:     Head: Normocephalic and atraumatic.  Eyes:     General: Lids are normal.     Conjunctiva/sclera: Conjunctivae normal.     Pupils: Pupils are equal, round, and reactive to light.  Neck:     Musculoskeletal: Full passive range of motion without pain.  Cardiovascular:     Rate and Rhythm: Normal rate and regular rhythm.     Pulses: Normal pulses.     Heart sounds: Normal heart sounds. No murmur. No friction rub. No gallop.   Pulmonary:     Effort: Pulmonary effort is normal.     Breath sounds: Normal breath sounds.     Comments: Lungs clear to auscultation bilaterally.  Symmetric  chest rise.  No wheezing, rales, rhonchi. Abdominal:     Palpations: Abdomen is soft. Abdomen is not rigid.     Tenderness: There is no abdominal tenderness. There is no guarding.     Comments: Abdomen is soft, non-distended, non-tender. No rigidity, No guarding. No peritoneal signs.  Genitourinary:    Rectum: Guaiac result positive. External hemorrhoid present. No tenderness.     Comments: The exam was performed with a chaperone present.  Small nonthrombosed external hemorrhoid noted at approximately the 7:00 region.  No anal fissures noted.  No gross melena on DRE but there was some presence of bright red blood noted. Musculoskeletal: Normal range of motion.  Skin:    General: Skin is warm and dry.     Capillary Refill: Capillary refill takes less than 2 seconds.  Neurological:     Mental Status: She is alert and oriented to person, place, and time.  Psychiatric:        Speech: Speech normal.      ED Treatments / Results  Labs (all labs ordered are listed, but only abnormal results are displayed) Labs Reviewed  COMPREHENSIVE METABOLIC PANEL - Abnormal; Notable for the following components:      Result Value   Glucose, Bld 101 (*)    Calcium 8.7 (*)    AST 13 (*)    All other components within normal limits  CBC - Abnormal; Notable for the following components:   WBC 11.4 (*)    RDW 16.3 (*)    All other components within normal limits  POC OCCULT BLOOD, ED - Abnormal; Notable for the following components:   Fecal Occult Bld POSITIVE (*)    All other components within normal limits  TYPE AND SCREEN  ABO/RH    EKG None  Radiology Ct Abdomen Pelvis W Contrast  Result Date: 02/28/2018 CLINICAL DATA:  Abdominal pain EXAM: CT ABDOMEN AND PELVIS WITH CONTRAST TECHNIQUE: Multidetector CT imaging of the abdomen and pelvis was performed using the standard protocol following bolus administration of intravenous contrast. CONTRAST:  116mL ISOVUE-300 IOPAMIDOL (ISOVUE-300)  INJECTION  61% COMPARISON:  None. FINDINGS: LOWER CHEST: There is no basilar pleural or apical pericardial effusion. HEPATOBILIARY: The hepatic contours and density are normal. There is no intra- or extrahepatic biliary dilatation. The gallbladder is normal. PANCREAS: The pancreatic parenchymal contours are normal and there is no ductal dilatation. There is no peripancreatic fluid collection. SPLEEN: Normal. ADRENALS/URINARY TRACT: --Adrenal glands: Normal. --Right kidney/ureter: No hydronephrosis, nephroureterolithiasis, perinephric stranding or solid renal mass. --Left kidney/ureter: No hydronephrosis, nephroureterolithiasis, perinephric stranding or solid renal mass. --Urinary bladder: Normal for degree of distention STOMACH/BOWEL: --Stomach/Duodenum: There is no hiatal hernia or other gastric abnormality. The duodenal course and caliber are normal. --Small bowel: No dilatation or inflammation. --Colon: No focal abnormality. --Appendix: Normal. VASCULAR/LYMPHATIC: There is aortic atherosclerosis without hemodynamically significant stenosis. The portal vein, splenic vein, superior mesenteric vein and IVC are patent. No abdominal or pelvic lymphadenopathy. REPRODUCTIVE: Status post hysterectomy. No adnexal mass. MUSCULOSKELETAL. Multilevel degenerative disc disease and facet arthrosis. No bony spinal canal stenosis. OTHER: None. IMPRESSION: No acute abdominal or pelvic abnormality. Electronically Signed   By: Ulyses Jarred M.D.   On: 02/28/2018 18:50    Procedures Procedures (including critical care time)  Medications Ordered in ED Medications  iopamidol (ISOVUE-300) 61 % injection 100 mL (100 mLs Intravenous Contrast Given 02/28/18 1827)     Initial Impression / Assessment and Plan / ED Course  I have reviewed the triage vital signs and the nursing notes.  Pertinent labs & imaging results that were available during my care of the patient were reviewed by me and considered in my medical decision making (see chart  for details).     73 year old female who presents for evaluation of rectal bleeding that is been ongoing for last 4 days.  No associated pain with bowel movements.  History of anemia for which she takes p.o. iron pills. Patient is afebrile, non-toxic appearing, sitting comfortably on examination table. Vital signs reviewed and stable.  Concern for GI bleed versus diverticulitis versus bleeding associated from hemorrhoid.  Plan check basic labs.  CBC shows slight leukocytosis of 11.4.  Hemoglobin and hematocrit stable at 12.4 and 40.2 respectively.  POC occult blood was positive.  Suspect this is likely due to hemorrhoids.  Patient does have a history of diverticulosis.  Patient exhibits no abdominal pain here but will plan for CT abdomen pelvis for evaluation of diverticulitis.  CT on pelvis shows no acute abnormalities.  No evidence of diverticulitis.   Discussed patient with Dr. Rex Kras.  Suspect that patient's bleeding is likely due to hemorrhoids.  She has had internal hemorrhoids previously.  I suspect that is what is contributing to her bleeding.  Additionally, there was noted external hemorrhoids on today's exam.  At this time, do not suspect GI bleed.  Hemoglobin is stable.  Patient does have follow-up with GI. Dr. Rex Kras agreeable to plan.  Discussed results with patient.  She and her husband are agreeable.  Instructed patient to continue taking her iron pills and to call her GI doctor for follow-up appointment. At this time, patient exhibits no emergent life-threatening condition that require further evaluation in ED or admission. Patient had ample opportunity for questions and discussion. All patient's questions were answered with full understanding. Strict return precautions discussed. Patient expresses understanding and agreement to plan.   Portions of this note were generated with Lobbyist. Dictation errors may occur despite best attempts at proofreading.   Final  Clinical Impressions(s) / ED Diagnoses   Final diagnoses:  Rectal bleeding  Hemorrhoids, unspecified hemorrhoid type    ED Discharge Orders    None       Desma Mcgregor 02/28/18 2247    Little, Wenda Overland, MD 03/01/18 [phone]

## 2018-03-21 ENCOUNTER — Encounter: Payer: Self-pay | Admitting: Internal Medicine

## 2018-03-21 ENCOUNTER — Ambulatory Visit (INDEPENDENT_AMBULATORY_CARE_PROVIDER_SITE_OTHER): Payer: Medicare Other | Admitting: Internal Medicine

## 2018-03-21 VITALS — BP 142/94 | HR 100 | Ht 58.5 in | Wt 175.5 lb

## 2018-03-21 DIAGNOSIS — K625 Hemorrhage of anus and rectum: Secondary | ICD-10-CM | POA: Diagnosis not present

## 2018-03-21 MED ORDER — NA SULFATE-K SULFATE-MG SULF 17.5-3.13-1.6 GM/177ML PO SOLN: 1.0000 | Freq: Once | ORAL | 0 refills | Status: AC

## 2018-03-21 NOTE — Progress Notes (Signed)
HISTORY OF PRESENT ILLNESS:  Mallory Klein is a 73 y.o. female with past medical history as listed below who has been followed in this office previously for chronic GERD and colon polyp surveillance.  She was last evaluated in this office January 15, 2016 regarding GI and non-GI issues.  See that dictation.  She presents today with a chief complaint of new onset rectal bleeding.  She is accompanied by her husband.  Patient reports being in her usual state of health until the beginning of January when on 3 consecutive days she had painless rectal bleeding described as blood clots associated with the stool.  No abdominal or rectal pain as stated.  She was evaluated in the emergency room and was found to have a nonthrombosed external hemorrhoid.  Red blood on the examining glove and Hemoccult positive stool.  Last colonoscopy August 2014 revealed mild diverticulosis throughout the colon but was otherwise normal.  She did have small internal hemorrhoids.  Patient denies any further bleeding since.  No change in her bowel habits.  No blood thinners.  She does have a history of chronic anemia for which she is maintained on iron.  Last CBC February 28, 2018 revealed normal hemoglobin of 12.4.  Unremarkable comprehensive metabolic panel.  CT scan February 28, 2018 revealed no acute abnormality of the abdomen or pelvis.  Follow-up with GI recommended.  REVIEW OF SYSTEMS:  All non-GI ROS negative unless otherwise stated in the HPI except for arthritis, back pain, visual change, ankle swelling  Past Medical History:  Diagnosis Date  . Anxiety   . Asthma   . Depression   . Diverticulosis of colon (without mention of hemorrhage)   . Esophageal stricture   . GERD (gastroesophageal reflux disease)   . Heart murmur   . Hypertension   . Internal hemorrhoids without mention of complication   . Osteoarthritis   . Shortness of breath dyspnea    with allergies    Past Surgical History:  Procedure Laterality  Date  . ABDOMINAL HYSTERECTOMY    . BREAST CYST EXCISION Left 1980's  . LUMBAR LAMINECTOMY/DECOMPRESSION MICRODISCECTOMY Bilateral 02/20/2014   Procedure: LUMBAR TWO THREE, LUMBAR THREE FOUR LUMBAR LAMINECTOMY/DECOMPRESSION MICRODISCECTOMY 2 LEVELS;  Surgeon: Eustace [facility], MD;  Location: [facility];  Service: Neurosurgery;  Laterality: Bilateral;  . SPLENECTOMY    . TIBIA FRACTURE SURGERY Right   . TIBIA FRACTURE SURGERY Left   . TOTAL KNEE ARTHROPLASTY Right 2013    Social History AYEISHA LINDENBERGER  reports that she has never smoked. She has never used smokeless tobacco. She reports that she does not drink alcohol or use drugs.  family history includes Asthma in her mother; Dementia in her mother; Diabetes in her mother; Diabetes type II in her mother; Hypertension in her mother; Mental illness in her father; Prostate cancer in her father; Stroke in her brother; Suicidality in her son.  Allergies  Allergen Reactions  . Ace Inhibitors     Patient doesn't recall reaction.  jkl  . Aspirin Other (See Comments)    bruising  . Citalopram Hydrobromide Diarrhea       PHYSICAL EXAMINATION: Vital signs: BP (!) 142/94 (BP Location: Left Arm, Patient Position: Sitting, Cuff Size: Normal)   Pulse 100   Ht 4' 10.5" (1.486 m) Comment: height measured without shoes  Wt 175 lb 8 oz (79.6 kg)   BMI 36.06 kg/m   Constitutional: generally well-appearing, no acute distress Psychiatric: alert and oriented x3, cooperative Eyes: extraocular  movements intact, anicteric, conjunctiva pink Mouth: oral pharynx moist, no lesions Neck: supple no lymphadenopathy Cardiovascular: heart regular rate and rhythm, no murmur Lungs: clear to auscultation bilaterally Abdomen: soft, nontender, nondistended, no obvious ascites, no peritoneal signs, normal bowel sounds, no organomegaly Rectal: Deferred until colonoscopy Extremities: no lower extremity edema bilaterally Skin: no lesions on visible  extremities Neuro: Resting tremor right upper extremity.  No additional focal deficits. No asterixis.    ASSESSMENT:  1.  Recurrent rectal bleeding earlier this month.  Etiology unclear.  Colonoscopy 5-1/2 years ago revealed diverticulosis and small internal hemorrhoids.  Rule out hemorrhoids.  Rule out interval neoplasia 2.  Multiple medical problems  PLAN:  1.  Schedule colonoscopy to evaluate rectal bleeding.The nature of the procedure, as well as the risks, benefits, and alternatives were carefully and thoroughly reviewed with the patient. Ample time for discussion and questions allowed. The patient understood, was satisfied, and agreed to proceed.

## 2018-03-21 NOTE — Patient Instructions (Signed)

## 2018-03-23 ENCOUNTER — Telehealth: Payer: Self-pay | Admitting: Internal Medicine

## 2018-03-23 NOTE — Telephone Encounter (Signed)
Pt advised that the subprep is to expensive. If there is something else she can get. JG

## 2018-03-24 ENCOUNTER — Ambulatory Visit: Payer: Medicare Other | Admitting: Internal Medicine

## 2018-03-24 ENCOUNTER — Encounter: Payer: Self-pay | Admitting: Internal Medicine

## 2018-03-24 DIAGNOSIS — G20 Parkinson's disease: Secondary | ICD-10-CM | POA: Diagnosis not present

## 2018-03-24 DIAGNOSIS — G8929 Other chronic pain: Secondary | ICD-10-CM

## 2018-03-24 DIAGNOSIS — R413 Other amnesia: Secondary | ICD-10-CM | POA: Diagnosis not present

## 2018-03-24 DIAGNOSIS — D500 Iron deficiency anemia secondary to blood loss (chronic): Secondary | ICD-10-CM | POA: Diagnosis not present

## 2018-03-24 DIAGNOSIS — M5440 Lumbago with sciatica, unspecified side: Secondary | ICD-10-CM | POA: Diagnosis not present

## 2018-03-24 MED ORDER — DONEPEZIL HCL 5 MG PO TABS: 5.0000 mg | ORAL_TABLET | Freq: Every day | ORAL | 11 refills | Status: DC

## 2018-03-24 NOTE — Telephone Encounter (Signed)
Spoke with patient she stated will come back to pick up the samples.

## 2018-03-24 NOTE — Assessment & Plan Note (Signed)
F/u w/Dr Tat Sinemet

## 2018-03-24 NOTE — Assessment & Plan Note (Signed)
Labs

## 2018-03-24 NOTE — Assessment & Plan Note (Signed)
Better after injections

## 2018-03-24 NOTE — Progress Notes (Signed)
Subjective:  Patient ID: Mallory Klein, female    DOB: [birth year]  Age: 73 y.o. MRN: [id number]  CC: No chief complaint on file.   HPI Mallory Klein presents for LBP - better w/pidural injection at [facility] issues - worse F/u asthma  Outpatient Medications Prior to Visit  Medication Sig Dispense Refill  . albuterol (PROVENTIL HFA;VENTOLIN HFA) 108 (90 Base) MCG/ACT inhaler Inhale 2 puffs into the lungs every 6 (six) hours as needed for wheezing or shortness of breath. 1 Inhaler 6  . carbidopa-levodopa (SINEMET IR) 25-100 MG tablet Take 1 tablet by mouth 3 (three) times daily. 90 tablet 3  . cetirizine (ZYRTEC) 10 MG tablet Take 10 mg by mouth daily.      . Cholecalciferol (VITAMIN D3) 1000 UNITS tablet Take 2 tablets by mouth once daily     . gabapentin (NEURONTIN) 300 MG capsule TAKE 1 CAPSULE BY MOUTH THREE TIMES A DAY (Patient taking differently: Take 300 mg by mouth 3 (three) times daily as needed. ) 270 capsule 3  . IRON PO Take 1 tablet by mouth daily.    . montelukast (SINGULAIR) 10 MG tablet TAKE 1 TABLET BY MOUTH EVERY DAY 90 tablet 1  . primidone (MYSOLINE) 50 MG tablet TAKE 1 TABLET BY MOUTH THREE TIMES A DAY 270 tablet 1  . venlafaxine XR (EFFEXOR-XR) 150 MG 24 hr capsule TAKE 1 CAPSULE BY MOUTH EVERY DAY WITH BREAKFAST (Patient taking differently: Take 150 mg by mouth daily with breakfast. ) 90 capsule 3  . verapamil (VERELAN) 120 MG 24 hr capsule Take 1 capsule (120 mg total) by mouth at bedtime. 30 capsule 11   No facility-administered medications prior to visit.     ROS: Review of Systems  Constitutional: Negative for activity change, appetite change, chills, fatigue and unexpected weight change.  HENT: Negative for congestion, mouth sores and sinus pressure.   Eyes: Negative for visual disturbance.  Respiratory: Negative for cough and chest tightness.   Gastrointestinal: Negative for abdominal pain and nausea.  Genitourinary: Negative for  difficulty urinating, frequency and vaginal pain.  Musculoskeletal: Positive for back pain and gait problem.  Skin: Negative for pallor and rash.  Neurological: Negative for dizziness, tremors, weakness, numbness and headaches.  Psychiatric/Behavioral: Negative for confusion, sleep disturbance and suicidal ideas.    Objective:  BP 136/84 (BP Location: Left Arm, Patient Position: Sitting, Cuff Size: Large)   Pulse 88   Temp 97.7 F (36.5 C) (Oral)   Ht 4' 10.5" (1.486 m)   Wt 175 lb (79.4 kg)   SpO2 94%   BMI 35.95 kg/m   BP Readings from Last 3 Encounters:  03/24/18 136/84  03/21/18 (!) 142/94  02/28/18 119/66    Wt Readings from Last 3 Encounters:  03/24/18 175 lb (79.4 kg)  03/21/18 175 lb 8 oz (79.6 kg)  02/28/18 160 lb (72.6 kg)    Physical Exam  Lab Results  Component Value Date   WBC 11.4 (H) 02/28/2018   HGB 12.4 02/28/2018   HCT 40.2 02/28/2018   PLT 317 02/28/2018   GLUCOSE 101 (H) 02/28/2018   CHOL 219 (H) 02/05/2015   TRIG 69.0 02/05/2015   HDL 73.70 02/05/2015   LDLDIRECT 109.8 02/10/2010   LDLCALC 132 (H) 02/05/2015   ALT 13 02/28/2018   AST 13 (L) 02/28/2018   NA 142 02/28/2018   K 4.2 02/28/2018   CL 104 02/28/2018   CREATININE 0.88 02/28/2018   BUN 21 02/28/2018   CO2  31 02/28/2018   TSH 0.82 02/12/2017   INR 0.97 02/07/2014   HGBA1C 6.2 02/12/2017    Ct Abdomen Pelvis W Contrast  Result Date: 02/28/2018 CLINICAL DATA:  Abdominal pain EXAM: CT ABDOMEN AND PELVIS WITH CONTRAST TECHNIQUE: Multidetector CT imaging of the abdomen and pelvis was performed using the standard protocol following bolus administration of intravenous contrast. CONTRAST:  137mL ISOVUE-300 IOPAMIDOL (ISOVUE-300) INJECTION 61% COMPARISON:  None. FINDINGS: LOWER CHEST: There is no basilar pleural or apical pericardial effusion. HEPATOBILIARY: The hepatic contours and density are normal. There is no intra- or extrahepatic biliary dilatation. The gallbladder is normal.  PANCREAS: The pancreatic parenchymal contours are normal and there is no ductal dilatation. There is no peripancreatic fluid collection. SPLEEN: Normal. ADRENALS/URINARY TRACT: --Adrenal glands: Normal. --Right kidney/ureter: No hydronephrosis, nephroureterolithiasis, perinephric stranding or solid renal mass. --Left kidney/ureter: No hydronephrosis, nephroureterolithiasis, perinephric stranding or solid renal mass. --Urinary bladder: Normal for degree of distention STOMACH/BOWEL: --Stomach/Duodenum: There is no hiatal hernia or other gastric abnormality. The duodenal course and caliber are normal. --Small bowel: No dilatation or inflammation. --Colon: No focal abnormality. --Appendix: Normal. VASCULAR/LYMPHATIC: There is aortic atherosclerosis without hemodynamically significant stenosis. The portal vein, splenic vein, superior mesenteric vein and IVC are patent. No abdominal or pelvic lymphadenopathy. REPRODUCTIVE: Status post hysterectomy. No adnexal mass. MUSCULOSKELETAL. Multilevel degenerative disc disease and facet arthrosis. No bony spinal canal stenosis. OTHER: None. IMPRESSION: No acute abdominal or pelvic abnormality. Electronically Signed   By: Ulyses Jarred M.D.   On: 02/28/2018 18:50    Assessment & Plan:   There are no diagnoses linked to this encounter.   No orders of the defined types were placed in this encounter.    Follow-up: No follow-ups on file.  Walker Kehr, MD

## 2018-03-24 NOTE — Telephone Encounter (Signed)
Pt is calling back about her suprep being too expensive.  Asking for a call back [phone]

## 2018-03-24 NOTE — Telephone Encounter (Signed)
Lm on vm that I would leave a Suprep sample up front to be picked up

## 2018-03-24 NOTE — Assessment & Plan Note (Addendum)
Mild cognitive deficit Parents had Alzheimer's memory issues - worse - we can start Aricept

## 2018-03-28 ENCOUNTER — Inpatient Hospital Stay: Payer: Medicare Other | Attending: Hematology

## 2018-03-28 DIAGNOSIS — J45909 Unspecified asthma, uncomplicated: Secondary | ICD-10-CM | POA: Insufficient documentation

## 2018-03-28 DIAGNOSIS — I10 Essential (primary) hypertension: Secondary | ICD-10-CM | POA: Diagnosis not present

## 2018-03-28 DIAGNOSIS — G20 Parkinson's disease: Secondary | ICD-10-CM | POA: Insufficient documentation

## 2018-03-28 DIAGNOSIS — R413 Other amnesia: Secondary | ICD-10-CM | POA: Insufficient documentation

## 2018-03-28 DIAGNOSIS — D649 Anemia, unspecified: Secondary | ICD-10-CM

## 2018-03-28 DIAGNOSIS — D509 Iron deficiency anemia, unspecified: Secondary | ICD-10-CM | POA: Diagnosis not present

## 2018-03-28 DIAGNOSIS — F419 Anxiety disorder, unspecified: Secondary | ICD-10-CM | POA: Insufficient documentation

## 2018-03-28 DIAGNOSIS — M549 Dorsalgia, unspecified: Secondary | ICD-10-CM | POA: Insufficient documentation

## 2018-03-28 DIAGNOSIS — D508 Other iron deficiency anemias: Secondary | ICD-10-CM

## 2018-03-28 DIAGNOSIS — Z79899 Other long term (current) drug therapy: Secondary | ICD-10-CM | POA: Diagnosis not present

## 2018-03-28 DIAGNOSIS — R7989 Other specified abnormal findings of blood chemistry: Secondary | ICD-10-CM | POA: Diagnosis not present

## 2018-03-28 LAB — IRON AND TIBC
Iron: 91 ug/dL (ref 41–142)
SATURATION RATIOS: 33 % (ref 21–57)
TIBC: 280 ug/dL (ref 236–444)
UIBC: 189 ug/dL (ref 120–384)

## 2018-03-28 LAB — CBC WITH DIFFERENTIAL (CANCER CENTER ONLY)
Abs Immature Granulocytes: 0.04 10*3/uL (ref 0.00–0.07)
Basophils Absolute: 0.1 10*3/uL (ref 0.0–0.1)
Basophils Relative: 1 %
Eosinophils Absolute: 0.1 10*3/uL (ref 0.0–0.5)
Eosinophils Relative: 1 %
HCT: 42.2 % (ref 36.0–46.0)
Hemoglobin: 13.4 g/dL (ref 12.0–15.0)
Immature Granulocytes: 0 %
Lymphocytes Relative: 30 %
Lymphs Abs: 3.2 10*3/uL (ref 0.7–4.0)
MCH: 30.3 pg (ref 26.0–34.0)
MCHC: 31.8 g/dL (ref 30.0–36.0)
MCV: 95.5 fL (ref 80.0–100.0)
MONO ABS: 0.7 10*3/uL (ref 0.1–1.0)
Monocytes Relative: 7 %
Neutro Abs: 6.3 10*3/uL (ref 1.7–7.7)
Neutrophils Relative %: 61 %
Platelet Count: 358 10*3/uL (ref 150–400)
RBC: 4.42 MIL/uL (ref 3.87–5.11)
RDW: 16.8 % — AB (ref 11.5–15.5)
WBC Count: 10.4 10*3/uL (ref 4.0–10.5)
nRBC: 0 % (ref 0.0–0.2)

## 2018-03-28 LAB — RETICULOCYTES
Immature Retic Fract: 17.7 % — ABNORMAL HIGH (ref 2.3–15.9)
RBC.: 4.42 MIL/uL (ref 3.87–5.11)
Retic Count, Absolute: 85.3 10*3/uL (ref 19.0–186.0)
Retic Ct Pct: 1.9 % (ref 0.4–3.1)

## 2018-03-28 LAB — FERRITIN: Ferritin: 44 ng/mL (ref 11–307)

## 2018-03-30 ENCOUNTER — Ambulatory Visit: Payer: Medicare Other | Admitting: Hematology

## 2018-03-31 ENCOUNTER — Encounter: Payer: Self-pay | Admitting: Internal Medicine

## 2018-03-31 ENCOUNTER — Ambulatory Visit (AMBULATORY_SURGERY_CENTER): Payer: Medicare Other | Admitting: Internal Medicine

## 2018-03-31 VITALS — BP 186/91 | HR 77 | Temp 95.0°F | Resp 14 | Ht 58.5 in | Wt 175.0 lb

## 2018-03-31 DIAGNOSIS — D124 Benign neoplasm of descending colon: Secondary | ICD-10-CM

## 2018-03-31 DIAGNOSIS — K625 Hemorrhage of anus and rectum: Secondary | ICD-10-CM

## 2018-03-31 DIAGNOSIS — D127 Benign neoplasm of rectosigmoid junction: Secondary | ICD-10-CM

## 2018-03-31 DIAGNOSIS — K6389 Other specified diseases of intestine: Secondary | ICD-10-CM | POA: Diagnosis not present

## 2018-03-31 DIAGNOSIS — K5730 Diverticulosis of large intestine without perforation or abscess without bleeding: Secondary | ICD-10-CM

## 2018-03-31 DIAGNOSIS — K649 Unspecified hemorrhoids: Secondary | ICD-10-CM | POA: Diagnosis not present

## 2018-03-31 DIAGNOSIS — D122 Benign neoplasm of ascending colon: Secondary | ICD-10-CM | POA: Diagnosis not present

## 2018-03-31 MED ORDER — SODIUM CHLORIDE 0.9 % IV SOLN: 500.0000 mL | Freq: Once | INTRAVENOUS | Status: DC

## 2018-03-31 NOTE — Progress Notes (Signed)
PT taken to PACU. Monitors in place. VSS. Report given to RN. 

## 2018-03-31 NOTE — Patient Instructions (Signed)
Continue present medications. Please read handouts provided. Await pathology results.       YOU HAD AN ENDOSCOPIC PROCEDURE TODAY AT [facility]:   Refer to the procedure report that was given to you for any specific questions about what was found during the examination.  If the procedure report does not answer your questions, please call your gastroenterologist to clarify.  If you requested that your care partner not be given the details of your procedure findings, then the procedure report has been included in a sealed envelope for you to review at your convenience later.  YOU SHOULD EXPECT: Some feelings of bloating in the abdomen. Passage of more gas than usual.  Walking can help get rid of the air that was put into your GI tract during the procedure and reduce the bloating. If you had a lower endoscopy (such as a colonoscopy or flexible sigmoidoscopy) you may notice spotting of blood in your stool or on the toilet paper. If you underwent a bowel prep for your procedure, you may not have a normal bowel movement for a few days.  Please Note:  You might notice some irritation and congestion in your nose or some drainage.  This is from the oxygen used during your procedure.  There is no need for concern and it should clear up in a day or so.  SYMPTOMS TO REPORT IMMEDIATELY:   Following lower endoscopy (colonoscopy or flexible sigmoidoscopy):  Excessive amounts of blood in the stool  Significant tenderness or worsening of abdominal pains  Swelling of the abdomen that is new, acute  Fever of 100F or higher    For urgent or emergent issues, a gastroenterologist can be reached at any hour by calling [phone].   DIET:  We do recommend a small meal at first, but then you may proceed to your regular diet.  Drink plenty of fluids but you should avoid alcoholic beverages for 24 hours.  ACTIVITY:  You should plan to take it easy for the rest of today and you should NOT  DRIVE or use heavy machinery until tomorrow (because of the sedation medicines used during the test).    FOLLOW UP: Our staff will call the number listed on your records the next business day following your procedure to check on you and address any questions or concerns that you may have regarding the information given to you following your procedure. If we do not reach you, we will leave a message.  However, if you are feeling well and you are not experiencing any problems, there is no need to return our call.  We will assume that you have returned to your regular daily activities without incident.  If any biopsies were taken you will be contacted by phone or by letter within the next 1-3 weeks.  Please call us at [phone] if you have not heard about the biopsies in 3 weeks.    SIGNATURES/CONFIDENTIALITY: You and/or your care partner have signed paperwork which will be entered into your electronic medical record.  These signatures attest to the fact that that the information above on your After Visit Summary has been reviewed and is understood.  Full responsibility of the confidentiality of this discharge information lies with you and/or your care-partner.

## 2018-03-31 NOTE — Progress Notes (Signed)
Called to room to assist during endoscopic procedure.  Patient ID and intended procedure confirmed with present staff. Received instructions for my participation in the procedure from the performing physician.  

## 2018-03-31 NOTE — Op Note (Signed)
[facility] Patient Name: Mallory Klein Procedure Date: 03/31/2018 9:15 AM MRN: [id number] Endoscopist: Docia Chuck. Henrene Pastor , MD Age: 73 Referring MD:  Date of Birth: [birth year] Gender: Female Account #: [id number] Procedure:                Colonoscopy cold snare polypectomy x 4 Indications:              Rectal bleeding. Previous examinations 2006 (fair                            preparation) in 2014 (diverticulosis) Medicines:                Monitored Anesthesia Care Procedure:                Pre-Anesthesia Assessment:                           - Prior to the procedure, a History and Physical                            was performed, and patient medications and                            allergies were reviewed. The patient's tolerance of                            previous anesthesia was also reviewed. The risks                            and benefits of the procedure and the sedation                            options and risks were discussed with the patient.                            All questions were answered, and informed consent                            was obtained. Prior Anticoagulants: The patient has                            taken no previous anticoagulant or antiplatelet                            agents. ASA Grade Assessment: II - A patient with                            mild systemic disease. After reviewing the risks                            and benefits, the patient was deemed in                            satisfactory condition to undergo the procedure.  After obtaining informed consent, the colonoscope                            was passed under direct vision. Throughout the                            procedure, the patient's blood pressure, pulse, and                            oxygen saturations were monitored continuously. The                            Colonoscope was introduced through the anus and      advanced to the the cecum, identified by                            appendiceal orifice and ileocecal valve. The                            ileocecal valve, appendiceal orifice, and rectum                            were photographed. The quality of the bowel                            preparation was good. The colonoscopy was performed                            without difficulty. The patient tolerated the                            procedure well. The bowel preparation used was                            SUPREP. Scope In: 9:26:02 AM Scope Out: 9:49:35 AM Scope Withdrawal Time: 0 hours 17 minutes 58 seconds  Total Procedure Duration: 0 hours 23 minutes 33 seconds  Findings:                 Four polyps were found in the recto-sigmoid colon,                            descending colon and ascending colon. The polyps                            were 2 to 8 mm in size. These polyps were removed                            with a cold snare. Resection and retrieval were                            complete.                           Diverticula were found in  the left colon.                           Internal hemorrhoids were found during                            retroflexion. The hemorrhoids were moderate.                           The exam was otherwise without abnormality on                            direct and retroflexion views. Complications:            No immediate complications. Estimated blood loss:                            None. Estimated Blood Loss:     Estimated blood loss: none. Impression:               - Four 2 to 8 mm polyps at the recto-sigmoid colon,                            in the descending colon and in the ascending colon,                            removed with a cold snare. Resected and retrieved.                           - Diverticulosis in the left colon.                           - Internal hemorrhoids.                           - The examination was  otherwise normal on direct                            and retroflexion views. Recommendation:           - Repeat colonoscopy in 3 or 5 years for                            surveillance pending final pathology.                           - Patient has a contact number available for                            emergencies. The signs and symptoms of potential                            delayed complications were discussed with the                            patient. Return to normal activities tomorrow.  Written discharge instructions were provided to the                            patient.                           - Resume previous diet.                           - Continue present medications.                           - Await pathology results. Docia Chuck. Henrene Pastor, MD 03/31/2018 9:55:30 AM This report has been signed electronically.

## 2018-04-01 ENCOUNTER — Telehealth: Payer: Self-pay

## 2018-04-01 NOTE — Telephone Encounter (Signed)
  Follow up Call-  Call back number 03/31/2018  Post procedure Call Back phone  # [phone]  Permission to leave phone message Yes  Some recent data might be hidden     Patient questions:  Do you have a fever, pain , or abdominal swelling? No. Pain Score  0 *  Have you tolerated food without any problems? Yes.    Have you been able to return to your normal activities? Yes.    Do you have any questions about your discharge instructions: Diet   No. Medications  No. Follow up visit  No.  Do you have questions or concerns about your Care? No.  Actions: * If pain score is 4 or above: No action needed, pain <4.

## 2018-04-05 ENCOUNTER — Encounter: Payer: Self-pay | Admitting: Internal Medicine

## 2018-04-05 NOTE — Progress Notes (Signed)
[facility]   Telephone:([phone]) [phone] Fax:(336) [phone]   Clinic Follow up Note   Patient Care Team: Plotnikov, Evie Lacks, MD as PCP - General Magda Bernheim, MD as Referring Physician (Orthopedic Surgery) Irene Shipper, MD (Gastroenterology) Eustace Moore, MD as Consulting Physician (Neurosurgery) Clydell Hakim, MD as Consulting Physician (Pain Medicine) Truitt Merle, MD as Consulting Physician (Hematology) Alla Feeling, NP as Nurse Practitioner (Nurse Practitioner) 04/07/2018  CHIEF COMPLAINT: Iron deficiency anemia, thrombocytosis   CURRENT THERAPY Oral iron supplement 1 tab daily started on 03/19/17, increased to 1 tab BID on 04/02/17; s/p IV Feraheme 05/03/17, 05/11/17  INTERVAL HISTORY: Mallory Klein is a 73 y.o. female who is here for follow-up. She went to the ED on 02/28/2018 for episodes of rectal bleeding and they were likely due to hemorrhoids. She went to see a gastroenterologist and had a colonoscopy. Today, she is here with a family member and she is doing well. She has noticed memory loss. She has her Parkinson is under control but her right foot has more tremor lately.    Pertinent positives and negatives of review of systems are listed and detailed within the above HPI.  REVIEW OF SYSTEMS:  Constitutional: Denies fevers, chills or abnormal weight loss, (+) memory loss, (+) tremor in hands and right feet  Eyes: Denies blurriness of vision Ears, nose, mouth, throat, and face: Denies mucositis or sore throat Respiratory: Denies cough, dyspnea or wheezes Cardiovascular: Denies palpitation, chest discomfort or lower extremity swelling Gastrointestinal:  Denies nausea, heartburn or change in bowel habits Skin: Denies abnormal skin rashes Lymphatics: Denies new lymphadenopathy or easy bruising Neurological:Denies numbness, tingling or new weaknesses Behavioral/Psych: Mood is stable, no new changes  All other systems were reviewed with the patient and  are negative.  MEDICAL HISTORY:  Past Medical History:  Diagnosis Date  . Anxiety   . Asthma   . Depression   . Diverticulosis of colon (without mention of hemorrhage)   . Esophageal stricture   . GERD (gastroesophageal reflux disease)   . Heart murmur   . Hypertension   . Internal hemorrhoids without mention of complication   . Osteoarthritis   . Shortness of breath dyspnea    with allergies    SURGICAL HISTORY: Past Surgical History:  Procedure Laterality Date  . ABDOMINAL HYSTERECTOMY    . BREAST CYST EXCISION Left 1980's  . LUMBAR LAMINECTOMY/DECOMPRESSION MICRODISCECTOMY Bilateral 02/20/2014   Procedure: LUMBAR TWO THREE, LUMBAR THREE FOUR LUMBAR LAMINECTOMY/DECOMPRESSION MICRODISCECTOMY 2 LEVELS;  Surgeon: Eustace Moore, MD;  Location: [facility];  Service: Neurosurgery;  Laterality: Bilateral;  . SPLENECTOMY    . TIBIA FRACTURE SURGERY Right   . TIBIA FRACTURE SURGERY Left   . TOTAL KNEE ARTHROPLASTY Right 2013    I have reviewed the social history and family history with the patient and they are unchanged from previous note.  ALLERGIES:  is allergic to ace inhibitors; aspirin; and citalopram hydrobromide.  MEDICATIONS:  Current Outpatient Medications  Medication Sig Dispense Refill  . albuterol (PROVENTIL HFA;VENTOLIN HFA) 108 (90 Base) MCG/ACT inhaler Inhale 2 puffs into the lungs every 6 (six) hours as needed for wheezing or shortness of breath. 1 Inhaler 6  . carbidopa-levodopa (SINEMET IR) 25-100 MG tablet Take 1 tablet by mouth 3 (three) times daily. 90 tablet 3  . cetirizine (ZYRTEC) 10 MG tablet Take 10 mg by mouth daily.      . Cholecalciferol (VITAMIN D3) 1000 UNITS tablet Take 2 tablets by mouth  once daily     . donepezil (ARICEPT) 5 MG tablet Take 1 tablet (5 mg total) by mouth at bedtime. 30 tablet 11  . gabapentin (NEURONTIN) 300 MG capsule TAKE 1 CAPSULE BY MOUTH THREE TIMES A DAY (Patient taking differently: Take 300 mg by mouth 3 (three) times  daily as needed. ) 270 capsule 3  . IRON PO Take 1 tablet by mouth daily.    . montelukast (SINGULAIR) 10 MG tablet TAKE 1 TABLET BY MOUTH EVERY DAY 90 tablet 1  . primidone (MYSOLINE) 50 MG tablet TAKE 1 TABLET BY MOUTH THREE TIMES A DAY 270 tablet 1  . venlafaxine XR (EFFEXOR-XR) 150 MG 24 hr capsule TAKE 1 CAPSULE BY MOUTH EVERY DAY WITH BREAKFAST (Patient taking differently: Take 150 mg by mouth daily with breakfast. ) 90 capsule 3  . verapamil (VERELAN) 120 MG 24 hr capsule Take 1 capsule (120 mg total) by mouth at bedtime. 30 capsule 11   No current facility-administered medications for this visit.     PHYSICAL EXAMINATION: ECOG PERFORMANCE STATUS: 1 - Symptomatic but completely ambulatory  Vitals:   04/07/18 1444  BP: (!) 155/85  Pulse: 88  Resp: 17  Temp: 98.7 F (37.1 C)  SpO2: 97%   Filed Weights   04/07/18 1444  Weight: 176 lb 6.4 oz (80 kg)    GENERAL:alert, no distress and comfortable, (+) slight tremor in hands and right foot  SKIN: skin color, texture, turgor are normal, no rashes or significant lesions EYES: normal, Conjunctiva are pink and non-injected, sclera clear OROPHARYNX:no exudate, no erythema and lips, buccal mucosa, and tongue normal  NECK: supple, thyroid normal size, non-tender, without nodularity LYMPH:  no palpable lymphadenopathy in the cervical, axillary or inguinal LUNGS: clear to auscultation and percussion with normal breathing effort HEART: regular rate & rhythm and no murmurs and no lower extremity edema ABDOMEN:abdomen soft, non-tender and normal bowel sounds Musculoskeletal:no cyanosis of digits and no clubbing  NEURO: alert & oriented x 3 with fluent speech, no focal motor/sensory deficits  LABORATORY DATA:  I have reviewed the data as listed CBC Latest Ref Rng & Units 03/28/2018 02/28/2018 01/24/2018  WBC 4.0 - 10.5 K/uL 10.4 11.4(H) 11.7(H)  Hemoglobin 12.0 - 15.0 g/dL 13.4 12.4 13.8  Hematocrit 36.0 - 46.0 % 42.2 40.2 42.9  Platelets  150 - 400 K/uL 358 317 379     CMP Latest Ref Rng & Units 02/28/2018 02/12/2017 10/09/2016  Glucose 70 - 99 mg/dL 101(H) 89 113(H)  BUN 8 - 23 mg/dL '21 12 11  ' Creatinine 0.44 - 1.00 mg/dL 0.88 0.82 0.90  Sodium 135 - 145 mmol/L 142 140 142  Potassium 3.5 - 5.1 mmol/L 4.2 4.5 4.0  Chloride 98 - 111 mmol/L 104 101 104  CO2 22 - 32 mmol/L 31 35(H) 31  Calcium 8.9 - 10.3 mg/dL 8.7(L) 9.0 9.0  Total Protein 6.5 - 8.1 g/dL 6.8 - 7.3  Total Bilirubin 0.3 - 1.2 mg/dL 0.6 - 0.3  Alkaline Phos 38 - 126 U/L 49 - 72  AST 15 - 41 U/L 13(L) - 11  ALT 0 - 44 U/L 13 - 6   PROCEDURE:  03/31/2018 Colonoscopy  IMPRESSION - Four 2 to 8 mm polyps at the recto-sigmoid colon, in the descending colon and in the ascending colon, removed with a cold snare. Resected and retrieved. - Diverticulosis in the left colon. - Internal hemorrhoids. - The examination was otherwise normal on direct and retroflexion views.  PATHOLOGY: 03/31/2018 Surgical pathology (  [facility])  Diagnosis 1. Surgical [P], descending colon; polyp - TUBULAR ADENOMA (ONE). - NO HIGH GRADE DYSPLASIA OR MALIGNANCY. 2. Surgical [P], ascending colon, polyp - TUBULAR ADENOMA (EIGHT FRAGMENTS). - NO HIGH GRADE DYSPLASIA OR MALIGNANCY. 3. Surgical [P], ascending colon, polyp - TUBULAR ADENOMA (ONE). - NO HIGH GRADE DYSPLASIA OR MALIGNANCY. 4. Surgical [P], recto/sigmoid, polyp - POLYPOID COLONIC MUCOSA WITH REACTIVE LYMPHOID AGGREGATES. - NO ADENOMATOUS CHANGE OR CARCINOMA.  RADIOGRAPHIC STUDIES: I have personally reviewed the radiological images as listed and agreed with the findings in the report. No results found.   ASSESSMENT & PLAN:  Mallory Klein is a 73 y.o. female with history of  1. Anemia, iron deficiency -She presented for chronic anemia for at least 2 years, and recent moderate thrombocytosis.  -Pt labs from 03/19/17 was consistent with iron deficient anemia. I advised her begin oral iron supplement daily. After 4  weeks, Hgb increased from 9.2 to 10.3 on 04/02/17. She increased oral iron to BID -She subsequently saved IV iron Feraheme, responded very well, anemia resolved. -Labs reviewed, hemoglobin normal 13.4, iron studies still pending -Continue lab monitoring every 4 months, follow-up in a year.  2. Thrombocytosis, intermittent leukocytosis  -She is S/p splenectomy. Platelets were normal for period of time from 2011 to 2013 but have been consistently elevated since then in 500K range.  -Her smear from 3/19 has moderate target, few teardrop, ovalocytes, and shistocytes, She had a bone marrow biopsy surgical pathology 05/19/17 showed slightly hypocellular marrow, no morphological evidence of MDS.  I discussed with Dr. Gari Crown, the morphology of bone marrow was not impressive for MPN, also early stage MPN can not be ruled out given the marked hypercellular marrow.  -BM cytogenetics test is normal and MPN genetic panel including JAK2 was negative  -Her thrombocytosis has resolved, leukocytosis also much improved, likely related to iron deficiency.   3. HTN, asthma -Continue f/u with PCP Dr. Alain Marion  4. Cancer Screening -her last colonoscopy was 5 years ago with a normal results. -No family history of colon cancer - She went to see a gastroenterologist, Dr. Henrene Pastor, and underwent a screening colonoscopy in February 2020  5. Parkinsonism - She saw a neurologist, [name]  - She is on carbidopa/levodopa and was referred to a Parkinson's program by her neurologist   6. Back Pain - She is seeing [facility] and is considering surgery - She is on injections BDS  Gabapentin  PLAN -Lab reviewed, no anemia, iron studies still pending, will call her with results. -lab every 4 months  -F/u in one year    No problem-specific Assessment & Plan notes found for this encounter.   No orders of the defined types were placed in this encounter.  All questions were answered. The patient knows to call the  clinic with any problems, questions or concerns. No barriers to learning was detected. I spent 15 minutes counseling the patient face to face. The total time spent in the appointment was 20 minutes and more than 50% was on counseling and review of test results  I, Manson Allan am acting as scribe for Dr. Truitt Merle.  I have reviewed the above documentation for accuracy and completeness, and I agree with the above.     Truitt Merle, MD 04/07/2018

## 2018-04-07 ENCOUNTER — Inpatient Hospital Stay (HOSPITAL_BASED_OUTPATIENT_CLINIC_OR_DEPARTMENT_OTHER): Payer: Medicare Other | Admitting: Hematology

## 2018-04-07 VITALS — BP 155/85 | HR 88 | Temp 98.7°F | Resp 17 | Ht <= 58 in | Wt 176.4 lb

## 2018-04-07 DIAGNOSIS — F419 Anxiety disorder, unspecified: Secondary | ICD-10-CM

## 2018-04-07 DIAGNOSIS — D500 Iron deficiency anemia secondary to blood loss (chronic): Secondary | ICD-10-CM

## 2018-04-07 DIAGNOSIS — R7989 Other specified abnormal findings of blood chemistry: Secondary | ICD-10-CM | POA: Diagnosis not present

## 2018-04-07 DIAGNOSIS — M549 Dorsalgia, unspecified: Secondary | ICD-10-CM | POA: Diagnosis not present

## 2018-04-07 DIAGNOSIS — Z79899 Other long term (current) drug therapy: Secondary | ICD-10-CM

## 2018-04-07 DIAGNOSIS — R413 Other amnesia: Secondary | ICD-10-CM

## 2018-04-07 DIAGNOSIS — J45909 Unspecified asthma, uncomplicated: Secondary | ICD-10-CM | POA: Diagnosis not present

## 2018-04-07 DIAGNOSIS — D509 Iron deficiency anemia, unspecified: Secondary | ICD-10-CM

## 2018-04-07 DIAGNOSIS — G20 Parkinson's disease: Secondary | ICD-10-CM

## 2018-04-07 DIAGNOSIS — I10 Essential (primary) hypertension: Secondary | ICD-10-CM | POA: Diagnosis not present

## 2018-04-09 ENCOUNTER — Encounter: Payer: Self-pay | Admitting: Hematology

## 2018-04-19 ENCOUNTER — Telehealth: Payer: Self-pay | Admitting: Neurology

## 2018-04-19 NOTE — Telephone Encounter (Signed)
Spoke with patient. She states some days she feels like Carbidopa Levodopa helps and some days it doesn't. After speaking to her, it sounds like she does not keep this on a consistent schedule. She states sometimes she will wait until the afternoon to take her first dose, while sometimes she will take it in the morning. On average she is taking it sometimes in the morning, then around 1-2 pm, and then at bedtime. I advised that it only works for about 4 hours. I advised her to keep it on a schedule of 30 minutes prior to breakfast, lunch and dinner. I advised her to stay on the medication until she sees [name] on 05/13/2018 and then they can decide if she should continue after that time. I did also let her know that the medication helps with symptoms other than tremor. She agrees with this plan. [name] Juluis Rainier.

## 2018-04-19 NOTE — Telephone Encounter (Signed)
Patient is calling in about the primidone prescription and she has taken all the medication and she said that she is still shaking and wanted to see if she should continue or switch medication. Please call her back at [phone]. Thanks!

## 2018-05-12 NOTE — Progress Notes (Signed)
Mallory Klein was seen today in the movement disorders clinic for neurologic consultation at the request of Plotnikov, Evie Lacks, MD.  The consultation is for the evaluation of tremor.  Records are reviewed.  The patient was seen on October 21, 2017 at orthopedics at [facility].  She was seen for back pain, but during the visit she complained about unsteady gait and tremor.  They were concerned about a possible progressive neurologic condition that would affect the outcome of possible surgery for the back.  She was therefore referred here. This patient is accompanied in the office by her spouse who supplements the history.  05/13/18 update: Patient seen today in follow-up for parkinsonism.  This patient is accompanied in the office by her spouse who supplements the history.  She was started on carbidopa/levodopa 25/100 last visit and worked to 1 tablet 3 times per day.  She has only taken 1 tablet today (seen at 3:20pm).  States that she does not wake up out of bed until about 2 PM.  No falls since last visit except she fell out of the bed while sleeping and that "one really hurt."  Husband states that she sleeps right on the edge of the bed.    No lightheadedness or near syncope.  No hallucinations.  She was seen in the emergency room on January 6 with rectal bleeding.  She was released with GI follow-up.  She was seen by primary care on January 30 and complained about memory change.  She was started on Aricept.  Records indicate that she has been on primidone, 50 mg 3 times per day for about a year by her primary care doctor.  She does not think that she is on it, but she is not sure.  PREVIOUS MEDICATIONS: none to date  ALLERGIES:   Allergies  Allergen Reactions  . Ace Inhibitors     Patient doesn't recall reaction.  jkl  . Aspirin Other (See Comments)    bruising  . Citalopram Hydrobromide Diarrhea    CURRENT MEDICATIONS:  Outpatient Encounter Medications as of 05/13/2018  Medication Sig  .  albuterol (PROVENTIL HFA;VENTOLIN HFA) 108 (90 Base) MCG/ACT inhaler Inhale 2 puffs into the lungs every 6 (six) hours as needed for wheezing or shortness of breath.  . carbidopa-levodopa (SINEMET IR) 25-100 MG tablet Take 1 tablet by mouth 3 (three) times daily.  . cetirizine (ZYRTEC) 10 MG tablet Take 10 mg by mouth daily.    . Cholecalciferol (VITAMIN D3) 1000 UNITS tablet Take 2 tablets by mouth once daily   . donepezil (ARICEPT) 5 MG tablet Take 1 tablet (5 mg total) by mouth at bedtime.  . gabapentin (NEURONTIN) 300 MG capsule TAKE 1 CAPSULE BY MOUTH THREE TIMES A DAY (Patient taking differently: Take 300 mg by mouth 3 (three) times daily as needed. )  . IRON PO Take 1 tablet by mouth daily.  . montelukast (SINGULAIR) 10 MG tablet TAKE 1 TABLET BY MOUTH EVERY DAY  . primidone (MYSOLINE) 50 MG tablet TAKE 1 TABLET BY MOUTH THREE TIMES A DAY  . venlafaxine XR (EFFEXOR-XR) 150 MG 24 hr capsule TAKE 1 CAPSULE BY MOUTH EVERY DAY WITH BREAKFAST (Patient taking differently: Take 150 mg by mouth daily with breakfast. )  . verapamil (VERELAN) 120 MG 24 hr capsule Take 1 capsule (120 mg total) by mouth at bedtime.   No facility-administered encounter medications on file as of 05/13/2018.     PAST MEDICAL HISTORY:   Past Medical History:  Diagnosis Date  . Anxiety   . Asthma   . Depression   . Diverticulosis of colon (without mention of hemorrhage)   . Esophageal stricture   . GERD (gastroesophageal reflux disease)   . Heart murmur   . Hypertension   . Internal hemorrhoids without mention of complication   . Osteoarthritis   . Shortness of breath dyspnea    with allergies    PAST SURGICAL HISTORY:   Past Surgical History:  Procedure Laterality Date  . ABDOMINAL HYSTERECTOMY    . BREAST CYST EXCISION Left 1980's  . LUMBAR LAMINECTOMY/DECOMPRESSION MICRODISCECTOMY Bilateral 02/20/2014   Procedure: LUMBAR TWO THREE, LUMBAR THREE FOUR LUMBAR LAMINECTOMY/DECOMPRESSION MICRODISCECTOMY 2  LEVELS;  Surgeon: Eustace Moore, MD;  Location: [facility];  Service: Neurosurgery;  Laterality: Bilateral;  . SPLENECTOMY    . TIBIA FRACTURE SURGERY Right   . TIBIA FRACTURE SURGERY Left   . TOTAL KNEE ARTHROPLASTY Right 2013    SOCIAL HISTORY:   Social History   Socioeconomic History  . Marital status: Married    Spouse name: Not on file  . Number of children: Not on file  . Years of education: 1  . Highest education level: Associate degree: occupational, Hotel manager, or vocational program  Occupational History  . Occupation: Retired    Comment: Network engineer, Youth worker   Social Needs  . Financial resource strain: Not on file  . Food insecurity:    Worry: Not on file    Inability: Not on file  . Transportation needs:    Medical: Not on file    Non-medical: Not on file  Tobacco Use  . Smoking status: Never Smoker  . Smokeless tobacco: Never Used  Substance and Sexual Activity  . Alcohol use: No    Alcohol/week: 2.0 standard drinks    Types: 2 Glasses of wine per week    Comment: wine on occ   . Drug use: No  . Sexual activity: Not Currently  Lifestyle  . Physical activity:    Days per week: Not on file    Minutes per session: Not on file  . Stress: Not on file  Relationships  . Social connections:    Talks on phone: Not on file    Gets together: Not on file    Attends religious service: Not on file    Active member of club or organization: Not on file    Attends meetings of clubs or organizations: Not on file    Relationship status: Not on file  . Intimate partner violence:    Fear of current or ex partner: Not on file    Emotionally abused: Not on file    Physically abused: Not on file    Forced sexual activity: Not on file  Other Topics Concern  . Not on file  Social History Narrative   Drink tea daily and coffee on occ    FAMILY HISTORY:   Family Status  Relation Name Status  . Mother  Deceased  . Father 45 Deceased  . Daughter  Alive  . Son   Deceased       cause unknown  . Brother  Alive  . Neg Hx  (Not Specified)    ROS:  ROS  PHYSICAL EXAMINATION:    VITALS:   Vitals:   05/13/18 1521  BP: (!) 168/96  Pulse: 96  Temp: 98.3 F (36.8 C)  TempSrc: Oral  SpO2: 91%  Weight: 174 lb (78.9 kg)  Height: 5' 1.5" (1.562 m)  GEN:  The patient appears stated age and is in NAD. HEENT:  Normocephalic, atraumatic.  The mucous membranes are moist. The superficial temporal arteries are without ropiness or tenderness. CV:  RRR Lungs:  CTAB Neck/HEME:  There are no carotid bruits bilaterally.   MS:  There is hypertrophy of the trap mm on the left.  Head is held forward and flexed  Neurological examination:  Orientation:  Montreal Cognitive Assessment  05/13/2018  Visuospatial/ Executive (0/5) 3  Naming (0/3) 2  Attention: Read list of digits (0/2) 2  Attention: Read list of letters (0/1) 0  Attention: Serial 7 subtraction starting at 100 (0/3) 3  Language: Repeat phrase (0/2) 2  Language : Fluency (0/1) 0  Abstraction (0/2) 2  Delayed Recall (0/5) 1  Orientation (0/6) 6  Total 21   Cranial nerves: There is good facial symmetry. Pupils are equal round and reactive to light bilaterally. Fundoscopic exam reveals clear margins bilaterally. Extraocular muscles are intact. The visual fields are full to confrontational testing. The speech is fluent and clear. Soft palate rises symmetrically and there is no tongue deviation. Hearing is intact to conversational tone. Sensation: Sensation is intact to light and pinprick throughout (facial, trunk, extremities). Vibration is intact at the bilateral big toe. There is no extinction with double simultaneous stimulation. There is no sensory dermatomal level identified. Motor: Strength is 5/5 in the bilateral upper and lower extremities (with encouragement - has pain in the LE with MMT).   Shoulder shrug is equal and symmetric.  There is no pronator drift.   Movement examination: Tone:  There is normal tone in the bilateral upper extremities.  The tone in the lower extremities is normal.  Abnormal movements:  there is bilateral UE resting tremor that increases with distraction.  There is R foot tremor.   There is no tremor of the outstretched hands.   Coordination:  There is decremation with RAM, with any form of RAMS, including alternating supination and pronation of the forearm, hand opening and closing, finger taps, heel taps and toe taps, L more than R Gait and Station: The patient pushes off of the chair.  She is stooped.  She walks with the cane.    Labs:   Chemistry      Component Value Date/Time   NA 142 02/28/2018 1544   K 4.2 02/28/2018 1544   CL 104 02/28/2018 1544   CO2 31 02/28/2018 1544   BUN 21 02/28/2018 1544   CREATININE 0.88 02/28/2018 1544      Component Value Date/Time   CALCIUM 8.7 (L) 02/28/2018 1544   ALKPHOS 49 02/28/2018 1544   AST 13 (L) 02/28/2018 1544   ALT 13 02/28/2018 1544   BILITOT 0.6 02/28/2018 1544     Lab Results  Component Value Date   TSH 0.82 02/12/2017     ASSESSMENT/PLAN:  1.  Tremor predominant Parkinson's disease  -Long discussion with patient and her husband.  Talked to her about compliance with medication and taking this on a regular schedule.  Talked to her about waking up at a fairly normal hour (currently waking up at 2 PM).  We ultimately settled on getting out of the bed by 10 AM.  I asked her to take her carbidopa/levodopa 25/100, 1 tablet at 10 AM/2 PM/6 PM.  -I will refer the patient to the Parkinson's program at the [facility], for PT/OT.  We talked about the importance of safe, cardiovascular exercise in Parkinson's disease.  -We discussed community resources  in the area including patient support groups and community exercise programs for PD and pt education was provided to the patient.  -Once patient gets home, she is to call me back and let me know she is still on the primidone.  If so, I  will likely wean her off of this.  I do not think she needs it.  I do not think it is helping.  It potentially could be interfering with her memory.  She will not stop it, as it will likely need to be weaned if she is on it.  2.  Low back pain  -Discussed with her that her low back pain is independent of parkinsonism.  This really is her biggest complaint.  She is seeing [facility] and is considering surgery.    3.  REM behavior disorder  -This is commonly associated with PD and the patient is experiencing this.  We discussed that this can be very serious and even harmful.  We talked about medications as well as physical barriers to put in the bed (particularly soft bed rails, pillow barriers).  We talked about moving the night stand so that it is not so close to the side of the bed.  I asked them to go ahead and order the bed rails as she has fallen out of the bed.  I really do not want to add more medication right now.  4.  Memory loss  -Patient seemed quite confused in the room today.  Her primary care to started Aricept.  She is on some medication that could potentially interfere with memory as well.  As above, we talked about the importance of regular daily schedule as well as regular exercise.  We talked about risks of day/night reversal.  -We will put her on the list for neurocognitive testing once [name] joined the practice.  5.  Follow up is anticipated in the next few months, sooner should new neurologic issues arise.  Much greater than 50% of this visit was spent in counseling and coordinating care.  Total face to face time:  40 min   Cc:  Plotnikov, Evie Lacks, MD

## 2018-05-13 ENCOUNTER — Ambulatory Visit (INDEPENDENT_AMBULATORY_CARE_PROVIDER_SITE_OTHER): Payer: Medicare Other | Admitting: Neurology

## 2018-05-13 ENCOUNTER — Other Ambulatory Visit: Payer: Self-pay

## 2018-05-13 ENCOUNTER — Encounter: Payer: Self-pay | Admitting: Neurology

## 2018-05-13 VITALS — BP 168/96 | HR 96 | Temp 98.3°F | Ht 61.5 in | Wt 174.0 lb

## 2018-05-13 DIAGNOSIS — G4752 REM sleep behavior disorder: Secondary | ICD-10-CM | POA: Diagnosis not present

## 2018-05-13 DIAGNOSIS — G20 Parkinson's disease: Secondary | ICD-10-CM | POA: Diagnosis not present

## 2018-05-13 DIAGNOSIS — R413 Other amnesia: Secondary | ICD-10-CM | POA: Diagnosis not present

## 2018-05-13 DIAGNOSIS — G20A1 Parkinson's disease without dyskinesia, without mention of fluctuations: Secondary | ICD-10-CM

## 2018-05-13 NOTE — Patient Instructions (Addendum)
Check when you get home to see if you are on primidone.  If you are, call me back and let me know.  Change dosage of your medication to 10 am, 2 pm, and 6 pm.

## 2018-05-15 ENCOUNTER — Other Ambulatory Visit: Payer: Self-pay | Admitting: Internal Medicine

## 2018-05-16 ENCOUNTER — Telehealth: Payer: Self-pay | Admitting: Neurology

## 2018-05-16 NOTE — Telephone Encounter (Signed)
Spoke with patient. She states she is NOT taking Primidone. Made her aware I would let [name] know.  She also states she was told that she would wean off Aricept at her visit, but no instructions written. I reviewed her note. I let her know I think she is confused as it said if she was on Primidone that she would need to wean off that, but she is very insistent and wanted me to double check with [name] that she doesn't need to stop this medication. [name] please advise.

## 2018-05-16 NOTE — Telephone Encounter (Signed)
Patient is calling and needs to give some information about some medication  Please call

## 2018-05-16 NOTE — Telephone Encounter (Signed)
She asked me several times about taking off of the aricept but I did not say that.  I was weaning her off of the primidone, IF she was on it.  I told her to just go home and check if she was on it.  I never planned to change her aricept as her PCP just started her on this.  I did plan on doing neurocognitive testing, as I don't think that her memory is as good as it was.

## 2018-05-16 NOTE — Telephone Encounter (Signed)
Patient made aware.

## 2018-05-25 ENCOUNTER — Other Ambulatory Visit: Payer: Self-pay | Admitting: *Deleted

## 2018-05-25 MED ORDER — CARBIDOPA-LEVODOPA 25-100 MG PO TABS: 1.0000 | ORAL_TABLET | Freq: Three times a day (TID) | ORAL | 3 refills | Status: DC

## 2018-05-26 ENCOUNTER — Telehealth: Payer: Self-pay | Admitting: Internal Medicine

## 2018-05-26 ENCOUNTER — Other Ambulatory Visit: Payer: Self-pay

## 2018-05-26 MED ORDER — GABAPENTIN 300 MG PO CAPS: ORAL_CAPSULE | ORAL | 3 refills | Status: DC

## 2018-05-26 MED ORDER — MONTELUKAST SODIUM 10 MG PO TABS: 10.0000 mg | ORAL_TABLET | Freq: Every day | ORAL | 3 refills | Status: DC

## 2018-05-26 MED ORDER — VENLAFAXINE HCL ER 150 MG PO CP24: ORAL_CAPSULE | ORAL | 3 refills | Status: DC

## 2018-05-26 MED ORDER — DONEPEZIL HCL 5 MG PO TABS: 5.0000 mg | ORAL_TABLET | Freq: Every day | ORAL | 3 refills | Status: DC

## 2018-05-26 MED ORDER — VERAPAMIL HCL ER 120 MG PO CP24: 120.0000 mg | ORAL_CAPSULE | Freq: Every day | ORAL | 3 refills | Status: DC

## 2018-05-26 NOTE — Telephone Encounter (Signed)
Copied from [place] [phone]. Topic: Quick Communication - Rx Refill/Question >> May 26, 2018  2:39 PM Mallory Klein wrote: Medication: pt notes that [employer] notified her PA is required for her 5 medications from Dr. Alain Marion - PA ph# is [phone]  Has the patient contacted their pharmacy? yes  Preferred Pharmacy (with phone number or street name): [place], [place] [employer] [phone] (Phone) [phone] (Fax)

## 2018-06-08 ENCOUNTER — Telehealth: Payer: Self-pay | Admitting: Internal Medicine

## 2018-06-08 NOTE — Telephone Encounter (Signed)
Copied from [place] [phone] [phone]. Topic: Quick Communication - Rx Refill/Question >> Jun 08, 2018  3:48 PM Sheran Luz wrote: Medication:  verapamil (VERELAN) 120 MG 24 hr capsule  Patient states that this medication is too expensive. She would like to know if there is a medication in another tier that she is able to try. Please advise.

## 2018-06-08 NOTE — Telephone Encounter (Addendum)
Relation to pt: self  Call back number: [phone] Pharmacy: Provider [phone] verbal orders [place], [address] [place] [address]  Phone: [phone] Fax: [phone]  Not a 24 hour pharmacy; exact hours not known.     Reason for call:  Patient would like a 90 day supply verapamil (VERELAN) 120 MG 24 hr capsule. Chart reflects Rx was sent on 05/26/2018 mail order denies receiving, please resend Rx, please advise

## 2018-06-09 NOTE — Telephone Encounter (Signed)
verapamil (VERELAN) 120 MG 24 hr capsule not covered, alternative medication listed below

## 2018-06-09 NOTE — Telephone Encounter (Signed)
Relation to pt: self  Call back number: [phone] Pharmacy: [place], [address] [place] [address]  Phone: [phone] Fax: [phone]  Not a 24 hour pharmacy; exact hours not known.      Reason for call:  Patient contacted her pharamacy in the following medication will be cover, 90 day supply for each medication: Benicar, valsartan, cozaar, please advise patient with PCP recommendation

## 2018-06-09 NOTE — Telephone Encounter (Signed)
Pt will contact insurance and let us know what the cheaper alternative is

## 2018-06-13 NOTE — Telephone Encounter (Signed)
Is Cardizem ER covered? Thx

## 2018-06-13 NOTE — Telephone Encounter (Signed)
Pt called to get update on this request.  Please call pt.

## 2018-06-14 NOTE — Telephone Encounter (Signed)
Benicar, valsartan, cozaar are the covered medicaions

## 2018-06-15 MED ORDER — OLMESARTAN MEDOXOMIL 20 MG PO TABS: 20.0000 mg | ORAL_TABLET | Freq: Every day | ORAL | 3 refills | Status: DC

## 2018-06-15 NOTE — Telephone Encounter (Signed)
Patient is calling to get the status from Dr. Alain Marion as to which medication he wants her to take out of the ones that are covered under her insurance.  Patient is about to run out of her current BP meds and does not want to be without her medication.  The script should be sent to [facility].  Please advise and let patient know which medication she should take.  CB# [phone]

## 2018-06-15 NOTE — Telephone Encounter (Signed)
Benicar,valsartan,cozaar are the covered medications

## 2018-06-15 NOTE — Telephone Encounter (Signed)
Noted D/c Verapamil Start Benicar OV or VOV in 6 wks Thx

## 2018-06-16 MED ORDER — OLMESARTAN MEDOXOMIL 20 MG PO TABS: 20.0000 mg | ORAL_TABLET | Freq: Every day | ORAL | 3 refills | Status: DC

## 2018-06-16 NOTE — Telephone Encounter (Signed)
Pt notified, appt scheduled

## 2018-06-23 ENCOUNTER — Ambulatory Visit: Payer: Medicare Other | Admitting: Internal Medicine

## 2018-06-29 NOTE — Telephone Encounter (Signed)
Pt calling back to advise the olmesartan (BENICAR) 20 MG tablet made her sick on her stomach, drunk feeling, and even dizzy at times. (pt states she is feeling bette today) Pt states she called her insurance company who advised pt they are going to fax the dr a prior auth request for her to go back on verapamil.  Pt states in the meantime, since she cannot take this other med, can she get a few verapamil sent to local pharmacy?  She has 180 mg verapamil, but she is currently 120 mg verapamil.  She states she could take that if dr states ok.  Please advise. Short supply  [id number] [place], [place] - [address] [phone] (Phone) [phone] (Fax)   regular Rx  [place], [place] [employer] [phone] (Phone) [phone] (Fax)

## 2018-06-30 ENCOUNTER — Telehealth: Payer: Self-pay | Admitting: Internal Medicine

## 2018-06-30 NOTE — Telephone Encounter (Signed)
Please call pt to clarify med

## 2018-06-30 NOTE — Telephone Encounter (Signed)
Please advise about med 

## 2018-06-30 NOTE — Telephone Encounter (Signed)
Okay to go back on verapamil.  Discontinue Benicar Thanks

## 2018-06-30 NOTE — Telephone Encounter (Signed)
Patient is calling in stating she would like the verapamil 240 mg. To be sent to [id number] - [place], [place] - [address] [phone] (Phone) [phone] (Fax)

## 2018-06-30 NOTE — Telephone Encounter (Signed)
Patient states she just wants dr plotnikov to know that she has stopped new bp med he wanted her to try, olmesartan,--she did not do well taking it, felt very dizzy and nauseated, off balance at times, she is going back to her old bp medication verapamil, even though insurance does not cover the cost, she's going to pay for it out of pocket---patient has already talked with pharmacy, the original rx we sent is still good, so she will pick up verapamil today, no further action needed by our office----routing to dr plotnikov, fyi.Marland KitchenMarland KitchenMarland Kitchen

## 2018-06-30 NOTE — Telephone Encounter (Addendum)
Copied from [place] [id number]. Topic: General - Other >> Jun 30, 2018  2:01 PM Lennox Solders wrote: Reason for CRM: pt is calling and she said [employer] rx fax md a form yesterday. Pt would like to go back on verapamil 180 if its ok with md. Pt has not had bp med since Monday. Pt does have verapamil 240 mg on hand. Please advise. Pt no longer want to take olmesartan. Pt does not like the way it makes her feel sick to her stomach. Pt will call [employer] rx and have them refax another form

## 2018-07-06 NOTE — Telephone Encounter (Signed)
Pt notified to keep OV

## 2018-07-16 ENCOUNTER — Other Ambulatory Visit: Payer: Self-pay | Admitting: Neurology

## 2018-07-19 NOTE — Telephone Encounter (Signed)
Requested Prescriptions   Pending Prescriptions Disp Refills  . carbidopa-levodopa (SINEMET IR) 25-100 MG tablet [Pharmacy Med Name: CARBIDOPA-LEVODOPA 25-100 TAB] 270 tablet 1    Sig: TAKE 1 TABLET BY MOUTH THREE TIMES A DAY   Rx last filled: 05/25/18 #270 3 refills  Pt last seen:05/13/18  Follow up appt scheduled:11/15/18   RX DENIED RX IS ON FILE WITH MAIL ORDER PHARMACY

## 2018-07-27 ENCOUNTER — Other Ambulatory Visit: Payer: Self-pay

## 2018-07-27 NOTE — Telephone Encounter (Signed)
Requested Prescriptions   Pending Prescriptions Disp Refills  . carbidopa-levodopa (SINEMET IR) 25-100 MG tablet 270 tablet 3    Sig: Take 1 tablet by mouth 3 (three) times daily.  local pharmacy requesting refill on Rx   Rx DENIED Rx was sent to [facility] on 05/25/18 for #270 3 refills  Pt last seen: 05/13/18 Follow up: 11/15/18

## 2018-07-28 ENCOUNTER — Ambulatory Visit (INDEPENDENT_AMBULATORY_CARE_PROVIDER_SITE_OTHER): Payer: Medicare Other | Admitting: Internal Medicine

## 2018-07-28 ENCOUNTER — Encounter: Payer: Self-pay | Admitting: Internal Medicine

## 2018-07-28 DIAGNOSIS — G20A1 Parkinson's disease without dyskinesia, without mention of fluctuations: Secondary | ICD-10-CM

## 2018-07-28 DIAGNOSIS — R42 Dizziness and giddiness: Secondary | ICD-10-CM | POA: Diagnosis not present

## 2018-07-28 DIAGNOSIS — R32 Unspecified urinary incontinence: Secondary | ICD-10-CM

## 2018-07-28 DIAGNOSIS — F411 Generalized anxiety disorder: Secondary | ICD-10-CM

## 2018-07-28 DIAGNOSIS — G20 Parkinson's disease: Secondary | ICD-10-CM

## 2018-07-28 DIAGNOSIS — J4540 Moderate persistent asthma, uncomplicated: Secondary | ICD-10-CM | POA: Diagnosis not present

## 2018-07-28 DIAGNOSIS — I10 Essential (primary) hypertension: Secondary | ICD-10-CM

## 2018-07-28 DIAGNOSIS — D500 Iron deficiency anemia secondary to blood loss (chronic): Secondary | ICD-10-CM

## 2018-07-28 MED ORDER — VERAPAMIL HCL ER 120 MG PO CP24: 120.0000 mg | ORAL_CAPSULE | Freq: Every day | ORAL | 3 refills | Status: DC

## 2018-07-28 MED ORDER — CETIRIZINE HCL 10 MG PO TABS: 10.0000 mg | ORAL_TABLET | Freq: Every day | ORAL | 3 refills | Status: DC

## 2018-07-28 NOTE — Assessment & Plan Note (Signed)
Chronic.  On p.o. iron

## 2018-07-28 NOTE — Assessment & Plan Note (Signed)
Breo, Singulair, Zyrtec

## 2018-07-28 NOTE — Assessment & Plan Note (Signed)
Benicar was discontinued due to side effects.  Verapamil was restarted.  Doing well tolerating verapamil now.  Check blood pressure at home

## 2018-07-28 NOTE — Assessment & Plan Note (Signed)
On Sinemet.  Mallory Klein is not sure if she is better on Sinemet.  Follow-up with [name] is coming up

## 2018-07-28 NOTE — Progress Notes (Signed)
Virtual Visit via Video Note  I connected with Mallory Klein on 07/28/18 at  2:40 PM EDT by a video enabled telemedicine application and verified that I am speaking with the correct person using two identifiers.   I discussed the limitations of evaluation and management by telemedicine and the availability of in person appointments. The patient expressed understanding and agreed to proceed.  History of Present Illness: We need to follow-up on asthma, hypertension, allergies, tremor  There has been no runny nose, bad cough, chest pain, recent shortness of breath, abdominal pain, diarrhea, constipation, skin rashes.   Observations/Objective: The patient appears to be in no acute distress, looks well.  Hand tremor is present  Assessment and Plan:  See my Assessment and Plan. Follow Up Instructions:    I discussed the assessment and treatment plan with the patient. The patient was provided an opportunity to ask questions and all were answered. The patient agreed with the plan and demonstrated an understanding of the instructions.   The patient was advised to call back or seek an in-person evaluation if the symptoms worsen or if the condition fails to improve as anticipated.  I provided face-to-face time during this encounter. We were at different locations.   Walker Kehr, MD

## 2018-07-28 NOTE — Assessment & Plan Note (Signed)
Alprazolam as needed 

## 2018-07-28 NOTE — Assessment & Plan Note (Signed)
Follow-up with urology.  So far no help with different pharmaceuticals

## 2018-07-28 NOTE — Assessment & Plan Note (Signed)
Recurrent.  No problems at present

## 2018-08-04 ENCOUNTER — Inpatient Hospital Stay: Payer: Medicare Other | Attending: Hematology

## 2018-08-04 ENCOUNTER — Other Ambulatory Visit: Payer: Self-pay

## 2018-08-04 DIAGNOSIS — F419 Anxiety disorder, unspecified: Secondary | ICD-10-CM | POA: Diagnosis not present

## 2018-08-04 DIAGNOSIS — J45909 Unspecified asthma, uncomplicated: Secondary | ICD-10-CM | POA: Diagnosis not present

## 2018-08-04 DIAGNOSIS — Z79899 Other long term (current) drug therapy: Secondary | ICD-10-CM | POA: Diagnosis not present

## 2018-08-04 DIAGNOSIS — G20 Parkinson's disease: Secondary | ICD-10-CM | POA: Diagnosis not present

## 2018-08-04 DIAGNOSIS — D649 Anemia, unspecified: Secondary | ICD-10-CM

## 2018-08-04 DIAGNOSIS — I10 Essential (primary) hypertension: Secondary | ICD-10-CM | POA: Diagnosis not present

## 2018-08-04 DIAGNOSIS — R7989 Other specified abnormal findings of blood chemistry: Secondary | ICD-10-CM | POA: Insufficient documentation

## 2018-08-04 DIAGNOSIS — D508 Other iron deficiency anemias: Secondary | ICD-10-CM

## 2018-08-04 DIAGNOSIS — D509 Iron deficiency anemia, unspecified: Secondary | ICD-10-CM | POA: Insufficient documentation

## 2018-08-04 LAB — CBC WITH DIFFERENTIAL (CANCER CENTER ONLY)
Abs Immature Granulocytes: 0.01 10*3/uL (ref 0.00–0.07)
Basophils Absolute: 0 10*3/uL (ref 0.0–0.1)
Basophils Relative: 1 %
Eosinophils Absolute: 0.2 10*3/uL (ref 0.0–0.5)
Eosinophils Relative: 3 %
HCT: 44.3 % (ref 36.0–46.0)
Hemoglobin: 14 g/dL (ref 12.0–15.0)
Immature Granulocytes: 0 %
Lymphocytes Relative: 33 %
Lymphs Abs: 2.7 10*3/uL (ref 0.7–4.0)
MCH: 29.1 pg (ref 26.0–34.0)
MCHC: 31.6 g/dL (ref 30.0–36.0)
MCV: 92.1 fL (ref 80.0–100.0)
Monocytes Absolute: 0.7 10*3/uL (ref 0.1–1.0)
Monocytes Relative: 9 %
Neutro Abs: 4.7 10*3/uL (ref 1.7–7.7)
Neutrophils Relative %: 54 %
Platelet Count: 400 10*3/uL (ref 150–400)
RBC: 4.81 MIL/uL (ref 3.87–5.11)
RDW: 15.3 % (ref 11.5–15.5)
WBC Count: 8.4 10*3/uL (ref 4.0–10.5)
nRBC: 0 % (ref 0.0–0.2)

## 2018-08-04 LAB — RETICULOCYTES
Immature Retic Fract: 13.8 % (ref 2.3–15.9)
RBC.: 4.82 MIL/uL (ref 3.87–5.11)
Retic Count, Absolute: 74.7 10*3/uL (ref 19.0–186.0)
Retic Ct Pct: 1.6 % (ref 0.4–3.1)

## 2018-08-04 LAB — FERRITIN: Ferritin: 32 ng/mL (ref 11–307)

## 2018-08-04 LAB — IRON AND TIBC
Iron: 96 ug/dL (ref 41–142)
Saturation Ratios: 29 % (ref 21–57)
TIBC: 333 ug/dL (ref 236–444)
UIBC: 237 ug/dL (ref 120–384)

## 2018-08-05 ENCOUNTER — Telehealth: Payer: Self-pay | Admitting: *Deleted

## 2018-08-05 NOTE — Telephone Encounter (Signed)
-----   Message from Alla Feeling, NP sent at 08/04/2018  4:50 PM EDT ----- Please let her know all labs WNL. No anemia or iron deficiency. Thanks, lacie NP

## 2018-08-05 NOTE — Telephone Encounter (Signed)
Called pt & informed of normal labs per Cira Rue NP message.  She asked if she should cont her iron & informed to cont for now & discuss with MD/NP at next visit.  She expressed understanding.

## 2018-09-28 DIAGNOSIS — M25562 Pain in left knee: Secondary | ICD-10-CM | POA: Diagnosis not present

## 2018-09-28 DIAGNOSIS — M1712 Unilateral primary osteoarthritis, left knee: Secondary | ICD-10-CM | POA: Diagnosis not present

## 2018-10-05 DIAGNOSIS — M25562 Pain in left knee: Secondary | ICD-10-CM | POA: Diagnosis not present

## 2018-10-05 DIAGNOSIS — M1712 Unilateral primary osteoarthritis, left knee: Secondary | ICD-10-CM | POA: Diagnosis not present

## 2018-10-12 DIAGNOSIS — M1712 Unilateral primary osteoarthritis, left knee: Secondary | ICD-10-CM | POA: Diagnosis not present

## 2018-10-12 DIAGNOSIS — M25562 Pain in left knee: Secondary | ICD-10-CM | POA: Diagnosis not present

## 2018-10-13 ENCOUNTER — Ambulatory Visit: Payer: Self-pay | Admitting: *Deleted

## 2018-10-13 NOTE — Telephone Encounter (Signed)
Husband, Mallory Klein called in for wife.   I returned his call. Cherylanne went to [facility] yesterday and had her knee injected.   About 2-4 hours after the injection she got a headache and the hearing in both ears is very diminished .    They called the [facility] place and told them what happened and they instructed the [facility] to call their PCP.  See triage notes.     I called into the office and spoke with Lovena Le.   Dr. Alain Marion is double booked today.   The protocol is be seen within 4 hrs or PCP approval so I have referred her to the ED.    They are going to [place] said he would keep [place] posted.  I sent my triage notes to Dr. Alain Marion.  Reason for Disposition . [1] Hearing loss in one or both ears AND [2] sudden onset AND [3] present now  Answer Assessment - Initial Assessment Questions 1. DESCRIPTION: "What type of hearing problem are you having? Describe it for me." (e.g., complete hearing loss, partial loss)     Yesterday had injection in knee at [facility] in left knee.    She now has a headache and diminished both  hearing.   Husband called [facility] and told them what happened and they said to call PCP. 2. LOCATION: "One or both ears?" If one, ask: "Which ear?"     Both ears diminished hearing and a headache. 3. SEVERITY: "Can you hear anything?" If so, ask: "What can you hear?" (e.g., ticking watch, whisper, talking)     She can hear husband talking to her but it's diminished. 4. ONSET: "When did this begin?" "Did it start suddenly or come on gradually?"     2-4 hours after the injection. 5. PATTERN: "Does this come and go, or has it been constant since it started?"     constant 6. PAIN: "Is there any pain in your ear(s)?"  (Scale 1-10; or mild, moderate, severe)     A little in ears but thinks it's the headache. 7. CAUSE: "What do you think is causing this hearing problem?"     From the knee injection 8. OTHER SYMPTOMS: "Do you have any  other symptoms?" (e.g., dizziness, ringing in ears)     No    9. PREGNANCY: "Is there any chance you are pregnant?" "When was your last menstrual period?"    N/A  Protocols used: HEARING LOSS OR CHANGE-A-AH

## 2018-10-17 NOTE — Telephone Encounter (Signed)
Message from Sharene Skeans sent at 10/17/2018 12:37 PM EDT  Summary: hearing loss in right ear    Pt called this afternoon to schedule an appt with Dr. Alain Marion. Pt stated she contacted the provider from [facility] and they said it has nothing to do with her injection and to f/u with PCP about hearing loss. Pt states she only has the issue with her right ear and would like to speak with a nurse about this issue. Pt didn't go to ER and doesn't want to go due to covid. / please advise           Returned call to pt. To inquire about her continued symptoms.  Reported she had the sudden onset of decreased hearing in the right ear since Wednesday, 8/19.   Describe right ear as hearing a noise that keeps her from hearing.  Stated the noise sounds like wind.  Reported she had a headache on Wednesday, but that has resolved.  Denied earache/ pain.  Denied fever, sore throat, cough, or shortness of breath.  Denied sinus pressure.  Denied any exposure to COVID positive individual.  Stated she spoke with nurse on Thursday, 8/20, and was advised to go to ER, but decided she did not want to be exposed to [place], so did not go. Has appt. On 8/26, but requesting to be seen sooner.  Called office [facility]; advised Dr. Alain Marion does not have any openings sooner than 8/26.  Per pt. She preferred to see only Dr. Alain Marion.  Advised pt. To keep appt. On 8/26.  Advised to call back to office, or seek ER care if develops pain in right ear, drainage, or fever.  Advised against probing with anything in the right ear.  Pt. Verb. Understanding.

## 2018-10-18 NOTE — Telephone Encounter (Signed)
Noted. Thx.

## 2018-10-19 ENCOUNTER — Ambulatory Visit (INDEPENDENT_AMBULATORY_CARE_PROVIDER_SITE_OTHER): Payer: Medicare Other | Admitting: Internal Medicine

## 2018-10-19 ENCOUNTER — Encounter: Payer: Self-pay | Admitting: Internal Medicine

## 2018-10-19 ENCOUNTER — Other Ambulatory Visit: Payer: Self-pay

## 2018-10-19 VITALS — BP 150/88 | HR 91 | Temp 98.3°F | Ht 61.5 in | Wt 172.0 lb

## 2018-10-19 DIAGNOSIS — Z23 Encounter for immunization: Secondary | ICD-10-CM

## 2018-10-19 DIAGNOSIS — H6123 Impacted cerumen, bilateral: Secondary | ICD-10-CM | POA: Diagnosis not present

## 2018-10-19 DIAGNOSIS — H6120 Impacted cerumen, unspecified ear: Secondary | ICD-10-CM | POA: Insufficient documentation

## 2018-10-19 NOTE — Assessment & Plan Note (Signed)
Will irrigate 

## 2018-10-19 NOTE — Progress Notes (Signed)
Patient consent obtained. Irrigation with water and peroxide performed. Full view of tympanic membranes after procedure.  Patient tolerated procedure well.   

## 2018-10-19 NOTE — Progress Notes (Signed)
Subjective:  Patient ID: Mallory Klein, female    DOB: 12/10/1945  Age: 73 y.o. MRN: [id number]  CC: Hearing Problem (Right ear since last week)   HPI Mallory Klein presents for R ear clogged up F/o knee OA - going to [facility] F/u tremor   Outpatient Medications Prior to Visit  Medication Sig Dispense Refill   albuterol (PROVENTIL HFA;VENTOLIN HFA) 108 (90 Base) MCG/ACT inhaler Inhale 2 puffs into the lungs every 6 (six) hours as needed for wheezing or shortness of breath. 1 Inhaler 6   carbidopa-levodopa (SINEMET IR) 25-100 MG tablet Take 1 tablet by mouth 3 (three) times daily. 270 tablet 3   cetirizine (ZYRTEC) 10 MG tablet Take 1 tablet (10 mg total) by mouth daily. 90 tablet 3   Cholecalciferol (VITAMIN D3) 1000 UNITS tablet Take 2 tablets by mouth once daily      donepezil (ARICEPT) 5 MG tablet Take 1 tablet (5 mg total) by mouth at bedtime. 90 tablet 3   gabapentin (NEURONTIN) 300 MG capsule TAKE 1 CAPSULE BY MOUTH THREE TIMES A DAY 270 capsule 3   IRON PO Take 1 tablet by mouth daily.     montelukast (SINGULAIR) 10 MG tablet Take 1 tablet (10 mg total) by mouth daily. 90 tablet 3   venlafaxine XR (EFFEXOR-XR) 150 MG 24 hr capsule TAKE 1 CAPSULE BY MOUTH EVERY DAY WITH BREAKFAST 90 capsule 3   verapamil (VERELAN) 120 MG 24 hr capsule Take 1 capsule (120 mg total) by mouth at bedtime. 90 capsule 3   No facility-administered medications prior to visit.     ROS: Review of Systems  Constitutional: Negative for activity change, appetite change, chills, fatigue and unexpected weight change.  HENT: Positive for hearing loss. Negative for congestion, mouth sores and sinus pressure.   Eyes: Negative for visual disturbance.  Respiratory: Negative for cough and chest tightness.   Gastrointestinal: Negative for abdominal pain and nausea.  Genitourinary: Negative for difficulty urinating, frequency and vaginal pain.  Musculoskeletal: Positive for arthralgias, back  pain and gait problem.  Skin: Negative for pallor and rash.  Neurological: Positive for tremors. Negative for dizziness, weakness, numbness and headaches.  Psychiatric/Behavioral: Negative for confusion, sleep disturbance and suicidal ideas. The patient is nervous/anxious.     Objective:  BP (!) 150/88 (BP Location: Left Arm, Patient Position: Sitting, Cuff Size: Large)    Pulse 91    Temp 98.3 F (36.8 C) (Oral)    Ht 5' 1.5" (1.562 m)    Wt 172 lb (78 kg)    SpO2 96%    BMI 31.97 kg/m   BP Readings from Last 3 Encounters:  10/19/18 (!) 150/88  05/13/18 (!) 168/96  04/07/18 (!) 155/85    Wt Readings from Last 3 Encounters:  10/19/18 172 lb (78 kg)  05/13/18 174 lb (78.9 kg)  04/07/18 176 lb 6.4 oz (80 kg)    Physical Exam Constitutional:      General: She is not in acute distress.    Appearance: She is well-developed.  HENT:     Head: Normocephalic.     Right Ear: External ear normal.     Left Ear: External ear normal.     Nose: Nose normal.  Eyes:     General:        Right eye: No discharge.        Left eye: No discharge.     Conjunctiva/sclera: Conjunctivae normal.     Pupils: Pupils are equal, round, and  reactive to light.  Neck:     Musculoskeletal: Normal range of motion and neck supple.     Thyroid: No thyromegaly.     Vascular: No JVD.     Trachea: No tracheal deviation.  Cardiovascular:     Rate and Rhythm: Normal rate and regular rhythm.     Heart sounds: Normal heart sounds.  Pulmonary:     Effort: No respiratory distress.     Breath sounds: No stridor. No wheezing.  Abdominal:     General: Bowel sounds are normal. There is no distension.     Palpations: Abdomen is soft. There is no mass.     Tenderness: There is no abdominal tenderness. There is no guarding or rebound.  Musculoskeletal:        General: Deformity present. No tenderness.  Lymphadenopathy:     Cervical: No cervical adenopathy.  Skin:    Findings: No erythema or rash.  Neurological:       Mental Status: She is oriented to person, place, and time.     Cranial Nerves: No cranial nerve deficit.     Motor: No abnormal muscle tone.     Coordination: Coordination abnormal.     Gait: Gait abnormal.     Deep Tendon Reflexes: Reflexes normal.  Psychiatric:        Behavior: Behavior normal.        Thought Content: Thought content normal.        Judgment: Judgment normal.   B wax Cane Tremor in hands     Procedure Note :     Procedure :  Ear irrigation right and left ears   Indication:  Cerumen impaction right and left ears   Risks, including pain, dizziness, eardrum perforation, bleeding, infection and others as well as benefits were explained to the patient in detail. Verbal consent was obtained and the patient agreed to proceed.    We used "The Elephant Ear Irrigation Device" filled with lukewarm water for irrigation. A large amount wax was recovered from both ears. Procedure has also required manual wax removal/instrumentation with an ear wax curette and ear forceps on the right and left ears.   Tolerated well. Complications: None.   Postprocedure instructions :  Call if problems.    Lab Results  Component Value Date   WBC 8.4 08/04/2018   HGB 14.0 08/04/2018   HCT 44.3 08/04/2018   PLT 400 08/04/2018   GLUCOSE 101 (H) 02/28/2018   CHOL 219 (H) 02/05/2015   TRIG 69.0 02/05/2015   HDL 73.70 02/05/2015   LDLDIRECT 109.8 02/10/2010   LDLCALC 132 (H) 02/05/2015   ALT 13 02/28/2018   AST 13 (L) 02/28/2018   NA 142 02/28/2018   K 4.2 02/28/2018   CL 104 02/28/2018   CREATININE 0.88 02/28/2018   BUN 21 02/28/2018   CO2 31 02/28/2018   TSH 0.82 02/12/2017   INR 0.97 02/07/2014   HGBA1C 6.2 02/12/2017    Ct Abdomen Pelvis W Contrast  Result Date: 02/28/2018 CLINICAL DATA:  Abdominal pain EXAM: CT ABDOMEN AND PELVIS WITH CONTRAST TECHNIQUE: Multidetector CT imaging of the abdomen and pelvis was performed using the standard protocol following bolus  administration of intravenous contrast. CONTRAST:  19mL ISOVUE-300 IOPAMIDOL (ISOVUE-300) INJECTION 61% COMPARISON:  None. FINDINGS: LOWER CHEST: There is no basilar pleural or apical pericardial effusion. HEPATOBILIARY: The hepatic contours and density are normal. There is no intra- or extrahepatic biliary dilatation. The gallbladder is normal. PANCREAS: The pancreatic parenchymal contours are normal and  there is no ductal dilatation. There is no peripancreatic fluid collection. SPLEEN: Normal. ADRENALS/URINARY TRACT: --Adrenal glands: Normal. --Right kidney/ureter: No hydronephrosis, nephroureterolithiasis, perinephric stranding or solid renal mass. --Left kidney/ureter: No hydronephrosis, nephroureterolithiasis, perinephric stranding or solid renal mass. --Urinary bladder: Normal for degree of distention STOMACH/BOWEL: --Stomach/Duodenum: There is no hiatal hernia or other gastric abnormality. The duodenal course and caliber are normal. --Small bowel: No dilatation or inflammation. --Colon: No focal abnormality. --Appendix: Normal. VASCULAR/LYMPHATIC: There is aortic atherosclerosis without hemodynamically significant stenosis. The portal vein, splenic vein, superior mesenteric vein and IVC are patent. No abdominal or pelvic lymphadenopathy. REPRODUCTIVE: Status post hysterectomy. No adnexal mass. MUSCULOSKELETAL. Multilevel degenerative disc disease and facet arthrosis. No bony spinal canal stenosis. OTHER: None. IMPRESSION: No acute abdominal or pelvic abnormality. Electronically Signed   By: Ulyses Jarred M.D.   On: 02/28/2018 18:50    Assessment & Plan:   Ashlynd was seen today for hearing problem.  Diagnoses and all orders for this visit:  Need for influenza vaccination -     Flu Vaccine QUAD High Dose(Fluad)     No orders of the defined types were placed in this encounter.    Follow-up: No follow-ups on file.  Walker Kehr, MD

## 2018-10-20 ENCOUNTER — Other Ambulatory Visit: Payer: Self-pay | Admitting: Internal Medicine

## 2018-10-25 ENCOUNTER — Other Ambulatory Visit: Payer: Self-pay | Admitting: Internal Medicine

## 2018-10-25 NOTE — Telephone Encounter (Signed)
Pt states that Dr. Alain Marion was supposed to call in [place] 50mg  for her to  [id number] - [place], [place] - [address] [phone] (Phone) [phone] (Fax)   But that hasn't been done yet.

## 2018-10-26 ENCOUNTER — Telehealth: Payer: Self-pay | Admitting: Internal Medicine

## 2018-10-26 MED ORDER — PRIMIDONE 50 MG PO TABS: 50.0000 mg | ORAL_TABLET | Freq: Four times a day (QID) | ORAL | 5 refills | Status: DC

## 2018-10-26 NOTE — Telephone Encounter (Signed)
Copied from [place] [phone]. Topic: General - Call Back - No Documentation >> Oct 25, 2018  4:46 PM Erick Blinks wrote: Reason for CRM: Pt is requesting call back to discuss shingles vaccine. Please advise

## 2018-10-26 NOTE — Telephone Encounter (Signed)
I am sorry.  Prescription emailed to [facility].  Thank you

## 2018-10-27 NOTE — Telephone Encounter (Signed)
Called pt- she is schedule 10/28/18 for nurse visit for 1st shingrix.

## 2018-10-27 NOTE — Telephone Encounter (Signed)
Pt informed of below.  

## 2018-10-28 ENCOUNTER — Ambulatory Visit: Payer: Medicare Other

## 2018-10-28 NOTE — Telephone Encounter (Signed)
Copied from [place] [phone]. Topic: General - Call Back - No Documentation >> Oct 25, 2018  4:46 PM Erick Blinks wrote: Reason for CRM: Pt is requesting call back to discuss shingles vaccine. Please advise >> Oct 28, 2018  4:00 PM Mcneil, Jacinto Reap wrote: Pt requests a call back to discuss shingles vaccine. Pt requests to be contacted at [phone]  Patient wanted to know if she could get shingrix vaccine here at [facility]---with her primary insurance being medicare, she will need to get shot from a pharmacy, medicare uses part D to pay for this vaccine

## 2018-11-14 NOTE — Progress Notes (Signed)
Mallory Klein was seen today in the movement disorders clinic for neurologic consultation at the request of Plotnikov, Evie Lacks, MD.  The consultation is for the evaluation of tremor.  Records are reviewed.  The patient was seen on October 21, 2017 at orthopedics at [facility].  She was seen for back pain, but during the visit she complained about unsteady gait and tremor.  They were concerned about a possible progressive neurologic condition that would affect the outcome of possible surgery for the back.  She was therefore referred here. This patient is accompanied in the office by her spouse who supplements the history.  05/13/18 update: Patient seen today in follow-up for parkinsonism.  This patient is accompanied in the office by her spouse who supplements the history.  She was started on carbidopa/levodopa 25/100 last visit and worked to 1 tablet 3 times per day.  She has only taken 1 tablet today (seen at 3:20pm).  States that she does not wake up out of bed until about 2 PM.  No falls since last visit except she fell out of the bed while sleeping and that "one really hurt."  Husband states that she sleeps right on the edge of the bed.    No lightheadedness or near syncope.  No hallucinations.  She was seen in the emergency room on January 6 with rectal bleeding.  She was released with GI follow-up.  She was seen by primary care on January 30 and complained about memory change.  She was started on Aricept.  Records indicate that she has been on primidone, 50 mg 3 times per day for about a year by her primary care doctor.  She does not think that she is on it, but she is not sure.   11/15/18 update: Patient seen today in follow-up for Parkinson's disease. This patient is accompanied in the office by her spouse who supplements the history. Last visit, we talked about the importance of waking up at a normal hour (she was previously waking up at 2 PM) and discussed hours to take her carbidopa/levodopa 25/100.   We discussed that she should be taking it at 10 AM/2 PM/6 PM.  She reports that "I'm taking them at different times.  I don't wake up during the day."  She had one fall since our last visit.  She was trying to get up from a low bed and fell and hit her back on the edge of the dresser.    She does have a history of memory change and her primary care physician has her on donepezil.  She is on the waiting list for [name], but has not been scheduled yet because he has not been yet credentialed with [place] [facility].  Medical records have been reviewed since our last visit.  She has seen primary care twice since our last visit.  She has complained about incontinence and was told to follow-up with urology as medications at primary care have tried have not helped.  Her primary care physician also wrote that she was experiencing anxiety and that she had alprazolam as needed.  However, I do not see that under her medication list, nor do I see that under her PDMP review.  PREVIOUS MEDICATIONS: Primidone  ALLERGIES:   Allergies  Allergen Reactions  . Ace Inhibitors     Patient doesn't recall reaction.  jkl  . Benicar [Olmesartan]     It made her sick  . Aspirin Other (See Comments)    bruising  .  Citalopram Hydrobromide Diarrhea    CURRENT MEDICATIONS:  Outpatient Encounter Medications as of 11/15/2018  Medication Sig  . albuterol (PROVENTIL HFA;VENTOLIN HFA) 108 (90 Base) MCG/ACT inhaler Inhale 2 puffs into the lungs every 6 (six) hours as needed for wheezing or shortness of breath.  . carbidopa-levodopa (SINEMET IR) 25-100 MG tablet Take 1 tablet by mouth 3 (three) times daily.  . cetirizine (ZYRTEC) 10 MG tablet Take 1 tablet (10 mg total) by mouth daily.  . Cholecalciferol (VITAMIN D3) 1000 UNITS tablet Take 2 tablets by mouth once daily   . donepezil (ARICEPT) 5 MG tablet Take 1 tablet (5 mg total) by mouth at bedtime.  . gabapentin (NEURONTIN) 300 MG capsule TAKE 1 CAPSULE BY MOUTH THREE TIMES A  DAY  . IRON PO Take 1 tablet by mouth daily.  . montelukast (SINGULAIR) 10 MG tablet Take 1 tablet (10 mg total) by mouth daily.  . primidone (MYSOLINE) 50 MG tablet Take 1 tablet (50 mg total) by mouth 4 (four) times daily.  [name] venlafaxine XR (EFFEXOR-XR) 150 MG 24 hr capsule TAKE 1 CAPSULE BY MOUTH EVERY DAY WITH BREAKFAST  . verapamil (VERELAN PM) 120 MG 24 hr capsule TAKE 1 CAPSULE (120 MG TOTAL) BY MOUTH AT BEDTIME.   No facility-administered encounter medications on file as of 11/15/2018.     PAST MEDICAL HISTORY:   Past Medical History:  Diagnosis Date  . Anxiety   . Asthma   . Depression   . Diverticulosis of colon (without mention of hemorrhage)   . Esophageal stricture   . GERD (gastroesophageal reflux disease)   . Heart murmur   . Hypertension   . Internal hemorrhoids without mention of complication   . Osteoarthritis   . Shortness of breath dyspnea    with allergies    PAST SURGICAL HISTORY:   Past Surgical History:  Procedure Laterality Date  . ABDOMINAL HYSTERECTOMY    . BREAST CYST EXCISION Left 1980's  . LUMBAR LAMINECTOMY/DECOMPRESSION MICRODISCECTOMY Bilateral 02/20/2014   Procedure: LUMBAR TWO THREE, LUMBAR THREE FOUR LUMBAR LAMINECTOMY/DECOMPRESSION MICRODISCECTOMY 2 LEVELS;  Surgeon: Eustace Moore, MD;  Location: [facility];  Service: Neurosurgery;  Laterality: Bilateral;  . SPLENECTOMY    . TIBIA FRACTURE SURGERY Right   . TIBIA FRACTURE SURGERY Left   . TOTAL KNEE ARTHROPLASTY Right 2013    SOCIAL HISTORY:   Social History   Socioeconomic History  . Marital status: Married    Spouse name: Not on file  . Number of children: 2  . Years of education: 58  . Highest education level: Associate degree: occupational, Hotel manager, or vocational program  Occupational History  . Occupation: Retired    Comment: Network engineer, Youth worker   Social Needs  . Financial resource strain: Not on file  . Food insecurity    Worry: Not on file    Inability: Not on  file  . Transportation needs    Medical: Not on file    Non-medical: Not on file  Tobacco Use  . Smoking status: Never Smoker  . Smokeless tobacco: Never Used  Substance and Sexual Activity  . Alcohol use: No    Alcohol/week: 2.0 standard drinks    Types: 2 Glasses of wine per week    Comment: wine on occ   . Drug use: No  . Sexual activity: Not Currently  Lifestyle  . Physical activity    Days per week: Not on file    Minutes per session: Not on file  . Stress:  Not on file  Relationships  . Social Herbalist on phone: Not on file    Gets together: Not on file    Attends religious service: Not on file    Active member of club or organization: Not on file    Attends meetings of clubs or organizations: Not on file    Relationship status: Not on file  . Intimate partner violence    Fear of current or ex partner: Not on file    Emotionally abused: Not on file    Physically abused: Not on file    Forced sexual activity: Not on file  Other Topics Concern  . Not on file  Social History Narrative   Drink tea daily and coffee on occ    FAMILY HISTORY:   Family Status  Relation Name Status  . Mother  Deceased  . Father 104 Deceased  . Daughter  Alive  . Son  Deceased       cause unknown  . Brother  Alive  . Neg Hx  (Not Specified)    ROS:  Review of Systems  Constitutional: Negative.   HENT: Negative.   Eyes: Negative.   Cardiovascular: Negative.   Gastrointestinal: Negative.   Genitourinary: Negative.   Musculoskeletal: Positive for back pain.  Skin: Negative.     PHYSICAL EXAMINATION:    VITALS:   Vitals:   11/15/18 1512  Weight: 176 lb (79.8 kg)    GEN:  The patient appears stated age and is in NAD. HEENT:  Normocephalic, atraumatic.  The mucous membranes are moist. The superficial temporal arteries are without ropiness or tenderness. CV:  RRR Lungs:  CTAB Neck/HEME:  There are no carotid bruits bilaterally.    Neurological examination:   Orientation:  Montreal Cognitive Assessment  05/13/2018  Visuospatial/ Executive (0/5) 3  Naming (0/3) 2  Attention: Read list of digits (0/2) 2  Attention: Read list of letters (0/1) 0  Attention: Serial 7 subtraction starting at 100 (0/3) 3  Language: Repeat phrase (0/2) 2  Language : Fluency (0/1) 0  Abstraction (0/2) 2  Delayed Recall (0/5) 1  Orientation (0/6) 6  Total 21   Cranial nerves: There is good facial symmetry.  Extraocular muscles are intact. The visual fields are full to confrontational testing. The speech is fluent and clear. Soft palate rises symmetrically and there is no tongue deviation. Hearing is intact to conversational tone. Sensation: Sensation is intact to light and pinprick throughout (facial, trunk, extremities). Vibration is intact at the bilateral big toe. There is no extinction with double simultaneous stimulation. There is no sensory dermatomal level identified. Motor: Strength is 5/5 in the bilateral upper and lower extremities (with encouragement - has pain in the LE with MMT).   Shoulder shrug is equal and symmetric.  There is no pronator drift.  Last took carbidopa/levodopa 25/100 at 2pm.  She was seen at 3:30.  Movement examination: Tone: There is normal tone in the bilateral upper extremities.  The tone in the lower extremities is normal.  Abnormal movements:  there is LUE and RLE resting tremor Coordination:  There is decremation with RAM, with any form of RAMS, including alternating supination and pronation of the forearm, hand opening and closing, finger taps, heel taps and toe taps, L more than R (same as prior) Gait and Station: Her husband assists her out of the chair.  She is stooped at the waist.  She uses the rollator.  Labs:   Chemistry  Component Value Date/Time   NA 142 02/28/2018 1544   K 4.2 02/28/2018 1544   CL 104 02/28/2018 1544   CO2 31 02/28/2018 1544   BUN 21 02/28/2018 1544   CREATININE 0.88 02/28/2018 1544       Component Value Date/Time   CALCIUM 8.7 (L) 02/28/2018 1544   ALKPHOS 49 02/28/2018 1544   AST 13 (L) 02/28/2018 1544   ALT 13 02/28/2018 1544   BILITOT 0.6 02/28/2018 1544     Lab Results  Component Value Date   TSH 0.82 02/12/2017     ASSESSMENT/PLAN:  1.  Tremor predominant Parkinson's disease  -Long discussion with patient and her husband.  This was really much of the same talk visit that we have last visit.  Talked to her about compliance with medication and taking this on a regular schedule.  Talked to her about waking up at a fairly normal hour (currently waking up at 2 PM).  We ultimately settled on getting out of the bed by 10 AM.  Talked about day night reversal and this increasing confusion.  -Increase carbidopa/levodopa 25/100, 2 tablets at 10 AM, 2 tablets at 2 PM, 1 tablet at 6 PM.  -Needs referral to PT.  Have sent referral before but doesn't look like she was contacted.  -We discussed community resources in the area including patient support groups and community exercise programs for PD and pt education was provided to the patient.  -Met with my Education officer, museum today.    2.  Low back pain  -Discussed with her that her low back pain is independent of parkinsonism.  This really is her biggest complaint.  She is seeing [facility] and is considering surgery.    3.  REM behavior disorder  -This is commonly associated with PD and the patient is experiencing this.  We discussed that this can be very serious and even harmful.  We talked about medications as well as physical barriers to put in the bed (particularly soft bed rails, pillow barriers).  We talked about moving the night stand so that it is not so close to the side of the bed.  I asked them to go ahead and order the bed rails as she has fallen out of the bed in the past.  I really do not want to add more medication right now.  4.  Memory loss  -She is on Aricept by primary care.    -Awaiting for [name] is  credentialing to be completed with [place] [facility], so that she can complete neurocognitive testing.  5.  Much greater than 50% of this visit was spent in counseling and coordinating care.  Total face to face time:  30 min.  Follow up is anticipated in the next 4-6 months, sooner should new neurologic issues arise.   Cc:  Plotnikov, Evie Lacks, MD

## 2018-11-15 ENCOUNTER — Ambulatory Visit (INDEPENDENT_AMBULATORY_CARE_PROVIDER_SITE_OTHER): Payer: Medicare Other | Admitting: Neurology

## 2018-11-15 ENCOUNTER — Encounter: Payer: Self-pay | Admitting: Neurology

## 2018-11-15 ENCOUNTER — Other Ambulatory Visit: Payer: Self-pay

## 2018-11-15 VITALS — BP 119/74 | HR 88 | Ht 61.0 in | Wt 176.0 lb

## 2018-11-15 DIAGNOSIS — G20 Parkinson's disease: Secondary | ICD-10-CM | POA: Diagnosis not present

## 2018-11-15 DIAGNOSIS — R413 Other amnesia: Secondary | ICD-10-CM

## 2018-11-15 MED ORDER — CARBIDOPA-LEVODOPA 25-100 MG PO TABS: ORAL_TABLET | ORAL | 1 refills | Status: DC

## 2018-11-15 NOTE — Patient Instructions (Addendum)
Increase carbidopa/levodopa 25/100, to 2 tablets at 10am/2 tablets at 2 pm/1 tablet at 6pm  You have been referred to Neuro Rehab for therapy. They will call you directly to schedule an appointment.  Please call [phone] if you do not hear from them.   The physicians and staff at [facility] are committed to providing excellent care. You may receive a survey requesting feedback about your experience at our office. We strive to receive "very good" responses to the survey questions. If you feel that your experience would prevent you from giving the office a "very good " response, please contact our office to try to remedy the situation. We may be reached at [phone]. Thank you for taking the time out of your busy day to complete the survey.

## 2018-11-21 ENCOUNTER — Telehealth: Payer: Self-pay | Admitting: Neurology

## 2018-11-21 NOTE — Telephone Encounter (Signed)
Pt return call she states that medication carbidopa-levodopa was increase last week She states that the increase dose 25/100 2 at 10pm 2 at 2pm 1 at 6pm 2 at 10 pm has been making her sick. Stomach upset, nausea, excessive sweating.

## 2018-11-21 NOTE — Telephone Encounter (Signed)
Called patient no answer left message to call office back to discuss medication.

## 2018-11-21 NOTE — Telephone Encounter (Signed)
Is she taking it with a carbohydrate?  If so and still nauseated stop carbidopa/levodopa 25/100 IR and change to carbidopa/levodopa 25/100 CR at the same time, same number of pills, same directions

## 2018-11-21 NOTE — Telephone Encounter (Signed)
Patient left msg with after hours that she was seen last Tuesday; PD meds were changed. She is not sleeping well with the new change. Stays up late and then sleeps late; normally daybreak when goes to sleep and meds seem to bother her stomach. Thanks!

## 2018-11-22 NOTE — Telephone Encounter (Signed)
Called patient no answer left message to call office back to discuss message below.

## 2018-11-23 MED ORDER — CARBIDOPA-LEVODOPA ER 25-100 MG PO TBCR: EXTENDED_RELEASE_TABLET | ORAL | 1 refills | Status: DC

## 2018-11-23 NOTE — Telephone Encounter (Signed)
Patient left msg with after hours that she was returning your call. Thanks!  °

## 2018-11-23 NOTE — Telephone Encounter (Signed)
Yes patient is taken with carbohydrates, I informed patient on provider response with changing medication.  Will send to [facility] per patient request

## 2018-12-05 DIAGNOSIS — G8929 Other chronic pain: Secondary | ICD-10-CM | POA: Diagnosis not present

## 2018-12-05 DIAGNOSIS — M4180 Other forms of scoliosis, site unspecified: Secondary | ICD-10-CM | POA: Diagnosis not present

## 2018-12-05 DIAGNOSIS — M545 Low back pain: Secondary | ICD-10-CM | POA: Diagnosis not present

## 2018-12-05 DIAGNOSIS — M47816 Spondylosis without myelopathy or radiculopathy, lumbar region: Secondary | ICD-10-CM | POA: Diagnosis not present

## 2018-12-07 ENCOUNTER — Ambulatory Visit: Payer: Medicare Other | Admitting: Internal Medicine

## 2018-12-07 ENCOUNTER — Telehealth: Payer: Self-pay | Admitting: Neurology

## 2018-12-07 DIAGNOSIS — R413 Other amnesia: Secondary | ICD-10-CM

## 2018-12-07 NOTE — Telephone Encounter (Signed)
Patient called regarding her not sleeping at night and she is taking Carbidopa Levodopa and Primidone medications. She has a Materials engineer today with her PCP regarding not sleeping and sweating a lot. She said she will cancel her PCP appt that she has today. She also said that she is having a hard time getting all her medication in because she wakes up late from not sleeping. She uses [facility] on [address]/ [address]. Please Call [phone]. Thank you

## 2018-12-07 NOTE — Telephone Encounter (Signed)
She should not cancel the appt with her PCP. She should discuss with her PCP.  She can try melatonin, 3 mg at night for the sleep.  Also, check on her scheduling with [name].  I don't see that she has been scheduled.

## 2018-12-07 NOTE — Telephone Encounter (Signed)
Spoke with patient she was informed of provider response. referral place for [name] transferred to front desk to follow up on appt/ insurance credentials

## 2018-12-08 ENCOUNTER — Encounter: Payer: Self-pay | Admitting: Internal Medicine

## 2018-12-08 ENCOUNTER — Other Ambulatory Visit: Payer: Self-pay

## 2018-12-08 ENCOUNTER — Ambulatory Visit (INDEPENDENT_AMBULATORY_CARE_PROVIDER_SITE_OTHER): Payer: Medicare Other | Admitting: Internal Medicine

## 2018-12-08 ENCOUNTER — Inpatient Hospital Stay: Payer: Medicare Other | Attending: Physician Assistant

## 2018-12-08 DIAGNOSIS — F19982 Other psychoactive substance use, unspecified with psychoactive substance-induced sleep disorder: Secondary | ICD-10-CM | POA: Diagnosis not present

## 2018-12-08 DIAGNOSIS — D649 Anemia, unspecified: Secondary | ICD-10-CM

## 2018-12-08 DIAGNOSIS — G4700 Insomnia, unspecified: Secondary | ICD-10-CM | POA: Insufficient documentation

## 2018-12-08 DIAGNOSIS — D508 Other iron deficiency anemias: Secondary | ICD-10-CM | POA: Diagnosis not present

## 2018-12-08 DIAGNOSIS — R61 Generalized hyperhidrosis: Secondary | ICD-10-CM

## 2018-12-08 DIAGNOSIS — R413 Other amnesia: Secondary | ICD-10-CM | POA: Diagnosis not present

## 2018-12-08 HISTORY — DX: Insomnia, unspecified: G47.00

## 2018-12-08 LAB — CBC WITH DIFFERENTIAL (CANCER CENTER ONLY)
Abs Immature Granulocytes: 0.07 10*3/uL (ref 0.00–0.07)
Basophils Absolute: 0.1 10*3/uL (ref 0.0–0.1)
Basophils Relative: 1 %
Eosinophils Absolute: 0.1 10*3/uL (ref 0.0–0.5)
Eosinophils Relative: 1 %
HCT: 43.5 % (ref 36.0–46.0)
Hemoglobin: 14 g/dL (ref 12.0–15.0)
Immature Granulocytes: 1 %
Lymphocytes Relative: 39 %
Lymphs Abs: 4.3 10*3/uL — ABNORMAL HIGH (ref 0.7–4.0)
MCH: 29.8 pg (ref 26.0–34.0)
MCHC: 32.2 g/dL (ref 30.0–36.0)
MCV: 92.6 fL (ref 80.0–100.0)
Monocytes Absolute: 1.2 10*3/uL — ABNORMAL HIGH (ref 0.1–1.0)
Monocytes Relative: 11 %
Neutro Abs: 5.4 10*3/uL (ref 1.7–7.7)
Neutrophils Relative %: 47 %
Platelet Count: 389 10*3/uL (ref 150–400)
RBC: 4.7 MIL/uL (ref 3.87–5.11)
RDW: 15.6 % — ABNORMAL HIGH (ref 11.5–15.5)
WBC Count: 11.1 10*3/uL — ABNORMAL HIGH (ref 4.0–10.5)
nRBC: 0 % (ref 0.0–0.2)

## 2018-12-08 LAB — RETICULOCYTES
Immature Retic Fract: 19.3 % — ABNORMAL HIGH (ref 2.3–15.9)
RBC.: 4.69 MIL/uL (ref 3.87–5.11)
Retic Count, Absolute: 86.8 10*3/uL (ref 19.0–186.0)
Retic Ct Pct: 1.9 % (ref 0.4–3.1)

## 2018-12-08 MED ORDER — TEMAZEPAM 15 MG PO CAPS: 15.0000 mg | ORAL_CAPSULE | Freq: Every evening | ORAL | 3 refills | Status: DC | PRN

## 2018-12-08 NOTE — Assessment & Plan Note (Signed)
Carbid/Lev dose was increased - c/o hard time falling asleep (6 am) getting up at 12 am, poor sleep. Try Temazepam 15-30 mg at 11-1:30 pm

## 2018-12-08 NOTE — Assessment & Plan Note (Signed)
F/u w/dr Tat to discuss meds, sweats etc

## 2018-12-08 NOTE — Progress Notes (Signed)
Subjective:  Patient ID: Mallory Klein, female    DOB: 09/03/1945  Age: 73 y.o. MRN: [id number]  CC: No chief complaint on file.   HPI Mallory Klein presents for insomnia Carbid/Lev dose was increased - c/o hard time falling asleep (6 am) getting up at 12 am, poor sleep C/o sweating at night too   Outpatient Medications Prior to Visit  Medication Sig Dispense Refill   albuterol (PROVENTIL HFA;VENTOLIN HFA) 108 (90 Base) MCG/ACT inhaler Inhale 2 puffs into the lungs every 6 (six) hours as needed for wheezing or shortness of breath. 1 Inhaler 6   Carbidopa-Levodopa ER (SINEMET CR) 25-100 MG tablet controlled release Take 2 tablets at 10AM, 2 at 1PM, 1 at 6PM, 2 at 10PM 450 tablet 1   cetirizine (ZYRTEC) 10 MG tablet Take 1 tablet (10 mg total) by mouth daily. 90 tablet 3   Cholecalciferol (VITAMIN D3) 1000 UNITS tablet Take 2 tablets by mouth once daily      donepezil (ARICEPT) 5 MG tablet Take 1 tablet (5 mg total) by mouth at bedtime. 90 tablet 3   gabapentin (NEURONTIN) 300 MG capsule TAKE 1 CAPSULE BY MOUTH THREE TIMES A DAY 270 capsule 3   IRON PO Take 1 tablet by mouth daily.     montelukast (SINGULAIR) 10 MG tablet Take 1 tablet (10 mg total) by mouth daily. 90 tablet 3   primidone (MYSOLINE) 50 MG tablet Take 1 tablet (50 mg total) by mouth 4 (four) times daily. 90 tablet 5   venlafaxine XR (EFFEXOR-XR) 150 MG 24 hr capsule TAKE 1 CAPSULE BY MOUTH EVERY DAY WITH BREAKFAST 90 capsule 3   verapamil (VERELAN PM) 120 MG 24 hr capsule TAKE 1 CAPSULE (120 MG TOTAL) BY MOUTH AT BEDTIME. 90 capsule 3   No facility-administered medications prior to visit.     ROS: Review of Systems  Constitutional: Positive for diaphoresis and fatigue. Negative for activity change, appetite change, chills and unexpected weight change.  HENT: Negative for congestion, mouth sores and sinus pressure.   Eyes: Negative for visual disturbance.  Respiratory: Negative for cough and chest  tightness.   Gastrointestinal: Negative for abdominal pain and nausea.  Genitourinary: Negative for difficulty urinating, frequency and vaginal pain.  Musculoskeletal: Negative for back pain and gait problem.  Skin: Negative for pallor and rash.  Neurological: Positive for tremors. Negative for dizziness, weakness, numbness and headaches.  Psychiatric/Behavioral: Positive for decreased concentration and sleep disturbance. Negative for confusion. The patient is nervous/anxious.     Objective:  BP 114/78 (BP Location: Left Arm, Patient Position: Sitting, Cuff Size: Large)    Pulse 77    Temp 98.4 F (36.9 C) (Oral)    Ht 5\' 1"  (1.549 m)    Wt 176 lb (79.8 kg)    SpO2 95%    BMI 33.25 kg/m   BP Readings from Last 3 Encounters:  12/08/18 114/78  11/15/18 119/74  10/19/18 (!) 150/88    Wt Readings from Last 3 Encounters:  12/08/18 176 lb (79.8 kg)  11/15/18 176 lb (79.8 kg)  10/19/18 172 lb (78 kg)    Physical Exam Constitutional:      General: She is not in acute distress.    Appearance: She is well-developed.  HENT:     Head: Normocephalic.     Right Ear: External ear normal.     Left Ear: External ear normal.     Nose: Nose normal.  Eyes:     General:  Right eye: No discharge.        Left eye: No discharge.     Conjunctiva/sclera: Conjunctivae normal.     Pupils: Pupils are equal, round, and reactive to light.  Neck:     Musculoskeletal: Normal range of motion and neck supple.     Thyroid: No thyromegaly.     Vascular: No JVD.     Trachea: No tracheal deviation.  Cardiovascular:     Rate and Rhythm: Normal rate and regular rhythm.     Heart sounds: Normal heart sounds.  Pulmonary:     Effort: No respiratory distress.     Breath sounds: No stridor. No wheezing.  Abdominal:     General: Bowel sounds are normal. There is no distension.     Palpations: Abdomen is soft. There is no mass.     Tenderness: There is no abdominal tenderness. There is no guarding or  rebound.  Musculoskeletal:        General: Tenderness present.  Lymphadenopathy:     Cervical: No cervical adenopathy.  Skin:    Findings: No erythema or rash.  Neurological:     Mental Status: She is oriented to person, place, and time.     Cranial Nerves: No cranial nerve deficit.     Motor: No abnormal muscle tone.     Coordination: Coordination abnormal.     Deep Tendon Reflexes: Reflexes normal.  Psychiatric:        Behavior: Behavior normal.        Thought Content: Thought content normal.        Judgment: Judgment normal.     Lab Results  Component Value Date   WBC 11.1 (H) 12/08/2018   HGB 14.0 12/08/2018   HCT 43.5 12/08/2018   PLT 389 12/08/2018   GLUCOSE 101 (H) 02/28/2018   CHOL 219 (H) 02/05/2015   TRIG 69.0 02/05/2015   HDL 73.70 02/05/2015   LDLDIRECT 109.8 02/10/2010   LDLCALC 132 (H) 02/05/2015   ALT 13 02/28/2018   AST 13 (L) 02/28/2018   NA 142 02/28/2018   K 4.2 02/28/2018   CL 104 02/28/2018   CREATININE 0.88 02/28/2018   BUN 21 02/28/2018   CO2 31 02/28/2018   TSH 0.82 02/12/2017   INR 0.97 02/07/2014   HGBA1C 6.2 02/12/2017    Ct Abdomen Pelvis W Contrast  Result Date: 02/28/2018 CLINICAL DATA:  Abdominal pain EXAM: CT ABDOMEN AND PELVIS WITH CONTRAST TECHNIQUE: Multidetector CT imaging of the abdomen and pelvis was performed using the standard protocol following bolus administration of intravenous contrast. CONTRAST:  151mL ISOVUE-300 IOPAMIDOL (ISOVUE-300) INJECTION 61% COMPARISON:  None. FINDINGS: LOWER CHEST: There is no basilar pleural or apical pericardial effusion. HEPATOBILIARY: The hepatic contours and density are normal. There is no intra- or extrahepatic biliary dilatation. The gallbladder is normal. PANCREAS: The pancreatic parenchymal contours are normal and there is no ductal dilatation. There is no peripancreatic fluid collection. SPLEEN: Normal. ADRENALS/URINARY TRACT: --Adrenal glands: Normal. --Right kidney/ureter: No  hydronephrosis, nephroureterolithiasis, perinephric stranding or solid renal mass. --Left kidney/ureter: No hydronephrosis, nephroureterolithiasis, perinephric stranding or solid renal mass. --Urinary bladder: Normal for degree of distention STOMACH/BOWEL: --Stomach/Duodenum: There is no hiatal hernia or other gastric abnormality. The duodenal course and caliber are normal. --Small bowel: No dilatation or inflammation. --Colon: No focal abnormality. --Appendix: Normal. VASCULAR/LYMPHATIC: There is aortic atherosclerosis without hemodynamically significant stenosis. The portal vein, splenic vein, superior mesenteric vein and IVC are patent. No abdominal or pelvic lymphadenopathy. REPRODUCTIVE: Status post hysterectomy. No  adnexal mass. MUSCULOSKELETAL. Multilevel degenerative disc disease and facet arthrosis. No bony spinal canal stenosis. OTHER: None. IMPRESSION: No acute abdominal or pelvic abnormality. Electronically Signed   By: Ulyses Jarred M.D.   On: 02/28/2018 18:50    Assessment & Plan:   There are no diagnoses linked to this encounter.   No orders of the defined types were placed in this encounter.    Follow-up: No follow-ups on file.  Walker Kehr, MD

## 2018-12-08 NOTE — Assessment & Plan Note (Signed)
Aricept.

## 2018-12-09 ENCOUNTER — Telehealth: Payer: Self-pay

## 2018-12-09 LAB — IRON AND TIBC
Iron: 161 ug/dL — ABNORMAL HIGH (ref 41–142)
Saturation Ratios: 56 % (ref 21–57)
TIBC: 286 ug/dL (ref 236–444)
UIBC: 125 ug/dL (ref 120–384)

## 2018-12-09 LAB — FERRITIN: Ferritin: 35 ng/mL (ref 11–307)

## 2018-12-09 NOTE — Telephone Encounter (Signed)
-----   Message from [place], DO sent at 12/08/2018  4:11 PM EDT ----- Mallory Klein can make a vv if she wants but I just saw her 3 weeks ago and changed her meds and Dr. Alain Marion changed her sleep med yesterday.  May need to give these things a little time. ----- Message ----- From: Ranae Plumber, CMA Sent: 12/08/2018   4:07 PM EDT To: Ludwig Clarks, DO  PCP requesting to move patient appt up. Please advise ----- Message ----- From: Cassandria Anger, MD Sent: 12/08/2018   3:57 PM EDT To: Ranae Plumber, CMA  Mallory Klein, Can we move up Mallory Klein's appt with Dr [name], please? - Mallory Klein seems to have issues with her meds. Thanks, AP

## 2018-12-09 NOTE — Telephone Encounter (Signed)
Called spoke with patient she declines VV right now. She states that she tried out sleep medication and it has help her sleep. She will try out medication for a few more days and if she needs to move up appt she will call to request VV with [name]

## 2018-12-10 ENCOUNTER — Encounter: Payer: Self-pay | Admitting: Internal Medicine

## 2018-12-13 ENCOUNTER — Telehealth: Payer: Self-pay

## 2018-12-13 NOTE — Telephone Encounter (Signed)
-----   Message from Alla Feeling, NP sent at 12/11/2018  7:45 PM EDT ----- Please let her know labs. WBC is slightly elevated, no anemia or thrombocytosis. Iron studies also normal. No concerns.  Thanks, Regan Rakers

## 2018-12-13 NOTE — Telephone Encounter (Signed)
Message relayed to Pt. about lab work Pt. verbalized understanding. No further problems or concerns noted.

## 2018-12-19 DIAGNOSIS — H18592 Other hereditary corneal dystrophies, left eye: Secondary | ICD-10-CM | POA: Diagnosis not present

## 2018-12-19 DIAGNOSIS — H0259 Other disorders affecting eyelid function: Secondary | ICD-10-CM | POA: Diagnosis not present

## 2018-12-19 DIAGNOSIS — Z961 Presence of intraocular lens: Secondary | ICD-10-CM | POA: Diagnosis not present

## 2018-12-19 DIAGNOSIS — H524 Presbyopia: Secondary | ICD-10-CM | POA: Diagnosis not present

## 2018-12-19 DIAGNOSIS — H5213 Myopia, bilateral: Secondary | ICD-10-CM | POA: Diagnosis not present

## 2018-12-26 ENCOUNTER — Ambulatory Visit: Payer: Medicare Other | Attending: Neurology | Admitting: Physical Therapy

## 2018-12-26 ENCOUNTER — Encounter: Payer: Self-pay | Admitting: Physical Therapy

## 2018-12-26 ENCOUNTER — Other Ambulatory Visit: Payer: Self-pay

## 2018-12-26 DIAGNOSIS — M6281 Muscle weakness (generalized): Secondary | ICD-10-CM | POA: Diagnosis not present

## 2018-12-26 DIAGNOSIS — R2681 Unsteadiness on feet: Secondary | ICD-10-CM | POA: Diagnosis not present

## 2018-12-26 DIAGNOSIS — R293 Abnormal posture: Secondary | ICD-10-CM | POA: Insufficient documentation

## 2018-12-26 DIAGNOSIS — R2689 Other abnormalities of gait and mobility: Secondary | ICD-10-CM | POA: Diagnosis not present

## 2018-12-26 NOTE — Patient Instructions (Signed)
Access Code: [email or web address]  URL: [email or web address]  Date: 12/26/2018  Prepared by: Mady Haagensen   Exercises Seated March - 10 reps - 1-2 sets - 1x daily - 3x weekly Seated Long Arc Quad - 10 reps - 1-2 sets - 1x daily - 3x weekly Seated Ankle Pumps - 10 reps - 1-2 sets - 1x daily - 5x weekly

## 2018-12-26 NOTE — Therapy (Signed)
[facility] [address] [place], [place], [address] Phone: [phone]   Fax:  [phone]  Physical Therapy Evaluation  Patient Details  Name: Mallory Klein MRN: [id number] Date of Birth: Aug 16, 1945 Referring Provider (PT): Tat, Wells Guiles   Encounter Date: 12/26/2018  PT End of Session - 12/26/18 1830    Visit Number  1    Number of Visits  9    Date for PT Re-Evaluation  03/26/19    Authorization Type  UHC Medicare    PT Start Time  1319    PT Stop Time  1402    PT Time Calculation (min)  43 min    Activity Tolerance  Patient tolerated treatment well    Behavior During Therapy  [facility] for tasks assessed/performed       Past Medical History:  Diagnosis Date  . Anxiety   . Asthma   . Depression   . Diverticulosis of colon (without mention of hemorrhage)   . Esophageal stricture   . GERD (gastroesophageal reflux disease)   . Heart murmur   . Hypertension   . Internal hemorrhoids without mention of complication   . Osteoarthritis   . Shortness of breath dyspnea    with allergies    Past Surgical History:  Procedure Laterality Date  . ABDOMINAL HYSTERECTOMY    . BREAST CYST EXCISION Left 1980's  . LUMBAR LAMINECTOMY/DECOMPRESSION MICRODISCECTOMY Bilateral 02/20/2014   Procedure: LUMBAR TWO THREE, LUMBAR THREE FOUR LUMBAR LAMINECTOMY/DECOMPRESSION MICRODISCECTOMY 2 LEVELS;  Surgeon: Eustace Moore, MD;  Location: [facility];  Service: Neurosurgery;  Laterality: Bilateral;  . SPLENECTOMY    . TIBIA FRACTURE SURGERY Right   . TIBIA FRACTURE SURGERY Left   . TOTAL KNEE ARTHROPLASTY Right 2013    There were no vitals filed for this visit.   Subjective Assessment - 12/26/18 1324    Subjective  Pt's husband reports that patient has some balance issues and is sedentary.  He feels therapy may be helpful to get her stronger.   Pt uses rollator when she is out in community; in the house, she uses cane.   She  has had 2 falls in past 6 months.  One fall occurred while getting off the bed (low bed) and she slipped into the dresser.  Another fall happened while navigating narrow space in bedroom.  Husband has to help her get off the floor.    Pertinent History  PMH includes back surgery, back pain    Patient Stated Goals  Pt reports her goals are to stop shaking and to get on a better schedule.  Husband wants her to get stronger and have better.    Currently in Pain?  Yes    Pain Score  4    At worst:  10/10   Pain Location  Back    Pain Orientation  Right;Left;Lower    Pain Descriptors / Indicators  Aching;Dull    Pain Type  Chronic pain    Pain Onset  More than a month ago    Pain Frequency  Constant    Pain Relieving Factors  heat helps, series of shots from [facility] has helped in the past    Effect of Pain on Daily Activities  PT will monitor pain, but not address as a goal at this time as pain is chronic in nature, being managed by MD at [facility] (per patient report)         [facility] PT Assessment - 12/26/18 1334  Assessment   Medical Diagnosis  Parkinson's disease    Referring Provider (PT)  Tat, Wells Guiles    Onset Date/Surgical Date  11/15/18    Hand Dominance  Right      Precautions   Precautions  Fall      Balance Screen   Has the patient fallen in the past 6 months  Yes    How many times?  2    Has the patient had a decrease in activity level because of a fear of falling?   No    Is the patient reluctant to leave their home because of a fear of falling?   No      Home Social worker  Private residence    Living Arrangements  Spouse/significant other    Available Help at Discharge  Family    Type of [place]  --   1 step   [place]  One level    [place] - single point;Walker - 4 wheels;Tub bench;Grab bars - tub/shower      Prior Function   Level of Independence  Independent   Husband helps at times   Vocation  Retired     Leisure  Husband performs cooking tasks; enjoyed going out with friends      Observation/Other Assessments   Focus on Therapeutic Outcomes (FOTO)   NA      Posture/Postural Control   Posture/Postural Control  Postural limitations    Postural Limitations  Rounded Shoulders;Forward head;Flexed trunk      ROM / Strength   AROM / PROM / Strength  Strength      Strength   Overall Strength Comments  Grossly tested at least 4/5 throughout BLES      Transfers   Transfers  Sit to Stand;Stand to Sit    Sit to Stand  5: Supervision;With upper extremity assist;From chair/3-in-1    Five time sit to stand comments   33.81   for 3x sit<>stand   Stand to Sit  5: Supervision;With upper extremity assist;To chair/3-in-1      Ambulation/Gait   Ambulation/Gait  Yes    Ambulation/Gait Assistance  5: Supervision    Ambulation Distance (Feet)  125 Feet    Assistive device  Rollator    Gait Pattern  Step-through pattern;Decreased step length - right;Decreased step length - left;Trunk flexed;Narrow base of support;Poor foot clearance - left;Poor foot clearance - right   valgus BLE   Ambulation Surface  Level;Indoor    Gait velocity  26.16 sec = 1.25 ft/sec    Gait Comments  Gait with SPC x 20 ft with min guard assistance, decreased foot clearance, narrow BOS      Standardized Balance Assessment   Standardized Balance Assessment  Timed Up and Go Test      Timed Up and Go Test   Normal TUG (seconds)  33.65   rollator   Cognitive TUG (seconds)  42.03   rollator     High Level Balance   High Level Balance Comments  Single limb stance:  Pt unable to perform without UE support                Objective measurements completed on examination: See above findings.              PT Education - 12/26/18 1848    Education Details  Initiation of HEP    Person(s) Educated  Patient;Spouse    Methods  Explanation;Demonstration;Handout    Comprehension  Verbalized understanding;Returned  demonstration       PT Short Term Goals - 12/26/18 1839      PT SHORT TERM GOAL #1   Title  Pt will perform HEP with family supervision, for improved balance, transfers, and gait.  TARGET 01/27/2019    Time  4    Period  Weeks    Status  New    Target Date  01/27/19      PT SHORT TERM GOAL #2   Title  Pt will improve TUG score to less than or equal to 28 seconds for decreased fall risk, improved ADL participation in the home.    Time  4    Period  Weeks    Status  New    Target Date  01/27/19      PT SHORT TERM GOAL #3   Title  Pt will perform 3 reps of sit<>stand with UE support in less than or equal to 28 seconds for improved transfer efficiency and safety.    Time  4    Period  Weeks    Status  New    Target Date  01/27/19      PT SHORT TERM GOAL #4   Title  Pt/husband will verbalize understanding of fall prevention in home environment.    Time  4    Period  Weeks    Status  New    Target Date  01/27/19        PT Long Term Goals - 12/26/18 1842      PT LONG TERM GOAL #1   Title  Pt will perform progression of HEP for improved balance, strength, and gait.  TARGET 02/23/2019    Time  8    Period  Weeks    Status  New    Target Date  02/23/19      PT LONG TERM GOAL #2   Title  Pt will improve TUG score to less than or equal to 23 seconds for decreased fall risk.    Time  8    Period  Weeks    Status  New    Target Date  02/23/19      PT LONG TERM GOAL #3   Title  Pt will improve gait velocity to at least 1.8 ft/sec for improved gait efficiency and safety.    Time  8    Period  Weeks    Target Date  02/23/19             Plan - 12/26/18 1831    Clinical Impression Statement  Pt is a 73 year old female who presents to [facility] with diagnosis of Parkinson's disease, with 2 falls in the past 6 months.  She presents with bilateral UE tremors, abnormal posture, bradykinesia, decreased functional strength, decreased balance, decreased gait independence.  She is  at fall risk per TUG and gait velocity scores.  She will benefit from skilled PT to address the above stated deficits for overall improved functional mobility and decreased fall risk.    Personal Factors and Comorbidities  Comorbidity 3+    Comorbidities  PMH includes anxiety, asthma, depression, GERD, HTN, OA, back pain, lumbar laminectomy 2015, R tKR    Examination-Activity Limitations  Stand;Locomotion Level;Transfers    Examination-Participation Restrictions  Community Activity    Stability/Clinical Decision Making  Evolving/Moderate complexity    Clinical Decision Making  Moderate    Rehab Potential  Good    PT Frequency  1x / week   PT recommends 2x/wk, but pt prefers 1x/wk due to financial (copay) concerns   PT Duration  8 weeks   plus eval   PT Treatment/Interventions  ADLs/Self Care Home Management;Gait training;Functional mobility training;Therapeutic activities;Therapeutic exercise;Balance training;DME Instruction;Neuromuscular re-education;Patient/family education    PT Next Visit Plan  Briefly review HEP sent via [place]; add additional strengthening and balance exercises, including sit<>stand; walking program for HEP    Consulted and Agree with Plan of Care  Patient;Family member/caregiver    Family Member Consulted  Husband       Patient will benefit from skilled therapeutic intervention in order to improve the following deficits and impairments:  Abnormal gait, Difficulty walking, Decreased balance, Decreased mobility, Decreased strength, Postural dysfunction  Visit Diagnosis: Other abnormalities of gait and mobility  Unsteadiness on feet  Muscle weakness (generalized)  Abnormal posture     Problem List Patient Active Problem List   Diagnosis Date Noted  . Sweating profusely 12/08/2018  . Insomnia disorder 12/08/2018  . Cerumen impaction 10/19/2018  . Thrombocytosis ([place]) 05/24/2017  . Iron deficiency anemia 04/23/2017  . Cough 05/12/2016  . Memory problem  09/03/2015  . Abnormal CBC 02/05/2015  . Edema 10/15/2014  . Incontinence in female 10/15/2014  . S/P lumbar laminectomy 02/20/2014  . Pre-ulcerative calluses 02/09/2014  . Parkinson disease ([place]) 02/09/2014  . Cold sore 10/17/2013  . Low back pain 07/25/2013  . Heel ulcer ([place]) 03/08/2012  . Rash 12/04/2011  . Neck pain on left side 08/06/2011  . Grief at loss of child 08/06/2011  . Well adult exam 03/02/2011  . Hyperglycemia 03/02/2011  . Knee pain, right 09/08/2010  . OSA (obstructive sleep apnea) 02/14/2010  . SWEATING 02/14/2010  . BRONCHITIS, ACUTE 04/05/2009  . VERTIGO 12/02/2007  . ABNORMAL GLUCOSE NEC 12/02/2007  . OTITIS MEDIA 10/26/2007  . HEADACHE 10/26/2007  . OTHER MALAISE AND FATIGUE 05/11/2007  . Anxiety state 02/15/2007  . Adjustment disorder with mixed anxiety and depressed mood 02/15/2007  . GERD 02/15/2007  . Osteoarthritis 02/15/2007  . POSTHERPETIC TRIGEMINAL NEURALGIA 01/26/2007  . HERPES ZOSTER 01/26/2007  . OBESITY, MORBID 01/26/2007  . MIGRAINE HEADACHE 01/26/2007  . Essential hypertension 01/26/2007  . Asthma 01/26/2007  . Urinary incontinence 01/26/2007    , W. 12/26/2018, 6:49 PM  Frazier Butt., PT   [facility] [address] [place], [place], [address] Phone: [phone]   Fax:  [phone]  Name: LARIE PALAS MRN: [id number] Date of Birth: 1945/08/08

## 2018-12-30 DIAGNOSIS — M545 Low back pain: Secondary | ICD-10-CM | POA: Diagnosis not present

## 2018-12-30 DIAGNOSIS — M4180 Other forms of scoliosis, site unspecified: Secondary | ICD-10-CM | POA: Diagnosis not present

## 2018-12-30 DIAGNOSIS — M5136 Other intervertebral disc degeneration, lumbar region: Secondary | ICD-10-CM | POA: Diagnosis not present

## 2018-12-30 DIAGNOSIS — M47816 Spondylosis without myelopathy or radiculopathy, lumbar region: Secondary | ICD-10-CM | POA: Diagnosis not present

## 2019-01-02 ENCOUNTER — Telehealth: Payer: Self-pay | Admitting: Internal Medicine

## 2019-01-02 NOTE — Telephone Encounter (Signed)
RX REFILL cetirizine (ZYRTEC) 10 MG tablet primidone (MYSOLINE) 50 MG tablet  PHARMACY [facility] [place], [place] [phone] (Phone) [phone] (Fax)

## 2019-01-03 ENCOUNTER — Encounter: Payer: Self-pay | Admitting: Physical Therapy

## 2019-01-03 ENCOUNTER — Telehealth: Payer: Self-pay

## 2019-01-03 ENCOUNTER — Other Ambulatory Visit: Payer: Self-pay

## 2019-01-03 ENCOUNTER — Ambulatory Visit: Payer: Medicare Other | Admitting: Physical Therapy

## 2019-01-03 DIAGNOSIS — M6281 Muscle weakness (generalized): Secondary | ICD-10-CM | POA: Diagnosis not present

## 2019-01-03 DIAGNOSIS — R2689 Other abnormalities of gait and mobility: Secondary | ICD-10-CM | POA: Diagnosis not present

## 2019-01-03 DIAGNOSIS — R2681 Unsteadiness on feet: Secondary | ICD-10-CM

## 2019-01-03 DIAGNOSIS — R293 Abnormal posture: Secondary | ICD-10-CM

## 2019-01-03 DIAGNOSIS — Z9889 Other specified postprocedural states: Secondary | ICD-10-CM

## 2019-01-03 MED ORDER — PRIMIDONE 50 MG PO TABS: 50.0000 mg | ORAL_TABLET | Freq: Four times a day (QID) | ORAL | 5 refills | Status: DC

## 2019-01-03 MED ORDER — CETIRIZINE HCL 10 MG PO TABS: 10.0000 mg | ORAL_TABLET | Freq: Every day | ORAL | 3 refills | Status: DC

## 2019-01-03 NOTE — Telephone Encounter (Signed)
Copied from [place] [phone]. Topic: General - Inquiry >> Jan 02, 2019  3:51 PM Mathis Bud wrote: Reason for CRM: Patient is requesting a referral for a pain clinic due to back pain.  Patient states [facility] for [facility] did not due her any good.  Call back [phone]

## 2019-01-03 NOTE — Therapy (Signed)
[facility] [address] [place], [place], [address] Phone: [phone]   Fax:  [phone]  Physical Therapy Treatment  Patient Details  Name: Mallory Klein MRN: [id number] Date of Birth: Jul 22, 1945 Referring Provider (PT): Tat, Wells Guiles   Encounter Date: 01/03/2019  PT End of Session - 01/03/19 1623    Visit Number  2    Number of Visits  9    Date for PT Re-Evaluation  03/26/19    Authorization Type  UHC Medicare    PT Start Time  [id number]    PT Stop Time  1617    PT Time Calculation (min)  46 min    Equipment Utilized During Treatment  Gait belt    Activity Tolerance  Patient tolerated treatment well;Patient limited by fatigue    Behavior During Therapy  [facility] for tasks assessed/performed       Past Medical History:  Diagnosis Date  . Anxiety   . Asthma   . Depression   . Diverticulosis of colon (without mention of hemorrhage)   . Esophageal stricture   . GERD (gastroesophageal reflux disease)   . Heart murmur   . Hypertension   . Internal hemorrhoids without mention of complication   . Osteoarthritis   . Shortness of breath dyspnea    with allergies    Past Surgical History:  Procedure Laterality Date  . ABDOMINAL HYSTERECTOMY    . BREAST CYST EXCISION Left 1980's  . LUMBAR LAMINECTOMY/DECOMPRESSION MICRODISCECTOMY Bilateral 02/20/2014   Procedure: LUMBAR TWO THREE, LUMBAR THREE FOUR LUMBAR LAMINECTOMY/DECOMPRESSION MICRODISCECTOMY 2 LEVELS;  Surgeon: Eustace Moore, MD;  Location: [facility];  Service: Neurosurgery;  Laterality: Bilateral;  . SPLENECTOMY    . TIBIA FRACTURE SURGERY Right   . TIBIA FRACTURE SURGERY Left   . TOTAL KNEE ARTHROPLASTY Right 2013    There were no vitals filed for this visit.  Subjective Assessment - 01/03/19 1539    Subjective  Went to the pain clinic at [facility] this past Friday. Got a referral for PT for low back pain. Will be going back next week. No falls. Has not  been doing the exercises at home.    Pertinent History  PMH includes back surgery, back pain    Patient Stated Goals  Pt reports her goals are to stop shaking and to get on a better schedule.  Husband wants her to get stronger and have better.    Currently in Pain?  Yes    Pain Score  8     Pain Location  --   Leg   Pain Orientation  Left    Pain Descriptors / Indicators  Throbbing    Pain Onset  More than a month ago    Aggravating Factors   weather change                       [facility] Adult PT Treatment/Exercise - 01/03/19 0001      Transfers   Transfers  Sit to Stand;Stand to Sit    Sit to Stand  5: Supervision;With upper extremity assist;From chair/3-in-1    Sit to Stand Details  Verbal cues for sequencing;Verbal cues for technique;Verbal cues for precautions/safety    Sit to Stand Details (indicate cue type and reason)  2 x 5 reps, plus additional 3 reps throughout session, cues for anterior forward weight shift and using BUE to push up from arm chair vs. using rollator to stand, takes increased time to  perform 5 reps       Exercises   Exercises  Other Exercises    Other Exercises   At countertop: 2 x 10 reps heel raises, 2 x 10 reps hamstring curls B, 1 x 10 reps toe raises, 1 x 10 reps B lateral stepping       Access Code: [email or web address]  URL: [email or web address]  Date: 01/03/2019  Prepared by: Janann August   Reviewed pt's sitting exercise from previous session as she has not been doing these at home. Added countertop exercises as well as sit <> stands from arm chair.   Exercises Seated March - 10 reps - 1-2 sets - 1x daily - 3x weekly Seated Long Arc Quad - 10 reps - 1-2 sets - 1x daily - 3x weekly Seated Ankle Pumps - 10 reps - 1-2 sets - 1x daily - 5x weekly Sit to Stand with Armchair - 5 reps - 2 sets - 1x daily - 7x  - cues for upright posture once in standing. Standing Knee Flexion AROM with Chair Support - 10 reps - 1-2 sets - 1x daily -  4-5x weekly Heel rises with counter support - 10 reps - 1-2 sets - 1x daily - 4-5x weekly Side Stepping with Counter Support - 10 reps - 1-2 sets - 1x daily - 4-5x weekly        PT Education - 01/03/19 1623    Education Details  additions to HEP - sit <> stands, countertop exercises    Person(s) Educated  Patient;Spouse    Methods  Explanation;Demonstration;Handout    Comprehension  Verbalized understanding;Returned demonstration       PT Short Term Goals - 12/26/18 1839      PT SHORT TERM GOAL #1   Title  Pt will perform HEP with family supervision, for improved balance, transfers, and gait.  TARGET 01/27/2019    Time  4    Period  Weeks    Status  New    Target Date  01/27/19      PT SHORT TERM GOAL #2   Title  Pt will improve TUG score to less than or equal to 28 seconds for decreased fall risk, improved ADL participation in the home.    Time  4    Period  Weeks    Status  New    Target Date  01/27/19      PT SHORT TERM GOAL #3   Title  Pt will perform 3 reps of sit<>stand with UE support in less than or equal to 28 seconds for improved transfer efficiency and safety.    Time  4    Period  Weeks    Status  New    Target Date  01/27/19      PT SHORT TERM GOAL #4   Title  Pt/husband will verbalize understanding of fall prevention in home environment.    Time  4    Period  Weeks    Status  New    Target Date  01/27/19        PT Long Term Goals - 12/26/18 1842      PT LONG TERM GOAL #1   Title  Pt will perform progression of HEP for improved balance, strength, and gait.  TARGET 02/23/2019    Time  8    Period  Weeks    Status  New    Target Date  02/23/19      PT LONG TERM GOAL #2   Title  Pt will improve TUG score to less than or equal to 23 seconds for decreased fall risk.    Time  8    Period  Weeks    Status  New    Target Date  02/23/19      PT LONG TERM GOAL #3   Title  Pt will improve gait velocity to at least 1.8 ft/sec for improved gait  efficiency and safety.    Time  8    Period  Weeks    Target Date  02/23/19            Plan - 01/03/19 1627    Clinical Impression Statement  Focus of today's skilled session was sit <> stands, sitting and standing exercises for LE strength and standing tolerance. Pt reports her husband has to help her up with sit <> stands at home ,reviewed technique and anterior weight shifting using BUE. Pt takes increased time to perform 5 reps. Pt needed frequent seated rest breaks throughout session today after standing exercises due to fatigue. Educated pt on importance of performing HEP 4-5x a week for improved activity tolerance and strengthening. Will continue to progress towards LTGs.    Personal Factors and Comorbidities  Comorbidity 3+    Comorbidities  PMH includes anxiety, asthma, depression, GERD, HTN, OA, back pain, lumbar laminectomy 2015, R tKR    Examination-Activity Limitations  Stand;Locomotion Level;Transfers    Examination-Participation Restrictions  Community Activity    Stability/Clinical Decision Making  Evolving/Moderate complexity    Rehab Potential  Good    PT Frequency  1x / week   PT recommends 2x/wk, but pt prefers 1x/wk due to financial (copay) concerns   PT Duration  8 weeks   plus eval   PT Treatment/Interventions  ADLs/Self Care Home Management;Gait training;Functional mobility training;Therapeutic activities;Therapeutic exercise;Balance training;DME Instruction;Neuromuscular re-education;Patient/family education    PT Next Visit Plan  Briefly review HEP sent via [place]; add additional strengthening and balance exercises, including sit<>stand; walking program for HEP. Continue sit <> stands, any standing exercises for activity tolerance.    PT Home Exercise Plan  [facility]    Consulted and Agree with Plan of Care  Patient;Family member/caregiver    Family Member Consulted  Husband       Patient will benefit from skilled therapeutic intervention in order to  improve the following deficits and impairments:  Abnormal gait, Difficulty walking, Decreased balance, Decreased mobility, Decreased strength, Postural dysfunction  Visit Diagnosis: Unsteadiness on feet  Other abnormalities of gait and mobility  Muscle weakness (generalized)  Abnormal posture     Problem List Patient Active Problem List   Diagnosis Date Noted  . Sweating profusely 12/08/2018  . Insomnia disorder 12/08/2018  . Cerumen impaction 10/19/2018  . Thrombocytosis ([place]) 05/24/2017  . Iron deficiency anemia 04/23/2017  . Cough 05/12/2016  . Memory problem 09/03/2015  . Abnormal CBC 02/05/2015  . Edema 10/15/2014  . Incontinence in female 10/15/2014  . S/P lumbar laminectomy 02/20/2014  . Pre-ulcerative calluses 02/09/2014  . Parkinson disease ([place]) 02/09/2014  . Cold sore 10/17/2013  . Low back pain 07/25/2013  . Heel ulcer ([place]) 03/08/2012  . Rash 12/04/2011  . Neck pain on left side 08/06/2011  . Grief at loss of child 08/06/2011  . Well adult exam 03/02/2011  . Hyperglycemia 03/02/2011  . Knee pain, right 09/08/2010  . OSA (obstructive sleep apnea) 02/14/2010  . SWEATING 02/14/2010  . BRONCHITIS, ACUTE 04/05/2009  . VERTIGO 12/02/2007  . ABNORMAL GLUCOSE NEC 12/02/2007  .  OTITIS MEDIA 10/26/2007  . HEADACHE 10/26/2007  . OTHER MALAISE AND FATIGUE 05/11/2007  . Anxiety state 02/15/2007  . Adjustment disorder with mixed anxiety and depressed mood 02/15/2007  . GERD 02/15/2007  . Osteoarthritis 02/15/2007  . POSTHERPETIC TRIGEMINAL NEURALGIA 01/26/2007  . HERPES ZOSTER 01/26/2007  . OBESITY, MORBID 01/26/2007  . MIGRAINE HEADACHE 01/26/2007  . Essential hypertension 01/26/2007  . Asthma 01/26/2007  . Urinary incontinence 01/26/2007    Arliss Journey, PT, DPT  01/03/2019, 4:31 PM  [facility] [address] [place], [place], [address] Phone: [phone]   Fax:  [phone]  Name:  Mallory Klein MRN: [id number] Date of Birth: [birth year]

## 2019-01-03 NOTE — Telephone Encounter (Signed)
RX sent

## 2019-01-03 NOTE — Patient Instructions (Signed)
Access Code: [email or web address]  URL: [email or web address]  Date: 01/03/2019  Prepared by: Janann August   Exercises Seated March - 10 reps - 1-2 sets - 1x daily - 3x weekly Seated Long Arc Quad - 10 reps - 1-2 sets - 1x daily - 3x weekly Seated Ankle Pumps - 10 reps - 1-2 sets - 1x daily - 5x weekly Sit to Stand with Armchair - 5 reps - 2 sets - 1x daily - 7x weekly Standing Knee Flexion AROM with Chair Support - 10 reps - 1-2 sets - 1x daily - 4-5x weekly Heel rises with counter support - 10 reps - 1-2 sets - 1x daily - 4-5x weekly Side Stepping with Counter Support - 10 reps - 1-2 sets - 1x daily - 4-5x weekly

## 2019-01-04 DIAGNOSIS — M545 Low back pain: Secondary | ICD-10-CM | POA: Diagnosis not present

## 2019-01-04 DIAGNOSIS — G8929 Other chronic pain: Secondary | ICD-10-CM | POA: Diagnosis not present

## 2019-01-04 DIAGNOSIS — G20 Parkinson's disease: Secondary | ICD-10-CM | POA: Diagnosis not present

## 2019-01-04 NOTE — Addendum Note (Signed)
Addended by: Cassandria Anger on: 01/04/2019 12:12 AM   Modules accepted: Orders

## 2019-01-04 NOTE — Telephone Encounter (Signed)
Okay.  Thanks.

## 2019-01-10 NOTE — Telephone Encounter (Signed)
Pt says that she spoke with her insurance company and they suggested to have some of her referred services to be done at home.    Pt would like assistance with this further.

## 2019-01-10 NOTE — Telephone Encounter (Signed)
Pt says that her insurance company suggested [facility] for her.

## 2019-01-13 ENCOUNTER — Ambulatory Visit: Payer: Medicare Other | Admitting: Physical Therapy

## 2019-01-13 NOTE — Telephone Encounter (Signed)
Patient is calling back to check on the orders to [facility].  Patient is stating that she has back and spine problems. She goes to [facility] in October, the shot did not do any good. Her back is bend over. She was told that she needs PT. She is not mobile and she is not her feet. She can barely get around she is in a lot of pain.  The patient is wanting a referral for PT, acupuncture, to keep her pain down. , and the pain clinic.  The insurance company referred her to home health care - Patient states it is up to Dr. Alain Marion if he refers her to [facility] or another company.  As long, as the insurance covers it.  Patient was going to [facility] for a pain clinic for shots in her back. The first time it worked, the second time is did not work.  Dr. Alain Marion has given the patient gabapentin for pain but it is not helping.  Patient is asking is there anything else that can be given for pain?  Her back and knees are painful.  Please advise CB- [phone]

## 2019-01-13 NOTE — Telephone Encounter (Signed)
[facility] for what?  Thanks

## 2019-01-17 ENCOUNTER — Other Ambulatory Visit: Payer: Self-pay | Admitting: Internal Medicine

## 2019-01-17 DIAGNOSIS — M8949 Other hypertrophic osteoarthropathy, multiple sites: Secondary | ICD-10-CM

## 2019-01-17 DIAGNOSIS — G8929 Other chronic pain: Secondary | ICD-10-CM

## 2019-01-17 DIAGNOSIS — Z9889 Other specified postprocedural states: Secondary | ICD-10-CM

## 2019-01-17 DIAGNOSIS — M159 Polyosteoarthritis, unspecified: Secondary | ICD-10-CM

## 2019-01-17 DIAGNOSIS — G20 Parkinson's disease: Secondary | ICD-10-CM

## 2019-01-17 DIAGNOSIS — M5440 Lumbago with sciatica, unspecified side: Secondary | ICD-10-CM

## 2019-01-17 NOTE — Telephone Encounter (Signed)
Done. Thx.

## 2019-01-18 ENCOUNTER — Ambulatory Visit: Payer: Medicare Other | Admitting: Physical Therapy

## 2019-01-25 ENCOUNTER — Ambulatory Visit: Payer: Medicare Other | Admitting: Physical Therapy

## 2019-01-26 ENCOUNTER — Encounter: Payer: Self-pay | Admitting: Internal Medicine

## 2019-01-26 ENCOUNTER — Ambulatory Visit (INDEPENDENT_AMBULATORY_CARE_PROVIDER_SITE_OTHER): Payer: Medicare Other | Admitting: Internal Medicine

## 2019-01-26 DIAGNOSIS — E559 Vitamin D deficiency, unspecified: Secondary | ICD-10-CM | POA: Diagnosis not present

## 2019-01-26 DIAGNOSIS — M159 Polyosteoarthritis, unspecified: Secondary | ICD-10-CM

## 2019-01-26 DIAGNOSIS — M5440 Lumbago with sciatica, unspecified side: Secondary | ICD-10-CM

## 2019-01-26 DIAGNOSIS — G8929 Other chronic pain: Secondary | ICD-10-CM

## 2019-01-26 DIAGNOSIS — F411 Generalized anxiety disorder: Secondary | ICD-10-CM | POA: Diagnosis not present

## 2019-01-26 DIAGNOSIS — M8949 Other hypertrophic osteoarthropathy, multiple sites: Secondary | ICD-10-CM

## 2019-01-26 DIAGNOSIS — G20 Parkinson's disease: Secondary | ICD-10-CM

## 2019-01-26 HISTORY — DX: Vitamin D deficiency, unspecified: E55.9

## 2019-01-26 MED ORDER — VITAMIN D3 50 MCG (2000 UT) PO CAPS: 2000.0000 [IU] | ORAL_CAPSULE | Freq: Every day | ORAL | 3 refills | Status: DC

## 2019-01-26 NOTE — Assessment & Plan Note (Signed)
ALPRAZOLAM prn  Potential benefits of a long term benzodiazepines  use as well as potential risks  and complications were explained to the patient and were aknowledged.

## 2019-01-26 NOTE — Assessment & Plan Note (Signed)
Re-start Vit D 

## 2019-01-26 NOTE — Progress Notes (Addendum)
Virtual Visit via Video Note  I connected with Mallory Klein on 01/26/19 at  4:00 PM EST by a video enabled telemedicine application and verified that I am speaking with the correct person using two identifiers.   I discussed the limitations of evaluation and management by telemedicine and the availability of in person appointments. The patient expressed understanding and agreed to proceed.  History of Present Illness: We need to follow-up on LBP - worse, OA, vit D def, insomnia f/u  There has been no runny nose, cough, chest pain, shortness of breath, abdominal pain, diarrhea, constipation,  skin rashes.   Observations/Objective: The patient appears to be in no acute distress, looks well.  Assessment and Plan:  See my Assessment and Plan.  Follow Up Instructions:    I discussed the assessment and treatment plan with the patient. The patient was provided an opportunity to ask questions and all were answered. The patient agreed with the plan and demonstrated an understanding of the instructions.   The patient was advised to call back or seek an in-person evaluation if the symptoms worsen or if the condition fails to improve as anticipated.  I provided face-to-face time during this encounter. We were at different locations.   Walker Kehr, MD

## 2019-01-26 NOTE — Assessment & Plan Note (Addendum)
Worse -- we increased Gabapenin 600 mg tid Start PT at home

## 2019-01-26 NOTE — Assessment & Plan Note (Signed)
Start PT 

## 2019-02-02 DIAGNOSIS — I10 Essential (primary) hypertension: Secondary | ICD-10-CM | POA: Diagnosis not present

## 2019-02-02 DIAGNOSIS — G20 Parkinson's disease: Secondary | ICD-10-CM | POA: Diagnosis not present

## 2019-02-02 DIAGNOSIS — J45909 Unspecified asthma, uncomplicated: Secondary | ICD-10-CM | POA: Diagnosis not present

## 2019-02-02 DIAGNOSIS — Z9181 History of falling: Secondary | ICD-10-CM | POA: Diagnosis not present

## 2019-02-02 DIAGNOSIS — M5440 Lumbago with sciatica, unspecified side: Secondary | ICD-10-CM | POA: Diagnosis not present

## 2019-02-02 DIAGNOSIS — Z79891 Long term (current) use of opiate analgesic: Secondary | ICD-10-CM | POA: Diagnosis not present

## 2019-02-02 DIAGNOSIS — G8929 Other chronic pain: Secondary | ICD-10-CM | POA: Diagnosis not present

## 2019-02-02 DIAGNOSIS — M170 Bilateral primary osteoarthritis of knee: Secondary | ICD-10-CM | POA: Diagnosis not present

## 2019-02-03 ENCOUNTER — Telehealth: Payer: Self-pay | Admitting: Internal Medicine

## 2019-02-03 NOTE — Telephone Encounter (Signed)
Home Health Verbal Orders - Caller/Agency: Monique with [facility] Number: [phone], OK to leave a message Requesting OT/PT/Skilled Nursing/Social Work/Speech Therapy: PT Frequency: 1 week 1, 2 week 5, 1 week 2 for strengthening, safe transfers, gait/balance training, home exercise program, pain management, and home safety.

## 2019-02-06 NOTE — Telephone Encounter (Signed)
LM giving verbals 

## 2019-02-08 ENCOUNTER — Ambulatory Visit (INDEPENDENT_AMBULATORY_CARE_PROVIDER_SITE_OTHER): Payer: Medicare Other | Admitting: Psychology

## 2019-02-08 ENCOUNTER — Ambulatory Visit: Payer: Medicare Other | Admitting: Psychology

## 2019-02-08 ENCOUNTER — Encounter: Payer: Self-pay | Admitting: Psychology

## 2019-02-08 ENCOUNTER — Other Ambulatory Visit: Payer: Self-pay

## 2019-02-08 DIAGNOSIS — G20 Parkinson's disease: Secondary | ICD-10-CM

## 2019-02-08 DIAGNOSIS — R413 Other amnesia: Secondary | ICD-10-CM

## 2019-02-08 DIAGNOSIS — G3184 Mild cognitive impairment, so stated: Secondary | ICD-10-CM | POA: Diagnosis not present

## 2019-02-08 NOTE — Progress Notes (Signed)
   Psychometrician Note   Mallory Klein completed 130 minutes of neuropsychological testing with technician, Milana Kidney, B.S., under the supervision of Dr. Christia Reading, Ph.D., licensed psychologist. The patient did not appear overtly distressed by the testing session, per behavioral observation or via self-report to the technician. Rest breaks were offered.    In considering the patient's current level of functioning, level of presumed impairment, nature of symptoms, emotional and behavioral responses during the interview, level of literacy, and observed level of motivation/effort, a battery of tests was selected and communicated to the psychometrician.   Communication between the psychologist and technician was ongoing throughout the testing session and changes were made as deemed necessary based on patient performance on testing, technician observations and additional pertinent factors such as those listed above.   Mallory Klein will return within approximately two weeks for an interactive feedback session with [name] at which time his test performances, clinical impressions, and treatment recommendations will be reviewed in detail. The patient understands she can contact our office should she require our assistance before this time.  130 minutes were spent face-to-face with patient administering standardized tests and 15 minutes were spent scoring (technician). [CPT [id number], [id number]  This note reflects time spent with the psychometrician and does not include test scores or any clinical interpretations made by [name]. The full report will follow in a separate note.

## 2019-02-08 NOTE — Progress Notes (Signed)
NEUROPSYCHOLOGICAL EVALUATION [place] Department of Neurology  Reason for Referral:   Mallory Klein is a 73 y.o. African-American female referred by Alonza Bogus, D.O., to characterize her current cognitive functioning and assist with diagnostic clarity and treatment planning in the context of subjective cognitive decline and Parkinson's disease.  Assessment and Plan:   Clinical Impression(s): Mallory Klein pattern of performance exhibited notable variability, with primary weaknesses surrounding processing speed, cognitive flexibility, response inhibition, confrontation naming, and encoding (i.e., learning) aspects of memory. This pattern of performance is largely consistent with what can be seen in individuals with Parkinson's disease. However, impairments in confrontation naming were unexpected, and Mallory Klein performed better than what would likely be expected across domains of attention/concentration and visuospatial functioning. Overall, given evidence for cognitive dysfunction, coupled with Mallory Klein and her husband's report of her intact ability to complete activities of daily living (ADLs), she meets criteria for a Mild Neurocognitive Disorder (formerly "mild cognitive impairment"), likely due to Parkinson's disease, at the present time.  Specific to memory, Mallory Klein demonstrated significant difficulties learning novel information across 2/3 memory tests. While performance across delayed recall and consolidation aspects of these tests were very poor (even amnestic at times), this could be due to the potential that Mallory Klein never effectively learned this information in the first place. Across the memory test where she did demonstrate an appropriate learning of the material, retrieval and consolidation aspects were also appropriate and not suggestive of the rapid loss of information. Therefore, despite poor memory and confrontation naming  scores, I do not see compelling evidence for comorbid Alzheimer's disease at the present time. Behavioral characteristics were also not suggestive of a Lewy body dementia or other parkinsonian presentation at the present time. However the benefit or lack thereof of levodopa medication remains to be seen given adherence difficulties. Continued medical monitoring will be important moving forward.   Additional factors which can compromise cognitive efficiency include variable medication adherence (Mallory Klein reported continued difficulties taking her medications on time as she sleeps in late), as well as obstructive sleep apnea. It is likely that a combination of these factors negatively influence day-to-day cognitive difficulties which Mallory Klein has been experiencing presently.  Recommendations: A repeat neuropsychological evaluation in 18-24 months (or sooner if functional decline is noted) is recommended to assess the trajectory of future cognitive decline should it occur. This will also aid in future efforts towards improved diagnostic clarity.  Mallory Klein is strongly encouraged to adhere to medication instructions explained by [name]. Based upon records, the earliest dosing time is believed to be 10am. Mallory Klein is encouraged to set an alarm to 10am (if she continues to sleep later than this) to ensure that she is taking her medications as prescribed. She is also encouraged to plan snacks or small meals around her dosing times should she experience poor side effects from taking this medication on an empty stomach. Poorly controlled symptoms of Parkinson's disease can lead to a worsening clinical presentation (including worsening mood-related symptoms).  Mallory Klein is also encouraged to speak with a sleep specialist regarding ways to treat her sleep apnea. While she reported prior CPAP use, this is not currently occurring per her report, leaving this condition possibly untreated entirely.  Untreated sleep apnea can create cognitive difficulties and will also place her at an increased risk for stroke, heart attack, and future cognitive decline.  When learning new information, she would benefit from information being broken up  into small, manageable pieces. She may also find it helpful to articulate the material in her own words and in a context to promote encoding at the onset of a new task. This material may need to be repeated multiple times to promote encoding.  She should routinely use memory compensatory techniques. It is also recommended that she utilize multiple strategies when learning new information, such as repeating it, writing it down, and putting it into her own words to promote encoding through multi-modal learning.  To address problems with processing speed, she may wish to consider:   -Scheduling more difficult activities for a time of day when she is usually most alert   -Ensuring that she is alerted when essential material or instructions are being presented   -Adjusting the speed at which new information is presented   -Allowing additional processing time or a chance to rehearse novel information   -Allowing for more time in comprehending, processing, and responding in conversation  To address problems with executive functioning, she may wish to consider:   -Avoiding external distractions when needing to concentrate   -Limiting exposure to fast paced environments with multiple sensory demands   -Writing down complicated information and using checklists   -Attempting and completing one task at a time (i.e., no multi-tasking)   -Verbalizing aloud each step of a task to maintain focus   -Reducing the amount of information considered at one time  Review of Records:   Mallory Klein was seen by [facility] Wells Guiles Tat, D.O.) on 11/15/2018 for an evaluation of tremor. Ms. Kjar was seen on October 21, 2017 at orthopedics at [facility]. She was seen for back pain,  but during the visit she complained about unsteady gait and tremor. They were concerned about a possible progressive neurologic condition that would affect the outcome of back surgery. She was therefore referred to [name]. During the current appointment, there was concern about proper medication adherence as Mallory Klein has had trouble taking her morning levodopa dosage at 10am given that she sleeps in late. Regarding cognition, she reported difficulties with short-term memory and was prescribed Aricept by her PCP. Performance on a brief cognitive screening instrument (MOCA) was 21/30. Points were lost across the following domains: visuospatial/executive (3/5), naming (2/3), letter reading (0/1), verbal fluency (0/1), and delayed recall (1/5). Ultimately, she was referred for a comprehensive neuropsychological evaluation to characterize her cognitive abilities and to assist with diagnostic clarity and treatment planning.   No neuroimaging was available for review.  Past Medical History:  Diagnosis Date  . Abnormal CBC 02/05/2015   [name] 12/16 new: normocytic anemia and thrombocytosis  . Adjustment disorder with mixed anxiety and depressed mood 02/15/2007   Chronic  Chronic pain Grief, stress Effexor XR  Dad died in 2023/02/27  . Asthma   . Diverticulosis of colon (without mention of hemorrhage)   . Esophageal stricture   . Essential hypertension 01/26/2007   Chronic Verapamil   . GERD (gastroesophageal reflux disease) 02/15/2007   Chronic    . Heart murmur   . Hyperglycemia 03/02/2011   Mild    . Insomnia disorder 12/08/2018   10/20 Carbid/Lev dose was increased - c/o hard time falling asleep (6 am) getting up at 12 am, poor sleep. Try Temazepam 15-30 mg at 11-1:30 pm   . Internal hemorrhoids without mention of complication   . Iron deficiency anemia 04/23/2017  . OSA (obstructive sleep apnea) 02/14/2010   In lab study (May 2018): AHI 25. Only had 45  minutes of sleep secondary to frequent  awakenings secondary to sleep apnea. autocpap 5-15 cm water.   . Osteoarthritis   . Parkinson disease ([place]) 02/09/2014   2015 2017  Primidone - d/c 2019 Parkinson's: [facility] IR. Dr Tat  . Postherpetic trigeminal neuralgia 01/26/2007   Qualifier: Diagnosis of  By: Marca Ancona RMA, [name]    . Shortness of breath dyspnea    with allergies  . Thrombocytosis ([place]) 05/24/2017  . Urinary incontinence 01/26/2007   Chronic  10/17 Detrol LA   . Vitamin D deficiency 01/26/2019    Past Surgical History:  Procedure Laterality Date  . ABDOMINAL HYSTERECTOMY    . BREAST CYST EXCISION Left 1980's  . LUMBAR LAMINECTOMY/DECOMPRESSION MICRODISCECTOMY Bilateral 02/20/2014   Procedure: LUMBAR TWO THREE, LUMBAR THREE FOUR LUMBAR LAMINECTOMY/DECOMPRESSION MICRODISCECTOMY 2 LEVELS;  Surgeon: Eustace Moore, MD;  Location: [place] [facility];  Service: Neurosurgery;  Laterality: Bilateral;  . SPLENECTOMY    . TIBIA FRACTURE SURGERY Right   . TIBIA FRACTURE SURGERY Left   . TOTAL KNEE ARTHROPLASTY Right 2013    Family History  Problem Relation Age of Onset  . Hypertension Mother   . Diabetes type II Mother   . Asthma Mother   . Diabetes Mother   . Dementia Mother   . Prostate cancer Father   . Mental illness Father        dementia  . Suicidality Son   . Stroke Brother        died in prison  . Colon cancer Neg Hx   . Rectal cancer Neg Hx      Current Outpatient Medications:  .  albuterol (PROVENTIL HFA;VENTOLIN HFA) 108 (90 Base) MCG/ACT inhaler, Inhale 2 puffs into the lungs every 6 (six) hours as needed for wheezing or shortness of breath., Disp: 1 Inhaler, Rfl: 6 .  Carbidopa-Levodopa ER (SINEMET CR) 25-100 MG tablet controlled release, Take 2 tablets at 10AM, 2 at 1PM, 1 at 6PM, 2 at 10PM, Disp: 450 tablet, Rfl: 1 .  cetirizine (ZYRTEC) 10 MG tablet, Take 1 tablet (10 mg total) by mouth daily., Disp: 90 tablet, Rfl: 3 .  Cholecalciferol (VITAMIN D3) 50 MCG (2000 UT) capsule, Take 1 capsule (2,000 Units total) by  mouth daily., Disp: 100 capsule, Rfl: 3 .  donepezil (ARICEPT) 5 MG tablet, Take 1 tablet (5 mg total) by mouth at bedtime., Disp: 90 tablet, Rfl: 3 .  gabapentin (NEURONTIN) 300 MG capsule, TAKE 1 CAPSULE BY MOUTH THREE TIMES A DAY, Disp: 270 capsule, Rfl: 3 .  IRON PO, Take 1 tablet by mouth daily., Disp: , Rfl:  .  montelukast (SINGULAIR) 10 MG tablet, Take 1 tablet (10 mg total) by mouth daily., Disp: 90 tablet, Rfl: 3 .  primidone (MYSOLINE) 50 MG tablet, Take 1 tablet (50 mg total) by mouth 4 (four) times daily., Disp: 90 tablet, Rfl: 5 .  temazepam (RESTORIL) 15 MG capsule, Take 1-2 capsules (15-30 mg total) by mouth at bedtime as needed for sleep., Disp: 60 capsule, Rfl: 3 .  venlafaxine XR (EFFEXOR-XR) 150 MG 24 hr capsule, TAKE 1 CAPSULE BY MOUTH EVERY DAY WITH BREAKFAST, Disp: 90 capsule, Rfl: 3 .  verapamil (VERELAN PM) 120 MG 24 hr capsule, TAKE 1 CAPSULE (120 MG TOTAL) BY MOUTH AT BEDTIME., Disp: 90 capsule, Rfl: 3  Clinical Interview:   Cognitive Symptoms: Decreased short-term memory: Endorsed. She reported symptoms of generalized forgetfulness. When pressed, she identified trouble remembering the names of familiar individuals, as well as occasionally misplacing objects  around the home. Memory deficits were said to be present for the past year or so and were said to have seemed generally stable.  Decreased long-term memory: Denied. Decreased attention/concentration: Denied. Reduced processing speed: Denied. Difficulties with executive functions: Denied. Difficulties with emotion regulation: Denied. Difficulties with receptive language: Denied. Difficulties with word finding: Denied. Decreased visuoperceptual ability: Denied.  Difficulties completing ADLs: Denied.  Additional Medical History: History of traumatic brain injury/concussion: Denied. History of stroke: Denied. History of seizure activity: Denied. History of known exposure to toxins: Denied. Symptoms of chronic  pain: Endorsed. Pain symptoms were localized to her knees and back. She noted that prescribed medications were generally not helpful in mitigating symptoms. She reported being scheduled to start at-home physical therapy later this week.  Experience of frequent headaches/migraines: Denied. Frequent instances of dizziness/vertigo: Denied. Occasional instances of dizziness were said to occur when standing quickly from a previously seated position.   Sensory changes: Ms. Magel reported seeing reasonably well with her glasses. Other sensory changes/difficulties (e.g., hearing, taste, or smell) were denied. Balance/coordination difficulties: Endorsed. Difficulties were attributed to knee/back pain and related surgeries, as well as gait abnormalities due to Parkinson's disease. She did acknowledge a history of falls.  Other motor difficulties: Endorsed. She reported experiencing bilateral tremors, generally more pronounced on the right side of her body. She was unclear if medications have improved these symptoms. However, this could be due to variable adherence as Ms. Trawick sleeps late and has trouble taking her first scheduled dose on time.   Sleep History: Estimated hours obtained each night: 6 hours. Difficulties falling asleep: Endorsed. These have been improved with medication intervention. Mallory Klein noted trying to use this medication on an as needed basis rather than every day. Difficulties staying asleep: Endorsed. She noted waking up to use the restroom, as well as other instances where she wakes up for largely unknown reasons. She also described difficulties falling back asleep when this occurs.  Feels rested and refreshed upon awakening: Endorsed.  History of snoring: Endorsed. History of waking up gasping for air: Endorsed. Witnessed breath cessation while asleep: Endorsed. She reported a history of obstructive sleep apnea. While she has utilized CPAP in the past, she denied currently  using a CPAP machine, making this treatment seem largely untreated currently.   History of vivid dreaming: Endorsed. Excessive movement while asleep: Denied. Instances of acting out her dreams: Denied. However, medical records suggest the positive history of a REM sleep disorder, likely associated with Parkinson's disease.  Psychiatric/Behavioral Health History: Depression: Largely denied. Her husband noted that she likely feels "dismayed" given her various medical ailments. She agreed with this, also describing some distress surrounding the lack of relationship she and her husband currently have with her daughter. She described her current mood as "okay." She denied current or remote instances of suicidal ideation, intent, or plan. Anxiety: Denied. Mania: Denied. Trauma History: Denied. Visual/auditory hallucinations: Denied. Delusional thoughts: Denied.  Tobacco: Denied. Alcohol: She reported rare alcohol consumption, limited to social settings. She denied a history of problematic alcohol abuse or dependence. Recreational drugs: Denied. Caffeine: Denied.  Academic/Vocational History: Highest level of educational attainment: 14 years. Ms. Fiebig graduated from high school and completed an additional 2 years of college. 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 She described herself as a good (B/C) student in academic settings.  History of developmental delay: Denied. History of grade repetition: Denied. Enrollment in special education courses: Denied. History of diagnosed specific learning disability: Denied. History of  ADHD: Denied.  Employment: Retired. 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   Evaluation Results:   Behavioral Observations: Mallory Klein was accompanied by her husband, arrived to her appointment on time, and was appropriately dressed and groomed. She utilized a rolling walker to  ambulate and exhibited appropriate speed and control while operating this device. A resting tremor was observed, most prominently in her upper extremities (right far greater than left). Her affect was generally relaxed and positive, but did range appropriately given the subject being discussed during the clinical interview or the task at hand during testing procedures. Spontaneous speech was fluent and word finding difficulties were not observed during the clinical interview or testing procedures. Sustained attention was appropriate throughout. Thought processes were coherent, organized, and normal in content. Task engagement was adequate and she persisted when challenged. Overall, Mallory Klein was cooperative with the clinical interview and subsequent testing procedures.   Adequacy of Effort: The validity of neuropsychological testing is limited by the extent to which the individual being tested may be assumed to have exerted adequate effort during testing. Mallory Klein expressed her intention to perform to the best of her abilities and exhibited adequate task engagement and persistence. Scores across stand-alone and embedded performance validity measures were variable. However, it is possible that below expectation scores were due to true cognitive dysfunction surrounding memory. However, the results of the current evaluation should be interpreted with a mild degree of caution.  Test Results: Mallory Klein was fully oriented at the time of the current evaluation.  Intellectual abilities based upon educational and vocational attainment were estimated to be in the average range. Premorbid abilities were estimated to be within the below average range based upon a single-word reading test.   Processing speed was variable, ranging from the exceptionally low to average normative ranges, but was largely below expectation. Basic attention was average. More complex attention (e.g., working memory) was also  average. Executive functioning was variable. Weaknesses were noted across response inhibition and variable was seen across cognitive flexibility. Problem solving and nonverbal abstract reasoning were within normal limits.  Assessed receptive language abilities were within normal limits. Likewise, Mallory Klein did not exhibit any difficulties comprehending task instructions and answered all questions asked of her appropriately. Assessed expressive language (e.g., verbal fluency and confrontation naming) was variable. Verbal fluency was largely within normal limits, while confrontation naming was exceptionally low.     Assessed visuospatial/visuoconstructional abilities were largely within normal limits.    Learning (i.e., encoding) of novel verbal and visual information was variable, ranging from the exceptionally low to average normative ranges. Spontaneous delayed recall (i.e., retrieval) of previously learned information was commensurate with performance across initial learning trials. Retention rates were variable. However, she exhibited a largely amnestic profile across 2/3 memory measures. Performance across recognition tasks was variable, but very poor across 2/3 memory measures, suggesting limited evidence for information consolidation.   Results of emotional screening instruments suggested that recent symptoms of generalized anxiety were in the minimal range, while symptoms of depression were within normal limits. A screening instrument assessing recent sleep quality suggested the presence of minimal sleep dysfunction. Responses across a Parkinson's disease symptom questionnaire suggested primary areas of difficulty surrounding mobility and bodily discomfort.  Tables of Scores:   Note: This summary of test scores accompanies the interpretive report and should not be considered in isolation without reference to the appropriate sections in the text. Descriptors are based on appropriate normative  data and  may be adjusted based on clinical judgment. The terms "impaired" and "within normal limits (WNL)" are used when a more specific level of functioning cannot be determined.       Effort Testing:   DESCRIPTOR       Dot Counting Test: --- --- Within Expectation  HVLT-R Recognition Discrimination Index: --- --- Below Expectation  BVMT-R Retention Percentage: --- --- Below Expectation       Orientation:      Raw Score Percentile   NAB Orientation, Form 1 29/29 --- ---       Intellectual Functioning:           Standard Score Percentile   Test of Premorbid Functioning: 87 19 Below Average       Memory:          Wechsler Memory Scale (WMS-IV):                       Raw Score (Scaled Score) Percentile     Logical Memory I 28/53 (9) 37 Average    Logical Memory II 12/39 (8) 25 Average    Logical Memory Recognition 20/23 >75 Above Average       Hopkins Verbal Learning Test (HVLT-R), Form 1: Raw Score (T Score) Percentile     Total Trials 1-3 13/36 (31) 3 Well Below Average    Delayed Recall 1/12 (23) <1 Exceptionally Low    Recognition Discrimination Index 3 (9) <1 Exceptionally Low      True Positives 9 --- ---      False Positives 6 --- ---       Brief Visuospatial Memory Test (BVMT-R), Form 1: Raw Score (T Score) Percentile     Total Trials 1-3 4/36 (21) <1 Exceptionally Low    Delayed Recall 0/12 (20) <1 Exceptionally Low    Recognition Discrimination Index 3 3-5 Well Below Average      Recognition Hits 6/6 >16 Within Normal Limits      False Positive Errors 3 <1 Exceptionally Low        Attention/Executive Function:          Trail Making Test (TMT): Raw Score (T Score) Percentile     Part A 75 secs.,  0 errors (33) 5 Well Below Average    Part B 131 secs.,  1 error (47) 38 Average       Symbol Digit Modalities Test (SDMT): Raw Score (Z-Score) Percentile     Oral 23 (-2.2) 1 Exceptionally Low       NAB Attention Module, Form 1: T Score Percentile     Digits  Forward 48 42 Average    Digits Backwards 49 46 Average       D-KEFS Color-Word Interference Test: Raw Score (Scaled Score) Percentile     Color Naming 50 secs. (3) 1 Exceptionally Low    Word Reading 28 secs. (9) 37 Average    Inhibition 101 secs. (6) 9 Below Average      Total Errors 1 error (12) 75 Above Average    Inhibition/Switching 159 secs. (1) <1 Exceptionally Low      Total Errors 11 errors (2) <1 Exceptionally Low       D-KEFS 20 Questions Test: Scaled Score Percentile     Total Weighted Achievement Score 14 91 Above Average    Initial Abstraction Score 8 25 Average       Language:          Verbal Fluency Test: Raw Score (T  Score) Percentile     Phonemic Fluency (FAS) 23 (41) 18 Below Average    Animal Fluency 12 (45) 31 Average       NAB Language Module, Form 1: T Score Percentile     Auditory Comprehension 55 69 Average    Naming 21/31 (19) <1 Exceptionally Low       Visuospatial/Visuoconstruction:      Raw Score Percentile   Clock Drawing: 9/10 --- Within Normal Limits       NAB Spatial Module, Form 1: T Score Percentile     Visual Discrimination 49 46 Average    Design Construction 42 21 Below Average        Scaled Score Percentile   WAIS-IV Matrix Reasoning: 11 63 Average  WAIS-IV Visual Puzzles: 7 16 Below Average       Mood and Personality:      Raw Score Percentile   Geriatric Depression Scale: 6 --- Within Normal Limits  Geriatric Anxiety Scale: 6 --- Minimal    Somatic 5 --- Minimal    Cognitive 0 --- Minimal    Affective 1 --- Minimal       Additional Questionnaires:      Raw Score Percentile   PROMIS Sleep Disturbance Questionnaire: 18 --- None to Slight       Parkinson's Disease Questionnaire-39: Raw Score Percentile     Mobility 16 40 Moderately Affected    Activities of Daily Living 5 21 Mildly Affected    Emotional Well-Being 3 13 Mildly Affected    Stigma 3 19 Mildly Affected    Social Support 0 0 Minimally Affected    Cognitions 2  13 Mildly Affected    Communication 0 0 Minimally Affected    Bodily Discomfort 7 58 Moderately Affected   Informed Consent and Coding/Compliance:   Ms. Atcheson was provided with a verbal description of the nature and purpose of the present neuropsychological evaluation. Also reviewed were the foreseeable risks and/or discomforts and benefits of the procedure, limits of confidentiality, and mandatory reporting requirements of this provider. The patient was given the opportunity to ask questions and receive answers about the evaluation. Oral consent to participate was provided by the patient.   This evaluation was conducted by Christia Reading, Ph.D., licensed clinical neuropsychologist. Ms. Benton completed a 30-minute clinical interview, billed as one unit [phone], and 145 minutes of cognitive testing, billed as one unit [phone] and four additional units 96139. Psychometrist Milana Kidney, B.S., assisted [name] with test administration and scoring procedures. As a separate and discrete service, [name] spent a total of 180 minutes in interpretation and report writing, billed as one unit 96132 and two units 96133.

## 2019-02-09 ENCOUNTER — Encounter: Payer: Self-pay | Admitting: Psychology

## 2019-02-09 DIAGNOSIS — Z79891 Long term (current) use of opiate analgesic: Secondary | ICD-10-CM | POA: Diagnosis not present

## 2019-02-09 DIAGNOSIS — J45909 Unspecified asthma, uncomplicated: Secondary | ICD-10-CM | POA: Diagnosis not present

## 2019-02-09 DIAGNOSIS — F419 Anxiety disorder, unspecified: Secondary | ICD-10-CM

## 2019-02-09 DIAGNOSIS — M170 Bilateral primary osteoarthritis of knee: Secondary | ICD-10-CM | POA: Diagnosis not present

## 2019-02-09 DIAGNOSIS — G20 Parkinson's disease: Secondary | ICD-10-CM | POA: Diagnosis not present

## 2019-02-09 DIAGNOSIS — G8929 Other chronic pain: Secondary | ICD-10-CM | POA: Diagnosis not present

## 2019-02-09 DIAGNOSIS — M5440 Lumbago with sciatica, unspecified side: Secondary | ICD-10-CM | POA: Diagnosis not present

## 2019-02-09 DIAGNOSIS — Z9181 History of falling: Secondary | ICD-10-CM | POA: Diagnosis not present

## 2019-02-09 DIAGNOSIS — G3184 Mild cognitive impairment, so stated: Secondary | ICD-10-CM

## 2019-02-09 DIAGNOSIS — I10 Essential (primary) hypertension: Secondary | ICD-10-CM | POA: Diagnosis not present

## 2019-02-09 HISTORY — DX: Mild cognitive impairment of uncertain or unknown etiology: G31.84

## 2019-02-10 DIAGNOSIS — M5440 Lumbago with sciatica, unspecified side: Secondary | ICD-10-CM | POA: Diagnosis not present

## 2019-02-10 DIAGNOSIS — Z79891 Long term (current) use of opiate analgesic: Secondary | ICD-10-CM | POA: Diagnosis not present

## 2019-02-10 DIAGNOSIS — G8929 Other chronic pain: Secondary | ICD-10-CM | POA: Diagnosis not present

## 2019-02-10 DIAGNOSIS — I10 Essential (primary) hypertension: Secondary | ICD-10-CM | POA: Diagnosis not present

## 2019-02-10 DIAGNOSIS — M170 Bilateral primary osteoarthritis of knee: Secondary | ICD-10-CM | POA: Diagnosis not present

## 2019-02-10 DIAGNOSIS — Z9181 History of falling: Secondary | ICD-10-CM | POA: Diagnosis not present

## 2019-02-10 DIAGNOSIS — J45909 Unspecified asthma, uncomplicated: Secondary | ICD-10-CM | POA: Diagnosis not present

## 2019-02-10 DIAGNOSIS — G20 Parkinson's disease: Secondary | ICD-10-CM | POA: Diagnosis not present

## 2019-02-14 DIAGNOSIS — M5440 Lumbago with sciatica, unspecified side: Secondary | ICD-10-CM | POA: Diagnosis not present

## 2019-02-14 DIAGNOSIS — Z79891 Long term (current) use of opiate analgesic: Secondary | ICD-10-CM | POA: Diagnosis not present

## 2019-02-14 DIAGNOSIS — M170 Bilateral primary osteoarthritis of knee: Secondary | ICD-10-CM | POA: Diagnosis not present

## 2019-02-14 DIAGNOSIS — J45909 Unspecified asthma, uncomplicated: Secondary | ICD-10-CM | POA: Diagnosis not present

## 2019-02-14 DIAGNOSIS — G8929 Other chronic pain: Secondary | ICD-10-CM | POA: Diagnosis not present

## 2019-02-14 DIAGNOSIS — I10 Essential (primary) hypertension: Secondary | ICD-10-CM | POA: Diagnosis not present

## 2019-02-14 DIAGNOSIS — G20 Parkinson's disease: Secondary | ICD-10-CM | POA: Diagnosis not present

## 2019-02-14 DIAGNOSIS — Z9181 History of falling: Secondary | ICD-10-CM | POA: Diagnosis not present

## 2019-02-21 DIAGNOSIS — Z9181 History of falling: Secondary | ICD-10-CM | POA: Diagnosis not present

## 2019-02-21 DIAGNOSIS — J45909 Unspecified asthma, uncomplicated: Secondary | ICD-10-CM | POA: Diagnosis not present

## 2019-02-21 DIAGNOSIS — M5440 Lumbago with sciatica, unspecified side: Secondary | ICD-10-CM | POA: Diagnosis not present

## 2019-02-21 DIAGNOSIS — G20 Parkinson's disease: Secondary | ICD-10-CM | POA: Diagnosis not present

## 2019-02-21 DIAGNOSIS — I10 Essential (primary) hypertension: Secondary | ICD-10-CM | POA: Diagnosis not present

## 2019-02-21 DIAGNOSIS — Z79891 Long term (current) use of opiate analgesic: Secondary | ICD-10-CM | POA: Diagnosis not present

## 2019-02-21 DIAGNOSIS — M170 Bilateral primary osteoarthritis of knee: Secondary | ICD-10-CM | POA: Diagnosis not present

## 2019-02-21 DIAGNOSIS — G8929 Other chronic pain: Secondary | ICD-10-CM | POA: Diagnosis not present

## 2019-02-22 ENCOUNTER — Ambulatory Visit (INDEPENDENT_AMBULATORY_CARE_PROVIDER_SITE_OTHER): Payer: Medicare Other | Admitting: Psychology

## 2019-02-22 ENCOUNTER — Other Ambulatory Visit: Payer: Self-pay

## 2019-02-22 DIAGNOSIS — G3184 Mild cognitive impairment, so stated: Secondary | ICD-10-CM

## 2019-02-22 NOTE — Progress Notes (Signed)
   Neuropsychology Feedback Session Tillie Rung. [facility] Department of Neurology  Reason for Referral:   ADELYNE BENSCH a 73 y.o. African-American female referred by Alonza Bogus, D.O.,to characterize hercurrent cognitive functioning and assist with diagnostic clarity and treatment planning in the context of subjective cognitive decline and Parkinson's disease.  Feedback:   Ms. Stormont completed a comprehensive neuropsychological evaluation on 02/08/2019. Please refer to that encounter for the full report and recommendations. Briefly, results suggested notable variability, with primary weaknesses surrounding processing speed, cognitive flexibility, response inhibition, confrontation naming, and encoding (i.e., learning) aspects of memory. This pattern of performance is largely consistent with what can be seen in individuals with Parkinson's disease. However, impairments in confrontation naming were unexpected, and Ms. Layfield performed better than what would likely be expected across domains of attention/concentration and visuospatial functioning. Overall, given evidence for cognitive dysfunction, coupled with Ms. Kesecker and her husband's report of her intact ability to complete activities of daily living (ADLs), she meets criteria for a mild neurocognitive disorder (formerly "mild cognitive impairment"), likely due to Parkinson's disease, at the present time.  Ms. Mcniece was unaccompanied during the current telephone call. Content of the current session focused on the results of her neuropsychological evaluation. Ms. Brackley was given the opportunity to ask questions and her questions were answered. she was also encouraged to reach out should additional questions arise.     A total of 15 minutes were spent with Ms. Arth during the current feedback session.

## 2019-03-01 DIAGNOSIS — Z0001 Encounter for general adult medical examination with abnormal findings: Secondary | ICD-10-CM | POA: Diagnosis not present

## 2019-03-01 DIAGNOSIS — D509 Iron deficiency anemia, unspecified: Secondary | ICD-10-CM | POA: Diagnosis not present

## 2019-03-03 ENCOUNTER — Telehealth: Payer: Self-pay | Admitting: Internal Medicine

## 2019-03-03 NOTE — Telephone Encounter (Signed)
Routing to dr plotnikov, fyi.... 

## 2019-03-03 NOTE — Telephone Encounter (Signed)
Mallory Klein with [facility] called in to make provider aware that pt declined visit today and requested to have it added to her last visit point of care.  CB: [id number]

## 2019-03-07 DIAGNOSIS — Z79891 Long term (current) use of opiate analgesic: Secondary | ICD-10-CM | POA: Diagnosis not present

## 2019-03-07 DIAGNOSIS — Z9181 History of falling: Secondary | ICD-10-CM | POA: Diagnosis not present

## 2019-03-07 DIAGNOSIS — I10 Essential (primary) hypertension: Secondary | ICD-10-CM | POA: Diagnosis not present

## 2019-03-07 DIAGNOSIS — M170 Bilateral primary osteoarthritis of knee: Secondary | ICD-10-CM | POA: Diagnosis not present

## 2019-03-07 DIAGNOSIS — J45909 Unspecified asthma, uncomplicated: Secondary | ICD-10-CM | POA: Diagnosis not present

## 2019-03-07 DIAGNOSIS — M5440 Lumbago with sciatica, unspecified side: Secondary | ICD-10-CM | POA: Diagnosis not present

## 2019-03-07 DIAGNOSIS — G8929 Other chronic pain: Secondary | ICD-10-CM | POA: Diagnosis not present

## 2019-03-07 DIAGNOSIS — G20 Parkinson's disease: Secondary | ICD-10-CM | POA: Diagnosis not present

## 2019-03-08 ENCOUNTER — Telehealth: Payer: Self-pay | Admitting: Internal Medicine

## 2019-03-08 DIAGNOSIS — M2170 Unequal limb length (acquired), unspecified site: Secondary | ICD-10-CM

## 2019-03-08 NOTE — Telephone Encounter (Signed)
Please advise 

## 2019-03-08 NOTE — Telephone Encounter (Signed)
Mallory Klein need a rx fax to [facility] phone number [phone] and  fax number [phone] for  build up shoe for leg length discrepancy

## 2019-03-09 DIAGNOSIS — M5440 Lumbago with sciatica, unspecified side: Secondary | ICD-10-CM | POA: Diagnosis not present

## 2019-03-09 DIAGNOSIS — Z9181 History of falling: Secondary | ICD-10-CM | POA: Diagnosis not present

## 2019-03-09 DIAGNOSIS — I10 Essential (primary) hypertension: Secondary | ICD-10-CM | POA: Diagnosis not present

## 2019-03-09 DIAGNOSIS — M170 Bilateral primary osteoarthritis of knee: Secondary | ICD-10-CM | POA: Diagnosis not present

## 2019-03-09 DIAGNOSIS — J45909 Unspecified asthma, uncomplicated: Secondary | ICD-10-CM | POA: Diagnosis not present

## 2019-03-09 DIAGNOSIS — Z79891 Long term (current) use of opiate analgesic: Secondary | ICD-10-CM | POA: Diagnosis not present

## 2019-03-09 DIAGNOSIS — G8929 Other chronic pain: Secondary | ICD-10-CM | POA: Diagnosis not present

## 2019-03-09 DIAGNOSIS — G20 Parkinson's disease: Secondary | ICD-10-CM | POA: Diagnosis not present

## 2019-03-09 NOTE — Telephone Encounter (Signed)
Done and faxed

## 2019-03-09 NOTE — Telephone Encounter (Signed)
Okay.  Thanks.

## 2019-03-10 ENCOUNTER — Telehealth: Payer: Self-pay

## 2019-03-10 NOTE — Telephone Encounter (Signed)
Please advise 

## 2019-03-10 NOTE — Telephone Encounter (Signed)
Pt called to report severe back and left knee pain. Informed pt no appts available today to go to [facility] if needed. Pt states wanting something to relieve the pain. Pt states she will not be able to see the pain specialist until next month. Pt request Plotnikov contact her Monday about possibly calling in pain meds.

## 2019-03-12 MED ORDER — ACETAMINOPHEN-CODEINE 300-30 MG PO TABS: 0.5000 | ORAL_TABLET | Freq: Four times a day (QID) | ORAL | 0 refills | Status: DC | PRN

## 2019-03-12 NOTE — Telephone Encounter (Signed)
Prescription for Tylenol 3 emailed.  Thanks

## 2019-03-13 NOTE — Telephone Encounter (Signed)
Pt.notified

## 2019-03-14 DIAGNOSIS — Z9181 History of falling: Secondary | ICD-10-CM | POA: Diagnosis not present

## 2019-03-14 DIAGNOSIS — M170 Bilateral primary osteoarthritis of knee: Secondary | ICD-10-CM | POA: Diagnosis not present

## 2019-03-14 DIAGNOSIS — G8929 Other chronic pain: Secondary | ICD-10-CM | POA: Diagnosis not present

## 2019-03-14 DIAGNOSIS — M5440 Lumbago with sciatica, unspecified side: Secondary | ICD-10-CM | POA: Diagnosis not present

## 2019-03-14 DIAGNOSIS — J45909 Unspecified asthma, uncomplicated: Secondary | ICD-10-CM | POA: Diagnosis not present

## 2019-03-14 DIAGNOSIS — Z79891 Long term (current) use of opiate analgesic: Secondary | ICD-10-CM | POA: Diagnosis not present

## 2019-03-14 DIAGNOSIS — I10 Essential (primary) hypertension: Secondary | ICD-10-CM | POA: Diagnosis not present

## 2019-03-14 DIAGNOSIS — G20 Parkinson's disease: Secondary | ICD-10-CM | POA: Diagnosis not present

## 2019-03-16 DIAGNOSIS — G20 Parkinson's disease: Secondary | ICD-10-CM | POA: Diagnosis not present

## 2019-03-16 DIAGNOSIS — Z9181 History of falling: Secondary | ICD-10-CM | POA: Diagnosis not present

## 2019-03-16 DIAGNOSIS — M170 Bilateral primary osteoarthritis of knee: Secondary | ICD-10-CM | POA: Diagnosis not present

## 2019-03-16 DIAGNOSIS — M5440 Lumbago with sciatica, unspecified side: Secondary | ICD-10-CM | POA: Diagnosis not present

## 2019-03-16 DIAGNOSIS — Z79891 Long term (current) use of opiate analgesic: Secondary | ICD-10-CM | POA: Diagnosis not present

## 2019-03-16 DIAGNOSIS — I10 Essential (primary) hypertension: Secondary | ICD-10-CM | POA: Diagnosis not present

## 2019-03-16 DIAGNOSIS — G8929 Other chronic pain: Secondary | ICD-10-CM | POA: Diagnosis not present

## 2019-03-16 DIAGNOSIS — J45909 Unspecified asthma, uncomplicated: Secondary | ICD-10-CM | POA: Diagnosis not present

## 2019-03-21 DIAGNOSIS — G8929 Other chronic pain: Secondary | ICD-10-CM | POA: Diagnosis not present

## 2019-03-21 DIAGNOSIS — Z9181 History of falling: Secondary | ICD-10-CM | POA: Diagnosis not present

## 2019-03-21 DIAGNOSIS — Z79891 Long term (current) use of opiate analgesic: Secondary | ICD-10-CM | POA: Diagnosis not present

## 2019-03-21 DIAGNOSIS — M5440 Lumbago with sciatica, unspecified side: Secondary | ICD-10-CM | POA: Diagnosis not present

## 2019-03-21 DIAGNOSIS — J45909 Unspecified asthma, uncomplicated: Secondary | ICD-10-CM | POA: Diagnosis not present

## 2019-03-21 DIAGNOSIS — M170 Bilateral primary osteoarthritis of knee: Secondary | ICD-10-CM | POA: Diagnosis not present

## 2019-03-21 DIAGNOSIS — I10 Essential (primary) hypertension: Secondary | ICD-10-CM | POA: Diagnosis not present

## 2019-03-21 DIAGNOSIS — G20 Parkinson's disease: Secondary | ICD-10-CM | POA: Diagnosis not present

## 2019-03-23 DIAGNOSIS — Z79891 Long term (current) use of opiate analgesic: Secondary | ICD-10-CM | POA: Diagnosis not present

## 2019-03-23 DIAGNOSIS — M170 Bilateral primary osteoarthritis of knee: Secondary | ICD-10-CM | POA: Diagnosis not present

## 2019-03-23 DIAGNOSIS — G20 Parkinson's disease: Secondary | ICD-10-CM | POA: Diagnosis not present

## 2019-03-23 DIAGNOSIS — I10 Essential (primary) hypertension: Secondary | ICD-10-CM | POA: Diagnosis not present

## 2019-03-23 DIAGNOSIS — Z9181 History of falling: Secondary | ICD-10-CM | POA: Diagnosis not present

## 2019-03-23 DIAGNOSIS — J45909 Unspecified asthma, uncomplicated: Secondary | ICD-10-CM | POA: Diagnosis not present

## 2019-03-23 DIAGNOSIS — M5440 Lumbago with sciatica, unspecified side: Secondary | ICD-10-CM | POA: Diagnosis not present

## 2019-03-23 DIAGNOSIS — G8929 Other chronic pain: Secondary | ICD-10-CM | POA: Diagnosis not present

## 2019-03-28 DIAGNOSIS — M1712 Unilateral primary osteoarthritis, left knee: Secondary | ICD-10-CM | POA: Diagnosis not present

## 2019-03-28 DIAGNOSIS — M48061 Spinal stenosis, lumbar region without neurogenic claudication: Secondary | ICD-10-CM | POA: Diagnosis not present

## 2019-03-28 DIAGNOSIS — M47816 Spondylosis without myelopathy or radiculopathy, lumbar region: Secondary | ICD-10-CM | POA: Diagnosis not present

## 2019-03-28 DIAGNOSIS — Z79891 Long term (current) use of opiate analgesic: Secondary | ICD-10-CM | POA: Diagnosis not present

## 2019-03-28 DIAGNOSIS — G894 Chronic pain syndrome: Secondary | ICD-10-CM | POA: Diagnosis not present

## 2019-03-30 DIAGNOSIS — M170 Bilateral primary osteoarthritis of knee: Secondary | ICD-10-CM | POA: Diagnosis not present

## 2019-03-30 DIAGNOSIS — Z79891 Long term (current) use of opiate analgesic: Secondary | ICD-10-CM | POA: Diagnosis not present

## 2019-03-30 DIAGNOSIS — I10 Essential (primary) hypertension: Secondary | ICD-10-CM | POA: Diagnosis not present

## 2019-03-30 DIAGNOSIS — G8929 Other chronic pain: Secondary | ICD-10-CM | POA: Diagnosis not present

## 2019-03-30 DIAGNOSIS — J45909 Unspecified asthma, uncomplicated: Secondary | ICD-10-CM | POA: Diagnosis not present

## 2019-03-30 DIAGNOSIS — M5440 Lumbago with sciatica, unspecified side: Secondary | ICD-10-CM | POA: Diagnosis not present

## 2019-03-30 DIAGNOSIS — Z9181 History of falling: Secondary | ICD-10-CM | POA: Diagnosis not present

## 2019-03-30 DIAGNOSIS — G20 Parkinson's disease: Secondary | ICD-10-CM | POA: Diagnosis not present

## 2019-04-04 ENCOUNTER — Telehealth: Payer: Self-pay | Admitting: Hematology

## 2019-04-04 DIAGNOSIS — G20 Parkinson's disease: Secondary | ICD-10-CM | POA: Diagnosis not present

## 2019-04-04 DIAGNOSIS — Z79891 Long term (current) use of opiate analgesic: Secondary | ICD-10-CM | POA: Diagnosis not present

## 2019-04-04 DIAGNOSIS — I10 Essential (primary) hypertension: Secondary | ICD-10-CM | POA: Diagnosis not present

## 2019-04-04 DIAGNOSIS — J45909 Unspecified asthma, uncomplicated: Secondary | ICD-10-CM | POA: Diagnosis not present

## 2019-04-04 DIAGNOSIS — G8929 Other chronic pain: Secondary | ICD-10-CM | POA: Diagnosis not present

## 2019-04-04 DIAGNOSIS — M5440 Lumbago with sciatica, unspecified side: Secondary | ICD-10-CM | POA: Diagnosis not present

## 2019-04-04 DIAGNOSIS — Z9181 History of falling: Secondary | ICD-10-CM | POA: Diagnosis not present

## 2019-04-04 DIAGNOSIS — M170 Bilateral primary osteoarthritis of knee: Secondary | ICD-10-CM | POA: Diagnosis not present

## 2019-04-04 NOTE — Telephone Encounter (Signed)
I talk with patient regarding reschedule for 28mo per MD

## 2019-04-05 DIAGNOSIS — H0259 Other disorders affecting eyelid function: Secondary | ICD-10-CM | POA: Diagnosis not present

## 2019-04-05 DIAGNOSIS — H18599 Other hereditary corneal dystrophies, unspecified eye: Secondary | ICD-10-CM | POA: Diagnosis not present

## 2019-04-05 DIAGNOSIS — Z961 Presence of intraocular lens: Secondary | ICD-10-CM | POA: Diagnosis not present

## 2019-04-06 ENCOUNTER — Ambulatory Visit: Payer: Medicare Other | Admitting: Hematology

## 2019-04-06 ENCOUNTER — Other Ambulatory Visit: Payer: Medicare Other

## 2019-04-06 DIAGNOSIS — G8929 Other chronic pain: Secondary | ICD-10-CM | POA: Diagnosis not present

## 2019-04-06 DIAGNOSIS — G20 Parkinson's disease: Secondary | ICD-10-CM | POA: Diagnosis not present

## 2019-04-06 DIAGNOSIS — M170 Bilateral primary osteoarthritis of knee: Secondary | ICD-10-CM | POA: Diagnosis not present

## 2019-04-06 DIAGNOSIS — J45909 Unspecified asthma, uncomplicated: Secondary | ICD-10-CM | POA: Diagnosis not present

## 2019-04-06 DIAGNOSIS — Z79891 Long term (current) use of opiate analgesic: Secondary | ICD-10-CM | POA: Diagnosis not present

## 2019-04-06 DIAGNOSIS — M5440 Lumbago with sciatica, unspecified side: Secondary | ICD-10-CM | POA: Diagnosis not present

## 2019-04-06 DIAGNOSIS — Z9181 History of falling: Secondary | ICD-10-CM | POA: Diagnosis not present

## 2019-04-06 DIAGNOSIS — I10 Essential (primary) hypertension: Secondary | ICD-10-CM | POA: Diagnosis not present

## 2019-04-10 ENCOUNTER — Telehealth: Payer: Self-pay | Admitting: Internal Medicine

## 2019-04-10 DIAGNOSIS — M25562 Pain in left knee: Secondary | ICD-10-CM

## 2019-04-10 NOTE — Telephone Encounter (Signed)
   What type of DME ([place]) would like your provider to order? Knee brace, left knee  Who would like to get the DME from? [facility] and [place], [name] cell [phone], office [phone]  Last visit with PCP (>3 months requires and appointment for insurance to cover the cost = please schedule patient for visit to discuss medical necessity for DME)? 01/26/19

## 2019-04-11 DIAGNOSIS — I10 Essential (primary) hypertension: Secondary | ICD-10-CM | POA: Diagnosis not present

## 2019-04-11 DIAGNOSIS — G8929 Other chronic pain: Secondary | ICD-10-CM | POA: Diagnosis not present

## 2019-04-11 DIAGNOSIS — M170 Bilateral primary osteoarthritis of knee: Secondary | ICD-10-CM | POA: Diagnosis not present

## 2019-04-11 DIAGNOSIS — M5440 Lumbago with sciatica, unspecified side: Secondary | ICD-10-CM | POA: Diagnosis not present

## 2019-04-11 DIAGNOSIS — Z9181 History of falling: Secondary | ICD-10-CM | POA: Diagnosis not present

## 2019-04-11 DIAGNOSIS — Z79891 Long term (current) use of opiate analgesic: Secondary | ICD-10-CM | POA: Diagnosis not present

## 2019-04-11 DIAGNOSIS — J45909 Unspecified asthma, uncomplicated: Secondary | ICD-10-CM | POA: Diagnosis not present

## 2019-04-11 DIAGNOSIS — G20 Parkinson's disease: Secondary | ICD-10-CM | POA: Diagnosis not present

## 2019-04-12 ENCOUNTER — Other Ambulatory Visit: Payer: Self-pay | Admitting: Internal Medicine

## 2019-04-12 ENCOUNTER — Other Ambulatory Visit: Payer: Self-pay

## 2019-04-12 NOTE — Telephone Encounter (Signed)
Okay for DME?

## 2019-04-13 DIAGNOSIS — Z9181 History of falling: Secondary | ICD-10-CM | POA: Diagnosis not present

## 2019-04-13 DIAGNOSIS — M5440 Lumbago with sciatica, unspecified side: Secondary | ICD-10-CM | POA: Diagnosis not present

## 2019-04-13 DIAGNOSIS — J45909 Unspecified asthma, uncomplicated: Secondary | ICD-10-CM | POA: Diagnosis not present

## 2019-04-13 DIAGNOSIS — Z79891 Long term (current) use of opiate analgesic: Secondary | ICD-10-CM | POA: Diagnosis not present

## 2019-04-13 DIAGNOSIS — G20 Parkinson's disease: Secondary | ICD-10-CM | POA: Diagnosis not present

## 2019-04-13 DIAGNOSIS — G8929 Other chronic pain: Secondary | ICD-10-CM | POA: Diagnosis not present

## 2019-04-13 DIAGNOSIS — I10 Essential (primary) hypertension: Secondary | ICD-10-CM | POA: Diagnosis not present

## 2019-04-13 DIAGNOSIS — M170 Bilateral primary osteoarthritis of knee: Secondary | ICD-10-CM | POA: Diagnosis not present

## 2019-04-13 NOTE — Telephone Encounter (Signed)
Okay.  Thanks.

## 2019-04-14 NOTE — Telephone Encounter (Signed)
faxed

## 2019-04-16 ENCOUNTER — Other Ambulatory Visit: Payer: Self-pay

## 2019-04-16 ENCOUNTER — Ambulatory Visit: Payer: Medicare Other | Attending: Internal Medicine

## 2019-04-16 DIAGNOSIS — Z23 Encounter for immunization: Secondary | ICD-10-CM | POA: Insufficient documentation

## 2019-04-16 NOTE — Progress Notes (Signed)
   Covid-19 Vaccination Clinic  Name:  Mallory Klein    MRN: [id number] DOB: Apr 24, 1945  04/16/2019  [name] was observed post Covid-19 immunization for 15 minutes without incidence. She was provided with Vaccine Information Sheet and instruction to access the V-Safe system.   [name] was instructed to call 911 with any severe reactions post vaccine: [name] Difficulty breathing  . Swelling of your face and throat  . A fast heartbeat  . A bad rash all over your body  . Dizziness and weakness    Immunizations Administered    Name Date Dose VIS Date Route   Pfizer COVID-19 Vaccine 04/16/2019 12:32 PM 0.3 mL 02/03/2019 Intramuscular   Manufacturer: [place]   Lot: [id number]   [place]: [id number]

## 2019-04-17 NOTE — Telephone Encounter (Signed)
   Patient calling to check the status of message/ refill request Advised patient to allow 48-72 hours

## 2019-04-18 DIAGNOSIS — Z79891 Long term (current) use of opiate analgesic: Secondary | ICD-10-CM | POA: Diagnosis not present

## 2019-04-18 DIAGNOSIS — I10 Essential (primary) hypertension: Secondary | ICD-10-CM | POA: Diagnosis not present

## 2019-04-18 DIAGNOSIS — Z9181 History of falling: Secondary | ICD-10-CM | POA: Diagnosis not present

## 2019-04-18 DIAGNOSIS — M170 Bilateral primary osteoarthritis of knee: Secondary | ICD-10-CM | POA: Diagnosis not present

## 2019-04-18 DIAGNOSIS — M5440 Lumbago with sciatica, unspecified side: Secondary | ICD-10-CM | POA: Diagnosis not present

## 2019-04-18 DIAGNOSIS — F419 Anxiety disorder, unspecified: Secondary | ICD-10-CM

## 2019-04-18 DIAGNOSIS — G8929 Other chronic pain: Secondary | ICD-10-CM | POA: Diagnosis not present

## 2019-04-18 DIAGNOSIS — J45909 Unspecified asthma, uncomplicated: Secondary | ICD-10-CM | POA: Diagnosis not present

## 2019-04-18 DIAGNOSIS — G20 Parkinson's disease: Secondary | ICD-10-CM | POA: Diagnosis not present

## 2019-04-18 NOTE — Progress Notes (Signed)
Mallory Klein was seen today in follow up for Parkinsons disease.  My previous records were reviewed prior to todays visit as well as outside records available to me.  We increased her medication last visit, and talked about the importance of waking up at a fairly normal time (10am).  She admits that she isn't waking up as we discussed - she doesn't wake up until noon.  She states that she cannot sleep and so she take sleep medications that make her sleep too long.  Therefore she only takes medication bid and doesn't think that it helps.  pt denies falls.  Pt denies lightheadedness, near syncope.  No hallucinations.  In home PT with [facility].  She does think that has helped a lot.  She had neurocognitive testing on December 16 with [name].  I have reviewed those results.  There was evidence only of mild cognitive impairment.  On aricept by PCP  Current prescribed movement disorder medications: Carbidopa/levodopa 25/100 CR, 2 tablets at 10 AM/2 tablets at 2 PM/1 tablet at 6 PM (increased last visit)  Previous meds:  Carbidopa/levodopa IR (nausea)  ALLERGIES:   Allergies  Allergen Reactions  . Ace Inhibitors     Patient doesn't recall reaction.  jkl  . Benicar [Olmesartan]     It made her sick  . Aspirin Other (See Comments)    bruising  . Citalopram Hydrobromide Diarrhea    CURRENT MEDICATIONS:  Outpatient Encounter Medications as of 04/20/2019  Medication Sig  . Acetaminophen-Codeine (TYLENOL/CODEINE #3) 300-30 MG tablet Take 0.5-1 tablets by mouth every 6 (six) hours as needed for pain.  [name] albuterol (PROVENTIL HFA;VENTOLIN HFA) 108 (90 Base) MCG/ACT inhaler Inhale 2 puffs into the lungs every 6 (six) hours as needed for wheezing or shortness of breath.  . buprenorphine (BUTRANS) 5 MCG/HR PTWK 1 patch once a week.  . Carbidopa-Levodopa ER (SINEMET CR) 25-100 MG tablet controlled release Take 2 tablets at 10AM, 2 at 1PM, 1 at 6PM, 2 at 10PM  . cetirizine (ZYRTEC) 10 MG tablet  Take 1 tablet (10 mg total) by mouth daily.  . Cholecalciferol (VITAMIN D3) 50 MCG (2000 UT) capsule Take 1 capsule (2,000 Units total) by mouth daily.  . diclofenac Sodium (VOLTAREN) 1 % GEL Apply 4 g topically daily.  [name] donepezil (ARICEPT) 5 MG tablet Take 1 tablet (5 mg total) by mouth at bedtime.  . IRON PO Take 1 tablet by mouth daily.  . montelukast (SINGULAIR) 10 MG tablet Take 1 tablet (10 mg total) by mouth daily.  . primidone (MYSOLINE) 50 MG tablet Take 1 tablet (50 mg total) by mouth 4 (four) times daily.  . temazepam (RESTORIL) 15 MG capsule Take 1-2 capsules (15-30 mg total) by mouth at bedtime as needed for sleep.  [name] venlafaxine XR (EFFEXOR-XR) 150 MG 24 hr capsule TAKE 1 CAPSULE BY MOUTH EVERY DAY WITH BREAKFAST  . verapamil (VERELAN PM) 120 MG 24 hr capsule TAKE 1 CAPSULE (120 MG TOTAL) BY MOUTH AT BEDTIME.  . carbidopa-levodopa (SINEMET CR) 50-200 MG tablet Take 1 tablet by mouth at bedtime.  . [DISCONTINUED] acetaminophen-codeine (TYLENOL #3) 300-30 MG tablet TAKE 1/2 TO 1 TABLETS BY MOUTH EVERY 6 HOURS AS NEEDED FOR PAIN.  . [DISCONTINUED] Acetaminophen-Codeine (TYLENOL/CODEINE #3) 300-30 MG tablet Take 0.5-1 tablets by mouth every 6 (six) hours as needed for pain.  . [DISCONTINUED] gabapentin (NEURONTIN) 300 MG capsule TAKE 1 CAPSULE BY MOUTH THREE TIMES A DAY (Patient not taking: Reported on 04/20/2019)  No facility-administered encounter medications on file as of 04/20/2019.    PHYSICAL EXAMINATION:    VITALS:   Vitals:   04/20/19 1437  BP: (!) 143/79  Pulse: 84  SpO2: 94%  Weight: 176 lb (79.8 kg)  Height: 5\' 1"  (1.549 m)    GEN:  The patient appears stated age and is in NAD. HEENT:  Normocephalic, atraumatic.  The mucous membranes are moist. The superficial temporal arteries are without ropiness or tenderness. CV:  RRR Lungs:  CTAB Neck/HEME:  There are no carotid bruits bilaterally.  Neurological examination:  Orientation: The patient is alert and  oriented x3. Cranial nerves: There is good facial symmetry with facial hypomimia. The speech is fluent and clear. Soft palate rises symmetrically and there is no tongue deviation. Hearing is intact to conversational tone. Sensation: Sensation is intact to light touch throughout Motor: Strength is at least antigravity x4.  Movement examination:  Abnormal movements: there is bilateral LE and bilateral UE rest tremor (all mild) Coordination:  There is  decremation with RAM's    ASSESSMENT/PLAN:  1.  Parkinsons Disease  -Stressed again the importance of compliance.  We talked about taking medications regularly.  We have discussed this every visit.  We discussed that she needs to get out of bed by 10 AM.  Discussed that this does not work if she is taking it only bid.    -Take carbidopa/levodopa 25/100 CR, 2 tablets at 10 AM/2 tablets at 2 PM/1 tablet at 6 PM   -d/c primidone - will wean off (I wasn't one to start - she states that even though she is RX 4 per day she only takes it bid)  -add carbidopa/levodopa 50/200 at bedtime due to q hs tremor 2.  Low back pain  -Follows with orthopedics at [facility].  RBD  -Is already on high-dose temazepam at night.  This is causing hangover effect during the day, so she is sleeping until noon.  Would like to see her off of this. 4.  Mild cognitive impairment  -Had neurocognitive testing with [name] in December, 2020.  No evidence of dementia.  -Primary care has patient on donepezil.  Total time spent on today's visit was 30 minutes, including both face-to-face time and nonface-to-face time.  Time included that spent on review of records (prior notes available to me/labs/imaging if pertinent), discussing treatment and goals, answering patient's questions and coordinating care.  Cc:  Plotnikov, Evie Lacks, MD

## 2019-04-19 MED ORDER — ACETAMINOPHEN-CODEINE 300-30 MG PO TABS: 0.5000 | ORAL_TABLET | Freq: Four times a day (QID) | ORAL | 0 refills | Status: DC | PRN

## 2019-04-20 ENCOUNTER — Other Ambulatory Visit: Payer: Self-pay

## 2019-04-20 ENCOUNTER — Encounter: Payer: Self-pay | Admitting: Neurology

## 2019-04-20 ENCOUNTER — Ambulatory Visit: Payer: Medicare Other | Admitting: Neurology

## 2019-04-20 ENCOUNTER — Telehealth: Payer: Self-pay

## 2019-04-20 VITALS — BP 143/79 | HR 84 | Ht 61.0 in | Wt 176.0 lb

## 2019-04-20 DIAGNOSIS — G20 Parkinson's disease: Secondary | ICD-10-CM | POA: Diagnosis not present

## 2019-04-20 DIAGNOSIS — G3184 Mild cognitive impairment, so stated: Secondary | ICD-10-CM | POA: Diagnosis not present

## 2019-04-20 DIAGNOSIS — G4752 REM sleep behavior disorder: Secondary | ICD-10-CM | POA: Diagnosis not present

## 2019-04-20 MED ORDER — CARBIDOPA-LEVODOPA ER 50-200 MG PO TBCR: 1.0000 | EXTENDED_RELEASE_TABLET | Freq: Every day | ORAL | 1 refills | Status: DC

## 2019-04-20 NOTE — Telephone Encounter (Signed)
Debbie from [place] called in wanting to see if Dr would sign off on the standard written note for patients knee brace. Also needs the office notes that supports knee brace and hill lift. Jackelyn Poling says this information was faxed over to [place] on 04/18/2019. Also says she trying to get patient seen by their provider when he comes to town this coming Tuesday 04/25/2019    Please call and advise

## 2019-04-20 NOTE — Patient Instructions (Addendum)
1.  Decrease primidone to 50 mg , 1 tablet daily for 1 week and then stop the primidone 2.  Add carbidopa/levodopa CR 50 /200 at bedtime 3.  Take carbidopa/levodopa 25/100 CR, 2 tablets at 10 AM/2 tablets at 2 PM/1 tablet at 6 PM (this is not the same as the new carbidopa/levodopa for the bedtime)

## 2019-04-21 NOTE — Telephone Encounter (Signed)
Spoke with pt and she stated it was [facility] that noticed her issue and referred her there. I left a message with [facility] to notify them and see if there is anything else that they need

## 2019-04-21 NOTE — Telephone Encounter (Signed)
Patient returning call to [facility]. Please advise.

## 2019-04-21 NOTE — Telephone Encounter (Signed)
LM for pt to call back.

## 2019-04-25 DIAGNOSIS — G894 Chronic pain syndrome: Secondary | ICD-10-CM | POA: Diagnosis not present

## 2019-04-25 DIAGNOSIS — M47816 Spondylosis without myelopathy or radiculopathy, lumbar region: Secondary | ICD-10-CM | POA: Diagnosis not present

## 2019-04-25 DIAGNOSIS — M1712 Unilateral primary osteoarthritis, left knee: Secondary | ICD-10-CM | POA: Diagnosis not present

## 2019-04-25 DIAGNOSIS — M48061 Spinal stenosis, lumbar region without neurogenic claudication: Secondary | ICD-10-CM | POA: Diagnosis not present

## 2019-04-26 DIAGNOSIS — M5440 Lumbago with sciatica, unspecified side: Secondary | ICD-10-CM | POA: Diagnosis not present

## 2019-04-26 DIAGNOSIS — G8929 Other chronic pain: Secondary | ICD-10-CM | POA: Diagnosis not present

## 2019-04-26 DIAGNOSIS — I10 Essential (primary) hypertension: Secondary | ICD-10-CM | POA: Diagnosis not present

## 2019-04-26 DIAGNOSIS — J45909 Unspecified asthma, uncomplicated: Secondary | ICD-10-CM | POA: Diagnosis not present

## 2019-04-26 DIAGNOSIS — G20 Parkinson's disease: Secondary | ICD-10-CM | POA: Diagnosis not present

## 2019-04-26 DIAGNOSIS — M170 Bilateral primary osteoarthritis of knee: Secondary | ICD-10-CM | POA: Diagnosis not present

## 2019-04-26 DIAGNOSIS — Z79891 Long term (current) use of opiate analgesic: Secondary | ICD-10-CM | POA: Diagnosis not present

## 2019-04-26 DIAGNOSIS — Z9181 History of falling: Secondary | ICD-10-CM | POA: Diagnosis not present

## 2019-04-28 DIAGNOSIS — I10 Essential (primary) hypertension: Secondary | ICD-10-CM | POA: Diagnosis not present

## 2019-04-28 DIAGNOSIS — G8929 Other chronic pain: Secondary | ICD-10-CM | POA: Diagnosis not present

## 2019-04-28 DIAGNOSIS — G20 Parkinson's disease: Secondary | ICD-10-CM | POA: Diagnosis not present

## 2019-04-28 DIAGNOSIS — Z79891 Long term (current) use of opiate analgesic: Secondary | ICD-10-CM | POA: Diagnosis not present

## 2019-04-28 DIAGNOSIS — M5440 Lumbago with sciatica, unspecified side: Secondary | ICD-10-CM | POA: Diagnosis not present

## 2019-04-28 DIAGNOSIS — Z9181 History of falling: Secondary | ICD-10-CM | POA: Diagnosis not present

## 2019-04-28 DIAGNOSIS — M170 Bilateral primary osteoarthritis of knee: Secondary | ICD-10-CM | POA: Diagnosis not present

## 2019-04-28 DIAGNOSIS — J45909 Unspecified asthma, uncomplicated: Secondary | ICD-10-CM | POA: Diagnosis not present

## 2019-05-01 DIAGNOSIS — M25562 Pain in left knee: Secondary | ICD-10-CM | POA: Diagnosis not present

## 2019-05-01 DIAGNOSIS — M8949 Other hypertrophic osteoarthropathy, multiple sites: Secondary | ICD-10-CM | POA: Diagnosis not present

## 2019-05-03 DIAGNOSIS — G8929 Other chronic pain: Secondary | ICD-10-CM | POA: Diagnosis not present

## 2019-05-03 DIAGNOSIS — Z9181 History of falling: Secondary | ICD-10-CM | POA: Diagnosis not present

## 2019-05-03 DIAGNOSIS — I10 Essential (primary) hypertension: Secondary | ICD-10-CM | POA: Diagnosis not present

## 2019-05-03 DIAGNOSIS — Z79891 Long term (current) use of opiate analgesic: Secondary | ICD-10-CM | POA: Diagnosis not present

## 2019-05-03 DIAGNOSIS — M5440 Lumbago with sciatica, unspecified side: Secondary | ICD-10-CM | POA: Diagnosis not present

## 2019-05-03 DIAGNOSIS — J45909 Unspecified asthma, uncomplicated: Secondary | ICD-10-CM | POA: Diagnosis not present

## 2019-05-03 DIAGNOSIS — M170 Bilateral primary osteoarthritis of knee: Secondary | ICD-10-CM | POA: Diagnosis not present

## 2019-05-03 DIAGNOSIS — G20 Parkinson's disease: Secondary | ICD-10-CM | POA: Diagnosis not present

## 2019-05-04 DIAGNOSIS — J45909 Unspecified asthma, uncomplicated: Secondary | ICD-10-CM | POA: Diagnosis not present

## 2019-05-04 DIAGNOSIS — G20 Parkinson's disease: Secondary | ICD-10-CM | POA: Diagnosis not present

## 2019-05-04 DIAGNOSIS — M5440 Lumbago with sciatica, unspecified side: Secondary | ICD-10-CM | POA: Diagnosis not present

## 2019-05-04 DIAGNOSIS — Z9181 History of falling: Secondary | ICD-10-CM | POA: Diagnosis not present

## 2019-05-04 DIAGNOSIS — Z79891 Long term (current) use of opiate analgesic: Secondary | ICD-10-CM | POA: Diagnosis not present

## 2019-05-04 DIAGNOSIS — I10 Essential (primary) hypertension: Secondary | ICD-10-CM | POA: Diagnosis not present

## 2019-05-04 DIAGNOSIS — G8929 Other chronic pain: Secondary | ICD-10-CM | POA: Diagnosis not present

## 2019-05-04 DIAGNOSIS — M170 Bilateral primary osteoarthritis of knee: Secondary | ICD-10-CM | POA: Diagnosis not present

## 2019-05-10 ENCOUNTER — Ambulatory Visit: Payer: Medicare Other | Attending: Family Medicine

## 2019-05-10 DIAGNOSIS — Z79891 Long term (current) use of opiate analgesic: Secondary | ICD-10-CM | POA: Diagnosis not present

## 2019-05-10 DIAGNOSIS — Z23 Encounter for immunization: Secondary | ICD-10-CM

## 2019-05-10 DIAGNOSIS — I10 Essential (primary) hypertension: Secondary | ICD-10-CM | POA: Diagnosis not present

## 2019-05-10 DIAGNOSIS — Z9181 History of falling: Secondary | ICD-10-CM | POA: Diagnosis not present

## 2019-05-10 DIAGNOSIS — J45909 Unspecified asthma, uncomplicated: Secondary | ICD-10-CM | POA: Diagnosis not present

## 2019-05-10 DIAGNOSIS — G8929 Other chronic pain: Secondary | ICD-10-CM | POA: Diagnosis not present

## 2019-05-10 DIAGNOSIS — M5440 Lumbago with sciatica, unspecified side: Secondary | ICD-10-CM | POA: Diagnosis not present

## 2019-05-10 DIAGNOSIS — G20 Parkinson's disease: Secondary | ICD-10-CM | POA: Diagnosis not present

## 2019-05-10 DIAGNOSIS — M170 Bilateral primary osteoarthritis of knee: Secondary | ICD-10-CM | POA: Diagnosis not present

## 2019-05-10 NOTE — Progress Notes (Signed)
   Covid-19 Vaccination Clinic  Name:  Mallory Klein    MRN: [id number] DOB: 09/06/1945  05/10/2019  [name] was observed post Covid-19 immunization for 15 minutes without incident. She was provided with Vaccine Information Sheet and instruction to access the V-Safe system.   [name] was instructed to call 911 with any severe reactions post vaccine: [name] Difficulty breathing  . Swelling of face and throat  . A fast heartbeat  . A bad rash all over body  . Dizziness and weakness   Immunizations Administered    Name Date Dose VIS Date Route   Pfizer COVID-19 Vaccine 05/10/2019 12:41 PM 0.3 mL 02/03/2019 Intramuscular   Manufacturer: [place]   Lot: [id number]   [place]: [id number]

## 2019-05-12 DIAGNOSIS — Z79891 Long term (current) use of opiate analgesic: Secondary | ICD-10-CM | POA: Diagnosis not present

## 2019-05-12 DIAGNOSIS — M5440 Lumbago with sciatica, unspecified side: Secondary | ICD-10-CM | POA: Diagnosis not present

## 2019-05-12 DIAGNOSIS — M170 Bilateral primary osteoarthritis of knee: Secondary | ICD-10-CM | POA: Diagnosis not present

## 2019-05-12 DIAGNOSIS — G20 Parkinson's disease: Secondary | ICD-10-CM | POA: Diagnosis not present

## 2019-05-12 DIAGNOSIS — Z9181 History of falling: Secondary | ICD-10-CM | POA: Diagnosis not present

## 2019-05-12 DIAGNOSIS — J45909 Unspecified asthma, uncomplicated: Secondary | ICD-10-CM | POA: Diagnosis not present

## 2019-05-12 DIAGNOSIS — G8929 Other chronic pain: Secondary | ICD-10-CM | POA: Diagnosis not present

## 2019-05-12 DIAGNOSIS — I10 Essential (primary) hypertension: Secondary | ICD-10-CM | POA: Diagnosis not present

## 2019-05-25 DIAGNOSIS — M48061 Spinal stenosis, lumbar region without neurogenic claudication: Secondary | ICD-10-CM | POA: Diagnosis not present

## 2019-05-25 DIAGNOSIS — M47816 Spondylosis without myelopathy or radiculopathy, lumbar region: Secondary | ICD-10-CM | POA: Diagnosis not present

## 2019-05-25 DIAGNOSIS — G894 Chronic pain syndrome: Secondary | ICD-10-CM | POA: Diagnosis not present

## 2019-05-25 DIAGNOSIS — M1712 Unilateral primary osteoarthritis, left knee: Secondary | ICD-10-CM | POA: Diagnosis not present

## 2019-05-28 ENCOUNTER — Other Ambulatory Visit: Payer: Self-pay | Admitting: Internal Medicine

## 2019-05-30 ENCOUNTER — Telehealth: Payer: Self-pay | Admitting: Neurology

## 2019-05-30 NOTE — Telephone Encounter (Signed)
Patient states that she needs to know which dosage of medication she needs to refill for the Carbidopa levodopa.   She states she has one for  25/100 and one for 50/200 please call

## 2019-05-31 MED ORDER — CARBIDOPA-LEVODOPA ER 25-100 MG PO TBCR: EXTENDED_RELEASE_TABLET | ORAL | 1 refills | Status: DC

## 2019-05-31 MED ORDER — CARBIDOPA-LEVODOPA ER 50-200 MG PO TBCR: 1.0000 | EXTENDED_RELEASE_TABLET | Freq: Every day | ORAL | 1 refills | Status: DC

## 2019-05-31 NOTE — Telephone Encounter (Signed)
What has she been doing since Feb when I changed her meds??  Her instructions were on her AVS.  She is to be on both as follows:   Take carbidopa/levodopa CR 50 /200 at bedtime 2.  Take carbidopa/levodopa 25/100 CR, 2 tablets at 10 AM/2 tablets at 2 PM/1 tablet at 6 PM

## 2019-05-31 NOTE — Telephone Encounter (Signed)
Clarified medication directions for patient.  New orders placed.

## 2019-06-05 ENCOUNTER — Telehealth: Payer: Self-pay

## 2019-06-05 NOTE — Telephone Encounter (Signed)
Pt called and informed the importance of following the schedule she is given and taking meds regularly. Taking carbidopa/levodopa with carbohydrate can help nausea. Advised best to set up an appointment to go over all of her questions pt stated she would look at her calender and call back

## 2019-06-05 NOTE — Telephone Encounter (Signed)
Pt called and went over how to take her medication  1.  Take carbidopa/levodopaCR 50 /200 at bedtime 2.  Take carbidopa/levodopa 25/100CR, 2 tablets at 10 AM/2 tablets at 2 PM/1 tablet at 6 PM.  Pt has many concerns 1. She is hearing a shuffle sound in her head, at 1st it was 2 - 3 times a day now it is more often? 2. She is seeing spots and thinking it is a bug trying to kill something that's not there. 3. Pt stated that her Carbidopa makes her "sick" makes her stomach hurt and makes her nauseous. 4. Pt NEVER takes the 2 tablets at 10 am because she is still sleeping.[name] FYI wanted you to know 5. Pt has only the 50/200 only had them for a while. Stated the others should be at her home today she called them in last week,  6. She would like to try a different medication because she cant stand the thought of taken that medication so she doesn't take it sometimes,

## 2019-06-05 NOTE — Telephone Encounter (Signed)
This is far too much to address through a medical assistant and wouldn't be good for her care.  The most glaring thing that I see here is that she isn't taking her medication as directed (sleeping through one dose, just starting a med that we started months ago) so it would be helpful for her to maintain a regular a schedule.

## 2019-06-12 ENCOUNTER — Encounter: Payer: Self-pay | Admitting: Internal Medicine

## 2019-06-12 ENCOUNTER — Other Ambulatory Visit: Payer: Self-pay

## 2019-06-12 ENCOUNTER — Other Ambulatory Visit (INDEPENDENT_AMBULATORY_CARE_PROVIDER_SITE_OTHER): Payer: Medicare Other

## 2019-06-12 ENCOUNTER — Ambulatory Visit (INDEPENDENT_AMBULATORY_CARE_PROVIDER_SITE_OTHER): Payer: Medicare Other | Admitting: Internal Medicine

## 2019-06-12 VITALS — BP 130/78 | HR 84 | Temp 98.6°F | Ht 61.0 in | Wt 170.0 lb

## 2019-06-12 DIAGNOSIS — R202 Paresthesia of skin: Secondary | ICD-10-CM | POA: Diagnosis not present

## 2019-06-12 DIAGNOSIS — G3184 Mild cognitive impairment, so stated: Secondary | ICD-10-CM

## 2019-06-12 DIAGNOSIS — D500 Iron deficiency anemia secondary to blood loss (chronic): Secondary | ICD-10-CM | POA: Diagnosis not present

## 2019-06-12 DIAGNOSIS — G8929 Other chronic pain: Secondary | ICD-10-CM | POA: Diagnosis not present

## 2019-06-12 DIAGNOSIS — M5440 Lumbago with sciatica, unspecified side: Secondary | ICD-10-CM

## 2019-06-12 DIAGNOSIS — E559 Vitamin D deficiency, unspecified: Secondary | ICD-10-CM

## 2019-06-12 DIAGNOSIS — G20 Parkinson's disease: Secondary | ICD-10-CM | POA: Diagnosis not present

## 2019-06-12 MED ORDER — TEMAZEPAM 15 MG PO CAPS: 15.0000 mg | ORAL_CAPSULE | Freq: Every evening | ORAL | 3 refills | Status: DC | PRN

## 2019-06-12 MED ORDER — HYDROXYZINE HCL 25 MG PO TABS: 25.0000 mg | ORAL_TABLET | Freq: Three times a day (TID) | ORAL | 0 refills | Status: DC | PRN

## 2019-06-12 MED ORDER — ALBUTEROL SULFATE HFA 108 (90 BASE) MCG/ACT IN AERS: 2.0000 | INHALATION_SPRAY | Freq: Four times a day (QID) | RESPIRATORY_TRACT | 11 refills | Status: DC | PRN

## 2019-06-12 NOTE — Assessment & Plan Note (Signed)
F/u w/dr Tat 

## 2019-06-12 NOTE — Progress Notes (Signed)
Subjective:  Patient ID: Mallory Klein, female    DOB: 10-03-1945  Age: 74 y.o. MRN: [id number]  CC: No chief complaint on file.   HPI Mallory Klein presents for chronic pain, tremor, asthma f/u On hydroxyzine for itching from the Butrans patch  - seeing Dr Greta Doom   Outpatient Medications Prior to Visit  Medication Sig Dispense Refill  . Acetaminophen-Codeine (TYLENOL/CODEINE #3) 300-30 MG tablet Take 0.5-1 tablets by mouth every 6 (six) hours as needed for pain. 20 tablet 0  . albuterol (PROVENTIL HFA;VENTOLIN HFA) 108 (90 Base) MCG/ACT inhaler Inhale 2 puffs into the lungs every 6 (six) hours as needed for wheezing or shortness of breath. 1 Inhaler 6  . buprenorphine (BUTRANS) 5 MCG/HR PTWK 1 patch once a week.    . carbidopa-levodopa (SINEMET CR) 50-200 MG tablet Take 1 tablet by mouth at bedtime. 90 tablet 1  . Carbidopa-Levodopa ER (SINEMET CR) 25-100 MG tablet controlled release Take 2 tablets at 10AM, 2 at 2PM, 1 at 6PM 450 tablet 1  . cetirizine (ZYRTEC) 10 MG tablet Take 1 tablet (10 mg total) by mouth daily. 90 tablet 3  . Cholecalciferol (VITAMIN D3) 50 MCG (2000 UT) capsule Take 1 capsule (2,000 Units total) by mouth daily. 100 capsule 3  . diclofenac Sodium (VOLTAREN) 1 % GEL Apply 4 g topically daily.    [name] donepezil (ARICEPT) 5 MG tablet TAKE 1 TABLET BY MOUTH AT  BEDTIME 90 tablet 3  . IRON PO Take 1 tablet by mouth daily.    . montelukast (SINGULAIR) 10 MG tablet Take 1 tablet (10 mg total) by mouth daily. 90 tablet 3  . temazepam (RESTORIL) 15 MG capsule Take 1-2 capsules (15-30 mg total) by mouth at bedtime as needed for sleep. 60 capsule 3  . venlafaxine XR (EFFEXOR-XR) 150 MG 24 hr capsule TAKE 1 CAPSULE BY MOUTH  EVERY DAY WITH BREAKFAST 90 capsule 3  . verapamil (VERELAN PM) 120 MG 24 hr capsule TAKE 1 CAPSULE (120 MG TOTAL) BY MOUTH AT BEDTIME. 90 capsule 3  . primidone (MYSOLINE) 50 MG tablet Take 1 tablet (50 mg total) by mouth 4 (four) times daily.  (Patient not taking: Reported on 06/12/2019) 90 tablet 5   No facility-administered medications prior to visit.    ROS: Review of Systems  Constitutional: Positive for fatigue. Negative for activity change, appetite change, chills and unexpected weight change.  HENT: Negative for congestion, mouth sores and sinus pressure.   Eyes: Negative for visual disturbance.  Respiratory: Negative for cough and chest tightness.   Gastrointestinal: Negative for abdominal pain and nausea.  Genitourinary: Negative for difficulty urinating, frequency and vaginal pain.  Musculoskeletal: Positive for back pain and gait problem.  Skin: Negative for pallor and rash.  Neurological: Positive for tremors. Negative for dizziness, weakness, numbness and headaches.  Psychiatric/Behavioral: Negative for confusion and sleep disturbance.    Objective:  BP 130/78 (BP Location: Left Arm, Patient Position: Sitting, Cuff Size: Large)   Pulse 84   Temp 98.6 F (37 C) (Oral)   Ht 5\' 1"  (1.549 m)   Wt 170 lb (77.1 kg)   SpO2 96%   BMI 32.12 kg/m   BP Readings from Last 3 Encounters:  06/12/19 130/78  04/20/19 (!) 143/79  12/08/18 114/78    Wt Readings from Last 3 Encounters:  06/12/19 170 lb (77.1 kg)  04/20/19 176 lb (79.8 kg)  12/08/18 176 lb (79.8 kg)    Physical Exam Constitutional:  General: She is not in acute distress.    Appearance: She is well-developed.  HENT:     Head: Normocephalic.     Right Ear: External ear normal.     Left Ear: External ear normal.     Nose: Nose normal.  Eyes:     General:        Right eye: No discharge.        Left eye: No discharge.     Conjunctiva/sclera: Conjunctivae normal.     Pupils: Pupils are equal, round, and reactive to light.  Neck:     Thyroid: No thyromegaly.     Vascular: No JVD.     Trachea: No tracheal deviation.  Cardiovascular:     Rate and Rhythm: Normal rate and regular rhythm.     Heart sounds: Normal heart sounds.  Pulmonary:      Effort: No respiratory distress.     Breath sounds: No stridor. No wheezing.  Abdominal:     General: Bowel sounds are normal. There is no distension.     Palpations: Abdomen is soft. There is no mass.     Tenderness: There is no abdominal tenderness. There is no guarding or rebound.  Musculoskeletal:        General: No tenderness.     Cervical back: Normal range of motion and neck supple.  Lymphadenopathy:     Cervical: No cervical adenopathy.  Skin:    Findings: No erythema or rash.  Neurological:     Mental Status: She is oriented to person, place, and time.     Cranial Nerves: No cranial nerve deficit.     Motor: Weakness present. No abnormal muscle tone.     Coordination: Coordination abnormal.     Gait: Gait abnormal.     Deep Tendon Reflexes: Reflexes normal.  Psychiatric:        Behavior: Behavior normal.        Thought Content: Thought content normal.        Judgment: Judgment normal.   head, hands tremor RLE shorter  Lab Results  Component Value Date   WBC 11.1 (H) 12/08/2018   HGB 14.0 12/08/2018   HCT 43.5 12/08/2018   PLT 389 12/08/2018   GLUCOSE 101 (H) 02/28/2018   CHOL 219 (H) 02/05/2015   TRIG 69.0 02/05/2015   HDL 73.70 02/05/2015   LDLDIRECT 109.8 02/10/2010   LDLCALC 132 (H) 02/05/2015   ALT 13 02/28/2018   AST 13 (L) 02/28/2018   NA 142 02/28/2018   K 4.2 02/28/2018   CL 104 02/28/2018   CREATININE 0.88 02/28/2018   BUN 21 02/28/2018   CO2 31 02/28/2018   TSH 0.82 02/12/2017   INR 0.97 02/07/2014   HGBA1C 6.2 02/12/2017    CT Abdomen Pelvis W Contrast  Result Date: 02/28/2018 CLINICAL DATA:  Abdominal pain EXAM: CT ABDOMEN AND PELVIS WITH CONTRAST TECHNIQUE: Multidetector CT imaging of the abdomen and pelvis was performed using the standard protocol following bolus administration of intravenous contrast. CONTRAST:  13mL ISOVUE-300 IOPAMIDOL (ISOVUE-300) INJECTION 61% COMPARISON:  None. FINDINGS: LOWER CHEST: There is no basilar pleural or  apical pericardial effusion. HEPATOBILIARY: The hepatic contours and density are normal. There is no intra- or extrahepatic biliary dilatation. The gallbladder is normal. PANCREAS: The pancreatic parenchymal contours are normal and there is no ductal dilatation. There is no peripancreatic fluid collection. SPLEEN: Normal. ADRENALS/URINARY TRACT: --Adrenal glands: Normal. --Right kidney/ureter: No hydronephrosis, nephroureterolithiasis, perinephric stranding or solid renal mass. --Left kidney/ureter: No hydronephrosis,  nephroureterolithiasis, perinephric stranding or solid renal mass. --Urinary bladder: Normal for degree of distention STOMACH/BOWEL: --Stomach/Duodenum: There is no hiatal hernia or other gastric abnormality. The duodenal course and caliber are normal. --Small bowel: No dilatation or inflammation. --Colon: No focal abnormality. --Appendix: Normal. VASCULAR/LYMPHATIC: There is aortic atherosclerosis without hemodynamically significant stenosis. The portal vein, splenic vein, superior mesenteric vein and IVC are patent. No abdominal or pelvic lymphadenopathy. REPRODUCTIVE: Status post hysterectomy. No adnexal mass. MUSCULOSKELETAL. Multilevel degenerative disc disease and facet arthrosis. No bony spinal canal stenosis. OTHER: None. IMPRESSION: No acute abdominal or pelvic abnormality. Electronically Signed   By: Ulyses Jarred M.D.   On: 02/28/2018 18:50    Assessment & Plan:   There are no diagnoses linked to this encounter.   No orders of the defined types were placed in this encounter.    Follow-up: No follow-ups on file.  Walker Kehr, MD

## 2019-06-12 NOTE — Assessment & Plan Note (Signed)
Parkinson's: on Sinmet IR - - f/u w/Dr Tat

## 2019-06-12 NOTE — Assessment & Plan Note (Signed)
On hydroxyzine for itching from the Butrans patch  - seeing Dr Greta Doom, [facility]

## 2019-06-12 NOTE — Assessment & Plan Note (Signed)
F/u w/Dr Feng 

## 2019-06-13 LAB — BASIC METABOLIC PANEL
BUN: 19 mg/dL (ref 6–23)
CO2: 31 mEq/L (ref 19–32)
Calcium: 9.3 mg/dL (ref 8.4–10.5)
Chloride: 103 mEq/L (ref 96–112)
Creatinine, Ser: 1.01 mg/dL (ref 0.40–1.20)
GFR: 64.92 mL/min (ref >60.00)
Glucose, Bld: 88 mg/dL (ref 70–99)
Potassium: 4.2 mEq/L (ref 3.5–5.1)
Sodium: 141 mEq/L (ref 135–145)

## 2019-06-13 LAB — HEPATIC FUNCTION PANEL
ALT: 3 U/L (ref 0–35)
AST: 14 U/L (ref 0–37)
Albumin: 4.1 g/dL (ref 3.5–5.2)
Alkaline Phosphatase: 66 U/L (ref 39–117)
Bilirubin, Direct: 0.1 mg/dL (ref 0.0–0.3)
Total Bilirubin: 0.4 mg/dL (ref 0.2–1.2)
Total Protein: 7.3 g/dL (ref 6.0–8.3)

## 2019-06-13 LAB — URINALYSIS
Bilirubin Urine: NEGATIVE
Hgb urine dipstick: NEGATIVE
Leukocytes,Ua: NEGATIVE
Nitrite: NEGATIVE
Specific Gravity, Urine: >=1.03 — AB (ref 1.000–1.030)
Total Protein, Urine: NEGATIVE
Urine Glucose: NEGATIVE
Urobilinogen, UA: 0.2 (ref 0.0–1.0)
pH: 5.5 (ref 5.0–8.0)

## 2019-06-13 LAB — VITAMIN D 25 HYDROXY (VIT D DEFICIENCY, FRACTURES): VITD: 32.49 ng/mL (ref 30.00–100.00)

## 2019-06-13 LAB — VITAMIN B12: Vitamin B-12: 168 pg/mL — ABNORMAL LOW (ref 211–911)

## 2019-06-14 ENCOUNTER — Encounter: Payer: Self-pay | Admitting: Internal Medicine

## 2019-06-14 DIAGNOSIS — E538 Deficiency of other specified B group vitamins: Secondary | ICD-10-CM | POA: Insufficient documentation

## 2019-06-16 ENCOUNTER — Ambulatory Visit (INDEPENDENT_AMBULATORY_CARE_PROVIDER_SITE_OTHER): Payer: Medicare Other | Admitting: *Deleted

## 2019-06-16 ENCOUNTER — Other Ambulatory Visit: Payer: Self-pay

## 2019-06-16 DIAGNOSIS — E538 Deficiency of other specified B group vitamins: Secondary | ICD-10-CM | POA: Diagnosis not present

## 2019-06-16 MED ORDER — CYANOCOBALAMIN 1000 MCG/ML IJ SOLN
1000.0000 ug | Freq: Once | INTRAMUSCULAR | Status: AC
  Administered 2019-06-16: 1000 ug via INTRAMUSCULAR

## 2019-06-16 NOTE — Progress Notes (Signed)
Pls cosign for B12 inj../lmb  

## 2019-06-23 ENCOUNTER — Other Ambulatory Visit: Payer: Self-pay

## 2019-06-23 ENCOUNTER — Other Ambulatory Visit: Payer: Self-pay | Admitting: Internal Medicine

## 2019-06-23 ENCOUNTER — Ambulatory Visit (INDEPENDENT_AMBULATORY_CARE_PROVIDER_SITE_OTHER): Payer: Medicare Other | Admitting: *Deleted

## 2019-06-23 DIAGNOSIS — E538 Deficiency of other specified B group vitamins: Secondary | ICD-10-CM

## 2019-06-23 MED ORDER — CYANOCOBALAMIN 1000 MCG/ML IJ SOLN
1000.0000 ug | Freq: Once | INTRAMUSCULAR | Status: AC
  Administered 2019-06-23: 1000 ug via INTRAMUSCULAR

## 2019-06-23 NOTE — Progress Notes (Addendum)
Pls cosign for B12 inj since PCP is out of the office.Marland KitchenJohny Klein

## 2019-06-26 DIAGNOSIS — M47816 Spondylosis without myelopathy or radiculopathy, lumbar region: Secondary | ICD-10-CM | POA: Diagnosis not present

## 2019-06-26 DIAGNOSIS — G894 Chronic pain syndrome: Secondary | ICD-10-CM | POA: Diagnosis not present

## 2019-06-26 DIAGNOSIS — M1712 Unilateral primary osteoarthritis, left knee: Secondary | ICD-10-CM | POA: Diagnosis not present

## 2019-06-26 DIAGNOSIS — M48061 Spinal stenosis, lumbar region without neurogenic claudication: Secondary | ICD-10-CM | POA: Diagnosis not present

## 2019-06-30 ENCOUNTER — Other Ambulatory Visit: Payer: Self-pay

## 2019-06-30 ENCOUNTER — Ambulatory Visit (INDEPENDENT_AMBULATORY_CARE_PROVIDER_SITE_OTHER): Payer: Medicare Other | Admitting: *Deleted

## 2019-06-30 DIAGNOSIS — E538 Deficiency of other specified B group vitamins: Secondary | ICD-10-CM | POA: Diagnosis not present

## 2019-06-30 MED ORDER — CYANOCOBALAMIN 1000 MCG/ML IJ SOLN
1000.0000 ug | Freq: Once | INTRAMUSCULAR | Status: AC
  Administered 2019-06-30: 1000 ug via INTRAMUSCULAR

## 2019-06-30 NOTE — Progress Notes (Signed)
Pls cosign for B12 inj since PCP is out of the office today../lmb 

## 2019-06-30 NOTE — Progress Notes (Signed)
Patient ID: Mallory Klein, female   DOB: April 08, 1945, 74 y.o.   MRN: [id number] Medical treatment/procedure(s) were performed by non-physician practitioner and as supervising physician I was immediately available for consultation/collaboration. I agree with above. Hoyt Koch, MD

## 2019-07-03 NOTE — Progress Notes (Signed)
[place]   [phone] [phone]   Clinic Follow up Note   Patient Care Team: Plotnikov, Evie Lacks, MD as PCP - General Magda Bernheim, MD as Referring Physician (Orthopedic Surgery) Irene Shipper, MD (Gastroenterology) Eustace Moore, MD as Consulting Physician (Neurosurgery) Clydell Hakim, MD as Consulting Physician (Pain Medicine) Truitt Merle, MD as Consulting Physician (Hematology) Alla Feeling, NP as Nurse Practitioner (Nurse Practitioner) Tat, Eustace Quail, DO as Consulting Physician (Neurology) Margaretha Sheffield, MD as Referring Physician (Physical Medicine and Rehabilitation)  Date of Service:  07/05/2019  CHIEF COMPLAINT: F/u of Iron deficiency anemia, thrombocytosis  CURRENT THERAPY:  -Oral iron supplement 1 tab daily started on 03/19/17, increased to 1 tab BID on 04/02/17; s/p IV Feraheme 05/03/17, 05/11/17 -IV B12 injections with her PCP   INTERVAL HISTORY:  Mallory Klein is here for a follow up. She was last seen by me in 03/2018. She presents to the clinic alone. She notes she is doing well. She notes 1 month ago she saw her PCP and had low B12 and would have to start B12 injections. She notes she has received 3 injections so far and her fatigue has improved.  She notes she is on oral iron once daily.    REVIEW OF SYSTEMS:   Constitutional: Denies fevers, chills or abnormal weight loss Eyes: Denies blurriness of vision Ears, nose, mouth, throat, and face: Denies mucositis or sore throat Respiratory: Denies cough, dyspnea or wheezes Cardiovascular: Denies palpitation, chest discomfort or lower extremity swelling Gastrointestinal:  Denies nausea, heartburn or change in bowel habits Skin: Denies abnormal skin rashes Lymphatics: Denies new lymphadenopathy or easy bruising Neurological:Denies numbness, tingling or new weaknesses Behavioral/Psych: Mood is stable, no new changes  All other systems were reviewed with the patient and are  negative.  MEDICAL HISTORY:  Past Medical History:  Diagnosis Date  . Abnormal CBC 02/05/2015   [name] 12/16 new: normocytic anemia and thrombocytosis  . Adjustment disorder with mixed anxiety and depressed mood 02/15/2007   Chronic  Chronic pain Grief, stress Effexor XR  Dad died in Feb 13, 2023  . Asthma   . Diverticulosis of colon (without mention of hemorrhage)   . Esophageal stricture   . Essential hypertension 01/26/2007   Chronic Verapamil   . GERD (gastroesophageal reflux disease) 02/15/2007   Chronic    . Heart murmur   . Hyperglycemia 03/02/2011   Mild    . Insomnia disorder 12/08/2018   10/20 Carbid/Lev dose was increased - c/o hard time falling asleep (6 am) getting up at 12 am, poor sleep. Try Temazepam 15-30 mg at 11-1:30 pm   . Internal hemorrhoids without mention of complication   . Iron deficiency anemia 04/23/2017  . Mild neurocognitive disorder, likely due to Parkinson's disease 02/09/2019  . OSA (obstructive sleep apnea) 02/14/2010   In lab study (May 2018): AHI 25. Only had 45 minutes of sleep secondary to frequent awakenings secondary to sleep apnea. autocpap 5-15 cm water.   . Osteoarthritis   . Parkinson disease ([place]) February 12, 2014   2015 2017  Primidone - d/c 2019 Parkinson's: [facility] IR. Dr Tat  . Postherpetic trigeminal neuralgia 01/26/2007   Qualifier: Diagnosis of  By: Marca Ancona RMA, [name]    . Shortness of breath dyspnea    with allergies  . Thrombocytosis ([place]) 05/24/2017  . Urinary incontinence 01/26/2007   Chronic  10/17 Detrol LA   . Vitamin D deficiency 01/26/2019    SURGICAL HISTORY: Past Surgical History:  Procedure  Laterality Date  . ABDOMINAL HYSTERECTOMY    . BREAST CYST EXCISION Left 1980's  . LUMBAR LAMINECTOMY/DECOMPRESSION MICRODISCECTOMY Bilateral 02/20/2014   Procedure: LUMBAR TWO THREE, LUMBAR THREE FOUR LUMBAR LAMINECTOMY/DECOMPRESSION MICRODISCECTOMY 2 LEVELS;  Surgeon: Eustace Moore, MD;  Location: [facility];  Service: Neurosurgery;   Laterality: Bilateral;  . SPLENECTOMY    . TIBIA FRACTURE SURGERY Right   . TIBIA FRACTURE SURGERY Left   . TOTAL KNEE ARTHROPLASTY Right 2013    I have reviewed the social history and family history with the patient and they are unchanged from previous note.  ALLERGIES:  is allergic to ace inhibitors; benicar [olmesartan]; aspirin; and citalopram hydrobromide.  MEDICATIONS:  Current Outpatient Medications  Medication Sig Dispense Refill  . cyanocobalamin (,VITAMIN B-12,) 1000 MCG/ML injection Inject 1,000 mcg into the muscle once a week.    [name] albuterol (VENTOLIN HFA) 108 (90 Base) MCG/ACT inhaler Inhale 2 puffs into the lungs every 6 (six) hours as needed for wheezing or shortness of breath. 18 g 11  . buprenorphine (BUTRANS) 5 MCG/HR PTWK 1 patch once a week.    . carbidopa-levodopa (SINEMET CR) 50-200 MG tablet Take 1 tablet by mouth at bedtime. 90 tablet 1  . Carbidopa-Levodopa ER (SINEMET CR) 25-100 MG tablet controlled release Take 2 tablets at 10AM, 2 at 2PM, 1 at 6PM 450 tablet 1  . Cholecalciferol (VITAMIN D3) 50 MCG (2000 UT) capsule Take 1 capsule (2,000 Units total) by mouth daily. 100 capsule 3  . diclofenac Sodium (VOLTAREN) 1 % GEL Apply 4 g topically daily.    [name] docusate sodium (COLACE) 100 MG capsule SMARTSIG:1 Tablet(s) By Mouth Twice Daily    . donepezil (ARICEPT) 5 MG tablet TAKE 1 TABLET BY MOUTH AT  BEDTIME 90 tablet 3  . hydrOXYzine (ATARAX/VISTARIL) 25 MG tablet Take 1 tablet (25 mg total) by mouth every 8 (eight) hours as needed for itching (itching from Butrans patch). 60 tablet 0  . hydrOXYzine (VISTARIL) 25 MG capsule Take 25 mg by mouth 3 (three) times daily.    . IRON PO Take 1 tablet by mouth daily.    . montelukast (SINGULAIR) 10 MG tablet Take 1 tablet (10 mg total) by mouth daily. 90 tablet 3  . temazepam (RESTORIL) 15 MG capsule Take 1-2 capsules (15-30 mg total) by mouth at bedtime as needed for sleep. 60 capsule 3  . venlafaxine XR (EFFEXOR-XR) 150 MG  24 hr capsule TAKE 1 CAPSULE BY MOUTH  EVERY DAY WITH BREAKFAST 90 capsule 3  . verapamil (VERELAN PM) 120 MG 24 hr capsule TAKE 1 CAPSULE (120 MG TOTAL) BY MOUTH AT BEDTIME. 90 capsule 3   No current facility-administered medications for this visit.    PHYSICAL EXAMINATION: ECOG PERFORMANCE STATUS: 3 - Symptomatic, >50% confined to bed  Vitals:   07/05/19 1345  BP: (!) 165/84  Pulse: 72  Resp: 18  Temp: 98 F (36.7 C)  SpO2: 98%   Filed Weights   07/05/19 1345  Weight: 170 lb 9.6 oz (77.4 kg)    Due to COVID19 we will limit examination to appearance. Patient had no complaints. GENERAL:alert, no distress and comfortable SKIN: skin color normal, no rashes or significant lesions EYES: normal, Conjunctiva are pink and non-injected, sclera clear  NEURO: alert & oriented x 3 with fluent speech   LABORATORY DATA:  I have reviewed the data as listed CBC Latest Ref Rng & Units 07/05/2019 12/08/2018 08/04/2018  WBC 4.0 - 10.5 K/uL 8.4 11.1(H) 8.4  Hemoglobin 12.0 - 15.0 g/dL 13.3 14.0 14.0  Hematocrit 36.0 - 46.0 % 42.7 43.5 44.3  Platelets 150 - 400 K/uL 362 389 400     CMP Latest Ref Rng & Units 06/12/2019 02/28/2018 02/12/2017  Glucose 70 - 99 mg/dL 88 101(H) 89  BUN 6 - 23 mg/dL '19 21 12  ' Creatinine 0.40 - 1.20 mg/dL 1.01 0.88 0.82  Sodium 135 - 145 mEq/L 141 142 140  Potassium 3.5 - 5.1 mEq/L 4.2 4.2 4.5  Chloride 96 - 112 mEq/L 103 104 101  CO2 19 - 32 mEq/L 31 31 35(H)  Calcium 8.4 - 10.5 mg/dL 9.3 8.7(L) 9.0  Total Protein 6.0 - 8.3 g/dL 7.3 6.8 -  Total Bilirubin 0.2 - 1.2 mg/dL 0.4 0.6 -  Alkaline Phos 39 - 117 U/L 66 49 -  AST 0 - 37 U/L 14 13(L) -  ALT 0 - 35 U/L 3 13 -      RADIOGRAPHIC STUDIES: I have personally reviewed the radiological images as listed and agreed with the findings in the report. No results found.   ASSESSMENT & PLAN:  Mallory Klein is a 74 y.o. female with    1. Anemia, iron deficiency -She presented for chronic anemia for at  least 2 years, and recent moderate thrombocytosis.  -Pt labs from 03/19/17 was consistent with iron deficient anemia. I advised her begin oral iron supplement daily since 02/2017. After 4 weeks. She only required IV Feraheme on 05/03/17 and 05/11/17.  -She responded very well, anemia resolved. She is solely on oral iron once daily.  -Labs reviewed, CBC is normal today, Iron panel still pending. Continue once daily oral iron.  -F/u in 1 year. She will f/u with Dr. Alain Marion in interim.    2. Thrombocytosis, intermittent leukocytosis  -She is S/p splenectomy. Platelets were normal for period of time from 2011 to 2013 but have been consistently elevated since then in 500K range.  -Her smear from 3/19 has moderate target, few teardrop, ovalocytes, and shistocytes, She had a bone marrow biopsy surgical pathology 05/19/17 showed slightly hypocellular marrow, no morphological evidence of MDS. I discussed with Dr. Gari Crown, the morphology of bone marrow was not impressive for MPN, also early stage MPN can not be ruled out given the marked hypercellular marrow.  -BM cytogenetics test isnormal and MPN genetic panel including JAK2 was negative -Thrombocytosis and leukocytosis resolved, likely reactive    3. B12 deficiency  -Her labs with PCP from 06/12/19 shows B12 168.  -She was started on B12 injections in 06/16/19 and has received 3 injections so far and fatigue has improved. She will continue to f/u with her PCP.  -I discussed depending on etiology she may or may nor respond to oral B12. If not responsive she may be on B12 injections indefinitely.  -I will check her Intrinsic today, she is agreeable.    4. HTN, asthma -Continue f/u with PCP Dr. Alain Marion  5. Cancer Screening -her last colonoscopy was 5 years ago with a normal results. -No family history of colon cancer -She went to see a gastroenterologist, Dr. Henrene Pastor, and underwent a screening colonoscopy in February 2020  6. Parkinsons -She saw  a neurologist, [name]  -She is on carbidopa/levodopa and was referred to a Parkinson's program by her neurologist   7. Back Pain -She is seeing [facility] and is considering surgery -She is on injections BDS  Gabapentin -She is also seeing a pain specialist for this as well  PLAN -Check intrinsic factor today  -Continue IV B12 with PCP  -Continue oral iron once daily -Lab and F/u in 1 year.  -Copy note to Dr Alain Marion, I recommend her to check CBC every 4 months at Dr. Judeen Hammans office    No problem-specific Assessment & Plan notes found for this encounter.   Orders Placed This Encounter  Procedures  . Intrinsic factor antibodies    Pleas add to blood draw today)    Standing Status:   Future    Standing Expiration Date:   07/04/2020   All questions were answered. The patient knows to call the clinic with any problems, questions or concerns. No barriers to learning was detected. The total time spent in the appointment was 20 minutes.     Truitt Merle, MD 07/05/2019   I, Joslyn Devon, am acting as scribe for Truitt Merle, MD.   I have reviewed the above documentation for accuracy and completeness, and I agree with the above.

## 2019-07-05 ENCOUNTER — Inpatient Hospital Stay (HOSPITAL_BASED_OUTPATIENT_CLINIC_OR_DEPARTMENT_OTHER): Payer: Medicare Other | Admitting: Hematology

## 2019-07-05 ENCOUNTER — Encounter: Payer: Self-pay | Admitting: Hematology

## 2019-07-05 ENCOUNTER — Inpatient Hospital Stay: Payer: Medicare Other | Attending: Hematology

## 2019-07-05 ENCOUNTER — Other Ambulatory Visit: Payer: Self-pay

## 2019-07-05 VITALS — BP 165/84 | HR 72 | Temp 98.0°F | Resp 18 | Ht 61.0 in | Wt 170.6 lb

## 2019-07-05 DIAGNOSIS — D75839 Thrombocytosis, unspecified: Secondary | ICD-10-CM

## 2019-07-05 DIAGNOSIS — D509 Iron deficiency anemia, unspecified: Secondary | ICD-10-CM | POA: Diagnosis not present

## 2019-07-05 DIAGNOSIS — Z9081 Acquired absence of spleen: Secondary | ICD-10-CM | POA: Insufficient documentation

## 2019-07-05 DIAGNOSIS — I10 Essential (primary) hypertension: Secondary | ICD-10-CM | POA: Diagnosis not present

## 2019-07-05 DIAGNOSIS — J45909 Unspecified asthma, uncomplicated: Secondary | ICD-10-CM | POA: Insufficient documentation

## 2019-07-05 DIAGNOSIS — D508 Other iron deficiency anemias: Secondary | ICD-10-CM

## 2019-07-05 DIAGNOSIS — D72829 Elevated white blood cell count, unspecified: Secondary | ICD-10-CM | POA: Insufficient documentation

## 2019-07-05 DIAGNOSIS — Z886 Allergy status to analgesic agent status: Secondary | ICD-10-CM | POA: Diagnosis not present

## 2019-07-05 DIAGNOSIS — D649 Anemia, unspecified: Secondary | ICD-10-CM

## 2019-07-05 DIAGNOSIS — R5383 Other fatigue: Secondary | ICD-10-CM | POA: Insufficient documentation

## 2019-07-05 DIAGNOSIS — D473 Essential (hemorrhagic) thrombocythemia: Secondary | ICD-10-CM | POA: Diagnosis not present

## 2019-07-05 DIAGNOSIS — Z8719 Personal history of other diseases of the digestive system: Secondary | ICD-10-CM | POA: Diagnosis not present

## 2019-07-05 DIAGNOSIS — M549 Dorsalgia, unspecified: Secondary | ICD-10-CM | POA: Insufficient documentation

## 2019-07-05 DIAGNOSIS — E538 Deficiency of other specified B group vitamins: Secondary | ICD-10-CM | POA: Diagnosis not present

## 2019-07-05 DIAGNOSIS — G20 Parkinson's disease: Secondary | ICD-10-CM | POA: Insufficient documentation

## 2019-07-05 DIAGNOSIS — D500 Iron deficiency anemia secondary to blood loss (chronic): Secondary | ICD-10-CM | POA: Diagnosis not present

## 2019-07-05 DIAGNOSIS — R7989 Other specified abnormal findings of blood chemistry: Secondary | ICD-10-CM | POA: Insufficient documentation

## 2019-07-05 DIAGNOSIS — Z79899 Other long term (current) drug therapy: Secondary | ICD-10-CM | POA: Diagnosis not present

## 2019-07-05 LAB — IRON AND TIBC
Iron: 88 ug/dL (ref 41–142)
Saturation Ratios: 31 % (ref 21–57)
TIBC: 281 ug/dL (ref 236–444)
UIBC: 194 ug/dL (ref 120–384)

## 2019-07-05 LAB — CBC WITH DIFFERENTIAL (CANCER CENTER ONLY)
Abs Immature Granulocytes: 0.02 10*3/uL (ref 0.00–0.07)
Basophils Absolute: 0.1 10*3/uL (ref 0.0–0.1)
Basophils Relative: 1 %
Eosinophils Absolute: 0.3 10*3/uL (ref 0.0–0.5)
Eosinophils Relative: 4 %
HCT: 42.7 % (ref 36.0–46.0)
Hemoglobin: 13.3 g/dL (ref 12.0–15.0)
Immature Granulocytes: 0 %
Lymphocytes Relative: 29 %
Lymphs Abs: 2.5 10*3/uL (ref 0.7–4.0)
MCH: 29.6 pg (ref 26.0–34.0)
MCHC: 31.1 g/dL (ref 30.0–36.0)
MCV: 95.1 fL (ref 80.0–100.0)
Monocytes Absolute: 0.6 10*3/uL (ref 0.1–1.0)
Monocytes Relative: 8 %
Neutro Abs: 4.9 10*3/uL (ref 1.7–7.7)
Neutrophils Relative %: 58 %
Platelet Count: 362 10*3/uL (ref 150–400)
RBC: 4.49 MIL/uL (ref 3.87–5.11)
RDW: 15.8 % — ABNORMAL HIGH (ref 11.5–15.5)
WBC Count: 8.4 10*3/uL (ref 4.0–10.5)
nRBC: 0 % (ref 0.0–0.2)

## 2019-07-05 LAB — RETICULOCYTES
Immature Retic Fract: 16.1 % — ABNORMAL HIGH (ref 2.3–15.9)
RBC.: 4.46 MIL/uL (ref 3.87–5.11)
Retic Count, Absolute: 73.1 10*3/uL (ref 19.0–186.0)
Retic Ct Pct: 1.6 % (ref 0.4–3.1)

## 2019-07-05 LAB — FERRITIN: Ferritin: 36 ng/mL (ref 11–307)

## 2019-07-06 ENCOUNTER — Telehealth: Payer: Self-pay | Admitting: Hematology

## 2019-07-06 NOTE — Telephone Encounter (Signed)
Scheduled appt per 5/12 los.  Pt stated she was leaving, and to go ahead and schedule the appt, and that she will check my-chart.  She stated she will call to get it rescheduled if needed.

## 2019-07-07 ENCOUNTER — Other Ambulatory Visit: Payer: Self-pay

## 2019-07-07 ENCOUNTER — Ambulatory Visit (INDEPENDENT_AMBULATORY_CARE_PROVIDER_SITE_OTHER): Payer: Medicare Other

## 2019-07-07 ENCOUNTER — Telehealth: Payer: Self-pay

## 2019-07-07 DIAGNOSIS — E538 Deficiency of other specified B group vitamins: Secondary | ICD-10-CM

## 2019-07-07 MED ORDER — CYANOCOBALAMIN 1000 MCG/ML IJ SOLN
1000.0000 ug | Freq: Once | INTRAMUSCULAR | Status: AC
  Administered 2019-07-07: 1000 ug via INTRAMUSCULAR

## 2019-07-07 NOTE — Telephone Encounter (Signed)
Please advise 

## 2019-07-07 NOTE — Telephone Encounter (Signed)
Patient came in for b12 injection today. Would like to know how long to continue to come in for injections. Also wants to make PCP aware her tylenol 3 does not help with her pain. Aware he is out of office today and does not expect a response back until Monday.

## 2019-07-07 NOTE — Progress Notes (Signed)
b12 Injection given.   Mallory Klein [name], MD  

## 2019-07-08 NOTE — Telephone Encounter (Signed)
Can she give herself injections at home?  Thanks

## 2019-07-09 LAB — INTRINSIC FACTOR ANTIBODIES: Intrinsic Factor: 1 AU/mL (ref 0.0–1.1)

## 2019-07-11 NOTE — Telephone Encounter (Signed)
Pt states her husband would be okay with giving her the injections at home.  Please send to local pharmacy with how often you want her to do them.

## 2019-07-12 MED ORDER — CYANOCOBALAMIN 1000 MCG/ML IJ SOLN: 1000.0000 ug | INTRAMUSCULAR | 4 refills | Status: DC

## 2019-07-12 NOTE — Telephone Encounter (Signed)
Ok Thanks AP

## 2019-07-17 ENCOUNTER — Telehealth: Payer: Self-pay | Admitting: Internal Medicine

## 2019-07-17 NOTE — Telephone Encounter (Signed)
New message:     Pt states she needs to know how to take cyanocobalamin (,VITAMIN B-12,) 1000 MCG/ML injection. Please advise.

## 2019-07-18 NOTE — Telephone Encounter (Signed)
Pt informed to take b 12 injections bi-weekly per PCP's 07/12/19 message to pt.

## 2019-07-25 DIAGNOSIS — M47816 Spondylosis without myelopathy or radiculopathy, lumbar region: Secondary | ICD-10-CM | POA: Diagnosis not present

## 2019-07-25 DIAGNOSIS — G894 Chronic pain syndrome: Secondary | ICD-10-CM | POA: Diagnosis not present

## 2019-07-25 DIAGNOSIS — M1712 Unilateral primary osteoarthritis, left knee: Secondary | ICD-10-CM | POA: Diagnosis not present

## 2019-07-25 DIAGNOSIS — M48061 Spinal stenosis, lumbar region without neurogenic claudication: Secondary | ICD-10-CM | POA: Diagnosis not present

## 2019-08-02 DIAGNOSIS — M1712 Unilateral primary osteoarthritis, left knee: Secondary | ICD-10-CM | POA: Diagnosis not present

## 2019-08-14 ENCOUNTER — Encounter: Payer: Self-pay | Admitting: Internal Medicine

## 2019-08-14 ENCOUNTER — Other Ambulatory Visit: Payer: Self-pay

## 2019-08-14 ENCOUNTER — Ambulatory Visit (INDEPENDENT_AMBULATORY_CARE_PROVIDER_SITE_OTHER): Payer: Medicare Other | Admitting: Internal Medicine

## 2019-08-14 ENCOUNTER — Telehealth: Payer: Self-pay

## 2019-08-14 VITALS — BP 146/88 | HR 90 | Temp 98.2°F | Resp 16 | Ht 61.0 in | Wt 170.0 lb

## 2019-08-14 DIAGNOSIS — L2084 Intrinsic (allergic) eczema: Secondary | ICD-10-CM | POA: Diagnosis not present

## 2019-08-14 MED ORDER — METHYLPREDNISOLONE ACETATE 80 MG/ML IJ SUSP
120.0000 mg | Freq: Once | INTRAMUSCULAR | Status: AC
  Administered 2019-08-14: 120 mg via INTRAMUSCULAR

## 2019-08-14 MED ORDER — FLUOCINONIDE 0.05 % EX OINT: 1.0000 "application " | TOPICAL_OINTMENT | Freq: Two times a day (BID) | CUTANEOUS | 1 refills | Status: DC

## 2019-08-14 MED ORDER — MONTELUKAST SODIUM 10 MG PO TABS: 10.0000 mg | ORAL_TABLET | Freq: Every day | ORAL | 3 refills | Status: DC

## 2019-08-14 NOTE — Progress Notes (Signed)
Subjective:  Patient ID: Mallory Klein, female    DOB: 20-Oct-1945  Age: 74 y.o. MRN: [id number]  CC: Rash  This visit occurred during the SARS-CoV-2 public health emergency.  Safety protocols were in place, including screening questions prior to the visit, additional usage of staff PPE, and extensive cleaning of exam room while observing appropriate contact time as indicated for disinfecting solutions.    HPI [name] presents for a 4 day hx of intensely itchy rash.  She is not aware of anything new or different that she has been exposed to.  She is not getting much symptom relief with hydroxyzine.  She uses a Butrans patch but has been using this for several months.  She denies swelling around her eyes, mouth, lips, tongue, or throat.  She has a history of asthma but has had no wheezing or shortness of breath.  The rash does not do not come and go from different areas.  Outpatient Medications Prior to Visit  Medication Sig Dispense Refill  . albuterol (VENTOLIN HFA) 108 (90 Base) MCG/ACT inhaler Inhale 2 puffs into the lungs every 6 (six) hours as needed for wheezing or shortness of breath. 18 g 11  . buprenorphine (BUTRANS) 5 MCG/HR PTWK 1 patch once a week.    . carbidopa-levodopa (SINEMET CR) 50-200 MG tablet Take 1 tablet by mouth at bedtime. 90 tablet 1  . Carbidopa-Levodopa ER (SINEMET CR) 25-100 MG tablet controlled release Take 2 tablets at 10AM, 2 at 2PM, 1 at 6PM 450 tablet 1  . Cholecalciferol (VITAMIN D3) 50 MCG (2000 UT) capsule Take 1 capsule (2,000 Units total) by mouth daily. 100 capsule 3  . cyanocobalamin (,VITAMIN B-12,) 1000 MCG/ML injection Inject 1 mL (1,000 mcg total) into the skin every 14 (fourteen) days. 10 mL 4  . diclofenac Sodium (VOLTAREN) 1 % GEL Apply 4 g topically daily.    [name] docusate sodium (COLACE) 100 MG capsule SMARTSIG:1 Tablet(s) By Mouth Twice Daily    . donepezil (ARICEPT) 5 MG tablet TAKE 1 TABLET BY MOUTH AT  BEDTIME 90  tablet 3  . hydrOXYzine (ATARAX/VISTARIL) 25 MG tablet Take 1 tablet (25 mg total) by mouth every 8 (eight) hours as needed for itching (itching from Butrans patch). 60 tablet 0  . hydrOXYzine (VISTARIL) 25 MG capsule Take 25 mg by mouth 3 (three) times daily.    . IRON PO Take 1 tablet by mouth daily.    . temazepam (RESTORIL) 15 MG capsule Take 1-2 capsules (15-30 mg total) by mouth at bedtime as needed for sleep. 60 capsule 3  . venlafaxine XR (EFFEXOR-XR) 150 MG 24 hr capsule TAKE 1 CAPSULE BY MOUTH  EVERY DAY WITH BREAKFAST 90 capsule 3  . verapamil (VERELAN PM) 120 MG 24 hr capsule TAKE 1 CAPSULE (120 MG TOTAL) BY MOUTH AT BEDTIME. 90 capsule 3  . montelukast (SINGULAIR) 10 MG tablet Take 1 tablet (10 mg total) by mouth daily. 90 tablet 3   No facility-administered medications prior to visit.    ROS Review of Systems  Constitutional: Negative for appetite change, diaphoresis, fatigue and unexpected weight change.  HENT: Negative.   Eyes: Negative for visual disturbance.  Respiratory: Negative for cough, chest tightness, shortness of breath and wheezing.   Gastrointestinal: Negative for abdominal pain, constipation, diarrhea, nausea and vomiting.  Endocrine: Negative.   Genitourinary: Negative.   Musculoskeletal: Negative.  Negative for back pain and myalgias.  Skin: Positive for color change  and rash.  Neurological: Negative.  Negative for dizziness, weakness and light-headedness.  Hematological: Negative for adenopathy. Does not bruise/bleed easily.  Psychiatric/Behavioral: Negative.     Objective:  BP (!) 146/88 (BP Location: Left Arm, Patient Position: Sitting, Cuff Size: Large)   Pulse 90   Temp 98.2 F (36.8 C) (Oral)   Resp 16   Ht 5\' 1"  (1.549 m)   Wt 170 lb (77.1 kg)   SpO2 94%   BMI 32.12 kg/m   BP Readings from Last 3 Encounters:  08/14/19 (!) 146/88  07/05/19 (!) 165/84  06/12/19 130/78    Wt Readings from Last 3 Encounters:  08/14/19 170 lb (77.1 kg)    07/05/19 170 lb 9.6 oz (77.4 kg)  06/12/19 170 lb (77.1 kg)    Physical Exam Vitals reviewed.  Constitutional:      General: She is not in acute distress.    Appearance: Normal appearance. She is not ill-appearing or toxic-appearing.  HENT:     Nose: Nose normal.     Mouth/Throat:     Mouth: Mucous membranes are moist.  Eyes:     General: No scleral icterus.    Conjunctiva/sclera: Conjunctivae normal.  Cardiovascular:     Rate and Rhythm: Normal rate and regular rhythm.     Heart sounds: No murmur heard.   Pulmonary:     Effort: Pulmonary effort is normal.     Breath sounds: No stridor. No wheezing, rhonchi or rales.  Abdominal:     General: Abdomen is flat.     Palpations: There is no mass.     Tenderness: There is no abdominal tenderness. There is no guarding.  Musculoskeletal:     Cervical back: Neck supple.  Lymphadenopathy:     Cervical: No cervical adenopathy.  Skin:    General: Skin is warm and dry.     Findings: Erythema and rash present. No bruising. Rash is not macular, nodular, papular, purpuric, scaling, urticarial or vesicular.     Comments: The only thing I see on skin examination is 3 large erythematous patches with excoriation.  There is one on the right lower back and one on each knee.  See photos.  I do not see any wheals, targets, vesicles, pustules, or peeling.  There is no involvement around the site of the Butrans patch over the left flank.  Neurological:     General: No focal deficit present.     Mental Status: She is alert.     Lab Results  Component Value Date   WBC 8.4 07/05/2019   HGB 13.3 07/05/2019   HCT 42.7 07/05/2019   PLT 362 07/05/2019   GLUCOSE 88 06/12/2019   CHOL 219 (H) 02/05/2015   TRIG 69.0 02/05/2015   HDL 73.70 02/05/2015   LDLDIRECT 109.8 02/10/2010   LDLCALC 132 (H) 02/05/2015   ALT 3 06/12/2019   AST 14 06/12/2019   NA 141 06/12/2019   K 4.2 06/12/2019   CL 103 06/12/2019   CREATININE 1.01 06/12/2019   BUN 19  06/12/2019   CO2 31 06/12/2019   TSH 0.82 02/12/2017   INR 0.97 02/07/2014   HGBA1C 6.2 02/12/2017    CT Abdomen Pelvis W Contrast  Result Date: 02/28/2018 CLINICAL DATA:  Abdominal pain EXAM: CT ABDOMEN AND PELVIS WITH CONTRAST TECHNIQUE: Multidetector CT imaging of the abdomen and pelvis was performed using the standard protocol following bolus administration of intravenous contrast. CONTRAST:  160mL ISOVUE-300 IOPAMIDOL (ISOVUE-300) INJECTION 61% COMPARISON:  None. FINDINGS: LOWER CHEST:  There is no basilar pleural or apical pericardial effusion. HEPATOBILIARY: The hepatic contours and density are normal. There is no intra- or extrahepatic biliary dilatation. The gallbladder is normal. PANCREAS: The pancreatic parenchymal contours are normal and there is no ductal dilatation. There is no peripancreatic fluid collection. SPLEEN: Normal. ADRENALS/URINARY TRACT: --Adrenal glands: Normal. --Right kidney/ureter: No hydronephrosis, nephroureterolithiasis, perinephric stranding or solid renal mass. --Left kidney/ureter: No hydronephrosis, nephroureterolithiasis, perinephric stranding or solid renal mass. --Urinary bladder: Normal for degree of distention STOMACH/BOWEL: --Stomach/Duodenum: There is no hiatal hernia or other gastric abnormality. The duodenal course and caliber are normal. --Small bowel: No dilatation or inflammation. --Colon: No focal abnormality. --Appendix: Normal. VASCULAR/LYMPHATIC: There is aortic atherosclerosis without hemodynamically significant stenosis. The portal vein, splenic vein, superior mesenteric vein and IVC are patent. No abdominal or pelvic lymphadenopathy. REPRODUCTIVE: Status post hysterectomy. No adnexal mass. MUSCULOSKELETAL. Multilevel degenerative disc disease and facet arthrosis. No bony spinal canal stenosis. OTHER: None. IMPRESSION: No acute abdominal or pelvic abnormality. Electronically Signed   By: Ulyses Jarred M.D.   On: 02/28/2018 18:50    Assessment & Plan:     Anajah was seen today for rash.  Diagnoses and all orders for this visit:  Intrinsic eczema- I do not see any signs of urticaria, Stevens-Johnson syndrome, or other systemic involvement.  I think she is having an acute flareup of eczema.  Will treat this with an injection of methylprednisolone and topical applications of fluocinonide. -     montelukast (SINGULAIR) 10 MG tablet; Take 1 tablet (10 mg total) by mouth daily. -     fluocinonide ointment (LIDEX) 0.05 %; Apply 1 application topically 2 (two) times daily. -     methylPREDNISolone acetate (DEPO-MEDROL) injection 120 mg   I am having Inaaya P. Guarnieri start on fluocinonide ointment. I am also having her maintain her IRON PO, Vitamin D3, buprenorphine, diclofenac Sodium, donepezil, venlafaxine XR, Carbidopa-Levodopa ER, carbidopa-levodopa, hydrOXYzine, albuterol, temazepam, verapamil, hydrOXYzine, docusate sodium, cyanocobalamin, and montelukast. We administered methylPREDNISolone acetate.  Meds ordered this encounter  Medications  . montelukast (SINGULAIR) 10 MG tablet    Sig: Take 1 tablet (10 mg total) by mouth daily.    Dispense:  90 tablet    Refill:  3  . fluocinonide ointment (LIDEX) 0.05 %    Sig: Apply 1 application topically 2 (two) times daily.    Dispense:  60 g    Refill:  1  . methylPREDNISolone acetate (DEPO-MEDROL) injection 120 mg     Follow-up: Return in about 3 weeks (around 09/04/2019).  Scarlette Calico, MD

## 2019-08-14 NOTE — Patient Instructions (Signed)

## 2019-08-14 NOTE — Telephone Encounter (Signed)
New message   The patient  C/o rash call the [facility] line last night they advise her to call the office and get an appt to see someone today.   The patient is aware the call will transfer to [facility] Triage assessment  Call / spoke with Tyrechia

## 2019-08-15 ENCOUNTER — Telehealth: Payer: Self-pay | Admitting: Internal Medicine

## 2019-08-15 NOTE — Telephone Encounter (Signed)
    Patient calling for  Advice for continued itching Please call on cell phone

## 2019-08-15 NOTE — Telephone Encounter (Signed)
New message:   Pt is calling and states she had an appt on yesterday with Dr. Ronnald Ramp due to Dr. Parks Neptune schedule being full. She states she is still itching very bad and states she needs something else for the itching. Please advise.

## 2019-08-16 NOTE — Telephone Encounter (Signed)
Spoke with pt on clarification about how often to take medications

## 2019-08-16 NOTE — Telephone Encounter (Signed)
Take Zyrtec 10 g daily and Benadryl 25 mg 4 times a day as needed. Thanks

## 2019-08-16 NOTE — Telephone Encounter (Signed)
Pt informed of below.  

## 2019-08-16 NOTE — Telephone Encounter (Signed)
New message:    Pt is calling and have some follow up questions about taking Benadryl. Please advise.

## 2019-08-22 DIAGNOSIS — M1712 Unilateral primary osteoarthritis, left knee: Secondary | ICD-10-CM | POA: Diagnosis not present

## 2019-08-22 DIAGNOSIS — M47816 Spondylosis without myelopathy or radiculopathy, lumbar region: Secondary | ICD-10-CM | POA: Diagnosis not present

## 2019-08-22 DIAGNOSIS — M48061 Spinal stenosis, lumbar region without neurogenic claudication: Secondary | ICD-10-CM | POA: Diagnosis not present

## 2019-08-22 DIAGNOSIS — G894 Chronic pain syndrome: Secondary | ICD-10-CM | POA: Diagnosis not present

## 2019-08-28 ENCOUNTER — Other Ambulatory Visit: Payer: Self-pay | Admitting: Internal Medicine

## 2019-09-07 ENCOUNTER — Telehealth: Payer: Self-pay | Admitting: Internal Medicine

## 2019-09-07 NOTE — Telephone Encounter (Signed)
Patient called states she is having a lot of abdominal discomfort and pain please advise

## 2019-09-07 NOTE — Telephone Encounter (Signed)
Spoke with patient, pt advised that she hadn't been seen in a while and would need an appointment so that she can be evaluated.  Pt states that she ate some bbq, beans, and slaw and as soon as she stood up that she had to vomit, pt states that she feels tightness in her stomach. Reports that she wasn't feeling well at all after. Parkinson's medication causing issue for patient, she stated that she discussed this with her doctor and they were going to change it but have not done that yet. Pt states that she feels like she has been forcing herself to eat. Pt advised to eat small frequent meals, bland foods such as oatmeal and grits and to stay well hydrated with gatorade, Pedialyte and water. Advised to keep upcoming appt with PCP to assess symptoms further.

## 2019-09-11 ENCOUNTER — Ambulatory Visit (INDEPENDENT_AMBULATORY_CARE_PROVIDER_SITE_OTHER): Payer: Medicare Other | Admitting: Internal Medicine

## 2019-09-11 ENCOUNTER — Other Ambulatory Visit: Payer: Self-pay

## 2019-09-11 ENCOUNTER — Encounter: Payer: Self-pay | Admitting: Internal Medicine

## 2019-09-11 DIAGNOSIS — I10 Essential (primary) hypertension: Secondary | ICD-10-CM | POA: Diagnosis not present

## 2019-09-11 DIAGNOSIS — J4540 Moderate persistent asthma, uncomplicated: Secondary | ICD-10-CM

## 2019-09-11 DIAGNOSIS — G20 Parkinson's disease: Secondary | ICD-10-CM | POA: Diagnosis not present

## 2019-09-11 DIAGNOSIS — E538 Deficiency of other specified B group vitamins: Secondary | ICD-10-CM

## 2019-09-11 DIAGNOSIS — E559 Vitamin D deficiency, unspecified: Secondary | ICD-10-CM

## 2019-09-11 DIAGNOSIS — A059 Bacterial foodborne intoxication, unspecified: Secondary | ICD-10-CM | POA: Diagnosis not present

## 2019-09-11 MED ORDER — PANTOPRAZOLE SODIUM 40 MG PO TBEC
40.0000 mg | DELAYED_RELEASE_TABLET | Freq: Every day | ORAL | 11 refills | Status: DC
Start: 2019-09-11 — End: 2019-11-27

## 2019-09-11 MED ORDER — TEMAZEPAM 15 MG PO CAPS: 15.0000 mg | ORAL_CAPSULE | Freq: Every evening | ORAL | 3 refills | Status: DC | PRN

## 2019-09-11 NOTE — Assessment & Plan Note (Signed)
Verapamil 

## 2019-09-11 NOTE — Progress Notes (Signed)
Subjective:  Patient ID: Mallory Klein, female    DOB: 06-Nov-1945  Age: 74 y.o. MRN: [id number]  CC: No chief complaint on file.   HPI MARTHELLA OSORNO presents for Parkinson's f/u - the pt wants to switch to Dr Rexene Alberts LBP, DM, asthma f/u The pt ate BBQ last week - vomiting all night x 1 d. Now c/o GERD sx's. No diarrhea  Outpatient Medications Prior to Visit  Medication Sig Dispense Refill  . albuterol (VENTOLIN HFA) 108 (90 Base) MCG/ACT inhaler Inhale 2 puffs into the lungs every 6 (six) hours as needed for wheezing or shortness of breath. 18 g 11  . buprenorphine (BUTRANS) 5 MCG/HR PTWK 1 patch once a week.    . carbidopa-levodopa (SINEMET CR) 50-200 MG tablet Take 1 tablet by mouth at bedtime. 90 tablet 1  . Carbidopa-Levodopa ER (SINEMET CR) 25-100 MG tablet controlled release Take 2 tablets at 10AM, 2 at 2PM, 1 at 6PM 450 tablet 1  . cetirizine (ZYRTEC) 10 MG tablet TAKE 1 TABLET BY MOUTH EVERY DAY 90 tablet 3  . Cholecalciferol (VITAMIN D3) 50 MCG (2000 UT) capsule Take 1 capsule (2,000 Units total) by mouth daily. 100 capsule 3  . cyanocobalamin (,VITAMIN B-12,) 1000 MCG/ML injection Inject 1 mL (1,000 mcg total) into the skin every 14 (fourteen) days. 10 mL 4  . diclofenac Sodium (VOLTAREN) 1 % GEL Apply 4 g topically daily.    [name] docusate sodium (COLACE) 100 MG capsule SMARTSIG:1 Tablet(s) By Mouth Twice Daily    . donepezil (ARICEPT) 5 MG tablet TAKE 1 TABLET BY MOUTH AT  BEDTIME 90 tablet 3  . fluocinonide ointment (LIDEX) 7.98 % Apply 1 application topically 2 (two) times daily. 60 g 1  . hydrOXYzine (ATARAX/VISTARIL) 25 MG tablet Take 1 tablet (25 mg total) by mouth every 8 (eight) hours as needed for itching (itching from Butrans patch). 60 tablet 0  . IRON PO Take 1 tablet by mouth daily.    . montelukast (SINGULAIR) 10 MG tablet Take 1 tablet (10 mg total) by mouth daily. 90 tablet 3  . temazepam (RESTORIL) 15 MG capsule Take 1-2 capsules (15-30 mg total) by mouth at  bedtime as needed for sleep. 60 capsule 3  . venlafaxine XR (EFFEXOR-XR) 150 MG 24 hr capsule TAKE 1 CAPSULE BY MOUTH  EVERY DAY WITH BREAKFAST 90 capsule 3  . verapamil (VERELAN PM) 120 MG 24 hr capsule TAKE 1 CAPSULE (120 MG TOTAL) BY MOUTH AT BEDTIME. 90 capsule 3  . hydrOXYzine (VISTARIL) 25 MG capsule Take 25 mg by mouth 3 (three) times daily.     No facility-administered medications prior to visit.    ROS: Review of Systems  Constitutional: Negative for activity change, appetite change, chills, fatigue and unexpected weight change.  HENT: Negative for congestion, mouth sores and sinus pressure.   Eyes: Negative for visual disturbance.  Respiratory: Negative for cough and chest tightness.   Gastrointestinal: Negative for abdominal pain and nausea.  Genitourinary: Negative for difficulty urinating, frequency and vaginal pain.  Musculoskeletal: Positive for back pain and gait problem.  Skin: Negative for pallor and rash.  Neurological: Positive for tremors and weakness. Negative for dizziness, numbness and headaches.  Psychiatric/Behavioral: Negative for confusion, sleep disturbance and suicidal ideas.    Objective:  BP (!) 150/88 (BP Location: Right Arm, Patient Position: Sitting, Cuff Size: Large)   Pulse 96   Temp 98.7 F (37.1 C) (Oral)   Ht 5\' 1"  (1.549 m)   Wt  170 lb (77.1 kg)   SpO2 96%   BMI 32.12 kg/m   BP Readings from Last 3 Encounters:  09/11/19 (!) 150/88  08/14/19 (!) 146/88  07/05/19 (!) 165/84    Wt Readings from Last 3 Encounters:  09/11/19 170 lb (77.1 kg)  08/14/19 170 lb (77.1 kg)  07/05/19 170 lb 9.6 oz (77.4 kg)    Physical Exam Constitutional:      General: She is not in acute distress.    Appearance: She is well-developed. She is obese.  HENT:     Head: Normocephalic.     Right Ear: External ear normal.     Left Ear: External ear normal.     Nose: Nose normal.  Eyes:     General:        Right eye: No discharge.        Left eye: No  discharge.     Conjunctiva/sclera: Conjunctivae normal.     Pupils: Pupils are equal, round, and reactive to light.  Neck:     Thyroid: No thyromegaly.     Vascular: No JVD.     Trachea: No tracheal deviation.  Cardiovascular:     Rate and Rhythm: Normal rate and regular rhythm.     Heart sounds: Normal heart sounds.  Pulmonary:     Effort: No respiratory distress.     Breath sounds: No stridor. No wheezing.  Abdominal:     General: Bowel sounds are normal. There is no distension.     Palpations: Abdomen is soft. There is no mass.     Tenderness: There is no abdominal tenderness. There is no guarding or rebound.  Musculoskeletal:        General: Tenderness present.     Cervical back: Normal range of motion and neck supple.  Lymphadenopathy:     Cervical: No cervical adenopathy.  Skin:    Findings: No erythema or rash.  Neurological:     Mental Status: She is oriented to person, place, and time.     Cranial Nerves: No cranial nerve deficit.     Motor: No abnormal muscle tone.     Coordination: Coordination abnormal.     Gait: Gait abnormal.     Deep Tendon Reflexes: Reflexes normal.  Psychiatric:        Behavior: Behavior normal.        Thought Content: Thought content normal.        Judgment: Judgment normal.   walker tremor  Lab Results  Component Value Date   WBC 8.4 07/05/2019   HGB 13.3 07/05/2019   HCT 42.7 07/05/2019   PLT 362 07/05/2019   GLUCOSE 88 06/12/2019   CHOL 219 (H) 02/05/2015   TRIG 69.0 02/05/2015   HDL 73.70 02/05/2015   LDLDIRECT 109.8 02/10/2010   LDLCALC 132 (H) 02/05/2015   ALT 3 06/12/2019   AST 14 06/12/2019   NA 141 06/12/2019   K 4.2 06/12/2019   CL 103 06/12/2019   CREATININE 1.01 06/12/2019   BUN 19 06/12/2019   CO2 31 06/12/2019   TSH 0.82 02/12/2017   INR 0.97 02/07/2014   HGBA1C 6.2 02/12/2017    CT Abdomen Pelvis W Contrast  Result Date: 02/28/2018 CLINICAL DATA:  Abdominal pain EXAM: CT ABDOMEN AND PELVIS WITH CONTRAST  TECHNIQUE: Multidetector CT imaging of the abdomen and pelvis was performed using the standard protocol following bolus administration of intravenous contrast. CONTRAST:  164mL ISOVUE-300 IOPAMIDOL (ISOVUE-300) INJECTION 61% COMPARISON:  None. FINDINGS: LOWER CHEST: There is  no basilar pleural or apical pericardial effusion. HEPATOBILIARY: The hepatic contours and density are normal. There is no intra- or extrahepatic biliary dilatation. The gallbladder is normal. PANCREAS: The pancreatic parenchymal contours are normal and there is no ductal dilatation. There is no peripancreatic fluid collection. SPLEEN: Normal. ADRENALS/URINARY TRACT: --Adrenal glands: Normal. --Right kidney/ureter: No hydronephrosis, nephroureterolithiasis, perinephric stranding or solid renal mass. --Left kidney/ureter: No hydronephrosis, nephroureterolithiasis, perinephric stranding or solid renal mass. --Urinary bladder: Normal for degree of distention STOMACH/BOWEL: --Stomach/Duodenum: There is no hiatal hernia or other gastric abnormality. The duodenal course and caliber are normal. --Small bowel: No dilatation or inflammation. --Colon: No focal abnormality. --Appendix: Normal. VASCULAR/LYMPHATIC: There is aortic atherosclerosis without hemodynamically significant stenosis. The portal vein, splenic vein, superior mesenteric vein and IVC are patent. No abdominal or pelvic lymphadenopathy. REPRODUCTIVE: Status post hysterectomy. No adnexal mass. MUSCULOSKELETAL. Multilevel degenerative disc disease and facet arthrosis. No bony spinal canal stenosis. OTHER: None. IMPRESSION: No acute abdominal or pelvic abnormality. Electronically Signed   By: Ulyses Jarred M.D.   On: 02/28/2018 18:50    Assessment & Plan:     Follow-up: No follow-ups on file.  Walker Kehr, MD

## 2019-09-11 NOTE — Assessment & Plan Note (Signed)
Vit D 

## 2019-09-11 NOTE — Assessment & Plan Note (Signed)
On B12 

## 2019-09-11 NOTE — Assessment & Plan Note (Signed)
Breo, Singulair

## 2019-09-11 NOTE — Addendum Note (Signed)
Addended by: Cresenciano Lick on: 09/11/2019 02:51 PM   Modules accepted: Orders

## 2019-09-11 NOTE — Assessment & Plan Note (Addendum)
The pt wants to switch to Dr Rexene Alberts

## 2019-09-11 NOTE — Assessment & Plan Note (Addendum)
The pt ate BBQ last week - vomiting all night Resolved

## 2019-09-12 ENCOUNTER — Encounter: Payer: Self-pay | Admitting: Nurse Practitioner

## 2019-09-12 LAB — CBC WITH DIFFERENTIAL/PLATELET
Absolute Monocytes: 663 cells/uL (ref 200–950)
Basophils Absolute: 51 cells/uL (ref 0–200)
Basophils Relative: 0.6 %
Eosinophils Absolute: 128 cells/uL (ref 15–500)
Eosinophils Relative: 1.5 %
HCT: 42.7 % (ref 35.0–45.0)
Hemoglobin: 13.8 g/dL (ref 11.7–15.5)
Lymphs Abs: 2593 cells/uL (ref 850–3900)
MCH: 29.6 pg (ref 27.0–33.0)
MCHC: 32.3 g/dL (ref 32.0–36.0)
MCV: 91.4 fL (ref 80.0–100.0)
MPV: 10.8 fL (ref 7.5–12.5)
Monocytes Relative: 7.8 %
Neutro Abs: 5066 cells/uL (ref 1500–7800)
Neutrophils Relative %: 59.6 %
Platelets: 361 10*3/uL (ref 140–400)
RBC: 4.67 10*6/uL (ref 3.80–5.10)
RDW: 13 % (ref 11.0–15.0)
Total Lymphocyte: 30.5 %
WBC: 8.5 10*3/uL (ref 3.8–10.8)

## 2019-09-12 LAB — BASIC METABOLIC PANEL
BUN: 13 mg/dL (ref 7–25)
CO2: 35 mmol/L — ABNORMAL HIGH (ref 20–32)
Calcium: 9.9 mg/dL (ref 8.6–10.4)
Chloride: 104 mmol/L (ref 98–110)
Creat: 0.91 mg/dL (ref 0.60–0.93)
Glucose, Bld: 93 mg/dL (ref 65–99)
Potassium: 4.3 mmol/L (ref 3.5–5.3)
Sodium: 145 mmol/L (ref 135–146)

## 2019-09-12 LAB — VITAMIN B12: Vitamin B-12: 1668 pg/mL — ABNORMAL HIGH (ref 200–1100)

## 2019-09-12 NOTE — Progress Notes (Signed)
This encounter was created in error - please disregard.

## 2019-09-12 NOTE — Progress Notes (Deleted)
Assessment/Plan:   1.  Parkinsons Disease  -***Discussed again the importance of compliance.  We have discussed this almost every visit.  We also discussed the importance of getting out of bed by 10 AM.  -Take carbidopa/levodopa 25/100 CR, 2 tablets at 10 AM/2 tablets at 2 PM/1 tablet at 6 PM  -Take carbidopa/levodopa 50/200 at bedtime  2.  RBD  -Patient on fairly high-dose temazepam (30 mg at bedtime).  I think she gets hangover effect from this and gets up very late because of it.  Have discussed with her that I would like to see her off of that medication, or at least the dose tapered.  Subjective:   Mallory Klein was seen today in follow up for Parkinsons disease.  My previous records were reviewed prior to todays visit as well as outside records available to me.  Primidone was discontinued.  Nighttime levodopa was added last visit.  Pt denies falls.  Pt denies lightheadedness, near syncope.  No hallucinations.  Mood has been good.  Patient saw primary care on September 11, 2019.  At that point in time, she requested transfer of care to Dr. Rexene Alberts.  Referral was placed by primary care.  Current prescribed movement disorder medications: ***Carbidopa/levodopa 25/100 CR, 2 tablets at 10 AM/2 tablets at 2 PM/1 tablet at 6 PM Carbidopa/levodopa 50/200 CR at bedtime (started last visit)   PREVIOUS MEDICATIONS: {Parkinson's RX:18200}  ALLERGIES:   Allergies  Allergen Reactions  . Ace Inhibitors     Patient doesn't recall reaction.  jkl  . Benicar [Olmesartan]     It made her sick  . Aspirin Other (See Comments)    bruising  . Citalopram Hydrobromide Diarrhea    CURRENT MEDICATIONS:  Outpatient Encounter Medications as of 09/26/2019  Medication Sig  . albuterol (VENTOLIN HFA) 108 (90 Base) MCG/ACT inhaler Inhale 2 puffs into the lungs every 6 (six) hours as needed for wheezing or shortness of breath.  . buprenorphine (BUTRANS) 5 MCG/HR PTWK 1 patch once a week.  .  carbidopa-levodopa (SINEMET CR) 50-200 MG tablet Take 1 tablet by mouth at bedtime.  . Carbidopa-Levodopa ER (SINEMET CR) 25-100 MG tablet controlled release Take 2 tablets at 10AM, 2 at 2PM, 1 at 6PM  . cetirizine (ZYRTEC) 10 MG tablet TAKE 1 TABLET BY MOUTH EVERY DAY  . Cholecalciferol (VITAMIN D3) 50 MCG (2000 UT) capsule Take 1 capsule (2,000 Units total) by mouth daily.  . cyanocobalamin (,VITAMIN B-12,) 1000 MCG/ML injection Inject 1 mL (1,000 mcg total) into the skin every 14 (fourteen) days.  . diclofenac Sodium (VOLTAREN) 1 % GEL Apply 4 g topically daily.  [name] docusate sodium (COLACE) 100 MG capsule SMARTSIG:1 Tablet(s) By Mouth Twice Daily  . donepezil (ARICEPT) 5 MG tablet TAKE 1 TABLET BY MOUTH AT  BEDTIME  . fluocinonide ointment (LIDEX) 2.84 % Apply 1 application topically 2 (two) times daily.  . hydrOXYzine (ATARAX/VISTARIL) 25 MG tablet Take 1 tablet (25 mg total) by mouth every 8 (eight) hours as needed for itching (itching from Butrans patch).  . IRON PO Take 1 tablet by mouth daily.  . montelukast (SINGULAIR) 10 MG tablet Take 1 tablet (10 mg total) by mouth daily.  . pantoprazole (PROTONIX) 40 MG tablet Take 1 tablet (40 mg total) by mouth daily.  . temazepam (RESTORIL) 15 MG capsule Take 1-2 capsules (15-30 mg total) by mouth at bedtime as needed for sleep.  [name] venlafaxine XR (EFFEXOR-XR) 150 MG 24 hr capsule TAKE 1 CAPSULE  BY MOUTH  EVERY DAY WITH BREAKFAST  . verapamil (VERELAN PM) 120 MG 24 hr capsule TAKE 1 CAPSULE (120 MG TOTAL) BY MOUTH AT BEDTIME.   No facility-administered encounter medications on file as of 09/26/2019.    Objective:   PHYSICAL EXAMINATION:    VITALS:  There were no vitals filed for this visit.  GEN:  The patient appears stated age and is in NAD. HEENT:  Normocephalic, atraumatic.  The mucous membranes are moist. The superficial temporal arteries are without ropiness or tenderness. CV:  RRR Lungs:  CTAB Neck/HEME:  There are no carotid bruits  bilaterally.  Neurological examination:  Orientation: The patient is alert and oriented x3. Cranial nerves: There is good facial symmetry with*** facial hypomimia. The speech is fluent and clear. Soft palate rises symmetrically and there is no tongue deviation. Hearing is intact to conversational tone. Sensation: Sensation is intact to light touch throughout Motor: Strength is at least antigravity x4.  Movement examination: Tone: There is ***tone in the *** Abnormal movements: *** Coordination:  There is *** decremation with RAM's, *** Gait and Station: The patient has *** difficulty arising out of a deep-seated chair without the use of the hands. The patient's stride length is ***.  The patient has a *** pull test.     I have reviewed and interpreted the following labs independently    Chemistry      Component Value Date/Time   NA 145 09/11/2019 1451   K 4.3 09/11/2019 1451   CL 104 09/11/2019 1451   CO2 35 (H) 09/11/2019 1451   BUN 13 09/11/2019 1451   CREATININE 0.91 09/11/2019 1451      Component Value Date/Time   CALCIUM 9.9 09/11/2019 1451   ALKPHOS 66 06/12/2019 1715   AST 14 06/12/2019 1715   ALT 3 06/12/2019 1715   BILITOT 0.4 06/12/2019 1715       Lab Results  Component Value Date   WBC 8.5 09/11/2019   HGB 13.8 09/11/2019   HCT 42.7 09/11/2019   MCV 91.4 09/11/2019   PLT 361 09/11/2019    Lab Results  Component Value Date   TSH 0.82 02/12/2017     Total time spent on today's visit was ***30 minutes, including both face-to-face time and nonface-to-face time.  Time included that spent on review of records (prior notes available to me/labs/imaging if pertinent), discussing treatment and goals, answering patient's questions and coordinating care.  Cc:  Plotnikov, Evie Lacks, MD

## 2019-09-19 ENCOUNTER — Ambulatory Visit: Payer: Medicare Other | Admitting: Neurology

## 2019-09-20 DIAGNOSIS — M48061 Spinal stenosis, lumbar region without neurogenic claudication: Secondary | ICD-10-CM | POA: Diagnosis not present

## 2019-09-20 DIAGNOSIS — G894 Chronic pain syndrome: Secondary | ICD-10-CM | POA: Diagnosis not present

## 2019-09-20 DIAGNOSIS — M47816 Spondylosis without myelopathy or radiculopathy, lumbar region: Secondary | ICD-10-CM | POA: Diagnosis not present

## 2019-09-20 DIAGNOSIS — M1712 Unilateral primary osteoarthritis, left knee: Secondary | ICD-10-CM | POA: Diagnosis not present

## 2019-09-22 ENCOUNTER — Other Ambulatory Visit: Payer: Self-pay | Admitting: Neurology

## 2019-09-26 ENCOUNTER — Ambulatory Visit: Payer: Medicare Other | Admitting: Neurology

## 2019-09-26 ENCOUNTER — Telehealth: Payer: Self-pay | Admitting: Internal Medicine

## 2019-09-26 NOTE — Telephone Encounter (Signed)
RX DECLINED; PATIENT MUST CONTACT OFFICE AND SCHEDULE APPT. PATIENT HAS CANCELED HER LAST TWO APPOINTMENTS.

## 2019-09-26 NOTE — Telephone Encounter (Signed)
F/U   The patient calling checking on the status of previous messages from this morning

## 2019-09-26 NOTE — Telephone Encounter (Signed)
   Patient calling crying and very emotional. She states the pain in her back is horrible.Patient states she has left a message for the Pain Management office to call her back on yesterday and tried reaching them this morning.  No response at this time. Patient is requesting advice and medication for pain.

## 2019-09-27 MED ORDER — CELECOXIB 200 MG PO CAPS
200.0000 mg | ORAL_CAPSULE | Freq: Two times a day (BID) | ORAL | 0 refills | Status: DC | PRN
Start: 2019-09-27 — End: 2020-03-27

## 2019-09-27 NOTE — Telephone Encounter (Signed)
Left pt detailed message of below 

## 2019-09-27 NOTE — Telephone Encounter (Signed)
Pt having back pains and would like medication, please advise

## 2019-09-27 NOTE — Telephone Encounter (Signed)
I'm sorry. I'll send a Rx for celebrex. Jaleea needs to talk to pain clinic re: other meds for pain. Thx

## 2019-10-08 ENCOUNTER — Other Ambulatory Visit: Payer: Self-pay | Admitting: Neurology

## 2019-10-26 ENCOUNTER — Telehealth: Payer: Self-pay | Admitting: Internal Medicine

## 2019-10-26 NOTE — Telephone Encounter (Signed)
May 01, 2019 Dispensing prescription on 04/14/19 Need a diagnosis for the order and an addendum to the note stating the medical necessity  Jackelyn Poling [phone]

## 2019-10-31 NOTE — Telephone Encounter (Signed)
Debbie from [employer] states that they receive an order for a knee brace for pt 04/14/2019 and she needs you to create an addendum note for the 01/26/20 states the necessity for the brace. Please advise

## 2019-11-02 NOTE — Assessment & Plan Note (Signed)
Knee brace is medically necessary

## 2019-11-02 NOTE — Telephone Encounter (Signed)
Left Debbie a vm of below

## 2019-11-02 NOTE — Telephone Encounter (Signed)
OK - 01/26/19 note edited Thx

## 2019-11-05 ENCOUNTER — Other Ambulatory Visit: Payer: Self-pay | Admitting: Neurology

## 2019-11-16 DIAGNOSIS — M48061 Spinal stenosis, lumbar region without neurogenic claudication: Secondary | ICD-10-CM | POA: Diagnosis not present

## 2019-11-16 DIAGNOSIS — M47816 Spondylosis without myelopathy or radiculopathy, lumbar region: Secondary | ICD-10-CM | POA: Diagnosis not present

## 2019-11-16 DIAGNOSIS — M1712 Unilateral primary osteoarthritis, left knee: Secondary | ICD-10-CM | POA: Diagnosis not present

## 2019-11-16 DIAGNOSIS — G894 Chronic pain syndrome: Secondary | ICD-10-CM | POA: Diagnosis not present

## 2019-11-22 ENCOUNTER — Other Ambulatory Visit: Payer: Self-pay

## 2019-11-22 ENCOUNTER — Encounter: Payer: Self-pay | Admitting: Neurology

## 2019-11-22 ENCOUNTER — Other Ambulatory Visit: Payer: Self-pay | Admitting: Neurology

## 2019-11-22 ENCOUNTER — Ambulatory Visit (INDEPENDENT_AMBULATORY_CARE_PROVIDER_SITE_OTHER): Payer: Medicare Other | Admitting: Neurology

## 2019-11-22 VITALS — BP 144/84 | HR 78 | Ht 61.5 in | Wt 173.0 lb

## 2019-11-22 DIAGNOSIS — G20 Parkinson's disease: Secondary | ICD-10-CM | POA: Diagnosis not present

## 2019-11-22 DIAGNOSIS — R269 Unspecified abnormalities of gait and mobility: Secondary | ICD-10-CM | POA: Diagnosis not present

## 2019-11-22 DIAGNOSIS — G8929 Other chronic pain: Secondary | ICD-10-CM

## 2019-11-22 DIAGNOSIS — M5441 Lumbago with sciatica, right side: Secondary | ICD-10-CM

## 2019-11-22 DIAGNOSIS — M25561 Pain in right knee: Secondary | ICD-10-CM | POA: Diagnosis not present

## 2019-11-22 DIAGNOSIS — M25562 Pain in left knee: Secondary | ICD-10-CM

## 2019-11-22 MED ORDER — CARBIDOPA-LEVODOPA ER 50-200 MG PO TBCR
EXTENDED_RELEASE_TABLET | ORAL | 3 refills | Status: DC
Start: 2019-11-22 — End: 2020-07-23

## 2019-11-22 NOTE — Patient Instructions (Signed)
It was nice to meet you both today.  I do not believe you have right-sided Parkinson's disease with fine motor impairment and tremor primarily on the right more than left side.    I do want to suggest a few things today:  We will monitor your exam and your memory.   Remember to drink plenty of fluid at least 6 glasses (8 oz each), eat healthy meals and do not skip any meals. Try to eat protein with a every meal and eat a healthy snack such as fruit or nuts in between meals. Try to keep a regular sleep-wake schedule and try to exercise daily, particularly in the form of walking, if you can, of course within your limitations given that you have chronic pain affecting the back and knees, please use your walker at all times as instructed by physical therapy as well.  I believe you are at higher fall risk. Please look into getting a call alert button, like Life Alert.   Try to stay active physically and mentally.   As far as your medications are concerned, I would like to suggest that we switch you to Sinemet CR which is a long-acting formulation of Sinemet.  You are already taking that 50-200 mg strength at bedtime.  I suggest you switch it to 1 pill three times a day, namely at 11 AM, 5 PM and 10 PM.    We may have to increase this to 1 pill four times a day in the near future.  It is best absorbed on an empty stomach.  You could take it with a small snack such as crackers, please try to take the medication away from you mealtimes, that is, ideally either one hour before or 2 hours after your meal to ensure optimal absorption. The medication can interfere with the protein content of your meal and trying to the protein in your food and therefore not get fully absorbed.   Common side effects reported are: Nausea, vomiting, sedation, confusion, lightheadedness. Rare side effects include hallucinations, severe nausea or vomiting, diarrhea and significant drop in blood pressure especially when going from  lying to standing or from sitting to standing.   I do not think we have to add any diagnostic testing or scans at this time.  For now, we will have you stop the Sinemet CR 25-100 mg strength.   I would like to see you back in 3 months, sooner if we need to. Please call us with any interim questions, concerns, problems, updates or refill requests.  Our phone number is [phone]. We also have an after hours call service for urgent matters and there is a physician on-call for urgent questions, that cannot wait till the next work day. For any emergencies you know to call 911 or go to the nearest emergency room.   You can email me through my chart and also leave a phone message for Jinny Blossom, my nurse.

## 2019-11-22 NOTE — Progress Notes (Signed)
Subjective:    Klein ID: Mallory Klein is a 74 y.o. female.  HPI     Star Age, MD, PhD [facility] [address] [place], [place] [address]  Dear Dr. Alain Marion,   I saw your Klein, Mallory Klein, upon your kind request, in my neurologic clinic today for initial consultation of Mallory Klein parkinsonism, requesting transfer of care.  Mallory Klein is accompanied by Mallory Klein husband today. As you know, Mallory Klein is a 74 year old right-handed woman with an underlying medical history of tremors, sleep apnea, iron deficiency anemia, vitamin D deficiency, Low back pain, with history of lumbar degenerative spine disease, followed by pain management, reflux disease, hypertension, diverticulosis, asthma, and mild obesity, who reports tremors for Mallory past approximately 3 years.  Mallory Klein has had changes in Mallory Klein walking abilities.  Mallory Klein has had posture changes but also has undergone several surgeries and had injuries in Mallory past.  Mallory Klein has chronic back pain and had a failed right knee replacement which complicates Mallory situation.  Mallory Klein has seen a decline in Mallory Klein ability to write.  Mallory Klein reports that Mallory Klein tremor is worse on Mallory right side.  Mallory Klein wonders if Mallory Klein father had Parkinson's disease.  He had decline in his handwriting and also developed a tremor.   Mallory Klein started seeing [name] at [facility] neurology in October 2019 and I reviewed available neurology notes.  Mallory Klein was diagnosed with tremor predominant Parkinson's disease at Mallory time and started on immediate release Sinemet.  Mallory Klein was subsequently switched from Sinemet IR to Sinemet CR. I reviewed your office note from 09/11/2019.  Mallory Klein had some blood work at Mallory time including B12 level and BMP, kidney function was good, B12 level elevated. Mallory Klein reports difficulty adhering to a medication schedule as Mallory Klein does have side effects from taking Mallory levodopa particularly queasiness.  Mallory Klein also believes that taking it on an empty stomach makes  it worse.  Mallory Klein has not been sleeping very well and sometimes goes to sleep in Mallory early morning hours around 5 or 6 AM even.  Mallory Klein has had a recent prescription from your office for temazepam and started taking 15 mg first and then increase it to 30 mg.  Mallory Klein tries to take it at night, reports that Mallory Klein has been able to go to sleep around 1 AM at times.  Mallory Klein is hoping to scale back to 15 mg daily.  Mallory Klein is on chronic pain medication.  Mallory Klein is supposed to take Sinemet CR 25-100 mg strength 2 pills 3 times daily at 10 AM, 2 PM and 6 PM.  Mallory Klein tries to adhere to Mallory schedule.  Sometimes Mallory Klein does not take it 3 times a day and only twice daily but admits that Mallory Klein has taken 3 tablets together as well.  Mallory Klein is taking Sinemet CR 50-200 mg strength at bedtime.  Mallory Klein drinks caffeine in Mallory form of ice tea, 1 or 2 cups/day.  Mallory Klein is a non-smoker.  Mallory Klein drinks alcohol occasionally in Mallory form of white wine.  Mallory Klein lives with Mallory Klein husband and 1 dog.  They had 2 children, one passed away.  Mallory Klein also takes baclofen for Mallory Klein back pain.  Mallory Klein has been on Aricept for memory loss.  Mallory Klein had neuropsychological evaluation through Mallory [facility] neuropsychology, [name] and was deemed to have mild cognitive impairment.  Mallory Klein denies any hallucinations but has had instances of hearing a shuffling sound in Mallory Klein ears, it happens infrequently and has actually improved.  In Mallory past, Mallory Klein also had muffled hearing and was found to have cerumen impaction.  Mallory Klein Past Medical History Is Significant For: Past Medical History:  Diagnosis Date  . Abnormal CBC 02/05/2015   [name] 12/16 new: normocytic anemia and thrombocytosis  . Adjustment disorder with mixed anxiety and depressed mood 02/15/2007   Chronic  Chronic pain Grief, stress Effexor XR  Dad died in March 12, 2023  . Asthma   . Diverticulosis of colon (without mention of hemorrhage)   . Esophageal stricture   . Essential hypertension 01/26/2007   Chronic Verapamil   . GERD (gastroesophageal reflux disease)  02/15/2007   Chronic    . Heart murmur   . Hyperglycemia 03/02/2011   Mild    . Insomnia disorder 12/08/2018   10/20 Carbid/Lev dose was increased - c/o hard time falling asleep (6 am) getting up at 12 am, poor sleep. Try Temazepam 15-30 mg at 11-1:30 pm   . Internal hemorrhoids without mention of complication   . Iron deficiency anemia 04/23/2017  . Mild neurocognitive disorder, likely due to Parkinson's disease 02/09/2019  . OSA (obstructive sleep apnea) 02/14/2010   In lab study (May 2018): AHI 25. Only had 45 minutes of sleep secondary to frequent awakenings secondary to sleep apnea. autocpap 5-15 cm water.   . Osteoarthritis   . Parkinson disease ([place]) 2014-03-11   2015 2017  Primidone - d/c 2019 Parkinson's: Sinmet IR. Dr Tat  . Postherpetic trigeminal neuralgia 01/26/2007   Qualifier: Diagnosis of  By: Marca Ancona RMA, [name]    . Shortness of breath dyspnea    with allergies  . Thrombocytosis ([place]) 05/24/2017  . Urinary incontinence 01/26/2007   Chronic  10/17 Detrol LA   . Vitamin D deficiency 01/26/2019    Mallory Klein Past Surgical History Is Significant For: Past Surgical History:  Procedure Laterality Date  . ABDOMINAL HYSTERECTOMY    . BREAST CYST EXCISION Left 1980's  . LUMBAR LAMINECTOMY/DECOMPRESSION MICRODISCECTOMY Bilateral 02/20/2014   Procedure: LUMBAR TWO THREE, LUMBAR THREE FOUR LUMBAR LAMINECTOMY/DECOMPRESSION MICRODISCECTOMY 2 LEVELS;  Surgeon: Eustace Moore, MD;  Location: [facility];  Service: Neurosurgery;  Laterality: Bilateral;  . SPLENECTOMY    . TIBIA FRACTURE SURGERY Right   . TIBIA FRACTURE SURGERY Left   . TOTAL KNEE ARTHROPLASTY Right 2013    Mallory Klein Family History Is Significant For: Family History  Problem Relation Age of Onset  . Hypertension Mother   . Diabetes type II Mother   . Asthma Mother   . Diabetes Mother   . Dementia Mother   . Prostate cancer Father   . Mental illness Father        dementia  . Suicidality Son   . Stroke Brother        died in  prison  . Colon cancer Neg Hx   . Rectal cancer Neg Hx     Mallory Klein Social History Is Significant For: Social History   Socioeconomic History  . Marital status: Married    Spouse name: Not on file  . Number of children: 2  . Years of education: 23  . Highest education level: Associate degree: occupational, Hotel manager, or vocational program  Occupational History  . Occupation: Retired    Comment: Network engineer, admin assist   Tobacco Use  . Smoking status: Never Smoker  . Smokeless tobacco: Never Used  Vaping Use  . Vaping Use: Never used  Substance and Sexual Activity  . Alcohol use: No    Alcohol/week: 2.0 standard drinks  Types: 2 Glasses of wine per week    Comment: wine on occ   . Drug use: No  . Sexual activity: Not Currently  Other Topics Concern  . Not on file  Social History Narrative   Drink tea daily and coffee on occ   Right handed   1 story    Lives with spouse   Social Determinants of Health   Financial Resource Strain:   . Difficulty of Paying Living Expenses: Not on file  Food Insecurity:   . Worried About Charity fundraiser in Mall[place] Last Year: Not on file  . Ran Out of Food in Mallory Last Year: Not on file  Transportation Needs:   . Lack of Transportation (Medical): Not on file  . Lack of Transportation (Non-Medical): Not on file  Physical Activity:   . Days of Exercise per Week: Not on file  . Minutes of Exercise per Session: Not on file  Stress:   . Feeling of Stress : Not on file  Social Connections:   . Frequency of Communication with Friends and Family: Not on file  . Frequency of Social Gatherings with Friends and Family: Not on file  . Attends Religious Services: Not on file  . Active Member of Clubs or Organizations: Not on file  . Attends Archivist Meetings: Not on file  . Marital Status: Not on file    Mallory Klein Allergies Are:  Allergies  Allergen Reactions  . Ace Inhibitors     Klein doesn't recall reaction.  jkl  . Benicar  [Olmesartan]     It made Mallory Klein sick  . Aspirin Other (See Comments)    bruising  . Citalopram Hydrobromide Diarrhea  :   Mallory Klein Current Medications Are:  Outpatient Encounter Medications as of 11/22/2019  Medication Sig  . albuterol (VENTOLIN HFA) 108 (90 Base) MCG/ACT inhaler Inhale 2 puffs into Mallory lungs every 6 (six) hours as needed for wheezing or shortness of breath.  . baclofen (LIORESAL) 10 MG tablet Take 5 mg by mouth 3 (three) times daily.  . buprenorphine (BUTRANS) 5 MCG/HR PTWK 1 patch once a week. 7.5 mg patch  . carbidopa-levodopa (SINEMET CR) 50-200 MG tablet Take 1 pill 3 times daily at 11 AM, 5 PM and 10 PM daily.  . celecoxib (CELEBREX) 200 MG capsule Take 1 capsule (200 mg total) by mouth 2 (two) times daily as needed for moderate pain.  . cetirizine (ZYRTEC) 10 MG tablet TAKE 1 TABLET BY MOUTH EVERY DAY  . Cholecalciferol (VITAMIN D3) 50 MCG (2000 UT) capsule Take 1 capsule (2,000 Units total) by mouth daily.  . cyanocobalamin (,VITAMIN B-12,) 1000 MCG/ML injection Inject 1 mL (1,000 mcg total) into Mallory skin every 14 (fourteen) days.  . diclofenac Sodium (VOLTAREN) 1 % GEL Apply 4 g topically daily.  [name] docusate sodium (COLACE) 100 MG capsule SMARTSIG:1 Tablet(s) By Mouth Twice Daily  . donepezil (ARICEPT) 5 MG tablet TAKE 1 TABLET BY MOUTH AT  BEDTIME  . fluocinonide ointment (LIDEX) 3.22 % Apply 1 application topically 2 (two) times daily.  . hydrOXYzine (ATARAX/VISTARIL) 25 MG tablet Take 1 tablet (25 mg total) by mouth every 8 (eight) hours as needed for itching (itching from Butrans patch).  . IRON PO Take 1 tablet by mouth daily.  . montelukast (SINGULAIR) 10 MG tablet Take 1 tablet (10 mg total) by mouth daily.  . pantoprazole (PROTONIX) 40 MG tablet Take 1 tablet (40 mg total) by mouth daily.  . temazepam (RESTORIL)  15 MG capsule Take 1-2 capsules (15-30 mg total) by mouth at bedtime as needed for sleep.  [name] venlafaxine XR (EFFEXOR-XR) 150 MG 24 hr capsule TAKE 1 CAPSULE  BY MOUTH  EVERY DAY WITH BREAKFAST  . verapamil (VERELAN PM) 120 MG 24 hr capsule TAKE 1 CAPSULE (120 MG TOTAL) BY MOUTH AT BEDTIME.  . [DISCONTINUED] carbidopa-levodopa (SINEMET CR) 50-200 MG tablet Take 1 tablet by mouth at bedtime.  . [DISCONTINUED] Carbidopa-Levodopa ER (SINEMET CR) 25-100 MG tablet controlled release Take 2 tablets at 10AM, 2 at 2PM, 1 at [facility]   No facility-administered encounter medications on file as of 11/22/2019.  :   Review of Systems:  Out of a complete 14 point review of systems, all are reviewed and negative with Mallory exception of these symptoms as listed below: Review of Systems  Neurological:       Here for consult on PD. Pt reports Mallory Klein was dx 3 years ago and has been seeing [name]. Pt wanted to transition Mallory Klein care to our office for Mallory Klein PD.     Objective:  Neurological Exam  Physical Exam Physical Examination:   Vitals:   11/22/19 1448  BP: (!) 144/84  Pulse: 78  SpO2: 93%   General Examination: Mallory Klein is a very pleasant 74 y.o. female in no acute distress. Mallory Klein appears well-developed and well-nourished and well groomed.   HEENT: Normocephalic, atraumatic, pupils are equal, round and reactive to light, extraocular tracking is quite well preserved, no nystagmus, Mallory Klein is status post cataract repairs, corrective eyeglasses in place.  Mallory Klein has mild nuchal rigidity, a intermittent side-to-side head tremor.  Hearing is grossly intact.  Face is symmetric, perhaps mild facial masking noted.  Mallory Klein has a slight voice tremor at times, no obvious dysarthria, mild hypophonia.  No carotid bruits.  Airway examination reveals mild mouth dryness, tongue protrudes centrally in palate elevates symmetrically.   Chest: Clear to auscultation without wheezing, rhonchi or crackles noted.  Heart: S1+S2+0, regular and normal without murmurs, rubs or gallops noted.   Abdomen: Soft, non-tender and non-distended with normal bowel sounds appreciated on  auscultation.  Extremities: There is trace pitting edema in Mallory distal lower extremities bilaterally. Pedal pulses are intact.  Skin: Warm and dry without trophic changes noted.  Musculoskeletal: exam reveals low back pain, increase in lumbar kyphosis, and equal hip height, right leg shorter than left, pain in both knees, right more than left, left genu valgus.   Neurologically:  Mental status: Mallory Klein is awake, alert and oriented in all 4 spheres. Mallory Klein immediate and remote memory, attention, language skills and fund of knowledge are mildly impaired.  Mallory Klein husband provides a little bit additional information.   Mood is normal and affect is normal.   Cranial nerves II - XII are as described above under HEENT exam. In addition: shoulder shrug is normal with equal shoulder height noted. Motor exam: Normal bulk, strength and tone is noted. There is no drift, mild postural tremor in Mallory right more than left upper extremity, no significant action tremor.   Romberg is not tested for safety concerns. Reflexes are 1+ in Mallory upper extremities and trace in Mallory knees, absent in Mallory ankles.  Fine motor skills and coordination: Mallory Klein has moderate difficulty with finger taps and hand movements as well as rapid alternating patting on Mallory right side, mild difficulty on Mallory left.  Foot agility is mild to moderately impaired on Mallory right and mildly so on Mallory left.    Mallory Klein  has mild increase in tone with slight cogwheeling noted in Mallory right upper extremity only.  Mallory Klein has a intermittent mild to moderate resting tremor in Mallory right upper extremity, intermittent slight to mild resting tremor in Mallory left upper extremity, fairly consistent mild resting tremor in Mallory right lower extremity.   Cerebellar testing: No dysmetria or intention tremor on finger to nose testing. Heel to shin is not requested secondary to pain in both knees.    Sensory exam: intact to light touch in Mallory upper and lower extremities.  Gait, station  and balance: Mallory Klein stands with mild difficulty.  Posture is stooped but also confounded secondary to back pain and increase in lumbar kyphosis.  Mallory Klein has unequal shoulder height as well, and equal hip height as well.  Mallory Klein walks with Mallory Klein rolling walker, Mallory Klein is able to walk a little bit without Mallory walker, has difficulty walking, no obvious shuffling but decreased arm swing on Mallory right more than left noted.  Balance is mildly impaired.  Assessment and Plan:   In summary, NABA SNEED is a very pleasant 74 y.o.-year old female with an underlying medical history of tremors, sleep apnea, iron deficiency anemia, vitamin D deficiency, Low back pain, with history of lumbar degenerative spine disease, followed by pain management, reflux disease, hypertension, diverticulosis, asthma, and mild obesity, who presents for evaluation of Mallory Klein tremor disorder, and diagnosis of parkinsonism.  Mallory Klein history and examination are in keeping with right-sided predominant, tremor predominant Parkinson's disease.  Mallory Klein head tremor at times seen on exam today is often seen with essential tremor as well as Mallory Klein voice tremor.  Interestingly, Mallory Klein does have a family history of tremor, possibly Parkinson's disease in Mallory Klein father, unclear.  Nevertheless, Mallory Klein findings are supportive of underlying parkinsonism and Mallory Klein has been on levodopa therapy.  Currently, Mallory Klein is on 2 different strengths of long acting carbidopa-levodopa.  Mallory Klein has had trouble adhering to a schedule, in part related to Mallory Klein stomach sensitivity but also tied in with Mallory Klein difficulty maintaining a good sleep schedule.  Mallory Klein has recently started temazepam which Mallory Klein has found helpful.  Mallory Klein is advised to consider switching over to Sinemet CR 50-200 mg strength altogether and skip taking Mallory 25-100 mg strength in Mallory hope to streamline Mallory Klein medication regimen.  To that end, I suggest that Mallory Klein start Sinemet CR 50-200 mg strength 1 pill 3 times a day.  I originally asked Mallory Klein to take it 4 times  a day but Mallory Klein was reluctant.  Mallory Klein is agreeable to taking 1 pill 3 times a day at 11 AM, 5 PM and 10 PM daily for now.  We may have to bump it up at Mallory next visit.  Mallory Klein is advised to stop Mallory Sinemet CR 25-100 mg strength at this time.  I provided a new prescription which we will send to Mallory Klein mail order pharmacy and Mallory Klein was given detailed written instructions.  Mallory Klein is advised to use Mallory Klein walker at all times.  Mallory Klein gait disorder is complicated by underlying lumbar spine disease, arthritis, prior injuries, and equal hip height and leg length, arthritis with status post right total knee replacement and left knee pain.  Mallory Klein is advised to follow-up in 3 months, sooner if needed.  Answered all their questions today and Mallory Klein and Mallory Klein husband were in agreement.   Thank you very much for allowing me to participate in Mallory care of this nice Klein. If I can be of any further assistance to you  please do not hesitate to call me at [phone].  Sincerely,   Star Age, MD, PhD

## 2019-11-24 ENCOUNTER — Telehealth: Payer: Self-pay | Admitting: Internal Medicine

## 2019-11-24 DIAGNOSIS — L2084 Intrinsic (allergic) eczema: Secondary | ICD-10-CM

## 2019-11-24 NOTE — Telephone Encounter (Signed)
Patient called and is requesting medication refills on the following medications  montelukast (SINGULAIR) 10 MG tablet  donepezil (ARICEPT) 5 MG tablet  pantoprazole (PROTONIX) 40 MG tablet   They need to be sent to [facility], [place], [address]

## 2019-11-27 MED ORDER — PANTOPRAZOLE SODIUM 40 MG PO TBEC
40.0000 mg | DELAYED_RELEASE_TABLET | Freq: Every day | ORAL | 11 refills | Status: DC
Start: 2019-11-27 — End: 2020-03-27

## 2019-11-27 MED ORDER — DONEPEZIL HCL 5 MG PO TABS
5.0000 mg | ORAL_TABLET | Freq: Every day | ORAL | 3 refills | Status: DC
Start: 2019-11-27 — End: 2020-07-23

## 2019-11-27 MED ORDER — MONTELUKAST SODIUM 10 MG PO TABS: 10.0000 mg | ORAL_TABLET | Freq: Every day | ORAL | 3 refills | Status: DC

## 2019-11-27 NOTE — Telephone Encounter (Signed)
Rx sent 

## 2019-12-12 DIAGNOSIS — G894 Chronic pain syndrome: Secondary | ICD-10-CM | POA: Diagnosis not present

## 2019-12-12 DIAGNOSIS — M1712 Unilateral primary osteoarthritis, left knee: Secondary | ICD-10-CM | POA: Diagnosis not present

## 2019-12-12 DIAGNOSIS — M47816 Spondylosis without myelopathy or radiculopathy, lumbar region: Secondary | ICD-10-CM | POA: Diagnosis not present

## 2019-12-12 DIAGNOSIS — M48061 Spinal stenosis, lumbar region without neurogenic claudication: Secondary | ICD-10-CM | POA: Diagnosis not present

## 2019-12-13 ENCOUNTER — Other Ambulatory Visit: Payer: Self-pay

## 2019-12-13 ENCOUNTER — Ambulatory Visit (INDEPENDENT_AMBULATORY_CARE_PROVIDER_SITE_OTHER): Payer: Medicare Other | Admitting: Internal Medicine

## 2019-12-13 ENCOUNTER — Encounter: Payer: Self-pay | Admitting: Internal Medicine

## 2019-12-13 ENCOUNTER — Other Ambulatory Visit (INDEPENDENT_AMBULATORY_CARE_PROVIDER_SITE_OTHER): Payer: Medicare Other

## 2019-12-13 VITALS — BP 142/78 | HR 81 | Temp 98.5°F | Ht 61.5 in | Wt 175.8 lb

## 2019-12-13 DIAGNOSIS — R739 Hyperglycemia, unspecified: Secondary | ICD-10-CM

## 2019-12-13 DIAGNOSIS — Z23 Encounter for immunization: Secondary | ICD-10-CM

## 2019-12-13 DIAGNOSIS — R413 Other amnesia: Secondary | ICD-10-CM

## 2019-12-13 DIAGNOSIS — E559 Vitamin D deficiency, unspecified: Secondary | ICD-10-CM

## 2019-12-13 DIAGNOSIS — M5440 Lumbago with sciatica, unspecified side: Secondary | ICD-10-CM

## 2019-12-13 DIAGNOSIS — G20 Parkinson's disease: Secondary | ICD-10-CM

## 2019-12-13 DIAGNOSIS — G8929 Other chronic pain: Secondary | ICD-10-CM

## 2019-12-13 DIAGNOSIS — F19982 Other psychoactive substance use, unspecified with psychoactive substance-induced sleep disorder: Secondary | ICD-10-CM

## 2019-12-13 MED ORDER — BUPRENORPHINE 7.5 MCG/HR TD PTWK
1.0000 | MEDICATED_PATCH | TRANSDERMAL | 0 refills | Status: DC
Start: 2019-12-13 — End: 2020-03-08

## 2019-12-13 MED ORDER — TRIAZOLAM 0.25 MG PO TABS: 0.2500 mg | ORAL_TABLET | Freq: Every evening | ORAL | 3 refills | Status: DC | PRN

## 2019-12-13 NOTE — Assessment & Plan Note (Signed)
Temazepam  d/c Try Halcion prn

## 2019-12-13 NOTE — Assessment & Plan Note (Signed)
Wt Readings from Last 3 Encounters:  12/13/19 175 lb 12.8 oz (79.7 kg)  11/22/19 173 lb (78.5 kg)  09/11/19 170 lb (77.1 kg)

## 2019-12-13 NOTE — Assessment & Plan Note (Signed)
On Butrans patches

## 2019-12-13 NOTE — Patient Instructions (Signed)
You can try Lion's Mane Mushroom extract or capsules for memory   

## 2019-12-13 NOTE — Progress Notes (Signed)
Subjective:  Patient ID: Mallory Klein, female    DOB: Dec 13, 1945  Age: 74 y.o. MRN: [id number]  CC: Follow-up   HPI Mallory Klein presents for LBP, OA, memory loss  Outpatient Medications Prior to Visit  Medication Sig Dispense Refill  . albuterol (VENTOLIN HFA) 108 (90 Base) MCG/ACT inhaler Inhale 2 puffs into the lungs every 6 (six) hours as needed for wheezing or shortness of breath. 18 g 11  . baclofen (LIORESAL) 10 MG tablet Take 5 mg by mouth 3 (three) times daily.    . buprenorphine (BUTRANS) 5 MCG/HR PTWK 1 patch once a week. 7.5 mg patch    . carbidopa-levodopa (SINEMET CR) 50-200 MG tablet Take 1 pill 3 times daily at 11 AM, 5 PM and 10 PM daily. 270 tablet 3  . celecoxib (CELEBREX) 200 MG capsule Take 1 capsule (200 mg total) by mouth 2 (two) times daily as needed for moderate pain. 60 capsule 0  . cetirizine (ZYRTEC) 10 MG tablet TAKE 1 TABLET BY MOUTH EVERY DAY 90 tablet 3  . Cholecalciferol (VITAMIN D3) 50 MCG (2000 UT) capsule Take 1 capsule (2,000 Units total) by mouth daily. 100 capsule 3  . cyanocobalamin (,VITAMIN B-12,) 1000 MCG/ML injection Inject 1 mL (1,000 mcg total) into the skin every 14 (fourteen) days. 10 mL 4  . diclofenac Sodium (VOLTAREN) 1 % GEL Apply 4 g topically daily.    [name] docusate sodium (COLACE) 100 MG capsule SMARTSIG:1 Tablet(s) By Mouth Twice Daily    . donepezil (ARICEPT) 5 MG tablet Take 1 tablet (5 mg total) by mouth at bedtime. 90 tablet 3  . fluocinonide ointment (LIDEX) 2.59 % Apply 1 application topically 2 (two) times daily. 60 g 1  . hydrOXYzine (ATARAX/VISTARIL) 25 MG tablet Take 1 tablet (25 mg total) by mouth every 8 (eight) hours as needed for itching (itching from Butrans patch). 60 tablet 0  . IRON PO Take 1 tablet by mouth daily.    . montelukast (SINGULAIR) 10 MG tablet Take 1 tablet (10 mg total) by mouth daily. 90 tablet 3  . pantoprazole (PROTONIX) 40 MG tablet Take 1 tablet (40 mg total) by mouth daily. 30 tablet 11    . temazepam (RESTORIL) 15 MG capsule Take 1-2 capsules (15-30 mg total) by mouth at bedtime as needed for sleep. 60 capsule 3  . venlafaxine XR (EFFEXOR-XR) 150 MG 24 hr capsule TAKE 1 CAPSULE BY MOUTH  EVERY DAY WITH BREAKFAST 90 capsule 3  . verapamil (VERELAN PM) 120 MG 24 hr capsule TAKE 1 CAPSULE (120 MG TOTAL) BY MOUTH AT BEDTIME. 90 capsule 3   No facility-administered medications prior to visit.    ROS: Review of Systems  Constitutional: Negative for activity change, appetite change, chills, fatigue and unexpected weight change.  HENT: Negative for congestion, mouth sores and sinus pressure.   Eyes: Negative for visual disturbance.  Respiratory: Negative for cough and chest tightness.   Gastrointestinal: Negative for abdominal pain and nausea.  Genitourinary: Negative for difficulty urinating, frequency and vaginal pain.  Musculoskeletal: Positive for arthralgias, back pain and gait problem.  Skin: Negative for pallor and rash.  Neurological: Negative for dizziness, tremors, weakness, numbness and headaches.  Psychiatric/Behavioral: Positive for decreased concentration. Negative for confusion, sleep disturbance and suicidal ideas.    Objective:  BP (!) 142/78 (BP Location: Right Arm, Patient Position: Sitting, Cuff Size: Large)   Pulse 81   Temp 98.5 F (36.9 C) (Oral)   Ht 5' 1.5" (1.562 m)  Wt 175 lb 12.8 oz (79.7 kg)   SpO2 96%   BMI 32.68 kg/m   BP Readings from Last 3 Encounters:  12/13/19 (!) 142/78  11/22/19 (!) 144/84  09/11/19 (!) 150/88    Wt Readings from Last 3 Encounters:  12/13/19 175 lb 12.8 oz (79.7 kg)  11/22/19 173 lb (78.5 kg)  09/11/19 170 lb (77.1 kg)    Physical Exam Constitutional:      General: She is not in acute distress.    Appearance: She is well-developed.  HENT:     Head: Normocephalic.     Right Ear: External ear normal.     Left Ear: External ear normal.     Nose: Nose normal.  Eyes:     General:        Right eye: No  discharge.        Left eye: No discharge.     Conjunctiva/sclera: Conjunctivae normal.     Pupils: Pupils are equal, round, and reactive to light.  Neck:     Thyroid: No thyromegaly.     Vascular: No JVD.     Trachea: No tracheal deviation.  Cardiovascular:     Rate and Rhythm: Normal rate and regular rhythm.     Heart sounds: Normal heart sounds.  Pulmonary:     Effort: No respiratory distress.     Breath sounds: No stridor. No wheezing.  Abdominal:     General: Bowel sounds are normal. There is no distension.     Palpations: Abdomen is soft. There is no mass.     Tenderness: There is no abdominal tenderness. There is no guarding or rebound.  Musculoskeletal:        General: Tenderness present.     Cervical back: Normal range of motion and neck supple.  Lymphadenopathy:     Cervical: No cervical adenopathy.  Skin:    Findings: No erythema or rash.  Neurological:     Cranial Nerves: No cranial nerve deficit.     Motor: No abnormal muscle tone.     Coordination: Coordination abnormal.     Gait: Gait abnormal.     Deep Tendon Reflexes: Reflexes normal.  Psychiatric:        Behavior: Behavior normal.        Thought Content: Thought content normal.        Judgment: Judgment normal.   walker LS w/pain  Lab Results  Component Value Date   WBC 8.5 09/11/2019   HGB 13.8 09/11/2019   HCT 42.7 09/11/2019   PLT 361 09/11/2019   GLUCOSE 93 09/11/2019   CHOL 219 (H) 02/05/2015   TRIG 69.0 02/05/2015   HDL 73.70 02/05/2015   LDLDIRECT 109.8 02/10/2010   LDLCALC 132 (H) 02/05/2015   ALT 3 06/12/2019   AST 14 06/12/2019   NA 145 09/11/2019   K 4.3 09/11/2019   CL 104 09/11/2019   CREATININE 0.91 09/11/2019   BUN 13 09/11/2019   CO2 35 (H) 09/11/2019   TSH 0.82 02/12/2017   INR 0.97 02/07/2014   HGBA1C 6.2 02/12/2017    CT Abdomen Pelvis W Contrast  Result Date: 02/28/2018 CLINICAL DATA:  Abdominal pain EXAM: CT ABDOMEN AND PELVIS WITH CONTRAST TECHNIQUE: Multidetector  CT imaging of the abdomen and pelvis was performed using the standard protocol following bolus administration of intravenous contrast. CONTRAST:  166mL ISOVUE-300 IOPAMIDOL (ISOVUE-300) INJECTION 61% COMPARISON:  None. FINDINGS: LOWER CHEST: There is no basilar pleural or apical pericardial effusion. HEPATOBILIARY: The hepatic contours and  density are normal. There is no intra- or extrahepatic biliary dilatation. The gallbladder is normal. PANCREAS: The pancreatic parenchymal contours are normal and there is no ductal dilatation. There is no peripancreatic fluid collection. SPLEEN: Normal. ADRENALS/URINARY TRACT: --Adrenal glands: Normal. --Right kidney/ureter: No hydronephrosis, nephroureterolithiasis, perinephric stranding or solid renal mass. --Left kidney/ureter: No hydronephrosis, nephroureterolithiasis, perinephric stranding or solid renal mass. --Urinary bladder: Normal for degree of distention STOMACH/BOWEL: --Stomach/Duodenum: There is no hiatal hernia or other gastric abnormality. The duodenal course and caliber are normal. --Small bowel: No dilatation or inflammation. --Colon: No focal abnormality. --Appendix: Normal. VASCULAR/LYMPHATIC: There is aortic atherosclerosis without hemodynamically significant stenosis. The portal vein, splenic vein, superior mesenteric vein and IVC are patent. No abdominal or pelvic lymphadenopathy. REPRODUCTIVE: Status post hysterectomy. No adnexal mass. MUSCULOSKELETAL. Multilevel degenerative disc disease and facet arthrosis. No bony spinal canal stenosis. OTHER: None. IMPRESSION: No acute abdominal or pelvic abnormality. Electronically Signed   By: Ulyses Jarred M.D.   On: 02/28/2018 18:50    Assessment & Plan:     Walker Kehr, MD

## 2019-12-13 NOTE — Assessment & Plan Note (Signed)
On Aricept Try [place]'s [place] Mushroom extract or capsules for memory

## 2019-12-13 NOTE — Assessment & Plan Note (Signed)
F/u w/Dr Athar 

## 2019-12-13 NOTE — Assessment & Plan Note (Signed)
A1c

## 2019-12-13 NOTE — Assessment & Plan Note (Signed)
Vit D 

## 2019-12-14 LAB — HEMOGLOBIN A1C: Hgb A1c MFr Bld: 5.9 % (ref 4.6–6.5)

## 2020-01-10 ENCOUNTER — Ambulatory Visit: Payer: Medicare Other | Admitting: Nurse Practitioner

## 2020-01-10 ENCOUNTER — Encounter: Payer: Self-pay | Admitting: Nurse Practitioner

## 2020-01-10 VITALS — BP 120/70 | HR 91 | Ht 61.5 in | Wt 172.0 lb

## 2020-01-10 DIAGNOSIS — K5903 Drug induced constipation: Secondary | ICD-10-CM | POA: Diagnosis not present

## 2020-01-10 NOTE — Patient Instructions (Signed)
If you are age 74 or older, your body mass index should be between 23-30. Your Body mass index is 31.97 kg/m. If this is out of the aforementioned range listed, please consider follow up with your Primary Care Provider.  If you are age 74 or younger, your body mass index should be between 19-25. Your Body mass index is 31.97 kg/m. If this is out of the aformentioned range listed, please consider follow up with your Primary Care Provider.   Start Miralax 1 capful in 8 ounces of water every 8 hours until bowel movement, then decrease to once daily.  Continue stool softeners.   Call back on Monday if not improving.

## 2020-01-10 NOTE — Progress Notes (Signed)
ASSESSMENT AND PLAN    # 74 yo old female with one month history of constipation, probably multifactorial ( inadequate water intake, Parkinson's disease and multiple medication with potential to cause constipation.   --Make an effort to increase water intake.  --Continue two stool softeners at night.  --Slow bowel purge with a capful of Miralax in 8 oz of water TID. Once bowels start moving she can decrease dose to once daily.  --On oral iron, followed by Hematology. CBC has been normal for over a year. Patient to talk with Hematology about holding iron which may help with constipation.  --She will call office if no improvement with above regimen. Otherwise, follow up prn   # Hx of adenomatous colon polyps. Three tubular adenomas ( 2-8 mm in size) removed at time of last colonoscopy July 2020.    HISTORY OF PRESENT ILLNESS     Primary Gastroenterologist : Scarlette Shorts, MD   Chief Complaint : constipation  Mallory Klein is a 74 y.o. female with PMH / [place] significant for,  but not necessarily limited to: chronic GERD, colon polyps, diverticulosis, Parkinson's disease, osteoarthritis, HTN  Patient is here for evaluation of constipaiton. She has Parkinson's disease, on Sinemet. She is taking other medications with potential to cause constipation such as iron, CCB, and a pain patch started several months ago. She has been taking 2 Colace a day and had no problems with constipation until a month ago. She gave herself a Fleet's enema without any results.  She has taken Mg+ citrate a couple of times with good results. She is really in distress over this development of constipation.    Previous Endoscopic Evaluations / Pertinent Studies:  Feb 2020 colonoscopy  Four 2 to 8 mm polyps at the recto-sigmoid colon, in the descending colon and in the ascending colon, removed with a cold snare. Resected and retrieved. - Diverticulosis in the left colon. - Internal hemorrhoids. - The  examination was otherwise normal on direct and retroflexion views.  Path = tubular adenomas x 3  Past Medical History:  Diagnosis Date   Abnormal CBC 02/05/2015   [name] 12/16 new: normocytic anemia and thrombocytosis   Adjustment disorder with mixed anxiety and depressed mood 02/15/2007   Chronic  Chronic pain Grief, stress Effexor XR  Dad died in 2023/03/06   Asthma    Diverticulosis of colon (without mention of hemorrhage)    Esophageal stricture    Essential hypertension 01/26/2007   Chronic Verapamil    GERD (gastroesophageal reflux disease) 02/15/2007   Chronic     Heart murmur    Hyperglycemia 03/02/2011   Mild     Insomnia disorder 12/08/2018   10/20 Carbid/Lev dose was increased - c/o hard time falling asleep (6 am) getting up at 12 am, poor sleep. Try Temazepam 15-30 mg at 11-1:30 pm    Internal hemorrhoids without mention of complication    Iron deficiency anemia 04/23/2017   Mild neurocognitive disorder, likely due to Parkinson's disease 02/09/2019   OSA (obstructive sleep apnea) 02/14/2010   In lab study (May 2018): AHI 25. Only had 45 minutes of sleep secondary to frequent awakenings secondary to sleep apnea. autocpap 5-15 cm water.    Osteoarthritis    Parkinson disease ([place]) 03/05/2014   2015 2017  Primidone - d/c 2019 Parkinson's: Sinmet IR. Dr Tat   Postherpetic trigeminal neuralgia 01/26/2007   Qualifier: Diagnosis of  By: Marca Ancona RMA, [name]     Shortness of breath dyspnea  with allergies   Thrombocytosis 05/24/2017   Urinary incontinence 01/26/2007   Chronic  10/17 Detrol LA    Vitamin D deficiency 01/26/2019    Current Medications, Allergies, Past Surgical History, Family History and Social History were reviewed in [employer] record.   Current Outpatient Medications  Medication Sig Dispense Refill   albuterol (VENTOLIN HFA) 108 (90 Base) MCG/ACT inhaler Inhale 2 puffs into the lungs every 6 (six) hours as needed for  wheezing or shortness of breath. 18 g 11   buprenorphine (BUTRANS) 7.5 MCG/HR Place 1 patch onto the skin once a week. 4 patch 0   carbidopa-levodopa (SINEMET CR) 50-200 MG tablet Take 1 pill 3 times daily at 11 AM, 5 PM and 10 PM daily. 270 tablet 3   celecoxib (CELEBREX) 200 MG capsule Take 1 capsule (200 mg total) by mouth 2 (two) times daily as needed for moderate pain. 60 capsule 0   cetirizine (ZYRTEC) 10 MG tablet TAKE 1 TABLET BY MOUTH EVERY DAY 90 tablet 3   Cholecalciferol (VITAMIN D3) 50 MCG (2000 UT) capsule Take 1 capsule (2,000 Units total) by mouth daily. 100 capsule 3   cyanocobalamin (,VITAMIN B-12,) 1000 MCG/ML injection Inject 1 mL (1,000 mcg total) into the skin every 14 (fourteen) days. 10 mL 4   diclofenac Sodium (VOLTAREN) 1 % GEL Apply 4 g topically daily.     docusate sodium (COLACE) 100 MG capsule SMARTSIG:1 Tablet(s) By Mouth Twice Daily     fluocinonide ointment (LIDEX) 0.25 % Apply 1 application topically 2 (two) times daily. 60 g 1   hydrOXYzine (ATARAX/VISTARIL) 25 MG tablet Take 1 tablet (25 mg total) by mouth every 8 (eight) hours as needed for itching (itching from Butrans patch). 60 tablet 0   IRON PO Take 1 tablet by mouth daily.     montelukast (SINGULAIR) 10 MG tablet Take 1 tablet (10 mg total) by mouth daily. 90 tablet 3   pantoprazole (PROTONIX) 40 MG tablet Take 1 tablet (40 mg total) by mouth daily. 30 tablet 11   triazolam (HALCION) 0.25 MG tablet Take 1-2 tablets (0.25-0.5 mg total) by mouth at bedtime as needed for sleep. 60 tablet 3   verapamil (VERELAN PM) 120 MG 24 hr capsule TAKE 1 CAPSULE (120 MG TOTAL) BY MOUTH AT BEDTIME. 90 capsule 3   baclofen (LIORESAL) 10 MG tablet Take 5 mg by mouth 3 (three) times daily. (Patient not taking: Reported on 01/10/2020)     donepezil (ARICEPT) 5 MG tablet Take 1 tablet (5 mg total) by mouth at bedtime. (Patient not taking: Reported on 01/10/2020) 90 tablet 3   venlafaxine XR (EFFEXOR-XR) 150 MG  24 hr capsule TAKE 1 CAPSULE BY MOUTH  EVERY DAY WITH BREAKFAST (Patient not taking: Reported on 01/10/2020) 90 capsule 3   No current facility-administered medications for this visit.    Review of Systems: No chest pain. No shortness of breath. No urinary complaints.   PHYSICAL EXAM :    Wt Readings from Last 3 Encounters:  01/10/20 172 lb (78 kg)  12/13/19 175 lb 12.8 oz (79.7 kg)  11/22/19 173 lb (78.5 kg)    BP 120/70    Pulse 91    Ht 5' 1.5" (1.562 m)    Wt 172 lb (78 kg)    SpO2 97%    BMI 31.97 kg/m  Constitutional:  Pleasant  female in no acute distress. In a wheelchair.  Psychiatric: Normal mood and affect. Behavior is normal. EENT: Pupils normal.  Conjunctivae are normal. No scleral icterus. Neck supple.  Cardiovascular: Normal rate, regular rhythm. No edema Pulmonary/chest: Effort normal and breath sounds normal. No wheezing, rales or rhonchi. Abdominal: Limited exam in wheelchair. Abdomen is soft, nondistended, nontender. Bowel sounds active  Neurological: Alert and oriented to person place and time.   Tye Savoy, NP  01/10/2020, 3:21 PM

## 2020-01-11 DIAGNOSIS — G894 Chronic pain syndrome: Secondary | ICD-10-CM | POA: Diagnosis not present

## 2020-01-11 DIAGNOSIS — M47816 Spondylosis without myelopathy or radiculopathy, lumbar region: Secondary | ICD-10-CM | POA: Diagnosis not present

## 2020-01-11 DIAGNOSIS — M48061 Spinal stenosis, lumbar region without neurogenic claudication: Secondary | ICD-10-CM | POA: Diagnosis not present

## 2020-01-11 DIAGNOSIS — M1712 Unilateral primary osteoarthritis, left knee: Secondary | ICD-10-CM | POA: Diagnosis not present

## 2020-01-11 NOTE — Progress Notes (Signed)
Noted  

## 2020-02-08 DIAGNOSIS — M48061 Spinal stenosis, lumbar region without neurogenic claudication: Secondary | ICD-10-CM | POA: Diagnosis not present

## 2020-02-08 DIAGNOSIS — M47816 Spondylosis without myelopathy or radiculopathy, lumbar region: Secondary | ICD-10-CM | POA: Diagnosis not present

## 2020-02-08 DIAGNOSIS — G894 Chronic pain syndrome: Secondary | ICD-10-CM | POA: Diagnosis not present

## 2020-02-08 DIAGNOSIS — M1712 Unilateral primary osteoarthritis, left knee: Secondary | ICD-10-CM | POA: Diagnosis not present

## 2020-02-26 ENCOUNTER — Ambulatory Visit: Payer: Medicare Other | Admitting: Neurology

## 2020-03-01 ENCOUNTER — Encounter: Payer: Self-pay | Admitting: Physical Therapy

## 2020-03-01 NOTE — Therapy (Signed)
[facility] [address] [place], [place], [address] Phone: [phone]   Fax:  [phone]  Patient Details  Name: Mallory Klein MRN: [id number] Date of Birth: 03/01/1945 Referring Provider:  No ref. provider found  Encounter Date: 03/01/2020  PHYSICAL THERAPY DISCHARGE SUMMARY  Visits from Start of Care: 1  Current functional level related to goals / functional outcomes: Unable to assess goals due to pt not returning.    Remaining deficits: See last treatment/eval note - not able to assess goals.     Plan: Patient agrees to discharge.  Patient goals were not met. Patient is being discharged due to not returning since the last visit.  ?????       Arliss Journey, PT, DPT  03/01/2020, 12:05 PM  [facility] [address] [place], [place], [address] Phone: [phone]   Fax:  [phone]

## 2020-03-07 ENCOUNTER — Telehealth: Payer: Self-pay | Admitting: Internal Medicine

## 2020-03-07 NOTE — Telephone Encounter (Signed)
Patients insurance changed and the pain management place no longer accepts her insurance, she is in the process of getting her insurance to find a new place she can go but she will need to change her pain patch on Saturday and she has no way to get it so she was wondering if Dr. Alain Marion could send this in for her while shes in between places.  Butrenorphine Transdermal System 7.5 mcg per hour  [id number] [place], [place] - [address] AT [place] Phone:  [phone]  Fax:  [phone]     [facility] called [facility] and they dont usually [facility] so they would have to order it and it wouldn't get to the store until tomorrow.  Last seen- 10.20.21

## 2020-03-08 MED ORDER — BUPRENORPHINE 7.5 MCG/HR TD PTWK
1.0000 | MEDICATED_PATCH | TRANSDERMAL | 0 refills | Status: DC
Start: 2020-03-08 — End: 2020-07-23

## 2020-03-08 NOTE — Telephone Encounter (Signed)
Mallory Klein needs office visit every 3 months. Thanks

## 2020-03-08 NOTE — Telephone Encounter (Signed)
Notified pt w/MD response.Pt states she is going to change her insurance back to her previously insurance. She has appt already scheduled for 03/14/20...[name]

## 2020-03-14 ENCOUNTER — Ambulatory Visit: Payer: Medicare Other | Admitting: Internal Medicine

## 2020-03-26 ENCOUNTER — Other Ambulatory Visit: Payer: Self-pay

## 2020-03-27 ENCOUNTER — Ambulatory Visit (INDEPENDENT_AMBULATORY_CARE_PROVIDER_SITE_OTHER): Payer: Medicare Other

## 2020-03-27 ENCOUNTER — Ambulatory Visit (INDEPENDENT_AMBULATORY_CARE_PROVIDER_SITE_OTHER): Payer: Medicare Other | Admitting: Internal Medicine

## 2020-03-27 ENCOUNTER — Encounter: Payer: Self-pay | Admitting: Internal Medicine

## 2020-03-27 VITALS — BP 150/80 | HR 83 | Temp 98.3°F | Ht 62.0 in | Wt 169.6 lb

## 2020-03-27 DIAGNOSIS — G8929 Other chronic pain: Secondary | ICD-10-CM

## 2020-03-27 DIAGNOSIS — M5440 Lumbago with sciatica, unspecified side: Secondary | ICD-10-CM

## 2020-03-27 DIAGNOSIS — E538 Deficiency of other specified B group vitamins: Secondary | ICD-10-CM

## 2020-03-27 DIAGNOSIS — G3184 Mild cognitive impairment, so stated: Secondary | ICD-10-CM | POA: Diagnosis not present

## 2020-03-27 DIAGNOSIS — F4320 Adjustment disorder, unspecified: Secondary | ICD-10-CM | POA: Insufficient documentation

## 2020-03-27 DIAGNOSIS — F4321 Adjustment disorder with depressed mood: Secondary | ICD-10-CM

## 2020-03-27 DIAGNOSIS — F19982 Other psychoactive substance use, unspecified with psychoactive substance-induced sleep disorder: Secondary | ICD-10-CM

## 2020-03-27 DIAGNOSIS — E559 Vitamin D deficiency, unspecified: Secondary | ICD-10-CM | POA: Diagnosis not present

## 2020-03-27 DIAGNOSIS — Z0000 Encounter for general adult medical examination without abnormal findings: Secondary | ICD-10-CM | POA: Diagnosis not present

## 2020-03-27 DIAGNOSIS — F4323 Adjustment disorder with mixed anxiety and depressed mood: Secondary | ICD-10-CM

## 2020-03-27 MED ORDER — PANTOPRAZOLE SODIUM 40 MG PO TBEC: 40.0000 mg | DELAYED_RELEASE_TABLET | Freq: Every day | ORAL | 11 refills | Status: DC

## 2020-03-27 MED ORDER — VERAPAMIL HCL ER 120 MG PO CP24: 120.0000 mg | ORAL_CAPSULE | Freq: Every day | ORAL | 11 refills | Status: DC

## 2020-03-27 MED ORDER — CELECOXIB 200 MG PO CAPS: 200.0000 mg | ORAL_CAPSULE | Freq: Two times a day (BID) | ORAL | 3 refills | Status: DC | PRN

## 2020-03-27 MED ORDER — ZOLPIDEM TARTRATE 5 MG PO TABS: 5.0000 mg | ORAL_TABLET | Freq: Every evening | ORAL | 5 refills | Status: DC | PRN

## 2020-03-27 NOTE — Assessment & Plan Note (Addendum)
Dog just died 75 yo yorkie - grieving Discussed

## 2020-03-27 NOTE — Progress Notes (Addendum)
Subjective:   Mallory Klein is a 75 y.o. female who presents for Medicare Annual (Subsequent) preventive examination.  Review of Systems    No ROS. Medicare Wellness Visit. Additional risk factors are reflected in social history. Cardiac Risk Factors include: advanced age (>65men, >14 women);family history of premature cardiovascular disease;hypertension;obesity (BMI >30kg/m2)     Objective:    Today's Vitals   03/27/20 1501  BP: (!) 150/80  Pulse: 83  Temp: 98.3 F (36.8 C)  SpO2: 97%  Weight: 169 lb 9.6 oz (76.9 kg)  Height: 5\' 2"  (1.575 m)  PainSc: 4    Body mass index is 31.02 kg/m.  Advanced Directives 03/27/2020 07/05/2019 04/20/2019 12/26/2018 11/15/2018 02/28/2018 05/24/2017  Does Patient Have a Medical Advance Directive? No No No No No No No  Would patient like information on creating a medical advance directive? No - Patient declined - - No - Patient declined No - Patient declined - -    Current Medications (verified) Outpatient Encounter Medications as of 03/27/2020  Medication Sig   albuterol (VENTOLIN HFA) 108 (90 Base) MCG/ACT inhaler Inhale 2 puffs into the lungs every 6 (six) hours as needed for wheezing or shortness of breath.   buprenorphine (BUTRANS) 7.5 MCG/HR Place 1 patch onto the skin once a week.   carbidopa-levodopa (SINEMET CR) 50-200 MG tablet Take 1 pill 3 times daily at 11 AM, 5 PM and 10 PM daily.   cetirizine (ZYRTEC) 10 MG tablet TAKE 1 TABLET BY MOUTH EVERY DAY   Cholecalciferol (VITAMIN D3) 50 MCG (2000 UT) capsule Take 1 capsule (2,000 Units total) by mouth daily.   cyanocobalamin (,VITAMIN B-12,) 1000 MCG/ML injection Inject 1 mL (1,000 mcg total) into the skin every 14 (fourteen) days.   diclofenac Sodium (VOLTAREN) 1 % GEL Apply 4 g topically daily.   docusate sodium (COLACE) 100 MG capsule SMARTSIG:1 Tablet(s) By Mouth Twice Daily   donepezil (ARICEPT) 5 MG tablet Take 1 tablet (5 mg total) by mouth at bedtime.   fluocinonide ointment  (LIDEX) 1.61 % Apply 1 application topically 2 (two) times daily.   hydrOXYzine (ATARAX/VISTARIL) 25 MG tablet Take 1 tablet (25 mg total) by mouth every 8 (eight) hours as needed for itching (itching from Butrans patch).   IRON PO Take 1 tablet by mouth daily.   montelukast (SINGULAIR) 10 MG tablet Take 1 tablet (10 mg total) by mouth daily.   venlafaxine XR (EFFEXOR-XR) 150 MG 24 hr capsule TAKE 1 CAPSULE BY MOUTH  EVERY DAY WITH BREAKFAST   [DISCONTINUED] baclofen (LIORESAL) 10 MG tablet Take 5 mg by mouth 3 (three) times daily. (Patient not taking: No sig reported)   [DISCONTINUED] celecoxib (CELEBREX) 200 MG capsule Take 1 capsule (200 mg total) by mouth 2 (two) times daily as needed for moderate pain.   [DISCONTINUED] pantoprazole (PROTONIX) 40 MG tablet Take 1 tablet (40 mg total) by mouth daily.   [DISCONTINUED] triazolam (HALCION) 0.25 MG tablet Take 1-2 tablets (0.25-0.5 mg total) by mouth at bedtime as needed for sleep.   [DISCONTINUED] verapamil (VERELAN PM) 120 MG 24 hr capsule TAKE 1 CAPSULE (120 MG TOTAL) BY MOUTH AT BEDTIME.   No facility-administered encounter medications on file as of 03/27/2020.    Allergies (verified) Ace inhibitors, Benicar [olmesartan], Aspirin, and Citalopram hydrobromide   History: Past Medical History:  Diagnosis Date   Abnormal CBC 02/05/2015   [name] 12/16 new: normocytic anemia and thrombocytosis   Adjustment disorder with mixed anxiety and depressed mood 02/15/2007  Chronic  Chronic pain Grief, stress Effexor XR  Dad died in 02/11/23   Asthma    Diverticulosis of colon (without mention of hemorrhage)    Esophageal stricture    Essential hypertension 01/26/2007   Chronic Verapamil    GERD (gastroesophageal reflux disease) 02/15/2007   Chronic     Heart murmur    Hyperglycemia 03/02/2011   Mild     Insomnia disorder 12/08/2018   10/20 Carbid/Lev dose was increased - c/o hard time falling asleep (6 am) getting up at 12 am, poor sleep. Try  Temazepam 15-30 mg at 11-1:30 pm    Internal hemorrhoids without mention of complication    Iron deficiency anemia 04/23/2017   Mild neurocognitive disorder, likely due to Parkinson's disease 02/09/2019   OSA (obstructive sleep apnea) 02/14/2010   In lab study (May 2018): AHI 25. Only had 45 minutes of sleep secondary to frequent awakenings secondary to sleep apnea. autocpap 5-15 cm water.    Osteoarthritis    Parkinson disease ([place]) February 10, 2014   2015 2017  Primidone - d/c 2019 Parkinson's: [facility] IR. Dr Tat   Postherpetic trigeminal neuralgia 01/26/2007   Qualifier: Diagnosis of  By: Marca Ancona RMA, [name]     Shortness of breath dyspnea    with allergies   Thrombocytosis 05/24/2017   Urinary incontinence 01/26/2007   Chronic  10/17 Detrol LA    Vitamin D deficiency 01/26/2019   Past Surgical History:  Procedure Laterality Date   ABDOMINAL HYSTERECTOMY     BREAST CYST EXCISION Left 1980's   LUMBAR LAMINECTOMY/DECOMPRESSION MICRODISCECTOMY Bilateral 02/20/2014   Procedure: LUMBAR TWO THREE, LUMBAR THREE FOUR LUMBAR LAMINECTOMY/DECOMPRESSION MICRODISCECTOMY 2 LEVELS;  Surgeon: Eustace Moore, MD;  Location: [facility];  Service: Neurosurgery;  Laterality: Bilateral;   SPLENECTOMY     TIBIA FRACTURE SURGERY Right    TIBIA FRACTURE SURGERY Left    TOTAL KNEE ARTHROPLASTY Right 2013   Family History  Problem Relation Age of Onset   Hypertension Mother    Diabetes type II Mother    Asthma Mother    Diabetes Mother    Dementia Mother    Prostate cancer Father    Mental illness Father        dementia   Suicidality Son    Stroke Brother        died in prison   Colon cancer Neg Hx    Rectal cancer Neg Hx    Social History   Socioeconomic History   Marital status: Married    Spouse name: Not on file   Number of children: 2   Years of education: 14   Highest education level: Futures trader degree: occupational, Hotel manager, or vocational program  Occupational History   Occupation: Retired     Comment: Network engineer, Youth worker   Tobacco Use   Smoking status: Never Smoker   Smokeless tobacco: Never Used  Scientific laboratory technician Use: Never used  Substance and Sexual Activity   Alcohol use: No    Alcohol/week: 2.0 standard drinks    Types: 2 Glasses of wine per week    Comment: wine on occ    Drug use: No   Sexual activity: Not Currently  Other Topics Concern   Not on file  Social History Narrative   Drink tea daily and coffee on occ   Right handed   1 story    Lives with spouse   Social Determinants of Health   Financial Resource Strain: Low Risk    Difficulty  of Paying Living Expenses: Not hard at all  Food Insecurity: No Food Insecurity   Worried About [place] in the Last Year: Never true   [place] in the Last Year: Never true  Transportation Needs: No Transportation Needs   Lack of Transportation (Medical): No   Lack of Transportation (Non-Medical): No  Physical Activity: Inactive   Days of Exercise per Week: 0 days   Minutes of Exercise per Session: 0 min  Stress: No Stress Concern Present   Feeling of Stress : Not at all  Social Connections: Socially Integrated   Frequency of Communication with Friends and Family: More than three times a week   Frequency of Social Gatherings with Friends and Family: More than three times a week   Attends Religious Services: More than 4 times per year   Active Member of [employer] or Organizations: Yes   Attends Music therapist: More than 4 times per year   Marital Status: Married    Tobacco Counseling Counseling given: Not Answered   Clinical Intake:  Pre-visit preparation completed: Yes  Pain : 0-10 Pain Score: 4  Pain Type: Chronic pain Pain Location: Back Pain Orientation: Lower,Mid,Upper Pain Radiating Towards: bilateral lower ext Pain Descriptors / Indicators: Constant,Discomfort,Throbbing Pain Onset: More than a month ago Pain Frequency: Constant Pain Relieving Factors: Pain  Patches Effect of Pain on Daily Activities: Pain can diminish job performance, lower motivation to exercise, and prevent you from completing daily tasks. Pain produces disability and affects the quality of life.  Pain Relieving Factors: Pain Patches  BMI - recorded: 31.53 Nutritional Status: BMI > 30  Obese Nutritional Risks: None Diabetes: No  How often do you need to have someone help you when you read instructions, pamphlets, or other written materials from your doctor or pharmacy?: 1 - Never What is the last grade level you completed in school?: 3 years of college  Diabetic? no  Interpreter Needed?: No  Information entered by :: Lisette Abu, LPN   Activities of Daily Living In your present state of health, do you have any difficulty performing the following activities: 03/27/2020 12/13/2019  Hearing? N N  Vision? N N  Difficulty concentrating or making decisions? N N  Walking or climbing stairs? Y Y  Dressing or bathing? N Y  Doing errands, shopping? N Y  Conservation officer, nature and eating ? N -  Using the Toilet? N -  In the past six months, have you accidently leaked urine? Y -  Do you have problems with loss of bowel control? N -  Managing your Medications? N -  Managing your Finances? N -  Housekeeping or managing your Housekeeping? N -  Some recent data might be hidden    Patient Care Team: Plotnikov, Evie Lacks, MD as PCP - General Magda Bernheim, MD as Referring Physician (Orthopedic Surgery) Irene Shipper, MD (Gastroenterology) Eustace Moore, MD as Consulting Physician (Neurosurgery) Clydell Hakim, MD as Consulting Physician (Pain Medicine) Truitt Merle, MD as Consulting Physician (Hematology) Alla Feeling, NP as Nurse Practitioner (Nurse Practitioner) Tat, Eustace Quail, DO as Consulting Physician (Neurology) Margaretha Sheffield, MD as Referring Physician (Physical Medicine and Rehabilitation) Marilynne Halsted, MD as Referring Physician (Ophthalmology)  Indicate any  recent Medical Services you may have received from other than [facility] providers in the past year (date may be approximate).     Assessment:   This is a routine wellness examination for Sabirin.  Hearing/Vision screen No exam data present  Dietary issues and exercise activities discussed: Current Exercise Habits: The patient does not participate in regular exercise at present, Exercise limited by: respiratory conditions(s);orthopedic condition(s);psychological condition(s);neurologic condition(s)  Goals   None    Depression Screen PHQ 2/9 Scores 03/27/2020 12/13/2019 05/14/2017 03/17/2016 05/12/2013  PHQ - 2 Score 0 0 1 1 0    Fall Risk Fall Risk  03/27/2020 04/20/2019 11/15/2018 05/13/2018 12/13/2017  Falls in the past year? 1 1 1 1  No  Number falls in past yr: 0 1 0 1 -  Injury with Fall? 0 0 1 0 -  Risk for fall due to : No Fall Risks - - History of fall(s);Impaired balance/gait -  Follow up - - - Falls evaluation completed -    FALL RISK PREVENTION PERTAINING TO THE HOME:  Any stairs in or around the home? No  If so, are there any without handrails? No  Home free of loose throw rugs in walkways, pet beds, electrical cords, etc? Yes  Adequate lighting in your home to reduce risk of falls? Yes   ASSISTIVE DEVICES UTILIZED TO PREVENT FALLS:  Life alert? No  Use of a cane, walker or w/c? Yes  Grab bars in the bathroom? Yes  Shower chair or bench in shower? Yes  Elevated toilet seat or a handicapped toilet? Yes   TIMED UP AND GO:  Was the test performed? No .  Length of time to ambulate 10 feet: 0 sec.   Gait steady and fast with assistive device  Cognitive Function:   Montreal Cognitive Assessment  05/13/2018  Visuospatial/ Executive (0/5) 3  Naming (0/3) 2  Attention: Read list of digits (0/2) 2  Attention: Read list of letters (0/1) 0  Attention: Serial 7 subtraction starting at 100 (0/3) 3  Language: Repeat phrase (0/2) 2  Language : Fluency (0/1) 0  Abstraction (0/2)  2  Delayed Recall (0/5) 1  Orientation (0/6) 6  Total 21      Immunizations Immunization History  Administered Date(s) Administered   Fluad Quad(high Dose 65+) 10/19/2018, 12/13/2019   Influenza Split 11/20/2010, 12/04/2011   Influenza Whole 12/02/2007, 01/03/2009   Influenza, High Dose Seasonal PF 12/03/2015, 02/12/2017, 12/20/2017   Influenza,inj,Quad PF,6+ Mos 10/21/2012, 02/09/2014, 02/05/2015   Meningococcal Polysaccharide 03/29/2006   PFIZER(Purple Top)SARS-COV-2 Vaccination 04/16/2019, 05/10/2019, 02/13/2020   Pneumococcal Conjugate-13 04/25/2013   Pneumococcal Polysaccharide-23 03/29/2006, 03/02/2011   Td 07/19/2012   Zoster 03/19/2011    TDAP status: Up to date  Flu Vaccine status: Up to date  Pneumococcal vaccine status: Up to date  Covid-19 vaccine status: Completed vaccines  Qualifies for Shingles Vaccine? Yes   Zostavax completed Yes   Shingrix Completed?: No.    Education has been provided regarding the importance of this vaccine. Patient has been advised to call insurance company to determine out of pocket expense if they have not yet received this vaccine. Advised may also receive vaccine at local pharmacy or Health Dept. Verbalized acceptance and understanding.  Screening Tests Health Maintenance  Topic Date Due   FOOT EXAM  Never done   OPHTHALMOLOGY EXAM  Never done   URINE MICROALBUMIN  Never done   MAMMOGRAM  08/04/2019   HEMOGLOBIN A1C  06/12/2020   COVID-19 Vaccine (4 - Booster for Napavine series) 08/13/2020   COLONOSCOPY (Pts 45-72yrs Insurance coverage will need to be confirmed)  03/31/2021   TETANUS/TDAP  07/20/2022   INFLUENZA VACCINE  Completed   DEXA SCAN  Completed   Hepatitis C Screening  Completed  PNA vac Low Risk Adult  Completed    Health Maintenance  Health Maintenance Due  Topic Date Due   FOOT EXAM  Never done   OPHTHALMOLOGY EXAM  Never done   URINE MICROALBUMIN  Never done   MAMMOGRAM  08/04/2019    Colorectal  cancer screening: Type of screening: Colonoscopy. Completed 03/31/2018. Repeat every 3 years  Mammogram status: Completed 08/03/2017. Repeat every year  Bone Density status: Completed 12/20/2017. Results reflect: Bone density results: OSTEOPOROSIS. Repeat every 2-3 years.  Lung Cancer Screening: (Low Dose CT Chest recommended if Age 1-80 years, 30 pack-year currently smoking OR have quit w/in 15years.) does not qualify.   Lung Cancer Screening Referral: no  Additional Screening:  Hepatitis C Screening: does qualify; Completed yes  Vision Screening: Recommended annual ophthalmology exams for early detection of glaucoma and other disorders of the eye. Is the patient up to date with their annual eye exam?  Yes  Who is the provider or what is the name of the office in which the patient attends annual eye exams? Marilynne Halsted, MD. If pt is not established with a provider, would they like to be referred to a provider to establish care? No .   Dental Screening: Recommended annual dental exams for proper oral hygiene  Community Resource Referral / Chronic Care Management: CRR required this visit?  No   CCM required this visit?  No      Plan:     I have personally reviewed and noted the following in the patient's chart:   Medical and social history Use of alcohol, tobacco or illicit drugs  Current medications and supplements Functional ability and status Nutritional status Physical activity Advanced directives List of other physicians Hospitalizations, surgeries, and ER visits in previous 12 months Vitals Screenings to include cognitive, depression, and falls Referrals and appointments  In addition, I have reviewed and discussed with patient certain preventive protocols, quality metrics, and best practice recommendations. A written personalized care plan for preventive services as well as general preventive health recommendations were provided to patient.     Sheral Flow, LPN   [id number]   Nurse Notes: n/a  Medical screening examination/treatment/procedure(s) were performed by non-physician practitioner and as supervising physician I was immediately available for consultation/collaboration.  I agree with above. Lew Dawes, MD

## 2020-03-27 NOTE — Patient Instructions (Signed)
Mallory Klein , Thank you for taking time to come for your Medicare Wellness Visit. I appreciate your ongoing commitment to your health goals. Please review the following plan we discussed and let me know if I can assist you in the future.   Screening recommendations/referrals: Colonoscopy: 03/31/2018; due every 2 years Mammogram: 08/03/2017 Bone Density: 12/20/2017; due every 2 years Recommended yearly ophthalmology/optometry visit for glaucoma screening and checkup Recommended yearly dental visit for hygiene and checkup  Vaccinations: Influenza vaccine: 12/13/2019 Pneumococcal vaccine: up to date Tdap vaccine: 07/19/2012 Shingles vaccine: never done   Covid-19: up to date  Advanced directives: Advance directive discussed with you today. Even though you declined this today please call our office should you change your mind and we can give you the proper paperwork for you to fill out.  Conditions/risks identified: Yes. Reviewed health maintenance screenings with patient today and relevant education, vaccines, and/or referrals were provided. Continue doing brain stimulating activities (puzzles, reading, adult coloring books, staying active) to keep memory sharp. Continue to eat heart healthy diet (full of fruits, vegetables, whole grains, lean protein, water--limit salt, fat, and sugar intake) and increase physical activity as tolerated.  Next appointment: Please schedule your next Medicare Wellness Visit with your Nurse Health Advisor in 1 year by calling [phone].   Preventive Care 40 Years and Older, Female Preventive care refers to lifestyle choices and visits with your health care provider that can promote health and wellness. What does preventive care include?  A yearly physical exam. This is also called an annual well check.  Dental exams once or twice a year.  Routine eye exams. Ask your health care provider how often you should have your eyes checked.  Personal lifestyle  choices, including:  Daily care of your teeth and gums.  Regular physical activity.  Eating a healthy diet.  Avoiding tobacco and drug use.  Limiting alcohol use.  Practicing safe sex.  Taking low-dose aspirin every day.  Taking vitamin and mineral supplements as recommended by your health care provider. What happens during an annual well check? The services and screenings done by your health care provider during your annual well check will depend on your age, overall health, lifestyle risk factors, and family history of disease. Counseling  Your health care provider may ask you questions about your:  Alcohol use.  Tobacco use.  Drug use.  Emotional well-being.  Home and relationship well-being.  Sexual activity.  Eating habits.  History of falls.  Memory and ability to understand (cognition).  Work and work Statistician.  Reproductive health. Screening  You may have the following tests or measurements:  Height, weight, and BMI.  Blood pressure.  Lipid and cholesterol levels. These may be checked every 5 years, or more frequently if you are over 39 years old.  Skin check.  Lung cancer screening. You may have this screening every year starting at age 77 if you have a 30-pack-year history of smoking and currently smoke or have quit within the past 15 years.  Fecal occult blood test (FOBT) of the stool. You may have this test every year starting at age 49.  Flexible sigmoidoscopy or colonoscopy. You may have a sigmoidoscopy every 5 years or a colonoscopy every 10 years starting at age 2.  Hepatitis C blood test.  Hepatitis B blood test.  Sexually transmitted disease (STD) testing.  Diabetes screening. This is done by checking your blood sugar (glucose) after you have not eaten for a while (fasting). You may have this  done every 1-3 years.  Bone density scan. This is done to screen for osteoporosis. You may have this done starting at age  48.  Mammogram. This may be done every 1-2 years. Talk to your health care provider about how often you should have regular mammograms. Talk with your health care provider about your test results, treatment options, and if necessary, the need for more tests. Vaccines  Your health care provider may recommend certain vaccines, such as:  Influenza vaccine. This is recommended every year.  Tetanus, diphtheria, and acellular pertussis (Tdap, Td) vaccine. You may need a Td booster every 10 years.  Zoster vaccine. You may need this after age 41.  Pneumococcal 13-valent conjugate (PCV13) vaccine. One dose is recommended after age 21.  Pneumococcal polysaccharide (PPSV23) vaccine. One dose is recommended after age 72. Talk to your health care provider about which screenings and vaccines you need and how often you need them. This information is not intended to replace advice given to you by your health care provider. Make sure you discuss any questions you have with your health care provider. Document Released: 03/08/2015 Document Revised: 10/30/2015 Document Reviewed: 12/11/2014 Elsevier Interactive Patient Education  2017 [place] Prevention in the Home Falls can cause injuries. They can happen to people of all ages. There are many things you can do to make your home safe and to help prevent falls. What can I do on the outside of my home?  Regularly fix the edges of walkways and driveways and fix any cracks.  Remove anything that might make you trip as you walk through a door, such as a raised step or threshold.  Trim any bushes or trees on the path to your home.  Use bright outdoor lighting.  Clear any walking paths of anything that might make someone trip, such as rocks or tools.  Regularly check to see if handrails are loose or broken. Make sure that both sides of any steps have handrails.  Any raised decks and porches should have guardrails on the edges.  Have any  leaves, snow, or ice cleared regularly.  Use sand or salt on walking paths during winter.  Clean up any spills in your garage right away. This includes oil or grease spills. What can I do in the bathroom?  Use night lights.  Install grab bars by the toilet and in the tub and shower. Do not use towel bars as grab bars.  Use non-skid mats or decals in the tub or shower.  If you need to sit down in the shower, use a plastic, non-slip stool.  Keep the floor dry. Clean up any water that spills on the floor as soon as it happens.  Remove soap buildup in the tub or shower regularly.  Attach bath mats securely with double-sided non-slip rug tape.  Do not have throw rugs and other things on the floor that can make you trip. What can I do in the bedroom?  Use night lights.  Make sure that you have a light by your bed that is easy to reach.  Do not use any sheets or blankets that are too big for your bed. They should not hang down onto the floor.  Have a firm chair that has side arms. You can use this for support while you get dressed.  Do not have throw rugs and other things on the floor that can make you trip. What can I do in the kitchen?  Clean up any spills  right away.  Avoid walking on wet floors.  Keep items that you use a lot in easy-to-reach places.  If you need to reach something above you, use a strong step stool that has a grab bar.  Keep electrical cords out of the way.  Do not use floor polish or wax that makes floors slippery. If you must use wax, use non-skid floor wax.  Do not have throw rugs and other things on the floor that can make you trip. What can I do with my stairs?  Do not leave any items on the stairs.  Make sure that there are handrails on both sides of the stairs and use them. Fix handrails that are broken or loose. Make sure that handrails are as long as the stairways.  Check any carpeting to make sure that it is firmly attached to the stairs.  Fix any carpet that is loose or worn.  Avoid having throw rugs at the top or bottom of the stairs. If you do have throw rugs, attach them to the floor with carpet tape.  Make sure that you have a light switch at the top of the stairs and the bottom of the stairs. If you do not have them, ask someone to add them for you. What else can I do to help prevent falls?  Wear shoes that:  Do not have high heels.  Have rubber bottoms.  Are comfortable and fit you well.  Are closed at the toe. Do not wear sandals.  If you use a stepladder:  Make sure that it is fully opened. Do not climb a closed stepladder.  Make sure that both sides of the stepladder are locked into place.  Ask someone to hold it for you, if possible.  Clearly mark and make sure that you can see:  Any grab bars or handrails.  First and last steps.  Where the edge of each step is.  Use tools that help you move around (mobility aids) if they are needed. These include:  Canes.  Walkers.  Scooters.  Crutches.  Turn on the lights when you go into a dark area. Replace any light bulbs as soon as they burn out.  Set up your furniture so you have a clear path. Avoid moving your furniture around.  If any of your floors are uneven, fix them.  If there are any pets around you, be aware of where they are.  Review your medicines with your doctor. Some medicines can make you feel dizzy. This can increase your chance of falling. Ask your doctor what other things that you can do to help prevent falls. This information is not intended to replace advice given to you by your health care provider. Make sure you discuss any questions you have with your health care provider. Document Released: 12/06/2008 Document Revised: 07/18/2015 Document Reviewed: 03/16/2014 Elsevier Interactive Patient Education  2017 [employer].

## 2020-03-27 NOTE — Assessment & Plan Note (Signed)
On B12 

## 2020-03-27 NOTE — Assessment & Plan Note (Addendum)
Cont w/Sinemet CR, donepezil She can try lions mane mushroom

## 2020-03-27 NOTE — Assessment & Plan Note (Addendum)
Zolpidem at bedtime as needed

## 2020-03-27 NOTE — Progress Notes (Signed)
Subjective:  Patient ID: Mallory Klein, female    DOB: [birth year]  Age: 75 y.o. MRN: [id number]  CC: Follow-up (3 MONTH F/U)   HPI Mallory Klein presents for HTN, LBP, asthma Dog just died-per 69 yo yorkie -Mallory Klein is grieving  Outpatient Medications Prior to Visit  Medication Sig Dispense Refill  . albuterol (VENTOLIN HFA) 108 (90 Base) MCG/ACT inhaler Inhale 2 puffs into the lungs every 6 (six) hours as needed for wheezing or shortness of breath. 18 g 11  . buprenorphine (BUTRANS) 7.5 MCG/HR Place 1 patch onto the skin once a week. 4 patch 0  . carbidopa-levodopa (SINEMET CR) 50-200 MG tablet Take 1 pill 3 times daily at 11 AM, 5 PM and 10 PM daily. 270 tablet 3  . celecoxib (CELEBREX) 200 MG capsule Take 1 capsule (200 mg total) by mouth 2 (two) times daily as needed for moderate pain. 60 capsule 0  . cetirizine (ZYRTEC) 10 MG tablet TAKE 1 TABLET BY MOUTH EVERY DAY 90 tablet 3  . Cholecalciferol (VITAMIN D3) 50 MCG (2000 UT) capsule Take 1 capsule (2,000 Units total) by mouth daily. 100 capsule 3  . cyanocobalamin (,VITAMIN B-12,) 1000 MCG/ML injection Inject 1 mL (1,000 mcg total) into the skin every 14 (fourteen) days. 10 mL 4  . diclofenac Sodium (VOLTAREN) 1 % GEL Apply 4 g topically daily.    [name] docusate sodium (COLACE) 100 MG capsule SMARTSIG:1 Tablet(s) By Mouth Twice Daily    . donepezil (ARICEPT) 5 MG tablet Take 1 tablet (5 mg total) by mouth at bedtime. 90 tablet 3  . fluocinonide ointment (LIDEX) [id number] % Apply 1 application topically 2 (two) times daily. 60 g 1  . hydrOXYzine (ATARAX/VISTARIL) 25 MG tablet Take 1 tablet (25 mg total) by mouth every 8 (eight) hours as needed for itching (itching from Butrans patch). 60 tablet 0  . IRON PO Take 1 tablet by mouth daily.    . montelukast (SINGULAIR) 10 MG tablet Take 1 tablet (10 mg total) by mouth daily. 90 tablet 3  . pantoprazole (PROTONIX) 40 MG tablet Take 1 tablet (40 mg total) by mouth daily. 30 tablet 11  .  triazolam (HALCION) 0.25 MG tablet Take 1-2 tablets (0.25-0.5 mg total) by mouth at bedtime as needed for sleep. 60 tablet 3  . venlafaxine XR (EFFEXOR-XR) 150 MG 24 hr capsule TAKE 1 CAPSULE BY MOUTH  EVERY DAY WITH BREAKFAST 90 capsule 3  . verapamil (VERELAN PM) 120 MG 24 hr capsule TAKE 1 CAPSULE (120 MG TOTAL) BY MOUTH AT BEDTIME. 90 capsule 3  . baclofen (LIORESAL) 10 MG tablet Take 5 mg by mouth 3 (three) times daily. (Patient not taking: No sig reported)    . buprenorphine (BUTRANS) 5 MCG/HR PTWK 1 patch to skin once a week     No facility-administered medications prior to visit.    ROS: Review of Systems  Constitutional: Positive for fatigue. Negative for activity change, appetite change, chills and unexpected weight change.  HENT: Negative for congestion, mouth sores and sinus pressure.   Eyes: Negative for visual disturbance.  Respiratory: Negative for cough and chest tightness.   Gastrointestinal: Negative for abdominal pain and nausea.  Genitourinary: Negative for difficulty urinating, frequency and vaginal pain.  Musculoskeletal: Positive for arthralgias, back pain and gait problem.  Skin: Negative for pallor and rash.  Neurological: Negative for dizziness, tremors, weakness, numbness and headaches.  Psychiatric/Behavioral: Negative for confusion, sleep disturbance and suicidal ideas. The patient is nervous/anxious.  Objective:  BP (!) 150/80 (BP Location: Left Arm)   Pulse 83   Temp 98.3 F (36.8 C) (Oral)   Ht 5' 1.5" (1.562 m)   Wt 169 lb 9.6 oz (76.9 kg)   SpO2 97%   BMI 31.53 kg/m   BP Readings from Last 3 Encounters:  03/27/20 (!) 150/80  03/27/20 (!) 150/80  01/10/20 120/70    Wt Readings from Last 3 Encounters:  03/27/20 169 lb 9.6 oz (76.9 kg)  03/27/20 169 lb 9.6 oz (76.9 kg)  01/10/20 172 lb (78 kg)    Physical Exam Constitutional:      General: She is not in acute distress.    Appearance: She is well-developed. She is obese.  HENT:      Head: Normocephalic.     Right Ear: External ear normal.     Left Ear: External ear normal.     Nose: Nose normal.     Mouth/Throat:     Mouth: Oropharynx is clear and moist.  Eyes:     General:        Right eye: No discharge.        Left eye: No discharge.     Conjunctiva/sclera: Conjunctivae normal.     Pupils: Pupils are equal, round, and reactive to light.  Neck:     Thyroid: No thyromegaly.     Vascular: No JVD.     Trachea: No tracheal deviation.  Cardiovascular:     Rate and Rhythm: Normal rate and regular rhythm.     Heart sounds: Normal heart sounds.  Pulmonary:     Effort: No respiratory distress.     Breath sounds: No stridor. No wheezing.  Abdominal:     General: Bowel sounds are normal. There is no distension.     Palpations: Abdomen is soft. There is no mass.     Tenderness: There is no abdominal tenderness. There is no guarding or rebound.  Musculoskeletal:        General: Tenderness present. No edema.     Cervical back: Normal range of motion and neck supple.  Lymphadenopathy:     Cervical: No cervical adenopathy.  Skin:    Findings: No erythema or rash.  Neurological:     Mental Status: She is oriented to person, place, and time.     Cranial Nerves: No cranial nerve deficit.     Motor: Weakness present. No abnormal muscle tone.     Coordination: Coordination abnormal.     Gait: Gait abnormal.     Deep Tendon Reflexes: Reflexes normal.  Psychiatric:        Mood and Affect: Mood and affect normal.        Behavior: Behavior normal.        Thought Content: Thought content normal.        Judgment: Judgment normal.   Mallory Klein is using a walker.  She is sad. Tremor  Lab Results  Component Value Date   WBC 8.5 09/11/2019   HGB 13.8 09/11/2019   HCT 42.7 09/11/2019   PLT 361 09/11/2019   GLUCOSE 93 09/11/2019   CHOL 219 (H) 02/05/2015   TRIG 69.0 02/05/2015   HDL 73.70 02/05/2015   LDLDIRECT 109.8 02/10/2010   LDLCALC 132 (H) 02/05/2015   ALT 3  06/12/2019   AST 14 06/12/2019   NA 145 09/11/2019   K 4.3 09/11/2019   CL 104 09/11/2019   CREATININE 0.91 09/11/2019   BUN 13 09/11/2019   CO2 35 (H) 09/11/2019  TSH 0.82 02/12/2017   INR 0.97 02/07/2014   HGBA1C 5.9 12/13/2019    CT Abdomen Pelvis W Contrast  Result Date: 02/28/2018 CLINICAL DATA:  Abdominal pain EXAM: CT ABDOMEN AND PELVIS WITH CONTRAST TECHNIQUE: Multidetector CT imaging of the abdomen and pelvis was performed using the standard protocol following bolus administration of intravenous contrast. CONTRAST:  127mL ISOVUE-300 IOPAMIDOL (ISOVUE-300) INJECTION 61% COMPARISON:  None. FINDINGS: LOWER CHEST: There is no basilar pleural or apical pericardial effusion. HEPATOBILIARY: The hepatic contours and density are normal. There is no intra- or extrahepatic biliary dilatation. The gallbladder is normal. PANCREAS: The pancreatic parenchymal contours are normal and there is no ductal dilatation. There is no peripancreatic fluid collection. SPLEEN: Normal. ADRENALS/URINARY TRACT: --Adrenal glands: Normal. --Right kidney/ureter: No hydronephrosis, nephroureterolithiasis, perinephric stranding or solid renal mass. --Left kidney/ureter: No hydronephrosis, nephroureterolithiasis, perinephric stranding or solid renal mass. --Urinary bladder: Normal for degree of distention STOMACH/BOWEL: --Stomach/Duodenum: There is no hiatal hernia or other gastric abnormality. The duodenal course and caliber are normal. --Small bowel: No dilatation or inflammation. --Colon: No focal abnormality. --Appendix: Normal. VASCULAR/LYMPHATIC: There is aortic atherosclerosis without hemodynamically significant stenosis. The portal vein, splenic vein, superior mesenteric vein and IVC are patent. No abdominal or pelvic lymphadenopathy. REPRODUCTIVE: Status post hysterectomy. No adnexal mass. MUSCULOSKELETAL. Multilevel degenerative disc disease and facet arthrosis. No bony spinal canal stenosis. OTHER: None. IMPRESSION:  No acute abdominal or pelvic abnormality. Electronically Signed   By: Ulyses Jarred M.D.   On: 02/28/2018 18:50    Assessment & Plan:     Follow-up: No follow-ups on file.  Walker Kehr, MD

## 2020-03-31 NOTE — Assessment & Plan Note (Signed)
Mallory Klein is on [employer]

## 2020-03-31 NOTE — Assessment & Plan Note (Signed)
Continue with vitamin D therapy 

## 2020-03-31 NOTE — Assessment & Plan Note (Signed)
Continue with Effexor Dog just died-per 75 yo yorkie -Rande is grieving

## 2020-04-01 ENCOUNTER — Telehealth: Payer: Self-pay | Admitting: Internal Medicine

## 2020-04-01 MED ORDER — ZOLPIDEM TARTRATE 5 MG PO TABS: 5.0000 mg | ORAL_TABLET | Freq: Every evening | ORAL | 1 refills | Status: DC | PRN

## 2020-04-01 NOTE — Telephone Encounter (Signed)
zolpidem (AMBIEN) 5 MG tablet  Patient needs this medication sent to [facility] because it was too expensive at [place], [place], [address] Phone:  [phone]  Fax:  [phone]

## 2020-04-01 NOTE — Telephone Encounter (Signed)
Okay.  Done.  Thanks 

## 2020-04-08 ENCOUNTER — Telehealth: Payer: Self-pay | Admitting: Neurology

## 2020-04-08 DIAGNOSIS — M47816 Spondylosis without myelopathy or radiculopathy, lumbar region: Secondary | ICD-10-CM | POA: Diagnosis not present

## 2020-04-08 DIAGNOSIS — M1712 Unilateral primary osteoarthritis, left knee: Secondary | ICD-10-CM | POA: Diagnosis not present

## 2020-04-08 DIAGNOSIS — G894 Chronic pain syndrome: Secondary | ICD-10-CM | POA: Diagnosis not present

## 2020-04-08 DIAGNOSIS — M48061 Spinal stenosis, lumbar region without neurogenic claudication: Secondary | ICD-10-CM | POA: Diagnosis not present

## 2020-04-08 NOTE — Telephone Encounter (Signed)
@  3:48 pt left a vm stating she has lost her sense of tast and smell and would like to know what Dr Rexene Alberts would suggest

## 2020-04-08 NOTE — Telephone Encounter (Signed)
I have reached out to the pt via my chart for further information on this.

## 2020-04-09 ENCOUNTER — Telehealth: Payer: Self-pay | Admitting: Internal Medicine

## 2020-04-09 ENCOUNTER — Telehealth: Payer: Self-pay | Admitting: Neurology

## 2020-04-09 DIAGNOSIS — R430 Anosmia: Secondary | ICD-10-CM

## 2020-04-09 NOTE — Telephone Encounter (Signed)
I called pt and advised of recommendation via mychart, see telephone note from 04/09/20.

## 2020-04-09 NOTE — Telephone Encounter (Signed)
Please call patient and advise her that unfortunately, loss of taste and smell secondary to Parkinson's disease is typically not something that comes back.  If she has had recent onset of loss of taste and smell, it is not impossible that it is due to some other problem, she can talk to her primary care physician about getting evaluated for more recent loss of taste and smell by ENT (an ear/nose and throat specialist), which will likely require a referral.

## 2020-04-09 NOTE — Telephone Encounter (Signed)
Patient called and said that she got in contact with Dr. Guadelupe Sabin office and she said that they told her that her lost of taste and smell may not be from her Parkinson's and was wondering if a referral to an ENT could be placed. Please advise.

## 2020-04-11 NOTE — Telephone Encounter (Signed)
Okay.  I will place the referral.  Thanks 

## 2020-04-11 NOTE — Telephone Encounter (Signed)
Notified pt w/MD response.../lmb 

## 2020-04-15 ENCOUNTER — Other Ambulatory Visit: Payer: Self-pay | Admitting: *Deleted

## 2020-04-15 MED ORDER — CETIRIZINE HCL 10 MG PO TABS: 10.0000 mg | ORAL_TABLET | Freq: Every day | ORAL | 3 refills | Status: DC

## 2020-04-16 MED ORDER — ZOLPIDEM TARTRATE 5 MG PO TABS: 5.0000 mg | ORAL_TABLET | Freq: Every evening | ORAL | 1 refills | Status: DC | PRN

## 2020-04-16 NOTE — Addendum Note (Signed)
Addended by: Cassandria Anger on: 04/16/2020 11:35 PM   Modules accepted: Orders

## 2020-04-16 NOTE — Telephone Encounter (Signed)
Patient states she spoke to [facility] and they never received the medication

## 2020-04-16 NOTE — Telephone Encounter (Signed)
Okay.  Done.  Thanks 

## 2020-04-16 NOTE — Addendum Note (Signed)
Addended by: Earnstine Regal on: 04/16/2020 04:14 PM   Modules accepted: Orders

## 2020-04-22 ENCOUNTER — Telehealth: Payer: Self-pay | Admitting: Internal Medicine

## 2020-04-22 MED ORDER — ZOLPIDEM TARTRATE 10 MG PO TABS: 5.0000 mg | ORAL_TABLET | Freq: Every evening | ORAL | 1 refills | Status: DC | PRN

## 2020-04-22 NOTE — Telephone Encounter (Signed)
Called [facility] spoke w/pharmacist Otila Kluver. Gave her directions for the zolpidem. Otila Kluver states pt insurance will not cover #180. ?the max is 90 pills. She states we can change the rx t Zolpidem 10 mg to take 1/2-1 tab qhs and insurance will cover. Updated med list../lmb

## 2020-04-22 NOTE — Telephone Encounter (Signed)
[facility] called and is needing clarification directions for zolpidem (AMBIEN) 5 MG tablet. They can be reached at [phone]. Please advise.

## 2020-04-30 ENCOUNTER — Other Ambulatory Visit: Payer: Self-pay | Admitting: Internal Medicine

## 2020-05-02 DIAGNOSIS — R430 Anosmia: Secondary | ICD-10-CM | POA: Insufficient documentation

## 2020-05-02 DIAGNOSIS — H6121 Impacted cerumen, right ear: Secondary | ICD-10-CM | POA: Diagnosis not present

## 2020-05-02 DIAGNOSIS — G20 Parkinson's disease: Secondary | ICD-10-CM | POA: Diagnosis not present

## 2020-05-06 ENCOUNTER — Other Ambulatory Visit: Payer: Self-pay | Admitting: Otolaryngology

## 2020-05-06 DIAGNOSIS — M1712 Unilateral primary osteoarthritis, left knee: Secondary | ICD-10-CM | POA: Diagnosis not present

## 2020-05-06 DIAGNOSIS — G894 Chronic pain syndrome: Secondary | ICD-10-CM | POA: Diagnosis not present

## 2020-05-06 DIAGNOSIS — M7552 Bursitis of left shoulder: Secondary | ICD-10-CM | POA: Diagnosis not present

## 2020-05-06 DIAGNOSIS — M48061 Spinal stenosis, lumbar region without neurogenic claudication: Secondary | ICD-10-CM | POA: Diagnosis not present

## 2020-05-06 DIAGNOSIS — G20 Parkinson's disease: Secondary | ICD-10-CM

## 2020-05-06 DIAGNOSIS — R430 Anosmia: Secondary | ICD-10-CM

## 2020-05-06 DIAGNOSIS — M47816 Spondylosis without myelopathy or radiculopathy, lumbar region: Secondary | ICD-10-CM | POA: Diagnosis not present

## 2020-05-20 ENCOUNTER — Telehealth: Payer: Self-pay | Admitting: Neurology

## 2020-05-20 NOTE — Telephone Encounter (Signed)
Pt called, I having falls, sometimes I do not remember falling. Would like a call from the nurse to discuss a sooner appt.

## 2020-05-20 NOTE — Telephone Encounter (Signed)
I called the pt. She was originally scheduled for 05/22/2020 for her 3 month f/u visit. Pt reports she received the automatic reminder call but accidentally c/a the appt. Pt sts she wanted to discuss decrease ambulation. She reports her family will witness her stumble and she does not remember loosing her balance. She states she has fallen some but no injuries.   Pt has been put back on the schedule for 05/22/20 at 1 pm since the slot was still available with Dr. Rexene Alberts.

## 2020-05-20 NOTE — Telephone Encounter (Signed)
Please call patient back and try to get more information. Arrange for follow-up appointment with one of our nurse practitioners for sooner than scheduled or with myself if available. Please also advise patient that if she has fallen and injured herself that she needs to get checked out immediately in the emergency room or call 911.

## 2020-05-22 ENCOUNTER — Encounter: Payer: Self-pay | Admitting: Neurology

## 2020-05-22 ENCOUNTER — Ambulatory Visit: Payer: Medicare Other | Admitting: Neurology

## 2020-05-22 VITALS — BP 150/89 | HR 88 | Ht 61.5 in | Wt 165.0 lb

## 2020-05-22 DIAGNOSIS — R269 Unspecified abnormalities of gait and mobility: Secondary | ICD-10-CM

## 2020-05-22 DIAGNOSIS — Z9181 History of falling: Secondary | ICD-10-CM | POA: Diagnosis not present

## 2020-05-22 DIAGNOSIS — G20 Parkinson's disease: Secondary | ICD-10-CM
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
## 2020-05-22 NOTE — Patient Instructions (Addendum)
Please be really proactive with your constipation medication regimen, titrating as needed to where you have a formed stool at least every other day. You can use over-the-counter probiotic yogurt or pills, add fiber in the form of Metamucil, and use a stool softener or if needed a laxative, even daily if necessary.   We will continue with your medication at the current dose.  Your prescription is up-to-date.  Please use your walker at all times and try to stay better hydrated.  I would recommend that you do not take your Sinemet with a protein milkshakes such as Glucerna.  Please try to use saltine crackers or graham crackers instead and some diet ginger ale if need be.

## 2020-05-27 ENCOUNTER — Ambulatory Visit
Admission: RE | Admit: 2020-05-27 | Discharge: 2020-05-27 | Disposition: A | Discharge status: Home / Self Care | Payer: Medicare Other | Source: Ambulatory Visit | Attending: Otolaryngology | Admitting: Otolaryngology

## 2020-05-27 ENCOUNTER — Other Ambulatory Visit: Payer: Self-pay

## 2020-05-27 DIAGNOSIS — G319 Degenerative disease of nervous system, unspecified: Secondary | ICD-10-CM | POA: Diagnosis not present

## 2020-05-27 DIAGNOSIS — G20 Parkinson's disease: Secondary | ICD-10-CM

## 2020-05-27 DIAGNOSIS — G20A1 Parkinson's disease without dyskinesia, without mention of fluctuations: Secondary | ICD-10-CM

## 2020-05-27 DIAGNOSIS — R430 Anosmia: Secondary | ICD-10-CM

## 2020-05-27 IMAGING — MR MR HEAD WO/W CM
12 series · 48 of 48 positions shown · IV contrast (17ml multihance)
Comparison: CT paranasal sinuses [DATE].

CLINICAL DATA: 74-year-old female with Parkinson's. Recent fall.
Anosmia.

EXAM:
MRI HEAD WITHOUT AND WITH CONTRAST
TECHNIQUE: Multiplanar, multiecho pulse sequences of the brain and surrounding
structures were obtained without and with intravenous contrast.
CONTRAST:  17mL MULTIHANCE GADOBENATE DIMEGLUMINE 529 MG/ML IV SOLN

[Series 2: T1 · sagittal · 5.0mm · 0.45mm/px · 2 of 21 slices shown]
[im 1/21]
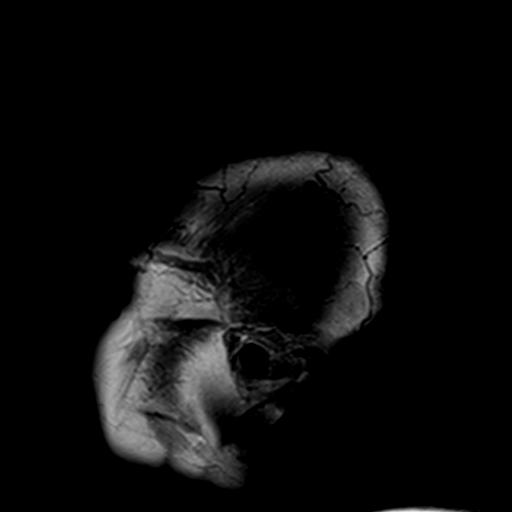
[im 21/21]
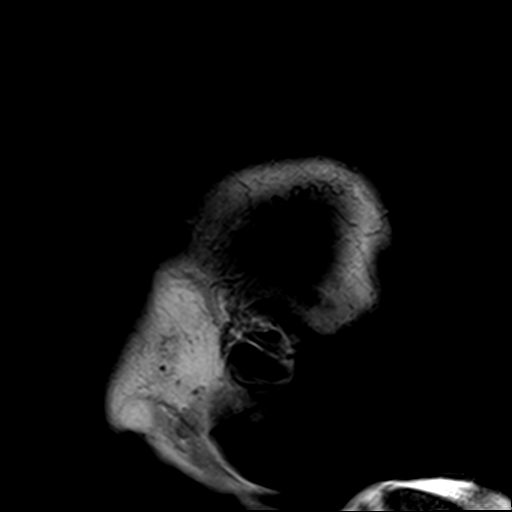

[Series 3: DWI · axial · 3.0mm · 1.80mm/px · z∈[-76,+68]mm · 7 of 98 slices shown (1 of 4)]
[im 1/98]
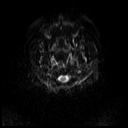
[im 17/98]
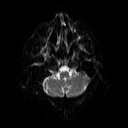
[im 33/98]
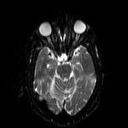
[im 49/98]
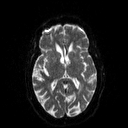
[im 65/98]
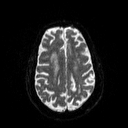
[im 81/98]
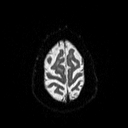
[im 98/98]
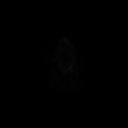

[Series 4: DWI · axial · 3.0mm · 1.80mm/px · z∈[-76,+68]mm · 3 of 50 slices shown (2 of 4)]
[im 1/50]
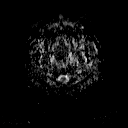
[im 25/50]
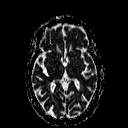
[im 50/50]
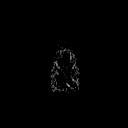

[Series 5: DWI · coronal · 5.0mm · 1.80mm/px · 5 of 72 slices shown (3 of 4)]
[im 1/72]
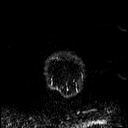
[im 18/72]
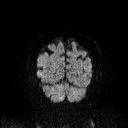
[im 36/72]
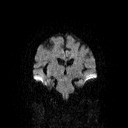
[im 54/72]
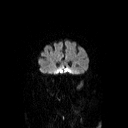
[im 72/72]
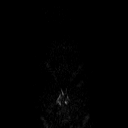

[Series 6: DWI · coronal · 5.0mm · 1.80mm/px · 2 of 36 slices shown (4 of 4)]
[im 1/36]
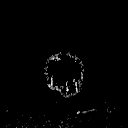
[im 36/36]
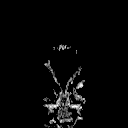

[Series 7: T2 · axial · 5.0mm · 0.60mm/px · 1 of 22 slices shown (1 of 2)]
[im 1/22]
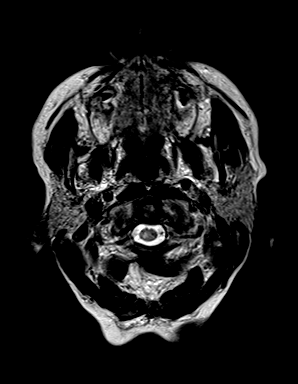

[Series 8: FLAIR · axial · 3.0mm · 0.45mm/px · z∈[-68,+65]mm · 2 of 30 slices shown]
[im 1/30]
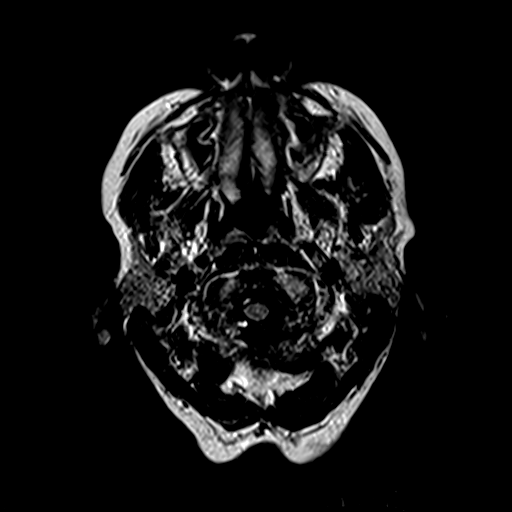
[im 30/30]
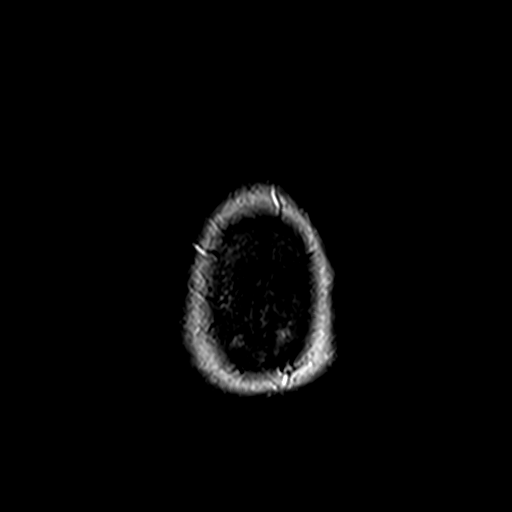

[Series 10: swi_images · axial · 4.0mm · 0.90mm/px · z∈[-70,+68]mm · 2 of 36 slices shown]
[im 1/36]
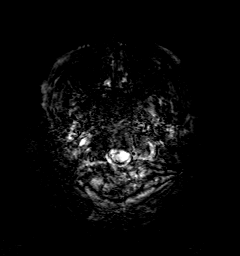
[im 36/36]
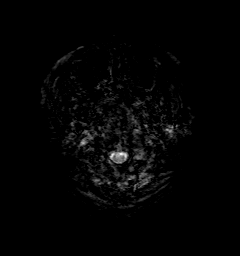

[Series 11: t1_mpr_tra · axial · 1.0mm · 0.75mm/px · z∈[-70,+71]mm · 10 of 144 slices shown]
[im 1/144]
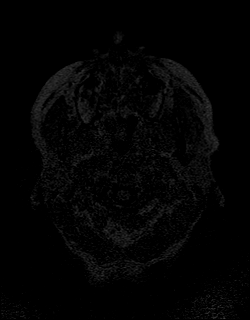
[im 16/144]
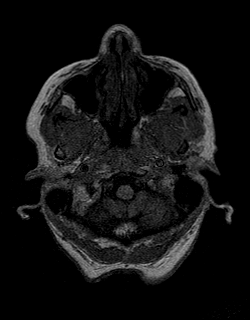
[im 32/144]
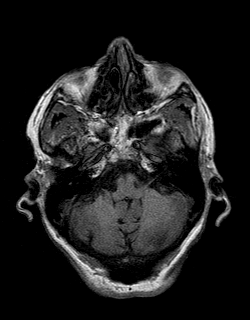
[im 48/144]
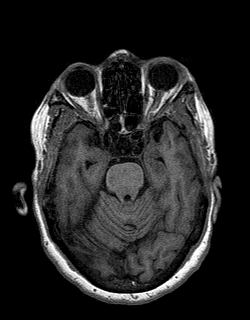
[im 64/144]
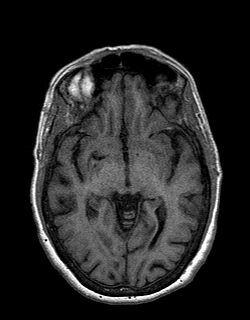
[im 80/144]
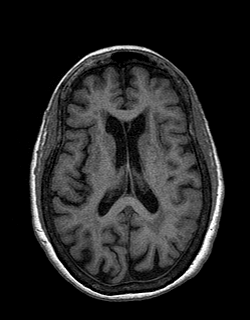
[im 96/144]
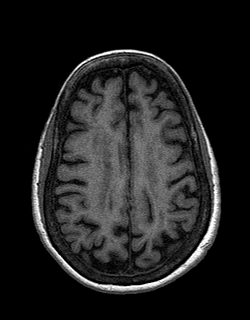
[im 112/144]
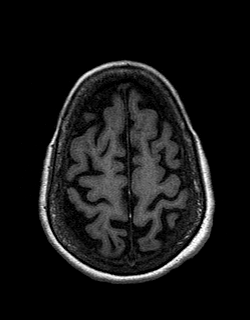
[im 128/144]
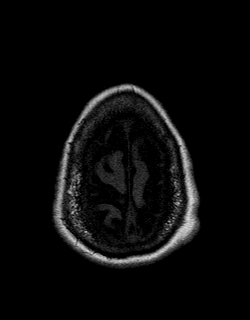
[im 144/144]
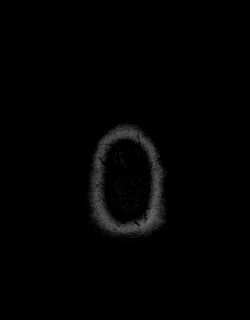

[Series 12: T2 · coronal · 5.0mm · 0.45mm/px · 2 of 28 slices shown (2 of 2)]
[im 1/28]
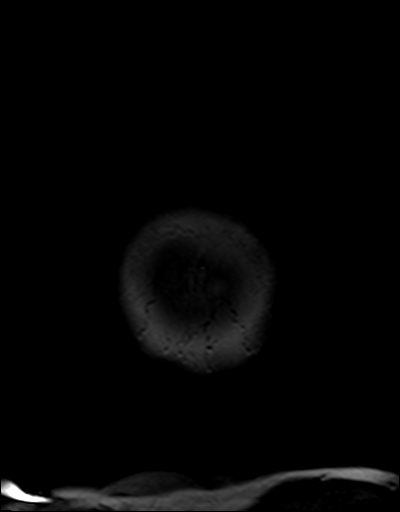
[im 28/28]
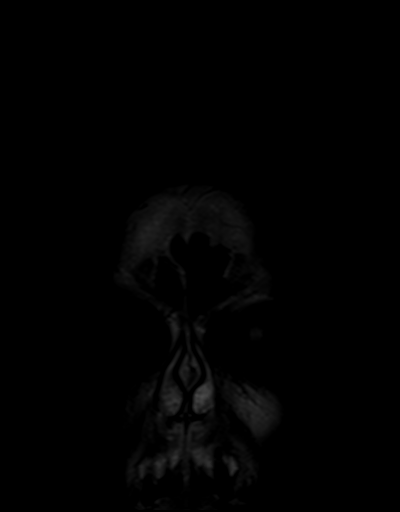

[Series 13: t1_mpr_tra post · axial · 1.0mm · 0.75mm/px · z∈[-70,+71]mm · 10 of 144 slices shown]
[im 1/144]
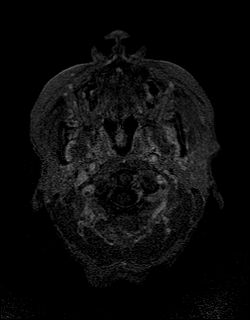
[im 16/144]
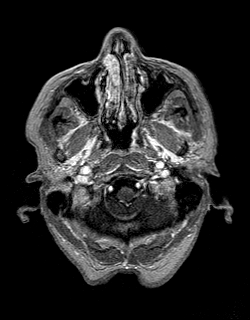
[im 32/144]
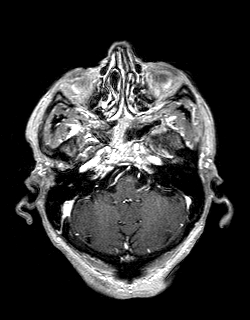
[im 48/144]
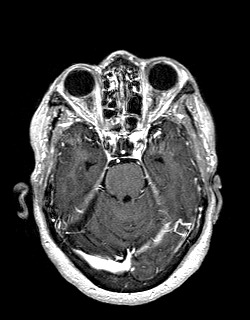
[im 64/144]
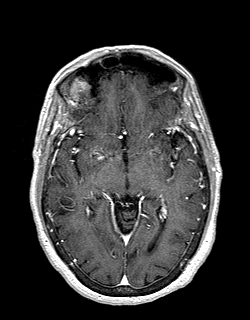
[im 80/144]
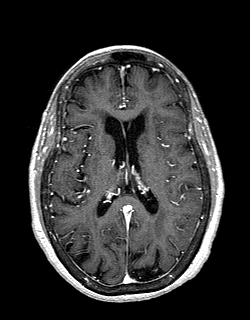
[im 96/144]
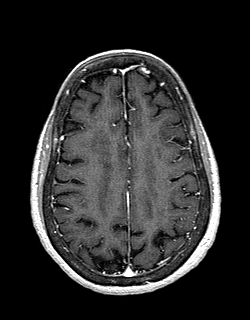
[im 112/144]
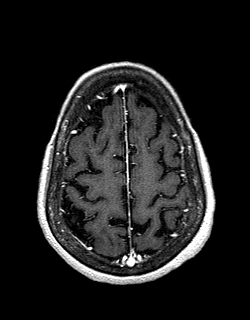
[im 128/144]
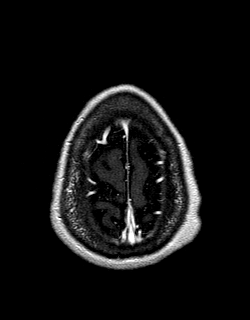
[im 144/144]
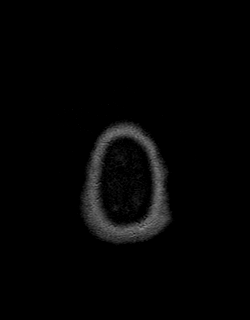

[Series 14: post cor · coronal · 5.0mm · 0.45mm/px · 2 of 28 slices shown]
[im 1/28]
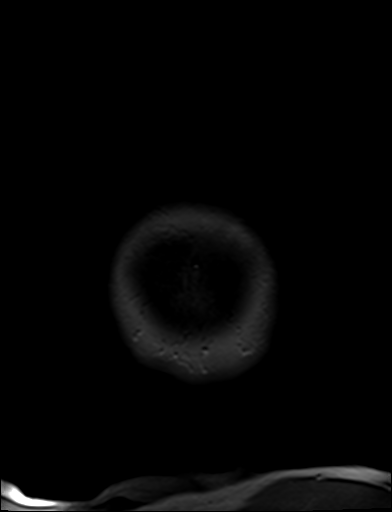
[im 28/28]
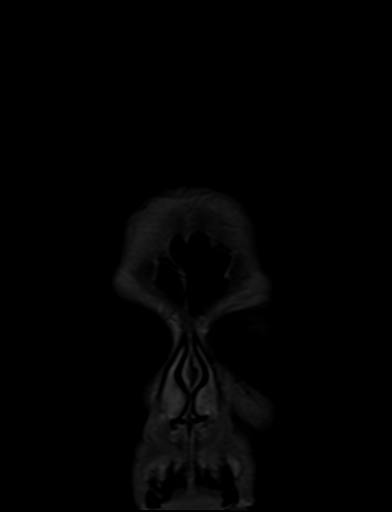

[48 of 48 positions shown; findings below may reference images not displayed]

FINDINGS: Brain: No restricted diffusion to suggest acute infarction. No
midline shift, mass effect, evidence of mass lesion,
ventriculomegaly, extra-axial collection or acute intracranial
hemorrhage. Cervicomedullary junction and pituitary are within
normal limits.

Patchy and confluent bilateral cerebral white matter T2 and FLAIR
hyperintensity. Fairly extensive involvement of the bilateral
frontal and parietal lobes in a nonspecific configuration. No
superimposed cortical encephalomalacia. No chronic cerebral blood
products. Deep gray matter nuclei, brainstem and cerebellum are
within normal limits for age. Overall cerebral volume seems normal
for age.

No abnormal enhancement identified.  No dural thickening.

Vascular: Major intracranial vascular flow voids are preserved.
Generalized intracranial artery tortuosity. The major dural venous
sinuses are enhancing and appear to be patent.

Skull and upper cervical spine: Mild for age visible cervical spine
degeneration with disc disease at C3-C4 apparently causing mild
spinal stenosis on series 2, image 11. Visualized bone marrow signal
is within normal limits.

Sinuses/Orbits: Postoperative changes to both globes. Otherwise
negative orbits.

Mild bilateral scattered paranasal sinus mucosal thickening. Small
mucous retention cysts in both maxillary sinuses. Nasal cavity
appears fairly negative. Both olfactory bulbs are difficult to
identify on these routine images and could be atrophied (series 12,
image 23).

Other: Mastoids are clear. Visible internal auditory structures
appear normal. Visible scalp and face soft tissues appear negative.
IMPRESSION: 1. No acute intracranial abnormality.
2. Moderately advanced but nonspecific cerebral white matter signal
changes, most commonly due to chronic small vessel disease.
3. Olfactory bulbs are not identified suspicious for atrophy.
4. Mild paranasal sinus inflammation.
5. C3-C4 disc degeneration with mild cervical spinal stenosis.

## 2020-05-27 MED ORDER — GADOBENATE DIMEGLUMINE 529 MG/ML IV SOLN
16.0000 mL | Freq: Once | INTRAVENOUS | Status: AC | PRN
  Administered 2020-05-27: 17 mL via INTRAVENOUS

## 2020-05-29 ENCOUNTER — Encounter: Payer: Self-pay | Admitting: Podiatry

## 2020-05-29 ENCOUNTER — Other Ambulatory Visit: Payer: Self-pay

## 2020-05-29 ENCOUNTER — Ambulatory Visit: Payer: Medicare Other | Admitting: Podiatry

## 2020-05-29 DIAGNOSIS — M79674 Pain in right toe(s): Secondary | ICD-10-CM

## 2020-05-29 DIAGNOSIS — M2141 Flat foot [pes planus] (acquired), right foot: Secondary | ICD-10-CM | POA: Diagnosis not present

## 2020-05-29 DIAGNOSIS — B351 Tinea unguium: Secondary | ICD-10-CM

## 2020-05-29 DIAGNOSIS — M2041 Other hammer toe(s) (acquired), right foot: Secondary | ICD-10-CM

## 2020-05-29 DIAGNOSIS — M2142 Flat foot [pes planus] (acquired), left foot: Secondary | ICD-10-CM

## 2020-05-29 DIAGNOSIS — M2012 Hallux valgus (acquired), left foot: Secondary | ICD-10-CM

## 2020-05-29 DIAGNOSIS — M2042 Other hammer toe(s) (acquired), left foot: Secondary | ICD-10-CM | POA: Diagnosis not present

## 2020-05-29 DIAGNOSIS — M2011 Hallux valgus (acquired), right foot: Secondary | ICD-10-CM | POA: Diagnosis not present

## 2020-05-29 DIAGNOSIS — M79675 Pain in left toe(s): Secondary | ICD-10-CM

## 2020-05-29 NOTE — Patient Instructions (Signed)

## 2020-06-03 DIAGNOSIS — M47816 Spondylosis without myelopathy or radiculopathy, lumbar region: Secondary | ICD-10-CM | POA: Diagnosis not present

## 2020-06-03 DIAGNOSIS — M1712 Unilateral primary osteoarthritis, left knee: Secondary | ICD-10-CM | POA: Diagnosis not present

## 2020-06-03 DIAGNOSIS — G894 Chronic pain syndrome: Secondary | ICD-10-CM | POA: Diagnosis not present

## 2020-06-03 DIAGNOSIS — M48061 Spinal stenosis, lumbar region without neurogenic claudication: Secondary | ICD-10-CM | POA: Diagnosis not present

## 2020-06-03 NOTE — Progress Notes (Signed)
Subjective: Mallory Klein presents today referred by Plotnikov, Evie Lacks, MD for complaint of painful thick toenails that are difficult to trim. Duration is for several weeks. Pain interferes with ambulation. Aggravating factors include wearing enclosed shoe gear. Patient states she is unable to cut her toenails due to OA of back. She also has h/o foot surgery b/l.  Past Medical History:  Diagnosis Date  . Abnormal CBC 02/05/2015   [name] 12/16 new: normocytic anemia and thrombocytosis  . Adjustment disorder with mixed anxiety and depressed mood 02/15/2007   Chronic  Chronic pain Grief, stress Effexor XR  Dad died in 2023/03/08  . Asthma   . Diverticulosis of colon (without mention of hemorrhage)   . Esophageal stricture   . Essential hypertension 01/26/2007   Chronic Verapamil   . GERD (gastroesophageal reflux disease) 02/15/2007   Chronic    . Heart murmur   . Hyperglycemia 03/02/2011   Mild    . Insomnia disorder 12/08/2018   10/20 Carbid/Lev dose was increased - c/o hard time falling asleep (6 am) getting up at 12 am, poor sleep. Try Temazepam 15-30 mg at 11-1:30 pm   . Internal hemorrhoids without mention of complication   . Iron deficiency anemia 04/23/2017  . Mild neurocognitive disorder, likely due to Parkinson's disease 02/09/2019  . OSA (obstructive sleep apnea) 02/14/2010   In lab study (May 2018): AHI 25. Only had 45 minutes of sleep secondary to frequent awakenings secondary to sleep apnea. autocpap 5-15 cm water.   . Osteoarthritis   . Parkinson disease ([place]) 03/07/14   2015 2017  Primidone - d/c 2019 Parkinson's: Sinmet IR. Dr Tat  . Postherpetic trigeminal neuralgia 01/26/2007   Qualifier: Diagnosis of  By: Marca Ancona RMA, [name]    . Shortness of breath dyspnea    with allergies  . Thrombocytosis 05/24/2017  . Urinary incontinence 01/26/2007   Chronic  10/17 Detrol LA   . Vitamin D deficiency 01/26/2019     Patient Active Problem List   Diagnosis Date Noted  . Anosmia  05/02/2020  . Grief reaction 03/27/2020  . Memory loss 12/13/2019  . Food poisoning 09/11/2019  . B12 deficiency 06/14/2019  . Mild neurocognitive disorder, likely due to Parkinson's disease 02/09/2019  . Vitamin D deficiency 01/26/2019  . Sweating profusely 12/08/2018  . Insomnia disorder 12/08/2018  . Cerumen impaction 10/19/2018  . Lumbar facet arthropathy 02/10/2018  . Thrombocytosis 05/24/2017  . Iron deficiency anemia 04/23/2017  . Cough 05/12/2016  . Pseudophakia of both eyes 10/09/2015  . Abnormal CBC 02/05/2015  . Edema 10/15/2014  . S/P lumbar laminectomy 02/20/2014  . Pre-ulcerative calluses 03-07-2014  . Parkinson disease ([place]) 03/07/14  . Cold sore 10/17/2013  . Low back pain 07/25/2013  . Nuclear sclerosis 02/08/2013  . Heel ulcer ([place]) 03/08/2012  . Rash 12/04/2011  . Neck pain on left side 08/06/2011  . Presence of unspecified artificial knee joint 04/22/2011  . Hyperglycemia 03/02/2011  . Acquired spondylolisthesis 12/25/2010  . Degeneration of lumbosacral intervertebral disc 12/25/2010  . Arthritis of right knee 12/03/2010  . Knee pain, right 09/08/2010  . OSA (obstructive sleep apnea) 02/14/2010  . BRONCHITIS, ACUTE 04/05/2009  . Vertigo 12/02/2007  . Other abnormal glucose 12/02/2007  . Otitis media 10/26/2007  . Other malaise and fatigue 05/11/2007  . Adjustment disorder with mixed anxiety and depressed mood 02/15/2007  . GERD (gastroesophageal reflux disease) 02/15/2007  . Osteoarthritis 02/15/2007  . Postherpetic trigeminal neuralgia 01/26/2007  . Herpes zoster 01/26/2007  .  Morbid obesity 01/26/2007  . Migraine headaches 01/26/2007  . Essential hypertension 01/26/2007  . Asthma 01/26/2007  . Urinary incontinence 01/26/2007     Past Surgical History:  Procedure Laterality Date  . ABDOMINAL HYSTERECTOMY    . BREAST CYST EXCISION Left 1980's  . LUMBAR LAMINECTOMY/DECOMPRESSION MICRODISCECTOMY Bilateral 02/20/2014   Procedure: LUMBAR TWO  THREE, LUMBAR THREE FOUR LUMBAR LAMINECTOMY/DECOMPRESSION MICRODISCECTOMY 2 LEVELS;  Surgeon: Eustace Moore, MD;  Location: [facility] [facility];  Service: Neurosurgery;  Laterality: Bilateral;  . SPLENECTOMY    . TIBIA FRACTURE SURGERY Right   . TIBIA FRACTURE SURGERY Left   . TOTAL KNEE ARTHROPLASTY Right 2013     Current Outpatient Medications on File Prior to Visit  Medication Sig Dispense Refill  . albuterol (VENTOLIN HFA) 108 (90 Base) MCG/ACT inhaler Inhale 2 puffs into the lungs every 6 (six) hours as needed for wheezing or shortness of breath. 18 g 11  . buprenorphine (BUTRANS) 7.5 MCG/HR Place 1 patch onto the skin once a week. 4 patch 0  . carbidopa-levodopa (SINEMET CR) 50-200 MG tablet Take 1 pill 3 times daily at 11 AM, 5 PM and 10 PM daily. 270 tablet 3  . celecoxib (CELEBREX) 200 MG capsule Take 1 capsule (200 mg total) by mouth 2 (two) times daily as needed for moderate pain. 60 capsule 3  . cetirizine (ZYRTEC) 10 MG tablet Take 1 tablet (10 mg total) by mouth daily. 90 tablet 3  . [facility] D3 50 MCG (2000 UT) CAPS TAKE 1 CAPSULE BY MOUTH EVERY DAY 100 capsule 3  . cyanocobalamin (,VITAMIN B-12,) 1000 MCG/ML injection Inject 1 mL (1,000 mcg total) into the skin every 14 (fourteen) days. 10 mL 4  . diclofenac Sodium (VOLTAREN) 1 % GEL Apply 4 g topically daily.    [name] docusate sodium (COLACE) 100 MG capsule SMARTSIG:1 Tablet(s) By Mouth Twice Daily    . donepezil (ARICEPT) 5 MG tablet Take 1 tablet (5 mg total) by mouth at bedtime. 90 tablet 3  . fluocinonide ointment (LIDEX) 1.70 % Apply 1 application topically 2 (two) times daily. 60 g 1  . hydrOXYzine (ATARAX/VISTARIL) 25 MG tablet Take 1 tablet (25 mg total) by mouth every 8 (eight) hours as needed for itching (itching from Butrans patch). (Patient taking differently: Take 25 mg by mouth 2 (two) times daily as needed for itching (itching from Butrans patch).) 60 tablet 0  . hydrOXYzine (VISTARIL) 25 MG capsule Take 25 mg by mouth 2 (two)  times daily.    . montelukast (SINGULAIR) 10 MG tablet Take 1 tablet (10 mg total) by mouth daily. 90 tablet 3  . OVER THE COUNTER MEDICATION 1 tablet daily. Lions mane extract    . pantoprazole (PROTONIX) 40 MG tablet Take 1 tablet (40 mg total) by mouth daily. 30 tablet 11  . polyethylene glycol (MIRALAX / GLYCOLAX) 17 g packet Take 17 g by mouth daily as needed.    . venlafaxine XR (EFFEXOR-XR) 150 MG 24 hr capsule TAKE 1 CAPSULE BY MOUTH  EVERY DAY WITH BREAKFAST 90 capsule 3  . verapamil (VERELAN PM) 120 MG 24 hr capsule Take 1 capsule (120 mg total) by mouth at bedtime. 30 capsule 11  . zolpidem (AMBIEN) 10 MG tablet Take 0.5-1 tablets (5-10 mg total) by mouth at bedtime as needed for sleep. 90 tablet 1   No current facility-administered medications on file prior to visit.    Allergies  Allergen Reactions  . Ace Inhibitors     Patient doesn't  recall reaction.  jkl  . Benicar [Olmesartan]     It made her sick  . Aspirin Other (See Comments)    bruising  . Citalopram Hydrobromide Diarrhea     Social History   Occupational History  . Occupation: Retired    Comment: Network engineer, admin assist   Tobacco Use  . Smoking status: Never Smoker  . Smokeless tobacco: Never Used  Vaping Use  . Vaping Use: Never used  Substance and Sexual Activity  . Alcohol use: No    Alcohol/week: 2.0 standard drinks    Types: 2 Glasses of wine per week    Comment: wine on occ   . Drug use: No  . Sexual activity: Not Currently     Family History  Problem Relation Age of Onset  . Hypertension Mother   . Diabetes type II Mother   . Asthma Mother   . Diabetes Mother   . Dementia Mother   . Prostate cancer Father   . Mental illness Father        dementia  . Suicidality Son   . Stroke Brother        died in prison  . Colon cancer Neg Hx   . Rectal cancer Neg Hx      Immunization History  Administered Date(s) Administered  . Fluad Quad(high Dose 65+) 10/19/2018, 12/13/2019  . Influenza  Split 11/20/2010, 12/04/2011  . Influenza Whole 12/02/2007, 01/03/2009  . Influenza, High Dose Seasonal PF 12/03/2015, 02/12/2017, 12/20/2017  . Influenza,inj,Quad PF,6+ Mos 10/21/2012, 02/09/2014, 02/05/2015  . Meningococcal Polysaccharide 03/29/2006  . PFIZER(Purple Top)SARS-COV-2 Vaccination 04/16/2019, 05/10/2019, 02/13/2020  . Pneumococcal Conjugate-13 04/25/2013  . Pneumococcal Polysaccharide-23 03/29/2006, 03/02/2011  . Td 07/19/2012  . Zoster 03/19/2011     Objective: SIEARRA AMBERG is a pleasant 75 y.o. female in NAD. AAO x 3.  There were no vitals filed for this visit.  Vascular Examination:  Capillary refill time to digits immediate b/l. Palpable pedal pulses b/l LE. Pedal hair sparse. Lower extremity skin temperature gradient within normal limits. No pain with calf compression b/l.  Dermatological Examination: Pedal skin with normal turgor, texture and tone bilaterally. No open wounds bilaterally. No interdigital macerations bilaterally. Toenails 1-5 b/l elongated, discolored, dystrophic, thickened, crumbly with subungual debris and tenderness to dorsal palpation.  Musculoskeletal: Normal muscle strength 5/5 to all lower extremity muscle groups bilaterally. No pain crepitus or joint limitation noted with ROM b/l. Hallux valgus with bunion deformity noted b/l lower extremities. Hammertoes noted to the b/l lower extremities. Pes planus deformity noted b/l.   Neurological: Protective sensation intact 5/5 intact bilaterally with 10g monofilament b/l. Vibratory sensation intact b/l. Involuntary tremors noted b/l feet.  Assessment: 1. Pain due to onychomycosis of toenails of both feet   2. Hallux valgus, acquired, bilateral   3. Acquired hammertoes of both feet   4. Pes planus of both feet     Plan: -Examined patient. -Patient to continue soft, supportive shoe gear daily. -Toenails 1-5 b/l were debrided in length and girth with sterile nail nippers and dremel without  iatrogenic bleeding.  -Patient to report any pedal injuries to medical professional immediately. -Patient/POA to call should there be question/concern in the interim.  Return in about 3 months (around 08/28/2020).  Marzetta Board, DPM

## 2020-06-25 ENCOUNTER — Ambulatory Visit (INDEPENDENT_AMBULATORY_CARE_PROVIDER_SITE_OTHER): Payer: Medicare Other | Admitting: Internal Medicine

## 2020-06-25 ENCOUNTER — Encounter: Payer: Self-pay | Admitting: Internal Medicine

## 2020-06-25 ENCOUNTER — Other Ambulatory Visit: Payer: Self-pay

## 2020-06-25 ENCOUNTER — Telehealth: Payer: Self-pay | Admitting: Hematology

## 2020-06-25 DIAGNOSIS — D500 Iron deficiency anemia secondary to blood loss (chronic): Secondary | ICD-10-CM | POA: Diagnosis not present

## 2020-06-25 DIAGNOSIS — R739 Hyperglycemia, unspecified: Secondary | ICD-10-CM | POA: Diagnosis not present

## 2020-06-25 DIAGNOSIS — J4540 Moderate persistent asthma, uncomplicated: Secondary | ICD-10-CM

## 2020-06-25 DIAGNOSIS — E538 Deficiency of other specified B group vitamins: Secondary | ICD-10-CM

## 2020-06-25 DIAGNOSIS — F19982 Other psychoactive substance use, unspecified with psychoactive substance-induced sleep disorder: Secondary | ICD-10-CM

## 2020-06-25 DIAGNOSIS — E559 Vitamin D deficiency, unspecified: Secondary | ICD-10-CM | POA: Diagnosis not present

## 2020-06-25 LAB — CBC WITH DIFFERENTIAL/PLATELET
Basophils Absolute: 0.1 10*3/uL (ref 0.0–0.1)
Basophils Relative: 0.7 % (ref 0.0–3.0)
Eosinophils Absolute: 0.2 10*3/uL (ref 0.0–0.7)
Eosinophils Relative: 2.6 % (ref 0.0–5.0)
HCT: 41.2 % (ref 36.0–46.0)
Hemoglobin: 13.3 g/dL (ref 12.0–15.0)
Lymphocytes Relative: 35.3 % (ref 12.0–46.0)
Lymphs Abs: 2.7 10*3/uL (ref 0.7–4.0)
MCHC: 32.3 g/dL (ref 30.0–36.0)
MCV: 89.7 fl (ref 78.0–100.0)
Monocytes Absolute: 0.6 10*3/uL (ref 0.1–1.0)
Monocytes Relative: 8.3 % (ref 3.0–12.0)
Neutro Abs: 4.1 10*3/uL (ref 1.4–7.7)
Neutrophils Relative %: 53.1 % (ref 43.0–77.0)
Platelets: 368 10*3/uL (ref 150.0–400.0)
RBC: 4.6 Mil/uL (ref 3.87–5.11)
RDW: 15.4 % (ref 11.5–15.5)
WBC: 7.7 10*3/uL (ref 4.0–10.5)

## 2020-06-25 LAB — COMPREHENSIVE METABOLIC PANEL
ALT: 2 U/L (ref 0–35)
AST: 11 U/L (ref 0–37)
Albumin: 3.9 g/dL (ref 3.5–5.2)
Alkaline Phosphatase: 60 U/L (ref 39–117)
BUN: 15 mg/dL (ref 6–23)
CO2: 34 mEq/L — ABNORMAL HIGH (ref 19–32)
Calcium: 9 mg/dL (ref 8.4–10.5)
Chloride: 100 mEq/L (ref 96–112)
Creatinine, Ser: 0.85 mg/dL (ref 0.40–1.20)
GFR: 67.44 mL/min (ref >60.00)
Glucose, Bld: 99 mg/dL (ref 70–99)
Potassium: 4.2 mEq/L (ref 3.5–5.1)
Sodium: 138 mEq/L (ref 135–145)
Total Bilirubin: 0.6 mg/dL (ref 0.2–1.2)
Total Protein: 7.2 g/dL (ref 6.0–8.3)

## 2020-06-25 LAB — HEMOGLOBIN A1C: Hgb A1c MFr Bld: 5.5 % (ref 4.6–6.5)

## 2020-06-25 LAB — TSH: TSH: 0.67 u[IU]/mL (ref 0.35–4.50)

## 2020-06-25 NOTE — Progress Notes (Signed)
Subjective:  Patient ID: Mallory Klein, female    DOB: [birth year]  Age: 75 y.o. MRN: [id number]  CC: Follow-up (3 month f/u)   HPI Mallory Klein presents for LBP, Parkinson's, Vit D def f/u  Outpatient Medications Prior to Visit  Medication Sig Dispense Refill  . albuterol (VENTOLIN HFA) 108 (90 Base) MCG/ACT inhaler Inhale 2 puffs into the lungs every 6 (six) hours as needed for wheezing or shortness of breath. 18 g 11  . buprenorphine (BUTRANS) 7.5 MCG/HR Place 1 patch onto the skin once a week. 4 patch 0  . carbidopa-levodopa (SINEMET CR) 50-200 MG tablet Take 1 pill 3 times daily at 11 AM, 5 PM and 10 PM daily. 270 tablet 3  . celecoxib (CELEBREX) 200 MG capsule Take 1 capsule (200 mg total) by mouth 2 (two) times daily as needed for moderate pain. 60 capsule 3  . cetirizine (ZYRTEC) 10 MG tablet Take 1 tablet (10 mg total) by mouth daily. 90 tablet 3  . CVS D3 50 MCG (2000 UT) CAPS TAKE 1 CAPSULE BY MOUTH EVERY DAY 100 capsule 3  . cyanocobalamin (,VITAMIN B-12,) 1000 MCG/ML injection Inject 1 mL (1,000 mcg total) into the skin every 14 (fourteen) days. 10 mL 4  . diclofenac Sodium (VOLTAREN) 1 % GEL Apply 4 g topically daily.    [name] docusate sodium (COLACE) 100 MG capsule SMARTSIG:1 Tablet(s) By Mouth Twice Daily    . donepezil (ARICEPT) 5 MG tablet Take 1 tablet (5 mg total) by mouth at bedtime. 90 tablet 3  . fluocinonide ointment (LIDEX) 6.27 % Apply 1 application topically 2 (two) times daily. 60 g 1  . hydrOXYzine (ATARAX/VISTARIL) 25 MG tablet Take 1 tablet (25 mg total) by mouth every 8 (eight) hours as needed for itching (itching from Butrans patch). (Patient taking differently: Take 25 mg by mouth 2 (two) times daily as needed for itching (itching from Butrans patch).) 60 tablet 0  . hydrOXYzine (VISTARIL) 25 MG capsule Take 25 mg by mouth 2 (two) times daily.    . montelukast (SINGULAIR) 10 MG tablet Take 1 tablet (10 mg total) by mouth daily. 90 tablet 3  . OVER THE  COUNTER MEDICATION 1 tablet daily. Lions mane extract    . pantoprazole (PROTONIX) 40 MG tablet Take 1 tablet (40 mg total) by mouth daily. 30 tablet 11  . polyethylene glycol (MIRALAX / GLYCOLAX) 17 g packet Take 17 g by mouth daily as needed.    . venlafaxine XR (EFFEXOR-XR) 150 MG 24 hr capsule TAKE 1 CAPSULE BY MOUTH  EVERY DAY WITH BREAKFAST 90 capsule 3  . verapamil (VERELAN PM) 120 MG 24 hr capsule Take 1 capsule (120 mg total) by mouth at bedtime. 30 capsule 11  . zolpidem (AMBIEN) 10 MG tablet Take 0.5-1 tablets (5-10 mg total) by mouth at bedtime as needed for sleep. 90 tablet 1   No facility-administered medications prior to visit.    ROS: Review of Systems  Constitutional: Negative for activity change, appetite change, chills, fatigue and unexpected weight change.  HENT: Negative for congestion, mouth sores and sinus pressure.   Eyes: Negative for visual disturbance.  Respiratory: Negative for cough and chest tightness.   Gastrointestinal: Negative for abdominal pain and nausea.  Genitourinary: Negative for difficulty urinating, frequency and vaginal pain.  Musculoskeletal: Positive for arthralgias, back pain and gait problem.  Skin: Negative for pallor and rash.  Neurological: Negative for dizziness, tremors, weakness, numbness and headaches.  Psychiatric/Behavioral: Negative for confusion  and sleep disturbance.    Objective:  BP 140/78 (BP Location: Left Arm)   Pulse 78   Temp 98.4 F (36.9 C) (Oral)   Ht 5' 1.5" (1.562 m)   Wt 168 lb 9.6 oz (76.5 kg)   SpO2 95%   BMI 31.34 kg/m   BP Readings from Last 3 Encounters:  06/25/20 140/78  05/22/20 (!) 150/89  03/27/20 (!) 150/80    Wt Readings from Last 3 Encounters:  06/25/20 168 lb 9.6 oz (76.5 kg)  05/22/20 165 lb (74.8 kg)  03/27/20 169 lb 9.6 oz (76.9 kg)    Physical Exam Constitutional:      General: She is not in acute distress.    Appearance: She is well-developed.  HENT:     Head: Normocephalic.      Right Ear: External ear normal.     Left Ear: External ear normal.     Nose: Nose normal.  Eyes:     General:        Right eye: No discharge.        Left eye: No discharge.     Conjunctiva/sclera: Conjunctivae normal.     Pupils: Pupils are equal, round, and reactive to light.  Neck:     Thyroid: No thyromegaly.     Vascular: No JVD.     Trachea: No tracheal deviation.  Cardiovascular:     Rate and Rhythm: Normal rate and regular rhythm.     Heart sounds: Normal heart sounds.  Pulmonary:     Effort: No respiratory distress.     Breath sounds: No stridor. No wheezing.  Abdominal:     General: Bowel sounds are normal. There is no distension.     Palpations: Abdomen is soft. There is no mass.     Tenderness: There is no abdominal tenderness. There is no guarding or rebound.  Musculoskeletal:        General: Tenderness present.     Cervical back: Normal range of motion and neck supple.  Lymphadenopathy:     Cervical: No cervical adenopathy.  Skin:    Findings: No erythema or rash.  Neurological:     Mental Status: She is oriented to person, place, and time.     Cranial Nerves: No cranial nerve deficit.     Motor: No abnormal muscle tone.     Coordination: Coordination abnormal.     Deep Tendon Reflexes: Reflexes normal.  Psychiatric:        Behavior: Behavior normal.        Thought Content: Thought content normal.        Judgment: Judgment normal.    Antalgic gait Using a walker  Lab Results  Component Value Date   WBC 8.5 09/11/2019   HGB 13.8 09/11/2019   HCT 42.7 09/11/2019   PLT 361 09/11/2019   GLUCOSE 93 09/11/2019   CHOL 219 (H) 02/05/2015   TRIG 69.0 02/05/2015   HDL 73.70 02/05/2015   LDLDIRECT 109.8 02/10/2010   LDLCALC 132 (H) 02/05/2015   ALT 3 06/12/2019   AST 14 06/12/2019   NA 145 09/11/2019   K 4.3 09/11/2019   CL 104 09/11/2019   CREATININE 0.91 09/11/2019   BUN 13 09/11/2019   CO2 35 (H) 09/11/2019   TSH 0.82 02/12/2017   INR 0.97  02/07/2014   HGBA1C 5.9 12/13/2019    MR BRAIN W WO CONTRAST  Result Date: 05/28/2020 CLINICAL DATA:  75 year old female with Parkinson's. Recent fall. Anosmia. EXAM: MRI HEAD WITHOUT AND  WITH CONTRAST TECHNIQUE: Multiplanar, multiecho pulse sequences of the brain and surrounding structures were obtained without and with intravenous contrast. CONTRAST:  72mL MULTIHANCE GADOBENATE DIMEGLUMINE 529 MG/ML IV SOLN COMPARISON:  CT paranasal sinuses 08/17/2005. FINDINGS: Brain: No restricted diffusion to suggest acute infarction. No midline shift, mass effect, evidence of mass lesion, ventriculomegaly, extra-axial collection or acute intracranial hemorrhage. Cervicomedullary junction and pituitary are within normal limits. Patchy and confluent bilateral cerebral white matter T2 and FLAIR hyperintensity. Fairly extensive involvement of the bilateral frontal and parietal lobes in a nonspecific configuration. No superimposed cortical encephalomalacia. No chronic cerebral blood products. Deep gray matter nuclei, brainstem and cerebellum are within normal limits for age. Overall cerebral volume seems normal for age. No abnormal enhancement identified.  No dural thickening. Vascular: Major intracranial vascular flow voids are preserved. Generalized intracranial artery tortuosity. The major dural venous sinuses are enhancing and appear to be patent. Skull and upper cervical spine: Mild for age visible cervical spine degeneration with disc disease at C3-C4 apparently causing mild spinal stenosis on series 2, image 11. Visualized bone marrow signal is within normal limits. Sinuses/Orbits: Postoperative changes to both globes. Otherwise negative orbits. Mild bilateral scattered paranasal sinus mucosal thickening. Small mucous retention cysts in both maxillary sinuses. Nasal cavity appears fairly negative. Both olfactory bulbs are difficult to identify on these routine images and could be atrophied (series 12, image 23). Other:  Mastoids are clear. Visible internal auditory structures appear normal. Visible scalp and face soft tissues appear negative. IMPRESSION: 1. No acute intracranial abnormality. 2. Moderately advanced but nonspecific cerebral white matter signal changes, most commonly due to chronic small vessel disease. 3. Olfactory bulbs are not identified suspicious for atrophy. 4. Mild paranasal sinus inflammation. 5. C3-C4 disc degeneration with mild cervical spinal stenosis. Electronically Signed   By: Genevie Ann M.D.   On: 05/28/2020 05:43    Assessment & Plan:    Walker Kehr, MD

## 2020-06-25 NOTE — Assessment & Plan Note (Signed)
Check A1c. 

## 2020-06-25 NOTE — Assessment & Plan Note (Signed)
Check CBC 

## 2020-06-25 NOTE — Telephone Encounter (Signed)
Rescheduled upcoming appointment due to provider on call. Patient is aware of changes. 

## 2020-06-25 NOTE — Assessment & Plan Note (Signed)
On B12 inj q 2 wks Check labs

## 2020-06-25 NOTE — Addendum Note (Signed)
Addended by: Jacobo Forest on: 06/25/2020 02:56 PM   Modules accepted: Orders

## 2020-06-25 NOTE — Assessment & Plan Note (Signed)
Pt gets up late On Zolpidem

## 2020-06-25 NOTE — Assessment & Plan Note (Signed)
Breo - pt stopped, Singulair

## 2020-06-25 NOTE — Assessment & Plan Note (Signed)
On Vit D 

## 2020-06-27 ENCOUNTER — Ambulatory Visit: Payer: Medicare Other | Admitting: Neurology

## 2020-06-28 ENCOUNTER — Telehealth: Payer: Self-pay | Admitting: Hematology

## 2020-06-28 NOTE — Telephone Encounter (Signed)
Left message with rescheduled upcoming appointment due to provider's breast clinic. Gave option to call back to reschedule if needed.

## 2020-07-03 ENCOUNTER — Other Ambulatory Visit: Payer: Medicare Other

## 2020-07-03 ENCOUNTER — Ambulatory Visit: Payer: Medicare Other | Admitting: Hematology

## 2020-07-04 ENCOUNTER — Ambulatory Visit: Payer: Medicare Other | Admitting: Hematology

## 2020-07-04 ENCOUNTER — Other Ambulatory Visit: Payer: Medicare Other

## 2020-07-04 DIAGNOSIS — G894 Chronic pain syndrome: Secondary | ICD-10-CM | POA: Diagnosis not present

## 2020-07-04 DIAGNOSIS — M48061 Spinal stenosis, lumbar region without neurogenic claudication: Secondary | ICD-10-CM | POA: Diagnosis not present

## 2020-07-04 DIAGNOSIS — M47816 Spondylosis without myelopathy or radiculopathy, lumbar region: Secondary | ICD-10-CM | POA: Diagnosis not present

## 2020-07-04 DIAGNOSIS — M1712 Unilateral primary osteoarthritis, left knee: Secondary | ICD-10-CM | POA: Diagnosis not present

## 2020-07-12 ENCOUNTER — Inpatient Hospital Stay: Payer: BC Managed Care – PPO

## 2020-07-12 ENCOUNTER — Inpatient Hospital Stay: Payer: BC Managed Care – PPO | Admitting: Hematology

## 2020-07-17 ENCOUNTER — Other Ambulatory Visit: Payer: Self-pay | Admitting: Hematology

## 2020-07-17 DIAGNOSIS — E538 Deficiency of other specified B group vitamins: Secondary | ICD-10-CM

## 2020-07-17 NOTE — Progress Notes (Signed)
Mallory Klein   [phone]) [phone] [phone]) [phone]   Clinic Follow up Note   Patient Care Team: Plotnikov, Evie Lacks, MD as PCP - General Magda Bernheim, MD as Referring Physician (Orthopedic Surgery) Irene Shipper, MD (Gastroenterology) Eustace Moore, MD as Consulting Physician (Neurosurgery) Clydell Hakim, MD (Inactive) as Consulting Physician (Pain Medicine) Truitt Merle, MD as Consulting Physician (Hematology) Alla Feeling, NP as Nurse Practitioner (Nurse Practitioner) Tat, Eustace Quail, DO as Consulting Physician (Neurology) Margaretha Sheffield, MD as Referring Physician (Physical Medicine and Rehabilitation) Marilynne Halsted, MD as Referring Physician (Ophthalmology)  Date of Service:  07/18/2020  CHIEF COMPLAINT: F/u of Iron deficiency anemia, thrombocytosis   CURRENT THERAPY:  -Oral iron supplement 1 tab dailystartedon 03/19/17, increased to 1 tab BID on 04/02/17; s/p IV Feraheme 05/03/17, 05/11/17 -IV B12 injections with her PCP   INTERVAL HISTORY:  Mallory Klein is here for a follow up of Iron deficiency anemia and thrombocytosis. She was last seen by me 1 year ago. She presents to the clinic with her husband.  She is clinical stable, has tremor from Parkinson disease, has chronic pain, knee and shoulder pain  Energy stable, overall low  Appetite is low, lost about 3 lbs in past year  She is on B12 injection at home every 2 weeks   All other systems were reviewed with the patient and are negative.  MEDICAL HISTORY:  Past Medical History:  Diagnosis Date  . Abnormal CBC 02/05/2015   [name] 12/16 new: normocytic anemia and thrombocytosis  . Adjustment disorder with mixed anxiety and depressed mood 02/15/2007   Chronic  Chronic pain Grief, stress Effexor XR  Dad died in 03-11-2023  . Asthma   . Diverticulosis of colon (without mention of hemorrhage)   . Esophageal stricture   . Essential hypertension 01/26/2007   Chronic Verapamil   . GERD  (gastroesophageal reflux disease) 02/15/2007   Chronic    . Heart murmur   . Hyperglycemia 03/02/2011   Mild    . Insomnia disorder 12/08/2018   10/20 Carbid/Lev dose was increased - c/o hard time falling asleep (6 am) getting up at 12 am, poor sleep. Try Temazepam 15-30 mg at 11-1:30 pm   . Internal hemorrhoids without mention of complication   . Iron deficiency anemia 04/23/2017  . Mild neurocognitive disorder, likely due to Parkinson's disease 02/09/2019  . OSA (obstructive sleep apnea) 02/14/2010   In lab study (May 2018): AHI 25. Only had 45 minutes of sleep secondary to frequent awakenings secondary to sleep apnea. autocpap 5-15 cm water.   . Osteoarthritis   . Parkinson disease ([place]) 03/10/14   2015 2017  Primidone - d/c 2019 Parkinson's: [facility] IR. Dr Tat  . Postherpetic trigeminal neuralgia 01/26/2007   Qualifier: Diagnosis of  By: Marca Ancona RMA, [name]    . Shortness of breath dyspnea    with allergies  . Thrombocytosis 05/24/2017  . Urinary incontinence 01/26/2007   Chronic  10/17 Detrol LA   . Vitamin D deficiency 01/26/2019    SURGICAL HISTORY: Past Surgical History:  Procedure Laterality Date  . ABDOMINAL HYSTERECTOMY    . BREAST CYST EXCISION Left 1980's  . LUMBAR LAMINECTOMY/DECOMPRESSION MICRODISCECTOMY Bilateral 02/20/2014   Procedure: LUMBAR TWO THREE, LUMBAR THREE FOUR LUMBAR LAMINECTOMY/DECOMPRESSION MICRODISCECTOMY 2 LEVELS;  Surgeon: Eustace Moore, MD;  Location: [facility];  Service: Neurosurgery;  Laterality: Bilateral;  . SPLENECTOMY    . TIBIA FRACTURE SURGERY Right   . TIBIA FRACTURE SURGERY Left   .  TOTAL KNEE ARTHROPLASTY Right 2013    I have reviewed the social history and family history with the patient and they are unchanged from previous note.  ALLERGIES:  is allergic to ace inhibitors, benicar [olmesartan], aspirin, and citalopram hydrobromide.  MEDICATIONS:  Current Outpatient Medications  Medication Sig Dispense Refill  . albuterol (VENTOLIN HFA)  108 (90 Base) MCG/ACT inhaler Inhale 2 puffs into the lungs every 6 (six) hours as needed for wheezing or shortness of breath. 18 g 11  . buprenorphine (BUTRANS) 7.5 MCG/HR Place 1 patch onto the skin once a week. 4 patch 0  . carbidopa-levodopa (SINEMET CR) 50-200 MG tablet Take 1 pill 3 times daily at 11 AM, 5 PM and 10 PM daily. 270 tablet 3  . celecoxib (CELEBREX) 200 MG capsule Take 1 capsule (200 mg total) by mouth 2 (two) times daily as needed for moderate pain. 60 capsule 3  . cetirizine (ZYRTEC) 10 MG tablet Take 1 tablet (10 mg total) by mouth daily. 90 tablet 3  . [employer] D3 50 MCG (2000 UT) CAPS TAKE 1 CAPSULE BY MOUTH EVERY DAY 100 capsule 3  . cyanocobalamin (,VITAMIN B-12,) 1000 MCG/ML injection Inject 1 mL (1,000 mcg total) into the skin every 14 (fourteen) days. 10 mL 4  . diclofenac Sodium (VOLTAREN) 1 % GEL Apply 4 g topically daily.    [name] docusate sodium (COLACE) 100 MG capsule SMARTSIG:1 Tablet(s) By Mouth Twice Daily    . donepezil (ARICEPT) 5 MG tablet Take 1 tablet (5 mg total) by mouth at bedtime. 90 tablet 3  . fluocinonide ointment (LIDEX) 9.32 % Apply 1 application topically 2 (two) times daily. 60 g 1  . hydrOXYzine (ATARAX/VISTARIL) 25 MG tablet Take 1 tablet (25 mg total) by mouth every 8 (eight) hours as needed for itching (itching from Butrans patch). (Patient taking differently: Take 25 mg by mouth 2 (two) times daily as needed for itching (itching from Butrans patch).) 60 tablet 0  . hydrOXYzine (VISTARIL) 25 MG capsule Take 25 mg by mouth 2 (two) times daily.    . montelukast (SINGULAIR) 10 MG tablet Take 1 tablet (10 mg total) by mouth daily. 90 tablet 3  . OVER THE COUNTER MEDICATION 1 tablet daily. Lions mane extract    . pantoprazole (PROTONIX) 40 MG tablet Take 1 tablet (40 mg total) by mouth daily. 30 tablet 11  . polyethylene glycol (MIRALAX / GLYCOLAX) 17 g packet Take 17 g by mouth daily as needed.    . venlafaxine XR (EFFEXOR-XR) 150 MG 24 hr capsule TAKE 1  CAPSULE BY MOUTH  EVERY DAY WITH BREAKFAST 90 capsule 3  . verapamil (VERELAN PM) 120 MG 24 hr capsule Take 1 capsule (120 mg total) by mouth at bedtime. 30 capsule 11  . zolpidem (AMBIEN) 10 MG tablet Take 0.5-1 tablets (5-10 mg total) by mouth at bedtime as needed for sleep. 90 tablet 1   No current facility-administered medications for this visit.    PHYSICAL EXAMINATION: ECOG PERFORMANCE STATUS: 2 - Symptomatic, <50% confined to bed  Vitals:   07/18/20 1500  BP: (!) 169/99  Pulse: 86  Resp: 18  Temp: 98.3 F (36.8 C)  SpO2: 97%   Filed Weights   07/18/20 1500  Weight: 167 lb (75.8 kg)   GENERAL:alert, no distress and comfortable SKIN: skin color, texture, turgor are normal, no rashes or significant lesions EYES: normal, Conjunctiva are pink and non-injected, sclera clear Musculoskeletal:no cyanosis of digits and no clubbing  NEURO: alert & oriented  x 3 with fluent speech, no focal motor/sensory deficits  LABORATORY DATA:  I have reviewed the data as listed CBC Latest Ref Rng & Units 07/18/2020 06/25/2020 09/11/2019  WBC 4.0 - 10.5 K/uL 9.0 7.7 8.5  Hemoglobin 12.0 - 15.0 g/dL 13.6 13.3 13.8  Hematocrit 36.0 - 46.0 % 42.1 41.2 42.7  Platelets 150 - 400 K/uL 370 368.0 361     CMP Latest Ref Rng & Units 06/25/2020 09/11/2019 06/12/2019  Glucose 70 - 99 mg/dL 99 93 88  BUN 6 - 23 mg/dL '15 13 19  ' Creatinine 0.40 - 1.20 mg/dL 0.85 0.91 1.01  Sodium 135 - 145 mEq/L 138 145 141  Potassium 3.5 - 5.1 mEq/L 4.2 4.3 4.2  Chloride 96 - 112 mEq/L 100 104 103  CO2 19 - 32 mEq/L 34(H) 35(H) 31  Calcium 8.4 - 10.5 mg/dL 9.0 9.9 9.3  Total Protein 6.0 - 8.3 g/dL 7.2 - 7.3  Total Bilirubin 0.2 - 1.2 mg/dL 0.6 - 0.4  Alkaline Phos 39 - 117 U/L 60 - 66  AST 0 - 37 U/L 11 - 14  ALT 0 - 35 U/L 2 - 3      RADIOGRAPHIC STUDIES: I have personally reviewed the radiological images as listed and agreed with the findings in the report. No results found.   ASSESSMENT & PLAN:  Mallory Klein is a 75 y.o. female with     1. Anemia, iron deficiency -She presented for chronic anemia for at least 2 years, and recent moderate thrombocytosis.  -Pt labs from 1/25/19was consistent with iron deficient anemia. I advised her begin oral iron supplement daily since 02/2017. After 4 weeks. She only required IV Feraheme on 05/03/17 and 05/11/17.  -She responded very well, anemia resolved. She is solely on oral iron once daily.  -She has not had anemia for over 3 years, I will see her as needed in future   2. Thrombocytosis, intermittent leukocytosis  -She is S/p splenectomy. Platelets were normal for period of time from 2011 to 2013 but have been consistently elevated since then in 500K range.  -Her smear from 3/19 has moderate target, few teardrop, ovalocytes, and shistocytes,She had abone marrow biopsy surgical pathology 05/19/17 showed slightly hypocellular marrow, no morphological evidence of MDS. I discussed with Dr. Gari Crown, the morphology of bone marrow was not impressive for MPN, also early stage MPN can not be ruled out given the marked hypercellular marrow.  -BM cytogenetics test isnormalandMPN genetic panel including JAK2 was negative -Thrombocytosis and leukocytosis resolved, likely reactive  -Follow-up as needed   3. B12 deficiency  -Her labs with PCP from 06/12/19 shows B12 168.  -She was started on B12 injections in 06/16/19 and has received 3 injections so far and fatigue has improved. She will continue to f/u with her PCP.  -I discussed depending on etiology she may or may nor respond to oral B12. If not responsive she may be on B12 injections indefinitely.    4. HTN, asthma -Continue f/u with PCP Dr. Alain Marion  5. Cancer Screening -her last colonoscopy was 5 years ago with a normal results. -No family history of colon cancer -She went to see a gastroenterologist, Dr. Gaspar Bidding underwent a screening colonoscopy in February 2020  6. Parkinsons -She  saw a neurologist, [name]  -She is oncarbidopa/levodopaand was referred to a Parkinson's program by her neurologist   7. Back Pain -She is seeing [facility] and is considering surgery -She is oninjections BDS Gabapentin -She is also  seeing a pain specialist for this as well    PLAN -Lab reviewed, CBC and CMP are unremarkable -Continue home B12 injection every 2 weeks -Continue oral iron once daily -Since she has not had anemia for 3 years, I will see her as needed in future   No problem-specific Assessment & Plan notes found for this encounter.   No orders of the defined types were placed in this encounter.  All questions were answered. The patient knows to call the clinic with any problems, questions or concerns. No barriers to learning was detected. The total time spent in the appointment was 20 minutes.     Truitt Merle, MD 07/18/2020   I, Joslyn Devon, am acting as scribe for Truitt Merle, MD.   I have reviewed the above documentation for accuracy and completeness, and I agree with the above.

## 2020-07-18 ENCOUNTER — Encounter: Payer: Self-pay | Admitting: Hematology

## 2020-07-18 ENCOUNTER — Other Ambulatory Visit: Payer: Self-pay

## 2020-07-18 ENCOUNTER — Inpatient Hospital Stay: Payer: Medicare Other | Attending: Hematology

## 2020-07-18 ENCOUNTER — Inpatient Hospital Stay (HOSPITAL_BASED_OUTPATIENT_CLINIC_OR_DEPARTMENT_OTHER): Payer: Medicare Other | Admitting: Hematology

## 2020-07-18 VITALS — BP 169/99 | HR 86 | Temp 98.3°F | Resp 18 | Ht 61.5 in | Wt 167.0 lb

## 2020-07-18 DIAGNOSIS — D75839 Thrombocytosis, unspecified: Secondary | ICD-10-CM

## 2020-07-18 DIAGNOSIS — Z8719 Personal history of other diseases of the digestive system: Secondary | ICD-10-CM | POA: Insufficient documentation

## 2020-07-18 DIAGNOSIS — E538 Deficiency of other specified B group vitamins: Secondary | ICD-10-CM | POA: Insufficient documentation

## 2020-07-18 DIAGNOSIS — D500 Iron deficiency anemia secondary to blood loss (chronic): Secondary | ICD-10-CM

## 2020-07-18 DIAGNOSIS — M25519 Pain in unspecified shoulder: Secondary | ICD-10-CM | POA: Insufficient documentation

## 2020-07-18 DIAGNOSIS — Z886 Allergy status to analgesic agent status: Secondary | ICD-10-CM | POA: Insufficient documentation

## 2020-07-18 DIAGNOSIS — G20 Parkinson's disease: Secondary | ICD-10-CM | POA: Diagnosis not present

## 2020-07-18 DIAGNOSIS — D509 Iron deficiency anemia, unspecified: Secondary | ICD-10-CM | POA: Diagnosis not present

## 2020-07-18 DIAGNOSIS — G8929 Other chronic pain: Secondary | ICD-10-CM | POA: Diagnosis not present

## 2020-07-18 DIAGNOSIS — Z79899 Other long term (current) drug therapy: Secondary | ICD-10-CM | POA: Diagnosis not present

## 2020-07-18 DIAGNOSIS — D508 Other iron deficiency anemias: Secondary | ICD-10-CM

## 2020-07-18 DIAGNOSIS — D649 Anemia, unspecified: Secondary | ICD-10-CM

## 2020-07-18 DIAGNOSIS — Z9081 Acquired absence of spleen: Secondary | ICD-10-CM | POA: Insufficient documentation

## 2020-07-18 DIAGNOSIS — M549 Dorsalgia, unspecified: Secondary | ICD-10-CM | POA: Diagnosis not present

## 2020-07-18 DIAGNOSIS — D72829 Elevated white blood cell count, unspecified: Secondary | ICD-10-CM | POA: Insufficient documentation

## 2020-07-18 DIAGNOSIS — I10 Essential (primary) hypertension: Secondary | ICD-10-CM | POA: Insufficient documentation

## 2020-07-18 LAB — CBC WITH DIFFERENTIAL (CANCER CENTER ONLY)
Abs Immature Granulocytes: 0.02 10*3/uL (ref 0.00–0.07)
Basophils Absolute: 0.1 10*3/uL (ref 0.0–0.1)
Basophils Relative: 1 %
Eosinophils Absolute: 0.2 10*3/uL (ref 0.0–0.5)
Eosinophils Relative: 2 %
HCT: 42.1 % (ref 36.0–46.0)
Hemoglobin: 13.6 g/dL (ref 12.0–15.0)
Immature Granulocytes: 0 %
Lymphocytes Relative: 27 %
Lymphs Abs: 2.4 10*3/uL (ref 0.7–4.0)
MCH: 28.9 pg (ref 26.0–34.0)
MCHC: 32.3 g/dL (ref 30.0–36.0)
MCV: 89.6 fL (ref 80.0–100.0)
Monocytes Absolute: 0.6 10*3/uL (ref 0.1–1.0)
Monocytes Relative: 7 %
Neutro Abs: 5.7 10*3/uL (ref 1.7–7.7)
Neutrophils Relative %: 63 %
Platelet Count: 370 10*3/uL (ref 150–400)
RBC: 4.7 MIL/uL (ref 3.87–5.11)
RDW: 15.4 % (ref 11.5–15.5)
WBC Count: 9 10*3/uL (ref 4.0–10.5)
nRBC: 0 % (ref 0.0–0.2)

## 2020-07-18 LAB — VITAMIN B12: Vitamin B-12: 939 pg/mL — ABNORMAL HIGH (ref 180–914)

## 2020-07-18 LAB — IRON AND TIBC
Iron: 127 ug/dL (ref 41–142)
Saturation Ratios: 43 % (ref 21–57)
TIBC: 295 ug/dL (ref 236–444)
UIBC: 168 ug/dL (ref 120–384)

## 2020-07-18 LAB — RETICULOCYTES
Immature Retic Fract: 13.7 % (ref 2.3–15.9)
RBC.: 4.71 MIL/uL (ref 3.87–5.11)
Retic Count, Absolute: 65 10*3/uL (ref 19.0–186.0)
Retic Ct Pct: 1.4 % (ref 0.4–3.1)

## 2020-07-18 LAB — FERRITIN: Ferritin: 75 ng/mL (ref 11–307)

## 2020-07-20 ENCOUNTER — Emergency Department (HOSPITAL_COMMUNITY)
Admission: EM | Admit: 2020-07-20 | Discharge: 2020-07-21 | Disposition: A | Discharge status: Home / Self Care | Payer: Medicare Other | Attending: Emergency Medicine | Admitting: Emergency Medicine

## 2020-07-20 DIAGNOSIS — Z743 Need for continuous supervision: Secondary | ICD-10-CM | POA: Diagnosis not present

## 2020-07-20 DIAGNOSIS — Z96651 Presence of right artificial knee joint: Secondary | ICD-10-CM | POA: Insufficient documentation

## 2020-07-20 DIAGNOSIS — S0003XA Contusion of scalp, initial encounter: Secondary | ICD-10-CM | POA: Diagnosis not present

## 2020-07-20 DIAGNOSIS — W010XXA Fall on same level from slipping, tripping and stumbling without subsequent striking against object, initial encounter: Secondary | ICD-10-CM | POA: Diagnosis not present

## 2020-07-20 DIAGNOSIS — J45909 Unspecified asthma, uncomplicated: Secondary | ICD-10-CM | POA: Insufficient documentation

## 2020-07-20 DIAGNOSIS — Z79899 Other long term (current) drug therapy: Secondary | ICD-10-CM | POA: Insufficient documentation

## 2020-07-20 DIAGNOSIS — W19XXXA Unspecified fall, initial encounter: Secondary | ICD-10-CM

## 2020-07-20 DIAGNOSIS — I10 Essential (primary) hypertension: Secondary | ICD-10-CM | POA: Diagnosis not present

## 2020-07-20 DIAGNOSIS — S0990XA Unspecified injury of head, initial encounter: Secondary | ICD-10-CM | POA: Diagnosis not present

## 2020-07-20 DIAGNOSIS — R0902 Hypoxemia: Secondary | ICD-10-CM | POA: Diagnosis not present

## 2020-07-20 DIAGNOSIS — S0083XA Contusion of other part of head, initial encounter: Secondary | ICD-10-CM | POA: Insufficient documentation

## 2020-07-20 DIAGNOSIS — G20 Parkinson's disease: Secondary | ICD-10-CM | POA: Insufficient documentation

## 2020-07-20 DIAGNOSIS — R6889 Other general symptoms and signs: Secondary | ICD-10-CM | POA: Diagnosis not present

## 2020-07-20 DIAGNOSIS — M47812 Spondylosis without myelopathy or radiculopathy, cervical region: Secondary | ICD-10-CM | POA: Diagnosis not present

## 2020-07-20 DIAGNOSIS — Z043 Encounter for examination and observation following other accident: Secondary | ICD-10-CM | POA: Diagnosis not present

## 2020-07-21 ENCOUNTER — Other Ambulatory Visit: Payer: Self-pay

## 2020-07-21 ENCOUNTER — Emergency Department (HOSPITAL_COMMUNITY): Payer: Medicare Other

## 2020-07-21 ENCOUNTER — Encounter (HOSPITAL_COMMUNITY): Payer: Self-pay

## 2020-07-21 DIAGNOSIS — S0083XA Contusion of other part of head, initial encounter: Secondary | ICD-10-CM | POA: Diagnosis not present

## 2020-07-21 DIAGNOSIS — M47812 Spondylosis without myelopathy or radiculopathy, cervical region: Secondary | ICD-10-CM | POA: Diagnosis not present

## 2020-07-21 DIAGNOSIS — S0003XA Contusion of scalp, initial encounter: Secondary | ICD-10-CM | POA: Diagnosis not present

## 2020-07-21 DIAGNOSIS — Z043 Encounter for examination and observation following other accident: Secondary | ICD-10-CM | POA: Diagnosis not present

## 2020-07-21 IMAGING — CT CT CERVICAL SPINE W/O CM
5 series · 16 of 35 positions shown, 18 images · non-contrast
Comparison: None.

CLINICAL DATA: Fell, left frontal scalp hematoma.

EXAM:
CT CERVICAL SPINE WITHOUT CONTRAST
TECHNIQUE: Multidetector CT imaging of the cervical spine was performed without
intravenous contrast. Multiplanar CT image reconstructions were also
generated.

[Series 3: c spine bone · axial · 0.40mm/px · z∈[-230,-150]mm · 3 of 80 slices shown, 4 images]
[im 20/80  soft-tissue]
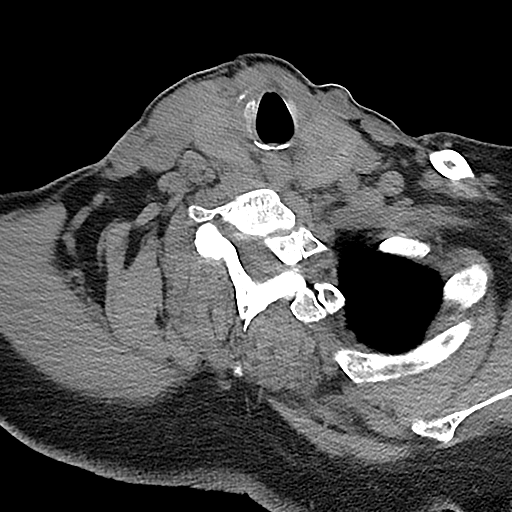
[im 20/80  bone]
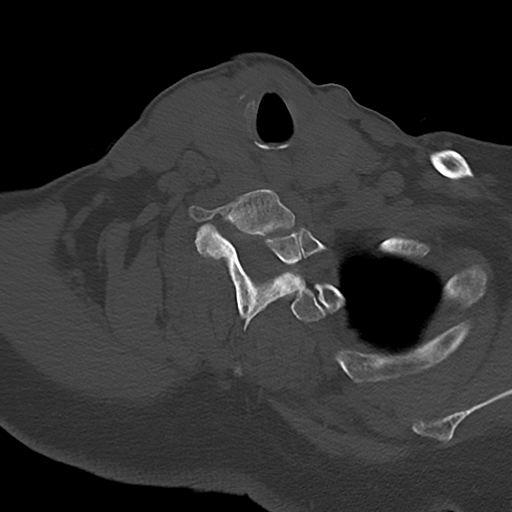
[im 40/80  bone]
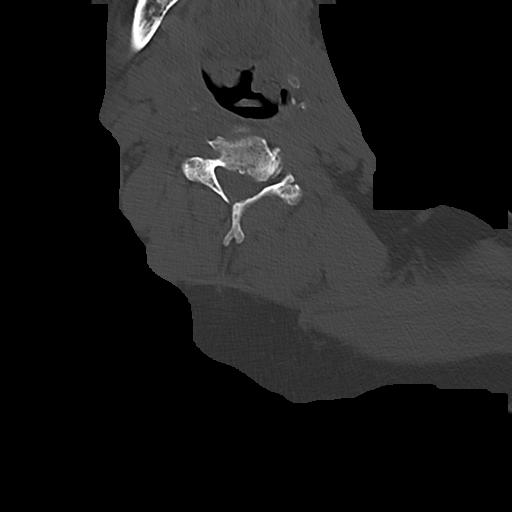
[im 60/80  bone]
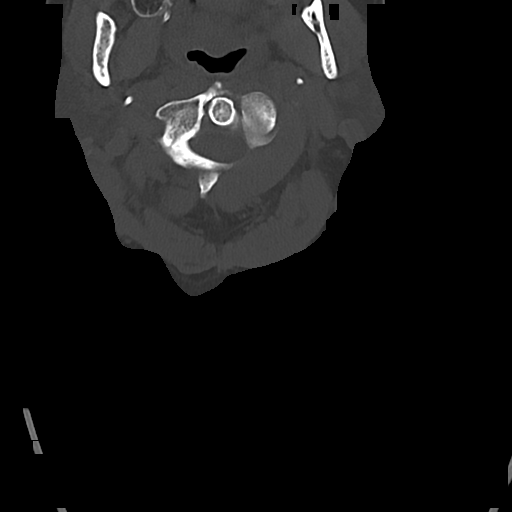

[Series 4: c spine soft · axial · 0.40mm/px · z∈[-212,-162]mm · 2 of 77 slices shown]
[im 26/77  soft-tissue]
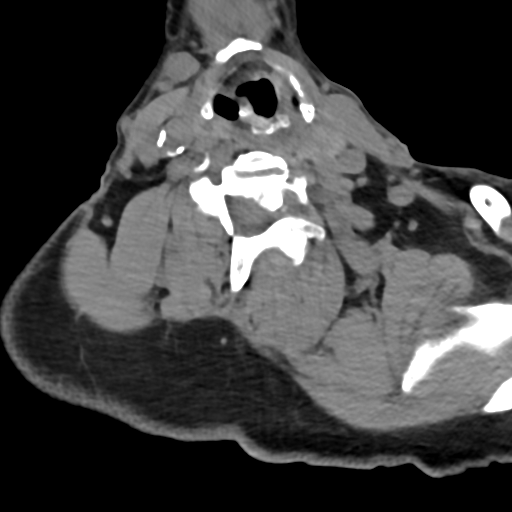
[im 51/77  soft-tissue]
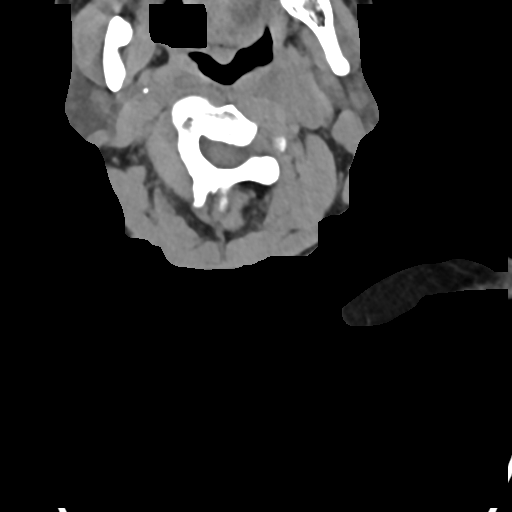

[Series 7: sag bone · sagittal · 0.34mm/px · 5 of 104 slices shown, 6 images]
[im 35/104  bone]
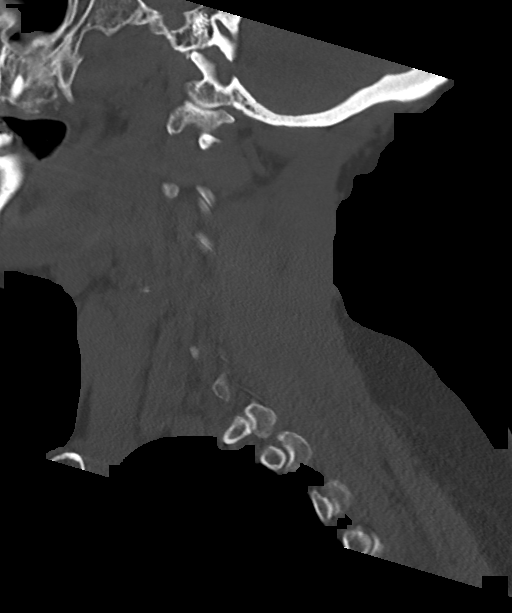
[im 43/104  bone]
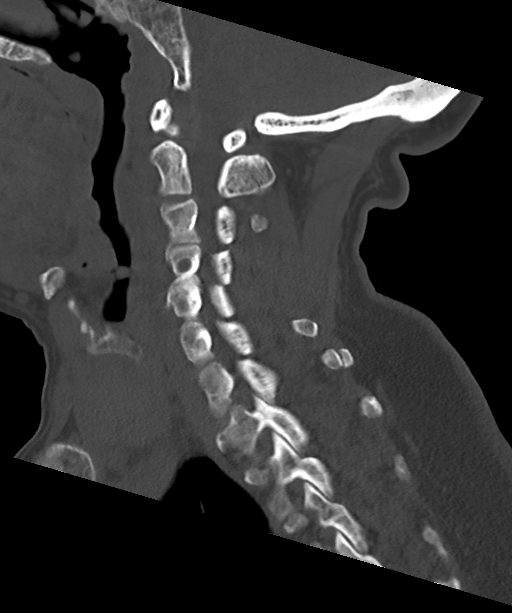
[im 52/104  soft-tissue]
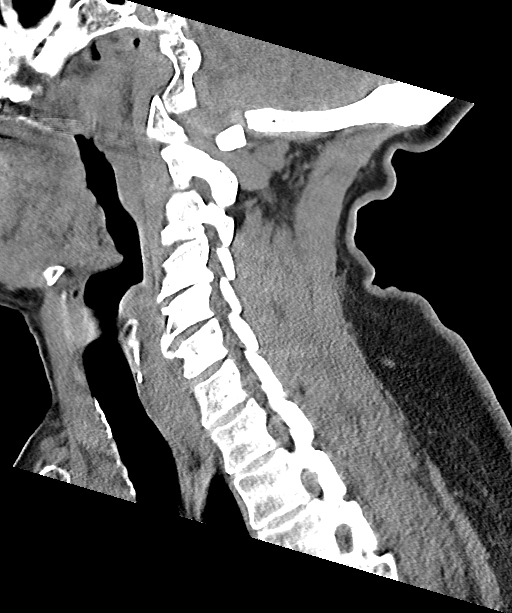
[im 52/104  bone]
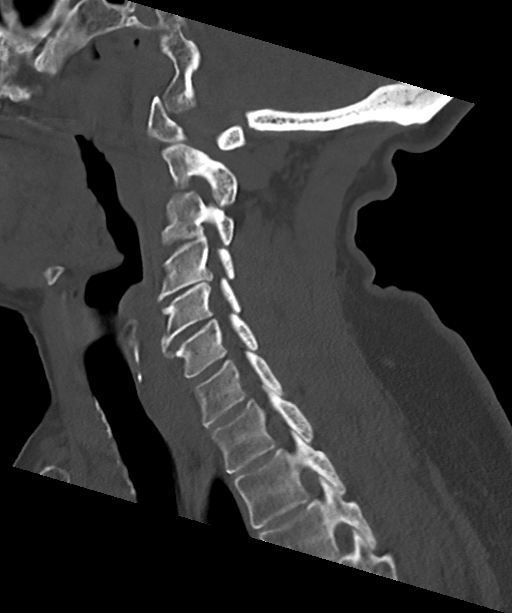
[im 61/104  bone]
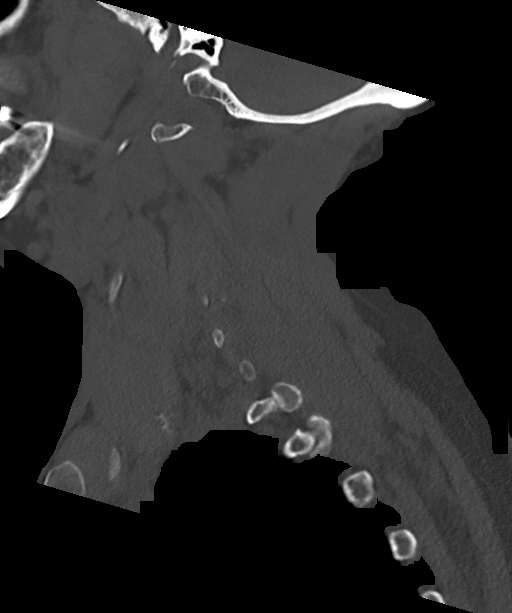
[im 69/104  bone]
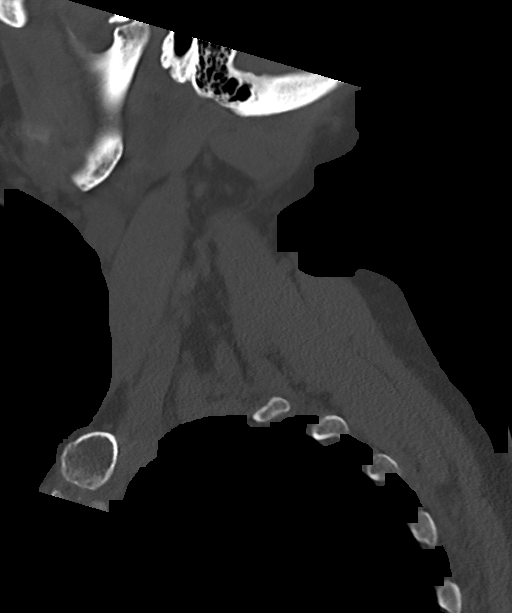

[Series 8: cor bone · coronal · 0.36mm/px · 3 of 78 slices shown]
[im 18/78  bone]
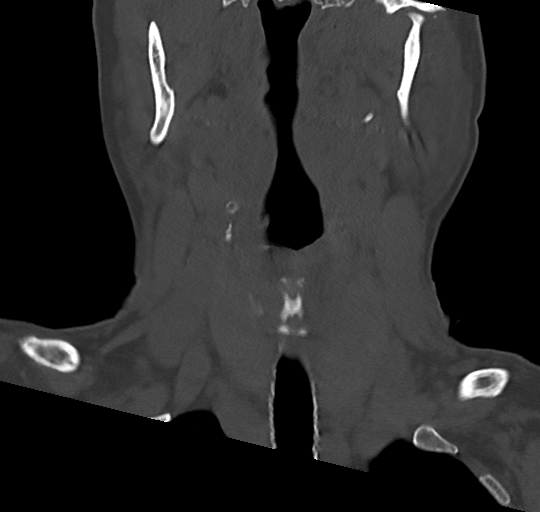
[im 32/78  bone]
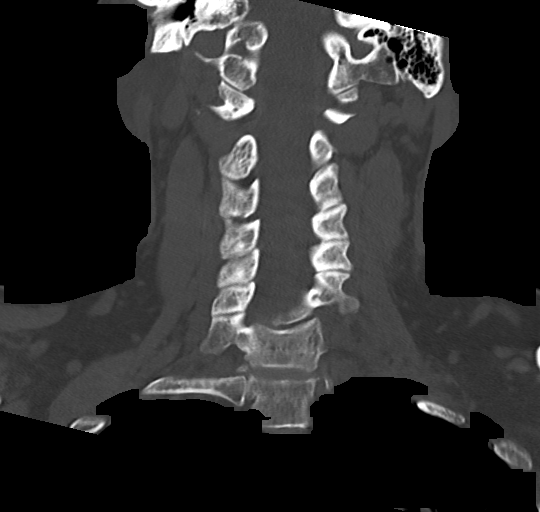
[im 46/78  bone]
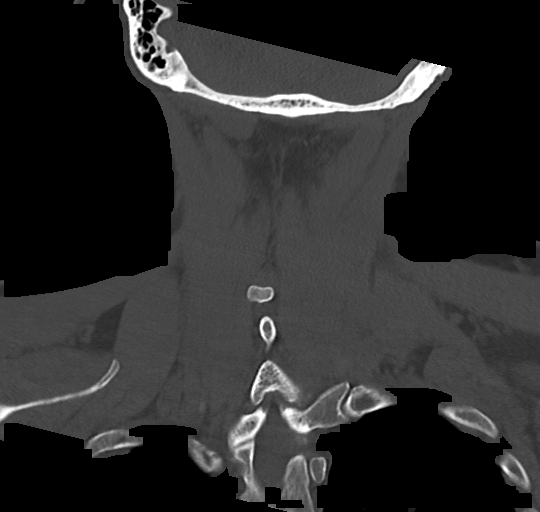

[Series 10: orthogonal axials · oblique · 0.21mm/px · 3 of 85 slices shown]
[im 22/85  bone]
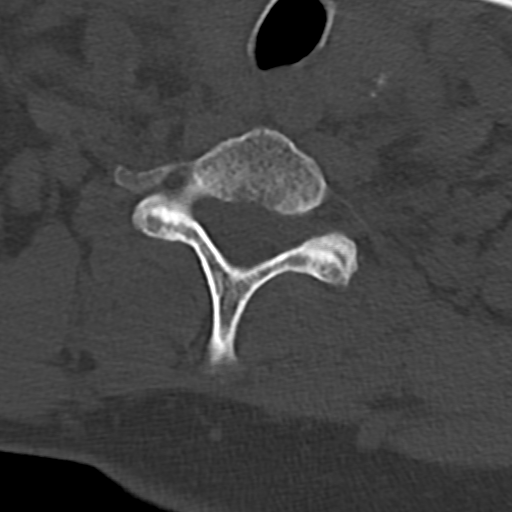
[im 43/85  bone]
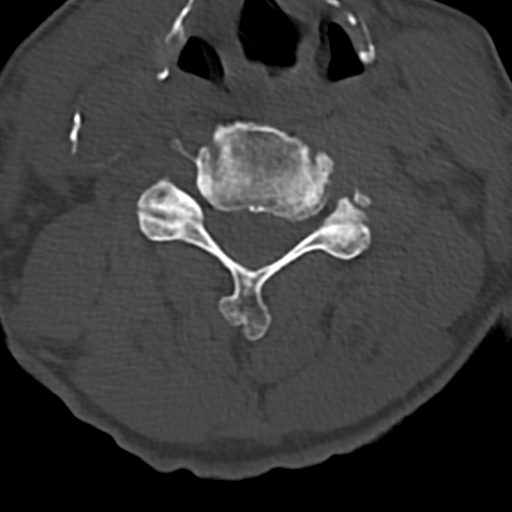
[im 64/85  bone]
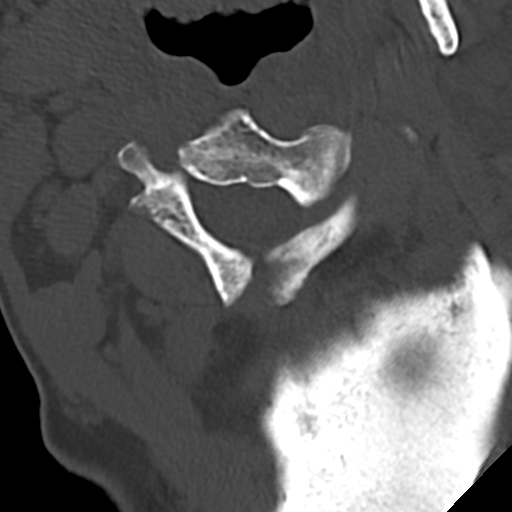

[16 of 35 positions shown; findings below may reference images not displayed]

FINDINGS: Alignment: Normal.

Skull base and vertebrae: No acute fracture. No primary bone lesion
or focal pathologic process.

Soft tissues and spinal canal: No prevertebral fluid or swelling. No
visible canal hematoma.

Disc levels: Mild multilevel spondylosis with disc space narrowing
and anterior osteophyte most pronounced at C4-5 and C5-6.
Significant symmetrical neural foraminal encroachment at C4-5 as
result of disc osteophyte complex and uncovertebral hypertrophy.

Upper chest: Airway is patent.  Lung apices are clear.

Other: Reconstructed images demonstrate no additional findings.
IMPRESSION: 1. No acute cervical spine fracture.
2. Multilevel spondylosis, greatest at C4-5.

## 2020-07-21 IMAGING — CT CT HEAD W/O CM
4 series · 15 of 47 positions shown, 17 images · non-contrast
Comparison: [DATE]

CLINICAL DATA: Parkinson's, fell, left frontal scalp swelling

EXAM:
CT HEAD WITHOUT CONTRAST
TECHNIQUE: Contiguous axial images were obtained from the base of the skull
through the vertex without intravenous contrast.

[Series 3: head wo · axial · 0.43mm/px · z∈[-127,-22]mm · 6 of 31 slices shown, 8 images]
[im 5/31  brain]
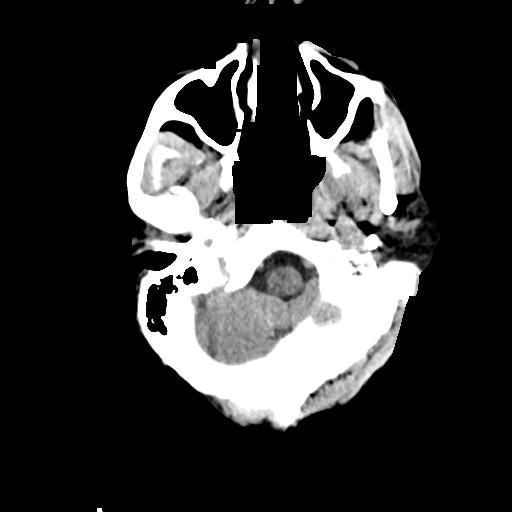
[im 5/31  bone]
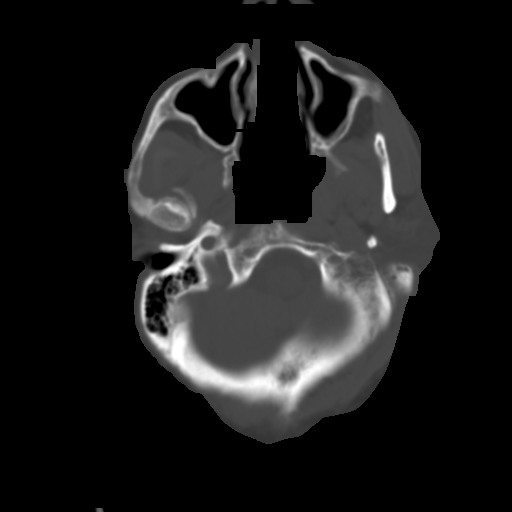
[im 9/31  brain]
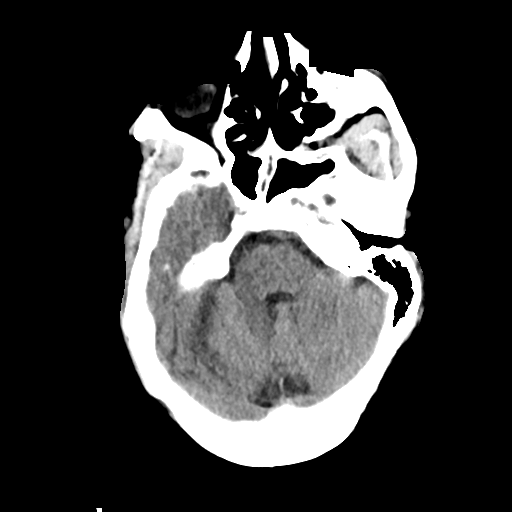
[im 13/31  brain]
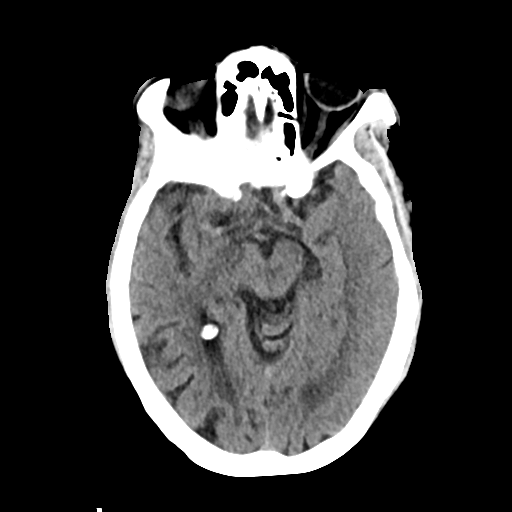
[im 18/31  brain]
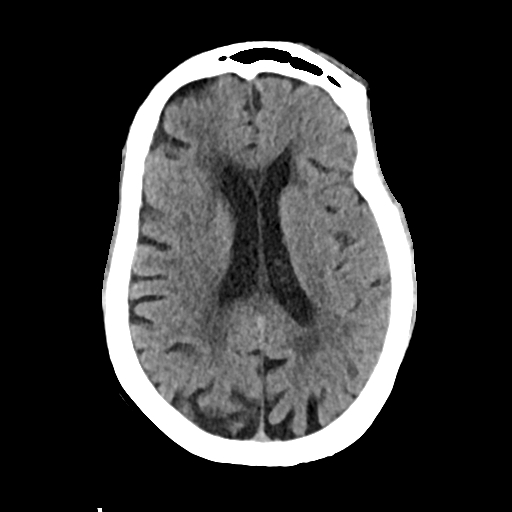
[im 22/31  brain]
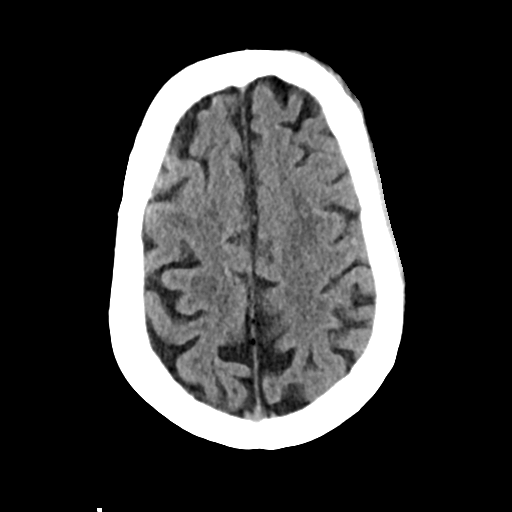
[im 22/31  bone]
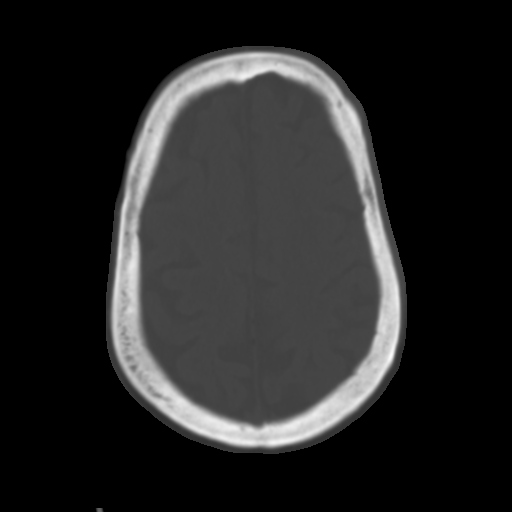
[im 26/31  brain]
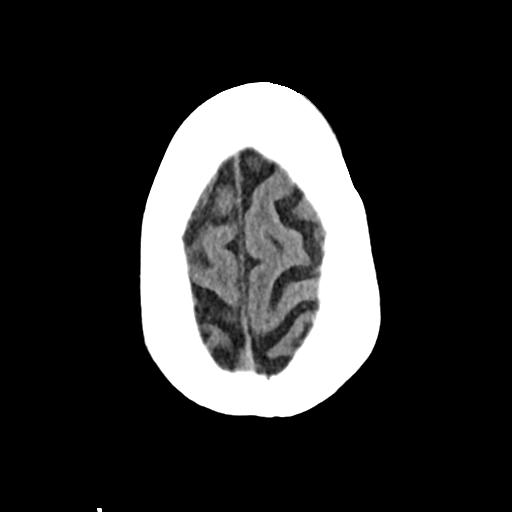

[Series 4: head bone · axial · 0.43mm/px · z∈[-133,-95]mm · 3 of 79 slices shown]
[im 8/79  bone]
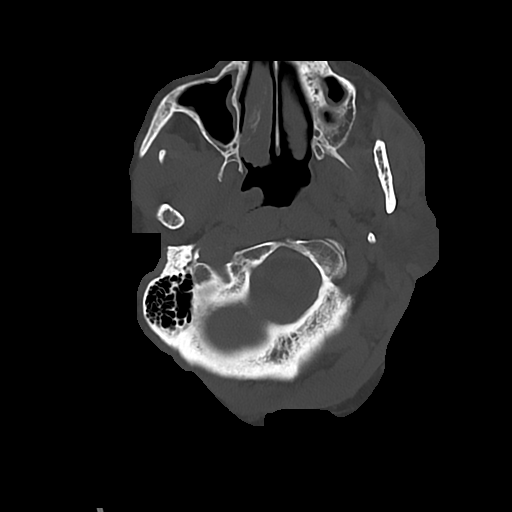
[im 15/79  bone]
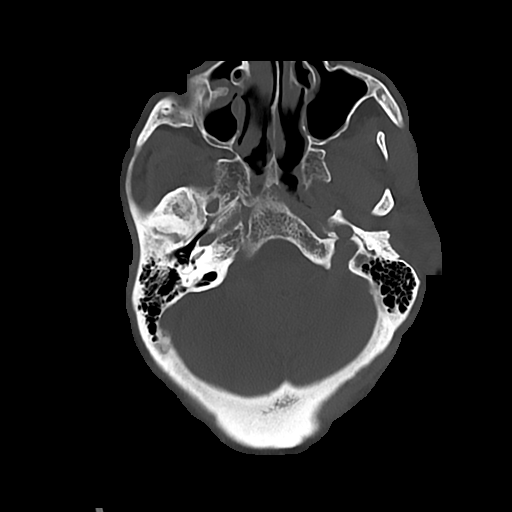
[im 27/79  bone]
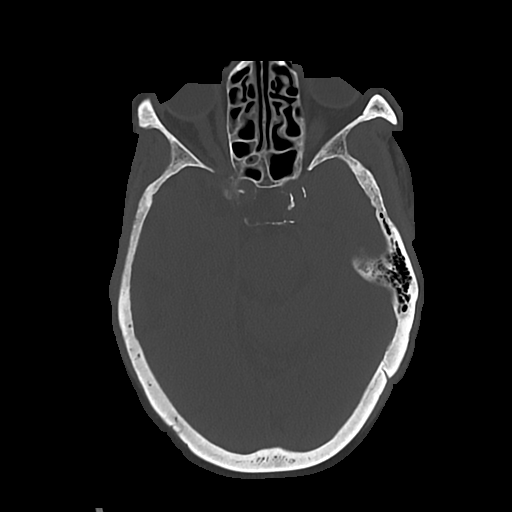

[Series 5: cor soft · coronal · 0.28mm/px · 3 of 74 slices shown]
[im 25/74  brain]
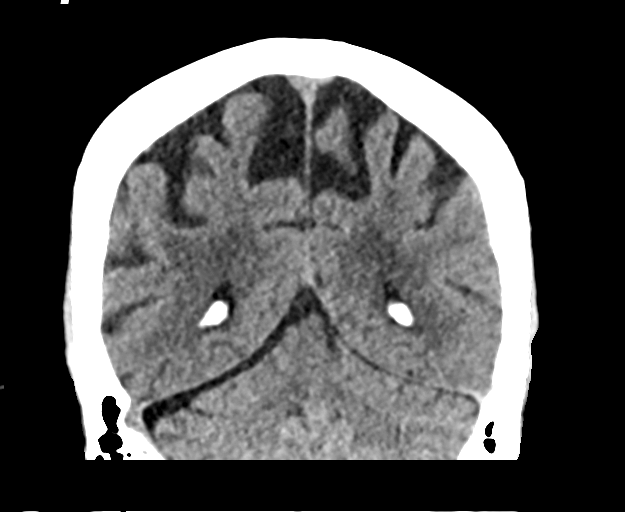
[im 33/74  brain]
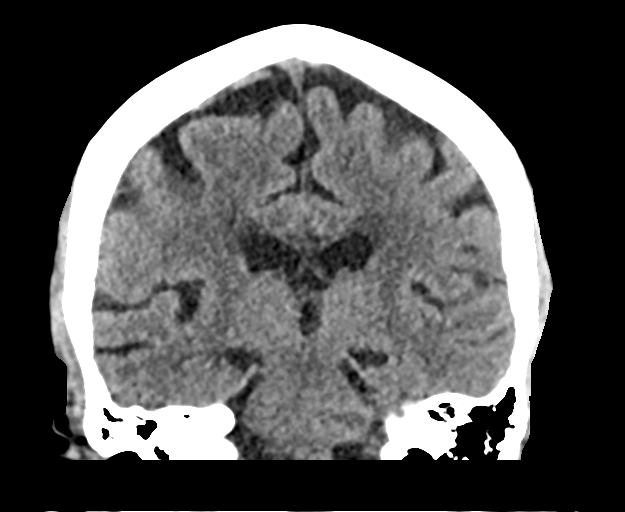
[im 41/74  brain]
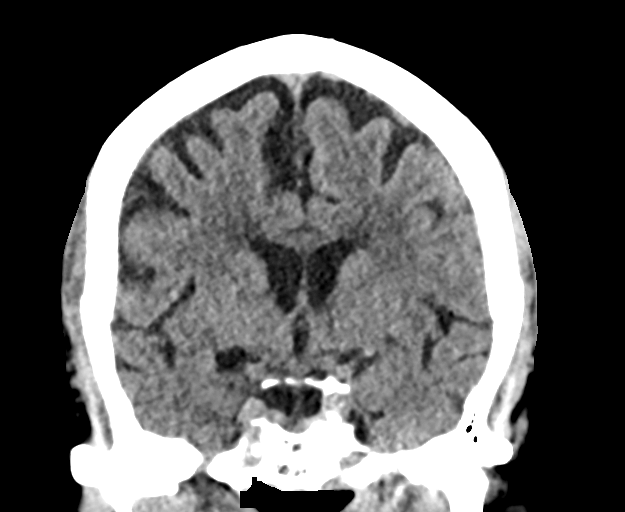

[Series 6: sag soft · sagittal · 0.30mm/px · 3 of 58 slices shown]
[im 23/58  brain]
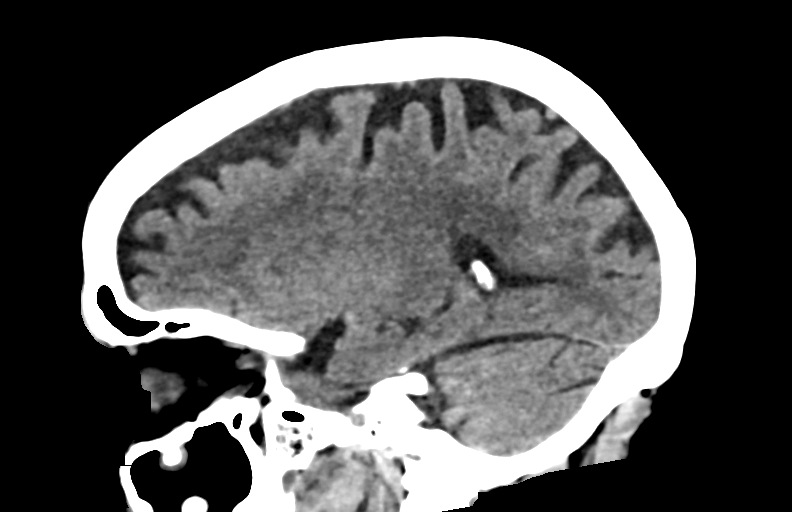
[im 29/58  brain]
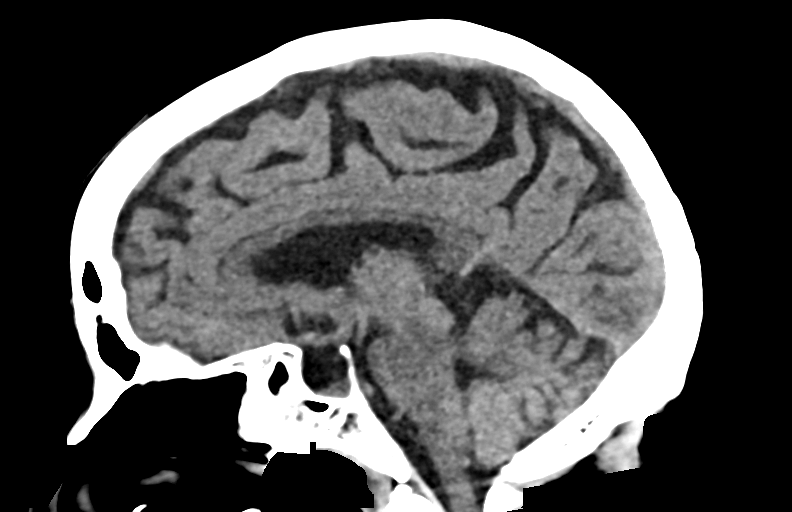
[im 35/58  brain]
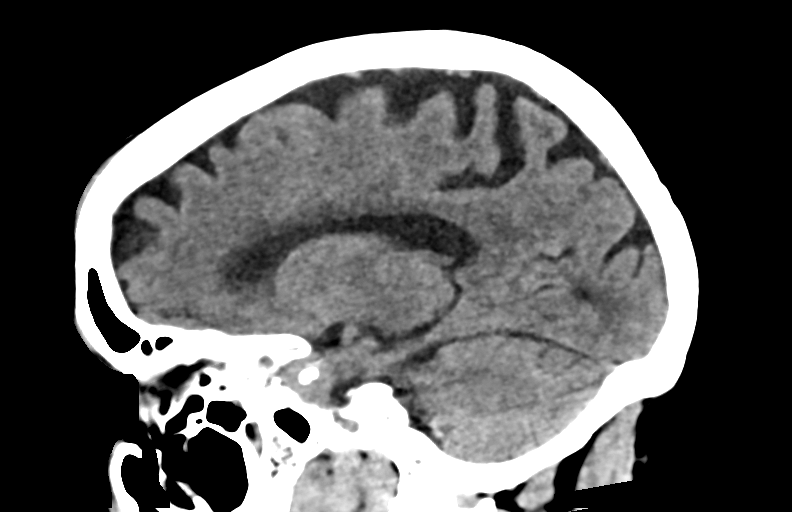

[15 of 47 positions shown; findings below may reference images not displayed]

FINDINGS: Brain: Stable chronic small-vessel ischemic changes throughout the
periventricular white matter. No acute infarct or hemorrhage.
Lateral ventricles and remaining midline structures are
unremarkable. No acute extra-axial fluid collections. No mass
effect.

Vascular: No hyperdense vessel or unexpected calcification.

Skull: Small left frontal scalp hematoma. No underlying fracture.
The remainder of the calvarium is unremarkable.

Sinuses/Orbits: Mild diffuse mucoperiosteal thickening throughout
the maxillary, ethmoid, and sphenoid sinuses. No gas fluid levels.

Other: None.
IMPRESSION: 1. No acute intracranial process.
2. Extensive chronic small-vessel ischemic changes throughout the
white matter.

## 2020-07-21 NOTE — ED Provider Notes (Signed)
[facility] EMERGENCY DEPARTMENT Provider Note   CSN: [id number] Arrival date & time: 07/20/20  2347     History Chief Complaint  Patient presents with  . Mallory Klein is a 75 y.o. female.  The history is provided by the patient.  Fall This is a new problem. The current episode started less than 1 hour ago. The problem occurs constantly. The problem has been resolved. Pertinent negatives include no chest pain, no abdominal pain, no headaches and no shortness of breath. Nothing aggravates the symptoms. Nothing relieves the symptoms. She has tried nothing for the symptoms. The treatment provided no relief.  Patient with parkinson;'s tripped and fell on carpet and now has a forehead hematoma.       Past Medical History:  Diagnosis Date  . Abnormal CBC 02/05/2015   [name] 12/16 new: normocytic anemia and thrombocytosis  . Adjustment disorder with mixed anxiety and depressed mood 02/15/2007   Chronic  Chronic pain Grief, stress Effexor XR  Dad died in February 20, 2023  . Asthma   . Diverticulosis of colon (without mention of hemorrhage)   . Esophageal stricture   . Essential hypertension 01/26/2007   Chronic Verapamil   . GERD (gastroesophageal reflux disease) 02/15/2007   Chronic    . Heart murmur   . Hyperglycemia 03/02/2011   Mild    . Insomnia disorder 12/08/2018   10/20 Carbid/Lev dose was increased - c/o hard time falling asleep (6 am) getting up at 12 am, poor sleep. Try Temazepam 15-30 mg at 11-1:30 pm   . Internal hemorrhoids without mention of complication   . Iron deficiency anemia 04/23/2017  . Mild neurocognitive disorder, likely due to Parkinson's disease 02/09/2019  . OSA (obstructive sleep apnea) 02/14/2010   In lab study (May 2018): AHI 25. Only had 45 minutes of sleep secondary to frequent awakenings secondary to sleep apnea. autocpap 5-15 cm water.   . Osteoarthritis   . Parkinson disease ([place]) 02/19/14   2015 2017  Primidone - d/c 2019  Parkinson's: Sinmet IR. Dr Tat  . Postherpetic trigeminal neuralgia 01/26/2007   Qualifier: Diagnosis of  By: Marca Ancona RMA, [name]    . Shortness of breath dyspnea    with allergies  . Thrombocytosis 05/24/2017  . Urinary incontinence 01/26/2007   Chronic  10/17 Detrol LA   . Vitamin D deficiency 01/26/2019    Patient Active Problem List   Diagnosis Date Noted  . Anosmia 05/02/2020  . Grief reaction 03/27/2020  . Memory loss 12/13/2019  . Food poisoning 09/11/2019  . B12 deficiency 06/14/2019  . Mild neurocognitive disorder, likely due to Parkinson's disease 02/09/2019  . Vitamin D deficiency 01/26/2019  . Sweating profusely 12/08/2018  . Insomnia disorder 12/08/2018  . Cerumen impaction 10/19/2018  . Lumbar facet arthropathy 02/10/2018  . Thrombocytosis 05/24/2017  . Iron deficiency anemia 04/23/2017  . Cough 05/12/2016  . Pseudophakia of both eyes 10/09/2015  . Abnormal CBC 02/05/2015  . Edema 10/15/2014  . S/P lumbar laminectomy 02/20/2014  . Pre-ulcerative calluses 02-19-2014  . Parkinson disease ([place]) 02-19-14  . Cold sore 10/17/2013  . Low back pain 07/25/2013  . Nuclear sclerosis 02/08/2013  . Heel ulcer ([place]) 03/08/2012  . Rash 12/04/2011  . Neck pain on left side 08/06/2011  . Presence of unspecified artificial knee joint 04/22/2011  . Hyperglycemia 03/02/2011  . Acquired spondylolisthesis 12/25/2010  . Degeneration of lumbosacral intervertebral disc 12/25/2010  . Arthritis of right knee 12/03/2010  . Knee  pain, right 09/08/2010  . OSA (obstructive sleep apnea) 02/14/2010  . BRONCHITIS, ACUTE 04/05/2009  . Vertigo 12/02/2007  . Other abnormal glucose 12/02/2007  . Otitis media 10/26/2007  . Other malaise and fatigue 05/11/2007  . Adjustment disorder with mixed anxiety and depressed mood 02/15/2007  . GERD (gastroesophageal reflux disease) 02/15/2007  . Osteoarthritis 02/15/2007  . Postherpetic trigeminal neuralgia 01/26/2007  . Herpes zoster 01/26/2007  .  Morbid obesity 01/26/2007  . Migraine headaches 01/26/2007  . Essential hypertension 01/26/2007  . Asthma 01/26/2007  . Urinary incontinence 01/26/2007    Past Surgical History:  Procedure Laterality Date  . ABDOMINAL HYSTERECTOMY    . BREAST CYST EXCISION Left 1980's  . LUMBAR LAMINECTOMY/DECOMPRESSION MICRODISCECTOMY Bilateral 02/20/2014   Procedure: LUMBAR TWO THREE, LUMBAR THREE FOUR LUMBAR LAMINECTOMY/DECOMPRESSION MICRODISCECTOMY 2 LEVELS;  Surgeon: Eustace Moore, MD;  Location: [facility];  Service: Neurosurgery;  Laterality: Bilateral;  . SPLENECTOMY    . TIBIA FRACTURE SURGERY Right   . TIBIA FRACTURE SURGERY Left   . TOTAL KNEE ARTHROPLASTY Right 2013     OB History   No obstetric history on file.     Family History  Problem Relation Age of Onset  . Hypertension Mother   . Diabetes type II Mother   . Asthma Mother   . Diabetes Mother   . Dementia Mother   . Prostate cancer Father   . Mental illness Father        dementia  . Suicidality Son   . Stroke Brother        died in prison  . Colon cancer Neg Hx   . Rectal cancer Neg Hx     Social History   Tobacco Use  . Smoking status: Never Smoker  . Smokeless tobacco: Never Used  Vaping Use  . Vaping Use: Never used  Substance Use Topics  . Alcohol use: No    Alcohol/week: 2.0 standard drinks    Types: 2 Glasses of wine per week    Comment: wine on occ   . Drug use: No    Home Medications Prior to Admission medications   Medication Sig Start Date End Date Taking? Authorizing Provider  albuterol (VENTOLIN HFA) 108 (90 Base) MCG/ACT inhaler Inhale 2 puffs into the lungs every 6 (six) hours as needed for wheezing or shortness of breath. 06/12/19   Plotnikov, Evie Lacks, MD  buprenorphine (BUTRANS) 7.5 MCG/HR Place 1 patch onto the skin once a week. 03/08/20   Plotnikov, Evie Lacks, MD  carbidopa-levodopa (SINEMET CR) 50-200 MG tablet Take 1 pill 3 times daily at 11 AM, 5 PM and 10 PM daily. 11/22/19   Star Age, MD  celecoxib (CELEBREX) 200 MG capsule Take 1 capsule (200 mg total) by mouth 2 (two) times daily as needed for moderate pain. 03/27/20   Plotnikov, Evie Lacks, MD  cetirizine (ZYRTEC) 10 MG tablet Take 1 tablet (10 mg total) by mouth daily. 04/15/20   Plotnikov, Evie Lacks, MD  [employer] D3 50 MCG (2000 UT) CAPS TAKE 1 CAPSULE BY MOUTH EVERY DAY 04/30/20   Plotnikov, Evie Lacks, MD  cyanocobalamin (,VITAMIN B-12,) 1000 MCG/ML injection Inject 1 mL (1,000 mcg total) into the skin every 14 (fourteen) days. 07/12/19   Plotnikov, Evie Lacks, MD  diclofenac Sodium (VOLTAREN) 1 % GEL Apply 4 g topically daily. 04/25/13   [provider]  docusate sodium (COLACE) 100 MG capsule SMARTSIG:1 Tablet(s) By Mouth Twice Daily 06/26/19   [provider]  donepezil (ARICEPT)  5 MG tablet Take 1 tablet (5 mg total) by mouth at bedtime. 11/27/19   Plotnikov, Evie Lacks, MD  fluocinonide ointment (LIDEX) 8.33 % Apply 1 application topically 2 (two) times daily. 08/14/19   Janith Lima, MD  hydrOXYzine (ATARAX/VISTARIL) 25 MG tablet Take 1 tablet (25 mg total) by mouth every 8 (eight) hours as needed for itching (itching from Butrans patch). Patient taking differently: Take 25 mg by mouth 2 (two) times daily as needed for itching (itching from Butrans patch). 06/12/19   Plotnikov, Evie Lacks, MD  hydrOXYzine (VISTARIL) 25 MG capsule Take 25 mg by mouth 2 (two) times daily. 05/27/20   [provider]  montelukast (SINGULAIR) 10 MG tablet Take 1 tablet (10 mg total) by mouth daily. 11/27/19   Plotnikov, Evie Lacks, MD  OVER THE COUNTER MEDICATION 1 tablet daily. Lions mane extract    [provider]  pantoprazole (PROTONIX) 40 MG tablet Take 1 tablet (40 mg total) by mouth daily. 03/27/20   Plotnikov, Evie Lacks, MD  polyethylene glycol (MIRALAX / GLYCOLAX) 17 g packet Take 17 g by mouth daily as needed.    [provider]  venlafaxine XR (EFFEXOR-XR) 150 MG 24 hr capsule TAKE 1 CAPSULE BY MOUTH   EVERY DAY WITH BREAKFAST 05/29/19   Plotnikov, Evie Lacks, MD  verapamil (VERELAN PM) 120 MG 24 hr capsule Take 1 capsule (120 mg total) by mouth at bedtime. 03/27/20   Plotnikov, Evie Lacks, MD  zolpidem (AMBIEN) 10 MG tablet Take 0.5-1 tablets (5-10 mg total) by mouth at bedtime as needed for sleep. 04/22/20 05/22/20  Plotnikov, Evie Lacks, MD    Allergies    Ace inhibitors, Benicar [olmesartan], Aspirin, and Citalopram hydrobromide  Review of Systems   Review of Systems  Constitutional: Negative for fever.  HENT: Negative for drooling.   Eyes: Negative for redness.  Respiratory: Negative for shortness of breath and wheezing.   Cardiovascular: Negative for chest pain and leg swelling.  Gastrointestinal: Negative for abdominal pain and vomiting.  Genitourinary: Negative for difficulty urinating.  Musculoskeletal: Negative for neck pain.  Skin: Negative for wound.  Neurological: Negative for headaches.  Psychiatric/Behavioral: Negative for agitation.  All other systems reviewed and are negative.   Physical Exam Updated Vital Signs BP 129/72   Pulse 74   Temp 99 F (37.2 C) (Oral)   Resp 16   Ht 5\' 1"  (1.549 m)   Wt 72.6 kg   SpO2 99%   BMI 30.23 kg/m   Physical Exam Vitals and nursing note reviewed.  Constitutional:      General: She is not in acute distress.    Appearance: Normal appearance.  HENT:     Head: Normocephalic.      Nose: Nose normal.     Mouth/Throat:     Mouth: Mucous membranes are moist.     Pharynx: Oropharynx is clear.  Eyes:     Conjunctiva/sclera: Conjunctivae normal.     Pupils: Pupils are equal, round, and reactive to light.  Cardiovascular:     Rate and Rhythm: Normal rate and regular rhythm.     Pulses: Normal pulses.     Heart sounds: Normal heart sounds.  Pulmonary:     Effort: Pulmonary effort is normal.     Breath sounds: Normal breath sounds.  Abdominal:     General: Abdomen is flat. Bowel sounds are normal.     Palpations: Abdomen is  soft.     Tenderness: There is no abdominal tenderness. There  is no guarding.  Musculoskeletal:        General: No tenderness or deformity. Normal range of motion.     Cervical back: Normal range of motion and neck supple.     Right lower leg: No edema.     Left lower leg: No edema.  Skin:    General: Skin is warm and dry.     Capillary Refill: Capillary refill takes less than 2 seconds.  Neurological:     General: No focal deficit present.     Mental Status: She is alert.     Deep Tendon Reflexes: Reflexes normal.  Psychiatric:        Mood and Affect: Mood normal.        Behavior: Behavior normal.     ED Results / Procedures / Treatments   Labs (all labs ordered are listed, but only abnormal results are displayed) Labs Reviewed - No data to display  EKG None  Radiology CT Head Wo Contrast  Result Date: 07/21/2020 CLINICAL DATA:  Parkinson's, fell, left frontal scalp swelling EXAM: CT HEAD WITHOUT CONTRAST TECHNIQUE: Contiguous axial images were obtained from the base of the skull through the vertex without intravenous contrast. COMPARISON:  05/27/2020 FINDINGS: Brain: Stable chronic small-vessel ischemic changes throughout the periventricular white matter. No acute infarct or hemorrhage. Lateral ventricles and remaining midline structures are unremarkable. No acute extra-axial fluid collections. No mass effect. Vascular: No hyperdense vessel or unexpected calcification. Skull: Small left frontal scalp hematoma. No underlying fracture. The remainder of the calvarium is unremarkable. Sinuses/Orbits: Mild diffuse mucoperiosteal thickening throughout the maxillary, ethmoid, and sphenoid sinuses. No gas fluid levels. Other: None. IMPRESSION: 1. No acute intracranial process. 2. Extensive chronic small-vessel ischemic changes throughout the white matter. Electronically Signed   By: Randa Ngo M.D.   On: 07/21/2020 01:05   CT Cervical Spine Wo Contrast  Result Date:  07/21/2020 CLINICAL DATA:  [place], left frontal scalp hematoma. EXAM: CT CERVICAL SPINE WITHOUT CONTRAST TECHNIQUE: Multidetector CT imaging of the cervical spine was performed without intravenous contrast. Multiplanar CT image reconstructions were also generated. COMPARISON:  None. FINDINGS: Alignment: Normal. Skull base and vertebrae: No acute fracture. No primary bone lesion or focal pathologic process. Soft tissues and spinal canal: No prevertebral fluid or swelling. No visible canal hematoma. Disc levels: Mild multilevel spondylosis with disc space narrowing and anterior osteophyte most pronounced at C4-5 and C5-6. Significant symmetrical neural foraminal encroachment at C4-5 as result of disc osteophyte complex and uncovertebral hypertrophy. Upper chest: Airway is patent.  Lung apices are clear. Other: Reconstructed images demonstrate no additional findings. IMPRESSION: 1. No acute cervical spine fracture. 2. Multilevel spondylosis, greatest at C4-5. Electronically Signed   By: Randa Ngo M.D.   On: 07/21/2020 01:08    Procedures Procedures   Medications Ordered in ED Medications - No data to display  ED Course  I have reviewed the triage vital signs and the nursing notes.  Pertinent labs & imaging results that were available during my care of the patient were reviewed by me and considered in my medical decision making (see chart for details).    No trauma from fall, stable for discharge with close follow up.    Mallory Klein was evaluated in Emergency Department on 07/21/2020 for the symptoms described in the history of present illness. She was evaluated in the context of the global COVID-19 pandemic, which necessitated consideration that the patient might be at risk for infection with the SARS-CoV-2 virus that causes COVID-19.  Institutional protocols and algorithms that pertain to the evaluation of patients at risk for COVID-19 are in a state of rapid change based on information  released by regulatory bodies including the CDC and federal and state organizations. These policies and algorithms were followed during the patient's care in the ED.  Final Clinical Impression(s) / ED Diagnoses Return for intractable cough, coughing up blood, fevers >100.4 unrelieved by medication, shortness of breath, intractable vomiting, chest pain, shortness of breath, weakness, numbness, changes in speech, facial asymmetry, abdominal pain, passing out, Inability to tolerate liquids or food, cough, altered mental status or any concerns. No signs of systemic illness or infection. The patient is nontoxic-appearing on exam and vital signs are within normal limits.  I have reviewed the triage vital signs and the nursing notes. Pertinent labs & imaging results that were available during my care of the patient were reviewed by me and considered in my medical decision making (see chart for details). After history, exam, and medical workup I feel the patient has been appropriately medically screened and is safe for discharge home. Pertinent diagnoses were discussed with the patient. Patient was given return precautions.    [place], Rakhi Romagnoli, MD 07/21/20 [phone]

## 2020-07-21 NOTE — ED Notes (Signed)
Pt given an ice pack for forehead.

## 2020-07-21 NOTE — ED Notes (Signed)
Abrasion and hematoma present to left side of forehead.

## 2020-07-23 ENCOUNTER — Encounter (HOSPITAL_COMMUNITY): Payer: Self-pay | Admitting: Emergency Medicine

## 2020-07-23 ENCOUNTER — Telehealth: Payer: Self-pay | Admitting: Internal Medicine

## 2020-07-23 ENCOUNTER — Ambulatory Visit (HOSPITAL_COMMUNITY)
Admission: EM | Admit: 2020-07-23 | Discharge: 2020-07-23 | Disposition: A | Discharge status: Home / Self Care | Payer: BC Managed Care – PPO | Attending: Student | Admitting: Student

## 2020-07-23 DIAGNOSIS — I10 Essential (primary) hypertension: Secondary | ICD-10-CM

## 2020-07-23 DIAGNOSIS — S0003XD Contusion of scalp, subsequent encounter: Secondary | ICD-10-CM

## 2020-07-23 DIAGNOSIS — S46811D Strain of other muscles, fascia and tendons at shoulder and upper arm level, right arm, subsequent encounter: Secondary | ICD-10-CM

## 2020-07-23 DIAGNOSIS — W19XXXD Unspecified fall, subsequent encounter: Secondary | ICD-10-CM

## 2020-07-23 DIAGNOSIS — G20 Parkinson's disease: Secondary | ICD-10-CM

## 2020-07-23 MED ORDER — TIZANIDINE HCL 2 MG PO CAPS: 2.0000 mg | ORAL_CAPSULE | Freq: Three times a day (TID) | ORAL | 0 refills | Status: DC

## 2020-07-23 NOTE — Telephone Encounter (Signed)
Patient called and said that she had a fall on 5/28. She said that she went to the ED and they did an CT scan  of her head and she said that they told her that they did not see anything but she said that she is having some left shoulder pain and down her back. She was wondering if there was anything that she could do to relieve some of the pain. She can be reached at [phone]. Please advise

## 2020-07-23 NOTE — ED Triage Notes (Signed)
Pt presents with right shoulder pain after fall on Saturday.

## 2020-07-23 NOTE — Discharge Instructions (Addendum)
-  Start the muscle relaxer-Zanaflex (tizanidine), up to 3 times daily for muscle spasms and pain.  This can make you drowsy, so take at bedtime or when you do not need to drive or operate machinery. -Tylenol for additional relief.  -Please check your blood pressure at home or at the pharmacy. If this continues to be >140/90, follow-up with your primary care provider for further blood pressure management/ medication titration. If you develop chest pain, shortness of breath, vision changes, the worst headache of your life- head straight to the ED or call 911. -Seek additional medical attention if you develop new symptoms like shortness of breath, dizziness, vision changes, worst headache of life, chest pain.

## 2020-07-23 NOTE — ED Provider Notes (Signed)
Mallory Klein    CSN: [id number] Arrival date & time: 07/23/20  1808      History   Chief Complaint Chief Complaint  Patient presents with  . Fall  . Shoulder Pain    Right    HPI [name] is a 75 y.o. female presenting following fall that occurred on 07/20/2020.  This patient has a history of Parkinson's disease and has a high fall risk.  States that she tripped on some carpet and fell, sustaining a left forehead hematoma.  She presented to the emergency department on the date of the fall, they performed a CT of the head and the neck which was fairly normal.  She was discharged without any medications to control her pain.  States she is presenting today with continued right trapezius pain.  She has tried Celebrex with no relief.  Denies new headaches, dizziness, vision changes, weakness, chest pain, shortness of breath, abdominal pain, change in bowel or bladder function, hip pain.  No new falls.  States she takes her antihypertensives in the evening, has not taken these yet today.  Denies dizziness, headaches, chest pain, shortness of breath.  HPI  Past Medical History:  Diagnosis Date  . Abnormal CBC 02/05/2015   [name] 12/16 new: normocytic anemia and thrombocytosis  . Adjustment disorder with mixed anxiety and depressed mood 02/15/2007   Chronic  Chronic pain Grief, stress Effexor XR  Dad died in February 15, 2023  . Asthma   . Diverticulosis of colon (without mention of hemorrhage)   . Esophageal stricture   . Essential hypertension 01/26/2007   Chronic Verapamil   . GERD (gastroesophageal reflux disease) 02/15/2007   Chronic    . Heart murmur   . Hyperglycemia 03/02/2011   Mild    . Insomnia disorder 12/08/2018   10/20 Carbid/Lev dose was increased - c/o hard time falling asleep (6 am) getting up at 12 am, poor sleep. Try Temazepam 15-30 mg at 11-1:30 pm   . Internal hemorrhoids without mention of complication   . Iron deficiency anemia 04/23/2017  . Mild  neurocognitive disorder, likely due to Parkinson's disease 02/09/2019  . OSA (obstructive sleep apnea) 02/14/2010   In lab study (May 2018): AHI 25. Only had 45 minutes of sleep secondary to frequent awakenings secondary to sleep apnea. autocpap 5-15 cm water.   . Osteoarthritis   . Parkinson disease ([place]) 2014-02-14   2015 2017  Primidone - d/c 2019 Parkinson's: Sinmet IR. Dr Tat  . Postherpetic trigeminal neuralgia 01/26/2007   Qualifier: Diagnosis of  By: Marca Ancona RMA, [name]    . Shortness of breath dyspnea    with allergies  . Thrombocytosis 05/24/2017  . Urinary incontinence 01/26/2007   Chronic  10/17 Detrol LA   . Vitamin D deficiency 01/26/2019    Patient Active Problem List   Diagnosis Date Noted  . Anosmia 05/02/2020  . Grief reaction 03/27/2020  . Memory loss 12/13/2019  . Food poisoning 09/11/2019  . B12 deficiency 06/14/2019  . Mild neurocognitive disorder, likely due to Parkinson's disease 02/09/2019  . Vitamin D deficiency 01/26/2019  . Sweating profusely 12/08/2018  . Insomnia disorder 12/08/2018  . Cerumen impaction 10/19/2018  . Lumbar facet arthropathy 02/10/2018  . Thrombocytosis 05/24/2017  . Iron deficiency anemia 04/23/2017  . Cough 05/12/2016  . Pseudophakia of both eyes 10/09/2015  . Abnormal CBC 02/05/2015  . Edema 10/15/2014  . S/P lumbar laminectomy 02/20/2014  . Pre-ulcerative calluses 2014-02-14  . Parkinson disease ([place]) 02/14/2014  .  Cold sore 10/17/2013  . Low back pain 07/25/2013  . Nuclear sclerosis 02/08/2013  . Heel ulcer ([place]) 03/08/2012  . Rash 12/04/2011  . Neck pain on left side 08/06/2011  . Presence of unspecified artificial knee joint 04/22/2011  . Hyperglycemia 03/02/2011  . Acquired spondylolisthesis 12/25/2010  . Degeneration of lumbosacral intervertebral disc 12/25/2010  . Arthritis of right knee 12/03/2010  . Knee pain, right 09/08/2010  . OSA (obstructive sleep apnea) 02/14/2010  . BRONCHITIS, ACUTE 04/05/2009  . Vertigo  12/02/2007  . Other abnormal glucose 12/02/2007  . Otitis media 10/26/2007  . Other malaise and fatigue 05/11/2007  . Adjustment disorder with mixed anxiety and depressed mood 02/15/2007  . GERD (gastroesophageal reflux disease) 02/15/2007  . Osteoarthritis 02/15/2007  . Postherpetic trigeminal neuralgia 01/26/2007  . Herpes zoster 01/26/2007  . Morbid obesity 01/26/2007  . Migraine headaches 01/26/2007  . Essential hypertension 01/26/2007  . Asthma 01/26/2007  . Urinary incontinence 01/26/2007    Past Surgical History:  Procedure Laterality Date  . ABDOMINAL HYSTERECTOMY    . BREAST CYST EXCISION Left 1980's  . LUMBAR LAMINECTOMY/DECOMPRESSION MICRODISCECTOMY Bilateral 02/20/2014   Procedure: LUMBAR TWO THREE, LUMBAR THREE FOUR LUMBAR LAMINECTOMY/DECOMPRESSION MICRODISCECTOMY 2 LEVELS;  Surgeon: Eustace Moore, MD;  Location: [facility];  Service: Neurosurgery;  Laterality: Bilateral;  . SPLENECTOMY    . TIBIA FRACTURE SURGERY Right   . TIBIA FRACTURE SURGERY Left   . TOTAL KNEE ARTHROPLASTY Right 2013    OB History   No obstetric history on file.      Home Medications    Prior to Admission medications   Medication Sig Start Date End Date Taking? Authorizing Provider  tizanidine (ZANAFLEX) 2 MG capsule Take 1 capsule (2 mg total) by mouth 3 (three) times daily. 07/23/20  Yes Hazel Sams, PA-C  albuterol (VENTOLIN HFA) 108 (90 Base) MCG/ACT inhaler Inhale 2 puffs into the lungs every 6 (six) hours as needed for wheezing or shortness of breath. 06/12/19   Plotnikov, Evie Lacks, MD  celecoxib (CELEBREX) 200 MG capsule Take 1 capsule (200 mg total) by mouth 2 (two) times daily as needed for moderate pain. 03/27/20   Plotnikov, Evie Lacks, MD  cetirizine (ZYRTEC) 10 MG tablet Take 1 tablet (10 mg total) by mouth daily. 04/15/20   Plotnikov, Evie Lacks, MD  cyanocobalamin (,VITAMIN B-12,) 1000 MCG/ML injection Inject 1 mL (1,000 mcg total) into the skin every 14 (fourteen) days.  07/12/19   Plotnikov, Evie Lacks, MD  diclofenac Sodium (VOLTAREN) 1 % GEL Apply 4 g topically daily as needed (pain). 04/25/13   [provider]  docusate sodium (COLACE) 100 MG capsule Take 100 mg by mouth at bedtime. 06/26/19   [provider]  hydroxypropyl methylcellulose / hypromellose (ISOPTO TEARS / GONIOVISC) 2.5 % ophthalmic solution Place 1 drop into both eyes 3 (three) times daily as needed for dry eyes.    [provider]  hydrOXYzine (ATARAX/VISTARIL) 25 MG tablet Take 1 tablet (25 mg total) by mouth every 8 (eight) hours as needed for itching (itching from [place]rans patch). Patient taking differently: Take 25 mg by mouth 2 (two) times daily as needed for itching (itching from Butrans patch). 06/12/19   Plotnikov, Evie Lacks, MD  montelukast (SINGULAIR) 10 MG tablet Take 1 tablet (10 mg total) by mouth daily. 11/27/19   Plotnikov, Evie Lacks, MD  OVER THE COUNTER MEDICATION Take 1 tablet by mouth at bedtime. Lions mane extract    [provider]  pantoprazole ([place])  40 MG tablet Take 1 tablet (40 mg total) by mouth daily. 03/27/20   Plotnikov, Evie Lacks, MD  polyethylene glycol (MIRALAX / GLYCOLAX) 17 g packet Take 17 g by mouth daily as needed for mild constipation.    [provider]  verapamil (VERELAN PM) 120 MG 24 hr capsule Take 1 capsule (120 mg total) by mouth at bedtime. 03/27/20   Plotnikov, Evie Lacks, MD  carbidopa-levodopa (SINEMET CR) 50-200 MG tablet Take 1 pill 3 times daily at 11 AM, 5 PM and 10 PM daily. Patient taking differently: Take 1 tablet by mouth See admin instructions. Take 1 pill 3 times daily at 11 AM, 5 PM and 10 PM daily. 11/22/19 07/23/20  Star Age, MD  donepezil (ARICEPT) 5 MG tablet Take 1 tablet (5 mg total) by mouth at bedtime. Patient not taking: Reported on 07/21/2020 11/27/19 07/23/20  Plotnikov, Evie Lacks, MD  venlafaxine XR (EFFEXOR-XR) 150 MG 24 hr capsule TAKE 1 CAPSULE BY MOUTH  EVERY DAY WITH BREAKFAST Patient  not taking: Reported on 07/21/2020 05/29/19 07/23/20  Plotnikov, Evie Lacks, MD  zolpidem (AMBIEN) 10 MG tablet Take 0.5-1 tablets (5-10 mg total) by mouth at bedtime as needed for sleep. Patient taking differently: Take 10 mg by mouth at bedtime. 04/22/20 07/23/20  Plotnikov, Evie Lacks, MD    Family History Family History  Problem Relation Age of Onset  . Hypertension Mother   . Diabetes type II Mother   . Asthma Mother   . Diabetes Mother   . Dementia Mother   . Prostate cancer Father   . Mental illness Father        dementia  . Suicidality Son   . Stroke Brother        died in prison  . Colon cancer Neg Hx   . Rectal cancer Neg Hx     Social History Social History   Tobacco Use  . Smoking status: Never Smoker  . Smokeless tobacco: Never Used  Vaping Use  . Vaping Use: Never used  Substance Use Topics  . Alcohol use: No    Alcohol/week: 2.0 standard drinks    Types: 2 Glasses of wine per week    Comment: wine on occ   . Drug use: No     Allergies   Ace inhibitors, Benicar [olmesartan], Aspirin, and Citalopram hydrobromide   Review of Systems Review of Systems  Constitutional: Negative for appetite change, chills and fever.  HENT: Negative for congestion, ear pain, rhinorrhea, sinus pressure, sinus pain and sore throat.   Eyes: Negative for redness and visual disturbance.  Respiratory: Negative for cough, chest tightness, shortness of breath and wheezing.   Cardiovascular: Negative for chest pain and palpitations.  Gastrointestinal: Negative for abdominal pain, constipation, diarrhea, nausea and vomiting.  Genitourinary: Negative for dysuria, frequency and urgency.  Musculoskeletal: Positive for back pain. Negative for myalgias.  Neurological: Negative for dizziness, weakness and headaches.  Psychiatric/Behavioral: Negative for confusion.  All other systems reviewed and are negative.    Physical Exam Triage Vital Signs ED Triage Vitals  Enc Vitals Group      BP 07/23/20 1903 (!) 190/110     Pulse Rate 07/23/20 1903 94     Resp 07/23/20 1903 16     Temp 07/23/20 1903 98.5 F (36.9 C)     Temp Source 07/23/20 1903 Oral     SpO2 --      Weight --      Height --  Head Circumference --      Peak Flow --      Pain Score 07/23/20 1901 8     Pain Loc --      Pain Edu? --      Excl. in [place]? --    No data found.  Updated Vital Signs BP (!) 190/110 (BP Location: Left Arm)   Pulse 94   Temp 98.5 F (36.9 C) (Oral)   Resp 16   Visual Acuity Right Eye Distance:   Left Eye Distance:   Bilateral Distance:    Right Eye Near:   Left Eye Near:    Bilateral Near:     Physical Exam Vitals reviewed.  Constitutional:      General: She is not in acute distress.    Appearance: Normal appearance. She is not ill-appearing.  HENT:     Head: Normocephalic and atraumatic.      Comments: L forehead with small 2cm abrasion, healing well. No surrounding erythema or discharge. Eyes:     Extraocular Movements: Extraocular movements intact.     Pupils: Pupils are equal, round, and reactive to light.  Cardiovascular:     Rate and Rhythm: Normal rate and regular rhythm.     Heart sounds: Normal heart sounds.  Pulmonary:     Effort: Pulmonary effort is normal.     Breath sounds: Normal breath sounds and air entry.  Abdominal:     Tenderness: There is no abdominal tenderness. There is no right CVA tenderness, left CVA tenderness, guarding or rebound.     Comments: No pain  Musculoskeletal:     Cervical back: Normal range of motion. No swelling, deformity, signs of trauma, rigidity, spasms, tenderness, bony tenderness or crepitus. No pain with movement.     Thoracic back: No swelling, deformity, signs of trauma, spasms, tenderness or bony tenderness. Normal range of motion. No scoliosis.     Lumbar back: No swelling, deformity, signs of trauma, spasms, tenderness or bony tenderness. Normal range of motion. Negative right straight leg raise test and  negative left straight leg raise test. No scoliosis.     Comments: Right proximal trapezius diffusely tender to palpation.  Range of motion shoulder and elbow intact and without pain.  Pain elicited with bringing chin to chest.  No spinous tenderness, deformity, step-off.  No hip or pelvic tenderness or instability.  Absolutely no other injury, deformity, tenderness, ecchymosis, abrasion.  Neurological:     General: No focal deficit present.     Mental Status: She is alert.     Cranial Nerves: No cranial nerve deficit.  Psychiatric:        Mood and Affect: Mood normal.        Behavior: Behavior normal.        Thought Content: Thought content normal.        Judgment: Judgment normal.      UC Treatments / Results  Labs (all labs ordered are listed, but only abnormal results are displayed) Labs Reviewed - No data to display  EKG   Radiology No results found.  Procedures Procedures (including critical care time)  Medications Ordered in UC Medications - No data to display  Initial Impression / Assessment and Plan / UC Course  I have reviewed the triage vital signs and the nursing notes.  Pertinent labs & imaging results that were available during my care of the patient were reviewed by me and considered in my medical decision making (see chart for details).  This patient is a 75 year old female presenting with right trapezius pain following fall that occurred 3 days ago.  She was previously evaluated in the emergency department for her symptoms, CT head and cervical spine were performed and were normal.  Today with continued right trapezius pain that is poorly controlled on Celebrex.  No new falls or trauma.  This patient is a high fall risk due to her Parkinson's disease.  Scalp contusion appears to be healing well.  Trial of Zanaflex for pain, Tylenol for additional relief.  Avoid ibuprofen.  For elevated blood pressure, she has not yet taken her antihypertensives yet  today.  Denies shortness of breath, chest pain, dizziness, headaches.  Monitor blood pressure at home.  Continue current regimen.  ED return precautions discussed.  Final Clinical Impressions(s) / UC Diagnoses   Final diagnoses:  Trapezius strain, right, subsequent encounter  Fall, subsequent encounter  Parkinson disease ([place])  Contusion of scalp, subsequent encounter  Essential hypertension     Discharge Instructions     -Start the muscle relaxer-Zanaflex (tizanidine), up to 3 times daily for muscle spasms and pain.  This can make you drowsy, so take at bedtime or when you do not need to drive or operate machinery. -Tylenol for additional relief.  -Please check your blood pressure at home or at the pharmacy. If this continues to be >140/90, follow-up with your primary care provider for further blood pressure management/ medication titration. If you develop chest pain, shortness of breath, vision changes, the worst headache of your life- head straight to the ED or call 911. -Seek additional medical attention if you develop new symptoms like shortness of breath, dizziness, vision changes, worst headache of life, chest pain.     ED Prescriptions    Medication Sig Dispense Auth. Provider   tizanidine (ZANAFLEX) 2 MG capsule Take 1 capsule (2 mg total) by mouth 3 (three) times daily. 14 capsule Hazel Sams, PA-C     PDMP not reviewed this encounter.   Hazel Sams, PA-C 07/23/20 1943

## 2020-07-24 ENCOUNTER — Encounter: Payer: Self-pay | Admitting: Hematology

## 2020-07-24 NOTE — Telephone Encounter (Signed)
I am sorry about the fall.  Use Tylenol as needed use ice, warm compress.  Feel better.  Thanks

## 2020-07-24 NOTE — Telephone Encounter (Signed)
Notified pt w/MD response. Pt states she ended up going to urgent care on yesterday bcz the pain was unbearable. They rx Tizanidine.. she states she only had 2 doses pain ease up a little. She made f/u appt for 07/31/20.Marland KitchenChryl Heck

## 2020-07-25 ENCOUNTER — Emergency Department (HOSPITAL_COMMUNITY)
Admission: EM | Admit: 2020-07-25 | Discharge: 2020-07-25 | Disposition: A | Discharge status: Home / Self Care | Payer: Medicare Other | Attending: Emergency Medicine | Admitting: Emergency Medicine

## 2020-07-25 ENCOUNTER — Encounter (HOSPITAL_COMMUNITY): Payer: Self-pay | Admitting: Emergency Medicine

## 2020-07-25 ENCOUNTER — Emergency Department (HOSPITAL_COMMUNITY): Payer: Medicare Other

## 2020-07-25 ENCOUNTER — Other Ambulatory Visit: Payer: Self-pay

## 2020-07-25 DIAGNOSIS — Z79899 Other long term (current) drug therapy: Secondary | ICD-10-CM | POA: Diagnosis not present

## 2020-07-25 DIAGNOSIS — M62838 Other muscle spasm: Secondary | ICD-10-CM | POA: Insufficient documentation

## 2020-07-25 DIAGNOSIS — M546 Pain in thoracic spine: Secondary | ICD-10-CM | POA: Diagnosis not present

## 2020-07-25 DIAGNOSIS — Z043 Encounter for examination and observation following other accident: Secondary | ICD-10-CM | POA: Diagnosis not present

## 2020-07-25 DIAGNOSIS — I10 Essential (primary) hypertension: Secondary | ICD-10-CM | POA: Insufficient documentation

## 2020-07-25 DIAGNOSIS — J45909 Unspecified asthma, uncomplicated: Secondary | ICD-10-CM | POA: Diagnosis not present

## 2020-07-25 DIAGNOSIS — Z96651 Presence of right artificial knee joint: Secondary | ICD-10-CM | POA: Insufficient documentation

## 2020-07-25 DIAGNOSIS — G20 Parkinson's disease: Secondary | ICD-10-CM | POA: Diagnosis not present

## 2020-07-25 DIAGNOSIS — M542 Cervicalgia: Secondary | ICD-10-CM | POA: Diagnosis not present

## 2020-07-25 DIAGNOSIS — M47814 Spondylosis without myelopathy or radiculopathy, thoracic region: Secondary | ICD-10-CM | POA: Diagnosis not present

## 2020-07-25 MED ORDER — MORPHINE SULFATE (PF) 4 MG/ML IV SOLN
4.0000 mg | Freq: Once | INTRAVENOUS | Status: AC
Start: 2020-07-25 — End: 2020-07-25
  Administered 2020-07-25: 4 mg via INTRAVENOUS
  Filled 2020-07-25: qty 1

## 2020-07-25 MED ORDER — ONDANSETRON HCL 4 MG/2ML IJ SOLN
4.0000 mg | Freq: Once | INTRAMUSCULAR | Status: AC
  Administered 2020-07-25: 4 mg via INTRAVENOUS
  Filled 2020-07-25: qty 2

## 2020-07-25 MED ORDER — DIAZEPAM 5 MG PO TABS: 5.0000 mg | ORAL_TABLET | Freq: Two times a day (BID) | ORAL | 0 refills | Status: DC

## 2020-07-25 NOTE — ED Provider Notes (Signed)
[facility] EMERGENCY DEPARTMENT Provider Note   CSN: [id number] Arrival date & time: 07/25/20  1412     History Chief Complaint  Patient presents with  . Neck Pain    Right side     Mallory Klein is a 75 y.o. female.  Pt presents to the ED today with right sided upper back pain.  Pt fell a week ago and was seen in the ED.  She had a CT of her head/c-spine.  She was given a rx for Zanaflex, but it is not helping.  Pt denies any numbness or tingling in her hand.  No new injuries.        Past Medical History:  Diagnosis Date  . Abnormal CBC 02/05/2015   [name] 12/16 new: normocytic anemia and thrombocytosis  . Adjustment disorder with mixed anxiety and depressed mood 02/15/2007   Chronic  Chronic pain Grief, stress Effexor XR  Dad died in 02-22-2023  . Asthma   . Diverticulosis of colon (without mention of hemorrhage)   . Esophageal stricture   . Essential hypertension 01/26/2007   Chronic Verapamil   . GERD (gastroesophageal reflux disease) 02/15/2007   Chronic    . Heart murmur   . Hyperglycemia 03/02/2011   Mild    . Insomnia disorder 12/08/2018   10/20 Carbid/Lev dose was increased - c/o hard time falling asleep (6 am) getting up at 12 am, poor sleep. Try Temazepam 15-30 mg at 11-1:30 pm   . Internal hemorrhoids without mention of complication   . Iron deficiency anemia 04/23/2017  . Mild neurocognitive disorder, likely due to Parkinson's disease 02/09/2019  . OSA (obstructive sleep apnea) 02/14/2010   In lab study (May 2018): AHI 25. Only had 45 minutes of sleep secondary to frequent awakenings secondary to sleep apnea. autocpap 5-15 cm water.   . Osteoarthritis   . Parkinson disease ([place]) 2014-02-21   2015 2017  Primidone - d/c 2019 Parkinson's: [facility] IR. Dr Tat  . Postherpetic trigeminal neuralgia 01/26/2007   Qualifier: Diagnosis of  By: Marca Ancona RMA, [name]    . Shortness of breath dyspnea    with allergies  . Thrombocytosis 05/24/2017  . Urinary  incontinence 01/26/2007   Chronic  10/17 Detrol LA   . Vitamin D deficiency 01/26/2019    Patient Active Problem List   Diagnosis Date Noted  . Anosmia 05/02/2020  . Grief reaction 03/27/2020  . Memory loss 12/13/2019  . Food poisoning 09/11/2019  . B12 deficiency 06/14/2019  . Mild neurocognitive disorder, likely due to Parkinson's disease 02/09/2019  . Vitamin D deficiency 01/26/2019  . Sweating profusely 12/08/2018  . Insomnia disorder 12/08/2018  . Cerumen impaction 10/19/2018  . Lumbar facet arthropathy 02/10/2018  . Thrombocytosis 05/24/2017  . Iron deficiency anemia 04/23/2017  . Cough 05/12/2016  . Pseudophakia of both eyes 10/09/2015  . Abnormal CBC 02/05/2015  . Edema 10/15/2014  . S/P lumbar laminectomy 02/20/2014  . Pre-ulcerative calluses 02/21/14  . Parkinson disease ([place]) Feb 21, 2014  . Cold sore 10/17/2013  . Low back pain 07/25/2013  . Nuclear sclerosis 02/08/2013  . Heel ulcer ([place]) 03/08/2012  . Rash 12/04/2011  . Neck pain on left side 08/06/2011  . Presence of unspecified artificial knee joint 04/22/2011  . Hyperglycemia 03/02/2011  . Acquired spondylolisthesis 12/25/2010  . Degeneration of lumbosacral intervertebral disc 12/25/2010  . Arthritis of right knee 12/03/2010  . Knee pain, right 09/08/2010  . OSA (obstructive sleep apnea) 02/14/2010  . BRONCHITIS, ACUTE 04/05/2009  .  Vertigo 12/02/2007  . Other abnormal glucose 12/02/2007  . Otitis media 10/26/2007  . Other malaise and fatigue 05/11/2007  . Adjustment disorder with mixed anxiety and depressed mood 02/15/2007  . GERD (gastroesophageal reflux disease) 02/15/2007  . Osteoarthritis 02/15/2007  . Postherpetic trigeminal neuralgia 01/26/2007  . Herpes zoster 01/26/2007  . Morbid obesity 01/26/2007  . Migraine headaches 01/26/2007  . Essential hypertension 01/26/2007  . Asthma 01/26/2007  . Urinary incontinence 01/26/2007    Past Surgical History:  Procedure Laterality Date  .  ABDOMINAL HYSTERECTOMY    . BREAST CYST EXCISION Left 1980's  . LUMBAR LAMINECTOMY/DECOMPRESSION MICRODISCECTOMY Bilateral 02/20/2014   Procedure: LUMBAR TWO THREE, LUMBAR THREE FOUR LUMBAR LAMINECTOMY/DECOMPRESSION MICRODISCECTOMY 2 LEVELS;  Surgeon: Eustace Moore, MD;  Location: [facility];  Service: Neurosurgery;  Laterality: Bilateral;  . SPLENECTOMY    . TIBIA FRACTURE SURGERY Right   . TIBIA FRACTURE SURGERY Left   . TOTAL KNEE ARTHROPLASTY Right 2013     OB History   No obstetric history on file.     Family History  Problem Relation Age of Onset  . Hypertension Mother   . Diabetes type II Mother   . Asthma Mother   . Diabetes Mother   . Dementia Mother   . Prostate cancer Father   . Mental illness Father        dementia  . Suicidality Son   . Stroke Brother        died in prison  . Colon cancer Neg Hx   . Rectal cancer Neg Hx     Social History   Tobacco Use  . Smoking status: Never Smoker  . Smokeless tobacco: Never Used  Vaping Use  . Vaping Use: Never used  Substance Use Topics  . Alcohol use: No    Alcohol/week: 2.0 standard drinks    Types: 2 Glasses of wine per week    Comment: wine on occ   . Drug use: No    Home Medications Prior to Admission medications   Medication Sig Start Date End Date Taking? Authorizing Provider  diazepam (VALIUM) 5 MG tablet Take 1 tablet (5 mg total) by mouth 2 (two) times daily. 07/25/20  Yes Isla Pence, MD  albuterol (VENTOLIN HFA) 108 (90 Base) MCG/ACT inhaler Inhale 2 puffs into the lungs every 6 (six) hours as needed for wheezing or shortness of breath. 06/12/19   Plotnikov, Evie Lacks, MD  celecoxib (CELEBREX) 200 MG capsule Take 1 capsule (200 mg total) by mouth 2 (two) times daily as needed for moderate pain. 03/27/20   Plotnikov, Evie Lacks, MD  cetirizine (ZYRTEC) 10 MG tablet Take 1 tablet (10 mg total) by mouth daily. 04/15/20   Plotnikov, Evie Lacks, MD  cyanocobalamin (,VITAMIN B-12,) 1000 MCG/ML injection  Inject 1 mL (1,000 mcg total) into the skin every 14 (fourteen) days. 07/12/19   Plotnikov, Evie Lacks, MD  diclofenac Sodium (VOLTAREN) 1 % GEL Apply 4 g topically daily as needed (pain). 04/25/13   [provider]  docusate sodium (COLACE) 100 MG capsule Take 100 mg by mouth at bedtime. 06/26/19   [provider]  hydroxypropyl methylcellulose / hypromellose (ISOPTO TEARS / GONIOVISC) 2.5 % ophthalmic solution Place 1 drop into both eyes 3 (three) times daily as needed for dry eyes.    [provider]  hydrOXYzine (ATARAX/VISTARIL) 25 MG tablet Take 1 tablet (25 mg total) by mouth every 8 (eight) hours as needed for itching (itching from Butrans patch). Patient taking differently:  Take 25 mg by mouth 2 (two) times daily as needed for itching (itching from Butrans patch). 06/12/19   Plotnikov, Evie Lacks, MD  montelukast (SINGULAIR) 10 MG tablet Take 1 tablet (10 mg total) by mouth daily. 11/27/19   Plotnikov, Evie Lacks, MD  OVER THE COUNTER MEDICATION Take 1 tablet by mouth at bedtime. Lions mane extract    [provider]  pantoprazole (PROTONIX) 40 MG tablet Take 1 tablet (40 mg total) by mouth daily. 03/27/20   Plotnikov, Evie Lacks, MD  polyethylene glycol (MIRALAX / GLYCOLAX) 17 g packet Take 17 g by mouth daily as needed for mild constipation.    [provider]  tizanidine (ZANAFLEX) 2 MG capsule Take 1 capsule (2 mg total) by mouth 3 (three) times daily. 07/23/20   Hazel Sams, PA-C  verapamil (VERELAN PM) 120 MG 24 hr capsule Take 1 capsule (120 mg total) by mouth at bedtime. 03/27/20   Plotnikov, Evie Lacks, MD  carbidopa-levodopa (SINEMET CR) 50-200 MG tablet Take 1 pill 3 times daily at 11 AM, 5 PM and 10 PM daily. Patient taking differently: Take 1 tablet by mouth See admin instructions. Take 1 pill 3 times daily at 11 AM, 5 PM and 10 PM daily. 11/22/19 07/23/20  Star Age, MD  donepezil (ARICEPT) 5 MG tablet Take 1 tablet (5 mg total) by mouth at  bedtime. Patient not taking: Reported on 07/21/2020 11/27/19 07/23/20  Plotnikov, Evie Lacks, MD  venlafaxine XR (EFFEXOR-XR) 150 MG 24 hr capsule TAKE 1 CAPSULE BY MOUTH  EVERY DAY WITH BREAKFAST Patient not taking: Reported on 07/21/2020 05/29/19 07/23/20  Plotnikov, Evie Lacks, MD  zolpidem (AMBIEN) 10 MG tablet Take 0.5-1 tablets (5-10 mg total) by mouth at bedtime as needed for sleep. Patient taking differently: Take 10 mg by mouth at bedtime. 04/22/20 07/23/20  Plotnikov, Evie Lacks, MD    Allergies    Ace inhibitors, Benicar [olmesartan], Aspirin, and Citalopram hydrobromide  Review of Systems   Review of Systems  Musculoskeletal: Positive for back pain and neck pain.  All other systems reviewed and are negative.   Physical Exam Updated Vital Signs BP (!) 150/99   Pulse 86   Temp 99.2 F (37.3 C)   Resp 18   SpO2 99%   Physical Exam Vitals and nursing note reviewed.  Constitutional:      Appearance: Normal appearance.  HENT:     Head: Normocephalic and atraumatic.     Right Ear: External ear normal.     Left Ear: External ear normal.     Nose: Nose normal.     Mouth/Throat:     Mouth: Mucous membranes are moist.     Pharynx: Oropharynx is clear.  Eyes:     Extraocular Movements: Extraocular movements intact.     Conjunctiva/sclera: Conjunctivae normal.     Pupils: Pupils are equal, round, and reactive to light.  Neck:   Cardiovascular:     Rate and Rhythm: Normal rate and regular rhythm.     Pulses: Normal pulses.     Heart sounds: Normal heart sounds.  Pulmonary:     Effort: Pulmonary effort is normal.     Breath sounds: Normal breath sounds.  Abdominal:     General: Abdomen is flat. Bowel sounds are normal.     Palpations: Abdomen is soft.  Musculoskeletal:        General: Normal range of motion.     Cervical back: Normal range of motion and neck supple.  Skin:  General: Skin is warm.     Capillary Refill: Capillary refill takes less than 2 seconds.   Neurological:     General: No focal deficit present.     Mental Status: She is alert and oriented to person, place, and time.  Psychiatric:        Mood and Affect: Mood normal.        Behavior: Behavior normal.        Thought Content: Thought content normal.        Judgment: Judgment normal.     ED Results / Procedures / Treatments   Labs (all labs ordered are listed, but only abnormal results are displayed) Labs Reviewed - No data to display  EKG None  Radiology DG Thoracic Spine 2 View  Result Date: 07/25/2020 CLINICAL DATA:  Fall EXAM: THORACIC SPINE 2 VIEWS COMPARISON:  01/19/2010 FINDINGS: Diffuse degenerative changes with disc space narrowing and spurring. Normal alignment. No fracture or focal bone lesion. IMPRESSION: Degenerative changes.  No acute bony abnormality. Electronically Signed   By: Rolm Baptise M.D.   On: 07/25/2020 17:40   DG Scapula Right  Result Date: 07/25/2020 CLINICAL DATA:  Fall EXAM: RIGHT SCAPULA - 2+ VIEWS COMPARISON:  None. FINDINGS: There is no evidence of fracture or other focal bone lesions. Soft tissues are unremarkable. IMPRESSION: Negative. Electronically Signed   By: Rolm Baptise M.D.   On: 07/25/2020 17:40    Procedures Procedures   Medications Ordered in ED Medications  morphine 4 MG/ML injection 4 mg (4 mg Intravenous Given 07/25/20 1747)  ondansetron (ZOFRAN) injection 4 mg (4 mg Intravenous Given 07/25/20 1745)    ED Course  I have reviewed the triage vital signs and the nursing notes.  Pertinent labs & imaging results that were available during my care of the patient were reviewed by me and considered in my medical decision making (see chart for details).    MDM Rules/Calculators/A&P                          X-rays neg.  Pt is feeling better after meds.  She is stable for d/c.  Return if worse.  F/u with pcp.  Final Clinical Impression(s) / ED Diagnoses Final diagnoses:  Trapezius muscle spasm    Rx / DC Orders ED  Discharge Orders         Ordered    diazepam (VALIUM) 5 MG tablet  2 times daily        07/25/20 1810           Isla Pence, MD 07/25/20 [phone]

## 2020-07-25 NOTE — ED Provider Notes (Signed)
Emergency Medicine Provider Triage Evaluation Note  Mallory Klein , a 75 y.o. female  was evaluated in triage.  Pt complains of continued right-sided neck pain for the past few days.  Had a fall on 07/20/2020 and was evaluated for this.  She has been taking tizanidine but continues to have pain denies any numbness or additional injuries.  Review of Systems  Positive: Right-sided neck pain Negative: Numbness  Physical Exam  BP (!) 150/99   Pulse 86   Temp 99.2 F (37.3 C)   Resp 18   SpO2 99%  Gen:   Awake, no distress Resp:  Normal effort MSK:   Moves extremities without difficulty Other:  Tenderness palpation of the right paraspinal musculature of the cervical spine.  Normal sensation of bilateral upper extremities  Medical Decision Making  Medically screening exam initiated at 2:28 PM.  Appropriate orders placed.  Mallory Klein was informed that the remainder of the evaluation will be completed by another provider, this initial triage assessment does not replace that evaluation, and the importance of remaining in the ED until their evaluation is complete.  Likely pain control   Delia Heady, PA-C 07/25/20 1429    Pattricia Boss, MD 07/29/20 1432

## 2020-07-25 NOTE — ED Triage Notes (Signed)
Pt presents with c/o right sided neck pain. She fell over a week ago and prescribed muscle relaxer but has not had relief. Hx of parkinson's.

## 2020-07-26 ENCOUNTER — Ambulatory Visit: Payer: BC Managed Care – PPO | Admitting: Internal Medicine

## 2020-07-27 ENCOUNTER — Emergency Department (HOSPITAL_BASED_OUTPATIENT_CLINIC_OR_DEPARTMENT_OTHER): Payer: Medicare Other | Admitting: Radiology

## 2020-07-27 ENCOUNTER — Emergency Department (HOSPITAL_BASED_OUTPATIENT_CLINIC_OR_DEPARTMENT_OTHER)
Admission: EM | Admit: 2020-07-27 | Discharge: 2020-07-27 | Disposition: A | Discharge status: Home / Self Care | Payer: Medicare Other | Attending: Emergency Medicine | Admitting: Emergency Medicine

## 2020-07-27 ENCOUNTER — Encounter (HOSPITAL_BASED_OUTPATIENT_CLINIC_OR_DEPARTMENT_OTHER): Payer: Self-pay | Admitting: Emergency Medicine

## 2020-07-27 ENCOUNTER — Other Ambulatory Visit: Payer: Self-pay

## 2020-07-27 DIAGNOSIS — J45909 Unspecified asthma, uncomplicated: Secondary | ICD-10-CM | POA: Insufficient documentation

## 2020-07-27 DIAGNOSIS — M542 Cervicalgia: Secondary | ICD-10-CM | POA: Insufficient documentation

## 2020-07-27 DIAGNOSIS — G20 Parkinson's disease: Secondary | ICD-10-CM | POA: Insufficient documentation

## 2020-07-27 DIAGNOSIS — I10 Essential (primary) hypertension: Secondary | ICD-10-CM | POA: Insufficient documentation

## 2020-07-27 DIAGNOSIS — Z96651 Presence of right artificial knee joint: Secondary | ICD-10-CM | POA: Diagnosis not present

## 2020-07-27 DIAGNOSIS — M549 Dorsalgia, unspecified: Secondary | ICD-10-CM | POA: Insufficient documentation

## 2020-07-27 DIAGNOSIS — Z79899 Other long term (current) drug therapy: Secondary | ICD-10-CM | POA: Insufficient documentation

## 2020-07-27 DIAGNOSIS — W19XXXA Unspecified fall, initial encounter: Secondary | ICD-10-CM | POA: Insufficient documentation

## 2020-07-27 DIAGNOSIS — M25511 Pain in right shoulder: Secondary | ICD-10-CM | POA: Insufficient documentation

## 2020-07-27 IMAGING — DX DG SHOULDER 2+V*R*
1 series · 3 of 3 positions shown · non-contrast
Comparison: None.

CLINICAL DATA: Pain

EXAM:
RIGHT SHOULDER - 2+ VIEW

[Series 1: shoulder · 0.14mm/px · 3 of 3 slices shown]
[im 1/3]
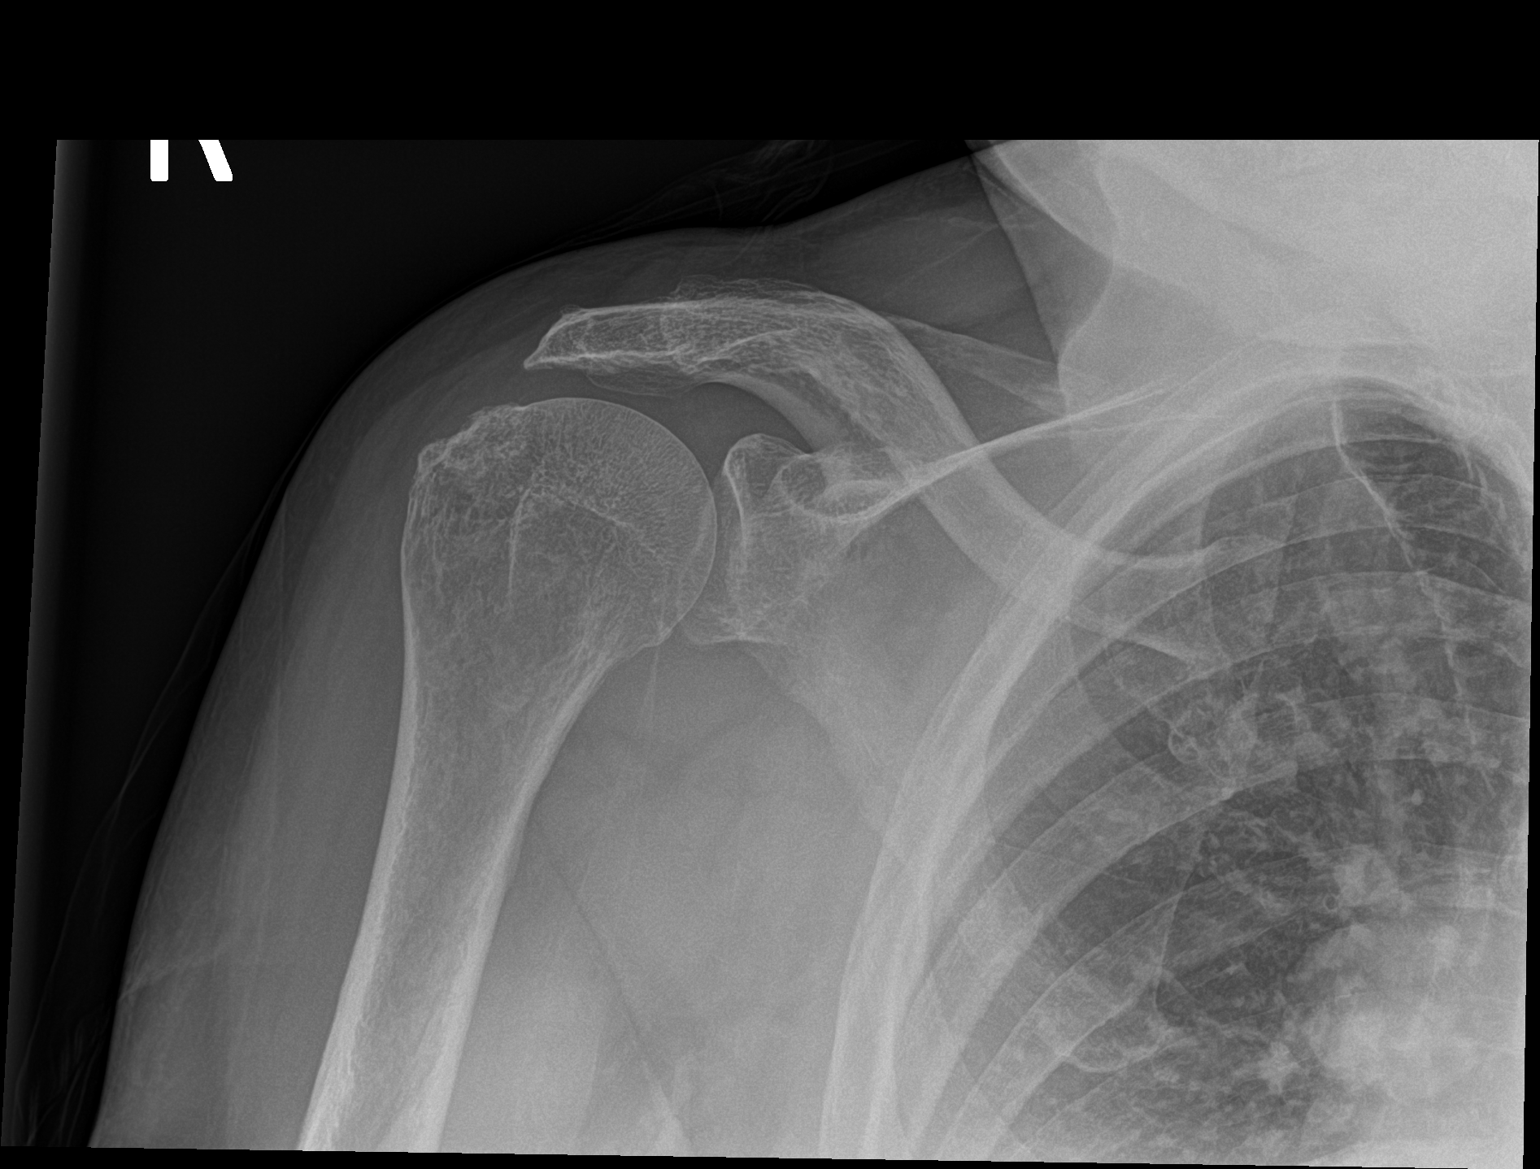
[im 2/3]
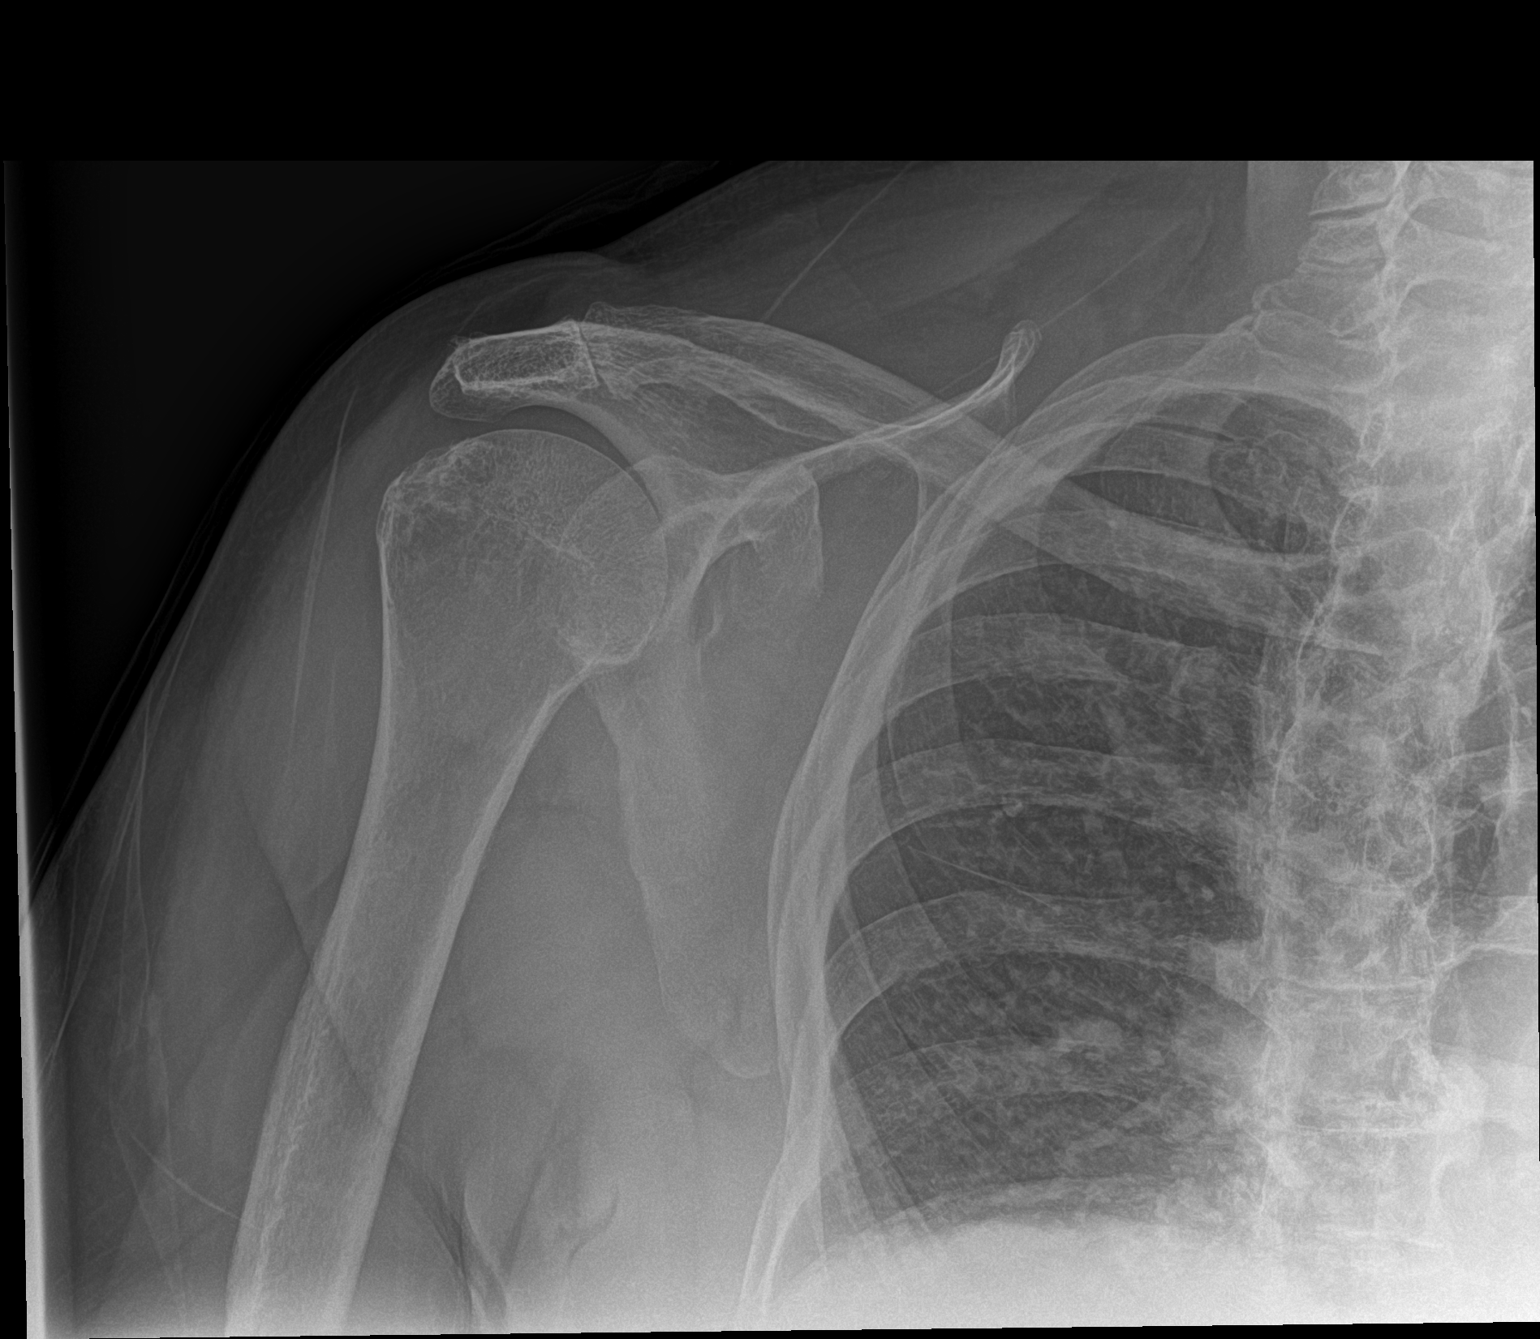
[im 3/3]
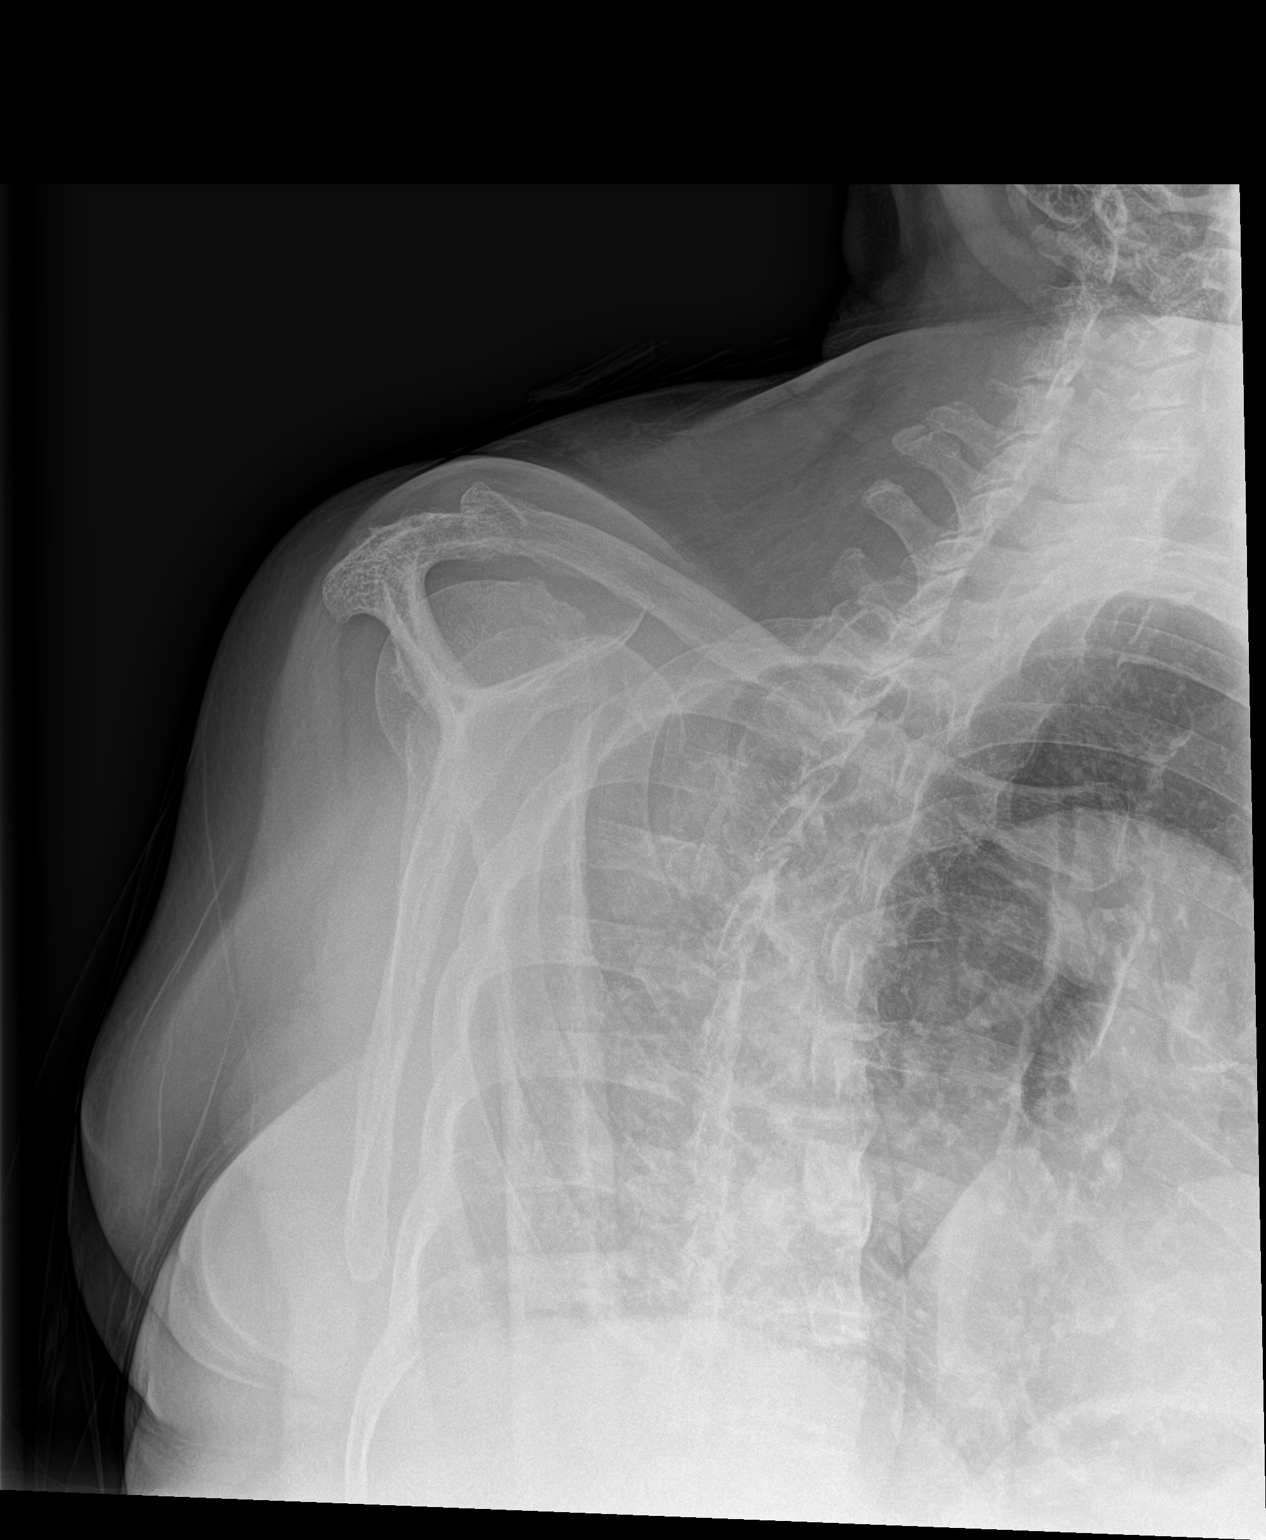

[3 of 3 positions shown; findings below may reference images not displayed]

FINDINGS: Osseous alignment is normal. No fracture line or displaced fracture
fragment. No acute or suspicious osseous lesion. No significant
degenerative change. Overlying soft tissues are unremarkable.
IMPRESSION: Negative.

## 2020-07-27 MED ORDER — OXYCODONE HCL 5 MG PO TABS: 5.0000 mg | ORAL_TABLET | Freq: Three times a day (TID) | ORAL | 0 refills | Status: DC | PRN

## 2020-07-27 MED ORDER — METHYLPREDNISOLONE 4 MG PO TBPK: ORAL_TABLET | ORAL | 0 refills | Status: DC

## 2020-07-27 MED ORDER — OXYCODONE HCL 5 MG PO TABS
5.0000 mg | ORAL_TABLET | Freq: Once | ORAL | Status: AC
Start: 2020-07-27 — End: 2020-07-27
  Administered 2020-07-27: 5 mg via ORAL
  Filled 2020-07-27: qty 1

## 2020-07-27 NOTE — ED Triage Notes (Signed)
Pt fell on Saturday and was treated at 2X at [facility] for same. Pt complains of neck an shoulder pain from the same pain. Pt states that she is here to get her pain and medication under control.  Pt has contusion from same fall on forehead.  Pt. Is not on thinners.   Pt does have Parkinson's disease.

## 2020-07-27 NOTE — Discharge Instructions (Signed)
X-ray showed no fracture.  Overall suspect a soft tissue injury.  Use a sling as needed.  Take narcotic pain medicine Roxicodone as prescribed.  Stop taking Valium/diazepam.  Do not mix this medicine with any other alcohol or drugs.  Recommend 1000 mg of Tylenol every 6 hours as needed for pain as well.  Follow-up with sports medicine.

## 2020-07-27 NOTE — ED Provider Notes (Signed)
[facility] EMERGENCY DEPT Provider Note   CSN: [id number] Arrival date & time: 07/27/20  1837     History Chief Complaint  Patient presents with  . Mallory Klein is a 75 y.o. female.  The history is provided by the patient.  Illness Location:  Right shoulder, back, neck Severity:  Mild Timing:  Intermittent Progression:  Waxing and waning Chronicity:  New Context:  Fall several days ago, still with pain and muscle relaxers not helping.  Relieved by:  Nothing Worsened by:  Movement Associated symptoms: no chest pain, no headaches and no myalgias        Past Medical History:  Diagnosis Date  . Abnormal CBC 02/05/2015   [name] 12/16 new: normocytic anemia and thrombocytosis  . Adjustment disorder with mixed anxiety and depressed mood 02/15/2007   Chronic  Chronic pain Grief, stress Effexor XR  Dad died in 2023-03-07  . Asthma   . Diverticulosis of colon (without mention of hemorrhage)   . Esophageal stricture   . Essential hypertension 01/26/2007   Chronic Verapamil   . GERD (gastroesophageal reflux disease) 02/15/2007   Chronic    . Heart murmur   . Hyperglycemia 03/02/2011   Mild    . Insomnia disorder 12/08/2018   10/20 Carbid/Lev dose was increased - c/o hard time falling asleep (6 am) getting up at 12 am, poor sleep. Try Temazepam 15-30 mg at 11-1:30 pm   . Internal hemorrhoids without mention of complication   . Iron deficiency anemia 04/23/2017  . Mild neurocognitive disorder, likely due to Parkinson's disease 02/09/2019  . OSA (obstructive sleep apnea) 02/14/2010   In lab study (May 2018): AHI 25. Only had 45 minutes of sleep secondary to frequent awakenings secondary to sleep apnea. autocpap 5-15 cm water.   . Osteoarthritis   . Parkinson disease ([place]) 03-06-2014   2015 2017  Primidone - d/c 2019 Parkinson's: Sinmet IR. Dr Tat  . Postherpetic trigeminal neuralgia 01/26/2007   Qualifier: Diagnosis of  By: Marca Ancona RMA, [name]    . Shortness  of breath dyspnea    with allergies  . Thrombocytosis 05/24/2017  . Urinary incontinence 01/26/2007   Chronic  10/17 Detrol LA   . Vitamin D deficiency 01/26/2019    Patient Active Problem List   Diagnosis Date Noted  . Anosmia 05/02/2020  . Grief reaction 03/27/2020  . Memory loss 12/13/2019  . Food poisoning 09/11/2019  . B12 deficiency 06/14/2019  . Mild neurocognitive disorder, likely due to Parkinson's disease 02/09/2019  . Vitamin D deficiency 01/26/2019  . Sweating profusely 12/08/2018  . Insomnia disorder 12/08/2018  . Cerumen impaction 10/19/2018  . Lumbar facet arthropathy 02/10/2018  . Thrombocytosis 05/24/2017  . Iron deficiency anemia 04/23/2017  . Cough 05/12/2016  . Pseudophakia of both eyes 10/09/2015  . Abnormal CBC 02/05/2015  . Edema 10/15/2014  . S/P lumbar laminectomy 02/20/2014  . Pre-ulcerative calluses 03/06/2014  . Parkinson disease ([place]) March 06, 2014  . Cold sore 10/17/2013  . Low back pain 07/25/2013  . Nuclear sclerosis 02/08/2013  . Heel ulcer ([place]) 03/08/2012  . Rash 12/04/2011  . Neck pain on left side 08/06/2011  . Presence of unspecified artificial knee joint 04/22/2011  . Hyperglycemia 03/02/2011  . Acquired spondylolisthesis 12/25/2010  . Degeneration of lumbosacral intervertebral disc 12/25/2010  . Arthritis of right knee 12/03/2010  . Knee pain, right 09/08/2010  . OSA (obstructive sleep apnea) 02/14/2010  . BRONCHITIS, ACUTE 04/05/2009  . Vertigo 12/02/2007  . Other  abnormal glucose 12/02/2007  . Otitis media 10/26/2007  . Other malaise and fatigue 05/11/2007  . Adjustment disorder with mixed anxiety and depressed mood 02/15/2007  . GERD (gastroesophageal reflux disease) 02/15/2007  . Osteoarthritis 02/15/2007  . Postherpetic trigeminal neuralgia 01/26/2007  . Herpes zoster 01/26/2007  . Morbid obesity 01/26/2007  . Migraine headaches 01/26/2007  . Essential hypertension 01/26/2007  . Asthma 01/26/2007  . Urinary incontinence  01/26/2007    Past Surgical History:  Procedure Laterality Date  . ABDOMINAL HYSTERECTOMY    . BREAST CYST EXCISION Left 1980's  . LUMBAR LAMINECTOMY/DECOMPRESSION MICRODISCECTOMY Bilateral 02/20/2014   Procedure: LUMBAR TWO THREE, LUMBAR THREE FOUR LUMBAR LAMINECTOMY/DECOMPRESSION MICRODISCECTOMY 2 LEVELS;  Surgeon: Eustace Moore, MD;  Location: [facility];  Service: Neurosurgery;  Laterality: Bilateral;  . SPLENECTOMY    . TIBIA FRACTURE SURGERY Right   . TIBIA FRACTURE SURGERY Left   . TOTAL KNEE ARTHROPLASTY Right 2013     OB History   No obstetric history on file.     Family History  Problem Relation Age of Onset  . Hypertension Mother   . Diabetes type II Mother   . Asthma Mother   . Diabetes Mother   . Dementia Mother   . Prostate cancer Father   . Mental illness Father        dementia  . Suicidality Son   . Stroke Brother        died in prison  . Colon cancer Neg Hx   . Rectal cancer Neg Hx     Social History   Tobacco Use  . Smoking status: Never Smoker  . Smokeless tobacco: Never Used  Vaping Use  . Vaping Use: Never used  Substance Use Topics  . Alcohol use: No    Alcohol/week: 2.0 standard drinks    Types: 2 Glasses of wine per week    Comment: wine on occ   . Drug use: No    Home Medications Prior to Admission medications   Medication Sig Start Date End Date Taking? Authorizing Provider  methylPREDNISolone (MEDROL DOSEPAK) 4 MG TBPK tablet Follow package insert 07/27/20  Yes Gaither Biehn, DO  oxyCODONE (ROXICODONE) 5 MG immediate release tablet Take 1 tablet (5 mg total) by mouth every 8 (eight) hours as needed for up to 10 doses for severe pain. 07/27/20  Yes Onetha Gaffey, DO  albuterol (VENTOLIN HFA) 108 (90 Base) MCG/ACT inhaler Inhale 2 puffs into the lungs every 6 (six) hours as needed for wheezing or shortness of breath. 06/12/19   Plotnikov, Evie Lacks, MD  celecoxib (CELEBREX) 200 MG capsule Take 1 capsule (200 mg total) by mouth 2 (two)  times daily as needed for moderate pain. 03/27/20   Plotnikov, Evie Lacks, MD  cetirizine (ZYRTEC) 10 MG tablet Take 1 tablet (10 mg total) by mouth daily. 04/15/20   Plotnikov, Evie Lacks, MD  cyanocobalamin (,VITAMIN B-12,) 1000 MCG/ML injection Inject 1 mL (1,000 mcg total) into the skin every 14 (fourteen) days. 07/12/19   Plotnikov, Evie Lacks, MD  diazepam (VALIUM) 5 MG tablet Take 1 tablet (5 mg total) by mouth 2 (two) times daily. 07/25/20   Isla Pence, MD  diclofenac Sodium (VOLTAREN) 1 % GEL Apply 4 g topically daily as needed (pain). 04/25/13   [provider]  docusate sodium (COLACE) 100 MG capsule Take 100 mg by mouth at bedtime. 06/26/19   [provider]  hydroxypropyl methylcellulose / hypromellose (ISOPTO TEARS / GONIOVISC) 2.5 % ophthalmic solution Place 1  drop into both eyes 3 (three) times daily as needed for dry eyes.    [provider]  hydrOXYzine (ATARAX/VISTARIL) 25 MG tablet Take 1 tablet (25 mg total) by mouth every 8 (eight) hours as needed for itching (itching from Butrans patch). Patient taking differently: Take 25 mg by mouth 2 (two) times daily as needed for itching (itching from Butrans patch). 06/12/19   Plotnikov, Evie Lacks, MD  montelukast (SINGULAIR) 10 MG tablet Take 1 tablet (10 mg total) by mouth daily. 11/27/19   Plotnikov, Evie Lacks, MD  OVER THE COUNTER MEDICATION Take 1 tablet by mouth at bedtime. Lions mane extract    [provider]  pantoprazole (PROTONIX) 40 MG tablet Take 1 tablet (40 mg total) by mouth daily. 03/27/20   Plotnikov, Evie Lacks, MD  polyethylene glycol (MIRALAX / GLYCOLAX) 17 g packet Take 17 g by mouth daily as needed for mild constipation.    [provider]  tizanidine (ZANAFLEX) 2 MG capsule Take 1 capsule (2 mg total) by mouth 3 (three) times daily. 07/23/20   Hazel Sams, PA-C  verapamil (VERELAN PM) 120 MG 24 hr capsule Take 1 capsule (120 mg total) by mouth at bedtime. 03/27/20   Plotnikov, Evie Lacks, MD  carbidopa-levodopa (SINEMET CR) 50-200 MG tablet Take 1 pill 3 times daily at 11 AM, 5 PM and 10 PM daily. Patient taking differently: Take 1 tablet by mouth See admin instructions. Take 1 pill 3 times daily at 11 AM, 5 PM and 10 PM daily. 11/22/19 07/23/20  Star Age, MD  donepezil (ARICEPT) 5 MG tablet Take 1 tablet (5 mg total) by mouth at bedtime. Patient not taking: Reported on 07/21/2020 11/27/19 07/23/20  Plotnikov, Evie Lacks, MD  venlafaxine XR (EFFEXOR-XR) 150 MG 24 hr capsule TAKE 1 CAPSULE BY MOUTH  EVERY DAY WITH BREAKFAST Patient not taking: Reported on 07/21/2020 05/29/19 07/23/20  Plotnikov, Evie Lacks, MD  zolpidem (AMBIEN) 10 MG tablet Take 0.5-1 tablets (5-10 mg total) by mouth at bedtime as needed for sleep. Patient taking differently: Take 10 mg by mouth at bedtime. 04/22/20 07/23/20  Plotnikov, Evie Lacks, MD    Allergies    Ace inhibitors, Benicar [olmesartan], Aspirin, and Citalopram hydrobromide  Review of Systems   Review of Systems  Cardiovascular: Negative for chest pain.  Musculoskeletal: Positive for arthralgias, back pain and neck pain. Negative for myalgias.  Skin: Negative for wound.  Neurological: Negative for dizziness, tremors, seizures, syncope, facial asymmetry, speech difficulty, weakness, light-headedness, numbness and headaches.    Physical Exam Updated Vital Signs BP (!) 166/120 (BP Location: Right Arm)   Pulse 96   Temp 98.6 F (37 C) (Oral)   Resp 19   Ht 5\' 1"  (1.549 m)   Wt 72.6 kg   SpO2 100%   BMI 30.23 kg/m   Physical Exam Constitutional:      General: She is not in acute distress.    Appearance: She is not ill-appearing.  Eyes:     Extraocular Movements: Extraocular movements intact.     Pupils: Pupils are equal, round, and reactive to light.  Musculoskeletal:        General: Tenderness present. Normal range of motion.     Cervical back: Normal range of motion.     Comments: To the right shoulder, right trapezius, right  paraspinal cervical muscles on the right, no midline spinal tenderness  Skin:    General: Skin is warm.     Capillary Refill: Capillary refill takes  less than 2 seconds.  Neurological:     General: No focal deficit present.     Mental Status: She is alert and oriented to person, place, and time.     Cranial Nerves: No cranial nerve deficit.     Sensory: No sensory deficit.     Motor: No weakness.     Coordination: Coordination normal.     Gait: Gait normal.     Comments: 5+ out of 5 strength throughout, normal sensation     ED Results / Procedures / Treatments   Labs (all labs ordered are listed, but only abnormal results are displayed) Labs Reviewed - No data to display  EKG None  Radiology DG Shoulder Right  Result Date: 07/27/2020 CLINICAL DATA:  Pain EXAM: RIGHT SHOULDER - 2+ VIEW COMPARISON:  None. FINDINGS: Osseous alignment is normal. No fracture line or displaced fracture fragment. No acute or suspicious osseous lesion. No significant degenerative change. Overlying soft tissues are unremarkable. IMPRESSION: Negative. Electronically Signed   By: Franki Cabot M.D.   On: 07/27/2020 19:52    Procedures Procedures   Medications Ordered in ED Medications  oxyCODONE (Oxy IR/ROXICODONE) immediate release tablet 5 mg (5 mg Oral Given 07/27/20 1917)    ED Course  I have reviewed the triage vital signs and the nursing notes.  Pertinent labs & imaging results that were available during my care of the patient were reviewed by me and considered in my medical decision making (see chart for details).    MDM Rules/Calculators/A&P                          KENIAH KLEMMER is here with ongoing pain after a fall several days ago.  Normal vitals.  No fever.  No new injury.  Has been on muscle relaxants with no improvement.  Had negative head CT, neck CT, scapular x-ray and thoracic x-ray.  Pain appears to be mostly in the right shoulder.  Right shoulder x-ray was obtained and was  also negative.  We will place her in a sling and write her for oxycodone for breakthrough pain.  Recommend that she discontinue muscle relaxants and focus on Tylenol.  We will have her follow-up with sports medicine as suspect may be soft tissue injury.  Activity as tolerated.  Discharged in good condition.  Neurovascularly neuromuscularly intact.  No concern for cord injury.  This chart was dictated using voice recognition software.  Despite best efforts to proofread,  errors can occur which can change the documentation meaning.    Final Clinical Impression(s) / ED Diagnoses Final diagnoses:  Acute pain of right shoulder    Rx / DC Orders ED Discharge Orders         Ordered    oxyCODONE (ROXICODONE) 5 MG immediate release tablet  Every 8 hours PRN        07/27/20 1953    methylPREDNISolone (MEDROL DOSEPAK) 4 MG TBPK tablet        07/27/20 1953           Lennice Sites, DO 07/27/20 1956

## 2020-07-29 ENCOUNTER — Telehealth: Payer: Self-pay | Admitting: Internal Medicine

## 2020-07-29 NOTE — Telephone Encounter (Signed)
[facility] FYI 6.4.22:  ---Caller states she fell last Saturday and was seen in ED, had XR's and was sent home. Seen at [facility] on Monday for continued pain, had additional XR's and was given RX for diazepam. Still having pain from R shoulder, upper back, upper arm and is requesting medication for pain. Has taken Advil 400mg  but doesn't help the pain.  Advised to go to ED now

## 2020-07-30 NOTE — Telephone Encounter (Signed)
P has made appt for tomorrow 07/31/20 for f/u.Marland KitchenJohny Klein

## 2020-07-31 ENCOUNTER — Ambulatory Visit (INDEPENDENT_AMBULATORY_CARE_PROVIDER_SITE_OTHER): Payer: Medicare Other | Admitting: Internal Medicine

## 2020-07-31 ENCOUNTER — Telehealth: Payer: Self-pay | Admitting: Internal Medicine

## 2020-07-31 ENCOUNTER — Other Ambulatory Visit: Payer: Self-pay

## 2020-07-31 ENCOUNTER — Encounter: Payer: Self-pay | Admitting: Internal Medicine

## 2020-07-31 DIAGNOSIS — R55 Syncope and collapse: Secondary | ICD-10-CM

## 2020-07-31 DIAGNOSIS — E559 Vitamin D deficiency, unspecified: Secondary | ICD-10-CM

## 2020-07-31 DIAGNOSIS — S40019A Contusion of unspecified shoulder, initial encounter: Secondary | ICD-10-CM

## 2020-07-31 DIAGNOSIS — S060X9A Concussion with loss of consciousness of unspecified duration, initial encounter: Secondary | ICD-10-CM | POA: Insufficient documentation

## 2020-07-31 DIAGNOSIS — W19XXXD Unspecified fall, subsequent encounter: Secondary | ICD-10-CM

## 2020-07-31 DIAGNOSIS — S060X1D Concussion with loss of consciousness of 30 minutes or less, subsequent encounter: Secondary | ICD-10-CM | POA: Diagnosis not present

## 2020-07-31 DIAGNOSIS — S40011D Contusion of right shoulder, subsequent encounter: Secondary | ICD-10-CM

## 2020-07-31 DIAGNOSIS — W19XXXA Unspecified fall, initial encounter: Secondary | ICD-10-CM | POA: Insufficient documentation

## 2020-07-31 DIAGNOSIS — S060XAA Concussion with loss of consciousness status unknown, initial encounter: Secondary | ICD-10-CM | POA: Insufficient documentation

## 2020-07-31 HISTORY — DX: Contusion of unspecified shoulder, initial encounter: S40.019A

## 2020-07-31 MED ORDER — OXYCODONE-ACETAMINOPHEN 10-325 MG PO TABS: 0.5000 | ORAL_TABLET | Freq: Four times a day (QID) | ORAL | 0 refills | Status: AC | PRN

## 2020-07-31 NOTE — Assessment & Plan Note (Signed)
severe R shoulder pain after a fall on 07/20/20, neck pain. There was a LOC. Pt went to ER 4 timeas. All CTs,X rays were (-)  [facility] ref

## 2020-07-31 NOTE — Assessment & Plan Note (Signed)
a fall on 07/20/20

## 2020-07-31 NOTE — Progress Notes (Signed)
Subjective:  Patient ID: Mallory Klein, female    DOB: 06-19-1945  Age: 75 y.o. MRN: [id number]  CC: Follow-up (F/u from urgent care. Pt states she fell a week ago.. she states she does not remember how/why she fell)   HPI Mallory Klein presents for severe R shoulder pain after a fall on 07/20/20, neck pain. There was a LOC. Pt went to ER 4 timeas. All CTs,X rays were (-)  Outpatient Medications Prior to Visit  Medication Sig Dispense Refill   albuterol (VENTOLIN HFA) 108 (90 Base) MCG/ACT inhaler Inhale 2 puffs into the lungs every 6 (six) hours as needed for wheezing or shortness of breath. 18 g 11   celecoxib (CELEBREX) 200 MG capsule Take 1 capsule (200 mg total) by mouth 2 (two) times daily as needed for moderate pain. 60 capsule 3   cetirizine (ZYRTEC) 10 MG tablet Take 1 tablet (10 mg total) by mouth daily. 90 tablet 3   cyanocobalamin (,VITAMIN B-12,) 1000 MCG/ML injection Inject 1 mL (1,000 mcg total) into the skin every 14 (fourteen) days. 10 mL 4   diclofenac Sodium (VOLTAREN) 1 % GEL Apply 4 g topically daily as needed (pain).     docusate sodium (COLACE) 100 MG capsule Take 100 mg by mouth at bedtime.     hydrOXYzine (ATARAX/VISTARIL) 25 MG tablet Take 1 tablet (25 mg total) by mouth every 8 (eight) hours as needed for itching (itching from Butrans patch). (Patient taking differently: Take 25 mg by mouth 2 (two) times daily as needed for itching (itching from Butrans patch).) 60 tablet 0   montelukast (SINGULAIR) 10 MG tablet Take 1 tablet (10 mg total) by mouth daily. 90 tablet 3   OVER THE COUNTER MEDICATION Take 1 tablet by mouth at bedtime. Lions mane extract     oxyCODONE (ROXICODONE) 5 MG immediate release tablet Take 1 tablet (5 mg total) by mouth every 8 (eight) hours as needed for up to 10 doses for severe pain. 10 tablet 0   pantoprazole (PROTONIX) 40 MG tablet Take 1 tablet (40 mg total) by mouth daily. 30 tablet 11   polyethylene glycol (MIRALAX / GLYCOLAX) 17  g packet Take 17 g by mouth daily as needed for mild constipation.     verapamil (VERELAN PM) 120 MG 24 hr capsule Take 1 capsule (120 mg total) by mouth at bedtime. 30 capsule 11   diazepam (VALIUM) 5 MG tablet Take 1 tablet (5 mg total) by mouth 2 (two) times daily. (Patient not taking: Reported on 07/31/2020) 10 tablet 0   hydroxypropyl methylcellulose / hypromellose (ISOPTO TEARS / GONIOVISC) 2.5 % ophthalmic solution Place 1 drop into both eyes 3 (three) times daily as needed for dry eyes. (Patient not taking: Reported on 07/31/2020)     methylPREDNISolone (MEDROL DOSEPAK) 4 MG TBPK tablet Follow package insert (Patient not taking: Reported on 07/31/2020) 21 each 0   tizanidine (ZANAFLEX) 2 MG capsule Take 1 capsule (2 mg total) by mouth 3 (three) times daily. (Patient not taking: Reported on 07/31/2020) 14 capsule 0   No facility-administered medications prior to visit.    ROS: Review of Systems  Constitutional:  Negative for activity change, appetite change, chills, fatigue and unexpected weight change.  HENT:  Negative for congestion, mouth sores and sinus pressure.   Eyes:  Negative for visual disturbance.  Respiratory:  Negative for cough and chest tightness.   Cardiovascular:  Negative for palpitations.  Gastrointestinal:  Negative for abdominal pain and nausea.  Genitourinary:  Negative for difficulty urinating, frequency and vaginal pain.  Musculoskeletal:  Positive for arthralgias, back pain and gait problem.  Skin:  Negative for pallor and rash.  Neurological:  Positive for syncope and light-headedness. Negative for dizziness, tremors, seizures, weakness, numbness and headaches.  Psychiatric/Behavioral:  Positive for decreased concentration. Negative for confusion and sleep disturbance. The patient is nervous/anxious.    Objective:  BP (!) 162/101 (BP Location: Left Arm)   Pulse (!) 103   Temp 99.1 F (37.3 C) (Oral)   SpO2 96%   BP Readings from Last 3 Encounters:  07/31/20  (!) 162/101  07/27/20 (!) 166/120  07/25/20 132/74    Wt Readings from Last 3 Encounters:  07/27/20 160 lb (72.6 kg)  07/21/20 160 lb (72.6 kg)  07/18/20 167 lb (75.8 kg)    Physical Exam Constitutional:      General: She is not in acute distress.    Appearance: She is well-developed. She is obese.  HENT:     Head: Normocephalic.     Right Ear: External ear normal.     Left Ear: External ear normal.     Nose: Nose normal.  Eyes:     General:        Right eye: No discharge.        Left eye: No discharge.     Conjunctiva/sclera: Conjunctivae normal.     Pupils: Pupils are equal, round, and reactive to light.  Neck:     Thyroid: No thyromegaly.     Vascular: No JVD.     Trachea: No tracheal deviation.  Cardiovascular:     Rate and Rhythm: Normal rate and regular rhythm.     Heart sounds: Normal heart sounds.  Pulmonary:     Effort: No respiratory distress.     Breath sounds: No stridor. No wheezing.  Abdominal:     General: Bowel sounds are normal. There is no distension.     Palpations: Abdomen is soft. There is no mass.     Tenderness: There is no abdominal tenderness. There is no guarding or rebound.  Musculoskeletal:        General: Tenderness present.     Cervical back: Normal range of motion and neck supple. No rigidity.  Lymphadenopathy:     Cervical: No cervical adenopathy.  Skin:    Findings: No erythema or rash.  Neurological:     Mental Status: She is oriented to person, place, and time.     Cranial Nerves: No cranial nerve deficit.     Motor: Weakness present. No abnormal muscle tone.     Coordination: Coordination abnormal.     Gait: Gait abnormal.     Deep Tendon Reflexes: Reflexes normal.  Psychiatric:        Behavior: Behavior normal.        Thought Content: Thought content normal.        Judgment: Judgment normal.   Patient is using a walker.  Her gait is antalgic Right shoulder with pain  Lab Results  Component Value Date   WBC 9.0  07/18/2020   HGB 13.6 07/18/2020   HCT 42.1 07/18/2020   PLT 370 07/18/2020   GLUCOSE 99 06/25/2020   CHOL 219 (H) 02/05/2015   TRIG 69.0 02/05/2015   HDL 73.70 02/05/2015   LDLDIRECT 109.8 02/10/2010   LDLCALC 132 (H) 02/05/2015   ALT 2 06/25/2020   AST 11 06/25/2020   NA 138 06/25/2020   K 4.2 06/25/2020   CL 100  06/25/2020   CREATININE 0.85 06/25/2020   BUN 15 06/25/2020   CO2 34 (H) 06/25/2020   TSH 0.67 06/25/2020   INR 0.97 02/07/2014   HGBA1C 5.5 06/25/2020    DG Shoulder Right  Result Date: 07/27/2020 CLINICAL DATA:  Pain EXAM: RIGHT SHOULDER - 2+ VIEW COMPARISON:  None. FINDINGS: Osseous alignment is normal. No fracture line or displaced fracture fragment. No acute or suspicious osseous lesion. No significant degenerative change. Overlying soft tissues are unremarkable. IMPRESSION: Negative. Electronically Signed   By: Franki Cabot M.D.   On: 07/27/2020 19:52    Assessment & Plan:   There are no diagnoses linked to this encounter.   No orders of the defined types were placed in this encounter.    Follow-up: No follow-ups on file.  Walker Kehr, MD

## 2020-07-31 NOTE — Assessment & Plan Note (Signed)
New - severe R shoulder pain after a fall on 07/20/20, neck pain. There was a LOC. Pt went to ER 4 timeas. All CTs, X rays were (-)

## 2020-07-31 NOTE — Telephone Encounter (Signed)
   Pharmacy calling to advise patient is also using Butrans patch prescribed by Pain Management  Should she also be using oxyCODONE-acetaminophen (PERCOCET) 10-325 MG tablet

## 2020-07-31 NOTE — Patient Instructions (Signed)
Concussion, Adult  A concussion is a brain injury from a hard, direct hit (trauma) to your head or body. This direct hit causes your brain to quickly shake back and forth inside your skull. A concussion may also be called a mild traumatic brain injury (TBI). Healing from this injury can take time. What are the causes? This condition is caused by:  A direct hit to your head, such as: ? Running into a player during a game. ? Being hit in a fight. ? Hitting your head on a hard surface.  A quick and sudden movement of the head or neck, such as in a car crash. What are the signs or symptoms? The signs of a concussion can be hard to notice. They may be missed by you, family members, and doctors. You may look fine on the outside but may not act or feel normal. Physical symptoms  Headaches.  Being dizzy.  Problems with body balance.  Being sensitive to light or noise.  Vomiting or feeling like you may vomit.  Being tired.  Problems seeing or hearing.  Not sleeping or eating as you used to.  Seizure. Mental and emotional symptoms  Feeling grouchy (irritable).  Having mood changes.  Problems remembering things.  Trouble focusing your mind (concentrating), organizing, or making decisions.  Being slow to think, act, react, speak, or read.  Feeling worried or nervous (anxious).  Feeling sad (depressed). How is this treated? This condition may be treated by:  Stopping sports or activity if you are injured. If you hit your head or have signs of concussion: ? Do not return to sports or activities the same day. ? Get checked by a doctor before you return to your activities.  Resting your body and your mind.  Being watched carefully, often at home.  Medicines to help with symptoms such as: ? Headaches. ? Feeling like you may vomit. ? Problems with sleep.  Avoiding alcohol and drugs.  Being asked to go to a concussion clinic or a place to help you recover  (rehabilitation center). Recovery from a concussion can take time. Return to activities only:  When you are fully healed.  When your doctor says it is safe. Avoid taking strong pain medicines (opioids) for a concussion. Follow these instructions at home: Activity  Limit activities that need a lot of thought or focus, such as: ? Homework or work for your job. ? Watching TV. ? Using the computer or phone. ? Playing memory games and puzzles.  Rest. Rest helps your brain heal. Make sure you: ? Get plenty of sleep. Most adults should get 7-9 hours of sleep each night. ? Rest during the day. Take naps or breaks when you feel tired.  Avoid activity like exercise until your doctor says its safe. Stop any activity that makes symptoms worse.  Do not do activities that could cause a second concussion, such as riding a bike or playing sports.  Ask your doctor when you can return to your normal activities, such as school, work, sports, and driving. Your ability to react may be slower. Do not do these activities if you are dizzy. General instructions  Take over-the-counter and prescription medicines only as told by your doctor.  Do not drink alcohol until your doctor says you can.  Watch your symptoms and tell other people to do the same. Other problems can occur after a concussion. Older adults have a higher risk of serious problems.  Tell your work Freight forwarder, teachers, Government social research officer, school  counselor, coach, or athletic trainer about your injury and symptoms. Tell them about what you can or cannot do.  Keep all follow-up visits as told by your doctor. This is important.   How is this prevented? It is very important that you do not get another brain injury. In rare cases, another injury can cause brain damage that will not go away, brain swelling, or death. The risk of this is greatest in the first 7-10 days after a head injury. To avoid injuries:  Stop activities that could lead to a second  concussion, such as contact sports, until your doctor says it is okay.  When you return to sports or activities: ? Do not crash into other players. This is how most concussions happen. ? Follow the rules. ? Respect other players. Do not engage in violent behavior while playing.  Get regular exercise. Do strength and balance training.  Wear a helmet that fits you well during sports, biking, or other activities.  Helmets can help protect you from serious skull and brain injuries, but they do not protect you from a concussion. Even when wearing a helmet, you should avoid being hit in the head. Contact a doctor if:  Your symptoms do not get better.  You have new symptoms.  You have another injury. Get help right away if:  You have bad headaches or your headaches get worse.  You feel weak or numb in any part of your body.  You feel mixed up (confused).  Your balance gets worse.  You vomit often.  You feel more sleepy than normal.  You cannot speak well, or have slurred speech.  You have a seizure.  Others have trouble waking you up.  You have changes in how you act.  You have changes in how you see (vision).  You pass out (lose consciousness). These symptoms may be an emergency. Do not wait to see if the symptoms will go away. Get medical help right away. Call your local emergency services (911 in the [place] Do not drive yourself to the hospital. Summary  A concussion is a brain injury from a hard, direct hit (trauma) to your head or body.  This condition is treated with rest and careful watching of symptoms.  Ask your doctor when you can return to your normal activities, such as school, work, or driving.  Get help right away if you have a very bad headache, feel weak in any part of your body, have a seizure, have changes in how you act or see, or if you are mixed up or more sleepy than normal. This information is not intended to replace advice given to you by your  health care provider. Make sure you discuss any questions you have with your health care provider. Document Revised: 12/22/2018 Document Reviewed: 12/22/2018 Elsevier Patient Education  [place].

## 2020-08-01 ENCOUNTER — Other Ambulatory Visit: Payer: Self-pay | Admitting: Internal Medicine

## 2020-08-01 DIAGNOSIS — M47816 Spondylosis without myelopathy or radiculopathy, lumbar region: Secondary | ICD-10-CM | POA: Diagnosis not present

## 2020-08-01 DIAGNOSIS — G894 Chronic pain syndrome: Secondary | ICD-10-CM | POA: Diagnosis not present

## 2020-08-01 DIAGNOSIS — M48061 Spinal stenosis, lumbar region without neurogenic claudication: Secondary | ICD-10-CM | POA: Diagnosis not present

## 2020-08-01 DIAGNOSIS — M1712 Unilateral primary osteoarthritis, left knee: Secondary | ICD-10-CM | POA: Diagnosis not present

## 2020-08-01 NOTE — Telephone Encounter (Signed)
Called pharmacy spoke w/ [place] (pharmacist) gave her  MD response. [place] states she has also reach out to the pain management since she is under contract will have to get ok from pain m,management to fill.Marland KitchenJohny Chess

## 2020-08-01 NOTE — Telephone Encounter (Signed)
Noted.  I am aware.  She has an acute injury.  She informed her [facility] prescriber that she injured her shoulder and was receiving extra pain meds.Mallory Klein

## 2020-08-02 ENCOUNTER — Telehealth: Payer: Self-pay | Admitting: Radiology

## 2020-08-02 ENCOUNTER — Telehealth: Payer: Self-pay | Admitting: Internal Medicine

## 2020-08-02 ENCOUNTER — Encounter: Payer: Self-pay | Admitting: Surgery

## 2020-08-02 ENCOUNTER — Other Ambulatory Visit: Payer: Self-pay

## 2020-08-02 ENCOUNTER — Ambulatory Visit: Payer: BC Managed Care – PPO | Admitting: Surgery

## 2020-08-02 ENCOUNTER — Ambulatory Visit (INDEPENDENT_AMBULATORY_CARE_PROVIDER_SITE_OTHER): Payer: Medicare Other | Admitting: Surgery

## 2020-08-02 VITALS — Ht 61.0 in | Wt 160.0 lb

## 2020-08-02 DIAGNOSIS — M7541 Impingement syndrome of right shoulder: Secondary | ICD-10-CM

## 2020-08-02 DIAGNOSIS — M47812 Spondylosis without myelopathy or radiculopathy, cervical region: Secondary | ICD-10-CM

## 2020-08-02 DIAGNOSIS — R55 Syncope and collapse: Secondary | ICD-10-CM

## 2020-08-02 DIAGNOSIS — M25511 Pain in right shoulder: Secondary | ICD-10-CM

## 2020-08-02 NOTE — Telephone Encounter (Signed)
Jeneen Rinks asked that I call Dr. Judeen Hammans office and advise that while examining patient today, she describes this fall and one other that she has had more as blacking out and not like the falls she has had from Parkinson's. Jeneen Rinks is concerned that she is having syncopal episodes and may need a cardiology consult. I spoke with [place] in Dr. Judeen Hammans office and she will get message to him.

## 2020-08-02 NOTE — Progress Notes (Signed)
Office Visit Note   Patient: Mallory Klein           Date of Birth: Aug 03, 1945           MRN: [id number] Visit Date: 08/02/2020              Requested by: Cassandria Anger, MD [place],  [place] [address] PCP: Plotnikov, Evie Lacks, MD   Assessment & Plan: Visit Diagnoses:  1. Acute pain of right shoulder   2. Impingement syndrome of right shoulder   3. Spondylosis without myelopathy or radiculopathy, cervical region   4. Syncope, unspecified syncope type     Plan: With patient's multiple ED visits ongoing right shoulder pain and current exam findings I recommend getting an MRI to rule out rotator cuff tear.  Follow-up with Dr. Lorin Mercy after completion to discuss results.  Follow-Up Instructions: No follow-ups on file.   Orders:  Orders Placed This Encounter  Procedures   Large Joint Inj   MR SHOULDER RIGHT WO CONTRAST   No orders of the defined types were placed in this encounter.     Procedures: Large Joint Inj: R subacromial bursa on 08/02/2020 3:35 PM Indications: pain Details: 25 G 1.5 in needle, posterior approach Medications: 5 mL bupivacaine 0.5 %; 40 mg methylPREDNISolone acetate 40 MG/ML Outcome: tolerated well, no immediate complications Consent was given by the patient. Patient was prepped and draped in the usual sterile fashion.      Clinical Data: No additional findings.   Subjective: Chief Complaint  Patient presents with   Right Shoulder - Pain    Fall 07/20/2020   Neck - Pain    Fall 07/20/2020    HPI 75 year old black female comes in today with complaints of neck and right shoulder pain.  Patient has history of Parkinson's disease and tripped and fell on carpet.  She had multiple ED and urgent care visits.  Fell at home and not sure what happened.  Next day right side of neck and right shoulder are bothering her.  Denies any upper extremity numbness and tingling.  She went to the ED July 27, 2020 and July 25, 2020 Jul 20, 2020 in urgent care Jul 23, 2020.  Pain in the right shoulder when she raises her arm. Review of Systems No current complaints of cardiopulmonary GI/GU issues  Objective: Vital Signs: Ht 5\' 1"  (1.549 m)   Wt 160 lb (72.6 kg)   BMI 30.23 kg/m   Physical Exam HENT:     Nose: Nose normal.  Pulmonary:     Effort: No respiratory distress.  Musculoskeletal:     Comments: Cervical spine she has mild bilateral brachial plexus tenderness.  Exam right shoulder positive drop arm test.  Positive impingement test.  Neurological:     Mental Status: She is alert and oriented to person, place, and time.     Ortho Exam  Specialty Comments:  No specialty comments available.  Imaging: No results found.   PMFS History: Patient Active Problem List   Diagnosis Date Noted   Shoulder contusion 07/31/2020   Fall 07/31/2020   Concussion 07/31/2020   Anosmia 05/02/2020   Grief reaction 03/27/2020   Memory loss 12/13/2019   Food poisoning 09/11/2019   B12 deficiency 06/14/2019   Mild neurocognitive disorder, likely due to Parkinson's disease 02/09/2019   Vitamin D deficiency 01/26/2019   Sweating profusely 12/08/2018   Insomnia disorder 12/08/2018   Cerumen impaction 10/19/2018   Lumbar facet arthropathy 02/10/2018  Thrombocytosis 05/24/2017   Iron deficiency anemia 04/23/2017   Cough 05/12/2016   Pseudophakia of both eyes 10/09/2015   Abnormal CBC 02/05/2015   Edema 10/15/2014   S/P lumbar laminectomy 02/20/2014   Pre-ulcerative calluses 2014-02-19   Parkinson disease ([place]) 02-19-2014   Cold sore 10/17/2013   Low back pain 07/25/2013   Nuclear sclerosis 02/08/2013   Heel ulcer ([place]) 03/08/2012   Rash 12/04/2011   Neck pain on left side 08/06/2011   Presence of unspecified artificial knee joint 04/22/2011   Hyperglycemia 03/02/2011   Acquired spondylolisthesis 12/25/2010   Degeneration of lumbosacral intervertebral disc 12/25/2010   Arthritis of right knee 12/03/2010   Knee  pain, right 09/08/2010   OSA (obstructive sleep apnea) 02/14/2010   BRONCHITIS, ACUTE 04/05/2009   Vertigo 12/02/2007   Other abnormal glucose 12/02/2007   Otitis media 10/26/2007   Other malaise and fatigue 05/11/2007   Adjustment disorder with mixed anxiety and depressed mood 02/15/2007   GERD (gastroesophageal reflux disease) 02/15/2007   Osteoarthritis 02/15/2007   Postherpetic trigeminal neuralgia 01/26/2007   Herpes zoster 01/26/2007   Morbid obesity 01/26/2007   Migraine headaches 01/26/2007   Essential hypertension 01/26/2007   Asthma 01/26/2007   Urinary incontinence 01/26/2007   Past Medical History:  Diagnosis Date   Abnormal CBC 02/05/2015   [name] 12/16 new: normocytic anemia and thrombocytosis   Adjustment disorder with mixed anxiety and depressed mood 02/15/2007   Chronic  Chronic pain Grief, stress Effexor XR  Dad died in Feb 20, 2023   Asthma    Diverticulosis of colon (without mention of hemorrhage)    Esophageal stricture    Essential hypertension 01/26/2007   Chronic Verapamil    GERD (gastroesophageal reflux disease) 02/15/2007   Chronic     Heart murmur    Hyperglycemia 03/02/2011   Mild     Insomnia disorder 12/08/2018   10/20 Carbid/Lev dose was increased - c/o hard time falling asleep (6 am) getting up at 12 am, poor sleep. Try Temazepam 15-30 mg at 11-1:30 pm    Internal hemorrhoids without mention of complication    Iron deficiency anemia 04/23/2017   Mild neurocognitive disorder, likely due to Parkinson's disease 02/09/2019   OSA (obstructive sleep apnea) 02/14/2010   In lab study (May 2018): AHI 25. Only had 45 minutes of sleep secondary to frequent awakenings secondary to sleep apnea. autocpap 5-15 cm water.    Osteoarthritis    Parkinson disease ([place]) February 19, 2014   2015 2017  Primidone - d/c 2019 Parkinson's: [facility] IR. Dr Tat   Postherpetic trigeminal neuralgia 01/26/2007   Qualifier: Diagnosis of  By: Marca Ancona RMA, [name]     Shortness of breath dyspnea     with allergies   Thrombocytosis 05/24/2017   Urinary incontinence 01/26/2007   Chronic  10/17 Detrol LA    Vitamin D deficiency 01/26/2019    Family History  Problem Relation Age of Onset   Hypertension Mother    Diabetes type II Mother    Asthma Mother    Diabetes Mother    Dementia Mother    Prostate cancer Father    Mental illness Father        dementia   Suicidality Son    Stroke Brother        died in prison   Colon cancer Neg Hx    Rectal cancer Neg Hx     Past Surgical History:  Procedure Laterality Date   ABDOMINAL HYSTERECTOMY     BREAST CYST EXCISION Left 1980's  LUMBAR LAMINECTOMY/DECOMPRESSION MICRODISCECTOMY Bilateral 02/20/2014   Procedure: LUMBAR TWO THREE, LUMBAR THREE FOUR LUMBAR LAMINECTOMY/DECOMPRESSION MICRODISCECTOMY 2 LEVELS;  Surgeon: Eustace Moore, MD;  Location: [facility];  Service: Neurosurgery;  Laterality: Bilateral;   SPLENECTOMY     TIBIA FRACTURE SURGERY Right    TIBIA FRACTURE SURGERY Left    TOTAL KNEE ARTHROPLASTY Right 2013   Social History   Occupational History   Occupation: Retired    Comment: Network engineer, admin assist   Tobacco Use   Smoking status: Never   Smokeless tobacco: Never  Vaping Use   Vaping Use: Never used  Substance and Sexual Activity   Alcohol use: No    Alcohol/week: 2.0 standard drinks    Types: 2 Glasses of wine per week    Comment: wine on occ    Drug use: No   Sexual activity: Not Currently

## 2020-08-02 NOTE — Telephone Encounter (Signed)
Betsy from [facility] has called in regards to the pt.  Dr.James saw the pt for right shoulder and neck pain. Will be getting an MRI of the patient.  Also FYI: the recent falls are not related tot he Parkinson's disease; they believe the patient is having some blackout falls.  Want to request that the pt gets a cardiology consult, possible syncopal episodes.  Please advise.   [phone]

## 2020-08-06 DIAGNOSIS — R55 Syncope and collapse: Secondary | ICD-10-CM

## 2020-08-06 HISTORY — DX: Syncope and collapse: R55

## 2020-08-06 NOTE — Assessment & Plan Note (Signed)
Continue with vitamin D 

## 2020-08-06 NOTE — Telephone Encounter (Signed)
Cardiology referral was made.  Thanks

## 2020-08-06 NOTE — Assessment & Plan Note (Signed)
Remote syncope on 07/20/2020.  Possible recurrent.  Will obtain a cardiology consultation.  Go to ER if relapsed

## 2020-08-07 ENCOUNTER — Other Ambulatory Visit: Payer: Self-pay

## 2020-08-07 ENCOUNTER — Ambulatory Visit
Admission: RE | Admit: 2020-08-07 | Discharge: 2020-08-07 | Disposition: A | Discharge status: Home / Self Care | Payer: BC Managed Care – PPO | Source: Ambulatory Visit | Attending: Surgery | Admitting: Surgery

## 2020-08-07 DIAGNOSIS — Z961 Presence of intraocular lens: Secondary | ICD-10-CM | POA: Diagnosis not present

## 2020-08-07 DIAGNOSIS — M7541 Impingement syndrome of right shoulder: Secondary | ICD-10-CM

## 2020-08-07 DIAGNOSIS — M25511 Pain in right shoulder: Secondary | ICD-10-CM

## 2020-08-07 DIAGNOSIS — H0259 Other disorders affecting eyelid function: Secondary | ICD-10-CM | POA: Diagnosis not present

## 2020-08-07 IMAGING — MR MR SHOULDER*R* W/O CM
5 series · 35 of 40 positions shown · non-contrast
Comparison: None.

CLINICAL DATA: Right shoulder pain for months

EXAM:
MRI OF THE RIGHT SHOULDER WITHOUT CONTRAST
TECHNIQUE: Multiplanar, multisequence MR imaging of the shoulder was performed.
No intravenous contrast was administered.

[Series 10: T2 fat-sat · axial · 4.0mm · 0.55mm/px · z∈[-48,+61]mm · 8 of 24 slices shown (1 of 3)]
[im 1/24]
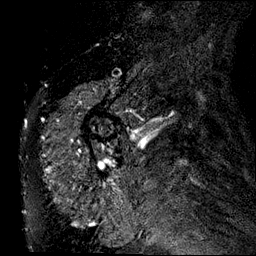
[im 3/24]
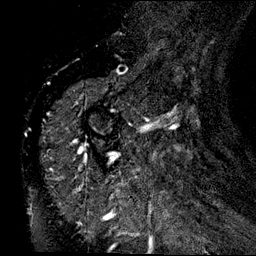
[im 8/24]
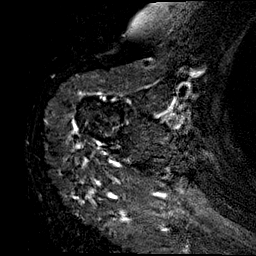
[im 11/24]
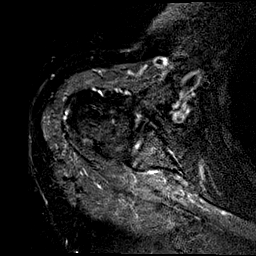
[im 13/24]
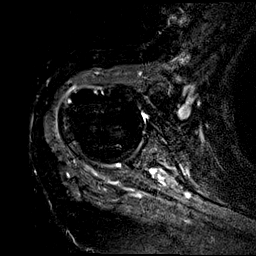
[im 16/24]
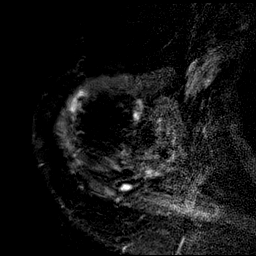
[im 21/24]
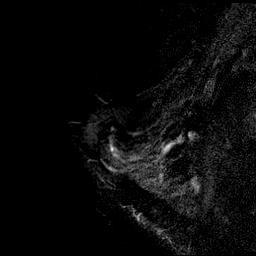
[im 24/24]
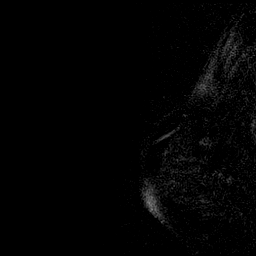

[Series 11: T2 fat-sat · oblique · 4.0mm · 0.59mm/px · 7 of 16 slices shown (2 of 3)]
[im 1/16]
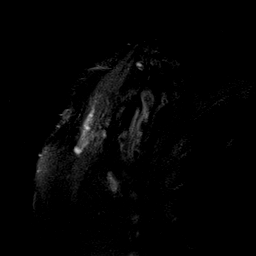
[im 3/16]
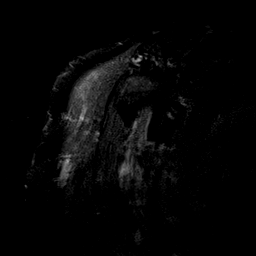
[im 6/16]
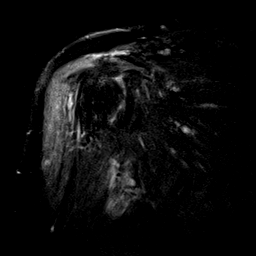
[im 8/16]
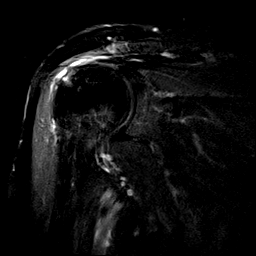
[im 11/16]
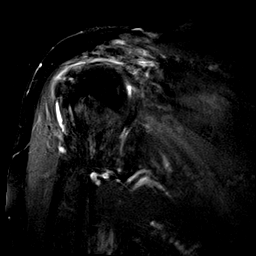
[im 13/16]
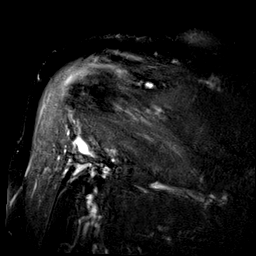
[im 16/16]
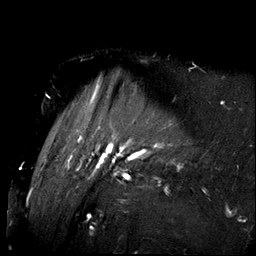

[Series 12: PD · oblique · 4.0mm · 0.29mm/px · 7 of 16 slices shown]
[im 1/16]
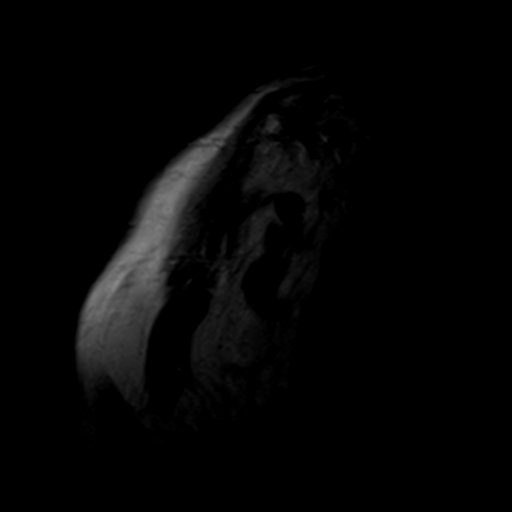
[im 3/16]
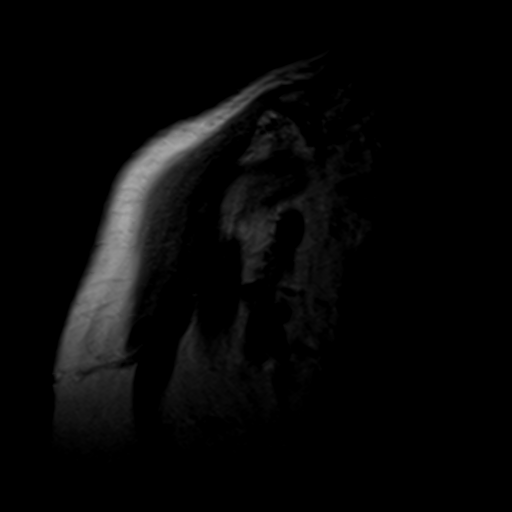
[im 6/16]
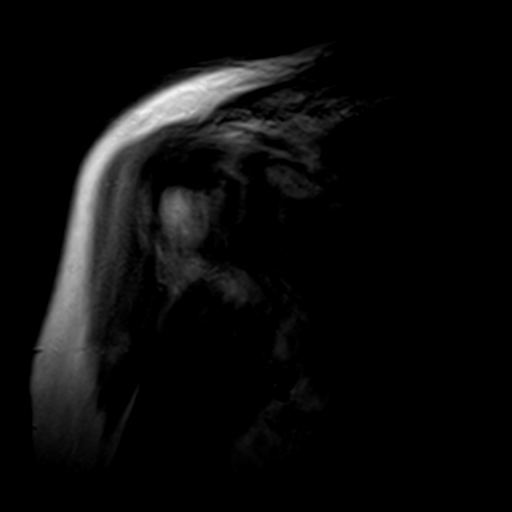
[im 8/16]
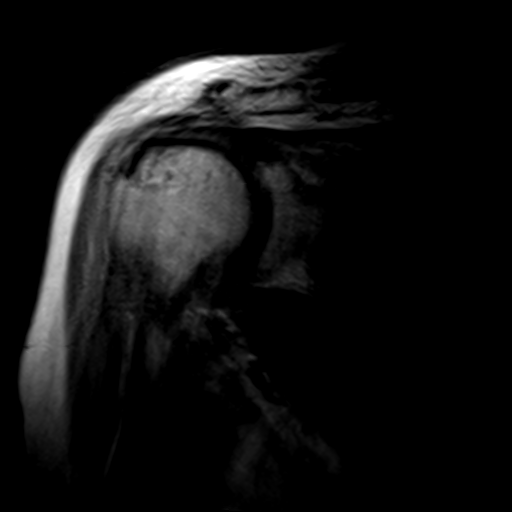
[im 11/16]
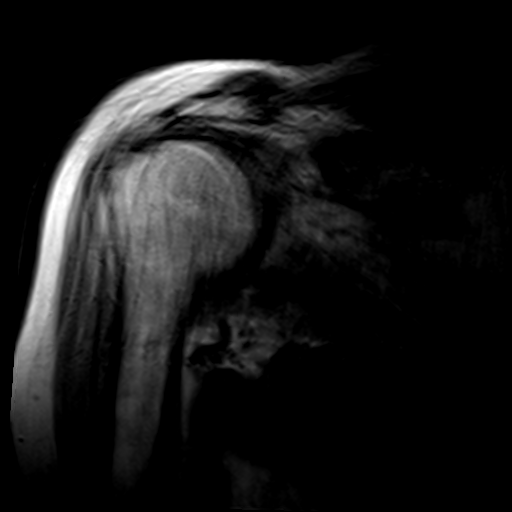
[im 13/16]
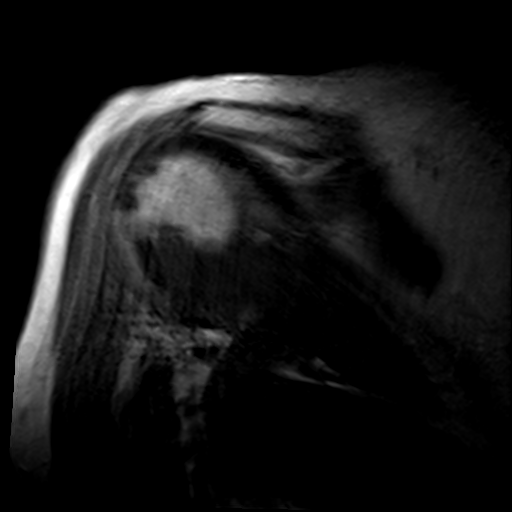
[im 16/16]
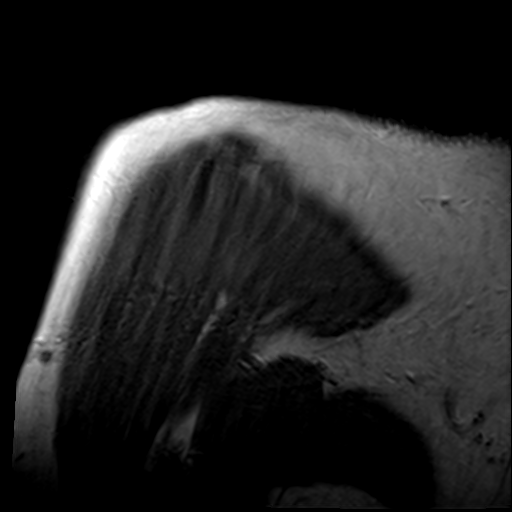

[Series 13: T2 fat-sat · oblique · 4.0mm · 0.59mm/px · 8 of 20 slices shown (3 of 3)]
[im 1/20]
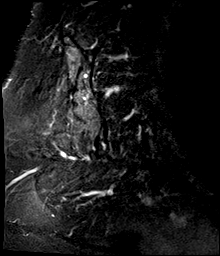
[im 3/20]
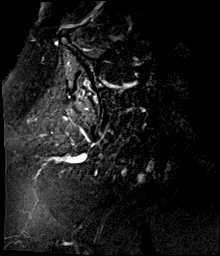
[im 6/20]
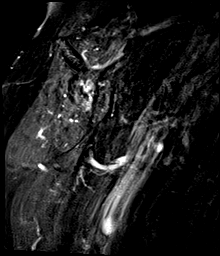
[im 9/20]
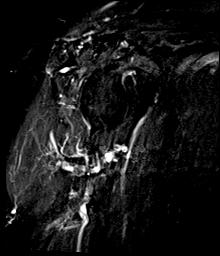
[im 11/20]
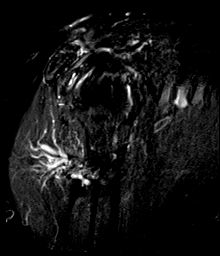
[im 14/20]
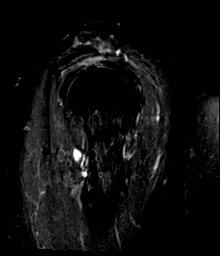
[im 17/20]
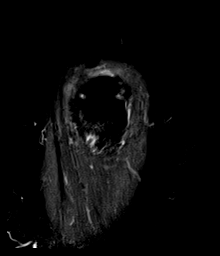
[im 20/20]
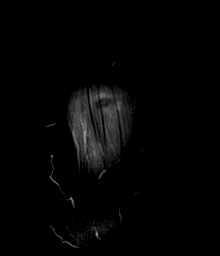

[Series 14: T1 · oblique · 4.0mm · 0.29mm/px · 5 of 20 slices shown]
[im 1/20]
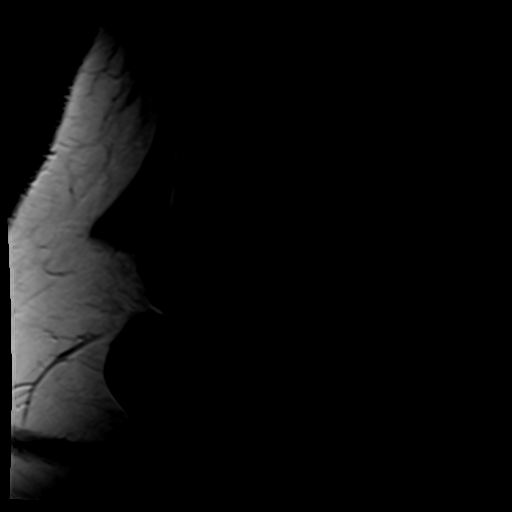
[im 3/20]
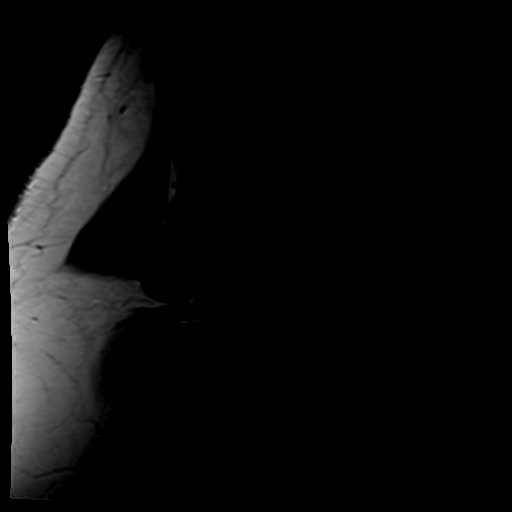
[im 6/20]
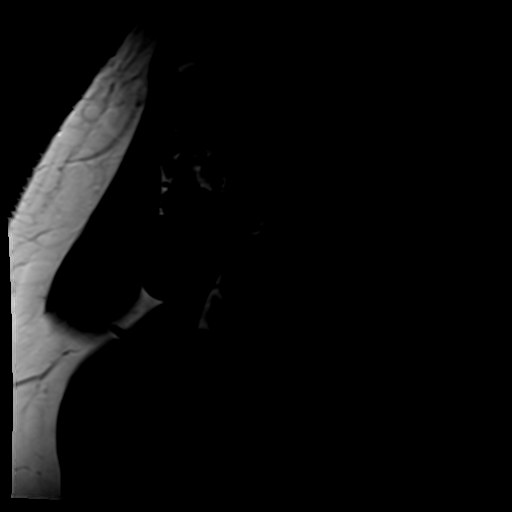
[im 9/20]
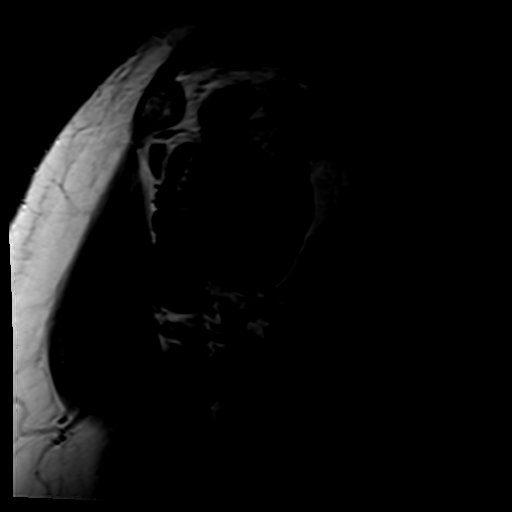
[im 11/20]
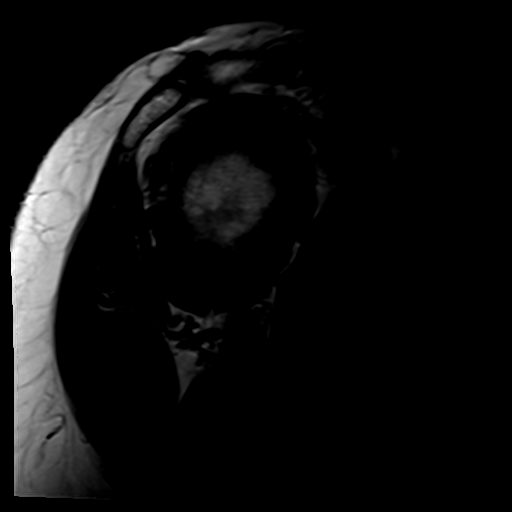

[35 of 40 positions shown; findings below may reference images not displayed]

FINDINGS: Patient motion degrades image quality limiting evaluation.

Rotator cuff: Complete tear of the supraspinatus tendon with 2.7 cm
of retraction. Moderate tendinosis of the infraspinatus tendon.
Teres minor tendon is intact. Subscapularis tendon is intact.

Muscles: No muscle atrophy or edema. No intramuscular fluid
collection or hematoma.

Biceps Long Head: Mild tendinosis of the intra-articular portion of
the long head of the biceps tendon.

Acromioclavicular Joint: Moderate arthropathy of the
acromioclavicular joint. Type II acromion. Trace
subacromial/subdeltoid bursal fluid.

Glenohumeral Joint: No joint effusion. No chondral defect.

Labrum: Grossly intact, but evaluation is limited by lack of
intraarticular fluid/contrast.

Bones: No fracture or dislocation. No aggressive osseous lesion.

Other: No fluid collection or hematoma.
IMPRESSION: 1. Complete tear of the supraspinatus tendon with 2.7 cm of
retraction.
2. Moderate tendinosis of the infraspinatus tendon.
3. Mild tendinosis of the intra-articular portion of the long head
of the biceps tendon.

## 2020-08-08 ENCOUNTER — Other Ambulatory Visit: Payer: BC Managed Care – PPO

## 2020-08-08 ENCOUNTER — Telehealth: Payer: Self-pay | Admitting: Surgery

## 2020-08-08 NOTE — Telephone Encounter (Signed)
Patient called asked if there is anyway she can get something called in for pain. Patient asked for a call back as soon as the Rx is sent to the pharmacy. The number to contact patient is [phone]

## 2020-08-08 NOTE — Telephone Encounter (Signed)
Please advise 

## 2020-08-08 NOTE — Telephone Encounter (Signed)
Pt called and states that she is in a lot of pain and her MRI got pushed back. She Is wondering if she can get some pain medication prescribed. [facility] would like [facility] to [facility] on [address].   CB [phone]

## 2020-08-09 ENCOUNTER — Telehealth: Payer: Self-pay | Admitting: Surgery

## 2020-08-09 NOTE — Telephone Encounter (Signed)
I called and lmom advised her of Jeneen Rinks' Message

## 2020-08-09 NOTE — Telephone Encounter (Signed)
Pt called stating she's in a lot of pain and it's so intense that she wasn't able to take x-rays. Pt would like something called in but she states she has tried oxycodone and that didn't help much with her pain.  [phone]

## 2020-08-12 ENCOUNTER — Telehealth: Payer: Self-pay | Admitting: Internal Medicine

## 2020-08-12 DIAGNOSIS — M159 Polyosteoarthritis, unspecified: Secondary | ICD-10-CM

## 2020-08-12 DIAGNOSIS — M8949 Other hypertrophic osteoarthropathy, multiple sites: Secondary | ICD-10-CM

## 2020-08-12 DIAGNOSIS — G8929 Other chronic pain: Secondary | ICD-10-CM

## 2020-08-12 MED ORDER — DIAZEPAM 5 MG PO TABS: ORAL_TABLET | ORAL | 0 refills | Status: DC

## 2020-08-12 NOTE — Telephone Encounter (Signed)
   Patient calling to request medication for pain in shoulder. Patient states she was in so much pain when trying to get MRI last week, the test had to be stopped. She is rescheduled for MRI 6/23  Requesting medication to help relax her for test.  Please call

## 2020-08-12 NOTE — Telephone Encounter (Signed)
Alvera should contact Dr Greta Doom, [facility], regarding an additional pain medication.  I will send a prescription for diazepam to take prior to the MRI.  Thanks,

## 2020-08-13 NOTE — Telephone Encounter (Signed)
Notified pt w/MD response.../lmb 

## 2020-08-15 ENCOUNTER — Ambulatory Visit
Admission: RE | Admit: 2020-08-15 | Discharge: 2020-08-15 | Disposition: A | Discharge status: Home / Self Care | Payer: BC Managed Care – PPO | Source: Ambulatory Visit | Attending: Surgery | Admitting: Surgery

## 2020-08-15 ENCOUNTER — Other Ambulatory Visit: Payer: Self-pay

## 2020-08-15 ENCOUNTER — Ambulatory Visit: Payer: BC Managed Care – PPO | Admitting: Surgery

## 2020-08-15 DIAGNOSIS — M25511 Pain in right shoulder: Secondary | ICD-10-CM | POA: Diagnosis not present

## 2020-08-21 ENCOUNTER — Other Ambulatory Visit: Payer: Self-pay

## 2020-08-21 ENCOUNTER — Ambulatory Visit (INDEPENDENT_AMBULATORY_CARE_PROVIDER_SITE_OTHER): Payer: Medicare Other | Admitting: Orthopaedic Surgery

## 2020-08-21 ENCOUNTER — Encounter: Payer: Self-pay | Admitting: Surgery

## 2020-08-21 DIAGNOSIS — M75121 Complete rotator cuff tear or rupture of right shoulder, not specified as traumatic: Secondary | ICD-10-CM | POA: Diagnosis not present

## 2020-08-22 DIAGNOSIS — M75121 Complete rotator cuff tear or rupture of right shoulder, not specified as traumatic: Secondary | ICD-10-CM | POA: Insufficient documentation

## 2020-08-22 NOTE — Progress Notes (Signed)
Office Visit Note   Patient: Mallory Klein           Date of Birth: 1946-01-25           MRN: [id number] Visit Date: 08/21/2020              Requested by: Cassandria Anger, MD [place],  [place] [id number] PCP: Plotnikov, Evie Lacks, MD   Assessment & Plan: Visit Diagnoses:  1. Complete tear of right rotator cuff, unspecified whether traumatic     Plan: Patient has full-thickness supraspinatus tear also some biceps tendon partial tearing intra-articularly.  With patient only using her left arm she is not able to get up on her own from a chair to use her walker.  She was falling using her walker with both hands and we discussed if she had surgical repair of the rotator cuff her arm would be in the sling while the tendon was repairing and she would have to have her husband help get her up going to the bathroom getting from sitting to standing and would be at increased fall risk.  Possibility of shoulder arthroscopy with rotator cuff debridement and biceps tendon arthroscopic release discussed.  This would give her partial pain relief but she would be able to use her arm some after the surgery in the short-term as tolerated by pain.  Complex issue due to her falling history and Parkinson's.  Considerable time was spent discussing this with patient and her husband.  I will recheck her in 2 weeks so we can discuss this further.  Follow-Up Instructions: Return in about 2 weeks (around 09/04/2020).   Orders:  No orders of the defined types were placed in this encounter.  No orders of the defined types were placed in this encounter.     Procedures: No procedures performed   Clinical Data: No additional findings.   Subjective: Chief Complaint  Patient presents with   Right Shoulder - Follow-up    HPI 75 year old female with Parkinson's with history repetitive falls is using a rolling walker with reverse seat.  She had pain in her shoulder failed treatment with  injection and MRI scan shows complete tear of the supraspinatus 2.7 cm retraction.  Tendinosis intra-articular portion of the long head of the biceps and moderate tendinosis of the infraspinatus.  Patient is used oxycodone occasionally.  She is here to see me for consideration of possible shoulder surgery.  Review of Systems possible Parkinson's with recurrent falls.  Lumbar spondylolisthesis.  Current right shoulder rotator cuff tear.   Objective: Vital Signs: BP (!) 145/90   Pulse 99   Ht 5\' 1"  (1.549 m)   Wt 160 lb (72.6 kg)   BMI 30.23 kg/m   Physical Exam Constitutional:      Appearance: She is well-developed.     Comments: Parkinson's type dyskinetic motion.  HENT:     Head: Normocephalic.     Right Ear: External ear normal.     Left Ear: External ear normal. There is no impacted cerumen.  Eyes:     Pupils: Pupils are equal, round, and reactive to light.  Neck:     Thyroid: No thyromegaly.     Trachea: No tracheal deviation.  Cardiovascular:     Rate and Rhythm: Normal rate.  Pulmonary:     Effort: Pulmonary effort is normal.  Abdominal:     Palpations: Abdomen is soft.  Musculoskeletal:     Cervical back: No rigidity.  Skin:  General: Skin is warm and dry.  Neurological:     Mental Status: She is alert and oriented to person, place, and time.  Psychiatric:        Behavior: Behavior normal.    Ortho Exam patient is able get her arm up overhead she has significant tenderness anteriorly over the biceps groove.  Moderate impingement.  Positive drop arm test.  Specialty Comments:  No specialty comments available.  Imaging: CLINICAL DATA:  Right shoulder pain for months   EXAM: MRI OF THE RIGHT SHOULDER WITHOUT CONTRAST   TECHNIQUE: Multiplanar, multisequence MR imaging of the shoulder was performed. No intravenous contrast was administered.   COMPARISON:  None.   FINDINGS: Patient motion degrades image quality limiting evaluation.   Rotator cuff:  Complete tear of the supraspinatus tendon with 2.7 cm of retraction. Moderate tendinosis of the infraspinatus tendon. Teres minor tendon is intact. Subscapularis tendon is intact.   Muscles: No muscle atrophy or edema. No intramuscular fluid collection or hematoma.   Biceps Long Head: Mild tendinosis of the intra-articular portion of the long head of the biceps tendon.   Acromioclavicular Joint: Moderate arthropathy of the acromioclavicular joint. Type II acromion. Trace subacromial/subdeltoid bursal fluid.   Glenohumeral Joint: No joint effusion. No chondral defect.   Labrum: Grossly intact, but evaluation is limited by lack of intraarticular fluid/contrast.   Bones: No fracture or dislocation. No aggressive osseous lesion.   Other: No fluid collection or hematoma.   IMPRESSION: 1. Complete tear of the supraspinatus tendon with 2.7 cm of retraction. 2. Moderate tendinosis of the infraspinatus tendon. 3. Mild tendinosis of the intra-articular portion of the long head of the biceps tendon.     Electronically Signed   By: Kathreen Devoid   On: 08/16/2020 07:06   PMFS History: Patient Active Problem List   Diagnosis Date Noted   Complete tear of right rotator cuff 08/22/2020   Syncope and collapse 08/06/2020   Shoulder contusion 07/31/2020   Fall 07/31/2020   Concussion 07/31/2020   Anosmia 05/02/2020   Grief reaction 03/27/2020   Memory loss 12/13/2019   Food poisoning 09/11/2019   B12 deficiency 06/14/2019   Mild neurocognitive disorder, likely due to Parkinson's disease 02/09/2019   Vitamin D deficiency 01/26/2019   Sweating profusely 12/08/2018   Insomnia disorder 12/08/2018   Cerumen impaction 10/19/2018   Lumbar facet arthropathy 02/10/2018   Thrombocytosis 05/24/2017   Iron deficiency anemia 04/23/2017   Cough 05/12/2016   Pseudophakia of both eyes 10/09/2015   Abnormal CBC 02/05/2015   Edema 10/15/2014   S/P lumbar laminectomy 02/20/2014    Pre-ulcerative calluses 02/09/2014   Parkinson disease ([place]) 02/09/2014   Cold sore 10/17/2013   Low back pain 07/25/2013   Nuclear sclerosis 02/08/2013   Heel ulcer ([place]) 03/08/2012   Rash 12/04/2011   Neck pain on left side 08/06/2011   Presence of unspecified artificial knee joint 04/22/2011   Hyperglycemia 03/02/2011   Acquired spondylolisthesis 12/25/2010   Degeneration of lumbosacral intervertebral disc 12/25/2010   Arthritis of right knee 12/03/2010   Knee pain, right 09/08/2010   OSA (obstructive sleep apnea) 02/14/2010   BRONCHITIS, ACUTE 04/05/2009   Vertigo 12/02/2007   Other abnormal glucose 12/02/2007   Otitis media 10/26/2007   Other malaise and fatigue 05/11/2007   Adjustment disorder with mixed anxiety and depressed mood 02/15/2007   GERD (gastroesophageal reflux disease) 02/15/2007   Osteoarthritis 02/15/2007   Postherpetic trigeminal neuralgia 01/26/2007   Herpes zoster 01/26/2007   Morbid obesity 01/26/2007  Migraine headaches 01/26/2007   Essential hypertension 01/26/2007   Asthma 01/26/2007   Urinary incontinence 01/26/2007   Past Medical History:  Diagnosis Date   Abnormal CBC 02/05/2015   [name] 12/16 new: normocytic anemia and thrombocytosis   Adjustment disorder with mixed anxiety and depressed mood 02/15/2007   Chronic  Chronic pain Grief, stress Effexor XR  Dad died in Feb 16, 2023   Asthma    Diverticulosis of colon (without mention of hemorrhage)    Esophageal stricture    Essential hypertension 01/26/2007   Chronic Verapamil    GERD (gastroesophageal reflux disease) 02/15/2007   Chronic     Heart murmur    Hyperglycemia 03/02/2011   Mild     Insomnia disorder 12/08/2018   10/20 Carbid/Lev dose was increased - c/o hard time falling asleep (6 am) getting up at 12 am, poor sleep. Try Temazepam 15-30 mg at 11-1:30 pm    Internal hemorrhoids without mention of complication    Iron deficiency anemia 04/23/2017   Mild neurocognitive disorder, likely due  to Parkinson's disease 02/09/2019   OSA (obstructive sleep apnea) 02/14/2010   In lab study (May 2018): AHI 25. Only had 45 minutes of sleep secondary to frequent awakenings secondary to sleep apnea. autocpap 5-15 cm water.    Osteoarthritis    Parkinson disease ([place]) 02/15/14   2015 2017  Primidone - d/c 2019 Parkinson's: Sinmet IR. Dr Tat   Postherpetic trigeminal neuralgia 01/26/2007   Qualifier: Diagnosis of  By: Marca Ancona RMA, [name]     Shortness of breath dyspnea    with allergies   Thrombocytosis 05/24/2017   Urinary incontinence 01/26/2007   Chronic  10/17 Detrol LA    Vitamin D deficiency 01/26/2019    Family History  Problem Relation Age of Onset   Hypertension Mother    Diabetes type II Mother    Asthma Mother    Diabetes Mother    Dementia Mother    Prostate cancer Father    Mental illness Father        dementia   Suicidality Son    Stroke Brother        died in prison   Colon cancer Neg Hx    Rectal cancer Neg Hx     Past Surgical History:  Procedure Laterality Date   ABDOMINAL HYSTERECTOMY     BREAST CYST EXCISION Left 1980's   LUMBAR LAMINECTOMY/DECOMPRESSION MICRODISCECTOMY Bilateral 02/20/2014   Procedure: LUMBAR TWO THREE, LUMBAR THREE FOUR LUMBAR LAMINECTOMY/DECOMPRESSION MICRODISCECTOMY 2 LEVELS;  Surgeon: Eustace Moore, MD;  Location: [facility];  Service: Neurosurgery;  Laterality: Bilateral;   SPLENECTOMY     TIBIA FRACTURE SURGERY Right    TIBIA FRACTURE SURGERY Left    TOTAL KNEE ARTHROPLASTY Right 2013   Social History   Occupational History   Occupation: Retired    Comment: Network engineer, admin assist   Tobacco Use   Smoking status: Never   Smokeless tobacco: Never  Vaping Use   Vaping Use: Never used  Substance and Sexual Activity   Alcohol use: No    Alcohol/week: 2.0 standard drinks    Types: 2 Glasses of wine per week    Comment: wine on occ    Drug use: No   Sexual activity: Not Currently

## 2020-08-29 DIAGNOSIS — M1712 Unilateral primary osteoarthritis, left knee: Secondary | ICD-10-CM | POA: Diagnosis not present

## 2020-08-29 DIAGNOSIS — G894 Chronic pain syndrome: Secondary | ICD-10-CM | POA: Diagnosis not present

## 2020-08-29 DIAGNOSIS — M47816 Spondylosis without myelopathy or radiculopathy, lumbar region: Secondary | ICD-10-CM | POA: Diagnosis not present

## 2020-08-29 DIAGNOSIS — M48061 Spinal stenosis, lumbar region without neurogenic claudication: Secondary | ICD-10-CM | POA: Diagnosis not present

## 2020-09-06 ENCOUNTER — Ambulatory Visit (INDEPENDENT_AMBULATORY_CARE_PROVIDER_SITE_OTHER): Payer: Medicare Other | Admitting: Orthopaedic Surgery

## 2020-09-06 ENCOUNTER — Other Ambulatory Visit: Payer: Self-pay

## 2020-09-06 VITALS — BP 143/83 | HR 99

## 2020-09-06 DIAGNOSIS — M75121 Complete rotator cuff tear or rupture of right shoulder, not specified as traumatic: Secondary | ICD-10-CM | POA: Diagnosis not present

## 2020-09-06 NOTE — Progress Notes (Signed)
Office Visit Note   Patient: Mallory Klein           Date of Birth: 23-Dec-1945           MRN: [id number] Visit Date: 09/06/2020              Requested by: Cassandria Anger, MD [place],   [id number] PCP: Plotnikov, Evie Lacks, MD   Assessment & Plan: Visit Diagnoses:  1. Complete tear of right rotator cuff, unspecified whether traumatic     Plan: Patient's pain is doing better at this point.  We will follow-up with her if she has increased symptoms.  Currently her symptoms are not severe enough to consider operative intervention.  Follow-Up Instructions: Return if symptoms worsen or fail to improve.   Orders:  No orders of the defined types were placed in this encounter.  No orders of the defined types were placed in this encounter.     Procedures: No procedures performed   Clinical Data: No additional findings.   Subjective: Chief Complaint  Patient presents with   Right Shoulder - Follow-up    HPI 75 year old female returns with ongoing problems with right shoulder pain.  She states the last few weeks her shoulder is actually been doing much better.  She currently is on buprenorphine patch 7.5 mcg/h strength.  She also has been on hydrocodone 10/325 60tablets with 15-day supply.  PDMP reviewed .  She is able to use her arm better and moving better.  Patient is here with her husband.  MRI scan 08/15/2020 showed complete tear of the right supraspinatus tendon consistent with her supraspinatus discomfort with activity.  Mild anterior articular tendinosis of the long of the biceps tendon.  Review of Systems 14 point system update unchanged from 08/21/2020.  Of note is Parkinson's with recurrent falls lumbar spondylolisthesis and the right rotator cuff tear.  Patient uses a rolling walker with reverse seat chronically.   Objective: Vital Signs: BP (!) 143/83   Pulse 99   Physical Exam Constitutional:      Appearance: She is well-developed.   HENT:     Head: Normocephalic.     Right Ear: External ear normal.     Left Ear: External ear normal. There is no impacted cerumen.  Eyes:     Pupils: Pupils are equal, round, and reactive to light.  Neck:     Thyroid: No thyromegaly.     Trachea: No tracheal deviation.  Cardiovascular:     Rate and Rhythm: Normal rate.  Pulmonary:     Effort: Pulmonary effort is normal.  Abdominal:     Palpations: Abdomen is soft.  Musculoskeletal:     Cervical back: No rigidity.  Skin:    General: Skin is warm and dry.  Neurological:     Mental Status: She is alert and oriented to person, place, and time.  Psychiatric:        Behavior: Behavior normal.    Ortho Exam some pain and weakness with resisted supraspinatus testing on the right normal on the left.  Moderate impingement the right.  Long head of the biceps is tender anteriorly.  She can reach out in front of her as well as overhead with mild discomfort.  Specialty Comments:  No specialty comments available.  Imaging: No results found.   PMFS History: Patient Active Problem List   Diagnosis Date Noted   Complete tear of right rotator cuff 08/22/2020   Syncope and collapse 08/06/2020  Shoulder contusion 07/31/2020   Fall 07/31/2020   Concussion 07/31/2020   Anosmia 05/02/2020   Grief reaction 03/27/2020   Memory loss 12/13/2019   Food poisoning 09/11/2019   B12 deficiency 06/14/2019   Mild neurocognitive disorder, likely due to Parkinson's disease 02/09/2019   Vitamin D deficiency 01/26/2019   Sweating profusely 12/08/2018   Insomnia disorder 12/08/2018   Cerumen impaction 10/19/2018   Lumbar facet arthropathy 02/10/2018   Thrombocytosis 05/24/2017   Iron deficiency anemia 04/23/2017   Cough 05/12/2016   Pseudophakia of both eyes 10/09/2015   Abnormal CBC 02/05/2015   Edema 10/15/2014   S/P lumbar laminectomy 02/20/2014   Pre-ulcerative calluses 2014-02-21   Parkinson disease ([place]) 02/21/2014   Cold sore  10/17/2013   Low back pain 07/25/2013   Nuclear sclerosis 02/08/2013   Heel ulcer ([place]) 03/08/2012   Rash 12/04/2011   Neck pain on left side 08/06/2011   Presence of unspecified artificial knee joint 04/22/2011   Hyperglycemia 03/02/2011   Acquired spondylolisthesis 12/25/2010   Degeneration of lumbosacral intervertebral disc 12/25/2010   Arthritis of right knee 12/03/2010   Knee pain, right 09/08/2010   OSA (obstructive sleep apnea) 02/14/2010   BRONCHITIS, ACUTE 04/05/2009   Vertigo 12/02/2007   Other abnormal glucose 12/02/2007   Otitis media 10/26/2007   Other malaise and fatigue 05/11/2007   Adjustment disorder with mixed anxiety and depressed mood 02/15/2007   GERD (gastroesophageal reflux disease) 02/15/2007   Osteoarthritis 02/15/2007   Postherpetic trigeminal neuralgia 01/26/2007   Herpes zoster 01/26/2007   Morbid obesity 01/26/2007   Migraine headaches 01/26/2007   Essential hypertension 01/26/2007   Asthma 01/26/2007   Urinary incontinence 01/26/2007   Past Medical History:  Diagnosis Date   Abnormal CBC 02/05/2015   [name] 12/16 new: normocytic anemia and thrombocytosis   Adjustment disorder with mixed anxiety and depressed mood 02/15/2007   Chronic  Chronic pain Grief, stress Effexor XR  Dad died in 2023-02-22   Asthma    Diverticulosis of colon (without mention of hemorrhage)    Esophageal stricture    Essential hypertension 01/26/2007   Chronic Verapamil    GERD (gastroesophageal reflux disease) 02/15/2007   Chronic     Heart murmur    Hyperglycemia 03/02/2011   Mild     Insomnia disorder 12/08/2018   10/20 Carbid/Lev dose was increased - c/o hard time falling asleep (6 am) getting up at 12 am, poor sleep. Try Temazepam 15-30 mg at 11-1:30 pm    Internal hemorrhoids without mention of complication    Iron deficiency anemia 04/23/2017   Mild neurocognitive disorder, likely due to Parkinson's disease 02/09/2019   OSA (obstructive sleep apnea) 02/14/2010   In  lab study (May 2018): AHI 25. Only had 45 minutes of sleep secondary to frequent awakenings secondary to sleep apnea. autocpap 5-15 cm water.    Osteoarthritis    Parkinson disease ([place]) Feb 21, 2014   2015 2017  Primidone - d/c 2019 Parkinson's: Sinmet IR. Dr Tat   Postherpetic trigeminal neuralgia 01/26/2007   Qualifier: Diagnosis of  By: Marca Ancona RMA, [name]     Shortness of breath dyspnea    with allergies   Thrombocytosis 05/24/2017   Urinary incontinence 01/26/2007   Chronic  10/17 Detrol LA    Vitamin D deficiency 01/26/2019    Family History  Problem Relation Age of Onset   Hypertension Mother    Diabetes type II Mother    Asthma Mother    Diabetes Mother    Dementia Mother  Prostate cancer Father    Mental illness Father        dementia   Suicidality Son    Stroke Brother        died in prison   Colon cancer Neg Hx    Rectal cancer Neg Hx     Past Surgical History:  Procedure Laterality Date   ABDOMINAL HYSTERECTOMY     BREAST CYST EXCISION Left 1980's   LUMBAR LAMINECTOMY/DECOMPRESSION MICRODISCECTOMY Bilateral 02/20/2014   Procedure: LUMBAR TWO THREE, LUMBAR THREE FOUR LUMBAR LAMINECTOMY/DECOMPRESSION MICRODISCECTOMY 2 LEVELS;  Surgeon: Eustace Moore, MD;  Location: [facility];  Service: Neurosurgery;  Laterality: Bilateral;   SPLENECTOMY     TIBIA FRACTURE SURGERY Right    TIBIA FRACTURE SURGERY Left    TOTAL KNEE ARTHROPLASTY Right 2013   Social History   Occupational History   Occupation: Retired    Comment: Network engineer, admin assist   Tobacco Use   Smoking status: Never   Smokeless tobacco: Never  Vaping Use   Vaping Use: Never used  Substance and Sexual Activity   Alcohol use: No    Alcohol/week: 2.0 standard drinks    Types: 2 Glasses of wine per week    Comment: wine on occ    Drug use: No   Sexual activity: Not Currently

## 2020-09-09 ENCOUNTER — Encounter: Payer: Self-pay | Admitting: Hematology

## 2020-09-11 ENCOUNTER — Other Ambulatory Visit: Payer: Self-pay

## 2020-09-11 ENCOUNTER — Encounter: Payer: Self-pay | Admitting: Podiatry

## 2020-09-11 ENCOUNTER — Ambulatory Visit (INDEPENDENT_AMBULATORY_CARE_PROVIDER_SITE_OTHER): Payer: Medicare Other | Admitting: Podiatry

## 2020-09-11 ENCOUNTER — Encounter: Payer: Self-pay | Admitting: Hematology

## 2020-09-11 DIAGNOSIS — M79675 Pain in left toe(s): Secondary | ICD-10-CM | POA: Diagnosis not present

## 2020-09-11 DIAGNOSIS — M79674 Pain in right toe(s): Secondary | ICD-10-CM | POA: Diagnosis not present

## 2020-09-11 DIAGNOSIS — B351 Tinea unguium: Secondary | ICD-10-CM

## 2020-09-14 NOTE — Progress Notes (Signed)
Subjective: Mallory Klein is a pleasant 75 y.o. female patient seen today painful thick toenails that are difficult to trim. Pain interferes with ambulation. Aggravating factors include wearing enclosed shoe gear. Pain is relieved with periodic professional debridement.  PCP is Plotnikov, Evie Lacks, MD. Last visit was: 07/31/2020. She has h/o Parkinson's Disease. Her husband is present during today's visit.   Allergies  Allergen Reactions   Ace Inhibitors     Patient doesn't recall reaction.  jkl   Benicar [Olmesartan]     It made her sick   Aspirin Other (See Comments)    bruising   Citalopram Hydrobromide Diarrhea    Objective: Physical Exam  General: Mallory Klein is a pleasant 75 y.o. African American female, WD, WN in NAD. AAO x 3.   Vascular:  Capillary refill time to digits immediate b/l. Palpable DP pulse(s) b/l lower extremities Palpable PT pulse(s) b/l lower extremities Pedal hair sparse. Lower extremity skin temperature gradient within normal limits. No pain with calf compression b/l. No edema noted b/l lower extremities.  Dermatological:  Pedal skin with normal turgor, texture and tone b/l lower extremities. No open wounds b/l lower extremities. No interdigital macerations b/l lower extremities. Toenails 1-5 b/l elongated, discolored, dystrophic, thickened, crumbly with subungual debris and tenderness to dorsal palpation.  Musculoskeletal:  Normal muscle strength 5/5 to all lower extremity muscle groups bilaterally. No pain crepitus or joint limitation noted with ROM b/l. Hallux valgus with bunion deformity noted b/l lower extremities. Hammertoe(s) noted to the b/l lower extremities. Pes planus deformity noted b/l.   Neurological:  Protective sensation intact 5/5 intact bilaterally with 10g monofilament b/l. Vibratory sensation intact b/l. Involuntary tremors RLE.  Assessment and Plan:  1. Pain due to onychomycosis of toenails of both feet    -Examined  patient. -Patient to continue soft, supportive shoe gear daily. -Toenails 1-5 b/l were debrided in length and girth with sterile nail nippers and dremel without iatrogenic bleeding.  -Patient to report any pedal injuries to medical professional immediately. -Patient/POA to call should there be question/concern in the interim.  Return in about 3 months (around 12/12/2020).  Marzetta Board, DPM

## 2020-09-26 ENCOUNTER — Telehealth: Payer: Self-pay | Admitting: Nurse Practitioner

## 2020-09-26 DIAGNOSIS — M47816 Spondylosis without myelopathy or radiculopathy, lumbar region: Secondary | ICD-10-CM | POA: Diagnosis not present

## 2020-09-26 DIAGNOSIS — G894 Chronic pain syndrome: Secondary | ICD-10-CM | POA: Diagnosis not present

## 2020-09-26 DIAGNOSIS — M1712 Unilateral primary osteoarthritis, left knee: Secondary | ICD-10-CM | POA: Diagnosis not present

## 2020-09-26 DIAGNOSIS — M48061 Spinal stenosis, lumbar region without neurogenic claudication: Secondary | ICD-10-CM | POA: Diagnosis not present

## 2020-09-26 NOTE — Telephone Encounter (Addendum)
Inbound call from patient requesting a call from a nurse please.  Has been experiencing constipation for the past week.  Informed patient next availability will be in September with an APP but she is requesting something sooner.  Please advise.

## 2020-09-26 NOTE — Telephone Encounter (Signed)
Tried to call the patient back. "Smart blocker" is blocking my return call.

## 2020-09-26 NOTE — Telephone Encounter (Signed)
Patient calls back. She has not had an emptying bowel movement in a week. She feels she began to having worsening constipation about 2 weeks ago. She has used a saline enema which made her a little more comfortable. She had been taking Miralax QOD until 3 days ago when she began taking a daily dose. She is passing stool in small amounts. She does not have nausea, abdominal pain or fever. The patient is agreeable to following the plan she was given at her last office visit. Patient will --Make an effort to increase water intake. --Continue two stool softeners at night. --Slow bowel purge with a capful of Miralax in 8 oz of water TID. Once bowels start moving she can decrease dose to once daily. Patient expresses understanding of this plan. Is this okay?

## 2020-10-02 ENCOUNTER — Other Ambulatory Visit: Payer: Self-pay | Admitting: Internal Medicine

## 2020-10-04 DIAGNOSIS — H0259 Other disorders affecting eyelid function: Secondary | ICD-10-CM | POA: Diagnosis not present

## 2020-10-04 DIAGNOSIS — H0289 Other specified disorders of eyelid: Secondary | ICD-10-CM | POA: Diagnosis not present

## 2020-10-14 ENCOUNTER — Ambulatory Visit (INDEPENDENT_AMBULATORY_CARE_PROVIDER_SITE_OTHER): Payer: Medicare Other | Admitting: Physician Assistant

## 2020-10-14 ENCOUNTER — Encounter: Payer: Self-pay | Admitting: Physician Assistant

## 2020-10-14 VITALS — BP 120/68 | HR 88 | Ht 61.0 in | Wt 159.0 lb

## 2020-10-14 DIAGNOSIS — Z86010 Personal history of colonic polyps: Secondary | ICD-10-CM

## 2020-10-14 DIAGNOSIS — K5909 Other constipation: Secondary | ICD-10-CM | POA: Diagnosis not present

## 2020-10-14 NOTE — Patient Instructions (Signed)
We have given you samples of the following medication to take: Linzess 72 mcg every morning.   Call back with an update in 1 weeks, ask for [facility].   If you are age 75 or older, your body mass index should be between 23-30. Your Body mass index is 30.04 kg/m. If this is out of the aforementioned range listed, please consider follow up with your Primary Care Provider.  If you are age 40 or younger, your body mass index should be between 19-25. Your Body mass index is 30.04 kg/m. If this is out of the aformentioned range listed, please consider follow up with your Primary Care Provider.   __________________________________________________________  The [facility] providers would like to encourage you to use [facility] to communicate with providers for non-urgent requests or questions.  Due to long hold times on the telephone, sending your provider a message by [facility] may be a faster and more efficient way to get a response.  Please allow 48 business hours for a response.  Please remember that this is for non-urgent requests.

## 2020-10-14 NOTE — Progress Notes (Signed)
Noted  

## 2020-10-14 NOTE — Progress Notes (Signed)
Chief Complaint: Constipation  HPI:    Mallory Klein is a 75 year old African-American female with past medical history as listed below including GERD, Parkinson's and osteoarthritis, assigned to Dr. Henrene Pastor, who presents to clinic today for follow-up of her constipation.    03/2018 colonoscopy with 4 to-8 mm polyps at the rectosigmoid colon, in the descending colon and in the ascending colon, diverticulosis in the left colon, internal hemorrhoids and otherwise normal.  Pathology showed tubular adenomas.  Repeat recommended in 3 years.    01/10/2020 patient seen in clinic for constipation.  At that time noted she had Parkinson's disease and was on Sinemet.  Also on iron, calcium channel blocker and a pain patch.  She was using stool softeners at that time which were not working well.  At that time recommended making an effort to increase water, continue stools softeners at night and do a slow bowel purge with a capful of MiraLAX in water 3 times daily, then decrease to daily dose.    09/26/2020 patient called and described she had not had an emptying bowel movement in a week.  Has been worse over the past 2 weeks.  She did use a saline enema which helped slightly.  She been taking MiraLAX every other day until 3 days ago when she started taking it daily.    Today, the patient tells me that she had been down to taking MiraLAX maybe once every 3 days, but then she did not have a bowel movement for 5 days over the past few weeks and started taking it once daily for the past week.  She tells me even with taking this medication once daily it seems to be "a lot of effort", to get out a stool which she is able to pass daily with the addition of MiraLAX.  Tells me she maybe wants to try something else.  Patient is used to having daily stools and if she goes any longer than a day or 2 she starts feeling bad.  Associated symptoms include some abdominal discomfort, distention and a "yucky feeling".    Denies fever,  chills, blood in her stool, vomiting or symptoms that awaken her from sleep.  Past Medical History:  Diagnosis Date   Abnormal CBC 02/05/2015   [name] 12/16 new: normocytic anemia and thrombocytosis   Adjustment disorder with mixed anxiety and depressed mood 02/15/2007   Chronic  Chronic pain Grief, stress Effexor XR  Dad died in 02-22-2023   Asthma    Diverticulosis of colon (without mention of hemorrhage)    Esophageal stricture    Essential hypertension 01/26/2007   Chronic Verapamil    GERD (gastroesophageal reflux disease) 02/15/2007   Chronic     Heart murmur    Hyperglycemia 03/02/2011   Mild     Insomnia disorder 12/08/2018   10/20 Carbid/Lev dose was increased - c/o hard time falling asleep (6 am) getting up at 12 am, poor sleep. Try Temazepam 15-30 mg at 11-1:30 pm    Internal hemorrhoids without mention of complication    Iron deficiency anemia 04/23/2017   Mild neurocognitive disorder, likely due to Parkinson's disease 02/09/2019   OSA (obstructive sleep apnea) 02/14/2010   In lab study (May 2018): AHI 25. Only had 45 minutes of sleep secondary to frequent awakenings secondary to sleep apnea. autocpap 5-15 cm water.    Osteoarthritis    Parkinson disease ([place]) 21-Feb-2014   2015 2017  Primidone - d/c 2019 Parkinson's: Sinmet IR. Dr [name]  Postherpetic trigeminal neuralgia 01/26/2007   Qualifier: Diagnosis of  By: Marca Ancona RMA, [name]     Shortness of breath dyspnea    with allergies   Thrombocytosis 05/24/2017   Urinary incontinence 01/26/2007   Chronic  10/17 Detrol LA    Vitamin D deficiency 01/26/2019    Past Surgical History:  Procedure Laterality Date   ABDOMINAL HYSTERECTOMY     BREAST CYST EXCISION Left 1980's   LUMBAR LAMINECTOMY/DECOMPRESSION MICRODISCECTOMY Bilateral 02/20/2014   Procedure: LUMBAR TWO THREE, LUMBAR THREE FOUR LUMBAR LAMINECTOMY/DECOMPRESSION MICRODISCECTOMY 2 LEVELS;  Surgeon: Eustace Moore, MD;  Location: [facility];  Service: Neurosurgery;  Laterality:  Bilateral;   SPLENECTOMY     TIBIA FRACTURE SURGERY Right    TIBIA FRACTURE SURGERY Left    TOTAL KNEE ARTHROPLASTY Right 2013    Current Outpatient Medications  Medication Sig Dispense Refill   buprenorphine (BUTRANS) 7.5 MCG/HR 1 patch once a week.     celecoxib (CELEBREX) 200 MG capsule Take 1 capsule (200 mg total) by mouth 2 (two) times daily as needed for moderate pain. 60 capsule 3   cetirizine (ZYRTEC) 10 MG tablet Take 1 tablet (10 mg total) by mouth daily. 90 tablet 3   Cholecalciferol 25 MCG (1000 UT) tablet Take by mouth.     cyanocobalamin (,VITAMIN B-12,) 1000 MCG/ML injection INJECT 1 ML INTO THE SKIN EVERY 14 DAYS 10 mL 3   diclofenac Sodium (VOLTAREN) 1 % GEL Apply 4 g topically daily as needed (pain).     docusate sodium (COLACE) 100 MG capsule Take 100 mg by mouth at bedtime.     hydrOXYzine (VISTARIL) 25 MG capsule Take 25 mg by mouth once.     montelukast (SINGULAIR) 10 MG tablet Take 1 tablet (10 mg total) by mouth daily. 90 tablet 3   OVER THE COUNTER MEDICATION Take 1 tablet by mouth at bedtime. Lions mane extract     pantoprazole (PROTONIX) 40 MG tablet Take 1 tablet (40 mg total) by mouth daily. 30 tablet 11   polyethylene glycol (MIRALAX / GLYCOLAX) 17 g packet Take 17 g by mouth daily as needed for mild constipation.     verapamil (VERELAN PM) 120 MG 24 hr capsule Take 1 capsule (120 mg total) by mouth at bedtime. 30 capsule 11   zolpidem (AMBIEN) 10 MG tablet TAKE 1/2 TO 1 TABLET BY  MOUTH AT BEDTIME AS NEEDED  FOR SLEEP 90 tablet 1   No current facility-administered medications for this visit.    Allergies as of 10/14/2020 - Review Complete 09/11/2020  Allergen Reaction Noted   Ace inhibitors     Benicar [olmesartan]  07/28/2018   Aspirin Other (See Comments)    Citalopram hydrobromide Diarrhea 08/06/2008    Family History  Problem Relation Age of Onset   Hypertension Mother    Diabetes type II Mother    Asthma Mother    Diabetes Mother     Dementia Mother    Prostate cancer Father    Mental illness Father        dementia   Suicidality Son    Stroke Brother        died in prison   Colon cancer Neg Hx    Rectal cancer Neg Hx     Social History   Socioeconomic History   Marital status: Married    Spouse name: Not on file   Number of children: 2   Years of education: 14   Highest education level: Associate degree: occupational,  technical, or vocational program  Occupational History   Occupation: Retired    Comment: Network engineer, Youth worker   Tobacco Use   Smoking status: Never   Smokeless tobacco: Never  Vaping Use   Vaping Use: Never used  Substance and Sexual Activity   Alcohol use: No    Alcohol/week: 2.0 standard drinks    Types: 2 Glasses of wine per week    Comment: wine on occ    Drug use: No   Sexual activity: Not Currently  Other Topics Concern   Not on file  Social History Narrative   Drink tea daily and coffee on occ   Right handed   1 story    Lives with spouse   Social Determinants of Health   Financial Resource Strain: Low Risk    Difficulty of Paying Living Expenses: Not hard at all  Food Insecurity: No Food Insecurity   Worried About Charity fundraiser in the Last Year: Never true   [place] in the Last Year: Never true  Transportation Needs: No Transportation Needs   Lack of Transportation (Medical): No   Lack of Transportation (Non-Medical): No  Physical Activity: Inactive   Days of Exercise per Week: 0 days   Minutes of Exercise per Session: 0 min  Stress: No Stress Concern Present   Feeling of Stress : Not at all  Social Connections: Socially Integrated   Frequency of Communication with Friends and Family: More than three times a week   Frequency of Social Gatherings with Friends and Family: More than three times a week   Attends Religious Services: More than 4 times per year   Active Member of [employer] or Organizations: Yes   Attends Music therapist: More  than 4 times per year   Marital Status: Married  Human resources officer Violence: Not on file    Review of Systems:    Constitutional: No weight loss, fever or chills Cardiovascular: No chest pain Respiratory: No SOB  Gastrointestinal: See HPI and otherwise negative   Physical Exam:  Vital signs: BP 120/68   Pulse 88   Ht '5\' 1"'$  (1.549 m)   Wt 159 lb (72.1 kg)   SpO2 98%   BMI 30.04 kg/m    Constitutional:   Pleasant elderly AA female appears to be in NAD, Well developed, Well nourished, alert and cooperative Respiratory: Respirations even and unlabored. Lungs clear to auscultation bilaterally.   No wheezes, crackles, or rhonchi.  Cardiovascular: Normal S1, S2. No MRG. Regular rate and rhythm. No peripheral edema, cyanosis or pallor.  Gastrointestinal:  Soft, mild distention nontender. No rebound or guarding. Normal bowel sounds. No appreciable masses or hepatomegaly. Psychiatric:Demonstrates good judgement and reason without abnormal affect or behaviors.  RELEVANT LABS AND IMAGING: CBC    Component Value Date/Time   WBC 9.0 07/18/2020 1411   WBC 7.7 06/25/2020 1457   RBC 4.71 07/18/2020 1412   RBC 4.70 07/18/2020 1411   HGB 13.6 07/18/2020 1411   HCT 42.1 07/18/2020 1411   HCT 29.9 (L) 03/11/2017 1112   PLT 370 07/18/2020 1411   MCV 89.6 07/18/2020 1411   MCH 28.9 07/18/2020 1411   MCHC 32.3 07/18/2020 1411   RDW 15.4 07/18/2020 1411   LYMPHSABS 2.4 07/18/2020 1411   MONOABS 0.6 07/18/2020 1411   EOSABS 0.2 07/18/2020 1411   BASOSABS 0.1 07/18/2020 1411    CMP     Component Value Date/Time   NA 138 06/25/2020 1457   K 4.2  06/25/2020 1457   CL 100 06/25/2020 1457   CO2 34 (H) 06/25/2020 1457   GLUCOSE 99 06/25/2020 1457   BUN 15 06/25/2020 1457   CREATININE 0.85 06/25/2020 1457   CREATININE 0.91 09/11/2019 1451   CALCIUM 9.0 06/25/2020 1457   PROT 7.2 06/25/2020 1457   ALBUMIN 3.9 06/25/2020 1457   AST 11 06/25/2020 1457   ALT 2 06/25/2020 1457   ALKPHOS 60  06/25/2020 1457   BILITOT 0.6 06/25/2020 1457   GFRNONAA >60 02/28/2018 1544   GFRAA >60 02/28/2018 1544    Assessment: 1.  Chronic constipation: Thought due to medications she is on, last colonoscopy 03/31/2018 with adenomatous polyps with recommendation repeat in 3 years, worsening constipation over the past few weeks, some better with addition of MiraLAX every day 2.  History of adenomatous polyps: Repeat colonoscopy due 03/31/2021  Plan: 1.  Discussed with patient that she is not yet due for her colonoscopy.  This will be due in February of next year. 2.  Patient would like to try a different medication.  Gave her samples of Linzess 72 mcg daily for a week.  Explained that if she liked how this works for her I am happy to provide a prescription #30 with 5 refills. 3.  If the above is too strong then would recommend that she titrate her MiraLAX.  We briefly discussed that she can use this up to 4 times a day if needed, would recommend she increase to twice daily at first.  May also want to play with the timing a little bit and take a dose in the morning when she gets up instead of in the evening before going to bed. 4.  Patient to follow in clinic with me in 2 months or sooner if necessary.  She was given my contact information and my nurses name so that she can report how she is doing in the interim.  Ellouise Newer, PA-C [facility] 10/14/2020, 3:12 PM  Cc: Plotnikov, Evie Lacks, MD

## 2020-10-15 ENCOUNTER — Other Ambulatory Visit: Payer: Self-pay

## 2020-10-15 ENCOUNTER — Encounter: Payer: Self-pay | Admitting: Cardiology

## 2020-10-15 ENCOUNTER — Ambulatory Visit (INDEPENDENT_AMBULATORY_CARE_PROVIDER_SITE_OTHER): Payer: Medicare Other

## 2020-10-15 ENCOUNTER — Ambulatory Visit (INDEPENDENT_AMBULATORY_CARE_PROVIDER_SITE_OTHER): Payer: Medicare Other | Admitting: Cardiology

## 2020-10-15 VITALS — BP 154/72 | HR 77 | Ht 61.0 in | Wt 159.8 lb

## 2020-10-15 DIAGNOSIS — R55 Syncope and collapse: Secondary | ICD-10-CM

## 2020-10-15 DIAGNOSIS — R9431 Abnormal electrocardiogram [ECG] [EKG]: Secondary | ICD-10-CM | POA: Diagnosis not present

## 2020-10-15 DIAGNOSIS — G4733 Obstructive sleep apnea (adult) (pediatric): Secondary | ICD-10-CM

## 2020-10-15 NOTE — Patient Instructions (Signed)
Medication Instructions:  Your physician recommends that you continue on your current medications as directed. Please refer to the Current Medication list given to you today.  *If you need a refill on your cardiac medications before your next appointment, please call your pharmacy*  Testing/Procedures: Your physician has requested that you have an echocardiogram. Echocardiography is a painless test that uses sound waves to create images of your heart. It provides your doctor with information about the size and shape of your heart and how well your heart's chambers and valves are working. This procedure takes approximately one hour. There are no restrictions for this procedure. This will be done at our [facility] location:  [place] has requested you wear a ZIO patch monitor for __14_ days.  This is a single patch monitor.   [employer] supplies one patch monitor per enrollment. Additional stickers are not available. Please do not apply patch if you will be having a Nuclear Stress Test, Echocardiogram, Cardiac CT, MRI, or Chest Xray during the period you would be wearing the monitor. The patch cannot be worn during these tests. You cannot remove and re-apply the ZIO XT patch monitor.  Your ZIO patch monitor will be sent Fed Ex from [employer] directly to your home address. It may take 3-5 days to receive your monitor after you have been enrolled.  Once you have received your monitor, please review the enclosed instructions. Your monitor has already been registered assigning a specific monitor serial # to you.  Billing and Patient Assistance Program Information   We have supplied [employer] with any of your insurance information on file for billing purposes. [employer] offers a sliding scale Patient Assistance Program for patients that do not have insurance, or whose insurance does not completely cover the cost of the  ZIO monitor.   You must apply for the Patient Assistance Program to qualify for this discounted rate.     To apply, please call [employer] at [phone], select option 4, then select option 2, and ask to apply for Patient Assistance Program.  Theodore Demark will ask your household income, and how many people are in your household.  They will quote your out-of-pocket cost based on that information.  [employer] will also be able to set up a 36-month interest-free payment plan if needed.  Applying the monitor   Shave hair from upper left chest.  Hold abrader disc by orange tab. Rub abrader in 40 strokes over the upper left chest as indicated in your monitor instructions.  Clean area with 4 enclosed alcohol pads. Let dry.  Apply patch as indicated in monitor instructions. Patch will be placed under collarbone on left side of chest with arrow pointing upward.  Rub patch adhesive wings for 2 minutes. Remove white label marked "1". Remove the white label marked "2". Rub patch adhesive wings for 2 additional minutes.  While looking in a mirror, press and release button in center of patch. A small green light will flash 3-4 times. This will be your only indicator that the monitor has been turned on. ?  Do not shower for the first 24 hours. You may shower after the first 24 hours.  Press the button if you feel a symptom. You will hear a small click. Record Date, Time and Symptom in the Patient Logbook.  When you are ready to remove the patch, follow instructions on the last 2 pages of the Patient  Logbook. Stick patch monitor onto the last page of Patient Logbook.  Place Patient Logbook in the blue and white box.  Use locking tab on box and tape box closed securely.  The blue and white box has prepaid postage on it. Please place it in the mailbox as soon as possible. Your physician should have your test results approximately 7 days after the monitor has been mailed back to [facility].  Call [place]  at [phone] if you have questions regarding your ZIO XT patch monitor. Call them immediately if you see an orange light blinking on your monitor.  If your monitor falls off in less than 4 days, contact our Monitor department at [phone]. ?If your monitor becomes loose or falls off after 4 days call [employer] at [phone] for suggestions on securing your monitor.?  Your physician has recommended that you have a sleep study. This test records several body functions during sleep, including: brain activity, eye movement, oxygen and carbon dioxide blood levels, heart rate and rhythm, breathing rate and rhythm, the flow of air through your mouth and nose, snoring, body muscle movements, and chest and belly movement.   Follow-Up: At [facility], you and your health needs are our priority.  As part of our continuing mission to provide you with exceptional heart care, we have created designated Provider Care Teams.  These Care Teams include your primary Cardiologist (physician) and Advanced Practice Providers (APPs -  Physician Assistants and Nurse Practitioners) who all work together to provide you with the care you need, when you need it.  We recommend signing up for the patient portal called "MyChart".  Sign up information is provided on this After Visit Summary.  MyChart is used to connect with patients for Virtual Visits (Telemedicine).  Patients are able to view lab/test results, encounter notes, upcoming appointments, etc.  Non-urgent messages can be sent to your provider as well.   To learn more about what you can do with MyChart, go to [email or web address].    Your next appointment:   3 month(s)  The format for your next appointment:   In Person  Provider:   Oswaldo Milian, MD

## 2020-10-15 NOTE — Progress Notes (Unsigned)
Patient enrolled for [employer] to mail a 14 day ZIO XT monitor to her home. 

## 2020-10-15 NOTE — Progress Notes (Signed)
Cardiology Office Note:    Date:  10/15/2020   ID:  Sameena, Corriher 05-Dec-1945, MRN [id number]  PCP:  Cassandria Anger, MD  Cardiologist:  None  Electrophysiologist:  None   Referring MD: Cassandria Anger, MD   Chief Complaint  Patient presents with   Loss of Consciousness     History of Present Illness:    Mallory Klein is a 75 y.o. female with a hx of Parkinson's disease, OSA, asthma, hypertension who is referred by Dr. Alain Marion for evaluation of syncope.  She reports she has had issues chronically with falls due to her Parkinson's but over the last 2 months has had spells where she thinks she has passed out.  First happened about 75-month ago.  Reports she was sitting on the couch and got up to go to the kitchen and then next thing she knew she was lying on the ground.  She hit her head and injured her shoulder.  Has had 2 more falls since that time.  She does not remember what happened with these.  Denies recent position change reports she was walking in her bedroom and next thing she knew she was on the ground.  She denies any palpitations, chest pain, dyspnea, or edema.  Does report intermittent lightheadedness that she has attributed to vertigo.  Has a history of OSA but has not been using CPAP, no longer has machine.  Denies any smoking history.  No history of heart disease in her immediate family.   Past Medical History:  Diagnosis Date   Abnormal CBC 02/05/2015   Dr FBurr Medico12/16 new: normocytic anemia and thrombocytosis   Adjustment disorder with mixed anxiety and depressed mood 02/15/2007   Chronic  Chronic pain Grief, stress Effexor XR  Dad died in [address]/01/02  Asthma    Diverticulosis of colon (without mention of hemorrhage)    Esophageal stricture    Essential hypertension 01/26/2007   Chronic Verapamil    GERD (gastroesophageal reflux disease) 02/15/2007   Chronic     Heart murmur    Hyperglycemia 03/02/2011   Mild     Insomnia disorder 12/08/2018    10/20 Carbid/Lev dose was increased - c/o hard time falling asleep (6 am) getting up at 12 am, poor sleep. Try Temazepam 15-30 mg at 11-1:30 pm    Internal hemorrhoids without mention of complication    Iron deficiency anemia 04/23/2017   Mild neurocognitive disorder, likely due to Parkinson's disease 02/09/2019   OSA (obstructive sleep apnea) 02/14/2010   In lab study (May 2018): AHI 25. Only had 45 minutes of sleep secondary to frequent awakenings secondary to sleep apnea. autocpap 5-15 cm water.    Osteoarthritis    Parkinson disease [place] [address]-[place]02  2015 2017  Primidone - d/c 2019 Parkinson's: [facility] IR. Dr Tat   Postherpetic trigeminal neuralgia 01/26/2007   Qualifier: Diagnosis of  By: BMarca AnconaRMA, [name]     Shortness of breath dyspnea    with allergies   Thrombocytosis 05/24/2017   Urinary incontinence 01/26/2007   Chronic  10/17 Detrol [place]    Vitamin D deficiency 01/26/2019    Past Surgical History:  Procedure Laterality Date   ABDOMINAL HYSTERECTOMY     BREAST CYST EXCISION Left 1980's   LUMBAR LAMINECTOMY/DECOMPRESSION MICRODISCECTOMY Bilateral 02/20/2014   Procedure: LUMBAR TWO THREE, LUMBAR THREE FOUR LUMBAR LAMINECTOMY/DECOMPRESSION MICRODISCECTOMY 2 LEVELS;  Surgeon: DEustace Moore MD;  [facility];  Service: Neurosurgery;  Laterality: Bilateral;   SPLENECTOMY  TIBIA FRACTURE SURGERY Right    TIBIA FRACTURE SURGERY Left    TOTAL KNEE ARTHROPLASTY Right 2013    Current Medications: Current Meds  Medication Sig   buprenorphine (BUTRANS) 7.5 MCG/HR 1 patch once a week.   celecoxib (CELEBREX) 200 MG capsule Take 1 capsule (200 mg total) by mouth 2 (two) times daily as needed for moderate pain.   cetirizine (ZYRTEC) 10 MG tablet Take 1 tablet (10 mg total) by mouth daily.   Cholecalciferol 25 MCG (1000 UT) tablet Take by mouth.   cyanocobalamin (,VITAMIN B-12,) 1000 MCG/ML injection INJECT 1 ML INTO THE SKIN EVERY 14 DAYS   diclofenac Sodium (VOLTAREN) 1 % GEL  Apply 4 g topically daily as needed (pain).   docusate sodium (COLACE) 100 MG capsule Take 100 mg by mouth at bedtime.   hydrOXYzine (VISTARIL) 25 MG capsule Take 25 mg by mouth once.   montelukast (SINGULAIR) 10 MG tablet Take 1 tablet (10 mg total) by mouth daily.   OVER THE COUNTER MEDICATION Take 1 tablet by mouth at bedtime. Lions mane extract   pantoprazole (PROTONIX) 40 MG tablet Take 1 tablet (40 mg total) by mouth daily.   polyethylene glycol (MIRALAX / GLYCOLAX) 17 g packet Take 17 g by mouth daily as needed for mild constipation.   verapamil (VERELAN PM) 120 MG 24 hr capsule Take 1 capsule (120 mg total) by mouth at bedtime.   zolpidem (AMBIEN) 10 MG tablet TAKE 1/2 TO 1 TABLET BY  MOUTH AT BEDTIME AS NEEDED  FOR SLEEP     Allergies:   Ace inhibitors, Benicar [olmesartan], Aspirin, and Citalopram hydrobromide   Social History   Socioeconomic History   Marital status: Married    Spouse name: Not on file   Number of children: 2   Years of education: 14   Highest education level: Associate degree: occupational, Hotel manager, or vocational program  Occupational History   Occupation: Retired    Comment: Network engineer, Youth worker   Tobacco Use   Smoking status: Never   Smokeless tobacco: Never  Vaping Use   Vaping Use: Never used  Substance and Sexual Activity   Alcohol use: No    Alcohol/week: 2.0 standard drinks    Types: 2 Glasses of wine per week    Comment: wine on occ    Drug use: No   Sexual activity: Not Currently  Other Topics Concern   Not on file  Social History Narrative   Drink tea daily and coffee on occ   Right handed   1 story    Lives with spouse   Social Determinants of Health   Financial Resource Strain: Low Risk    Difficulty of Paying Living Expenses: Not hard at all  Food Insecurity: No Food Insecurity   Worried About Charity fundraiser in the Last Year: Never true   [place] in the Last Year: Never true  Transportation Needs: No  Transportation Needs   Lack of Transportation (Medical): No   Lack of Transportation (Non-Medical): No  Physical Activity: Inactive   Days of Exercise per Week: 0 days   Minutes of Exercise per Session: 0 min  Stress: No Stress Concern Present   Feeling of Stress : Not at all  Social Connections: Socially Integrated   Frequency of Communication with Friends and Family: More than three times a week   Frequency of Social Gatherings with Friends and Family: More than three times a week   Attends Religious Services: More  than 4 times per year   Active Member of Clubs or Organizations: Yes   Attends Music therapist: More than 4 times per year   Marital Status: Married     Family History: The patient's family history includes Asthma in her mother; Dementia in her mother; Diabetes in her mother; Diabetes type II in her mother; Hypertension in her mother; Mental illness in her father; Prostate cancer in her father; Stroke in her brother; Suicidality in her son. There is no history of Colon cancer or Rectal cancer.  ROS:   Please see the history of present illness.     All other systems reviewed and are negative.  EKGs/Labs/Other Studies Reviewed:    The following studies were reviewed today:   EKG:  EKG is  ordered today.  The ekg ordered today demonstrates normal sinus rhythm, rate 77, nonspecific T wave flattening, poor R wave progression, left axis deviation  Recent Labs: 06/25/2020: ALT 2; BUN 15; Creatinine, Ser 0.85; Potassium 4.2; Sodium 138; TSH 0.67 07/18/2020: Hemoglobin 13.6; Platelet Count 370  Recent Lipid Panel    Component Value Date/Time   CHOL 219 (H) 02/05/2015 1434   TRIG 69.0 02/05/2015 1434   HDL 73.70 02/05/2015 1434   CHOLHDL 3 02/05/2015 1434   VLDL 13.8 02/05/2015 1434   LDLCALC 132 (H) 02/05/2015 1434   LDLDIRECT 109.8 02/10/2010 1031    Physical Exam:    VS:  BP (!) 154/72   Pulse 77   Ht '5\' 1"'$  (1.549 m)   Wt 159 lb 12.8 oz (72.5 kg)    SpO2 95%   BMI 30.19 kg/m     Wt Readings from Last 3 Encounters:  10/15/20 159 lb 12.8 oz (72.5 kg)  10/14/20 159 lb (72.1 kg)  08/21/20 160 lb (72.6 kg)     GEN:  Well nourished, well developed in no acute distress HEENT: Normal NECK: No JVD; No carotid bruits LYMPHATICS: No lymphadenopathy CARDIAC: RRR, no murmurs, rubs, gallops RESPIRATORY:  Clear to auscultation without rales, wheezing or rhonchi  ABDOMEN: Soft, non-tender, non-distended MUSCULOSKELETAL:  No edema; No deformity  SKIN: Warm and dry NEUROLOGIC:  Alert and oriented x 3 PSYCHIATRIC:  Normal affect   ASSESSMENT:    1. Syncope and collapse   2. Nonspecific abnormal electrocardiogram (ECG) (EKG)   3. OSA (obstructive sleep apnea)    PLAN:    Syncope: Has history of falls due to Parkinson's but recent falls concerning for syncopal episodes.  One of the episodes sounds like orthostasis, but other episodes occurred without position change.  Orthostatics in clinic today were unremarkable.  No prodromal symptoms.  Will check Zio patch x2 weeks to evaluate for arrhythmia.  Check echocardiogram to evaluate for structural heart disease.  Abnormal EKG: Poor R wave progression.  Check echocardiogram as above  OSA: Not on CPAP, reports she no longer has machine.  Epworth 15. Will check sleep study and if confirmed OSA referred to sleep medicine   RTC in 3 months   Medication Adjustments/Labs and Tests Ordered: Current medicines are reviewed at length with the patient today.  Concerns regarding medicines are outlined above.  Orders Placed This Encounter  Procedures   LONG TERM MONITOR (3-14 DAYS)   EKG 12-Lead   ECHOCARDIOGRAM COMPLETE   Split night study    No orders of the defined types were placed in this encounter.   Patient Instructions  Medication Instructions:  Your physician recommends that you continue on your current medications as directed. Please  refer to the Current Medication list given to you  today.  *If you need a refill on your cardiac medications before your next appointment, please call your pharmacy*  Testing/Procedures: Your physician has requested that you have an echocardiogram. Echocardiography is a painless test that uses sound waves to create images of your heart. It provides your doctor with information about the size and shape of your heart and how well your heart's chambers and valves are working. This procedure takes approximately one hour. There are no restrictions for this procedure. This will be done at our [facility] location:  [place] has requested you wear a ZIO patch monitor for __14_ days.  This is a single patch monitor.   [employer] supplies one patch monitor per enrollment. Additional stickers are not available. Please do not apply patch if you will be having a Nuclear Stress Test, Echocardiogram, Cardiac CT, MRI, or Chest Xray during the period you would be wearing the monitor. The patch cannot be worn during these tests. You cannot remove and re-apply the ZIO XT patch monitor.  Your ZIO patch monitor will be sent Fed Ex from [employer] directly to your home address. It may take 3-5 days to receive your monitor after you have been enrolled.  Once you have received your monitor, please review the enclosed instructions. Your monitor has already been registered assigning a specific monitor serial # to you.  Billing and Patient Assistance Program Information   We have supplied [employer] with any of your insurance information on file for billing purposes. [employer] offers a sliding scale Patient Assistance Program for patients that do not have insurance, or whose insurance does not completely cover the cost of the ZIO monitor.   You must apply for the Patient Assistance Program to qualify for this discounted rate.     To apply, please call [employer] at [phone], select option 4,  then select option 2, and ask to apply for Patient Assistance Program.  Theodore Demark will ask your household income, and how many people are in your household.  They will quote your out-of-pocket cost based on that information.  [employer] will also be able to set up a 46-month interest-free payment plan if needed.  Applying the monitor   Shave hair from upper left chest.  Hold abrader disc by orange tab. Rub abrader in 40 strokes over the upper left chest as indicated in your monitor instructions.  Clean area with 4 enclosed alcohol pads. Let dry.  Apply patch as indicated in monitor instructions. Patch will be placed under collarbone on left side of chest with arrow pointing upward.  Rub patch adhesive wings for 2 minutes. Remove white label marked "1". Remove the white label marked "2". Rub patch adhesive wings for 2 additional minutes.  While looking in a mirror, press and release button in center of patch. A small green light will flash 3-4 times. This will be your only indicator that the monitor has been turned on. ?  Do not shower for the first 24 hours. You may shower after the first 24 hours.  Press the button if you feel a symptom. You will hear a small click. Record Date, Time and Symptom in the Patient Logbook.  When you are ready to remove the patch, follow instructions on the last 2 pages of the Patient Logbook. Stick patch monitor onto the last page of Patient Logbook.  Place Patient Logbook  in the blue and white box.  Use locking tab on box and tape box closed securely.  The blue and white box has prepaid postage on it. Please place it in the mailbox as soon as possible. Your physician should have your test results approximately 7 days after the monitor has been mailed back to [facility].  Call [place] at [phone] if you have questions regarding your ZIO XT patch monitor. Call them immediately if you see an orange light blinking on your monitor.  If your monitor  falls off in less than 4 days, contact our Monitor department at [phone]. ?If your monitor becomes loose or falls off after 4 days call [employer] at [phone] for suggestions on securing your monitor.?  Your physician has recommended that you have a sleep study. This test records several body functions during sleep, including: brain activity, eye movement, oxygen and carbon dioxide blood levels, heart rate and rhythm, breathing rate and rhythm, the flow of air through your mouth and nose, snoring, body muscle movements, and chest and belly movement.   Follow-Up: At [facility], you and your health needs are our priority.  As part of our continuing mission to provide you with exceptional heart care, we have created designated Provider Care Teams.  These Care Teams include your primary Cardiologist (physician) and Advanced Practice Providers (APPs -  Physician Assistants and Nurse Practitioners) who all work together to provide you with the care you need, when you need it.  We recommend signing up for the patient portal called "MyChart".  Sign up information is provided on this After Visit Summary.  MyChart is used to connect with patients for Virtual Visits (Telemedicine).  Patients are able to view lab/test results, encounter notes, upcoming appointments, etc.  Non-urgent messages can be sent to your provider as well.   To learn more about what you can do with MyChart, go to [email or web address].    Your next appointment:   3 month(s)  The format for your next appointment:   In Person  Provider:   Oswaldo Milian, MD      Signed, Donato Heinz, MD  10/15/2020 11:37 PM    [place]

## 2020-10-17 ENCOUNTER — Telehealth: Payer: Self-pay | Admitting: *Deleted

## 2020-10-17 ENCOUNTER — Other Ambulatory Visit: Payer: Self-pay | Admitting: Internal Medicine

## 2020-10-17 NOTE — Telephone Encounter (Signed)
-----   Message from Silverio Lay, RN sent at 10/15/2020  3:07 PM EDT ----- Regarding: sleep study Sleep study ordered  Hx of OSA not on CPAP Epworth 15

## 2020-10-17 NOTE — Telephone Encounter (Signed)
Left sleep study appointment on patient's voicemail.

## 2020-10-22 DIAGNOSIS — R55 Syncope and collapse: Secondary | ICD-10-CM | POA: Diagnosis not present

## 2020-10-29 ENCOUNTER — Ambulatory Visit: Payer: BC Managed Care – PPO | Admitting: Internal Medicine

## 2020-10-30 DIAGNOSIS — M48061 Spinal stenosis, lumbar region without neurogenic claudication: Secondary | ICD-10-CM | POA: Diagnosis not present

## 2020-10-30 DIAGNOSIS — M47816 Spondylosis without myelopathy or radiculopathy, lumbar region: Secondary | ICD-10-CM | POA: Diagnosis not present

## 2020-10-30 DIAGNOSIS — M1712 Unilateral primary osteoarthritis, left knee: Secondary | ICD-10-CM | POA: Diagnosis not present

## 2020-10-30 DIAGNOSIS — Z79891 Long term (current) use of opiate analgesic: Secondary | ICD-10-CM | POA: Diagnosis not present

## 2020-10-30 DIAGNOSIS — G894 Chronic pain syndrome: Secondary | ICD-10-CM | POA: Diagnosis not present

## 2020-11-04 ENCOUNTER — Other Ambulatory Visit (HOSPITAL_COMMUNITY): Payer: Medicare Other

## 2020-11-12 DIAGNOSIS — R55 Syncope and collapse: Secondary | ICD-10-CM | POA: Diagnosis not present

## 2020-11-19 ENCOUNTER — Encounter: Payer: Self-pay | Admitting: Neurology

## 2020-11-19 ENCOUNTER — Encounter: Payer: Self-pay | Admitting: Hematology

## 2020-11-19 ENCOUNTER — Ambulatory Visit (INDEPENDENT_AMBULATORY_CARE_PROVIDER_SITE_OTHER): Payer: Medicare Other | Admitting: Neurology

## 2020-11-19 VITALS — BP 138/82 | HR 82 | Ht 61.5 in | Wt 160.0 lb

## 2020-11-19 DIAGNOSIS — K5909 Other constipation: Secondary | ICD-10-CM | POA: Diagnosis not present

## 2020-11-19 DIAGNOSIS — G20 Parkinson's disease: Secondary | ICD-10-CM

## 2020-11-19 DIAGNOSIS — Z9181 History of falling: Secondary | ICD-10-CM

## 2020-11-19 DIAGNOSIS — R55 Syncope and collapse: Secondary | ICD-10-CM

## 2020-11-19 MED ORDER — PRAMIPEXOLE DIHYDROCHLORIDE 0.125 MG PO TABS: 0.1250 mg | ORAL_TABLET | Freq: Three times a day (TID) | ORAL | 3 refills | Status: DC

## 2020-11-19 NOTE — Progress Notes (Signed)
Subjective:    Patient ID: Mallory Klein is a 75 y.o. female.  HPI    Interim history:   Mallory Klein is a 75 year old right-handed woman with an underlying medical history of tremors, sleep apnea, iron deficiency anemia, vitamin D deficiency, low back pain, with history of lumbar degenerative spine disease, followed by pain management, reflux disease, hypertension, diverticulosis, asthma, and mild obesity, who presents for follow-up consultation of her Parkinson's disease, for her 8-monthcheckup.  The patient is accompanied by her husband again today. I last saw her on 05/22/2020, at which time she reported a few falls.  She had seen Mallory. BRedmond Basemanfor anosmia.  He ordered a brain MRI.  She had a brain MRI with and without contrast on 05/27/2020 and I reviewed the results: IMPRESSION: 1. No acute intracranial abnormality. 2. Moderately advanced but nonspecific cerebral white matter signal changes, most commonly due to chronic small vessel disease. 3. Olfactory bulbs are not identified suspicious for atrophy. 4. Mild paranasal sinus inflammation. 5. C3-C4 disc degeneration with mild cervical spinal stenosis.   She was not eating very well.  She had significant nausea.  She was also struggling with constipation and saw GI.  She was advised to continue with Sinemet CR 1 pill 3 times daily.  Today, 11/19/2020: She reports worsening tremors, she has reduced her Sinemet CR to 1 pill twice daily.  She reports that we discussed it at the last visit.  We actually did not plan on reducing her Sinemet CR to twice daily.  At any rate, she is taking it twice daily, she has noticed changes in her posture and her balance.  She has had several falls.  She reports a history of syncope and collapse.  She has seen cardiology recently.  She reports that she lost consciousness.  She had a Holter monitor.  She is in the process of being reevaluated for her sleep apnea as she has a prior diagnosis but has not used her  CPAP in years.  She no longer has the machine even.  [facility] per cardiology.  She does report that her falls occurred when she stood up.  She had cardiac work-up in addition with echocardiogram and an EKG.   Previously:   I first met her at the request of her primary care physician on 11/22/2019, at which time she requested a switch in providers from Mallory. TCarles Colletto our office.  She reported a history of tremors for about 3 years.  She was diagnosed with Parkinson's disease and had been on Sinemet.  She was having difficulty keeping a schedule of her medicine.  She was advised to streamline her medication regimen and switch to Sinemet CR 50-200 mg strength 1 pill 4 times a day.  She was reluctant to take it 4 times a day and was agreeable to taking it 3 times a day.  She called in the interim reporting more issues with her balance and near falls as well as falls.       11/22/19: (She) reports tremors for the past approximately 3 years.  She has had changes in her walking abilities.  She has had posture changes but also has undergone several surgeries and had injuries in the past.  She has chronic back pain and had a failed right knee replacement which complicates the situation.  She has seen a decline in her ability to write.  She reports that her tremor is  worse on the right side.  She wonders if her father had Parkinson's disease.  He had decline in his handwriting and also developed a tremor.   She started seeing [facility] at [facility] in October 2019 and I reviewed available neurology notes.  She was diagnosed with tremor predominant Parkinson's disease at the time and started on immediate release Sinemet.  She was subsequently switched from Sinemet IR to Sinemet CR. I reviewed your office note from 09/11/2019.  She had some blood work at the time including B12 level and BMP, kidney function was good, B12 level elevated. She reports difficulty  adhering to a medication schedule as she does have side effects from taking the levodopa particularly queasiness.  She also believes that taking it on an empty stomach makes it worse.  She has not been sleeping very well and sometimes goes to sleep in the early morning hours around 5 or 6 AM even.  She has had a recent prescription from your office for temazepam and started taking 15 mg first and then increase it to 30 mg.  She tries to take it at night, reports that she has been able to go to sleep around 1 AM at times.  She is hoping to scale back to 15 mg daily.  She is on chronic pain medication.  She is supposed to take Sinemet CR 25-100 mg strength 2 pills 3 times daily at 10 AM, 2 PM and 6 PM.  She tries to adhere to the schedule.  Sometimes she does not take it 3 times a day and only twice daily but admits that she has taken 3 tablets together as well.  She is taking Sinemet CR 50-200 mg strength at bedtime.  She drinks caffeine in the form of ice tea, 1 or 2 cups/day.  She is a non-smoker.  She drinks alcohol occasionally in the form of white wine.  She lives with her husband and 1 dog.  They had 2 children, one passed away.  She also takes baclofen for her back pain.  She has been on Aricept for memory loss.  She had neuropsychological evaluation through the [facility], [facility] and was deemed to have mild cognitive impairment.  She denies any hallucinations but has had instances of hearing a shuffling sound in her ears, it happens infrequently and has actually improved.  In the past, she also had muffled hearing and was found to have cerumen impaction.  Her Past Medical History Is Significant For: Past Medical History:  Diagnosis Date   Abnormal CBC 02/05/2015   M[facility]ory [facility] 12/16 new: normocytic anemia and thrombocytosis   Adjustment disorder with mixed anxiety and depressed mood 02/15/2007   Chronic  Chronic pain Grief, stress Effexor XR  Dad died in March 12, 2023   Asthma    Diverticulosis  of colon (without mention of hemorrhage)    Esophageal stricture    Essential hypertension 01/26/2007   Chronic Verapamil    Falls    "I blackout"   GERD (gastroesophageal reflux disease) 02/15/2007   Chronic     Heart murmur    Hyperglycemia 03/02/2011   Mild     Insomnia disorder 12/08/2018   10/20 Carbid/Lev dose was increased - c/o hard time falling asleep (6 am) getting up at 12 am, poor sleep. Try Temazepam 15-30 mg at 11-1:30 pm    Internal hemorrhoids without mention of complication    Iron deficiency anemia 04/23/2017   Mild neurocognitive disorder, likely due to Parkinson's disease 02/09/2019  OSA (obstructive sleep apnea) 02/14/2010   In lab study (May 2018): AHI 25. Only had 45 minutes of sleep secondary to frequent awakenings secondary to sleep apnea. autocpap 5-15 cm water.    Osteoarthritis    Parkinson disease ([place]) 02/09/2014   2015 2017  Primidone - d/c 2019 Parkinson's: Sinmet IR. Mallory Tat   Postherpetic trigeminal neuralgia 01/26/2007   Qualifier: Diagnosis of  By: Marca Ancona RMA, [name]     Shortness of breath dyspnea    with allergies   Thrombocytosis 05/24/2017   Urinary incontinence 01/26/2007   Chronic  10/17 Detrol LA    Vitamin D deficiency 01/26/2019    Her Past Surgical History Is Significant For: Past Surgical History:  Procedure Laterality Date   ABDOMINAL HYSTERECTOMY     BREAST CYST EXCISION Left 1980's   LUMBAR LAMINECTOMY/DECOMPRESSION MICRODISCECTOMY Bilateral 02/20/2014   Procedure: LUMBAR TWO THREE, LUMBAR THREE FOUR LUMBAR LAMINECTOMY/DECOMPRESSION MICRODISCECTOMY 2 LEVELS;  Surgeon: Eustace Moore, MD;  Location: [facility];  Service: Neurosurgery;  Laterality: Bilateral;   SPLENECTOMY     TIBIA FRACTURE SURGERY Right    TIBIA FRACTURE SURGERY Left    TOTAL KNEE ARTHROPLASTY Right 2013    Her Family History Is Significant For: Family History  Problem Relation Age of Onset   Hypertension Mother    Diabetes type II Mother    Asthma  Mother    Diabetes Mother    Dementia Mother    Prostate cancer Father    Mental illness Father        dementia   Suicidality Son    Stroke Brother        died in prison   Colon cancer Neg Hx    Rectal cancer Neg Hx     Her Social History Is Significant For: Social History   Socioeconomic History   Marital status: Married    Spouse name: Not on file   Number of children: 2   Years of education: 14   Highest education level: Associate degree: occupational, Hotel manager, or vocational program  Occupational History   Occupation: Retired    Comment: Network engineer, Youth worker   Tobacco Use   Smoking status: Never   Smokeless tobacco: Never  Vaping Use   Vaping Use: Never used  Substance and Sexual Activity   Alcohol use: Yes    Alcohol/week: 2.0 standard drinks    Types: 2 Glasses of wine per week    Comment: wine on occ    Drug use: No   Sexual activity: Not Currently  Other Topics Concern   Not on file  Social History Narrative   Drink tea and coffee on occ   Right handed   1 story    Lives with spouse   Social Determinants of Klein   Financial Resource Strain: Low Risk    Difficulty of Paying Living Expenses: Not hard at all  Food Insecurity: No Food Insecurity   Worried About Charity fundraiser in the Last Year: Never true   Ran Out of Food in the Last Year: Never true  Transportation Needs: No Transportation Needs   Lack of Transportation (Medical): No   Lack of Transportation (Non-Medical): No  Physical Activity: Inactive   Days of Exercise per Week: 0 days   Minutes of Exercise per Session: 0 min  Stress: No Stress Concern Present   Feeling of Stress : Not at all  Social Connections: Socially Integrated   Frequency of Communication with Friends and Family: More than  three times a week   Frequency of Social Gatherings with Friends and Family: More than three times a week   Attends Religious Services: More than 4 times per year   Active Member of Clubs or  Organizations: Yes   Attends Music therapist: More than 4 times per year   Marital Status: Married    Her Allergies Are:  Allergies  Allergen Reactions   Ace Inhibitors     Patient doesn't recall reaction.  jkl   Benicar [Olmesartan]     It made her sick   Aspirin Other (See Comments)    bruising   Citalopram Hydrobromide Diarrhea  :   Her Current Medications Are:  Outpatient Encounter Medications as of 11/19/2020  Medication Sig   buprenorphine (BUTRANS) 7.5 MCG/HR 1 patch once a week.   carbidopa-levodopa (SINEMET CR) 50-200 MG tablet Take 1 tablet by mouth 2 (two) times daily.   celecoxib (CELEBREX) 200 MG capsule Take 1 capsule (200 mg total) by mouth 2 (two) times daily as needed for moderate pain.   cetirizine (ZYRTEC) 10 MG tablet TAKE 1 TABLET BY MOUTH EVERY DAY   Cholecalciferol 25 MCG (1000 UT) tablet Take by mouth.   cyanocobalamin (,VITAMIN B-12,) 1000 MCG/ML injection INJECT 1 ML INTO THE SKIN EVERY 14 DAYS   diclofenac Sodium (VOLTAREN) 1 % GEL Apply 4 g topically daily as needed (pain).   docusate sodium (COLACE) 100 MG capsule Take 100 mg by mouth at bedtime.   hydrOXYzine (VISTARIL) 25 MG capsule Take 25 mg by mouth once.   montelukast (SINGULAIR) 10 MG tablet Take 1 tablet (10 mg total) by mouth daily.   OVER THE COUNTER MEDICATION Take 1 tablet by mouth at bedtime. Lions mane extract   pantoprazole (PROTONIX) 40 MG tablet Take 1 tablet (40 mg total) by mouth daily.   polyethylene glycol (MIRALAX / GLYCOLAX) 17 g packet Take 17 g by mouth daily as needed for mild constipation.   verapamil (VERELAN PM) 120 MG 24 hr capsule Take 1 capsule (120 mg total) by mouth at bedtime.   zolpidem (AMBIEN) 10 MG tablet TAKE 1/2 TO 1 TABLET BY  MOUTH AT BEDTIME AS NEEDED  FOR SLEEP   [DISCONTINUED] carbidopa-levodopa (SINEMET CR) 50-200 MG tablet Take 1 pill 3 times daily at 11 AM, 5 PM and 10 PM daily. (Patient taking differently: Take 1 tablet by mouth See admin  instructions. Take 1 pill 3 times daily at 11 AM, 5 PM and 10 PM daily.)   [DISCONTINUED] donepezil (ARICEPT) 5 MG tablet Take 1 tablet (5 mg total) by mouth at bedtime. (Patient not taking: No sig reported)   [DISCONTINUED] venlafaxine XR (EFFEXOR-XR) 150 MG 24 hr capsule TAKE 1 CAPSULE BY MOUTH  EVERY DAY WITH BREAKFAST (Patient not taking: No sig reported)   No facility-administered encounter medications on file as of 11/19/2020.  :  Review of Systems:  Out of a complete 14 point review of systems, all are reviewed and negative with the exception of these symptoms as listed below:  Review of Systems  Neurological:        Patient is here for follow-up of her PD. She reports this year she has had falls with blacking out and she has been seeing cardiology. She has an Echo and a sleep study pending. She reports her tremor in her legs/feet are worse. Her hands are shaky.    Objective:  Neurological Exam  Physical Exam Physical Examination:   Vitals:   11/19/20 1413  BP: 138/82  Pulse: 82    General Examination: The patient is a very pleasant 75 y.o. female in no acute distress. She appears well-developed and well-nourished and well groomed.   HEENT: Normocephalic, atraumatic, pupils are equal, round and reactive to light, extraocular tracking is preserved, no nystagmus, she is status post cataract repairs, corrective eyeglasses in place.  She has mild nuchal rigidity, no significant head trauma today.  Hearing is grossly intact, mild facial masking noted, intermittent mild voice tremor, mild hypophonia.  No dysarthria.  Airway examination reveals mild mouth dryness.  Tongue protrudes centrally and palate elevates symmetrically.    Chest: Clear to auscultation without wheezing, rhonchi or crackles noted.   Heart: S1+S2+0, regular and normal without murmurs, rubs or gallops noted.    Abdomen: Soft, non-tender and non-distended.   Extremities: There is trace pitting edema in the distal  lower extremities bilaterally. Right leg larger in caliber than left.    Skin: Warm and dry without trophic changes noted.   Musculoskeletal: exam reveals low back pain, increase in lumbar kyphosis, unequal hip height, right leg shorter than left, pain in both knees, right more than left, left genu valgus, stable findings.    Neurologically:  Mental status: The patient is awake, alert and oriented in all 4 spheres. Her immediate and remote memory, attention, language skills and fund of knowledge are mildly impaired.  Her husband provides additional information.   Mood is normal and affect is normal.    Cranial nerves II - XII are as described above under HEENT exam.  Motor exam: Normal bulk, strength and tone is noted. There is no drift, mild postural tremor in the right more than left upper extremity, no significant action tremor.    Romberg is not tested for safety concerns. Fine motor skills and coordination mild to moderately impaired, right side more noticeable than left, overall fine motor skills in the lower extremities also mild to moderately impaired, right side more noticeable.   She has mild increase in tone with slight cogwheeling noted in the right upper extremity only.  She has a mild to moderate resting tremor in the right upper extremity, intermittent milder tremor in the left upper extremity, she has a resting tremor in the right lower extremity, very slight in the left lower extremity.     Cerebellar testing: No dysmetria or intention tremor on finger to nose testing. Heel to shin is not requested secondary to pain in both knees.    Sensory exam: intact to light touch in the upper and lower extremities.  Gait, station and balance: She stands with mild difficulty.  Posture is stooped but also confounded secondary to back pain and increase in lumbar kyphosis.  She has unequal shoulder height as well, and equal hip height as well.  She walks with her rolling walker, no obvious  shuffling.  Balance is mildly impaired.   Assessment and Plan:    In summary, ALTAMESE DEGUIRE is a very pleasant 75 year old female with an underlying medical history of tremors, sleep apnea, iron deficiency anemia, vitamin D deficiency, LBP, with history of lumbar degenerative spine disease, followed by pain management, reflux disease, hypertension, diverticulosis, asthma, and overweight state, who presents for follow-up consultation of her parkinsonism, likely right-sided predominant Parkinson's disease, complicated by chronic constipation, also medication sensitivity, recent history of falls and syncopal spells.  She also carries a prior diagnosis of obstructive sleep apnea but has not been on her CPAP in some years.  She is  in the process of getting reevaluated.  She has seen cardiology and had a 2-week heart monitor for syncope evaluation.  She is at risk for orthostatic hypotension secondary to Parkinson's disease.  We talked about the importance of good nutrition, staying well-hydrated, changing positions slowly, fall prevention, using her walker at all times. She has a family history of tremor, possibly Parkinson's disease in her father, unclear.  Nevertheless, she has been on levodopa therapy.  In September 2021 we switched her to Sinemet CR 50-200 mg strength 1 pill 3 times daily.  She has had nausea with her medication and has been taking it with Glucerna.  She was discouraged from using a protein milkshake or Glucerna with her medicine, as it may impair absorption of the levodopa.  Instead, she was encouraged to try saltine crackers or graham crackers with diet ginger ale if need be. She was advised to continue with her Sinemet CR in March 2022, 1 pill 3 times daily but has reduced it to 1 pill twice daily.  At this juncture, we can consider something different, she is advised to taper off her Sinemet CR and start Mirapex 0.125 mg strength 1 pill once daily and gradually increase to 1 pill 3 times  daily.  At the next visit, we will try to increase it further if possible to 2 pills 3 times daily and then 3 pills 3 times daily eventually.  We talked about expectations, possible side effects and limitations of her medication.  She is advised to call us with any interim questions or concerns and follow-up routinely to see one of our nurse practitioners in about 3 months or 4 months.  I answered all her questions today and the patient and her husband were in agreement.  I also suggested we refer her to neuro rehab, physical therapy.  She is worried about the cost of therapy.  We may be able to get her in for just a few sessions.  She is agreeable to a referral. I spent 40 minutes in total face-to-face time and in reviewing records during pre-charting, more than 50% of which was spent in counseling and coordination of care, reviewing test results, reviewing medications and treatment regimen and/or in discussing or reviewing the diagnosis of PD, the prognosis and treatment options. Pertinent laboratory and imaging test results that were available during this visit with the patient were reviewed by me and considered in my medical decision making (see chart for details).

## 2020-11-19 NOTE — Patient Instructions (Signed)
It was nice to see you both again today.  While we did not discuss reducing your Sinemet CR at the last visit in March 2022, it is okay for you to come off of it at this point.  We will try something different once you are off your Sinemet CR.  To that end, reduce it to 1 pill once daily for the next week and then stop it altogether.  Once you are off of it, you can start your new medication called Mirapex (generic name: pramipexole) 0.125 mg: Take 1 pill once daily for 3 days, then 1 pill twice daily for 3 days, then 1 pill three times a day thereafter. Common side effects reported are: Sedation, sleepiness, nausea, vomiting, and rare side effects are confusion, hallucinations, swelling in legs, and abnormal behaviors, including impulse control problems, which can manifest as excessive eating, obsessions with food or gambling, or hypersexuality.  Please follow-up to see one of our nurse practitioners in 3 months, at that time, if you tolerate the medication, we will increase it to 2 pills 3 times a day and then eventually 3 pills 3 times a day if possible.

## 2020-11-20 ENCOUNTER — Ambulatory Visit (HOSPITAL_COMMUNITY): Payer: Medicare Other | Attending: Cardiology

## 2020-11-20 ENCOUNTER — Other Ambulatory Visit: Payer: Self-pay

## 2020-11-20 DIAGNOSIS — R55 Syncope and collapse: Secondary | ICD-10-CM

## 2020-11-20 DIAGNOSIS — R9431 Abnormal electrocardiogram [ECG] [EKG]: Secondary | ICD-10-CM

## 2020-11-20 LAB — ECHOCARDIOGRAM COMPLETE
Area-P 1/2: 1.37 cm2
P 1/2 time: 454 msec
S' Lateral: 3 cm

## 2020-11-21 ENCOUNTER — Telehealth: Payer: Self-pay | Admitting: Cardiology

## 2020-11-21 NOTE — Telephone Encounter (Signed)
Pt had a 3:20 appt for an Electrocardiogram... left book called [employer] would like to know if it was returned... please advise

## 2020-11-21 NOTE — Telephone Encounter (Signed)
Advised patient that her book has been located and has been located and placed at the front desk for her to pick up.

## 2020-11-21 NOTE — Telephone Encounter (Signed)
She was grateful for the call.

## 2020-11-22 ENCOUNTER — Ambulatory Visit: Payer: Medicare Other | Admitting: Internal Medicine

## 2020-11-26 ENCOUNTER — Ambulatory Visit: Payer: BC Managed Care – PPO | Admitting: Podiatry

## 2020-11-27 DIAGNOSIS — M1712 Unilateral primary osteoarthritis, left knee: Secondary | ICD-10-CM | POA: Diagnosis not present

## 2020-11-27 DIAGNOSIS — G894 Chronic pain syndrome: Secondary | ICD-10-CM | POA: Diagnosis not present

## 2020-11-27 DIAGNOSIS — M48061 Spinal stenosis, lumbar region without neurogenic claudication: Secondary | ICD-10-CM | POA: Diagnosis not present

## 2020-11-27 DIAGNOSIS — M47816 Spondylosis without myelopathy or radiculopathy, lumbar region: Secondary | ICD-10-CM | POA: Diagnosis not present

## 2020-11-28 ENCOUNTER — Encounter: Payer: Self-pay | Admitting: Hematology

## 2020-12-03 ENCOUNTER — Other Ambulatory Visit: Payer: Self-pay | Admitting: Internal Medicine

## 2020-12-03 ENCOUNTER — Other Ambulatory Visit: Payer: Self-pay | Admitting: Neurology

## 2020-12-03 DIAGNOSIS — L2084 Intrinsic (allergic) eczema: Secondary | ICD-10-CM

## 2020-12-04 ENCOUNTER — Other Ambulatory Visit: Payer: Self-pay | Admitting: *Deleted

## 2020-12-04 DIAGNOSIS — I428 Other cardiomyopathies: Secondary | ICD-10-CM

## 2020-12-05 ENCOUNTER — Ambulatory Visit (INDEPENDENT_AMBULATORY_CARE_PROVIDER_SITE_OTHER): Payer: Medicare Other | Admitting: Internal Medicine

## 2020-12-05 ENCOUNTER — Encounter: Payer: Self-pay | Admitting: Internal Medicine

## 2020-12-05 ENCOUNTER — Other Ambulatory Visit: Payer: Self-pay

## 2020-12-05 VITALS — BP 134/82 | HR 103 | Temp 98.5°F | Ht 61.5 in | Wt 159.2 lb

## 2020-12-05 DIAGNOSIS — G20 Parkinson's disease: Secondary | ICD-10-CM | POA: Diagnosis not present

## 2020-12-05 DIAGNOSIS — G8929 Other chronic pain: Secondary | ICD-10-CM

## 2020-12-05 DIAGNOSIS — E538 Deficiency of other specified B group vitamins: Secondary | ICD-10-CM

## 2020-12-05 DIAGNOSIS — I10 Essential (primary) hypertension: Secondary | ICD-10-CM | POA: Diagnosis not present

## 2020-12-05 DIAGNOSIS — Z23 Encounter for immunization: Secondary | ICD-10-CM | POA: Diagnosis not present

## 2020-12-05 DIAGNOSIS — M5440 Lumbago with sciatica, unspecified side: Secondary | ICD-10-CM

## 2020-12-05 DIAGNOSIS — W19XXXD Unspecified fall, subsequent encounter: Secondary | ICD-10-CM

## 2020-12-05 NOTE — Assessment & Plan Note (Signed)
On Butrans patch

## 2020-12-05 NOTE — Assessment & Plan Note (Signed)
Cont on Verapamil

## 2020-12-05 NOTE — Progress Notes (Signed)
Subjective:  Patient ID: Mallory Klein, female    DOB: Mar 01, 1945  Age: 75 y.o. MRN: [id number]  CC: Follow-up (4 month f/u- Flu shot)   HPI Mallory Klein presents for chronic pain, Parkinson's disease, hypertension follow-up.  She is here with her husband Doc.  Outpatient Medications Prior to Visit  Medication Sig Dispense Refill   buprenorphine (BUTRANS) 7.5 MCG/HR 1 patch once a week.     celecoxib (CELEBREX) 200 MG capsule Take 1 capsule (200 mg total) by mouth 2 (two) times daily as needed for moderate pain. 60 capsule 3   cetirizine (ZYRTEC) 10 MG tablet TAKE 1 TABLET BY MOUTH EVERY DAY 90 tablet 3   Cholecalciferol 25 MCG (1000 UT) tablet Take by mouth.     [employer] SENNA PLUS 8.6-50 MG tablet Take 1 tablet by mouth 2 (two) times daily.     cyanocobalamin (,VITAMIN B-12,) 1000 MCG/ML injection INJECT 1 ML INTO THE SKIN EVERY 14 DAYS 10 mL 3   diclofenac Sodium (VOLTAREN) 1 % GEL Apply 4 g topically daily as needed (pain).     docusate sodium (COLACE) 100 MG capsule Take 100 mg by mouth at bedtime.     hydrOXYzine (VISTARIL) 25 MG capsule Take 25 mg by mouth once.     montelukast (SINGULAIR) 10 MG tablet TAKE 1 TABLET BY MOUTH  DAILY 90 tablet 3   OVER THE COUNTER MEDICATION Take 1 tablet by mouth at bedtime. Lions mane extract     pantoprazole (PROTONIX) 40 MG tablet TAKE 1 TABLET BY MOUTH  DAILY 90 tablet 3   polyethylene glycol (MIRALAX / GLYCOLAX) 17 g packet Take 17 g by mouth daily as needed for mild constipation.     pramipexole (MIRAPEX) 0.125 MG tablet Take 1 tablet (0.125 mg total) by mouth 3 (three) times daily. Follow titration instructions provided separately in writing. 90 tablet 3   verapamil (VERELAN PM) 120 MG 24 hr capsule Take 1 capsule (120 mg total) by mouth at bedtime. 30 capsule 11   zolpidem (AMBIEN) 10 MG tablet TAKE 1/2 TO 1 TABLET BY  MOUTH AT BEDTIME AS NEEDED  FOR SLEEP 90 tablet 1   carbidopa-levodopa (SINEMET CR) 50-200 MG tablet Take 1 tablet by  mouth 2 (two) times daily. (Patient not taking: Reported on 12/05/2020)     No facility-administered medications prior to visit.    ROS: Review of Systems  Constitutional:  Negative for activity change, appetite change, chills, fatigue and unexpected weight change.  HENT:  Negative for congestion, mouth sores and sinus pressure.   Eyes:  Negative for visual disturbance.  Respiratory:  Negative for cough and chest tightness.   Gastrointestinal:  Negative for abdominal pain and nausea.  Genitourinary:  Negative for difficulty urinating, frequency and vaginal pain.  Musculoskeletal:  Negative for back pain and gait problem.  Skin:  Negative for pallor and rash.  Neurological:  Positive for weakness. Negative for dizziness, tremors, numbness and headaches.  Psychiatric/Behavioral:  Negative for confusion, sleep disturbance and suicidal ideas. The patient is nervous/anxious.    Objective:  BP 134/82 (BP Location: Left Arm)   Pulse (!) 103   Temp 98.5 F (36.9 C) (Oral)   Ht 5' 1.5" (1.562 m)   Wt 159 lb 3.2 oz (72.2 kg)   SpO2 97%   BMI 29.59 kg/m   BP Readings from Last 3 Encounters:  12/05/20 134/82  11/19/20 138/82  10/15/20 (!) 154/72    Wt Readings from Last 3 Encounters:  12/05/20 159 lb 3.2 oz (72.2 kg)  11/19/20 160 lb (72.6 kg)  10/15/20 159 lb 12.8 oz (72.5 kg)    Physical Exam Constitutional:      General: She is not in acute distress.    Appearance: She is well-developed. She is obese.  HENT:     Head: Normocephalic.     Right Ear: External ear normal.     Left Ear: External ear normal.     Nose: Nose normal.  Eyes:     General:        Right eye: No discharge.        Left eye: No discharge.     Conjunctiva/sclera: Conjunctivae normal.     Pupils: Pupils are equal, round, and reactive to light.  Neck:     Thyroid: No thyromegaly.     Vascular: No JVD.     Trachea: No tracheal deviation.  Cardiovascular:     Rate and Rhythm: Normal rate and regular  rhythm.     Heart sounds: Normal heart sounds.  Pulmonary:     Effort: No respiratory distress.     Breath sounds: No stridor. No wheezing.  Abdominal:     General: Bowel sounds are normal. There is no distension.     Palpations: Abdomen is soft. There is no mass.     Tenderness: There is no abdominal tenderness. There is no guarding or rebound.  Musculoskeletal:        General: Tenderness present.     Cervical back: Normal range of motion and neck supple. No rigidity.     Right lower leg: No edema.     Left lower leg: No edema.  Lymphadenopathy:     Cervical: No cervical adenopathy.  Skin:    Findings: No erythema or rash.  Neurological:     Cranial Nerves: No cranial nerve deficit.     Motor: Weakness present. No abnormal muscle tone.     Coordination: Coordination abnormal.     Gait: Gait abnormal.  Psychiatric:        Behavior: Behavior normal.        Thought Content: Thought content normal.        Judgment: Judgment normal.  Using a walker Tremor R>L  Lab Results  Component Value Date   WBC 9.0 07/18/2020   HGB 13.6 07/18/2020   HCT 42.1 07/18/2020   PLT 370 07/18/2020   GLUCOSE 99 06/25/2020   CHOL 219 (H) 02/05/2015   TRIG 69.0 02/05/2015   HDL 73.70 02/05/2015   LDLDIRECT 109.8 02/10/2010   LDLCALC 132 (H) 02/05/2015   ALT 2 06/25/2020   AST 11 06/25/2020   NA 138 06/25/2020   K 4.2 06/25/2020   CL 100 06/25/2020   CREATININE 0.85 06/25/2020   BUN 15 06/25/2020   CO2 34 (H) 06/25/2020   TSH 0.67 06/25/2020   INR 0.97 02/07/2014   HGBA1C 5.5 06/25/2020    MR SHOULDER RIGHT WO CONTRAST  Result Date: 08/16/2020 CLINICAL DATA:  Right shoulder pain for months EXAM: MRI OF THE RIGHT SHOULDER WITHOUT CONTRAST TECHNIQUE: Multiplanar, multisequence MR imaging of the shoulder was performed. No intravenous contrast was administered. COMPARISON:  None. FINDINGS: Patient motion degrades image quality limiting evaluation. Rotator cuff: Complete tear of the  supraspinatus tendon with 2.7 cm of retraction. Moderate tendinosis of the infraspinatus tendon. Teres minor tendon is intact. Subscapularis tendon is intact. Muscles: No muscle atrophy or edema. No intramuscular fluid collection or hematoma. Biceps Long Head: Mild tendinosis of  the intra-articular portion of the long head of the biceps tendon. Acromioclavicular Joint: Moderate arthropathy of the acromioclavicular joint. Type II acromion. Trace subacromial/subdeltoid bursal fluid. Glenohumeral Joint: No joint effusion. No chondral defect. Labrum: Grossly intact, but evaluation is limited by lack of intraarticular fluid/contrast. Bones: No fracture or dislocation. No aggressive osseous lesion. Other: No fluid collection or hematoma. IMPRESSION: 1. Complete tear of the supraspinatus tendon with 2.7 cm of retraction. 2. Moderate tendinosis of the infraspinatus tendon. 3. Mild tendinosis of the intra-articular portion of the long head of the biceps tendon. Electronically Signed   By: Kathreen Devoid   On: 08/16/2020 07:06    Assessment & Plan:   Problem List Items Addressed This Visit     B12 deficiency    On B12 inj      Essential hypertension    Cont on Verapamil      Fall    Recurrent falls Fall prevention discussed      Low back pain     On Butrans patch      Parkinson disease ([place])    The pt switched to Dr Rexene Alberts. On Mirapex, off Sinemet      Other Visit Diagnoses     Flu vaccine need    -  Primary   Relevant Orders   Flu Vaccine QUAD High Dose(Fluad) (Completed)         Follow-up: Return in about 3 months (around 03/07/2021) for a follow-up visit.  Walker Kehr, MD

## 2020-12-05 NOTE — Patient Instructions (Signed)

## 2020-12-05 NOTE — Assessment & Plan Note (Signed)
On B12 inj 

## 2020-12-05 NOTE — Assessment & Plan Note (Signed)
The pt switched to Dr Rexene Alberts. On Mirapex, off Sinemet

## 2020-12-05 NOTE — Assessment & Plan Note (Signed)
Recurrent falls Fall prevention discussed

## 2020-12-10 ENCOUNTER — Other Ambulatory Visit: Payer: Self-pay

## 2020-12-10 ENCOUNTER — Ambulatory Visit (HOSPITAL_BASED_OUTPATIENT_CLINIC_OR_DEPARTMENT_OTHER): Payer: Medicare Other | Attending: Cardiology | Admitting: Cardiovascular Disease

## 2020-12-10 DIAGNOSIS — Z79899 Other long term (current) drug therapy: Secondary | ICD-10-CM | POA: Diagnosis not present

## 2020-12-10 DIAGNOSIS — G4733 Obstructive sleep apnea (adult) (pediatric): Secondary | ICD-10-CM | POA: Insufficient documentation

## 2020-12-10 DIAGNOSIS — G4736 Sleep related hypoventilation in conditions classified elsewhere: Secondary | ICD-10-CM | POA: Insufficient documentation

## 2020-12-10 DIAGNOSIS — Z791 Long term (current) use of non-steroidal anti-inflammatories (NSAID): Secondary | ICD-10-CM | POA: Diagnosis not present

## 2020-12-18 ENCOUNTER — Telehealth: Payer: Self-pay | Admitting: Neurology

## 2020-12-18 DIAGNOSIS — Z9181 History of falling: Secondary | ICD-10-CM

## 2020-12-18 DIAGNOSIS — K5909 Other constipation: Secondary | ICD-10-CM

## 2020-12-18 DIAGNOSIS — G20 Parkinson's disease: Secondary | ICD-10-CM

## 2020-12-18 DIAGNOSIS — R55 Syncope and collapse: Secondary | ICD-10-CM

## 2020-12-18 MED ORDER — PRAMIPEXOLE DIHYDROCHLORIDE 0.125 MG PO TABS: 0.3750 mg | ORAL_TABLET | Freq: Three times a day (TID) | ORAL | 3 refills | Status: DC

## 2020-12-18 NOTE — Telephone Encounter (Signed)
Pt called wanting to know if she needs to refill the pramipexole (MIRAPEX) 0.125 MG tablet if it gives her the shakes. States she is getting ready to run out. Pt requesting a call back.

## 2020-12-18 NOTE — Telephone Encounter (Signed)
Please call patient back.  It sounds like she has been able to tolerate the pramipexole 0.125 mg strength.  I would like to increase her pramipexole at this juncture.  She can increase it to 2 pills 3 times daily for now and after a week, she can increase it to 3 pills 3 times daily and maintain at that level.  I have adjusted her prescription in that regard.

## 2020-12-18 NOTE — Telephone Encounter (Signed)
I called pt she has been taking mirapex 0.125mg  tabs TID for 3 wks now.  She started taking 1 tab po TID (did not titirate).  She has tolerated.  She states that she is having tremors/ shaking, which has progressively gotten worse over this time.  She is having back pain which see sPM and on patch/ and started on tylenol 8hour by pcp.  Not sure if caused from the shaking that she is doing.  Also has seen cardiology and will be starting back on her cpap.  She has one more pill left of the mirapex 0.125mg  for tonight and questioning if will need more or change treatment. Please advise.  Shaking so back cannot write, hard time getting out of chair (these things she was doing previously).

## 2020-12-18 NOTE — Telephone Encounter (Signed)
I called patient back I relayed Dr. Guadelupe Sabin recommendation of increasing her Mirapex 0.125 mg tablets to taking 2 tablets 3 times a day for 1 week then increase to 3 tablets 3 times a day and maintain it there .patient will do this and let us know how she does and will call us back sooner if need needs to she appreciated call back.

## 2020-12-23 ENCOUNTER — Telehealth: Payer: Self-pay | Admitting: Internal Medicine

## 2020-12-23 NOTE — Telephone Encounter (Signed)
Patient requesting call to discuss possible documents needed for upcoming appt 12-31-2020

## 2020-12-23 NOTE — Telephone Encounter (Signed)
Called pt she states she been having problem w/ her eye sight... Blurred vision. She states she also know that she has glaucoma, but when she call her eye doctor he is not able to see her until January. Was told to call her PCP. Inform pt since she has appt already schedule for 11/8, to keep the appt. MD did not have any opening to move appt up. If problems get worse before ov to go to ER.Pt understood.Marland KitchenJohny Chess

## 2020-12-25 ENCOUNTER — Other Ambulatory Visit (HOSPITAL_COMMUNITY): Payer: Self-pay

## 2020-12-25 ENCOUNTER — Encounter: Payer: Self-pay | Admitting: Hematology

## 2020-12-25 DIAGNOSIS — M1712 Unilateral primary osteoarthritis, left knee: Secondary | ICD-10-CM | POA: Diagnosis not present

## 2020-12-25 DIAGNOSIS — M48061 Spinal stenosis, lumbar region without neurogenic claudication: Secondary | ICD-10-CM | POA: Diagnosis not present

## 2020-12-25 DIAGNOSIS — M47816 Spondylosis without myelopathy or radiculopathy, lumbar region: Secondary | ICD-10-CM | POA: Diagnosis not present

## 2020-12-25 DIAGNOSIS — G894 Chronic pain syndrome: Secondary | ICD-10-CM | POA: Diagnosis not present

## 2020-12-25 MED ORDER — BUPRENORPHINE 7.5 MCG/HR TD PTWK
MEDICATED_PATCH | TRANSDERMAL | 0 refills | Status: DC
  Filled 2020-12-25: qty 4, 28d supply, fill #0

## 2020-12-27 ENCOUNTER — Other Ambulatory Visit (HOSPITAL_COMMUNITY): Payer: Self-pay

## 2020-12-31 ENCOUNTER — Ambulatory Visit: Payer: Medicare Other | Admitting: Internal Medicine

## 2020-12-31 ENCOUNTER — Telehealth (HOSPITAL_COMMUNITY): Payer: Self-pay | Admitting: *Deleted

## 2020-12-31 ENCOUNTER — Other Ambulatory Visit (HOSPITAL_COMMUNITY): Payer: Self-pay | Admitting: *Deleted

## 2020-12-31 DIAGNOSIS — H527 Unspecified disorder of refraction: Secondary | ICD-10-CM | POA: Diagnosis not present

## 2020-12-31 DIAGNOSIS — H0289 Other specified disorders of eyelid: Secondary | ICD-10-CM | POA: Diagnosis not present

## 2020-12-31 DIAGNOSIS — H04123 Dry eye syndrome of bilateral lacrimal glands: Secondary | ICD-10-CM | POA: Diagnosis not present

## 2020-12-31 DIAGNOSIS — Z01812 Encounter for preprocedural laboratory examination: Secondary | ICD-10-CM

## 2020-12-31 DIAGNOSIS — Z79899 Other long term (current) drug therapy: Secondary | ICD-10-CM | POA: Diagnosis not present

## 2020-12-31 DIAGNOSIS — Z961 Presence of intraocular lens: Secondary | ICD-10-CM | POA: Diagnosis not present

## 2020-12-31 DIAGNOSIS — H04129 Dry eye syndrome of unspecified lacrimal gland: Secondary | ICD-10-CM | POA: Diagnosis not present

## 2020-12-31 NOTE — Telephone Encounter (Signed)
Attempted to call patient regarding upcoming cardiac MRI appointment and to see about getting blood work prior to appointment.  Left message on voicemail with name and callback number  Gordy Clement RN Navigator [facility] [phone] Office [phone] Cell

## 2021-01-01 ENCOUNTER — Other Ambulatory Visit (HOSPITAL_COMMUNITY): Payer: Self-pay | Admitting: *Deleted

## 2021-01-01 ENCOUNTER — Ambulatory Visit: Payer: Medicare Other | Admitting: Emergency Medicine

## 2021-01-01 ENCOUNTER — Telehealth (HOSPITAL_COMMUNITY): Payer: Self-pay | Admitting: *Deleted

## 2021-01-01 DIAGNOSIS — Z01812 Encounter for preprocedural laboratory examination: Secondary | ICD-10-CM

## 2021-01-01 NOTE — Telephone Encounter (Signed)
Patient returning call regarding upcoming cardiac imaging study; pt verbalizes understanding of appt date/time, parking situation and where to check in, ordered, and verified current allergies; name and call back number provided for further questions should they arise  Mallory Clement RN [facility] [phone] office [phone] cell  Patient aware to get blood work prior to MRI appointment.

## 2021-01-02 ENCOUNTER — Other Ambulatory Visit: Payer: Self-pay

## 2021-01-02 ENCOUNTER — Ambulatory Visit (HOSPITAL_COMMUNITY)
Admission: RE | Admit: 2021-01-02 | Discharge: 2021-01-02 | Disposition: A | Discharge status: Home / Self Care | Payer: Medicare Other | Source: Ambulatory Visit | Attending: Cardiology | Admitting: Cardiology

## 2021-01-02 DIAGNOSIS — I428 Other cardiomyopathies: Secondary | ICD-10-CM | POA: Insufficient documentation

## 2021-01-02 LAB — CBC
Hematocrit: 39.3 % (ref 34.0–46.6)
Hemoglobin: 13.2 g/dL (ref 11.1–15.9)
MCH: 28.5 pg (ref 26.6–33.0)
MCHC: 33.6 g/dL (ref 31.5–35.7)
MCV: 85 fL (ref 79–97)
Platelets: 408 10*3/uL (ref 150–450)
RBC: 4.63 x10E6/uL (ref 3.77–5.28)
RDW: 13.7 % (ref 11.7–15.4)
WBC: 8.8 10*3/uL (ref 3.4–10.8)

## 2021-01-02 IMAGING — MR MR CARD MORPHOLOGY WO/W CM
45 of 48 series · 45 of 48 positions shown · IV contrast (Gadavist)
Comparison: none

CLINICAL DATA: Evaluate for noncompaction

EXAM:
CARDIAC MRI
TECHNIQUE: The patient was scanned on a 1.5 Tesla Siemens magnet. A dedicated
cardiac coil was used. Functional imaging was done using Fiesta
sequences. [DATE], and 4 chamber views were done to assess for RWMA's.
Modified MIA MAJA rule using a short axis stack was used to
calculate an ejection fraction on a dedicated work station using
Circle software. The patient received 10 cc of Gadavist. After 10
minutes inversion recovery sequences were used to assess for
infiltration and scar tissue.
CONTRAST:  10 cc  of Gadavist

[Series 4: t2_haste_db_tra_bh · axial · 8.0mm · 1.41mm/px · 1 of 16 slices shown]
[im 1/16]
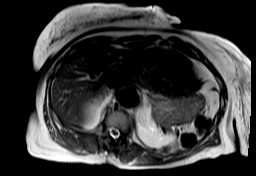

[Series 10: bSSFP · sagittal · 8.0mm · 1.61mm/px · 1 of 25 slices shown (1 of 20)]
[im 1/25]
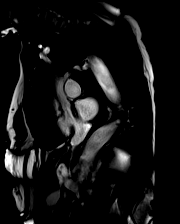

[Series 11: bSSFP · sagittal · 8.0mm · 1.61mm/px · 1 of 25 slices shown (2 of 20)]
[im 1/25]
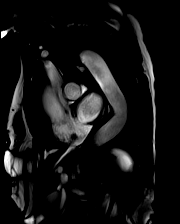

[Series 12: bSSFP · sagittal · 8.0mm · 1.61mm/px · 1 of 25 slices shown (3 of 20)]
[im 1/25]
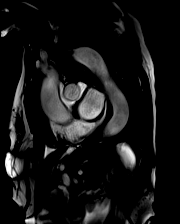

[Series 13: bSSFP · sagittal · 8.0mm · 1.61mm/px · 1 of 25 slices shown (4 of 20)]
[im 1/25]
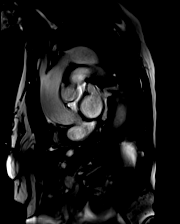

[Series 14: bSSFP · sagittal · 8.0mm · 1.61mm/px · 1 of 25 slices shown (5 of 20)]
[im 1/25]
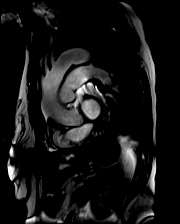

[Series 15: bSSFP · sagittal · 8.0mm · 1.61mm/px · 1 of 25 slices shown (6 of 20)]
[im 1/25]
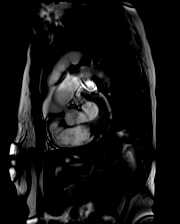

[Series 16: bSSFP · sagittal · 8.0mm · 1.61mm/px · 1 of 25 slices shown (7 of 20)]
[im 1/25]
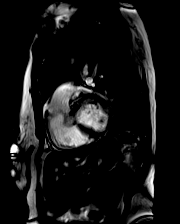

[Series 17: bSSFP · sagittal · 8.0mm · 1.61mm/px · 1 of 25 slices shown (8 of 20)]
[im 1/25]
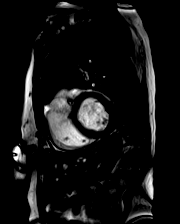

[Series 18: bSSFP · sagittal · 8.0mm · 1.61mm/px · 1 of 25 slices shown (9 of 20)]
[im 1/25]
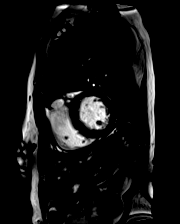

[Series 19: bSSFP · sagittal · 8.0mm · 1.61mm/px · 1 of 23 slices shown (10 of 20)]
[im 1/23]
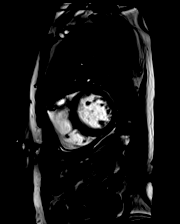

[Series 20: bSSFP · sagittal · 8.0mm · 1.61mm/px · 1 of 25 slices shown (11 of 20)]
[im 1/25]
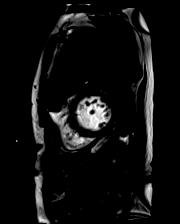

[Series 21: bSSFP · sagittal · 8.0mm · 1.61mm/px · 1 of 25 slices shown (12 of 20)]
[im 1/25]
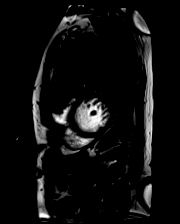

[Series 22: bSSFP · sagittal · 8.0mm · 1.61mm/px · 1 of 25 slices shown (13 of 20)]
[im 1/25]
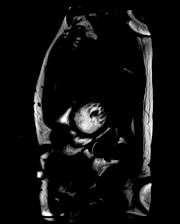

[Series 23: bSSFP · sagittal · 8.0mm · 1.61mm/px · 1 of 25 slices shown (14 of 20)]
[im 1/25]
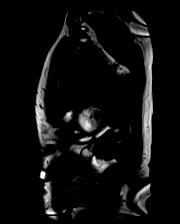

[Series 24: bSSFP · sagittal · 8.0mm · 1.61mm/px · 1 of 25 slices shown (15 of 20)]
[im 1/25]
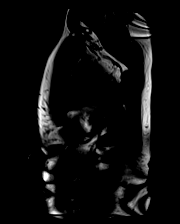

[Series 25: bSSFP · sagittal · 8.0mm · 1.61mm/px · 1 of 25 slices shown (16 of 20)]
[im 1/25]
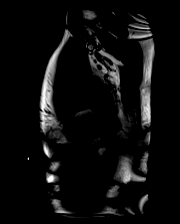

[Series 26: bSSFP · sagittal · 8.0mm · 1.61mm/px · 1 of 25 slices shown (17 of 20)]
[im 1/25]
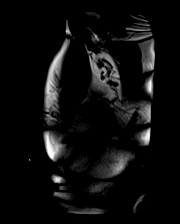

[Series 27: (id)_long_t1 · sagittal · 8.0mm · 1.56mm/px · 1 of 24 slices shown]
[im 1/24]
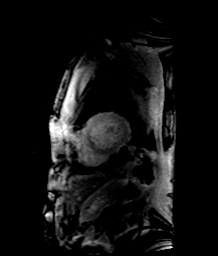

[Series 28: (id)_long_t1_moco · sagittal · 8.0mm · 1.56mm/px · 1 of 24 slices shown]
[im 1/24]
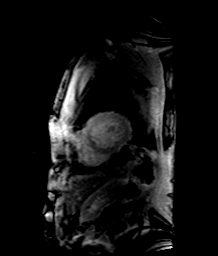

[Series 29: (id)_long_t1_moco_t1 · 1 of 3 slices shown (1 of 2)]
[im 1/3]
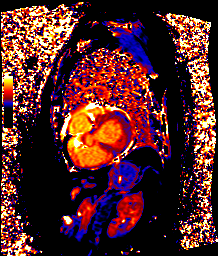

[Series 29: (id)_long_t1_moco_t1 · sagittal · 8.0mm · 1.56mm/px · 1 of 3 slices shown (2 of 2)]
[im 1/3]
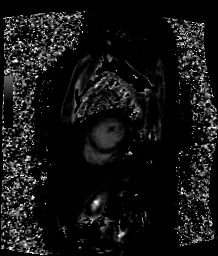

[Series 31: (id)_trufi · sagittal · 8.0mm · 2.08mm/px · 1 of 9 slices shown]
[im 1/9]
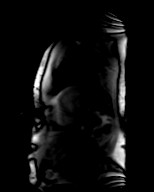

[Series 32: (id)_trufi_moco · sagittal · 8.0mm · 2.08mm/px · 1 of 9 slices shown]
[im 1/9]
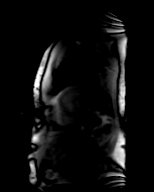

[Series 33: (id)_trufi_moco_t2 · sagittal · 8.0mm · 2.08mm/px · 1 of 3 slices shown]
[im 1/3]
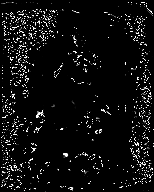

[Series 35: bSSFP · axial · 6.0mm · 1.41mm/px · 1 of 25 slices shown (18 of 20)]
[im 1/25]
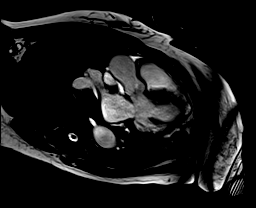

[Series 36: bSSFP · coronal · 6.0mm · 1.41mm/px · 1 of 25 slices shown (19 of 20)]
[im 1/25]
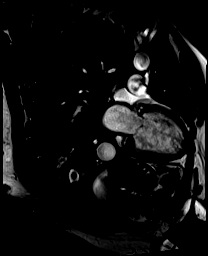

[Series 37: bSSFP · oblique · 6.0mm · 1.41mm/px · 1 of 25 slices shown (20 of 20)]
[im 1/25]
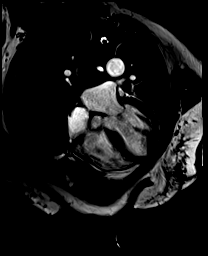

[Series 38: cine_trufi_short axis_cs_2_shot · sagittal · 8.0mm · 1.48mm/px · 1 of 25 slices shown (1 of 15)]
[im 1/25]
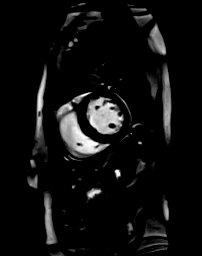

[Series 38: cine_trufi_short axis_cs_2_shot · sagittal · 8.0mm · 1.48mm/px · 1 of 21 slices shown (2 of 15)]
[im 1/21]
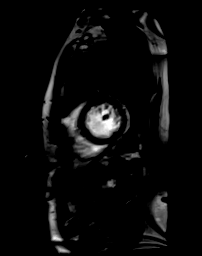

[Series 38: cine_trufi_short axis_cs_2_shot · sagittal · 8.0mm · 1.48mm/px · 1 of 25 slices shown (3 of 15)]
[im 1/25]
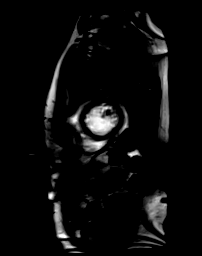

[Series 38: cine_trufi_short axis_cs_2_shot · sagittal · 8.0mm · 1.48mm/px · 1 of 25 slices shown (4 of 15)]
[im 1/25]
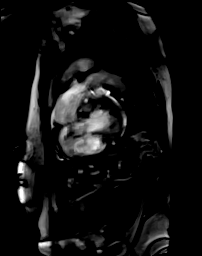

[Series 38: cine_trufi_short axis_cs_2_shot · sagittal · 8.0mm · 1.48mm/px · 1 of 25 slices shown (5 of 15)]
[im 1/25]
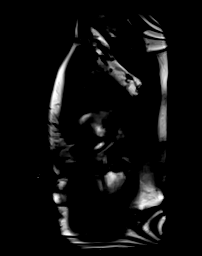

[Series 38: cine_trufi_short axis_cs_2_shot · sagittal · 8.0mm · 1.48mm/px · 1 of 25 slices shown (6 of 15)]
[im 1/25]
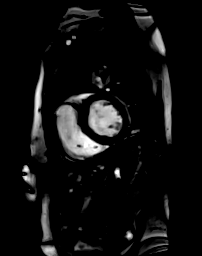

[Series 38: cine_trufi_short axis_cs_2_shot · sagittal · 8.0mm · 1.48mm/px · 1 of 25 slices shown (7 of 15)]
[im 1/25]
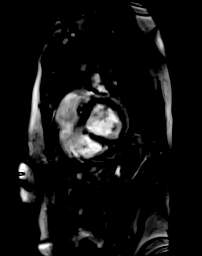

[Series 38: cine_trufi_short axis_cs_2_shot · sagittal · 8.0mm · 1.48mm/px · 1 of 25 slices shown (8 of 15)]
[im 1/25]
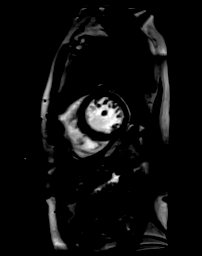

[Series 38: cine_trufi_short axis_cs_2_shot · sagittal · 8.0mm · 1.48mm/px · 1 of 25 slices shown (9 of 15)]
[im 1/25]
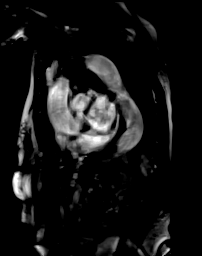

[Series 38: cine_trufi_short axis_cs_2_shot · sagittal · 8.0mm · 1.48mm/px · 1 of 25 slices shown (10 of 15)]
[im 1/25]
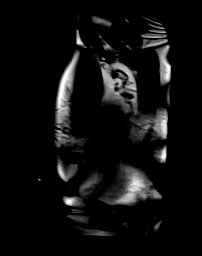

[Series 38: cine_trufi_short axis_cs_2_shot · sagittal · 8.0mm · 1.48mm/px · 1 of 23 slices shown (11 of 15)]
[im 1/23]
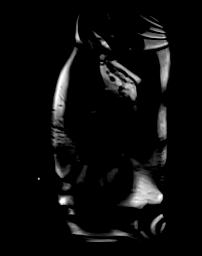

[Series 38: cine_trufi_short axis_cs_2_shot · sagittal · 8.0mm · 1.48mm/px · 1 of 25 slices shown (12 of 15)]
[im 1/25]
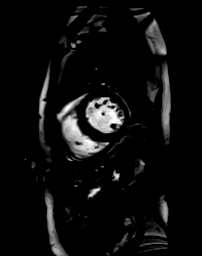

[Series 38: cine_trufi_short axis_cs_2_shot · sagittal · 8.0mm · 1.48mm/px · 1 of 25 slices shown (13 of 15)]
[im 1/25]
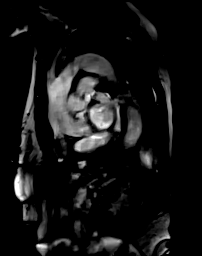

[Series 38: cine_trufi_short axis_cs_2_shot · sagittal · 8.0mm · 1.48mm/px · 1 of 25 slices shown (14 of 15)]
[im 1/25]
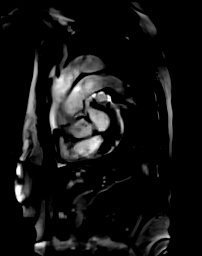

[Series 38: cine_trufi_short axis_cs_2_shot · sagittal · 8.0mm · 1.48mm/px · 1 of 25 slices shown (15 of 15)]
[im 1/25]
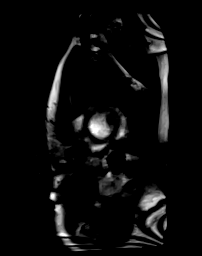

[Series 39: pre short axis · sagittal · non-contrast · 8.0mm · 2.25mm/px · 1 of 10 slices shown (1 of 2)]
[im 1/10]
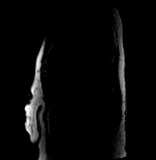

[Series 40: pre short axis · sagittal · non-contrast · 8.0mm · 2.25mm/px · 1 of 10 slices shown (2 of 2)]
[im 1/10]
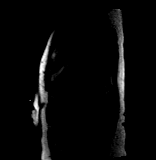

[45 of 48 positions shown; findings below may reference images not displayed]

FINDINGS: Left ventricle:

-Hypertrabeculation at apex

-Apical displacement of papillary muscle

-Mild hypertrophy

-Normal size

-Normal systolic function

-Normal ECV (27%)

-No LGE

LV EF:  55% (Normal 56-78%)

Absolute volumes:

LV EDV: 143mL (Normal 52-141 mL)

LV ESV: 64mL (Normal 13-51 mL)

LV SV: 79mL (Normal 33-97 mL)

CO: 4.8L/min (Normal 2.7-6.0 L/min)

Indexed volumes:

LV EDV: 81mL/sq-m (Normal 41-81 mL/sq-m)

LV ESV: 36mL/sq-m (Normal 12-21 mL/sq-m)

LV SV: 45mL/sq-m (Normal 26-56 mL/sq-m)

CI: 2.7L/min/sq-m (Normal 1.8-3.8 L/min/sq-m)

Right ventricle: Normal size and systolic function

RV EF: 58% (Normal 47-80%)

Absolute volumes:

RV EDV: 134mL (Normal 58-154 mL)

RV ESV: 57mL (Normal 12-68 mL)

RV SV: 78mL (Normal 35-98 mL)

CO: 4.7L/min (Normal 2.7-6 L/min)

Indexed volumes:

RV EDV: 76mL/sq-m (Normal 48-87 mL/sq-m)

RV ESV: 32mL/sq-m (Normal 11-28 mL/sq-m)

RV SV: 44mL/sq-m (Normal 27-57 mL/sq-m)

CI: 2.7L/min/sq-m (Normal 1.8-3.8 L/min/sq-m)

Left atrium: Mild enlargement

Right atrium: Normal size

Mitral valve: No regurgitation

Aortic valve: No regurgitation

Tricuspid valve: No regurgitation

Pulmonic valve: Mild regurgitation

Aorta: Normal proximal ascending aorta

Pericardium: Normal
IMPRESSION: 1. Hypertrabeculation of LV apex, but not consistent with LV
noncompaction cardiomyopathy given normal systolic function

2.  No late gadolinium enhancement to suggest myocardial scar

3. Normal LV size, mild hypertrophy, and normal systolic function
(EF 55%)

4.  Normal RV size and systolic function (EF 58%)

## 2021-01-02 MED ORDER — GADOBUTROL 1 MMOL/ML IV SOLN
10.0000 mL | Freq: Once | INTRAVENOUS | Status: AC | PRN
  Administered 2021-01-02: 10 mL via INTRAVENOUS

## 2021-01-05 ENCOUNTER — Encounter (HOSPITAL_BASED_OUTPATIENT_CLINIC_OR_DEPARTMENT_OTHER): Payer: Self-pay | Admitting: Cardiovascular Disease

## 2021-01-05 NOTE — Procedures (Signed)
Patient Name: Mallory Klein, Mallory Klein Date: 12/10/2020 Gender: Female D.O.B: [birth year] Age (years): 74 Referring Provider: Oswaldo Milian Height (inches): 62 Interpreting Physician: Shelva Majestic MD, ABSM Weight (lbs): 159 RPSGT: Laren Everts BMI: 30 MRN: [id number] Neck Size: 13.00  CLINICAL INFORMATION Sleep Study Type: NPSG  Indication for sleep study: Hypertension, OSA, Snoring  Epworth Sleepiness Score: 14  Most recent polysomnogram dated 06/30/2016 revealed an AHI of 24.3/h and RDI of 28.3/h.  SLEEP STUDY TECHNIQUE As per the AASM Manual for the Scoring of Sleep and Associated Events v2.3 (April 2016) with a hypopnea requiring 4% desaturations.  The channels recorded and monitored were frontal, central and occipital EEG, electrooculogram (EOG), submentalis EMG (chin), nasal and oral airflow, thoracic and abdominal wall motion, anterior tibialis EMG, snore microphone, electrocardiogram, and pulse oximetry.  MEDICATIONS Medications self-administered by patient taken the night of the study : OXYCODONE HCL, DEXILANT, ZYRTEC, OXYBUTYNIN, VERAPAMIL ER, VITAMIN D3, PRAMIPEXOLE, CELECOXIB, VERAPAMIL, CETIRIZINE, ZOLPIDEM TARTRATE  SLEEP ARCHITECTURE The study was initiated at 11:20:58 PM and ended at 5:28:53 AM.  Sleep onset time was 52.4 minutes and the sleep efficiency was 22.3%%. The total sleep time was 82 minutes.  Stage REM latency was N/A minutes.  The patient spent 51.8%% of the night in stage N1 sleep, 48.2%% in stage N2 sleep, 0.0%% in stage N3 and 0% in REM.  Alpha intrusion was absent.  Supine sleep was 8.54%.  RESPIRATORY PARAMETERS The overall apnea/hypopnea index (AHI) was 8.8 per hour. The respiratory disturbance index (RDI) was 22.0/h. There were 0 total apneas, including 0 obstructive, 0 central and 0 mixed apneas. There were 12 hypopneas and 18 RERAs.  The AHI during Stage REM sleep was N/A per hour.  AHI while supine was 17.1  per hour.  The mean oxygen saturation was 92.8%. The minimum SpO2 during sleep was 87.0%.  Snoring was noted during this study.  CARDIAC DATA The 2 lead EKG demonstrated sinus rhythm. The mean heart rate was 73.8 beats per minute. Other EKG findings include: PVCs.  LEG MOVEMENT DATA The total PLMS were 0 with a resulting PLMS index of 0.0. Associated arousal with leg movement index was 0.0 .  IMPRESSIONS - Mild obstructive sleep apnea occurred during this study (AHI 8.8/h; RDI 22.0/h); events were moderate with supine sleep (AHI 17.1/h).  However, there was very poor sleep efficiency at only 22.3% and absent REM sleep.  - Mild oxygen desaturation to nadir of 87.0%. - Minimal snoring was audible during this study. - EKG findings include PVCs. - Clinically significant periodic limb movements did not occur during sleep. No significant associated arousals.  DIAGNOSIS - Obstructive Sleep Apnea (G47.33) - Nocturnal Hypoxemia (G47.36)  RECOMMENDATIONS - Therapeutic CPAP titration to determine optimal pressure required to alleviate sleep disordered breathing. Recommend an initial trial of Auto-PAP with EPR of 3 at 6 - 18 cm of water. - Effort should be made to optimize nasal and oropharyngeal patency. - Avoid alcohol, sedatives and other CNS depressants that may worsen sleep apnea and disrupt normal sleep architecture. - Sleep hygiene should be reviewed to assess factors that may improve sleep quality. - Weight management (BMI 30) and regular exercise should be initiated or continued if appropriate.  [Electronically signed] 01/05/2021 12:13 PM  Shelva Majestic MD, [facility], [place], American Board of Sleep Medicine   NPI: [id number]  [place] PH: [phone]   FX: [phone] [place]  SLEEP MEDICINE

## 2021-01-05 NOTE — Progress Notes (Signed)
Cardiology Office Note:    Date:  01/07/2021   ID:  YATZARY MERRIWEATHER, DOB 01-23-1946, MRN [id number]  PCP:  Cassandria Anger, MD  Cardiologist:  None  Electrophysiologist:  None   Referring MD: Cassandria Anger, MD   No chief complaint on file.    History of Present Illness:    Mallory Klein is a 75 y.o. female with a hx of Parkinson's disease, OSA, asthma, hypertension who presents for follow-up.  She was referred by Dr. Alain Marion for evaluation of syncope, initially seen on 10/15/2020.  She reports she has had issues chronically with falls due to her Parkinson's but over the last 2 months has had spells where she thinks she has passed out.  First happened about 48-months ago.  Reports she was sitting on the couch and got up to go to the kitchen and then next thing she knew she was lying on the ground.  She hit her head and injured her shoulder.  Has had 2 more falls since that time.  She does not remember what happened with these.  Denies recent position change reports she was walking in her bedroom and next thing she knew she was on the ground.  She denies any palpitations, chest pain, dyspnea, or edema.  Does report intermittent lightheadedness that she has attributed to vertigo.  Has a history of OSA but has not been using CPAP, no longer has machine.  Denies any smoking history.  No history of heart disease in her immediate family.  Zio patch x14 days on 11/12/2020 showed 1 episode of NSVT lasting 7 beats.  Echocardiogram on 11/20/2020 showed prominent apical trabeculations, EF 55 to 60%, moderate LVH, grade 1 diastolic dysfunction, normal RV function, no significant valvular disease.  Cardiac MRI on 01/02/2021 showed hyper trabeculation of LV apex but not consistent with noncompaction cardiomyopathy given normal systolic function (EF 63%), no LGE.  Since last clinic visit, she reports has been doing okay.  No recent syncopal episodes.  Does report some lightheadedness if stands  too quickly.  Denies any chest pain, dyspnea, or palpitations.  Does report intermittent lower extremity edema.   Past Medical History:  Diagnosis Date   Abnormal CBC 02/05/2015   [name] 12/16 new: normocytic anemia and thrombocytosis   Adjustment disorder with mixed anxiety and depressed mood 02/15/2007   Chronic  Chronic pain Grief, stress Effexor XR  Dad died in March 01, 2023   Asthma    Diverticulosis of colon (without mention of hemorrhage)    Esophageal stricture    Essential hypertension 01/26/2007   Chronic Verapamil    Falls    "I blackout"   GERD (gastroesophageal reflux disease) 02/15/2007   Chronic     Heart murmur    Hyperglycemia 03/02/2011   Mild     Insomnia disorder 12/08/2018   10/20 Carbid/Lev dose was increased - c/o hard time falling asleep (6 am) getting up at 12 am, poor sleep. Try Temazepam 15-30 mg at 11-1:30 pm    Internal hemorrhoids without mention of complication    Iron deficiency anemia 04/23/2017   Mild neurocognitive disorder, likely due to Parkinson's disease 02/09/2019   OSA (obstructive sleep apnea) 02/14/2010   In lab study (May 2018): AHI 25. Only had 45 minutes of sleep secondary to frequent awakenings secondary to sleep apnea. autocpap 5-15 cm water.    Osteoarthritis    Parkinson disease ([place]) February 28, 2014   2015 2017  Primidone - d/c 2019 Parkinson's: [facility] IR. Dr [name]  Postherpetic trigeminal neuralgia 01/26/2007   Qualifier: Diagnosis of  By: Marca Ancona RMA, [name]     Shortness of breath dyspnea    with allergies   Thrombocytosis 05/24/2017   Urinary incontinence 01/26/2007   Chronic  10/17 Detrol LA    Vitamin D deficiency 01/26/2019    Past Surgical History:  Procedure Laterality Date   ABDOMINAL HYSTERECTOMY     BREAST CYST EXCISION Left 1980's   LUMBAR LAMINECTOMY/DECOMPRESSION MICRODISCECTOMY Bilateral 02/20/2014   Procedure: LUMBAR TWO THREE, LUMBAR THREE FOUR LUMBAR LAMINECTOMY/DECOMPRESSION MICRODISCECTOMY 2 LEVELS;  Surgeon: Eustace Moore, MD;  Location: [facility];  Service: Neurosurgery;  Laterality: Bilateral;   SPLENECTOMY     TIBIA FRACTURE SURGERY Right    TIBIA FRACTURE SURGERY Left    TOTAL KNEE ARTHROPLASTY Right 2013    Current Medications: Current Meds  Medication Sig   buprenorphine (BUTRANS) 7.5 MCG/HR 1 patch once a week.   celecoxib (CELEBREX) 200 MG capsule Take 1 capsule (200 mg total) by mouth 2 (two) times daily as needed for moderate pain.   cetirizine (ZYRTEC) 10 MG tablet TAKE 1 TABLET BY MOUTH EVERY DAY   Cholecalciferol 25 MCG (1000 UT) tablet Take by mouth.   [facility] SENNA PLUS 8.6-50 MG tablet Take 1 tablet by mouth 2 (two) times daily.   cyanocobalamin (,VITAMIN B-12,) 1000 MCG/ML injection INJECT 1 ML INTO THE SKIN EVERY 14 DAYS   diclofenac Sodium (VOLTAREN) 1 % GEL Apply 4 g topically daily as needed (pain).   docusate sodium (COLACE) 100 MG capsule Take 100 mg by mouth at bedtime.   hydrOXYzine (VISTARIL) 25 MG capsule Take 25 mg by mouth once.   montelukast (SINGULAIR) 10 MG tablet TAKE 1 TABLET BY MOUTH  DAILY   OVER THE COUNTER MEDICATION Take 1 tablet by mouth at bedtime. Lions mane extract   pantoprazole (PROTONIX) 40 MG tablet TAKE 1 TABLET BY MOUTH  DAILY   polyethylene glycol (MIRALAX / GLYCOLAX) 17 g packet Take 17 g by mouth daily as needed for mild constipation.   pramipexole (MIRAPEX) 0.125 MG tablet Take 3 tablets (0.375 mg total) by mouth 3 (three) times daily. Follow titration instructions provided separately.   verapamil (VERELAN PM) 120 MG 24 hr capsule Take 1 capsule (120 mg total) by mouth at bedtime.   zolpidem (AMBIEN) 10 MG tablet TAKE 1/2 TO 1 TABLET BY  MOUTH AT BEDTIME AS NEEDED  FOR SLEEP     Allergies:   Ace inhibitors, Benicar [olmesartan], Aspirin, and Citalopram hydrobromide   Social History   Socioeconomic History   Marital status: Married    Spouse name: Not on file   Number of children: 2   Years of education: 14   Highest education level:  Associate degree: occupational, Hotel manager, or vocational program  Occupational History   Occupation: Retired    Comment: Network engineer, Youth worker   Tobacco Use   Smoking status: Never   Smokeless tobacco: Never  Vaping Use   Vaping Use: Never used  Substance and Sexual Activity   Alcohol use: Yes    Alcohol/week: 2.0 standard drinks    Types: 2 Glasses of wine per week    Comment: wine on occ    Drug use: No   Sexual activity: Not Currently  Other Topics Concern   Not on file  Social History Narrative   Drink tea and coffee on occ   Right handed   1 story    Lives with spouse   Social Determinants  of Health   Financial Resource Strain: Low Risk    Difficulty of Paying Living Expenses: Not hard at all  Food Insecurity: No Food Insecurity   Worried About [place] in the Last Year: Never true   [place] in the Last Year: Never true  Transportation Needs: No Transportation Needs   Lack of Transportation (Medical): No   Lack of Transportation (Non-Medical): No  Physical Activity: Inactive   Days of Exercise per Week: 0 days   Minutes of Exercise per Session: 0 min  Stress: No Stress Concern Present   Feeling of Stress : Not at all  Social Connections: Socially Integrated   Frequency of Communication with Friends and Family: More than three times a week   Frequency of Social Gatherings with Friends and Family: More than three times a week   Attends Religious Services: More than 4 times per year   Active Member of [employer] or Organizations: Yes   Attends Music therapist: More than 4 times per year   Marital Status: Married     Family History: The patient's family history includes Asthma in her mother; Dementia in her mother; Diabetes in her mother; Diabetes type II in her mother; Hypertension in her mother; Mental illness in her father; Prostate cancer in her father; Stroke in her brother; Suicidality in her son. There is no history of Colon  cancer or Rectal cancer.  ROS:   Please see the history of present illness.     All other systems reviewed and are negative.  EKGs/Labs/Other Studies Reviewed:    The following studies were reviewed today:   EKG:  EKG is  ordered today.  The ekg ordered today demonstrates normal sinus rhythm, rate 77, nonspecific T wave flattening, poor R wave progression, left axis deviation  Recent Labs: 06/25/2020: ALT 2; BUN 15; Creatinine, Ser 0.85; Potassium 4.2; Sodium 138; TSH 0.67 01/01/2021: Hemoglobin 13.2; Platelets 408  Recent Lipid Panel    Component Value Date/Time   CHOL 219 (H) 02/05/2015 1434   TRIG 69.0 02/05/2015 1434   HDL 73.70 02/05/2015 1434   CHOLHDL 3 02/05/2015 1434   VLDL 13.8 02/05/2015 1434   LDLCALC 132 (H) 02/05/2015 1434   LDLDIRECT 109.8 02/10/2010 1031    Physical Exam:    VS:  BP (!) 158/88   Pulse 95   Ht 5' 1.5" (1.562 m)   Wt 156 lb 9.6 oz (71 kg)   SpO2 96%   BMI 29.11 kg/m     Wt Readings from Last 3 Encounters:  01/07/21 156 lb 9.6 oz (71 kg)  12/10/20 159 lb (72.1 kg)  12/05/20 159 lb 3.2 oz (72.2 kg)     GEN:  Well nourished, well developed in no acute distress HEENT: Normal NECK: No JVD; No carotid bruits CARDIAC: RRR, 2/6 systolic mumur RESPIRATORY:  Clear to auscultation without rales, wheezing or rhonchi  ABDOMEN: Soft, non-tender, non-distended MUSCULOSKELETAL:  No edema; No deformity  SKIN: Warm and dry NEUROLOGIC:  Alert and oriented x 3 PSYCHIATRIC:  Normal affect   ASSESSMENT:    1. Syncope and collapse   2. Lipid screening   3. OSA (obstructive sleep apnea)   4. Essential hypertension     PLAN:    Syncope: Has history of falls due to Parkinson's but recent falls concerning for syncopal episodes.  One of the episodes sounds like orthostasis, but other episodes occurred without position change.  Orthostatics in clinic were unremarkable.  No prodromal  symptoms.  Zio patch x14 days on 11/12/2020 showed 1 episode of NSVT  lasting 7 beats; no syncopal episodes while wearing Zio patch.  Echocardiogram on 11/20/2020 showed prominent apical trabeculations, EF 55 to 60%, moderate LVH, grade 1 diastolic dysfunction, normal RV function, no significant valvular disease.  Cardiac MRI on 01/02/2021 showed hyper trabeculation of LV apex but not consistent with noncompaction cardiomyopathy given normal systolic function (EF 37%), no LGE. -Reports no recent syncopal episodes.  Can monitor, if having more episodes would consider loop recorder  OSA: Not on CPAP, reports she no longer has machine.  Epworth 15.  Sleep study 12/10/2020 consistent with OSA, starting on CPAP  Hypertension: On verapamil 120 mg daily.  BP elevated in clinic today.  Hesitant to increase antihypertensive given symptoms suggestive of orthostasis.  Asked to check BP twice daily for next week and call with results  Lipid screening: Will check lipid panel   RTC in 3 months   Medication Adjustments/Labs and Tests Ordered: Current medicines are reviewed at length with the patient today.  Concerns regarding medicines are outlined above.  Orders Placed This Encounter  Procedures   Lipid panel     No orders of the defined types were placed in this encounter.   Patient Instructions  Medication Instructions:  Your physician recommends that you continue on your current medications as directed. Please refer to the Current Medication list given to you today.  *If you need a refill on your cardiac medications before your next appointment, please call your pharmacy*  Lab Work: Lipid today  If you have labs (blood work) drawn today and your tests are completely normal, you will receive your results only by: [place] (if you have MyChart) OR A paper copy in the mail If you have any lab test that is abnormal or we need to change your treatment, we will call you to review the results.  Follow-Up: At [facility], you and your health needs are our  priority.  As part of our continuing mission to provide you with exceptional heart care, we have created designated Provider Care Teams.  These Care Teams include your primary Cardiologist (physician) and Advanced Practice Providers (APPs -  Physician Assistants and Nurse Practitioners) who all work together to provide you with the care you need, when you need it.  We recommend signing up for the patient portal called "MyChart".  Sign up information is provided on this After Visit Summary.  MyChart is used to connect with patients for Virtual Visits (Telemedicine).  Patients are able to view lab/test results, encounter notes, upcoming appointments, etc.  Non-urgent messages can be sent to your provider as well.   To learn more about what you can do with MyChart, go to [email or web address].    Your next appointment:   3 months with PA or NP 6 months with Dr. Gardiner Rhyme  Other Instructions Please check your blood pressure at home twice daily, write it down.  Call the office or send message via Mychart with the readings in 1 week for Dr. Gardiner Rhyme to review.     Signed, Donato Heinz, MD  01/07/2021 2:21 PM    [facility]

## 2021-01-07 ENCOUNTER — Ambulatory Visit (INDEPENDENT_AMBULATORY_CARE_PROVIDER_SITE_OTHER): Payer: Medicare Other | Admitting: Cardiology

## 2021-01-07 ENCOUNTER — Other Ambulatory Visit: Payer: Self-pay

## 2021-01-07 ENCOUNTER — Encounter: Payer: Self-pay | Admitting: Cardiology

## 2021-01-07 VITALS — BP 158/88 | HR 95 | Ht 61.5 in | Wt 156.6 lb

## 2021-01-07 DIAGNOSIS — G4733 Obstructive sleep apnea (adult) (pediatric): Secondary | ICD-10-CM | POA: Diagnosis not present

## 2021-01-07 DIAGNOSIS — I10 Essential (primary) hypertension: Secondary | ICD-10-CM

## 2021-01-07 DIAGNOSIS — Z13220 Encounter for screening for lipoid disorders: Secondary | ICD-10-CM | POA: Diagnosis not present

## 2021-01-07 DIAGNOSIS — I428 Other cardiomyopathies: Secondary | ICD-10-CM | POA: Diagnosis not present

## 2021-01-07 DIAGNOSIS — R55 Syncope and collapse: Secondary | ICD-10-CM | POA: Diagnosis not present

## 2021-01-07 NOTE — Patient Instructions (Signed)
Medication Instructions:  Your physician recommends that you continue on your current medications as directed. Please refer to the Current Medication list given to you today.  *If you need a refill on your cardiac medications before your next appointment, please call your pharmacy*  Lab Work: Lipid today  If you have labs (blood work) drawn today and your tests are completely normal, you will receive your results only by: [place] (if you have MyChart) OR A paper copy in the mail If you have any lab test that is abnormal or we need to change your treatment, we will call you to review the results.  Follow-Up: At [facility], you and your health needs are our priority.  As part of our continuing mission to provide you with exceptional heart care, we have created designated Provider Care Teams.  These Care Teams include your primary Cardiologist (physician) and Advanced Practice Providers (APPs -  Physician Assistants and Nurse Practitioners) who all work together to provide you with the care you need, when you need it.  We recommend signing up for the patient portal called "MyChart".  Sign up information is provided on this After Visit Summary.  MyChart is used to connect with patients for Virtual Visits (Telemedicine).  Patients are able to view lab/test results, encounter notes, upcoming appointments, etc.  Non-urgent messages can be sent to your provider as well.   To learn more about what you can do with MyChart, go to [email or web address].    Your next appointment:   3 months with PA or NP 6 months with Dr. Gardiner Rhyme  Other Instructions Please check your blood pressure at home twice daily, write it down.  Call the office or send message via Mychart with the readings in 1 week for Dr. Gardiner Rhyme to review.

## 2021-01-08 ENCOUNTER — Telehealth: Payer: Self-pay | Admitting: *Deleted

## 2021-01-08 LAB — LIPID PANEL
Chol/HDL Ratio: 2.5 ratio (ref 0.0–4.4)
Cholesterol, Total: 200 mg/dL — ABNORMAL HIGH (ref 100–199)
HDL: 79 mg/dL (ref >39)
LDL Chol Calc (NIH): 111 mg/dL — ABNORMAL HIGH (ref 0–99)
Triglycerides: 53 mg/dL (ref 0–149)
VLDL Cholesterol Cal: 10 mg/dL (ref 5–40)

## 2021-01-08 NOTE — Telephone Encounter (Signed)
Patient notified of sleep study results and recommendations. 

## 2021-01-08 NOTE — Telephone Encounter (Signed)
-----   Message from Troy Sine, MD sent at 01/05/2021 12:24 PM EST ----- Mariann Laster, please notify pt and try initiating Auto-PAP

## 2021-01-10 ENCOUNTER — Telehealth: Payer: Self-pay | Admitting: Cardiology

## 2021-01-10 ENCOUNTER — Other Ambulatory Visit: Payer: Self-pay | Admitting: *Deleted

## 2021-01-10 MED ORDER — ROSUVASTATIN CALCIUM 10 MG PO TABS: 10.0000 mg | ORAL_TABLET | Freq: Every day | ORAL | 3 refills | Status: DC

## 2021-01-10 NOTE — Telephone Encounter (Signed)
Donato Heinz, MD  01/09/2021  9:59 AM EST     Cholesterol elevated, 10-year ASCVD risk score 23% (high risk).  Recommend starting rosuvastatin 10 mg daily    Discussed with patient-patient aware and verbalized understanding.

## 2021-01-10 NOTE — Telephone Encounter (Signed)
Pt c/o medication issue:  1. Name of Medication: rosuvastatin (CRESTOR) 10 MG tablet  2. How are you currently taking this medication (dosage and times per day)? Take 1 tablet (10 mg total) by mouth daily.  3. Are you having a reaction (difficulty breathing--STAT)? no  4. What is your medication issue? Patient calling in to see why she was put on that medication. She states didn't know bout it till the pharmacy called her. Please advise

## 2021-01-21 ENCOUNTER — Telehealth: Payer: Self-pay | Admitting: Cardiology

## 2021-01-21 MED ORDER — PRAMIPEXOLE DIHYDROCHLORIDE 0.5 MG PO TABS
0.5000 mg | ORAL_TABLET | Freq: Three times a day (TID) | ORAL | 3 refills | Status: DC
Start: 2021-01-21 — End: 2021-03-25

## 2021-01-21 NOTE — Telephone Encounter (Signed)
Pt called states the pramipexole (MIRAPEX) 0.125 MG tablet is making shake even worse. In both hands and legs. Pt requesting a call back.

## 2021-01-21 NOTE — Telephone Encounter (Signed)
Pt is calling regarding her recent results and she wants to know what her condition is now? Please advise pt further [phone]

## 2021-01-21 NOTE — Telephone Encounter (Signed)
At this juncture, I recommend that she increase her pramipexole to the 0.5 mg strength, take 1 pill 3 times daily.  I adjusted her prescription to the new medication strength of 0.5 mg and sent a 90-day prescription to her mail order pharmacy on file.

## 2021-01-21 NOTE — Addendum Note (Signed)
Addended by: Star Age on: 01/21/2021 05:56 PM   Modules accepted: Orders

## 2021-01-21 NOTE — Telephone Encounter (Signed)
Spoke with patient about results - she is asking for monitor results. Advised they were discussed during 11/15 visit, but also reviewed those + cMRI results with her. Advised no changes made based on either test. Continue current therapy.

## 2021-01-21 NOTE — Telephone Encounter (Addendum)
I spoke with the patient.  She has been taking the Mirapex 3 tablets 3 times a day as previously instructed.  Unfortunately she feels like she is shaking more.  She states there were periods where she was shaking less but now even while she is laying in the bed she is shaking.  She does take a sleep aid at night which allows her to relax.  I let her know I would express her concerns to Dr. Rexene Alberts and give her a call back. She also reports she has been in pain and stooped over and definitely has "gone into parkinson's".  She also stated that she was called about a prescription refill for the Mirapex and she used did not pay anything but now they are telling her $43 is her cost.  She is going to contact her mail order pharmacy to look into that.

## 2021-01-22 NOTE — Telephone Encounter (Signed)
The patient returned my call.  We discussed the new strength of the pramipexole which is now 0.5 mg and she will take 1 pill 3 times daily.  She is aware the prescription went to her mail order pharmacy, [facility].  She will call them to discuss pricing and delivery.  I let her know [facility] would call [facility] 3.125 mg strength has she will be switching from one strength to the other.  She will not take both of these at the same time.  The patient's questions were answered and she verbalized understanding and appreciation.  [facility] [address] and I canceled all prescriptions on file for pramipexole.

## 2021-01-22 NOTE — Telephone Encounter (Signed)
I called the pt and LVM asking for call back to discuss changes. Left office number in message.

## 2021-01-23 DIAGNOSIS — M47816 Spondylosis without myelopathy or radiculopathy, lumbar region: Secondary | ICD-10-CM | POA: Diagnosis not present

## 2021-01-23 DIAGNOSIS — G894 Chronic pain syndrome: Secondary | ICD-10-CM | POA: Diagnosis not present

## 2021-01-23 DIAGNOSIS — M1712 Unilateral primary osteoarthritis, left knee: Secondary | ICD-10-CM | POA: Diagnosis not present

## 2021-01-23 DIAGNOSIS — M48061 Spinal stenosis, lumbar region without neurogenic claudication: Secondary | ICD-10-CM | POA: Diagnosis not present

## 2021-02-04 ENCOUNTER — Telehealth: Payer: Self-pay | Admitting: Internal Medicine

## 2021-02-04 NOTE — Telephone Encounter (Signed)
Patient calling in  Patient says she is having severe pain in both knees & is experiencing swelling in both ankles  Patient says she was given shot in one knee by Dr. Maryan Puls last month but it made the pain worse instead of better  Patient says she has sent several My Chart messages to Dr. Maryan Puls but has not gotten a response  Wants to know if Dr. Alain Marion can assist in giving her something to help w/ the pain  Please call patient [phone]

## 2021-02-06 NOTE — Telephone Encounter (Signed)
Patient calling to check status of request  Offered patient an ov w/ another provider, next availability is 12-23  Patient states she is unable to put pressure on her legs  Please call patient *see below*

## 2021-02-07 MED ORDER — METHYLPREDNISOLONE 4 MG PO TBPK: ORAL_TABLET | ORAL | 0 refills | Status: DC

## 2021-02-07 NOTE — Telephone Encounter (Signed)
Patient checking status of request  Informed patient rx was sent to local pharmacy  Advised patient to schedule an ov, patient stated she will call back to schedule ov

## 2021-02-07 NOTE — Telephone Encounter (Signed)
I will send prescription for Medrol Dosepak.  Office visit with me or someone else if not better.  Thank you

## 2021-02-10 NOTE — Telephone Encounter (Signed)
Patient is in a lot of pain, states she started her medications when prescribed and no change pain is worse. Legs and feet are swollen and it hurts to bad to move.   Looked for appts in local office and could not find any. What are your suggestions?

## 2021-02-11 NOTE — Telephone Encounter (Signed)
Lorre Nick, Do we need to work Paediatric nurse in? If her chronic pain is worse, she needs to contact pain clinic.  If it is a new pain, we can see her here. Thanks,

## 2021-02-13 NOTE — Telephone Encounter (Signed)
Pt was advise on Monday that MD couldn't rx anything for pain due to [facility].MD had sent on Medrol pack to help with  swelling. Will need ov if sxs continued,,,/lmb

## 2021-02-20 DIAGNOSIS — M1712 Unilateral primary osteoarthritis, left knee: Secondary | ICD-10-CM | POA: Diagnosis not present

## 2021-02-20 DIAGNOSIS — M47816 Spondylosis without myelopathy or radiculopathy, lumbar region: Secondary | ICD-10-CM | POA: Diagnosis not present

## 2021-02-20 DIAGNOSIS — G894 Chronic pain syndrome: Secondary | ICD-10-CM | POA: Diagnosis not present

## 2021-02-20 DIAGNOSIS — M48061 Spinal stenosis, lumbar region without neurogenic claudication: Secondary | ICD-10-CM | POA: Diagnosis not present

## 2021-02-21 ENCOUNTER — Emergency Department (HOSPITAL_BASED_OUTPATIENT_CLINIC_OR_DEPARTMENT_OTHER)
Admission: EM | Admit: 2021-02-21 | Discharge: 2021-02-22 | Disposition: A | Discharge status: Home / Self Care | Payer: Medicare Other | Attending: Emergency Medicine | Admitting: Emergency Medicine

## 2021-02-21 ENCOUNTER — Encounter (HOSPITAL_BASED_OUTPATIENT_CLINIC_OR_DEPARTMENT_OTHER): Payer: Self-pay | Admitting: Emergency Medicine

## 2021-02-21 ENCOUNTER — Other Ambulatory Visit: Payer: Self-pay

## 2021-02-21 DIAGNOSIS — M7989 Other specified soft tissue disorders: Secondary | ICD-10-CM | POA: Diagnosis not present

## 2021-02-21 DIAGNOSIS — Z79899 Other long term (current) drug therapy: Secondary | ICD-10-CM | POA: Diagnosis not present

## 2021-02-21 DIAGNOSIS — G20 Parkinson's disease: Secondary | ICD-10-CM | POA: Diagnosis not present

## 2021-02-21 DIAGNOSIS — J45909 Unspecified asthma, uncomplicated: Secondary | ICD-10-CM | POA: Insufficient documentation

## 2021-02-21 DIAGNOSIS — I10 Essential (primary) hypertension: Secondary | ICD-10-CM | POA: Diagnosis not present

## 2021-02-21 LAB — CBC WITH DIFFERENTIAL/PLATELET
Abs Immature Granulocytes: 0.03 10*3/uL (ref 0.00–0.07)
Basophils Absolute: 0 10*3/uL (ref 0.0–0.1)
Basophils Relative: 1 %
Eosinophils Absolute: 0.3 10*3/uL (ref 0.0–0.5)
Eosinophils Relative: 4 %
HCT: 38.9 % (ref 36.0–46.0)
Hemoglobin: 12.4 g/dL (ref 12.0–15.0)
Immature Granulocytes: 0 %
Lymphocytes Relative: 29 %
Lymphs Abs: 2.4 10*3/uL (ref 0.7–4.0)
MCH: 29 pg (ref 26.0–34.0)
MCHC: 31.9 g/dL (ref 30.0–36.0)
MCV: 91.1 fL (ref 80.0–100.0)
Monocytes Absolute: 0.9 10*3/uL (ref 0.1–1.0)
Monocytes Relative: 11 %
Neutro Abs: 4.5 10*3/uL (ref 1.7–7.7)
Neutrophils Relative %: 55 %
Platelets: 289 10*3/uL (ref 150–400)
RBC: 4.27 MIL/uL (ref 3.87–5.11)
RDW: 16.2 % — ABNORMAL HIGH (ref 11.5–15.5)
WBC: 8.1 10*3/uL (ref 4.0–10.5)
nRBC: 0 % (ref 0.0–0.2)

## 2021-02-21 LAB — BASIC METABOLIC PANEL
Anion gap: 4 — ABNORMAL LOW (ref 5–15)
BUN: 16 mg/dL (ref 8–23)
CO2: 34 mmol/L — ABNORMAL HIGH (ref 22–32)
Calcium: 9.2 mg/dL (ref 8.9–10.3)
Chloride: 103 mmol/L (ref 98–111)
Creatinine, Ser: 0.94 mg/dL (ref 0.44–1.00)
GFR, Estimated: >60 mL/min (ref >60)
Glucose, Bld: 116 mg/dL — ABNORMAL HIGH (ref 70–99)
Potassium: 3.7 mmol/L (ref 3.5–5.1)
Sodium: 141 mmol/L (ref 135–145)

## 2021-02-21 NOTE — ED Notes (Signed)
External catheter placed for patient's comfort

## 2021-02-21 NOTE — ED Provider Notes (Signed)
[facility] EMERGENCY DEPT Provider Note   CSN: [id number] Arrival date & time: 02/21/21  1412     History Chief Complaint  Patient presents with   Leg Swelling    Mallory Klein is a 75 y.o. female.  Patient followed by Dr. Alain Marion.  Patient with a complaint of some swelling to the top of her feet ankles and lower part of her leg.  It is bilateral.  Denies any shortness of breath or any chest pain.  Patient states that they gave a trial of Medrol Dosepak the steroid and swelling did improve.  But now it is back.  Dr. Rosanna Randy to see her in the office.  But she has not been able to get an appointment back in.  Patient does have a history of hypertension.  She is on a calcium channel blocker.  Not on a diuretic.  Patient is followed by cardiology but that is more for syncope.  Past medical history significant for diverticulosis shortness of breath with dyspnea but related to allergies.  Obstructive sleep apnea.  Essential hypertension.  Reflux disease.  And a history of Parkinson's disease.  Mild neurocognitive disorder.  More significantly patient does not really complain of pain to the feet.  Has some little bit of discomfort but that was existing prior to the swelling.  Really has more trouble with joint pain in her knees.      Past Medical History:  Diagnosis Date   Abnormal CBC 02/05/2015   [name] 12/16 new: normocytic anemia and thrombocytosis   Adjustment disorder with mixed anxiety and depressed mood 02/15/2007   Chronic  Chronic pain Grief, stress Effexor XR  Dad died in Mar 06, 2023   Asthma    Diverticulosis of colon (without mention of hemorrhage)    Esophageal stricture    Essential hypertension 01/26/2007   Chronic Verapamil    Falls    "I blackout"   GERD (gastroesophageal reflux disease) 02/15/2007   Chronic     Heart murmur    Hyperglycemia 03/02/2011   Mild     Insomnia disorder 12/08/2018   10/20 Carbid/Lev dose was increased - c/o hard time  falling asleep (6 am) getting up at 12 am, poor sleep. Try Temazepam 15-30 mg at 11-1:30 pm    Internal hemorrhoids without mention of complication    Iron deficiency anemia 04/23/2017   Mild neurocognitive disorder, likely due to Parkinson's disease 02/09/2019   OSA (obstructive sleep apnea) 02/14/2010   In lab study (May 2018): AHI 25. Only had 45 minutes of sleep secondary to frequent awakenings secondary to sleep apnea. autocpap 5-15 cm water.    Osteoarthritis    Parkinson disease ([place]) 2014-03-05   2015 2017  Primidone - d/c 2019 Parkinson's: [facility] IR. Dr Tat   Postherpetic trigeminal neuralgia 01/26/2007   Qualifier: Diagnosis of  By: Marca Ancona RMA, [name]     Shortness of breath dyspnea    with allergies   Thrombocytosis 05/24/2017   Urinary incontinence 01/26/2007   Chronic  10/17 Detrol LA    Vitamin D deficiency 01/26/2019    Patient Active Problem List   Diagnosis Date Noted   Complete tear of right rotator cuff 08/22/2020   Syncope and collapse 08/06/2020   Shoulder contusion 07/31/2020   Fall 07/31/2020   Concussion 07/31/2020   Anosmia 05/02/2020   Grief reaction 03/27/2020   Memory loss 12/13/2019   Food poisoning 09/11/2019   B12 deficiency 06/14/2019   Mild neurocognitive disorder, likely due to Parkinson's disease  02/09/2019   Vitamin D deficiency 01/26/2019   Sweating profusely 12/08/2018   Insomnia disorder 12/08/2018   Cerumen impaction 10/19/2018   Lumbar facet arthropathy 02/10/2018   Thrombocytosis 05/24/2017   Iron deficiency anemia 04/23/2017   Cough 05/12/2016   Pseudophakia of both eyes 10/09/2015   Abnormal CBC 02/05/2015   Edema 10/15/2014   S/P lumbar laminectomy 02/20/2014   Pre-ulcerative calluses 02/09/2014   Parkinson disease ([place]) 02/09/2014   Cold sore 10/17/2013   Low back pain 07/25/2013   Nuclear sclerosis 02/08/2013   Heel ulcer ([place]) 03/08/2012   Rash 12/04/2011   Neck pain on left side 08/06/2011   Presence of unspecified  artificial knee joint 04/22/2011   Hyperglycemia 03/02/2011   Acquired spondylolisthesis 12/25/2010   Degeneration of lumbosacral intervertebral disc 12/25/2010   Arthritis of right knee 12/03/2010   Knee pain, right 09/08/2010   OSA (obstructive sleep apnea) 02/14/2010   BRONCHITIS, ACUTE 04/05/2009   Vertigo 12/02/2007   Other abnormal glucose 12/02/2007   Otitis media 10/26/2007   Other malaise and fatigue 05/11/2007   Adjustment disorder with mixed anxiety and depressed mood 02/15/2007   GERD (gastroesophageal reflux disease) 02/15/2007   Osteoarthritis 02/15/2007   Postherpetic trigeminal neuralgia 01/26/2007   Herpes zoster 01/26/2007   Morbid obesity 01/26/2007   Migraine headaches 01/26/2007   Essential hypertension 01/26/2007   Asthma 01/26/2007   Urinary incontinence 01/26/2007    Past Surgical History:  Procedure Laterality Date   ABDOMINAL HYSTERECTOMY     BREAST CYST EXCISION Left 1980's   LUMBAR LAMINECTOMY/DECOMPRESSION MICRODISCECTOMY Bilateral 02/20/2014   Procedure: LUMBAR TWO THREE, LUMBAR THREE FOUR LUMBAR LAMINECTOMY/DECOMPRESSION MICRODISCECTOMY 2 LEVELS;  Surgeon: Eustace Moore, MD;  Location: [facility];  Service: Neurosurgery;  Laterality: Bilateral;   SPLENECTOMY     TIBIA FRACTURE SURGERY Right    TIBIA FRACTURE SURGERY Left    TOTAL KNEE ARTHROPLASTY Right 2013     OB History   No obstetric history on file.     Family History  Problem Relation Age of Onset   Hypertension Mother    Diabetes type II Mother    Asthma Mother    Diabetes Mother    Dementia Mother    Prostate cancer Father    Mental illness Father        dementia   Suicidality Son    Stroke Brother        died in prison   Colon cancer Neg Hx    Rectal cancer Neg Hx     Social History   Tobacco Use   Smoking status: Never   Smokeless tobacco: Never  Vaping Use   Vaping Use: Never used  Substance Use Topics   Alcohol use: Yes    Alcohol/week: 2.0 standard drinks     Types: 2 Glasses of wine per week    Comment: wine on occ    Drug use: No    Home Medications Prior to Admission medications   Medication Sig Start Date End Date Taking? Authorizing Provider  hydrochlorothiazide (MICROZIDE) 12.5 MG capsule Take 1 capsule (12.5 mg total) by mouth daily. 02/22/21  Yes Fredia Sorrow, MD  buprenorphine (BUTRANS) 7.5 MCG/HR 1 patch once a week. 08/01/20   [provider]  celecoxib (CELEBREX) 200 MG capsule Take 1 capsule (200 mg total) by mouth 2 (two) times daily as needed for moderate pain. 03/27/20   Plotnikov, Evie Lacks, MD  cetirizine (ZYRTEC) 10 MG tablet TAKE 1 TABLET BY MOUTH EVERY DAY 10/17/20  Plotnikov, Evie Lacks, MD  Cholecalciferol 25 MCG (1000 UT) tablet Take by mouth.    [provider]  [employer] SENNA PLUS 8.6-50 MG tablet Take 1 tablet by mouth 2 (two) times daily. 09/26/20   [provider]  cyanocobalamin (,VITAMIN B-12,) 1000 MCG/ML injection INJECT 1 ML INTO THE SKIN EVERY 14 DAYS 08/01/20   Plotnikov, Evie Lacks, MD  diclofenac Sodium (VOLTAREN) 1 % GEL Apply 4 g topically daily as needed (pain). 04/25/13   [provider]  docusate sodium (COLACE) 100 MG capsule Take 100 mg by mouth at bedtime. 06/26/19   [provider]  hydrOXYzine (VISTARIL) 25 MG capsule Take 25 mg by mouth once. 09/05/20   [provider]  methylPREDNISolone (MEDROL DOSEPAK) 4 MG TBPK tablet As directed 02/07/21   Plotnikov, Evie Lacks, MD  montelukast (SINGULAIR) 10 MG tablet TAKE 1 TABLET BY MOUTH  DAILY 12/03/20   Plotnikov, Evie Lacks, MD  OVER THE COUNTER MEDICATION Take 1 tablet by mouth at bedtime. Lions mane extract    [provider]  pantoprazole (PROTONIX) 40 MG tablet TAKE 1 TABLET BY MOUTH  DAILY 12/03/20   Plotnikov, Evie Lacks, MD  polyethylene glycol (MIRALAX / GLYCOLAX) 17 g packet Take 17 g by mouth daily as needed for mild constipation.    [provider]  pramipexole (MIRAPEX) 0.5 MG tablet  Take 1 tablet (0.5 mg total) by mouth 3 (three) times daily. Follow titration instructions provided separately. 01/21/21   Star Age, MD  rosuvastatin (CRESTOR) 10 MG tablet Take 1 tablet (10 mg total) by mouth daily. 01/10/21 01/05/22  Donato Heinz, MD  verapamil (VERELAN PM) 120 MG 24 hr capsule Take 1 capsule (120 mg total) by mouth at bedtime. 03/27/20   Plotnikov, Evie Lacks, MD  zolpidem (AMBIEN) 10 MG tablet TAKE 1/2 TO 1 TABLET BY  MOUTH AT BEDTIME AS NEEDED  FOR SLEEP 10/03/20   Plotnikov, Evie Lacks, MD  donepezil (ARICEPT) 5 MG tablet Take 1 tablet (5 mg total) by mouth at bedtime. Patient not taking: No sig reported 11/27/19 07/23/20  Plotnikov, Evie Lacks, MD  venlafaxine XR (EFFEXOR-XR) 150 MG 24 hr capsule TAKE 1 CAPSULE BY MOUTH  EVERY DAY WITH BREAKFAST Patient not taking: No sig reported 05/29/19 07/23/20  Plotnikov, Evie Lacks, MD    Allergies    Ace inhibitors, Benicar [olmesartan], Aspirin, and Citalopram hydrobromide  Review of Systems   Review of Systems  Constitutional:  Negative for chills and fever.  HENT:  Negative for ear pain and sore throat.   Eyes:  Negative for pain and visual disturbance.  Respiratory:  Negative for cough and shortness of breath.   Cardiovascular:  Positive for leg swelling. Negative for chest pain and palpitations.  Gastrointestinal:  Negative for abdominal pain and vomiting.  Genitourinary:  Negative for dysuria and hematuria.  Musculoskeletal:  Negative for arthralgias and back pain.  Skin:  Negative for color change and rash.  Neurological:  Positive for tremors. Negative for seizures and syncope.  All other systems reviewed and are negative.  Physical Exam Updated Vital Signs BP 116/61 (BP Location: Left Arm)    Pulse 86    Temp 99.6 F (37.6 C)    Resp 18    Ht 1.575 m (5\' 2" )    Wt 79.4 kg    SpO2 97%    BMI 32.01 kg/m   Physical Exam Vitals and nursing note reviewed.  Constitutional:      General: She is not  in acute  distress.    Appearance: Normal appearance. She is well-developed.  HENT:     Head: Normocephalic and atraumatic.  Eyes:     Extraocular Movements: Extraocular movements intact.     Conjunctiva/sclera: Conjunctivae normal.     Pupils: Pupils are equal, round, and reactive to light.  Cardiovascular:     Rate and Rhythm: Normal rate and regular rhythm.     Heart sounds: No murmur heard. Pulmonary:     Effort: Pulmonary effort is normal. No respiratory distress.     Breath sounds: Normal breath sounds. No wheezing, rhonchi or rales.  Abdominal:     Palpations: Abdomen is soft.     Tenderness: There is no abdominal tenderness.  Musculoskeletal:        General: Swelling present.     Cervical back: Normal range of motion and neck supple.     Right lower leg: Edema present.     Left lower leg: Edema present.     Comments: Patient with some slight edema to both ankles.  Little bit of swelling to the top of the feet on both feet.  Good cap refill.  Good movement of the feet.  No calf tenderness  Skin:    General: Skin is warm and dry.     Capillary Refill: Capillary refill takes less than 2 seconds.  Neurological:     Mental Status: She is alert. Mental status is at baseline.     Comments: Patient with tremors secondary to her Parkinson's.  But nothing new or acute.  Psychiatric:        Mood and Affect: Mood normal.    ED Results / Procedures / Treatments   Labs (all labs ordered are listed, but only abnormal results are displayed) Labs Reviewed  CBC WITH DIFFERENTIAL/PLATELET - Abnormal; Notable for the following components:      Result Value   RDW 16.2 (*)    All other components within normal limits  BASIC METABOLIC PANEL - Abnormal; Notable for the following components:   CO2 34 (*)    Glucose, Bld 116 (*)    Anion gap 4 (*)    All other components within normal limits    EKG None  Radiology No results found.  Procedures Procedures   Medications Ordered in  ED Medications - No data to display  ED Course  I have reviewed the triage vital signs and the nursing notes.  Pertinent labs & imaging results that were available during my care of the patient were reviewed by me and considered in my medical decision making (see chart for details).    MDM Rules/Calculators/A&P                          Patient definitely has some bilateral edema to the foot ankle area.  A little bit to the distal leg.  But no significant edema.  Lungs are very clear.  No evidence of any rales or rhonchi.  Suspect patient may have a little bit of lower extremity edema will give a treatment with mild low-dose diuretic.  And follow back up with her primary care doctor.  Do not see an indication for steroids at this time but perhaps primary care doctor will think differently.  No leukocytosis hemoglobin is 89.3 basic metabolic panel renal function is normal.  Gust chest x-ray with patient but she has had such a long wait she did not want a wait for for that.  Most likely not needed.  Patient's blood pressure is good here most recently 138/79 respiration 16 oxygen saturation 100% on room air.  Do not think that patient is having any significant pulmonary edema.  Based on her blood pressure should be at handle a mild diuretic.  Her potassium is 3.7.  Final Clinical Impression(s) / ED Diagnoses Final diagnoses:  Leg swelling    Rx / DC Orders ED Discharge Orders          Ordered    hydrochlorothiazide (MICROZIDE) 12.5 MG capsule  Daily        02/22/21 0007             Fredia Sorrow, MD 02/22/21 [id number]

## 2021-02-21 NOTE — ED Triage Notes (Signed)
Pt arrives pov with c/o bilateral lower extremity swelling. Pt denies fever, endorses recent tx with prednisone, swelling returned.

## 2021-02-22 ENCOUNTER — Other Ambulatory Visit (HOSPITAL_BASED_OUTPATIENT_CLINIC_OR_DEPARTMENT_OTHER): Payer: Medicare Other

## 2021-02-22 MED ORDER — HYDROCHLOROTHIAZIDE 12.5 MG PO CAPS: 12.5000 mg | ORAL_CAPSULE | Freq: Every day | ORAL | 1 refills | Status: DC

## 2021-02-22 NOTE — Discharge Instructions (Addendum)
Trial of the hydrochlorothiazide diuretic.  Take 1 pill daily.  Make an appointment to follow-up with your doctor.  We will see if this helps the ankle and foot swelling.  Very important that you follow back up with your primary care doctor when she start this medication.  Return for any new or worse symptoms.

## 2021-02-25 ENCOUNTER — Telehealth: Payer: Self-pay | Admitting: Internal Medicine

## 2021-02-25 NOTE — Telephone Encounter (Signed)
Connected to [facility] 1.2.2023.  Caller is taking a prescription (hydrochlorothiazide 12.5mg ) and her feet are swollen. Mallory Klein status, urg only calls

## 2021-02-27 MED ORDER — HYDROCHLOROTHIAZIDE 12.5 MG PO CAPS: 12.5000 mg | ORAL_CAPSULE | Freq: Every day | ORAL | 11 refills | Status: DC

## 2021-02-27 NOTE — Telephone Encounter (Signed)
Pt has appt 03/03/20.Marland KitchenJohny Klein

## 2021-02-27 NOTE — Telephone Encounter (Signed)
Done. Sch RV Thx

## 2021-03-03 ENCOUNTER — Other Ambulatory Visit: Payer: Self-pay

## 2021-03-03 ENCOUNTER — Encounter: Payer: Self-pay | Admitting: Internal Medicine

## 2021-03-03 ENCOUNTER — Ambulatory Visit (INDEPENDENT_AMBULATORY_CARE_PROVIDER_SITE_OTHER): Payer: Medicare Other | Admitting: Internal Medicine

## 2021-03-03 VITALS — BP 130/76 | HR 89 | Temp 98.5°F

## 2021-03-03 DIAGNOSIS — I10 Essential (primary) hypertension: Secondary | ICD-10-CM

## 2021-03-03 DIAGNOSIS — G20 Parkinson's disease: Secondary | ICD-10-CM | POA: Diagnosis not present

## 2021-03-03 DIAGNOSIS — M5440 Lumbago with sciatica, unspecified side: Secondary | ICD-10-CM

## 2021-03-03 DIAGNOSIS — J069 Acute upper respiratory infection, unspecified: Secondary | ICD-10-CM

## 2021-03-03 DIAGNOSIS — E538 Deficiency of other specified B group vitamins: Secondary | ICD-10-CM

## 2021-03-03 DIAGNOSIS — G8929 Other chronic pain: Secondary | ICD-10-CM | POA: Diagnosis not present

## 2021-03-03 DIAGNOSIS — R059 Cough, unspecified: Secondary | ICD-10-CM | POA: Diagnosis not present

## 2021-03-03 DIAGNOSIS — R609 Edema, unspecified: Secondary | ICD-10-CM | POA: Diagnosis not present

## 2021-03-03 DIAGNOSIS — G20A1 Parkinson's disease without dyskinesia, without mention of fluctuations: Secondary | ICD-10-CM

## 2021-03-03 LAB — POC COVID19 BINAXNOW: SARS Coronavirus 2 Ag: NEGATIVE

## 2021-03-03 MED ORDER — FUROSEMIDE 20 MG PO TABS
20.0000 mg | ORAL_TABLET | Freq: Every day | ORAL | 3 refills | Status: DC | PRN
Start: 2021-03-03 — End: 2021-03-14

## 2021-03-03 MED ORDER — PROMETHAZINE-DM 6.25-15 MG/5ML PO SYRP: 5.0000 mL | ORAL_SOLUTION | Freq: Four times a day (QID) | ORAL | 0 refills | Status: DC | PRN

## 2021-03-03 MED ORDER — VERAPAMIL HCL ER 100 MG PO CP24: 100.0000 mg | ORAL_CAPSULE | Freq: Every day | ORAL | 3 refills | Status: DC

## 2021-03-03 NOTE — Assessment & Plan Note (Addendum)
BP Readings from Last 3 Encounters:  03/03/21 130/76  02/22/21 116/61  01/07/21 (!) 158/88   Hold Verapamil due to swelling. Reduce dose Hold HCTZ. Take Furosemide

## 2021-03-03 NOTE — Assessment & Plan Note (Signed)
Hold Verapamil due to swelling. Reduce dose Hold HCTZ. Take Furosemide

## 2021-03-03 NOTE — Assessment & Plan Note (Signed)
Pt needs a w/c

## 2021-03-03 NOTE — Patient Instructions (Signed)
Hold Verapamil due to swelling.  Hold HCTZ. Take Furosemide as needed

## 2021-03-03 NOTE — Assessment & Plan Note (Signed)
Per [facility]

## 2021-03-03 NOTE — Assessment & Plan Note (Signed)
Use Phenergan Rx cough syr prn

## 2021-03-03 NOTE — Assessment & Plan Note (Signed)
On B12 inj 

## 2021-03-03 NOTE — Progress Notes (Signed)
Subjective:  Patient ID: Mallory Klein, female    DOB: [birth year]  Age: 76 y.o. MRN: [id number]  CC: Follow-up (ER /follow-up- Leg welling. Pt states her feet is still swollen) and Cough (Also have develop a cough. Requesting cough medicine)   HPI Mallory Klein presents for LE swelling - not much better C/o URI, cough and cold x 5 d C/o chronic pain - worse  Outpatient Medications Prior to Visit  Medication Sig Dispense Refill   buprenorphine (BUTRANS) 7.5 MCG/HR 1 patch once a week.     celecoxib (CELEBREX) 200 MG capsule Take 1 capsule (200 mg total) by mouth 2 (two) times daily as needed for moderate pain. 60 capsule 3   cetirizine (ZYRTEC) 10 MG tablet TAKE 1 TABLET BY MOUTH EVERY DAY 90 tablet 3   Cholecalciferol 25 MCG (1000 UT) tablet Take by mouth.     [employer] SENNA PLUS 8.6-50 MG tablet Take 1 tablet by mouth 2 (two) times daily.     cyanocobalamin (,VITAMIN B-12,) 1000 MCG/ML injection INJECT 1 ML INTO THE SKIN EVERY 14 DAYS 10 mL 3   diclofenac Sodium (VOLTAREN) 1 % GEL Apply 4 g topically daily as needed (pain).     docusate sodium (COLACE) 100 MG capsule Take 100 mg by mouth at bedtime.     hydrochlorothiazide (MICROZIDE) 12.5 MG capsule Take 1 capsule (12.5 mg total) by mouth daily. 30 capsule 11   hydrOXYzine (VISTARIL) 25 MG capsule Take 25 mg by mouth once.     montelukast (SINGULAIR) 10 MG tablet TAKE 1 TABLET BY MOUTH  DAILY 90 tablet 3   OVER THE COUNTER MEDICATION Take 1 tablet by mouth at bedtime. Lions mane extract     pantoprazole (PROTONIX) 40 MG tablet TAKE 1 TABLET BY MOUTH  DAILY 90 tablet 3   polyethylene glycol (MIRALAX / GLYCOLAX) 17 g packet Take 17 g by mouth daily as needed for mild constipation.     pramipexole (MIRAPEX) 0.5 MG tablet Take 1 tablet (0.5 mg total) by mouth 3 (three) times daily. Follow titration instructions provided separately. 270 tablet 3   rosuvastatin (CRESTOR) 10 MG tablet Take 1 tablet (10 mg total) by mouth daily. 90 tablet  3   zolpidem (AMBIEN) 10 MG tablet TAKE 1/2 TO 1 TABLET BY  MOUTH AT BEDTIME AS NEEDED  FOR SLEEP 90 tablet 1   verapamil (VERELAN PM) 120 MG 24 hr capsule Take 1 capsule (120 mg total) by mouth at bedtime. 30 capsule 11   methylPREDNISolone (MEDROL DOSEPAK) 4 MG TBPK tablet As directed (Patient not taking: Reported on 03/03/2021) 21 tablet 0   No facility-administered medications prior to visit.    ROS: Review of Systems  Constitutional:  Positive for fatigue. Negative for activity change, appetite change, chills and unexpected weight change.  HENT:  Positive for congestion, postnasal drip and rhinorrhea. Negative for mouth sores and sinus pressure.   Eyes:  Negative for visual disturbance.  Respiratory:  Negative for cough and chest tightness.   Cardiovascular:  Positive for leg swelling.  Gastrointestinal:  Negative for abdominal pain and nausea.  Genitourinary:  Negative for difficulty urinating, frequency and vaginal pain.  Musculoskeletal:  Positive for arthralgias, back pain and gait problem.  Skin:  Negative for pallor, rash and wound.  Neurological:  Positive for tremors and weakness. Negative for dizziness, syncope, numbness and headaches.  Psychiatric/Behavioral:  Positive for dysphoric mood. Negative for confusion, decreased concentration, sleep disturbance and suicidal ideas.    Objective:  BP 130/76 (BP Location: Left Arm)    Pulse 89    Temp 98.5 F (36.9 C) (Oral)    SpO2 99%   BP Readings from Last 3 Encounters:  03/03/21 130/76  02/22/21 116/61  01/07/21 (!) 158/88    Wt Readings from Last 3 Encounters:  02/21/21 175 lb (79.4 kg)  01/07/21 156 lb 9.6 oz (71 kg)  12/10/20 159 lb (72.1 kg)    Physical Exam Constitutional:      General: She is not in acute distress.    Appearance: She is well-developed. She is obese. She is not toxic-appearing.  HENT:     Head: Normocephalic.     Right Ear: External ear normal.     Left Ear: External ear normal.     Nose:  Nose normal.  Eyes:     General:        Right eye: No discharge.        Left eye: No discharge.     Conjunctiva/sclera: Conjunctivae normal.     Pupils: Pupils are equal, round, and reactive to light.  Neck:     Thyroid: No thyromegaly.     Vascular: No JVD.     Trachea: No tracheal deviation.  Cardiovascular:     Rate and Rhythm: Normal rate and regular rhythm.     Heart sounds: Normal heart sounds.  Pulmonary:     Effort: No respiratory distress.     Breath sounds: No stridor. No wheezing, rhonchi or rales.  Abdominal:     General: Bowel sounds are normal. There is no distension.     Palpations: Abdomen is soft. There is no mass.     Tenderness: There is no abdominal tenderness. There is no guarding or rebound.  Musculoskeletal:        General: Swelling present. No tenderness.     Cervical back: Normal range of motion and neck supple. No rigidity.     Right lower leg: Edema present.     Left lower leg: Edema present.  Lymphadenopathy:     Cervical: No cervical adenopathy.  Skin:    Findings: No erythema or rash.  Neurological:     Cranial Nerves: No cranial nerve deficit.     Motor: No abnormal muscle tone.     Coordination: Coordination normal.     Deep Tendon Reflexes: Reflexes normal.  Psychiatric:        Behavior: Behavior normal.        Thought Content: Thought content normal.        Judgment: Judgment normal.  In a w/c Feet w/mostly non-pitting edema   Lab Results  Component Value Date   WBC 8.1 02/21/2021   HGB 12.4 02/21/2021   HCT 38.9 02/21/2021   PLT 289 02/21/2021   GLUCOSE 116 (H) 02/21/2021   CHOL 200 (H) 01/07/2021   TRIG 53 01/07/2021   HDL 79 01/07/2021   LDLDIRECT 109.8 02/10/2010   LDLCALC 111 (H) 01/07/2021   ALT 2 06/25/2020   AST 11 06/25/2020   NA 141 02/21/2021   K 3.7 02/21/2021   CL 103 02/21/2021   CREATININE 0.94 02/21/2021   BUN 16 02/21/2021   CO2 34 (H) 02/21/2021   TSH 0.67 06/25/2020   INR 0.97 02/07/2014   HGBA1C 5.5  06/25/2020    No results found.  Assessment & Plan:   Problem List Items Addressed This Visit     B12 deficiency    On B12 inj      Relevant  Medications   verapamil (VERELAN PM) 100 MG 24 hr capsule   Cough - Primary   Relevant Orders   POC COVID-19 BinaxNow   Edema    Hold Verapamil due to swelling. Reduce dose Hold HCTZ. Take Furosemide      Essential hypertension    BP Readings from Last 3 Encounters:  03/03/21 130/76  02/22/21 116/61  01/07/21 (!) 158/88  Hold Verapamil due to swelling. Reduce dose Hold HCTZ. Take Furosemide       Relevant Medications   verapamil (VERELAN PM) 100 MG 24 hr capsule   furosemide (LASIX) 20 MG tablet   Low back pain    Per [facility]      Parkinson disease ([place])    Pt needs a w/c      Upper respiratory infection    Use Phenergan Rx cough syr prn         Meds ordered this encounter  Medications   verapamil (VERELAN PM) 100 MG 24 hr capsule    Sig: Take 1 capsule (100 mg total) by mouth at bedtime.    Dispense:  90 capsule    Refill:  3   promethazine-dextromethorphan (PROMETHAZINE-DM) 6.25-15 MG/5ML syrup    Sig: Take 5 mLs by mouth 4 (four) times daily as needed for cough.    Dispense:  240 mL    Refill:  0   furosemide (LASIX) 20 MG tablet    Sig: Take 1-2 tablets (20-40 mg total) by mouth daily as needed.    Dispense:  60 tablet    Refill:  3      Follow-up: Return in about 3 months (around 06/01/2021) for a follow-up visit.  Walker Kehr, MD

## 2021-03-12 NOTE — Telephone Encounter (Signed)
Appointment scheduled for 03/14/21 at 2pm with DR Jenny Reichmann

## 2021-03-12 NOTE — Telephone Encounter (Signed)
Patient states she had an ov w/ provider on 03-03-2021 due to swellingin feet and legs, patient states provider discontinued her bp medication which helped reduce the swelling  Patient states the swelling in her feet and legs has returned has returned  Patient requesting provider's advise on what to do regarding the swelling   Please advise

## 2021-03-14 ENCOUNTER — Other Ambulatory Visit: Payer: Self-pay

## 2021-03-14 ENCOUNTER — Ambulatory Visit (INDEPENDENT_AMBULATORY_CARE_PROVIDER_SITE_OTHER): Payer: Medicare Other | Admitting: Internal Medicine

## 2021-03-14 ENCOUNTER — Encounter: Payer: Self-pay | Admitting: Internal Medicine

## 2021-03-14 VITALS — BP 122/82 | HR 75 | Ht 62.0 in | Wt 165.0 lb

## 2021-03-14 DIAGNOSIS — M79604 Pain in right leg: Secondary | ICD-10-CM

## 2021-03-14 DIAGNOSIS — I10 Essential (primary) hypertension: Secondary | ICD-10-CM | POA: Diagnosis not present

## 2021-03-14 DIAGNOSIS — M1711 Unilateral primary osteoarthritis, right knee: Secondary | ICD-10-CM

## 2021-03-14 DIAGNOSIS — R609 Edema, unspecified: Secondary | ICD-10-CM

## 2021-03-14 DIAGNOSIS — M7989 Other specified soft tissue disorders: Secondary | ICD-10-CM

## 2021-03-14 DIAGNOSIS — R739 Hyperglycemia, unspecified: Secondary | ICD-10-CM | POA: Diagnosis not present

## 2021-03-14 MED ORDER — VERAPAMIL HCL ER 180 MG PO TBCR: 180.0000 mg | EXTENDED_RELEASE_TABLET | Freq: Every day | ORAL | 3 refills | Status: DC

## 2021-03-14 MED ORDER — FUROSEMIDE 20 MG PO TABS: 20.0000 mg | ORAL_TABLET | Freq: Every day | ORAL | 3 refills | Status: DC | PRN

## 2021-03-14 NOTE — Patient Instructions (Addendum)
Ok to continue the verapamil at 180 mg as you just refilled and do not prefer to change  Ok to take the lasix as he prescribed jan 9 at your last visit and NOT TAKE the HCTZ fluid pill  Please continue all other medications as before, and refills have been done if requested.  Please have the pharmacy call with any other refills you may need.  Please keep your appointments with your specialists as you may have planned  You will be contacted regarding the referral for: stat leg venous doppler  Please see Dr Alain Marion in 2 weeks

## 2021-03-14 NOTE — Progress Notes (Signed)
Patient ID: Mallory Klein, female   DOB: [birth year], 76 y.o.   MRN: [id number]        Chief Complaint: follow up 2 mo persistent RLE swelling       HPI:  Mallory Klein is a 76 y.o. female here with hx of right knee TKR approx 7 yrs ago having at least mild chronic pain since then, until 2 mo ago with gradually worsening right leg swelling and pain, not clear to her if starts with the knee, but without giveaways or falls, walks with cane, and not sure if pain worse with walking.  Was seen jan 9 per pcp with confusing instructions at end visit, with documented to hold verapamil but actually verapamil decreased to 100 mg and not communicated well to her per pt.  Also currently not taking hct or lasix as was confused about this as well.  BP has been mild elevated so had a recent refill of the prior verapamil xl 180 qd and taking that.  Pt denies chest pain, increased sob or doe, wheezing, orthopnea, PND, palpitations, dizziness or syncope.   Pt denies fever, wt loss, night sweats, loss of appetite, or other constitutional symptoms  Denies worsening depressive symptoms, suicidal ideation, or panic.         Wt Readings from Last 3 Encounters:  03/14/21 165 lb (74.8 kg)  02/21/21 175 lb (79.4 kg)  01/07/21 156 lb 9.6 oz (71 kg)   BP Readings from Last 3 Encounters:  03/14/21 122/82  03/03/21 130/76  02/22/21 116/61         Past Medical History:  Diagnosis Date   Abnormal CBC 02/05/2015   [name] 12/16 new: normocytic anemia and thrombocytosis   Adjustment disorder with mixed anxiety and depressed mood 02/15/2007   Chronic  Chronic pain Grief, stress Effexor XR  Dad died in 19-Feb-2023   Asthma    Diverticulosis of colon (without mention of hemorrhage)    Esophageal stricture    Essential hypertension 01/26/2007   Chronic Verapamil    Falls    "I blackout"   GERD (gastroesophageal reflux disease) 02/15/2007   Chronic     Heart murmur    Hyperglycemia 03/02/2011   Mild     Insomnia  disorder 12/08/2018   10/20 Carbid/Lev dose was increased - c/o hard time falling asleep (6 am) getting up at 12 am, poor sleep. Try Temazepam 15-30 mg at 11-1:30 pm    Internal hemorrhoids without mention of complication    Iron deficiency anemia 04/23/2017   Mild neurocognitive disorder, likely due to Parkinson's disease 02/09/2019   OSA (obstructive sleep apnea) 02/14/2010   In lab study (May 2018): AHI 25. Only had 45 minutes of sleep secondary to frequent awakenings secondary to sleep apnea. autocpap 5-15 cm water.    Osteoarthritis    Parkinson disease ([place]) 2014-02-18   2015 2017  Primidone - d/c 2019 Parkinson's: [facility] IR. Dr Tat   Postherpetic trigeminal neuralgia 01/26/2007   Qualifier: Diagnosis of  By: Marca Ancona RMA, [name]     Shortness of breath dyspnea    with allergies   Thrombocytosis 05/24/2017   Urinary incontinence 01/26/2007   Chronic  10/17 Detrol LA    Vitamin D deficiency 01/26/2019   Past Surgical History:  Procedure Laterality Date   ABDOMINAL HYSTERECTOMY     BREAST CYST EXCISION Left 1980's   LUMBAR LAMINECTOMY/DECOMPRESSION MICRODISCECTOMY Bilateral 02/20/2014   Procedure: LUMBAR TWO THREE, LUMBAR THREE FOUR LUMBAR LAMINECTOMY/DECOMPRESSION MICRODISCECTOMY 2 LEVELS;  Surgeon: Eustace Moore, MD;  Location: [facility]S;  Service: Neurosurgery;  Laterality: Bilateral;   SPLENECTOMY     TIBIA FRACTURE SURGERY Right    TIBIA FRACTURE SURGERY Left    TOTAL KNEE ARTHROPLASTY Right 2013    reports that she has never smoked. She has never used smokeless tobacco. She reports current alcohol use of about 2.0 standard drinks per week. She reports that she does not use drugs. family history includes Asthma in her mother; Dementia in her mother; Diabetes in her mother; Diabetes type II in her mother; Hypertension in her mother; Mental illness in her father; Prostate cancer in her father; Stroke in her brother; Suicidality in her son. Allergies  Allergen Reactions   Ace  Inhibitors     Patient doesn't recall reaction.  jkl   Benicar [Olmesartan]     It made her sick   Aspirin Other (See Comments)    bruising   Citalopram Hydrobromide Diarrhea   Current Outpatient Medications on File Prior to Visit  Medication Sig Dispense Refill   buprenorphine (BUTRANS) 7.5 MCG/HR 1 patch once a week.     celecoxib (CELEBREX) 200 MG capsule Take 1 capsule (200 mg total) by mouth 2 (two) times daily as needed for moderate pain. 60 capsule 3   cetirizine (ZYRTEC) 10 MG tablet TAKE 1 TABLET BY MOUTH EVERY DAY 90 tablet 3   Cholecalciferol 25 MCG (1000 UT) tablet Take by mouth.     [facility] SENNA PLUS 8.6-50 MG tablet Take 1 tablet by mouth 2 (two) times daily.     cyanocobalamin (,VITAMIN B-12,) 1000 MCG/ML injection INJECT 1 ML INTO THE SKIN EVERY 14 DAYS 10 mL 3   diclofenac Sodium (VOLTAREN) 1 % GEL Apply 4 g topically daily as needed (pain).     docusate sodium (COLACE) 100 MG capsule Take 100 mg by mouth at bedtime.     hydrOXYzine (VISTARIL) 25 MG capsule Take 25 mg by mouth once.     montelukast (SINGULAIR) 10 MG tablet TAKE 1 TABLET BY MOUTH  DAILY 90 tablet 3   OVER THE COUNTER MEDICATION Take 1 tablet by mouth at bedtime. Lions mane extract     pantoprazole (PROTONIX) 40 MG tablet TAKE 1 TABLET BY MOUTH  DAILY 90 tablet 3   polyethylene glycol (MIRALAX / GLYCOLAX) 17 g packet Take 17 g by mouth daily as needed for mild constipation.     pramipexole (MIRAPEX) 0.5 MG tablet Take 1 tablet (0.5 mg total) by mouth 3 (three) times daily. Follow titration instructions provided separately. 270 tablet 3   promethazine-dextromethorphan (PROMETHAZINE-DM) 6.25-15 MG/5ML syrup Take 5 mLs by mouth 4 (four) times daily as needed for cough. 240 mL 0   rosuvastatin (CRESTOR) 10 MG tablet Take 1 tablet (10 mg total) by mouth daily. 90 tablet 3   zolpidem (AMBIEN) 10 MG tablet TAKE 1/2 TO 1 TABLET BY  MOUTH AT BEDTIME AS NEEDED  FOR SLEEP 90 tablet 1   [DISCONTINUED] donepezil (ARICEPT) 5  MG tablet Take 1 tablet (5 mg total) by mouth at bedtime. (Patient not taking: No sig reported) 90 tablet 3   [DISCONTINUED] venlafaxine XR (EFFEXOR-XR) 150 MG 24 hr capsule TAKE 1 CAPSULE BY MOUTH  EVERY DAY WITH BREAKFAST (Patient not taking: No sig reported) 90 capsule 3   No current facility-administered medications on file prior to visit.        ROS:  All others reviewed and negative.  Objective  PE:  BP 122/82 (BP Location: Left Arm, Patient Position: Sitting, Cuff Size: Large)    Pulse 75    Ht 5\' 2"  (1.575 m)    Wt 165 lb (74.8 kg)    SpO2 97%    BMI 30.18 kg/m                 Constitutional: Pt appears in NAD               HENT: Head: NCAT.                Right Ear: External ear normal.                 Left Ear: External ear normal.                Eyes: . Pupils are equal, round, and reactive to light. Conjunctivae and EOM are normal               Nose: without d/c or deformity               Neck: Neck supple. Gross normal ROM               Cardiovascular: Normal rate and regular rhythm.                 Pulmonary/Chest: Effort normal and breath sounds without rales or wheezing.                Abd:  Soft, NT, ND, + BS, no organomegaly               Neurological: Pt is alert. At baseline orientation, motor grossly intact               Skin: Skin is warm. No rashes, no other new lesions, LE edema - 2+ RLE whole leg sweling, has right knee mild effusion as well with mild warmth anterolateral with decreased ROM; mild right calf post tender but neg homans sign               Psychiatric: Pt behavior is normal without agitation   Micro: none  Cardiac tracings I have personally interpreted today:  none  Pertinent Radiological findings (summarize): none   Lab Results  Component Value Date   WBC 8.1 02/21/2021   HGB 12.4 02/21/2021   HCT 38.9 02/21/2021   PLT 289 02/21/2021   GLUCOSE 116 (H) 02/21/2021   CHOL 200 (H) 01/07/2021   TRIG 53 01/07/2021   HDL 79 01/07/2021    LDLDIRECT 109.8 02/10/2010   LDLCALC 111 (H) 01/07/2021   ALT 2 06/25/2020   AST 11 06/25/2020   NA 141 02/21/2021   K 3.7 02/21/2021   CL 103 02/21/2021   CREATININE 0.94 02/21/2021   BUN 16 02/21/2021   CO2 34 (H) 02/21/2021   TSH 0.67 06/25/2020   INR 0.97 02/07/2014   HGBA1C 5.5 06/25/2020   Assessment/Plan:  JENAVIVE LAMBOY is a 76 y.o. Black or African American [2] female with  has a past medical history of Abnormal CBC (02/05/2015), Adjustment disorder with mixed anxiety and depressed mood (02/15/2007), Asthma, Diverticulosis of colon (without mention of hemorrhage), Esophageal stricture, Essential hypertension (01/26/2007), Falls, GERD (gastroesophageal reflux disease) (02/15/2007), Heart murmur, Hyperglycemia (03/02/2011), Insomnia disorder (12/08/2018), Internal hemorrhoids without mention of complication, Iron deficiency anemia (04/23/2017), Mild neurocognitive disorder, likely due to Parkinson's disease (02/09/2019), OSA (obstructive sleep apnea) (02/14/2010), Osteoarthritis, Parkinson disease ([place]) (02/09/2014), Postherpetic trigeminal neuralgia (01/26/2007), Shortness of  breath dyspnea, Thrombocytosis (05/24/2017), Urinary incontinence (01/26/2007), and Vitamin D deficiency (01/26/2019).  Arthritis of right knee With ? Worsening acute on chronic right knee pain after remote TKR, consider ortho referral but declines for now  Edema Pt confused about last instructions to hold verapamil but also reduce dose and was unaware of dose change.  For now pt to continue prior verapamil 180, ok to d/c hct and take lasix prn as instructed, also for stat venous doppler r/o DVT, and consider CT abd r/o intraabdominal pathology leading to whole RLE edema, but suspect overall worsening 2 mo right knee swelling more likely to be cause of right leg swelling  Essential hypertension BP Readings from Last 3 Encounters:  03/14/21 122/82  03/03/21 130/76  02/22/21 116/61   Stable, pt to continue  medical treatment calan sr 180   Hyperglycemia Lab Results  Component Value Date   HGBA1C 5.5 06/25/2020   Stable, pt to continue current medical treatment  - diet  Followup: Return in about 2 weeks (around 03/28/2021).  Cathlean Cower, MD 03/15/2021 6:05 AM [facility]

## 2021-03-15 ENCOUNTER — Encounter: Payer: Self-pay | Admitting: Internal Medicine

## 2021-03-15 NOTE — Assessment & Plan Note (Signed)
With ? Worsening acute on chronic right knee pain after remote TKR, consider ortho referral but declines for now

## 2021-03-15 NOTE — Assessment & Plan Note (Signed)
Pt confused about last instructions to hold verapamil but also reduce dose and was unaware of dose change.  For now pt to continue prior verapamil 180, ok to d/c hct and take lasix prn as instructed, also for stat venous doppler r/o DVT, and consider CT abd r/o intraabdominal pathology leading to whole RLE edema, but suspect overall worsening 2 mo right knee swelling more likely to be cause of right leg swelling

## 2021-03-15 NOTE — Assessment & Plan Note (Signed)
BP Readings from Last 3 Encounters:  03/14/21 122/82  03/03/21 130/76  02/22/21 116/61   Stable, pt to continue medical treatment calan sr 180

## 2021-03-15 NOTE — Assessment & Plan Note (Signed)
Lab Results  Component Value Date   HGBA1C 5.5 06/25/2020   Stable, pt to continue current medical treatment  - diet

## 2021-03-17 ENCOUNTER — Telehealth: Payer: Self-pay | Admitting: Internal Medicine

## 2021-03-17 ENCOUNTER — Ambulatory Visit (HOSPITAL_COMMUNITY)
Admission: RE | Admit: 2021-03-17 | Discharge: 2021-03-17 | Disposition: A | Discharge status: Home / Self Care | Payer: Medicare Other | Source: Ambulatory Visit | Attending: Cardiology | Admitting: Cardiology

## 2021-03-17 ENCOUNTER — Other Ambulatory Visit: Payer: Self-pay

## 2021-03-17 DIAGNOSIS — M7989 Other specified soft tissue disorders: Secondary | ICD-10-CM | POA: Insufficient documentation

## 2021-03-17 DIAGNOSIS — M79604 Pain in right leg: Secondary | ICD-10-CM | POA: Diagnosis not present

## 2021-03-17 NOTE — Telephone Encounter (Signed)
Hannah from [place] at [facility] called stating the patient was negative for DVT and and bakers cyst in both legs.

## 2021-03-18 ENCOUNTER — Telehealth: Payer: Self-pay | Admitting: Cardiology

## 2021-03-18 NOTE — Telephone Encounter (Signed)
Returned call to patient who states she was calling to see if Dr. Gardiner Rhyme could review her recent DVT venous study and provider her with his thoughts and recommendations. Advised I would forward her message on to Dr. Gardiner Rhyme for him to review and advise. Patient verbalized understanding.

## 2021-03-18 NOTE — Telephone Encounter (Signed)
Patient was hoping Dr. Gardiner Rhyme could interpret the results from her LE Venous (DVT) imaging study done 03/17/21.   She was incorrectly told by her PCP that the location of the testing was the place to call to get the results. She felt suspicious and would just like Dr. Gardiner Rhyme to tell her what is going on

## 2021-03-19 NOTE — Telephone Encounter (Signed)
Returned call to patient, advised patient of Dr. Newman Nickels comments on results. Patient verbalized understanding. Patient will follow up with her PCP regarding her swelling in her leg and negative DVT results.   Advised patient to call back to office with any issues, questions, or concerns. Patient verbalized understanding.

## 2021-03-19 NOTE — Telephone Encounter (Signed)
Study shows no blood clots in the legs Gerald Stabs

## 2021-03-20 DIAGNOSIS — M47816 Spondylosis without myelopathy or radiculopathy, lumbar region: Secondary | ICD-10-CM | POA: Diagnosis not present

## 2021-03-20 DIAGNOSIS — G894 Chronic pain syndrome: Secondary | ICD-10-CM | POA: Diagnosis not present

## 2021-03-20 DIAGNOSIS — M48061 Spinal stenosis, lumbar region without neurogenic claudication: Secondary | ICD-10-CM | POA: Diagnosis not present

## 2021-03-20 DIAGNOSIS — Z79891 Long term (current) use of opiate analgesic: Secondary | ICD-10-CM | POA: Diagnosis not present

## 2021-03-20 DIAGNOSIS — M1712 Unilateral primary osteoarthritis, left knee: Secondary | ICD-10-CM | POA: Diagnosis not present

## 2021-03-21 ENCOUNTER — Ambulatory Visit (INDEPENDENT_AMBULATORY_CARE_PROVIDER_SITE_OTHER): Payer: Medicare Other | Admitting: Internal Medicine

## 2021-03-21 ENCOUNTER — Encounter: Payer: Self-pay | Admitting: Internal Medicine

## 2021-03-21 ENCOUNTER — Other Ambulatory Visit: Payer: Self-pay

## 2021-03-21 VITALS — BP 138/80 | HR 81 | Ht 62.0 in | Wt 165.0 lb

## 2021-03-21 DIAGNOSIS — E559 Vitamin D deficiency, unspecified: Secondary | ICD-10-CM | POA: Diagnosis not present

## 2021-03-21 DIAGNOSIS — I10 Essential (primary) hypertension: Secondary | ICD-10-CM | POA: Diagnosis not present

## 2021-03-21 DIAGNOSIS — R609 Edema, unspecified: Secondary | ICD-10-CM | POA: Diagnosis not present

## 2021-03-21 NOTE — Assessment & Plan Note (Signed)
Last vitamin D Lab Results  Component Value Date   VD25OH 32.49 06/12/2019   Low, to start oral replacement

## 2021-03-21 NOTE — Progress Notes (Signed)
Patient ID: Mallory Klein, female   DOB: 1945/12/05, 76 y.o.   MRN: [id number]        Chief Complaint: follow up leg swelling persistent       HPI:  Mallory Klein is a 76 y.o. female here with c/o persistent right > left leg swelling no better but now worse, Pt denies chest pain, increased sob or doe, wheezing, orthopnea, PND, palpitations, dizziness or syncope.   Pt denies polydipsia, polyuria, or new focal neuro s/s.   Pt denies fever, wt loss, night sweats, loss of appetite, or other constitutional symptoms  Not taking vit d.        Wt Readings from Last 3 Encounters:  03/21/21 165 lb (74.8 kg)  03/14/21 165 lb (74.8 kg)  02/21/21 175 lb (79.4 kg)   BP Readings from Last 3 Encounters:  03/21/21 138/80  03/14/21 122/82  03/03/21 130/76         Past Medical History:  Diagnosis Date   Abnormal CBC 02/05/2015   [name] 12/16 new: normocytic anemia and thrombocytosis   Adjustment disorder with mixed anxiety and depressed mood 02/15/2007   Chronic  Chronic pain Grief, stress Effexor XR  Dad died in 2023-02-19   Asthma    Diverticulosis of colon (without mention of hemorrhage)    Esophageal stricture    Essential hypertension 01/26/2007   Chronic Verapamil    Falls    "I blackout"   GERD (gastroesophageal reflux disease) 02/15/2007   Chronic     Heart murmur    Hyperglycemia 03/02/2011   Mild     Insomnia disorder 12/08/2018   10/20 Carbid/Lev dose was increased - c/o hard time falling asleep (6 am) getting up at 12 am, poor sleep. Try Temazepam 15-30 mg at 11-1:30 pm    Internal hemorrhoids without mention of complication    Iron deficiency anemia 04/23/2017   Mild neurocognitive disorder, likely due to Parkinson's disease 02/09/2019   OSA (obstructive sleep apnea) 02/14/2010   In lab study (May 2018): AHI 25. Only had 45 minutes of sleep secondary to frequent awakenings secondary to sleep apnea. autocpap 5-15 cm water.    Osteoarthritis    Parkinson disease ([place])  02/18/14   2015 2017  Primidone - d/c 2019 Parkinson's: [facility] IR. Dr Tat   Postherpetic trigeminal neuralgia 01/26/2007   Qualifier: Diagnosis of  By: Marca Ancona RMA, [name]     Shortness of breath dyspnea    with allergies   Thrombocytosis 05/24/2017   Urinary incontinence 01/26/2007   Chronic  10/17 Detrol LA    Vitamin D deficiency 01/26/2019   Past Surgical History:  Procedure Laterality Date   ABDOMINAL HYSTERECTOMY     BREAST CYST EXCISION Left 1980's   LUMBAR LAMINECTOMY/DECOMPRESSION MICRODISCECTOMY Bilateral 02/20/2014   Procedure: LUMBAR TWO THREE, LUMBAR THREE FOUR LUMBAR LAMINECTOMY/DECOMPRESSION MICRODISCECTOMY 2 LEVELS;  Surgeon: Eustace Moore, MD;  Location: [place] NEURO ORS;  Service: Neurosurgery;  Laterality: Bilateral;   SPLENECTOMY     TIBIA FRACTURE SURGERY Right    TIBIA FRACTURE SURGERY Left    TOTAL KNEE ARTHROPLASTY Right 2013    reports that she has never smoked. She has never used smokeless tobacco. She reports current alcohol use of about 2.0 standard drinks per week. She reports that she does not use drugs. family history includes Asthma in her mother; Dementia in her mother; Diabetes in her mother; Diabetes type II in her mother; Hypertension in her mother; Mental illness in her father; Prostate cancer in  her father; Stroke in her brother; Suicidality in her son. Allergies  Allergen Reactions   Ace Inhibitors     Patient doesn't recall reaction.  jkl   Benicar [Olmesartan]     It made her sick   Aspirin Other (See Comments)    bruising   Citalopram Hydrobromide Diarrhea   Current Outpatient Medications on File Prior to Visit  Medication Sig Dispense Refill   buprenorphine (BUTRANS) 7.5 MCG/HR 1 patch once a week.     celecoxib (CELEBREX) 200 MG capsule Take 1 capsule (200 mg total) by mouth 2 (two) times daily as needed for moderate pain. 60 capsule 3   cetirizine (ZYRTEC) 10 MG tablet TAKE 1 TABLET BY MOUTH EVERY DAY 90 tablet 3   Cholecalciferol 25 MCG  (1000 UT) tablet Take by mouth.     [facility] SENNA PLUS 8.6-50 MG tablet Take 1 tablet by mouth 2 (two) times daily.     cyanocobalamin (,VITAMIN B-12,) 1000 MCG/ML injection INJECT 1 ML INTO THE SKIN EVERY 14 DAYS 10 mL 3   diclofenac Sodium (VOLTAREN) 1 % GEL Apply 4 g topically daily as needed (pain).     docusate sodium (COLACE) 100 MG capsule Take 100 mg by mouth at bedtime.     furosemide (LASIX) 20 MG tablet Take 1-2 tablets (20-40 mg total) by mouth daily as needed. 60 tablet 3   hydrOXYzine (VISTARIL) 25 MG capsule Take 25 mg by mouth once.     montelukast (SINGULAIR) 10 MG tablet TAKE 1 TABLET BY MOUTH  DAILY 90 tablet 3   OVER THE COUNTER MEDICATION Take 1 tablet by mouth at bedtime. Lions mane extract     pantoprazole (PROTONIX) 40 MG tablet TAKE 1 TABLET BY MOUTH  DAILY 90 tablet 3   polyethylene glycol (MIRALAX / GLYCOLAX) 17 g packet Take 17 g by mouth daily as needed for mild constipation.     pramipexole (MIRAPEX) 0.5 MG tablet Take 1 tablet (0.5 mg total) by mouth 3 (three) times daily. Follow titration instructions provided separately. 270 tablet 3   promethazine-dextromethorphan (PROMETHAZINE-DM) 6.25-15 MG/5ML syrup Take 5 mLs by mouth 4 (four) times daily as needed for cough. 240 mL 0   rosuvastatin (CRESTOR) 10 MG tablet Take 1 tablet (10 mg total) by mouth daily. 90 tablet 3   verapamil (CALAN-SR) 180 MG CR tablet Take 1 tablet (180 mg total) by mouth at bedtime. 90 tablet 3   zolpidem (AMBIEN) 10 MG tablet TAKE 1/2 TO 1 TABLET BY  MOUTH AT BEDTIME AS NEEDED  FOR SLEEP 90 tablet 1   [DISCONTINUED] donepezil (ARICEPT) 5 MG tablet Take 1 tablet (5 mg total) by mouth at bedtime. (Patient not taking: No sig reported) 90 tablet 3   [DISCONTINUED] venlafaxine XR (EFFEXOR-XR) 150 MG 24 hr capsule TAKE 1 CAPSULE BY MOUTH  EVERY DAY WITH BREAKFAST (Patient not taking: No sig reported) 90 capsule 3   No current facility-administered medications on file prior to visit.        ROS:  All  others reviewed and negative.  Objective        PE:  BP 138/80 (BP Location: Left Arm, Patient Position: Sitting, Cuff Size: Normal)    Pulse 81    Ht 5\' 2"  (1.575 m)    Wt 165 lb (74.8 kg)    SpO2 95%    BMI 30.18 kg/m                 Constitutional: Pt appears in NAD  HENT: Head: NCAT.                Right Ear: External ear normal.                 Left Ear: External ear normal.                Eyes: . Pupils are equal, round, and reactive to light. Conjunctivae and EOM are normal               Nose: without d/c or deformity               Neck: Neck supple. Gross normal ROM               Cardiovascular: Normal rate and regular rhythm.                 Pulmonary/Chest: Effort normal and breath sounds without rales or wheezing.                Abd:  Soft, NT, ND, + BS, no organomegaly               Neurological: Pt is alert. At baseline orientation, motor grossly intact               Skin: Skin is warm. No rashes, no other new lesions, LE edema - right > left 1-2+ edema               Psychiatric: Pt behavior is normal without agitation   Micro: none  Cardiac tracings I have personally interpreted today:  none  Pertinent Radiological findings (summarize): none   Lab Results  Component Value Date   WBC 8.1 02/21/2021   HGB 12.4 02/21/2021   HCT 38.9 02/21/2021   PLT 289 02/21/2021   GLUCOSE 116 (H) 02/21/2021   CHOL 200 (H) 01/07/2021   TRIG 53 01/07/2021   HDL 79 01/07/2021   LDLDIRECT 109.8 02/10/2010   LDLCALC 111 (H) 01/07/2021   ALT 2 06/25/2020   AST 11 06/25/2020   NA 141 02/21/2021   K 3.7 02/21/2021   CL 103 02/21/2021   CREATININE 0.94 02/21/2021   BUN 16 02/21/2021   CO2 34 (H) 02/21/2021   TSH 0.67 06/25/2020   INR 0.97 02/07/2014   HGBA1C 5.5 06/25/2020   Assessment/Plan:  Mallory Klein is a 76 y.o. Black or African American [2] female with  has a past medical history of Abnormal CBC (02/05/2015), Adjustment disorder with mixed anxiety and  depressed mood (02/15/2007), Asthma, Diverticulosis of colon (without mention of hemorrhage), Esophageal stricture, Essential hypertension (01/26/2007), Falls, GERD (gastroesophageal reflux disease) (02/15/2007), Heart murmur, Hyperglycemia (03/02/2011), Insomnia disorder (12/08/2018), Internal hemorrhoids without mention of complication, Iron deficiency anemia (04/23/2017), Mild neurocognitive disorder, likely due to Parkinson's disease (02/09/2019), OSA (obstructive sleep apnea) (02/14/2010), Osteoarthritis, Parkinson disease ([place]) (02/09/2014), Postherpetic trigeminal neuralgia (01/26/2007), Shortness of breath dyspnea, Thrombocytosis (05/24/2017), Urinary incontinence (01/26/2007), and Vitamin D deficiency (01/26/2019).  Edema Persistent righ t> left c/w venous insufficiency, ok to continue lasix 20 qd prn, but also leg elevation, low salt diet, compresison stockings, wt control,  to f/u any worsening symptoms or concerns   Essential hypertension BP Readings from Last 3 Encounters:  03/21/21 138/80  03/14/21 122/82  03/03/21 130/76   Stable, pt to continue medical treatment calan sr   Vitamin D deficiency Last vitamin D Lab Results  Component Value Date   VD25OH 32.49 06/12/2019   Low, to start oral replacement  Followup:  Return in about 3 months (around 06/19/2021).  Cathlean Cower, MD 03/21/2021 10:23 PM [facility]

## 2021-03-21 NOTE — Assessment & Plan Note (Signed)
Persistent righ t> left c/w venous insufficiency, ok to continue lasix 20 qd prn, but also leg elevation, low salt diet, compresison stockings, wt control,  to f/u any worsening symptoms or concerns

## 2021-03-21 NOTE — Patient Instructions (Signed)
Please continue all other medications as before, and refills have been done if requested.  Please have the pharmacy call with any other refills you may need.  Please continue your efforts at being more active, low cholesterol diet, and weight control.  Please keep your appointments with your specialists as you may have planned  Please see Dr Alain Marion in 3 months, or sooner if needed

## 2021-03-21 NOTE — Assessment & Plan Note (Signed)
BP Readings from Last 3 Encounters:  03/21/21 138/80  03/14/21 122/82  03/03/21 130/76   Stable, pt to continue medical treatment calan sr

## 2021-03-25 ENCOUNTER — Ambulatory Visit (INDEPENDENT_AMBULATORY_CARE_PROVIDER_SITE_OTHER): Payer: Medicare Other | Admitting: Family Medicine

## 2021-03-25 ENCOUNTER — Encounter: Payer: Self-pay | Admitting: Family Medicine

## 2021-03-25 VITALS — BP 159/79 | HR 101 | Ht 62.0 in | Wt 162.0 lb

## 2021-03-25 DIAGNOSIS — G20A1 Parkinson's disease without dyskinesia, without mention of fluctuations: Secondary | ICD-10-CM

## 2021-03-25 DIAGNOSIS — G20 Parkinson's disease: Secondary | ICD-10-CM | POA: Diagnosis not present

## 2021-03-25 DIAGNOSIS — R269 Unspecified abnormalities of gait and mobility: Secondary | ICD-10-CM

## 2021-03-25 DIAGNOSIS — Z9181 History of falling: Secondary | ICD-10-CM | POA: Diagnosis not present

## 2021-03-25 NOTE — Patient Instructions (Addendum)
Below is our plan:  We will increase the Mirapex to 0.75 mg TID. For now take one and a half tablets of the 0.5 mg tablets.   Please make sure you are staying well hydrated. I recommend 50-60 ounces daily. Well balanced diet and regular exercise encouraged. Consistent sleep schedule with 6-8 hours recommended.   Please continue follow up with care team as directed.   Follow up with Dr. Rexene Alberts in 4 months or sooner if needed  You may receive a survey regarding today's visit. I encourage you to leave honest feed back as I do use this information to improve patient care. Thank you for seeing me today!

## 2021-03-25 NOTE — Progress Notes (Signed)
Chief Complaint  Patient presents with   Follow-up    Rm 10, w husband. Here for 3 month PD f/u. Pt reports tolerating mirapex but has not noticed any changes. Tremors continue to worsen. On lasix for L leg swelling.      HISTORY OF PRESENT ILLNESS:  03/26/21 ALL:  Mallory Klein is a 76 y.o. female here today for follow up for primary parkinsonism. During her last visit with Dr. Rexene Alberts, she was switched to Mirapex 0.125 mg 1 pill daily and titrate up to 3 pills 3 times daily. In November she continued to have shaking and Dr. Rexene Alberts increased her dose to  0.5 mg TID. She was also referred to neuro rehab for PT.   She reports that she was unable to start PT due to cost. Her pain doctor also referred her but she is unable to afford the copayment. She feels that she is doing fairly well. She continues to note r hand >left hand tremor. She is unsure if Mirapex is helping much but reports that she is tolerating it well with no obvious adverse effects. She is sleeping well. Memory stable. She ambulates with Rolator. She has had 4 falls over the past year, none recently.    HISTORY (copied from previous note) Mallory Klein is a 76 year old right-handed woman with an underlying medical history of tremors, sleep apnea, iron deficiency anemia, vitamin D deficiency, low back pain, with history of lumbar degenerative spine disease, followed by pain management, reflux disease, hypertension, diverticulosis, asthma, and mild obesity, who presents for follow-up consultation of her Parkinson's disease, for her 2-month checkup.  The patient is accompanied by her husband again today. I last saw her on 05/22/2020, at which time she reported a few falls.  She had seen Dr. Redmond Baseman for anosmia.  He ordered a brain MRI.  She had a brain MRI with and without contrast on 05/27/2020 and I reviewed the results: IMPRESSION: 1. No acute intracranial abnormality. 2. Moderately advanced but nonspecific cerebral white matter  signal changes, most commonly due to chronic small vessel disease. 3. Olfactory bulbs are not identified suspicious for atrophy. 4. Mild paranasal sinus inflammation. 5. C3-C4 disc degeneration with mild cervical spinal stenosis.   She was not eating very well.  She had significant nausea.  She was also struggling with constipation and saw GI.   She was advised to continue with Sinemet CR 1 pill 3 times daily. SA 11/19/2020: She reports worsening tremors, she has reduced her Sinemet CR to 1 pill twice daily.  She reports that we discussed it at the last visit.  We actually did not plan on reducing her Sinemet CR to twice daily.  At any rate, she is taking it twice daily, she has noticed changes in her posture and her balance.  She has had several falls.  She reports a history of syncope and collapse.  She has seen cardiology recently.  She reports that she lost consciousness.  She had a Holter monitor.  She is in the process of being reevaluated for her sleep apnea as she has a prior diagnosis but has not used her CPAP in years. She no longer has the machine even.  She is scheduled at [facility] to undergo a sleep study per cardiology.  She does report that her falls occurred when she stood up.  She had cardiac work-up in addition with echocardiogram and an EKG.    REVIEW OF SYSTEMS: Out of a complete 14 system  review of symptoms, the patient complains only of the following symptoms, leg swelling, tremor, and all other reviewed systems are negative.   ALLERGIES: Allergies  Allergen Reactions   Ace Inhibitors     Patient doesn't recall reaction.  jkl   Benicar [Olmesartan]     It made her sick   Aspirin Other (See Comments)    bruising   Citalopram Hydrobromide Diarrhea     HOME MEDICATIONS: Outpatient Medications Prior to Visit  Medication Sig Dispense Refill   buprenorphine (BUTRANS) 7.5 MCG/HR 1 patch once a week.     celecoxib (CELEBREX) 200 MG capsule Take 1 capsule (200  mg total) by mouth 2 (two) times daily as needed for moderate pain. 60 capsule 3   cetirizine (ZYRTEC) 10 MG tablet TAKE 1 TABLET BY MOUTH EVERY DAY 90 tablet 3   Cholecalciferol 25 MCG (1000 UT) tablet Take by mouth.     [facility] SENNA PLUS 8.6-50 MG tablet Take 1 tablet by mouth 2 (two) times daily.     cyanocobalamin (,VITAMIN B-12,) 1000 MCG/ML injection INJECT 1 ML INTO THE SKIN EVERY 14 DAYS 10 mL 3   diclofenac Sodium (VOLTAREN) 1 % GEL Apply 4 g topically daily as needed (pain).     docusate sodium (COLACE) 100 MG capsule Take 100 mg by mouth at bedtime.     furosemide (LASIX) 20 MG tablet Take 1-2 tablets (20-40 mg total) by mouth daily as needed. 60 tablet 3   hydrOXYzine (VISTARIL) 25 MG capsule Take 25 mg by mouth once.     montelukast (SINGULAIR) 10 MG tablet TAKE 1 TABLET BY MOUTH  DAILY 90 tablet 3   OVER THE COUNTER MEDICATION Take 1 tablet by mouth at bedtime. Lions mane extract     pantoprazole (PROTONIX) 40 MG tablet TAKE 1 TABLET BY MOUTH  DAILY 90 tablet 3   polyethylene glycol (MIRALAX / GLYCOLAX) 17 g packet Take 17 g by mouth daily as needed for mild constipation.     promethazine-dextromethorphan (PROMETHAZINE-DM) 6.25-15 MG/5ML syrup Take 5 mLs by mouth 4 (four) times daily as needed for cough. 240 mL 0   rosuvastatin (CRESTOR) 10 MG tablet Take 1 tablet (10 mg total) by mouth daily. 90 tablet 3   verapamil (CALAN-SR) 180 MG CR tablet Take 1 tablet (180 mg total) by mouth at bedtime. 90 tablet 3   zolpidem (AMBIEN) 10 MG tablet TAKE 1/2 TO 1 TABLET BY  MOUTH AT BEDTIME AS NEEDED  FOR SLEEP 90 tablet 1   pramipexole (MIRAPEX) 0.5 MG tablet Take 1 tablet (0.5 mg total) by mouth 3 (three) times daily. Follow titration instructions provided separately. 270 tablet 3   No facility-administered medications prior to visit.     PAST MEDICAL HISTORY: Past Medical History:  Diagnosis Date   Abnormal CBC 02/05/2015   [name] 12/16 new: normocytic anemia and thrombocytosis    Adjustment disorder with mixed anxiety and depressed mood 02/15/2007   Chronic  Chronic pain Grief, stress Effexor XR  Dad died in 21-Feb-2023   Asthma    Diverticulosis of colon (without mention of hemorrhage)    Esophageal stricture    Essential hypertension 01/26/2007   Chronic Verapamil    Falls    "I blackout"   GERD (gastroesophageal reflux disease) 02/15/2007   Chronic     Heart murmur    Hyperglycemia 03/02/2011   Mild     Insomnia disorder 12/08/2018   10/20 Carbid/Lev dose was increased - c/o hard time falling asleep (  6 am) getting up at 12 am, poor sleep. Try Temazepam 15-30 mg at 11-1:30 pm    Internal hemorrhoids without mention of complication    Iron deficiency anemia 04/23/2017   Mild neurocognitive disorder, likely due to Parkinson's disease 02/09/2019   OSA (obstructive sleep apnea) 02/14/2010   In lab study (May 2018): AHI 25. Only had 45 minutes of sleep secondary to frequent awakenings secondary to sleep apnea. autocpap 5-15 cm water.    Osteoarthritis    Parkinson disease ([place]) 02/09/2014   2015 2017  Primidone - d/c 2019 Parkinson's: Sinmet IR. Dr Tat   Postherpetic trigeminal neuralgia 01/26/2007   Qualifier: Diagnosis of  By: Marca Ancona RMA, [name]     Shortness of breath dyspnea    with allergies   Thrombocytosis 05/24/2017   Urinary incontinence 01/26/2007   Chronic  10/17 Detrol LA    Vitamin D deficiency 01/26/2019     PAST SURGICAL HISTORY: Past Surgical History:  Procedure Laterality Date   ABDOMINAL HYSTERECTOMY     BREAST CYST EXCISION Left 1980's   LUMBAR LAMINECTOMY/DECOMPRESSION MICRODISCECTOMY Bilateral 02/20/2014   Procedure: LUMBAR TWO THREE, LUMBAR THREE FOUR LUMBAR LAMINECTOMY/DECOMPRESSION MICRODISCECTOMY 2 LEVELS;  Surgeon: Eustace Moore, MD;  Location: [facility];  Service: Neurosurgery;  Laterality: Bilateral;   SPLENECTOMY     TIBIA FRACTURE SURGERY Right    TIBIA FRACTURE SURGERY Left    TOTAL KNEE ARTHROPLASTY Right 2013     FAMILY  HISTORY: Family History  Problem Relation Age of Onset   Hypertension Mother    Diabetes type II Mother    Asthma Mother    Diabetes Mother    Dementia Mother    Prostate cancer Father    Mental illness Father        dementia   Suicidality Son    Stroke Brother        died in prison   Colon cancer Neg Hx    Rectal cancer Neg Hx      SOCIAL HISTORY: Social History   Socioeconomic History   Marital status: Married    Spouse name: Not on file   Number of children: 2   Years of education: 14   Highest education level: Associate degree: occupational, Hotel manager, or vocational program  Occupational History   Occupation: Retired    Comment: Network engineer, Youth worker   Tobacco Use   Smoking status: Never   Smokeless tobacco: Never  Vaping Use   Vaping Use: Never used  Substance and Sexual Activity   Alcohol use: Yes    Alcohol/week: 2.0 standard drinks    Types: 2 Glasses of wine per week    Comment: wine on occ    Drug use: No   Sexual activity: Not Currently  Other Topics Concern   Not on file  Social History Narrative   Drink tea and coffee on occ   Right handed   1 story    Lives with spouse   Social Determinants of Health   Financial Resource Strain: Low Risk    Difficulty of Paying Living Expenses: Not hard at all  Food Insecurity: No Food Insecurity   Worried About Charity fundraiser in the Last Year: Never true   Ran Out of Food in the Last Year: Never true  Transportation Needs: No Transportation Needs   Lack of Transportation (Medical): No   Lack of Transportation (Non-Medical): No  Physical Activity: Inactive   Days of Exercise per Week: 0 days   Minutes of  Exercise per Session: 0 min  Stress: No Stress Concern Present   Feeling of Stress : Not at all  Social Connections: Socially Integrated   Frequency of Communication with Friends and Family: More than three times a week   Frequency of Social Gatherings with Friends and Family: More than three  times a week   Attends Religious Services: More than 4 times per year   Active Member of [employer] or Organizations: Yes   Attends Archivist Meetings: More than 4 times per year   Marital Status: Married  Human resources officer Violence: Not on file     PHYSICAL EXAM  Vitals:   03/25/21 1337  BP: (!) 159/79  Pulse: (!) 101  Weight: 162 lb (73.5 kg)  Height: 5\' 2"  (1.575 m)   Body mass index is 29.63 kg/m.  Generalized: Well developed, in no acute distress  Cardiology: normal rate and rhythm, no murmur auscultated  Respiratory: clear to auscultation bilaterally    Neurological examination  Mentation: Alert oriented to time, place, history taking. Follows all commands speech and language fluent Cranial nerve II-XII: Pupils were equal round reactive to light. Extraocular movements were full, visual field were full on confrontational test. Facial sensation and strength were normal. Uvula tongue midline. Head turning and shoulder shrug  were normal and symmetric. Motor: The motor testing reveals 5 over 5 strength of all 4 extremities with the exception of 4/5 on bilateral hip flexion. Good symmetric motor tone is noted throughout.  Sensory: Sensory testing is intact to soft touch on all 4 extremities. No evidence of extinction is noted.  Coordination: Cerebellar testing reveals good finger-nose-finger and heel-to-shin bilaterally.  Gait and station: Gait is cautious and wide with rollator walker. Tandem gait and romberg deferred.   Reflexes: Deep tendon reflexes are symmetric and normal bilaterally.    DIAGNOSTIC DATA (LABS, IMAGING, TESTING) - I reviewed patient records, labs, notes, testing and imaging myself where available.  Lab Results  Component Value Date   WBC 8.1 02/21/2021   HGB 12.4 02/21/2021   HCT 38.9 02/21/2021   MCV 91.1 02/21/2021   PLT 289 02/21/2021      Component Value Date/Time   NA 141 02/21/2021 1550   K 3.7 02/21/2021 1550   CL 103 02/21/2021  1550   CO2 34 (H) 02/21/2021 1550   GLUCOSE 116 (H) 02/21/2021 1550   BUN 16 02/21/2021 1550   CREATININE 0.94 02/21/2021 1550   CREATININE 0.91 09/11/2019 1451   CALCIUM 9.2 02/21/2021 1550   PROT 7.2 06/25/2020 1457   ALBUMIN 3.9 06/25/2020 1457   AST 11 06/25/2020 1457   ALT 2 06/25/2020 1457   ALKPHOS 60 06/25/2020 1457   BILITOT 0.6 06/25/2020 1457   GFRNONAA >60 02/21/2021 1550   GFRAA >60 02/28/2018 1544   Lab Results  Component Value Date   CHOL 200 (H) 01/07/2021   HDL 79 01/07/2021   LDLCALC 111 (H) 01/07/2021   LDLDIRECT 109.8 02/10/2010   TRIG 53 01/07/2021   CHOLHDL 2.5 01/07/2021   Lab Results  Component Value Date   HGBA1C 5.5 06/25/2020   Lab Results  Component Value Date   ZHYQMVHQ46 962 (H) 07/18/2020   Lab Results  Component Value Date   TSH 0.67 06/25/2020    No flowsheet data found.   Montreal Cognitive Assessment  05/13/2018  Visuospatial/ Executive (0/5) 3  Naming (0/3) 2  Attention: Read list of digits (0/2) 2  Attention: Read list of letters (0/1) 0  Attention: Serial  7 subtraction starting at 100 (0/3) 3  Language: Repeat phrase (0/2) 2  Language : Fluency (0/1) 0  Abstraction (0/2) 2  Delayed Recall (0/5) 1  Orientation (0/6) 6  Total 21     ASSESSMENT AND PLAN  76 y.o. year old female  has a past medical history of Abnormal CBC (02/05/2015), Adjustment disorder with mixed anxiety and depressed mood (02/15/2007), Asthma, Diverticulosis of colon (without mention of hemorrhage), Esophageal stricture, Essential hypertension (01/26/2007), Falls, GERD (gastroesophageal reflux disease) (02/15/2007), Heart murmur, Hyperglycemia (03/02/2011), Insomnia disorder (12/08/2018), Internal hemorrhoids without mention of complication, Iron deficiency anemia (04/23/2017), Mild neurocognitive disorder, likely due to Parkinson's disease (02/09/2019), OSA (obstructive sleep apnea) (02/14/2010), Osteoarthritis, Parkinson disease ([place]) (02/09/2014),  Postherpetic trigeminal neuralgia (01/26/2007), Shortness of breath dyspnea, Thrombocytosis (05/24/2017), Urinary incontinence (01/26/2007), and Vitamin D deficiency (01/26/2019). here with    Parkinson disease ([place]) - Plan: pramipexole (MIRAPEX) 0.75 MG tablet  Gait disorder  History of fall  Mallory Klein is doing fairly well. She is tolerating Mirapex without obvious adverse effects, however, has not noticed significant changes in tremor. We will increase the Mirapex to 0.75 mg TID. I have advised she consider PT in the meantime. Cost is not affordable at this time. She is aware to call me if she changes her mind. Fall precautions advised. Healthy lifestyle habits encouraged. I will have her follow up with Dr. Rexene Alberts in 4 months. I anticipate alternating her follow up with MD/NP.   No orders of the defined types were placed in this encounter.  Meds ordered this encounter  Medications   pramipexole (MIRAPEX) 0.75 MG tablet    Sig: Take 1 tablet (0.75 mg total) by mouth 3 (three) times daily.    Dispense:  270 tablet    Refill:  3    I spent 30 minutes of face-to-face and non-face-to-face time with patient.  This included previsit chart review, lab review, study review, order entry, electronic health record documentation, patient education.    Debbora Presto, MSN, [name]-C 03/26/2021, 7:31 AM  [facility] [address], [place], [place] [address] [phone]

## 2021-03-26 MED ORDER — PRAMIPEXOLE DIHYDROCHLORIDE 0.75 MG PO TABS: 0.7500 mg | ORAL_TABLET | Freq: Three times a day (TID) | ORAL | 3 refills | Status: DC

## 2021-03-28 ENCOUNTER — Other Ambulatory Visit: Payer: Self-pay | Admitting: Internal Medicine

## 2021-03-31 ENCOUNTER — Telehealth: Payer: Self-pay | Admitting: Family Medicine

## 2021-03-31 DIAGNOSIS — G20 Parkinson's disease: Secondary | ICD-10-CM

## 2021-03-31 MED ORDER — PRAMIPEXOLE DIHYDROCHLORIDE 0.75 MG PO TABS: 0.7500 mg | ORAL_TABLET | Freq: Three times a day (TID) | ORAL | 3 refills | Status: DC

## 2021-03-31 NOTE — Telephone Encounter (Signed)
Called pt back. Advised AL,NP increased pramipexole from 0.5mg  po TID to 0.75mg  po TID. [facility]. [facility], spoke w/ Melanie. E-scribed rx to [facility] per pt request.

## 2021-03-31 NOTE — Telephone Encounter (Addendum)
At 10:27 this morning pt left a vm asking for a call from RN to clarify the change in her medications, please call.

## 2021-04-02 ENCOUNTER — Other Ambulatory Visit: Payer: Self-pay

## 2021-04-02 ENCOUNTER — Encounter: Payer: Self-pay | Admitting: Internal Medicine

## 2021-04-02 ENCOUNTER — Ambulatory Visit (INDEPENDENT_AMBULATORY_CARE_PROVIDER_SITE_OTHER): Payer: Medicare Other | Admitting: Internal Medicine

## 2021-04-02 DIAGNOSIS — M5440 Lumbago with sciatica, unspecified side: Secondary | ICD-10-CM | POA: Diagnosis not present

## 2021-04-02 DIAGNOSIS — E538 Deficiency of other specified B group vitamins: Secondary | ICD-10-CM | POA: Diagnosis not present

## 2021-04-02 DIAGNOSIS — R609 Edema, unspecified: Secondary | ICD-10-CM | POA: Diagnosis not present

## 2021-04-02 DIAGNOSIS — G8929 Other chronic pain: Secondary | ICD-10-CM

## 2021-04-02 DIAGNOSIS — F19982 Other psychoactive substance use, unspecified with psychoactive substance-induced sleep disorder: Secondary | ICD-10-CM

## 2021-04-02 MED ORDER — ZOLPIDEM TARTRATE 10 MG PO TABS: 5.0000 mg | ORAL_TABLET | Freq: Every evening | ORAL | 1 refills | Status: DC | PRN

## 2021-04-02 NOTE — Progress Notes (Signed)
Subjective:  Patient ID: Mallory Klein, female    DOB: [birth year]  Age: 76 y.o. MRN: [id number]  CC: Follow-up   HPI Mallory Klein presents for leg swelling - not better Lasix did not help w/swelling C/o insomnia She is still on Verapamil  Outpatient Medications Prior to Visit  Medication Sig Dispense Refill   buprenorphine (BUTRANS) 7.5 MCG/HR 1 patch once a week.     celecoxib (CELEBREX) 200 MG capsule Take 1 capsule (200 mg total) by mouth 2 (two) times daily as needed for moderate pain. 60 capsule 3   cetirizine (ZYRTEC) 10 MG tablet TAKE 1 TABLET BY MOUTH EVERY DAY 90 tablet 3   Cholecalciferol 25 MCG (1000 UT) tablet Take by mouth.     [employer] SENNA PLUS 8.6-50 MG tablet Take 1 tablet by mouth 2 (two) times daily.     cyanocobalamin (,VITAMIN B-12,) 1000 MCG/ML injection INJECT 1 ML INTO THE SKIN EVERY 14 DAYS 10 mL 3   diclofenac Sodium (VOLTAREN) 1 % GEL Apply 4 g topically daily as needed (pain).     docusate sodium (COLACE) 100 MG capsule Take 100 mg by mouth at bedtime.     furosemide (LASIX) 20 MG tablet Take 1-2 tablets (20-40 mg total) by mouth daily as needed. 60 tablet 3   hydrOXYzine (VISTARIL) 25 MG capsule Take 25 mg by mouth once.     montelukast (SINGULAIR) 10 MG tablet TAKE 1 TABLET BY MOUTH  DAILY 90 tablet 3   OVER THE COUNTER MEDICATION Take 1 tablet by mouth at bedtime. Lions mane extract     pantoprazole (PROTONIX) 40 MG tablet TAKE 1 TABLET BY MOUTH  DAILY 90 tablet 3   polyethylene glycol (MIRALAX / GLYCOLAX) 17 g packet Take 17 g by mouth daily as needed for mild constipation.     pramipexole (MIRAPEX) 0.75 MG tablet Take 1 tablet (0.75 mg total) by mouth 3 (three) times daily. 270 tablet 3   promethazine-dextromethorphan (PROMETHAZINE-DM) 6.25-15 MG/5ML syrup Take 5 mLs by mouth 4 (four) times daily as needed for cough. 240 mL 0   rosuvastatin (CRESTOR) 10 MG tablet Take 1 tablet (10 mg total) by mouth daily. 90 tablet 3   verapamil (CALAN-SR) 180  MG CR tablet Take 1 tablet (180 mg total) by mouth at bedtime. 90 tablet 3   zolpidem (AMBIEN) 10 MG tablet TAKE 1/2 TO 1 TABLET BY  MOUTH AT BEDTIME AS NEEDED  FOR SLEEP 90 tablet 1   buprenorphine (BUTRANS) 10 MCG/HR PTWK 1 patch once a week.     hydrochlorothiazide (MICROZIDE) 12.5 MG capsule Take 12.5 mg by mouth daily.     No facility-administered medications prior to visit.    ROS: Review of Systems  Constitutional:  Positive for fatigue. Negative for activity change, appetite change, chills and unexpected weight change.  HENT:  Positive for postnasal drip. Negative for congestion, mouth sores and sinus pressure.   Eyes:  Negative for visual disturbance.  Respiratory:  Positive for shortness of breath. Negative for cough and chest tightness.   Cardiovascular:  Positive for leg swelling.  Gastrointestinal:  Negative for abdominal pain and nausea.  Genitourinary:  Negative for difficulty urinating, frequency and vaginal pain.  Musculoskeletal:  Positive for arthralgias, back pain, gait problem, neck pain and neck stiffness.  Skin:  Negative for pallor and rash.  Neurological:  Positive for tremors and weakness. Negative for dizziness, numbness and headaches.  Psychiatric/Behavioral:  Positive for sleep disturbance. Negative for confusion and suicidal  ideas. The patient is nervous/anxious.    Objective:  BP 124/78 (BP Location: Left Arm, Patient Position: Sitting, Cuff Size: Normal)    Pulse 87    Temp 99.3 F (37.4 C) (Oral)    Ht 5\' 2"  (1.575 m)    Wt 168 lb (76.2 kg)    SpO2 98%    BMI 30.73 kg/m   BP Readings from Last 3 Encounters:  04/02/21 124/78  03/25/21 (!) 159/79  03/21/21 138/80    Wt Readings from Last 3 Encounters:  04/02/21 168 lb (76.2 kg)  03/25/21 162 lb (73.5 kg)  03/21/21 165 lb (74.8 kg)      Physical Exam Constitutional:      General: She is not in acute distress.    Appearance: She is well-developed. She is obese.  HENT:     Head: Normocephalic.      Right Ear: External ear normal.     Left Ear: External ear normal.     Nose: Nose normal.  Eyes:     General:        Right eye: No discharge.        Left eye: No discharge.     Conjunctiva/sclera: Conjunctivae normal.     Pupils: Pupils are equal, round, and reactive to light.  Neck:     Thyroid: No thyromegaly.     Vascular: No JVD.     Trachea: No tracheal deviation.  Cardiovascular:     Rate and Rhythm: Normal rate and regular rhythm.     Heart sounds: Normal heart sounds.  Pulmonary:     Effort: No respiratory distress.     Breath sounds: No stridor. No wheezing.  Abdominal:     General: Bowel sounds are normal. There is no distension.     Palpations: Abdomen is soft. There is no mass.     Tenderness: There is no abdominal tenderness. There is no guarding or rebound.  Musculoskeletal:        General: Tenderness present.     Cervical back: Normal range of motion and neck supple. Tenderness present. No rigidity.  Lymphadenopathy:     Cervical: No cervical adenopathy.  Skin:    Findings: No erythema or rash.  Neurological:     Mental Status: She is oriented to person, place, and time.     Cranial Nerves: No cranial nerve deficit.     Motor: Weakness present. No abnormal muscle tone.     Coordination: Coordination abnormal.     Gait: Gait abnormal.     Deep Tendon Reflexes: Reflexes abnormal.  Psychiatric:        Behavior: Behavior normal.        Thought Content: Thought content normal.        Judgment: Judgment normal.   L hand tremor Edema w/o pitting Using a walker   Lab Results  Component Value Date   WBC 8.1 02/21/2021   HGB 12.4 02/21/2021   HCT 38.9 02/21/2021   PLT 289 02/21/2021   GLUCOSE 116 (H) 02/21/2021   CHOL 200 (H) 01/07/2021   TRIG 53 01/07/2021   HDL 79 01/07/2021   LDLDIRECT 109.8 02/10/2010   LDLCALC 111 (H) 01/07/2021   ALT 2 06/25/2020   AST 11 06/25/2020   NA 141 02/21/2021   K 3.7 02/21/2021   CL 103 02/21/2021   CREATININE  0.94 02/21/2021   BUN 16 02/21/2021   CO2 34 (H) 02/21/2021   TSH 0.67 06/25/2020   INR 0.97 02/07/2014  HGBA1C 5.5 06/25/2020    VAS [place] LOWER EXTREMITY VENOUS (DVT)  Result Date: 03/17/2021  Lower Venous DVT Study Patient Name:  Mallory Klein [facility]  Date of Exam:   03/17/2021 Medical Rec #: [id number]          Accession #:    [id number] Date of Birth: 1945-12-27         Patient Gender: F Patient Age:   74 years Exam Location:  [facility] Procedure:      VAS [place] LOWER EXTREMITY VENOUS (DVT) Referring Phys: Cathlean Cower --------------------------------------------------------------------------------  Indications: Patient denies any shortness of breath or chest pain. Patient states swelling in both legs from knee down for the past two months.  Risk Factors: Surgery Total right knee replacement 2013. Comparison Study: NA Performing Technologist: Leavy Cella RDCS  Examination Guidelines: A complete evaluation includes B-mode imaging, spectral Doppler, color Doppler, and power Doppler as needed of all accessible portions of each vessel. Bilateral testing is considered an integral part of a complete examination. Limited examinations for reoccurring indications may be performed as noted. The reflux portion of the exam is performed with the patient in reverse Trendelenburg.  +---------+---------------+---------+-----------+----------+--------------+  RIGHT     Compressibility Phasicity Spontaneity Properties Thrombus Aging  +---------+---------------+---------+-----------+----------+--------------+  CFV       Full            Yes       Yes                                    +---------+---------------+---------+-----------+----------+--------------+  SFJ       Full            Yes       Yes                                    +---------+---------------+---------+-----------+----------+--------------+  FV Prox   Full            Yes       Yes                                     +---------+---------------+---------+-----------+----------+--------------+  FV Mid    Full            Yes       Yes                                    +---------+---------------+---------+-----------+----------+--------------+  FV Distal Full            Yes       Yes                                    +---------+---------------+---------+-----------+----------+--------------+  PFV       Full                                                             +---------+---------------+---------+-----------+----------+--------------+  POP  Full            Yes       Yes                                    +---------+---------------+---------+-----------+----------+--------------+  PTV       Full            Yes       Yes                                    +---------+---------------+---------+-----------+----------+--------------+  PERO      Full            Yes       Yes                                    +---------+---------------+---------+-----------+----------+--------------+  Gastroc   Full                                                             +---------+---------------+---------+-----------+----------+--------------+  GSV       Full            Yes       Yes                                    +---------+---------------+---------+-----------+----------+--------------+   +---------+---------------+---------+-----------+----------+--------------+  LEFT      Compressibility Phasicity Spontaneity Properties Thrombus Aging  +---------+---------------+---------+-----------+----------+--------------+  CFV       Full            Yes       Yes                                    +---------+---------------+---------+-----------+----------+--------------+  SFJ       Full            Yes       Yes                                    +---------+---------------+---------+-----------+----------+--------------+  FV Prox   Full            Yes       Yes                                     +---------+---------------+---------+-----------+----------+--------------+  FV Mid    Full            Yes       Yes                                    +---------+---------------+---------+-----------+----------+--------------+  FV Distal Full            Yes  Yes                                    +---------+---------------+---------+-----------+----------+--------------+  PFV       Full                                                             +---------+---------------+---------+-----------+----------+--------------+  POP       Full            Yes       Yes                                    +---------+---------------+---------+-----------+----------+--------------+  PTV       Full            Yes       Yes                                    +---------+---------------+---------+-----------+----------+--------------+  PERO      Full            Yes       Yes                                    +---------+---------------+---------+-----------+----------+--------------+  Gastroc   Full                                                             +---------+---------------+---------+-----------+----------+--------------+  GSV       Full            Yes       Yes                                    +---------+---------------+---------+-----------+----------+--------------+    Findings reported to Kallie Edward at 2:41pm.  Summary: BILATERAL: - No evidence of deep vein thrombosis seen in the lower extremities, bilaterally. -No evidence of popliteal cyst, bilaterally.   *See table(s) above for measurements and observations. Electronically signed by Berniece Salines DO on 03/17/2021 at 6:12:05 PM.    Final     Assessment & Plan:   Problem List Items Addressed This Visit     B12 deficiency    On B12      Edema    Not better Almost non-pitting edema Do not take Verapamil for 2-3 weeks to see if swelling is better Foam wedge to elevate legs Take Furosemide prn Abd CT if worse      Insomnia disorder     Worse OK to repeat Zolpidem in 4 h prn      Low back pain   Relevant Medications   buprenorphine (BUTRANS) 10 MCG/HR PTWK      Meds ordered this encounter  Medications  zolpidem (AMBIEN) 10 MG tablet    Sig: Take 0.5-1 tablets (5-10 mg total) by mouth at bedtime as needed for sleep. OK to repeat 5-10 mg in 4 hrs after the first dose if needed    Dispense:  180 tablet    Refill:  1      Follow-up: Return in about 6 weeks (around 05/14/2021) for a follow-up visit.  Walker Kehr, MD

## 2021-04-02 NOTE — Assessment & Plan Note (Signed)
On B12 

## 2021-04-02 NOTE — Assessment & Plan Note (Signed)
Worse OK to repeat Zolpidem in 4 h prn

## 2021-04-02 NOTE — Patient Instructions (Addendum)
Do not take Verapamil for 2-3 weeks to see if swelling is better Foam wedge to elevate legs

## 2021-04-02 NOTE — Assessment & Plan Note (Addendum)
Not better Almost non-pitting edema Do not take Verapamil for 2-3 weeks to see if swelling is better Foam wedge to elevate legs Take Furosemide prn Abd CT if worse

## 2021-04-07 ENCOUNTER — Telehealth: Payer: Self-pay | Admitting: *Deleted

## 2021-04-07 NOTE — Progress Notes (Signed)
Cardiology Clinic Note   Patient Name: Mallory Klein Date of Encounter: 04/09/2021  Primary Care Provider:  Cassandria Anger, MD Primary Cardiologist:  None  Patient Profile    Mallory Klein 76 year old female presents to the clinic today for follow-up evaluation of her hypertension.  Past Medical History    Past Medical History:  Diagnosis Date   Abnormal CBC 02/05/2015   [name] 12/16 new: normocytic anemia and thrombocytosis   Adjustment disorder with mixed anxiety and depressed mood 02/15/2007   Chronic  Chronic pain Grief, stress Effexor XR  Dad died in 03-09-23   Asthma    Diverticulosis of colon (without mention of hemorrhage)    Esophageal stricture    Essential hypertension 01/26/2007   Chronic Verapamil    Falls    "I blackout"   GERD (gastroesophageal reflux disease) 02/15/2007   Chronic     Heart murmur    Hyperglycemia 03/02/2011   Mild     Insomnia disorder 12/08/2018   10/20 Carbid/Lev dose was increased - c/o hard time falling asleep (6 am) getting up at 12 am, poor sleep. Try Temazepam 15-30 mg at 11-1:30 pm    Internal hemorrhoids without mention of complication    Iron deficiency anemia 04/23/2017   Mild neurocognitive disorder, likely due to Parkinson's disease 02/09/2019   OSA (obstructive sleep apnea) 02/14/2010   In lab study (May 2018): AHI 25. Only had 45 minutes of sleep secondary to frequent awakenings secondary to sleep apnea. autocpap 5-15 cm water.    Osteoarthritis    Parkinson disease ([place]) 03/08/14   2015 2017  Primidone - d/c 2019 Parkinson's: [facility] IR. Dr Tat   Postherpetic trigeminal neuralgia 01/26/2007   Qualifier: Diagnosis of  By: Marca Ancona RMA, [name]     Shortness of breath dyspnea    with allergies   Thrombocytosis 05/24/2017   Urinary incontinence 01/26/2007   Chronic  10/17 [place]    Vitamin D deficiency 01/26/2019   Past Surgical History:  Procedure Laterality Date   ABDOMINAL HYSTERECTOMY     BREAST CYST  EXCISION Left 1980's   LUMBAR LAMINECTOMY/DECOMPRESSION MICRODISCECTOMY Bilateral 02/20/2014   Procedure: LUMBAR TWO THREE, LUMBAR THREE FOUR LUMBAR LAMINECTOMY/DECOMPRESSION MICRODISCECTOMY 2 LEVELS;  Surgeon: Eustace Moore, MD;  Location: [facility];  Service: Neurosurgery;  Laterality: Bilateral;   SPLENECTOMY     TIBIA FRACTURE SURGERY Right    TIBIA FRACTURE SURGERY Left    TOTAL KNEE ARTHROPLASTY Right 2013    Allergies  Allergies  Allergen Reactions   Ace Inhibitors     Patient doesn't recall reaction.  jkl   Benicar [Olmesartan]     It made her sick   Aspirin Other (See Comments)    bruising   Citalopram Hydrobromide Diarrhea    History of Present Illness     Mallory Klein has a PMH of Parkinson's, OSA, asthma, hypertension, and NSVT.  She was initially referred by Dr. Alain Marion for an evaluation of her syncope 10/15/2020.  She reported that she had chronic falls due to her Parkinson's disease.  She also noted 2 episodes where she had passed out.  Her first episode was in September 2022.  She did not remember the syncopal events.  She followed up with Dr. Gardiner Rhyme.  During that time she denied palpitations, chest discomfort, dyspnea, and lower extremity swelling.  She did note intermittent headaches that she attributed to vertigo.  She has a history of obstructive sleep apnea but does not use  CPAP and no longer had a machine.  She would does not smoke and reported a family history of heart disease.  She wore a cardiac event monitor for 14 days on 11/12/2020 which showed 1 episode of NSVT and which was 7 beats.  Her echocardiogram 9/22 showed an EF of 55-60%, moderate LVH, G1 DD, normal RV function, and no significant valvular abnormalities.  She had a cardiac MRI 01/02/2021 which showed hyper trabeculation of the LV apex which was not consistent with noncompaction cardiomyopathy due to her normal LVEF and no LGE.  She was last seen by Dr. Gardiner Rhyme 01/07/2021.  During that time  she continued to do well.  She denied  episodes of syncope.  She did report some lightheadedness with standing quickly.  She denied chest pain, dyspnea, palpitations .  She did note intermittent periods of lower extremity swelling.  She presents to the clinic today for follow-up evaluation and states she has noticed increased lower extremity swelling for the past 2 months.  We reviewed her fluid intake, sodium intake, and support stocking use.  She reports that she recently purchased a wedge type device to help her elevate her feet while she is sleeping.  She also has ordered some lower extremity support stockings which she plans to use.  We reviewed her medication and went over her diuretic dosing.  She reports that she has not had any further episodes of lightheadedness or dizziness since around December.  We reviewed her echocardiogram.  I will give her the [place] support stocking sheet, salty 6 diet sheet, have encouraged her to continue her fluid consumption of around 48 ounces daily and we will plan follow-up for 6 months.  She reports that she is contacted her sleep medicine/CPAP supplier and her device is still on backorder.  Today she denies chest pain, shortness of breath, lower extremity edema, fatigue, palpitations, melena, hematuria, hemoptysis, diaphoresis, weakness, presyncope, syncope, orthopnea, and PND.   Home Medications    Prior to Admission medications   Medication Sig Start Date End Date Taking? Authorizing Provider  buprenorphine (BUTRANS) 10 MCG/HR PTWK 1 patch once a week. 03/20/21   [provider]  buprenorphine (BUTRANS) 7.5 MCG/HR 1 patch once a week. 08/01/20   [provider]  celecoxib (CELEBREX) 200 MG capsule Take 1 capsule (200 mg total) by mouth 2 (two) times daily as needed for moderate pain. 03/27/20   Plotnikov, Evie Lacks, MD  cetirizine (ZYRTEC) 10 MG tablet TAKE 1 TABLET BY MOUTH EVERY DAY 10/17/20   Plotnikov, Evie Lacks, MD  Cholecalciferol 25  MCG (1000 UT) tablet Take by mouth.    [provider]  [employer] SENNA PLUS 8.6-50 MG tablet Take 1 tablet by mouth 2 (two) times daily. 09/26/20   [provider]  cyanocobalamin (,VITAMIN B-12,) 1000 MCG/ML injection INJECT 1 ML INTO THE SKIN EVERY 14 DAYS 08/01/20   Plotnikov, Evie Lacks, MD  diclofenac Sodium (VOLTAREN) 1 % GEL Apply 4 g topically daily as needed (pain). 04/25/13   [provider]  docusate sodium (COLACE) 100 MG capsule Take 100 mg by mouth at bedtime. 06/26/19   [provider]  furosemide (LASIX) 20 MG tablet Take 1-2 tablets (20-40 mg total) by mouth daily as needed. 03/14/21   Biagio Borg, MD  hydrochlorothiazide (MICROZIDE) 12.5 MG capsule Take 12.5 mg by mouth daily. 03/20/21   [provider]  hydrOXYzine (VISTARIL) 25 MG capsule Take 25 mg by mouth once. 09/05/20   [provider]  montelukast (SINGULAIR) 10 MG tablet TAKE 1 TABLET BY MOUTH  DAILY 12/03/20   Plotnikov, Evie Lacks, MD  OVER THE COUNTER MEDICATION Take 1 tablet by mouth at bedtime. Lions mane extract    [provider]  pantoprazole (PROTONIX) 40 MG tablet TAKE 1 TABLET BY MOUTH  DAILY 12/03/20   Plotnikov, Evie Lacks, MD  polyethylene glycol (MIRALAX / GLYCOLAX) 17 g packet Take 17 g by mouth daily as needed for mild constipation.    [provider]  pramipexole (MIRAPEX) 0.75 MG tablet Take 1 tablet (0.75 mg total) by mouth 3 (three) times daily. 03/31/21   [name], [name], NP  promethazine-dextromethorphan (PROMETHAZINE-DM) 6.25-15 MG/5ML syrup Take 5 mLs by mouth 4 (four) times daily as needed for cough. 03/03/21   Plotnikov, Evie Lacks, MD  rosuvastatin (CRESTOR) 10 MG tablet Take 1 tablet (10 mg total) by mouth daily. 01/10/21 01/05/22  Donato Heinz, MD  verapamil (CALAN-SR) 180 MG CR tablet Take 1 tablet (180 mg total) by mouth at bedtime. 03/14/21   Biagio Borg, MD  zolpidem (AMBIEN) 10 MG tablet Take 0.5-1 tablets (5-10 mg total) by mouth at  bedtime as needed for sleep. OK to repeat 5-10 mg in 4 hrs after the first dose if needed 04/02/21   Plotnikov, Evie Lacks, MD  donepezil (ARICEPT) 5 MG tablet Take 1 tablet (5 mg total) by mouth at bedtime. Patient not taking: No sig reported 11/27/19 07/23/20  Plotnikov, Evie Lacks, MD  venlafaxine XR (EFFEXOR-XR) 150 MG 24 hr capsule TAKE 1 CAPSULE BY MOUTH  EVERY DAY WITH BREAKFAST Patient not taking: No sig reported 05/29/19 07/23/20  Plotnikov, Evie Lacks, MD    Family History    Family History  Problem Relation Age of Onset   Hypertension Mother    Diabetes type II Mother    Asthma Mother    Diabetes Mother    Dementia Mother    Prostate cancer Father    Mental illness Father        dementia   Suicidality Son    Stroke Brother        died in prison   Colon cancer Neg Hx    Rectal cancer Neg Hx    She indicated that her mother is deceased. She indicated that her father is deceased. She indicated that her brother is alive. She indicated that her daughter is alive. She indicated that her son is deceased. She indicated that the status of her neg hx is unknown.  Social History    Social History   Socioeconomic History   Marital status: Married    Spouse name: Not on file   Number of children: 2   Years of education: 14   Highest education level: Associate degree: occupational, Hotel manager, or vocational program  Occupational History   Occupation: Retired    Comment: Network engineer, Youth worker   Tobacco Use   Smoking status: Never   Smokeless tobacco: Never  Vaping Use   Vaping Use: Never used  Substance and Sexual Activity   Alcohol use: Yes    Alcohol/week: 2.0 standard drinks    Types: 2 Glasses of wine per week    Comment: wine on occ    Drug use: No   Sexual activity: Not Currently  Other Topics Concern   Not on file  Social History Narrative   Drink tea and coffee on occ   Right handed   1 story    Lives with spouse   Social Determinants of  Health   Financial  Resource Strain: Not on file  Food Insecurity: Not on file  Transportation Needs: Not on file  Physical Activity: Not on file  Stress: Not on file  Social Connections: Not on file  Intimate Partner Violence: Not on file     Review of Systems    General:  No chills, fever, night sweats or weight changes.  Cardiovascular:  No chest pain, dyspnea on exertion, edema, orthopnea, palpitations, paroxysmal nocturnal dyspnea. Dermatological: No rash, lesions/masses Respiratory: No cough, dyspnea Urologic: No hematuria, dysuria Abdominal:   No nausea, vomiting, diarrhea, bright red blood per rectum, melena, or hematemesis Neurologic:  No visual changes, wkns, changes in mental status. All other systems reviewed and are otherwise negative except as noted above.  Physical Exam    VS:  BP 114/66 (BP Location: Left Arm, Patient Position: Sitting, Cuff Size: Normal)    Pulse 94    Ht 5' 1.5" (1.562 m)    Wt 168 lb 9.6 oz (76.5 kg)    SpO2 99%    BMI 31.34 kg/m  , BMI Body mass index is 31.34 kg/m. GEN: Well nourished, well developed, in no acute distress. HEENT: normal. Neck: Supple, no JVD, carotid bruits, or masses. Cardiac: RRR, no murmurs, rubs, or gallops. No clubbing, cyanosis, bilateral lower extremity nonpitting edema.  Radials/DP/PT 2+ and equal bilaterally.  Respiratory:  Respirations regular and unlabored, clear to auscultation bilaterally. GI: Soft, nontender, nondistended, BS + x 4. MS: no deformity or atrophy. Skin: warm and dry, no rash. Neuro:  Strength and sensation are intact. Psych: Normal affect.  Accessory Clinical Findings    Recent Labs: 06/25/2020: ALT 2; TSH 0.67 02/21/2021: BUN 16; Creatinine, Ser 0.94; Hemoglobin 12.4; Platelets 289; Potassium 3.7; Sodium 141   Recent Lipid Panel    Component Value Date/Time   CHOL 200 (H) 01/07/2021 1451   TRIG 53 01/07/2021 1451   HDL 79 01/07/2021 1451   CHOLHDL 2.5 01/07/2021 1451   CHOLHDL 3 02/05/2015 1434   VLDL 13.8  02/05/2015 1434   LDLCALC 111 (H) 01/07/2021 1451   LDLDIRECT 109.8 02/10/2010 1031    ECG personally reviewed by me today-normal sinus rhythm possible left atrial enlargement left anterior fascicular block LVH, 94 bpm- No acute changes  Echocardiogram 11/21/2018  IMPRESSIONS     1. Prominent apical trabeculae; suggest cardiac MRI to R/O noncompaction.   2. Left ventricular ejection fraction, by estimation, is 55 to 60%. The  left ventricle has normal function. The left ventricle has no regional  wall motion abnormalities. There is moderate left ventricular hypertrophy.  Left ventricular diastolic  parameters are consistent with Grade I diastolic dysfunction (impaired  relaxation).   3. Right ventricular systolic function is normal. The right ventricular  size is normal.   4. The mitral valve is normal in structure. Trivial mitral valve  regurgitation. No evidence of mitral stenosis.   5. The aortic valve is tricuspid. Aortic valve regurgitation is trivial.  No aortic stenosis is present.   6. The inferior vena cava is normal in size with greater than 50%  respiratory variability, suggesting right atrial pressure of 3 mmHg.  Assessment & Plan   1. Essential hypertension-BP today 114/66.  Well-controlled at home. [place]ct[place] Heart healthy low-sodium diet-salty 6 given Increase physical activity as tolerated  Syncope-no recent episodes of lightheadedness presyncope or syncope.  More cardiac event monitor 9/22 which showed 1 episode of NSVT that lasted for 7 beats.  She did not have any syncopal episodes  while wearing her cardiac event monitor.  Echocardiogram showed normal LVEF, G1 DD, normal RV function, no significant valvular abnormalities.  Cardiac MRI 11/22 showed hyper trabeculation and no LGE.  Discussed possibly using implantable loop recorder if episodes persisted. Maintain p.o. hydration Heart healthy low-sodium diet Increase physical activity as  tolerated  Hyperlipidemia-01/07/2021: Cholesterol, Total 200; HDL 79; LDL Chol Calc (NIH) 111; Triglycerides 53 Continue rosuvastatin Heart healthy low-sodium high-fiber diet Increase physical activity as tolerated  OSA-diagnosed obstructive sleep apnea from sleep study 12/10/2020. [place] on backorder   Disposition: Follow-up with Dr. Gardiner Rhyme in 4-6 months.  Jossie Ng. Othmar Ringer NP-C    04/09/2021, 2:33 PM [address] Office [phone] Fax [phone]  Notice: This dictation was prepared with Dragon dictation along with smaller phrase technology. Any transcriptional errors that result from this process are unintentional and may not be corrected upon review.  I spent 13 minutes examining this patient, reviewing medications, and using patient centered shared decision making involving her cardiac care.  Prior to her visit I spent greater than 20 minutes reviewing her past medical history,  medications, and prior cardiac tests.

## 2021-04-07 NOTE — Telephone Encounter (Signed)
Patient called in to ask when she would be getting her CPAP machine. She was placed on hold wile I called Ivin Booty with [facility] to see where she is with her orders. Upon returning to the phone the patient was informed per Ivin Booty that she is currently filling October orders. Patient was given the choice of getting a Airsense 10 machine, which there is no wait on or waiting for the Airsense 11 model. She chose to wait for the newer model. Contact information has been given to the patient to check for any future inquires concerning the wait.

## 2021-04-08 ENCOUNTER — Telehealth: Payer: Self-pay | Admitting: Internal Medicine

## 2021-04-08 NOTE — Telephone Encounter (Signed)
Pt states [facility] will not fill zolpidem (AMBIEN) 10 MG tablet due to an interaction w/ her age.   Pt states she was informed by [facility] an authorization for the refill was sent to the provider on 2-14   Pt states she has 4 doses left  and  requesting a c/b to discuss other rx options until zolpidem can be filled

## 2021-04-09 ENCOUNTER — Encounter: Payer: Self-pay | Admitting: General Practice

## 2021-04-09 ENCOUNTER — Other Ambulatory Visit: Payer: Self-pay

## 2021-04-09 ENCOUNTER — Ambulatory Visit (INDEPENDENT_AMBULATORY_CARE_PROVIDER_SITE_OTHER): Payer: Medicare Other | Admitting: General Practice

## 2021-04-09 VITALS — BP 114/66 | HR 94 | Ht 61.5 in | Wt 168.6 lb

## 2021-04-09 DIAGNOSIS — I10 Essential (primary) hypertension: Secondary | ICD-10-CM | POA: Diagnosis not present

## 2021-04-09 DIAGNOSIS — G4733 Obstructive sleep apnea (adult) (pediatric): Secondary | ICD-10-CM

## 2021-04-09 DIAGNOSIS — R55 Syncope and collapse: Secondary | ICD-10-CM

## 2021-04-09 DIAGNOSIS — E782 Mixed hyperlipidemia: Secondary | ICD-10-CM | POA: Diagnosis not present

## 2021-04-09 NOTE — Telephone Encounter (Signed)
Rep w/ Rx drug plan states they are unable to cover cost of zolpidem (AMBIEN) 10 MG tablet due to dosage instructions exceeding the recommended 10 mgs daily  Rep states PA is required   Phone [phone]  Fax [phone]   Ref # [id number]

## 2021-04-09 NOTE — Patient Instructions (Addendum)
Medication Instructions:  The current medical regimen is effective;  continue present plan and medications as directed. Please refer to the Current Medication list given to you today.   *If you need a refill on your cardiac medications before your next appointment, please call your pharmacy*  Lab Work:   Testing/Procedures:  NONE    NONE  Special Instructions PLEASE READ AND FOLLOW SALTY 6-ATTACHED-1,800mg  daily  PLEASE INCREASE PHYSICAL ACTIVITY AS TOLERATED   PLEASE PURCHASE AND WEAR COMPRESSION STOCKINGS DAILY AND TAKE OFF AT BEDTIME. Compression stockings are elastic socks that squeeze the legs. They help to increase blood flow to the legs and to decrease swelling in the legs from fluid retention, and reduce the chance of developing blood clots in the lower legs. Please put on in the AM when dressing and off at night when dressing for bed.  LET THEM KNOW THAT YOU NEED KNEE HIGH'S WITH COMPRESSION OF 15-20 mmhg.  [employer]  [place] ([place] [phone]); [place], [place] [address]; [phone]  EMAIL   [email or web address].  PLEASE MAKE SURE TO ELEVATE YOUR FEET & LEGS WHILE SITTING, THIS WILL HELP WITH THE SWELLING ALSO.   Follow-Up: Your next appointment:  6 month(s) In Person with Oswaldo Milian, MD   Please call our office 2 months in advance to schedule this appointment  :1  At [facility], you and your health needs are our priority.  As part of our continuing mission to provide you with exceptional heart care, we have created designated Provider Care Teams.  These Care Teams include your primary Cardiologist (physician) and Advanced Practice Providers (APPs -  Physician Assistants and Nurse Practitioners) who all work together to provide you with the care you need, when you need it.            6 SALTY THINGS TO AVOID     1,800MG  DAILY

## 2021-04-10 ENCOUNTER — Telehealth: Payer: Self-pay | Admitting: Internal Medicine

## 2021-04-10 MED ORDER — TEMAZEPAM 15 MG PO CAPS: ORAL_CAPSULE | ORAL | 3 refills | Status: DC

## 2021-04-10 NOTE — Telephone Encounter (Signed)
Would need OV for this as PCP not here

## 2021-04-10 NOTE — Telephone Encounter (Signed)
Okay to discontinue zolpidem.  Start temazepam-prescription emailed.  Thanks

## 2021-04-10 NOTE — Addendum Note (Signed)
Addended by: Orma Render on: 04/10/2021 08:26 AM   Modules accepted: Orders

## 2021-04-10 NOTE — Telephone Encounter (Signed)
*  see below*

## 2021-04-10 NOTE — Telephone Encounter (Signed)
Pt requesting an alternative sleep aid rx due to zolpidem (AMBIEN) 10 MG tablet needing a PA  Pt requesting a c/b to discuss options

## 2021-04-11 ENCOUNTER — Ambulatory Visit: Payer: Medicare Other

## 2021-04-11 NOTE — Telephone Encounter (Addendum)
PA has been started  Key: Key: [id number]   This medication or product is on your plan's list of covered drugs. Prior authorization is not required at this time. If your pharmacy has questions regarding the processing of your prescription, please have them call the [facility] pharmacy help desk at ([phone].

## 2021-04-11 NOTE — Telephone Encounter (Signed)
Spoke with patient in regards to AWV.  While on the phone patient stated that she has been trying to get someone to help her all week with her Zolpidem medication. Can someone please call patient today in regards to status? Patient states she will be out today, and with it being the weekend she would like to know status today.

## 2021-04-11 NOTE — Telephone Encounter (Signed)
Patient notified

## 2021-04-11 NOTE — Telephone Encounter (Signed)
Pt is requesting that a rx for  zolpidem (AMBIEN) 10 MG tablet  be sent in to her pharmacy.

## 2021-04-14 NOTE — Telephone Encounter (Signed)
Pt states pharmacy advise her that the new medication temazepam (RESTORIL) 15 MG capsule needs PA.  Please advise Pt with update when the PA is complete.  Pt CB [phone]

## 2021-04-16 NOTE — Telephone Encounter (Signed)
We gave Mallory Klein temazepam instead at her office visit.  Zolpidem was discontinued.  Thank you

## 2021-04-16 NOTE — Telephone Encounter (Signed)
PA has been started.  Key: [id number]

## 2021-04-17 DIAGNOSIS — M47816 Spondylosis without myelopathy or radiculopathy, lumbar region: Secondary | ICD-10-CM | POA: Diagnosis not present

## 2021-04-17 DIAGNOSIS — G894 Chronic pain syndrome: Secondary | ICD-10-CM | POA: Diagnosis not present

## 2021-04-17 DIAGNOSIS — M48061 Spinal stenosis, lumbar region without neurogenic claudication: Secondary | ICD-10-CM | POA: Diagnosis not present

## 2021-04-17 DIAGNOSIS — M1712 Unilateral primary osteoarthritis, left knee: Secondary | ICD-10-CM | POA: Diagnosis not present

## 2021-04-23 ENCOUNTER — Ambulatory Visit: Payer: Medicare Other

## 2021-04-24 ENCOUNTER — Ambulatory Visit: Payer: Medicare Other

## 2021-04-28 NOTE — Therapy (Signed)
OUTPATIENT PHYSICAL THERAPY THORACOLUMBAR EVALUATION   Patient Name: Mallory Klein MRN: [id number] DOB:October 04, 1945, 76 y.o., female Today's Date: 04/30/2021   PT End of Session - 04/29/21 1445     Visit Number 1    Number of Visits 17    Date for PT Re-Evaluation 06/28/21    Authorization Type [employer] MCR    Progress Note Due on Visit 10    PT Start Time 1638    PT Stop Time 1530    PT Time Calculation (min) 45 min    Equipment Utilized During Treatment Gait belt;Other (comment)   rollator   Activity Tolerance Patient tolerated treatment well;Patient limited by fatigue    Behavior During Therapy [facility] for tasks assessed/performed             Past Medical History:  Diagnosis Date   Abnormal CBC 02/05/2015   [name] 12/16 new: normocytic anemia and thrombocytosis   Adjustment disorder with mixed anxiety and depressed mood 02/15/2007   Chronic  Chronic pain Grief, stress Effexor XR  Dad died in 07-Mar-2023   Asthma    Diverticulosis of colon (without mention of hemorrhage)    Esophageal stricture    Essential hypertension 01/26/2007   Chronic Verapamil    Falls    "I blackout"   GERD (gastroesophageal reflux disease) 02/15/2007   Chronic     Heart murmur    Hyperglycemia 03/02/2011   Mild     Insomnia disorder 12/08/2018   10/20 Carbid/Lev dose was increased - c/o hard time falling asleep (6 am) getting up at 12 am, poor sleep. Try Temazepam 15-30 mg at 11-1:30 pm    Internal hemorrhoids without mention of complication    Iron deficiency anemia 04/23/2017   Mild neurocognitive disorder, likely due to Parkinson's disease 02/09/2019   OSA (obstructive sleep apnea) 02/14/2010   In lab study (May 2018): AHI 25. Only had 45 minutes of sleep secondary to frequent awakenings secondary to sleep apnea. autocpap 5-15 cm water.    Osteoarthritis    Parkinson disease ([place]) 03-06-14   2015 2017  Primidone - d/c 2019 Parkinson's: [facility] IR. Dr Tat   Postherpetic trigeminal neuralgia  01/26/2007   Qualifier: Diagnosis of  By: Marca Ancona RMA, [name]     Shortness of breath dyspnea    with allergies   Thrombocytosis 05/24/2017   Urinary incontinence 01/26/2007   Chronic  10/17 Detrol LA    Vitamin D deficiency 01/26/2019   Past Surgical History:  Procedure Laterality Date   ABDOMINAL HYSTERECTOMY     BREAST CYST EXCISION Left 1980's   LUMBAR LAMINECTOMY/DECOMPRESSION MICRODISCECTOMY Bilateral 02/20/2014   Procedure: LUMBAR TWO THREE, LUMBAR THREE FOUR LUMBAR LAMINECTOMY/DECOMPRESSION MICRODISCECTOMY 2 LEVELS;  Surgeon: Eustace Moore, MD;  Location: [place] [facility];  Service: Neurosurgery;  Laterality: Bilateral;   SPLENECTOMY     TIBIA FRACTURE SURGERY Right    TIBIA FRACTURE SURGERY Left    TOTAL KNEE ARTHROPLASTY Right 2013   Patient Active Problem List   Diagnosis Date Noted   Complete tear of right rotator cuff 08/22/2020   Syncope and collapse 08/06/2020   Shoulder contusion 07/31/2020   Fall 07/31/2020   Concussion 07/31/2020   Anosmia 05/02/2020   Grief reaction 03/27/2020   Memory loss 12/13/2019   Food poisoning 09/11/2019   B12 deficiency 06/14/2019   Mild neurocognitive disorder, likely due to Parkinson's disease 02/09/2019   Vitamin D deficiency 01/26/2019   Sweating profusely 12/08/2018   Insomnia disorder 12/08/2018   Cerumen impaction  10/19/2018   Lumbar facet arthropathy 02/10/2018   Thrombocytosis 05/24/2017   Iron deficiency anemia 04/23/2017   Cough 05/12/2016   Pseudophakia of both eyes 10/09/2015   Abnormal CBC 02/05/2015   Edema 10/15/2014   S/P lumbar laminectomy 02/20/2014   Pre-ulcerative calluses 02/09/2014   Parkinson disease ([place]) 02/09/2014   Cold sore 10/17/2013   Low back pain 07/25/2013   Nuclear sclerosis 02/08/2013   Heel ulcer ([place]) 03/08/2012   Rash 12/04/2011   Neck pain on left side 08/06/2011   Presence of unspecified artificial knee joint 04/22/2011   Hyperglycemia 03/02/2011   Acquired spondylolisthesis 12/25/2010    Degeneration of lumbosacral intervertebral disc 12/25/2010   Arthritis of right knee 12/03/2010   Knee pain, right 09/08/2010   OSA (obstructive sleep apnea) 02/14/2010   Upper respiratory infection 01/13/2010   BRONCHITIS, ACUTE 04/05/2009   Vertigo 12/02/2007   Other abnormal glucose 12/02/2007   Otitis media 10/26/2007   Other malaise and fatigue 05/11/2007   Adjustment disorder with mixed anxiety and depressed mood 02/15/2007   GERD (gastroesophageal reflux disease) 02/15/2007   Osteoarthritis 02/15/2007   Postherpetic trigeminal neuralgia 01/26/2007   Herpes zoster 01/26/2007   Morbid obesity 01/26/2007   Migraine headaches 01/26/2007   Essential hypertension 01/26/2007   Asthma 01/26/2007   Urinary incontinence 01/26/2007    PCP: Cassandria Anger, MD  REFERRING PROVIDER: Margaretha Sheffield   REFERRING DIAG:  lumbar spondylosis, stenosis  Lt knee OA Parkinson's disease    THERAPY DIAG:  Other abnormalities of gait and mobility  Chronic bilateral low back pain, unspecified whether sciatica present  Chronic pain of left knee  Chronic pain of right knee  Muscle weakness (generalized)  ONSET DATE: parkinson's disease diagnosed 5 years ago; chronic low back and bilateral knee pain   SUBJECTIVE:                                                                                                                                                                                           SUBJECTIVE STATEMENT: spouse present Patient reports about 5 years ago she was diagnosed with Parkinson's Disease. She is getting used to new medication that was prescribed for PD. She reports both knees "are bad" reporting having Rt TKA and needs a Lt TKA, but does not want to go through with it. She reports balance issues and has experienced episodes of "blacking out" and falling and has seen her physician for this complaint without remarkable findings. She reports chronic low back pain,  but this pain has recently made it difficult to perform transfers. She denies any numbness/tingling.   PERTINENT HISTORY:  Rt TKA  2013 Lumbar laminectomy 2015   PAIN:  Are you having pain? Yes NPRS scale: 8/10 Pain location: BLE, low back  PAIN TYPE: ache, dull  Pain description: constant  Aggravating factors: weather Relieving factors: medication   PRECAUTIONS: Fall  WEIGHT BEARING RESTRICTIONS No  FALLS:  Has patient fallen in last 6 months? Yes, Number of falls: 2; episodes of falling when "blacking out" when going to stand (per patient unknown cause for "blacking out.")   LIVING ENVIRONMENT: Lives with: lives with their spouse Lives in: House/apartment Stairs: No;  Has following equipment at home: Environmental consultant - 4 wheeled, Wheelchair (manual), and shower chair  OCCUPATION: retired   PLOF: Needs assistance with ADLs  PATIENT GOALS "stop hurting."    OBJECTIVE:   DIAGNOSTIC FINDINGS:  N/A  PATIENT SURVEYS:  FOTO incorrect setup  SCREENING FOR RED FLAGS: Bowel or bladder incontinence: No Cauda equina syndrome: No   COGNITION:  Overall cognitive status: Within functional limits for tasks assessed     SENSATION:  Light touch: not assessed   POSTURE:  Increased kyphosis, slumped posture in sitting   PALPATION: Not assessed   LUMBARAROM/PROM  A/PROM A/PROM  04/30/2021  Flexion   Extension   Right lateral flexion   Left lateral flexion   Right rotation   Left rotation    (Blank rows = not tested)  LE AROM/PROM:  A/PROM Right 04/30/2021 Left 04/30/2021  Hip flexion    Hip extension    Hip abduction    Hip adduction    Hip internal rotation    Hip external rotation    Knee flexion    Knee extension    Ankle dorsiflexion    Ankle plantarflexion    Ankle inversion    Ankle eversion     (Blank rows = not tested)  LE MMT:  MMT Right 04/30/2021 Left 04/30/2021  Hip flexion 3-/5 3-/5   Hip extension    Hip abduction    Hip adduction    Hip  internal rotation    Hip external rotation    Knee flexion 4+/5 4+/5   Knee extension 4+/5 4+/5  Ankle dorsiflexion 5/5 5/5  Ankle plantarflexion    Ankle inversion    Ankle eversion     (Blank rows = not tested)    FUNCTIONAL TESTS:  30 sec STS: 0  TUG: 63 seconds Sit to supine Mod A Supine to sit Mod I  Sit to stand Mod A Stand to Sit CGA  BERG BALANCE  Sitting to Standing: Numbers; 0-4: 0  4. Stands without using hands and stabilize independently  3. Stands independently using hands  2. Stands using hands after multiple trials  1. Min A to stand  0. Mod-Max A to stand Standing unsupported: Numbers; 0-4: 2  4. Stands safely for 2 minutes  3. Stands 2 minutes with supervision  2. Stands 30 seconds unsupported  1. Needs several tries to stand unsupported for 30 seconds  0. Unable to stand unsupported for 30 seconds Sitting unsupported: Numbers; 0-4: 4  4. Sits for 2 minutes independently  3. Sits for 2 minutes with supervision  2. Able to sit 30 seconds  1. Able to sit 10 seconds  0. Unable to sit for 10 seconds Standing to Sitting: Numbers; 0-4: 2 4. Sits safely with minimal use of hands 3. Controls descent with hands 2. Uses back of legs against chair to control descent 1. Sits independently, but uncontrolled descent 0. Needs assistance Transfers: Numbers; 0-4: 1  4.  Transfers safely with minor use of hands  3. Transfers safely definite use of hands  2. Transfers with verbal cueing/supervision  1. Needs 1 person assist  0. Needs 2 person assist  Standing with eyes closed: Numbers; 0-4: 3  4. Stands safely for 10 seconds  3. Stands 10 seconds with supervision   2. Able to stand for 3 seconds  1. Unable to keep eyes closed for 3 seconds, but is safe  0. Needs assist to keep from falling Standing with feet together: Numbers; 0-4: 0 4. Stands for 1 minute safely 3. Stands for 1 minute with supervision 2. Unable to hold for 30 seconds  1. Needs help to  attain position but can hold for 15 seconds  0. Needs help to attain position and unable to hold for 15 seconds Reaching forward with outstretched arm: Numbers; 0-4: 1  4. Reaches forward 10 inches  3. Reaches forward 5 inches  2. Reaches forward 2 inches  1. Reaches forward with supervision  0. Loses balance/requires assistace Retrieving object from the floor: Numbers; 0-4: 0 4. Able to pick up easily and safely 3. Able to pick up with supervision 2. Unable to pick up, but reaches within 1-2 inches independently 1. Unable to pick up and needs supervision 0. Unable/needs assistance to keep from falling  Turning to look behind: Numbers; 0-4: 2  4. Looks behind from both sides and weight shifts well  3. Looks behind one side only, other side less weight shift  2. Turns sideways only, maintains balance  1. Needs supervision when turning  0. Needs assistance  Turning 360 degrees: Numbers; 0-4: 1  4. Able to turn in </=4 seconds  3. Able to turn on one side in </= 4 seconds   2. Able to turn slowly, but safely  1. Needs supervision or verbal cueing  0. Needs assistance Place alternate foot on stool: Numbers; 0-4: 0 4. Completes 8 steps in 20 seconds 3. Completes 8 steps in >20 seconds 2. 4 steps without assistance/supervision 1. Completes >2 steps with minimal assist 0. Unable, needs assist to keep from falling Standing with one foot in front: Numbers; 0-4: 0  4. Independent tandem for 30 seconds  3. Independent foot ahead for 30 seconds  2. Independent small step for 30 seconds  1. Needs help to step, but can hold for 15 seconds  0. Loses balance while standing/stepping Standing on one foot: Numbers; 0-4: 0 4. Holds >10 seconds 3. Holds 5-10 seconds 2. Holds >/=3 seconds  1. Holds <3 seconds 0. Unable   Total Score: 16/56    GAIT: Distance walked: 20 ft  Assistive device utilized: Walker - 4 wheeled Level of assistance: SBA Comments: shuffling, forward flexed posture,  decreased step length     TODAY'S TREATMENT  [facility] Adult PT Treatment:                                                DATE: 04/29/21   Therapeutic Activity: Education on assessment findings that will be addressed throughout duration of POC.      PATIENT EDUCATION:  Education details: see treatment  Person educated: Patient; spouse  Education method: Explanation Education comprehension: verbalized understanding   HOME EXERCISE PROGRAM: No time at eval to issue   ASSESSMENT:  CLINICAL IMPRESSION: Patient is a 76  y.o. female who was  seen today for physical therapy evaluation and treatment for chronic LBP, bilateral knee pain, and parkinson's disease. Upon assessment she is noted to have BLE strength deficits, difficulty with transfers, gait abnormalities, and scores at an increased fall risk based upon her TUG and BERG. She will benefit from skilled PT to address the above stated deficits in order to optimize her function.    OBJECTIVE IMPAIRMENTS Abnormal gait, decreased activity tolerance, decreased balance, decreased endurance, decreased knowledge of use of DME, decreased mobility, difficulty walking, decreased strength, decreased safety awareness, improper body mechanics, postural dysfunction, and pain.   ACTIVITY LIMITATIONS cleaning, community activity, meal prep, laundry, yard work, and shopping.   PERSONAL FACTORS Age, Fitness, Time since onset of injury/illness/exacerbation, and 3+ comorbidities: see PMH above  are also affecting patient's functional outcome.    REHAB POTENTIAL: Fair    CLINICAL DECISION MAKING: Evolving/moderate complexity  EVALUATION COMPLEXITY: High   GOALS: Goals reviewed with patient? No  SHORT TERM GOALS:  Patient will be independent and compliant with initial HEP.   Baseline: no time at eval to issue  Target date: 05/21/2021 Goal status: INITIAL  2.  Patient will demonstrate proper hand positioning without cues when completing sit to  stand transfer to improve overall safety with transfers.  Baseline: requires consistent cues to place hands on chair during sit to stand as she has tendency to push off of rollator  Target date: 05/28/2021 Goal status: INITIAL  3.  Patient will be Mod I for sit to supine transfer to improve independence with bed mobility.  Baseline: see above Target date: 05/28/2021 Goal status: INITIAL   LONG TERM GOALS:  Patient will tolerate at least 5 minutes of continuous standing activity without an increase in back or knee pain to improve ability to complete ADLs.  Baseline: 30 seconds  Target date: 06/25/2021 Goal status: INITIAL  2.  Patient will be Mod I for sit to stand transfer without an increase in back pain.  Baseline: Mod A  Target date: 06/25/2021 Goal status: INITIAL  3.  Patient will complete TUG in <57 seconds to signify a reduction in fall risk.  Baseline: 63 seconds  Target date: 06/25/2021 Goal status: INITIAL  4.  Patient will demonstrate 3+/5 bilateral hip flexor strength to improve ability to step over objects.  Baseline: 3-/5 Target date: 06/25/2021 Goal status: INITIAL  5. Patient will be independent with advanced home program to assist in management of her chronic condition.    Baseline: no HEP issued   Target Date: 06/25/2021   Goal Status: Initial  PLAN: PT FREQUENCY: 1-2x/week  PT DURATION: 8 weeks  PLANNED INTERVENTIONS: Therapeutic exercises, Therapeutic activity, Neuromuscular re-education, Balance training, Gait training, Patient/Family education, Joint mobilization, DME instructions, Dry Needling, Cryotherapy, Moist heat, Taping, and Manual therapy  PLAN FOR NEXT SESSION: capture FOTO, issue HEP for LE strengthening, transfers, gait training   Gwendolyn Grant, PT, DPT, ATC 04/30/21 10:16 AM

## 2021-04-29 ENCOUNTER — Ambulatory Visit: Payer: Medicare Other | Attending: Internal Medicine

## 2021-04-29 ENCOUNTER — Other Ambulatory Visit: Payer: Self-pay

## 2021-04-29 DIAGNOSIS — M25562 Pain in left knee: Secondary | ICD-10-CM

## 2021-04-29 DIAGNOSIS — M6281 Muscle weakness (generalized): Secondary | ICD-10-CM

## 2021-04-29 DIAGNOSIS — M25561 Pain in right knee: Secondary | ICD-10-CM | POA: Diagnosis not present

## 2021-04-29 DIAGNOSIS — M545 Low back pain, unspecified: Secondary | ICD-10-CM

## 2021-04-29 DIAGNOSIS — G8929 Other chronic pain: Secondary | ICD-10-CM | POA: Diagnosis not present

## 2021-04-29 DIAGNOSIS — R2689 Other abnormalities of gait and mobility: Secondary | ICD-10-CM

## 2021-04-30 ENCOUNTER — Encounter: Payer: Self-pay | Admitting: Internal Medicine

## 2021-05-01 NOTE — Therapy (Signed)
OUTPATIENT PHYSICAL THERAPY TREATMENT NOTE   Patient Name: Mallory Klein MRN: [id number] DOB:1945/08/26, 76 y.o., female Today's Date: 05/02/2021  PCP: Cassandria Anger, MD REFERRING PROVIDER: Cassandria Anger, MD   PT End of Session - 05/02/21 1345     Visit Number 2    Number of Visits 17    Date for PT Re-Evaluation 06/28/21    Authorization Type UHC MCR    Progress Note Due on Visit 10    PT Start Time 2376    PT Stop Time 1430    PT Time Calculation (min) 45 min    Equipment Utilized During Treatment Gait belt    Activity Tolerance Patient tolerated treatment well;Patient limited by fatigue    Behavior During Therapy [facility] for tasks assessed/performed             Past Medical History:  Diagnosis Date   Abnormal CBC 02/05/2015   [name] 12/16 new: normocytic anemia and thrombocytosis   Adjustment disorder with mixed anxiety and depressed mood 02/15/2007   Chronic  Chronic pain Grief, stress Effexor XR  Dad died in 2023-03-12   Asthma    Diverticulosis of colon (without mention of hemorrhage)    Esophageal stricture    Essential hypertension 01/26/2007   Chronic Verapamil    Falls    "I blackout"   GERD (gastroesophageal reflux disease) 02/15/2007   Chronic     Heart murmur    Hyperglycemia 03/02/2011   Mild     Insomnia disorder 12/08/2018   10/20 Carbid/Lev dose was increased - c/o hard time falling asleep (6 am) getting up at 12 am, poor sleep. Try Temazepam 15-30 mg at 11-1:30 pm    Internal hemorrhoids without mention of complication    Iron deficiency anemia 04/23/2017   Mild neurocognitive disorder, likely due to Parkinson's disease 02/09/2019   OSA (obstructive sleep apnea) 02/14/2010   In lab study (May 2018): AHI 25. Only had 45 minutes of sleep secondary to frequent awakenings secondary to sleep apnea. autocpap 5-15 cm water.    Osteoarthritis    Parkinson disease ([place]) Mar 11, 2014   2015 2017  Primidone - d/c 2019 Parkinson's: [facility] IR. Dr  Tat   Postherpetic trigeminal neuralgia 01/26/2007   Qualifier: Diagnosis of  By: Marca Ancona RMA, [name]     Shortness of breath dyspnea    with allergies   Thrombocytosis 05/24/2017   Urinary incontinence 01/26/2007   Chronic  10/17 Detrol LA    Vitamin D deficiency 01/26/2019   Past Surgical History:  Procedure Laterality Date   ABDOMINAL HYSTERECTOMY     BREAST CYST EXCISION Left 1980's   LUMBAR LAMINECTOMY/DECOMPRESSION MICRODISCECTOMY Bilateral 02/20/2014   Procedure: LUMBAR TWO THREE, LUMBAR THREE FOUR LUMBAR LAMINECTOMY/DECOMPRESSION MICRODISCECTOMY 2 LEVELS;  Surgeon: Eustace Moore, MD;  Location: [place] [facility];  Service: Neurosurgery;  Laterality: Bilateral;   SPLENECTOMY     TIBIA FRACTURE SURGERY Right    TIBIA FRACTURE SURGERY Left    TOTAL KNEE ARTHROPLASTY Right 2013   Patient Active Problem List   Diagnosis Date Noted   Complete tear of right rotator cuff 08/22/2020   Syncope and collapse 08/06/2020   Shoulder contusion 07/31/2020   Fall 07/31/2020   Concussion 07/31/2020   Anosmia 05/02/2020   Grief reaction 03/27/2020   Memory loss 12/13/2019   Food poisoning 09/11/2019   B12 deficiency 06/14/2019   Mild neurocognitive disorder, likely due to Parkinson's disease 02/09/2019   Vitamin D deficiency 01/26/2019   Sweating profusely 12/08/2018  Insomnia disorder 12/08/2018   Cerumen impaction 10/19/2018   Lumbar facet arthropathy 02/10/2018   Thrombocytosis 05/24/2017   Iron deficiency anemia 04/23/2017   Cough 05/12/2016   Pseudophakia of both eyes 10/09/2015   Abnormal CBC 02/05/2015   Edema 10/15/2014   S/P lumbar laminectomy 02/20/2014   Pre-ulcerative calluses 02/09/2014   Parkinson disease ([place]) 02/09/2014   Cold sore 10/17/2013   Low back pain 07/25/2013   Nuclear sclerosis 02/08/2013   Heel ulcer ([place]) 03/08/2012   Rash 12/04/2011   Neck pain on left side 08/06/2011   Presence of unspecified artificial knee joint 04/22/2011   Hyperglycemia 03/02/2011    Acquired spondylolisthesis 12/25/2010   Degeneration of lumbosacral intervertebral disc 12/25/2010   Arthritis of right knee 12/03/2010   Knee pain, right 09/08/2010   OSA (obstructive sleep apnea) 02/14/2010   Upper respiratory infection 01/13/2010   BRONCHITIS, ACUTE 04/05/2009   Vertigo 12/02/2007   Other abnormal glucose 12/02/2007   Otitis media 10/26/2007   Other malaise and fatigue 05/11/2007   Adjustment disorder with mixed anxiety and depressed mood 02/15/2007   GERD (gastroesophageal reflux disease) 02/15/2007   Osteoarthritis 02/15/2007   Postherpetic trigeminal neuralgia 01/26/2007   Herpes zoster 01/26/2007   Morbid obesity 01/26/2007   Migraine headaches 01/26/2007   Essential hypertension 01/26/2007   Asthma 01/26/2007   Urinary incontinence 01/26/2007    REFERRING PROVIDER: Margaretha Sheffield, MD    REFERRING DIAG:  Lumbar spondylosis, stenosis  Lt knee OA Parkinson's disease   THERAPY DIAG:  Other abnormalities of gait and mobility  Chronic bilateral low back pain, unspecified whether sciatica present  Chronic pain of left knee  Chronic pain of right knee  Muscle weakness (generalized)  PERTINENT HISTORY:  Rt TKA 2013 Lumbar laminectomy 2015   PRECAUTIONS: Fall  SUBJECTIVE: Patient reports she is doing just about the same. Her goal is to be able to turn in bed and get up by herself.  PAIN:  Are you having pain? Yes NPRS scale: 8/10 Pain location: bilat knees, low back  PAIN TYPE: ache, dull, sharp Pain description: constant  Aggravating factors: weather, standing, walking, transfers Relieving factors: medication   PATIENT GOALS: "stop hurting."    OBJECTIVE:  POSTURE:  Increased kyphosis, slumped posture in sitting    LE MMT:   MMT Right 04/30/2021 Left 04/30/2021  Hip flexion 3-/5 3-/5   Hip extension      Hip abduction      Hip adduction      Hip internal rotation      Hip external rotation      Knee flexion 4+/5 4+/5   Knee  extension 4+/5 4+/5  Ankle dorsiflexion 5/5 5/5  Ankle plantarflexion      Ankle inversion      Ankle eversion        FUNCTIONAL TESTS:  30 sec STS: 0  TUG: 63 seconds Sit to supine: Mod A Supine to sit: Mod I  Sit to stand: Mod A Stand to Sit: CGA  05/02/2021 - patient able to perform all transfers mod I with extra time required and frequent cueing for technique and effort   GAIT: Distance walked: 20 ft  Assistive device utilized: Walker - 4 wheeled Level of assistance: SBA Comments: shuffling, forward flexed posture, decreased step length        TODAY'S TREATMENT  [facility] Adult PT Treatment:  DATE: 05/02/2021 Therapeutic Exercise: NuStep L5 x 5 min with UE/LE while taking subjective Instructed and practice with log roll technique for sit to supine and supine to sit SLR 2 x 10 each - partial range Sidelying hip abduction 2 x 10 each - partial range Sit to stand x 5 - from elevated table with rollator in front, max cueing for forward trunk lean to unweight bottom to rise   [facility] Adult PT Treatment:                                               DATE: 04/29/21 Therapeutic Activity: Education on assessment findings that will be addressed throughout duration of POC.    PATIENT EDUCATION:  Education details: HEP update Person educated: Patient; spouse  Education method: Explanation, Handout Education comprehension: Verbalized understanding   HOME EXERCISE PROGRAM: Access Code: [email or web address]    ASSESSMENT: CLINICAL IMPRESSION: Patient tolerated therapy well with no adverse effects. Therapy focused on strengthening and improving patient's ability with transfers. She was able to perform all transfers at mod I level this visit requiring extra time. Updated her HEP to include some hip and LE strengthening. She continues to report bilateral knee and low back pain that seem to be limiting functional mobility. Patient would benefit from continued  skilled PT to progress her strength and mobility in order to reduce pain and maximize functional mobility with transfers and walking.     OBJECTIVE IMPAIRMENTS Abnormal gait, decreased activity tolerance, decreased balance, decreased endurance, decreased knowledge of use of DME, decreased mobility, difficulty walking, decreased strength, decreased safety awareness, improper body mechanics, postural dysfunction, and pain.    ACTIVITY LIMITATIONS cleaning, community activity, meal prep, laundry, yard work, and shopping.    PERSONAL FACTORS Age, Fitness, Time since onset of injury/illness/exacerbation, and 3+ comorbidities: see PMH above  are also affecting patient's functional outcome.      GOALS: SHORT TERM GOALS:   Patient will be independent and compliant with initial HEP.  Baseline: no time at eval to issue  Target date: 05/21/2021 Goal status: INITIAL   2.  Patient will demonstrate proper hand positioning without cues when completing sit to stand transfer to improve overall safety with transfers.  Baseline: requires consistent cues to place hands on chair during sit to stand as she has tendency to push off of rollator  Target date: 05/28/2021 Goal status: INITIAL   3.  Patient will be Mod I for sit to supine transfer to improve independence with bed mobility.  Baseline: see above Target date: 05/28/2021 Goal status: INITIAL     LONG TERM GOALS:   Patient will tolerate at least 5 minutes of continuous standing activity without an increase in back or knee pain to improve ability to complete ADLs.  Baseline: 30 seconds  Target date: 06/25/2021 Goal status: INITIAL   2.  Patient will be Mod I for sit to stand transfer without an increase in back pain.  Baseline: Mod A  Target date: 06/25/2021 Goal status: INITIAL   3.  Patient will complete TUG in <57 seconds to signify a reduction in fall risk.  Baseline: 63 seconds  Target date: 06/25/2021 Goal status: INITIAL   4.  Patient will  demonstrate 3+/5 bilateral hip flexor strength to improve ability to step over objects.  Baseline: 3-/5 Target date: 06/25/2021 Goal status: INITIAL   5. Patient will be  independent with advanced home program to assist in management of her chronic condition.                     Baseline: no HEP issued                    Target Date: 06/25/2021                    Goal Status: Initial    PLAN: PT FREQUENCY: 1-2x/week   PT DURATION: 8 weeks   PLANNED INTERVENTIONS: Therapeutic exercises, Therapeutic activity, Neuromuscular re-education, Balance training, Gait training, Patient/Family education, Joint mobilization, DME instructions, Dry Needling, Cryotherapy, Moist heat, Taping, and Manual therapy   PLAN FOR NEXT SESSION: progress transfer and gait training, strengthening   Hilda Blades, PT, DPT, LAT, ATC 05/02/21  2:39 PM Phone: [phone] Fax: [phone]

## 2021-05-02 ENCOUNTER — Ambulatory Visit: Payer: Medicare Other | Admitting: Physical Therapy

## 2021-05-02 ENCOUNTER — Other Ambulatory Visit: Payer: Self-pay

## 2021-05-02 ENCOUNTER — Encounter: Payer: Self-pay | Admitting: Physical Therapy

## 2021-05-02 DIAGNOSIS — M25561 Pain in right knee: Secondary | ICD-10-CM | POA: Diagnosis not present

## 2021-05-02 DIAGNOSIS — G8929 Other chronic pain: Secondary | ICD-10-CM

## 2021-05-02 DIAGNOSIS — R2689 Other abnormalities of gait and mobility: Secondary | ICD-10-CM

## 2021-05-02 DIAGNOSIS — M25562 Pain in left knee: Secondary | ICD-10-CM

## 2021-05-02 DIAGNOSIS — M6281 Muscle weakness (generalized): Secondary | ICD-10-CM | POA: Diagnosis not present

## 2021-05-02 DIAGNOSIS — M545 Low back pain, unspecified: Secondary | ICD-10-CM | POA: Diagnosis not present

## 2021-05-02 NOTE — Patient Instructions (Addendum)
Access Code: [email or web address] ?URL: [email or web address] ?Date: 05/02/2021 ?Prepared by: Hilda Blades ? ?Exercises ?Small Range Straight Leg Raise - 1 x daily - 2 sets - 10 reps ?Sidelying Hip Abduction - 1 x daily - 2 sets - 10 reps ?Seated Long Arc Quad - 1 x daily - 2 sets - 15 reps ?Sit to Stand with Counter Support - 1 x daily - 5 reps ? ? ?

## 2021-05-06 ENCOUNTER — Other Ambulatory Visit: Payer: Self-pay

## 2021-05-06 ENCOUNTER — Encounter: Payer: Self-pay | Admitting: Physical Therapy

## 2021-05-06 ENCOUNTER — Ambulatory Visit: Payer: Medicare Other | Admitting: Physical Therapy

## 2021-05-06 DIAGNOSIS — R2689 Other abnormalities of gait and mobility: Secondary | ICD-10-CM | POA: Diagnosis not present

## 2021-05-06 DIAGNOSIS — M6281 Muscle weakness (generalized): Secondary | ICD-10-CM | POA: Diagnosis not present

## 2021-05-06 DIAGNOSIS — M25561 Pain in right knee: Secondary | ICD-10-CM | POA: Diagnosis not present

## 2021-05-06 DIAGNOSIS — M545 Low back pain, unspecified: Secondary | ICD-10-CM

## 2021-05-06 DIAGNOSIS — G8929 Other chronic pain: Secondary | ICD-10-CM | POA: Diagnosis not present

## 2021-05-06 DIAGNOSIS — M25562 Pain in left knee: Secondary | ICD-10-CM | POA: Diagnosis not present

## 2021-05-06 NOTE — Therapy (Signed)
?OUTPATIENT PHYSICAL THERAPY TREATMENT NOTE ? ? ?Patient Name: Mallory Klein ?MRN: [id number] ?DOB:09-02-1945, 76 y.o., female ?Today's Date: 05/06/2021 ? ?PCP: Plotnikov, Evie Lacks, MD ?REFERRING PROVIDER: Cassandria Anger, MD ? ? PT End of Session - 05/06/21 1328   ? ? Visit Number 3   ? Number of Visits 17   ? Date for PT Re-Evaluation 06/28/21   ? Authorization Type UHC MCR   ? Progress Note Due on Visit 10   ? PT Start Time 1325   ? PT Stop Time 4403   ? PT Time Calculation (min) 38 min   ? ?  ?  ? ?  ? ? ?Past Medical History:  ?Diagnosis Date  ? Abnormal CBC 02/05/2015  ? [name] 12/16 new: normocytic anemia and thrombocytosis  ? Adjustment disorder with mixed anxiety and depressed mood 02/15/2007  ? Chronic  Chronic pain Grief, stress Effexor XR  Dad died in 03-06-2023  ? Asthma   ? Diverticulosis of colon (without mention of hemorrhage)   ? Esophageal stricture   ? Essential hypertension 01/26/2007  ? Chronic Verapamil   ? Falls   ? "I blackout"  ? GERD (gastroesophageal reflux disease) 02/15/2007  ? Chronic    ? Heart murmur   ? Hyperglycemia 03/02/2011  ? Mild    ? Insomnia disorder 12/08/2018  ? 10/20 Carbid/Lev dose was increased - c/o hard time falling asleep (6 am) getting up at 12 am, poor sleep. Try Temazepam 15-30 mg at 11-1:30 pm   ? Internal hemorrhoids without mention of complication   ? Iron deficiency anemia 04/23/2017  ? Mild neurocognitive disorder, likely due to Parkinson's disease 02/09/2019  ? OSA (obstructive sleep apnea) 02/14/2010  ? In lab study (May 2018): AHI 25. Only had 45 minutes of sleep secondary to frequent awakenings secondary to sleep apnea. autocpap 5-15 cm water.   ? Osteoarthritis   ? Parkinson disease ([place]) 2014-03-05  ? 2015 2017  Primidone - d/c 2019 Parkinson's: [facility] IR. Dr Tat  ? Postherpetic trigeminal neuralgia 01/26/2007  ? Qualifier: Diagnosis of  By: Reatha Armour, [name]    ? Shortness of breath dyspnea   ? with allergies  ? Thrombocytosis 05/24/2017  ? Urinary  incontinence 01/26/2007  ? Chronic  10/17 Detrol LA   ? Vitamin D deficiency 01/26/2019  ? ?Past Surgical History:  ?Procedure Laterality Date  ? ABDOMINAL HYSTERECTOMY    ? BREAST CYST EXCISION Left 1980's  ? LUMBAR LAMINECTOMY/DECOMPRESSION MICRODISCECTOMY Bilateral 02/20/2014  ? Procedure: LUMBAR TWO THREE, LUMBAR THREE FOUR LUMBAR LAMINECTOMY/DECOMPRESSION MICRODISCECTOMY 2 LEVELS;  Surgeon: Eustace Moore, MD;  Location: [place] [facility];  Service: Neurosurgery;  Laterality: Bilateral;  ? SPLENECTOMY    ? TIBIA FRACTURE SURGERY Right   ? TIBIA FRACTURE SURGERY Left   ? TOTAL KNEE ARTHROPLASTY Right 2013  ? ?Patient Active Problem List  ? Diagnosis Date Noted  ? Complete tear of right rotator cuff 08/22/2020  ? Syncope and collapse 08/06/2020  ? Shoulder contusion 07/31/2020  ? Fall 07/31/2020  ? Concussion 07/31/2020  ? Anosmia 05/02/2020  ? Grief reaction 03/27/2020  ? Memory loss 12/13/2019  ? Food poisoning 09/11/2019  ? B12 deficiency 06/14/2019  ? Mild neurocognitive disorder, likely due to Parkinson's disease 02/09/2019  ? Vitamin D deficiency 01/26/2019  ? Sweating profusely 12/08/2018  ? Insomnia disorder 12/08/2018  ? Cerumen impaction 10/19/2018  ? Lumbar facet arthropathy 02/10/2018  ? Thrombocytosis 05/24/2017  ? Iron deficiency anemia 04/23/2017  ? Cough 05/12/2016  ?  Pseudophakia of both eyes 10/09/2015  ? Abnormal CBC 02/05/2015  ? Edema 10/15/2014  ? S/P lumbar laminectomy 02/20/2014  ? Pre-ulcerative calluses 02/09/2014  ? Parkinson disease ([place]) 02/09/2014  ? Cold sore 10/17/2013  ? Low back pain 07/25/2013  ? Nuclear sclerosis 02/08/2013  ? Heel ulcer ([place]) 03/08/2012  ? Rash 12/04/2011  ? Neck pain on left side 08/06/2011  ? Presence of unspecified artificial knee joint 04/22/2011  ? Hyperglycemia 03/02/2011  ? Acquired spondylolisthesis 12/25/2010  ? Degeneration of lumbosacral intervertebral disc 12/25/2010  ? Arthritis of right knee 12/03/2010  ? Knee pain, right 09/08/2010  ? OSA (obstructive  sleep apnea) 02/14/2010  ? Upper respiratory infection 01/13/2010  ? BRONCHITIS, ACUTE 04/05/2009  ? Vertigo 12/02/2007  ? Other abnormal glucose 12/02/2007  ? Otitis media 10/26/2007  ? Other malaise and fatigue 05/11/2007  ? Adjustment disorder with mixed anxiety and depressed mood 02/15/2007  ? GERD (gastroesophageal reflux disease) 02/15/2007  ? Osteoarthritis 02/15/2007  ? Postherpetic trigeminal neuralgia 01/26/2007  ? Herpes zoster 01/26/2007  ? Morbid obesity 01/26/2007  ? Migraine headaches 01/26/2007  ? Essential hypertension 01/26/2007  ? Asthma 01/26/2007  ? Urinary incontinence 01/26/2007  ? ? ?REFERRING PROVIDER: Margaretha Sheffield, MD  ?  ?REFERRING DIAG:  ?Lumbar spondylosis, stenosis  ?Lt knee OA ?Parkinson's disease  ? ?THERAPY DIAG:  ?Other abnormalities of gait and mobility ? ?Chronic pain of left knee ? ?Chronic bilateral low back pain, unspecified whether sciatica present ? ?PERTINENT HISTORY:  ?Rt TKA 2013 ?Lumbar laminectomy 2015  ? ?PRECAUTIONS: Fall ? ?SUBJECTIVE: I did not not do the exercises because I was hurting so much over the weekend with the rain. I feel better now that the rain has stopped.  ? ?PAIN:  ?Are you having pain? Yes ?NPRS scale: 8/10 ?Pain location: bilat knees, low back  ?PAIN TYPE: ache, dull, sharp ?Pain description: constant  ?Aggravating factors: weather, standing, walking, transfers ?Relieving factors: medication  ? ?PATIENT GOALS: "stop hurting."  ? ? ?OBJECTIVE:  ?POSTURE:  ?Increased kyphosis, slumped posture in sitting  ?  ?LE MMT: ?  ?MMT Right ?04/30/2021 Left ?04/30/2021  ?Hip flexion 3-/5 3-/5   ?Hip extension      ?Hip abduction      ?Hip adduction      ?Hip internal rotation      ?Hip external rotation      ?Knee flexion 4+/5 4+/5   ?Knee extension 4+/5 4+/5  ?Ankle dorsiflexion 5/5 5/5  ?Ankle plantarflexion      ?Ankle inversion      ?Ankle eversion      ?  ?FUNCTIONAL TESTS:  ?30 sec STS: 0  ?TUG: 63 seconds ?Sit to supine: Mod A ?Supine to sit: Mod I  ?Sit  to stand: Mod A ?Stand to Sit: CGA ? ?05/02/2021 - patient able to perform all transfers mod I with extra time required and frequent cueing for technique and effort ?  ?GAIT: ?Distance walked: 20 ft  ?Assistive device utilized: Environmental consultant - 4 wheeled ?Level of assistance: SBA ?Comments: shuffling, forward flexed posture, decreased step length  ?  ?  ?  ?TODAY'S TREATMENT  ?[facility] Adult PT Treatment:                                                DATE: 05/06/2021 ?Therapeutic Exercise: ?10 minutes late ?Sit to stand x  5 - from elevated table with rollator in front, max cueing for forward trunk lean to unweight bottom to rise ?Seated LAQ and marching 10 x 2 each ?SLR 2 x 10 each - partial range (rest breaks)  ?SAQ x 20 - legs on high bolster ?Small bridge- legs on tall bolster ?Sidelying hip abduction 2 x 10 each - partial range  ?Left clam x 10  ?Self Care: discussed potential removeable handrail to add under mattress for safety with transitions and bed mobility.  ? ? ?[facility] Adult PT Treatment:                                                DATE: 05/02/2021 ?Therapeutic Exercise: ?NuStep L5 x 5 min with UE/LE while taking subjective ?Instructed and practice with log roll technique for sit to supine and supine to sit ?SLR 2 x 10 each - partial range ?Sidelying hip abduction 2 x 10 each - partial range ?Sit to stand x 5 - from elevated table with rollator in front, max cueing for forward trunk lean to unweight bottom to rise ? ? ?[facility] Adult PT Treatment:                                               DATE: 04/29/21 ?Therapeutic Activity: ?Education on assessment findings that will be addressed throughout duration of POC.  ?  ?PATIENT EDUCATION:  ?Education details: HEP update ?Person educated: Patient; spouse  ?Education method: Explanation, Handout ?Education comprehension: Verbalized understanding ?  ?HOME EXERCISE PROGRAM: ?Access Code: [email or web address] ?URL: [email or web address] ?Date: 05/06/2021 ?Prepared by: Hessie Diener ? ?Exercises ?Small Range Straight Leg Raise - 1 x daily - 2 sets - 10 reps ?Sidelying Hip Abduction - 1 x daily - 2 sets - 10 reps ?Seated Long Arc Quad - 1 x daily - 2 sets - 15 reps ?Sit to [facility]

## 2021-05-07 NOTE — Therapy (Signed)
?OUTPATIENT PHYSICAL THERAPY TREATMENT NOTE ? ? ?Patient Name: Mallory Klein ?MRN: [id number] ?DOB:06-May-1945, 76 y.o., female ?Today's Date: 05/08/2021 ? ?PCP: Plotnikov, Evie Lacks, MD ?REFERRING PROVIDER: Cassandria Anger, MD ? ? PT End of Session - 05/08/21 1449   ? ? Visit Number 4   ? Number of Visits 17   ? Date for PT Re-Evaluation 06/28/21   ? Authorization Type UHC MCR   ? Progress Note Due on Visit 10   ? PT Start Time 1448   ? PT Stop Time 1530   ? PT Time Calculation (min) 42 min   ? Activity Tolerance Patient tolerated treatment well   ? Behavior During Therapy [facility] for tasks assessed/performed   ? ?  ?  ? ?  ? ? ? ?Past Medical History:  ?Diagnosis Date  ? Abnormal CBC 02/05/2015  ? [name] 12/16 new: normocytic anemia and thrombocytosis  ? Adjustment disorder with mixed anxiety and depressed mood 02/15/2007  ? Chronic  Chronic pain Grief, stress Effexor XR  Dad died in 2023/02/28  ? Asthma   ? Diverticulosis of colon (without mention of hemorrhage)   ? Esophageal stricture   ? Essential hypertension 01/26/2007  ? Chronic Verapamil   ? Falls   ? "I blackout"  ? GERD (gastroesophageal reflux disease) 02/15/2007  ? Chronic    ? Heart murmur   ? Hyperglycemia 03/02/2011  ? Mild    ? Insomnia disorder 12/08/2018  ? 10/20 Carbid/Lev dose was increased - c/o hard time falling asleep (6 am) getting up at 12 am, poor sleep. Try Temazepam 15-30 mg at 11-1:30 pm   ? Internal hemorrhoids without mention of complication   ? Iron deficiency anemia 04/23/2017  ? Mild neurocognitive disorder, likely due to Parkinson's disease 02/09/2019  ? OSA (obstructive sleep apnea) 02/14/2010  ? In lab study (May 2018): AHI 25. Only had 45 minutes of sleep secondary to frequent awakenings secondary to sleep apnea. autocpap 5-15 cm water.   ? Osteoarthritis   ? Parkinson disease ([place]) 02/27/14  ? 2015 2017  Primidone - d/c 2019 Parkinson's: Sinmet IR. Dr Tat  ? Postherpetic trigeminal neuralgia 01/26/2007  ? Qualifier:  Diagnosis of  By: Reatha Armour, [name]    ? Shortness of breath dyspnea   ? with allergies  ? Thrombocytosis 05/24/2017  ? Urinary incontinence 01/26/2007  ? Chronic  10/17 Detrol LA   ? Vitamin D deficiency 01/26/2019  ? ?Past Surgical History:  ?Procedure Laterality Date  ? ABDOMINAL HYSTERECTOMY    ? BREAST CYST EXCISION Left 1980's  ? LUMBAR LAMINECTOMY/DECOMPRESSION MICRODISCECTOMY Bilateral 02/20/2014  ? Procedure: LUMBAR TWO THREE, LUMBAR THREE FOUR LUMBAR LAMINECTOMY/DECOMPRESSION MICRODISCECTOMY 2 LEVELS;  Surgeon: Eustace Moore, MD;  Location: [place] [facility];  Service: Neurosurgery;  Laterality: Bilateral;  ? SPLENECTOMY    ? TIBIA FRACTURE SURGERY Right   ? TIBIA FRACTURE SURGERY Left   ? TOTAL KNEE ARTHROPLASTY Right 2013  ? ?Patient Active Problem List  ? Diagnosis Date Noted  ? Complete tear of right rotator cuff 08/22/2020  ? Syncope and collapse 08/06/2020  ? Shoulder contusion 07/31/2020  ? Fall 07/31/2020  ? Concussion 07/31/2020  ? Anosmia 05/02/2020  ? Grief reaction 03/27/2020  ? Memory loss 12/13/2019  ? Food poisoning 09/11/2019  ? B12 deficiency 06/14/2019  ? Mild neurocognitive disorder, likely due to Parkinson's disease 02/09/2019  ? Vitamin D deficiency 01/26/2019  ? Sweating profusely 12/08/2018  ? Insomnia disorder 12/08/2018  ? Cerumen impaction 10/19/2018  ?  Lumbar facet arthropathy 02/10/2018  ? Thrombocytosis 05/24/2017  ? Iron deficiency anemia 04/23/2017  ? Cough 05/12/2016  ? Pseudophakia of both eyes 10/09/2015  ? Abnormal CBC 02/05/2015  ? Edema 10/15/2014  ? S/P lumbar laminectomy 02/20/2014  ? Pre-ulcerative calluses 02/09/2014  ? Parkinson disease ([place]) 02/09/2014  ? Cold sore 10/17/2013  ? Low back pain 07/25/2013  ? Nuclear sclerosis 02/08/2013  ? Heel ulcer ([place]) 03/08/2012  ? Rash 12/04/2011  ? Neck pain on left side 08/06/2011  ? Presence of unspecified artificial knee joint 04/22/2011  ? Hyperglycemia 03/02/2011  ? Acquired spondylolisthesis 12/25/2010  ? Degeneration of  lumbosacral intervertebral disc 12/25/2010  ? Arthritis of right knee 12/03/2010  ? Knee pain, right 09/08/2010  ? OSA (obstructive sleep apnea) 02/14/2010  ? Upper respiratory infection 01/13/2010  ? BRONCHITIS, ACUTE 04/05/2009  ? Vertigo 12/02/2007  ? Other abnormal glucose 12/02/2007  ? Otitis media 10/26/2007  ? Other malaise and fatigue 05/11/2007  ? Adjustment disorder with mixed anxiety and depressed mood 02/15/2007  ? GERD (gastroesophageal reflux disease) 02/15/2007  ? Osteoarthritis 02/15/2007  ? Postherpetic trigeminal neuralgia 01/26/2007  ? Herpes zoster 01/26/2007  ? Morbid obesity 01/26/2007  ? Migraine headaches 01/26/2007  ? Essential hypertension 01/26/2007  ? Asthma 01/26/2007  ? Urinary incontinence 01/26/2007  ? ? ?REFERRING PROVIDER: Margaretha Sheffield, MD  ?  ?REFERRING DIAG:  ?Lumbar spondylosis, stenosis  ?Lt knee OA ?Parkinson's disease  ? ?THERAPY DIAG:  ?Other abnormalities of gait and mobility ? ?Chronic pain of left knee ? ?Chronic bilateral low back pain, unspecified whether sciatica present ? ?Chronic pain of right knee ? ?Muscle weakness (generalized) ? ?PERTINENT HISTORY:  ?Rt TKA 2013 ?Lumbar laminectomy 2015  ? ?PRECAUTIONS: Fall ? ?SUBJECTIVE: Patient reports she is having pain today, her knees are aching more for some reason. ? ?PAIN:  ?Are you having pain? Yes ?NPRS scale: 8-9/10 ?Pain location: bilat knees, low back  ?PAIN TYPE: ache, dull, sharp ?Pain description: constant  ?Aggravating factors: weather, standing, walking, transfers ?Relieving factors: medication  ? ?PATIENT GOALS: "stop hurting"  ? ? ?OBJECTIVE:  ?POSTURE:  ?Increased kyphosis, slumped posture in sitting  ?  ?LE MMT: ?  ?MMT Right ?04/30/2021 Left ?04/30/2021  ?Hip flexion 3-/5 3-/5   ?Hip extension      ?Hip abduction      ?Hip adduction      ?Hip internal rotation      ?Hip external rotation      ?Knee flexion 4+/5 4+/5   ?Knee extension 4+/5 4+/5  ?Ankle dorsiflexion 5/5 5/5  ?Ankle plantarflexion      ?Ankle  inversion      ?Ankle eversion      ?  ?FUNCTIONAL TESTS:  ?30 sec STS: 0  ?TUG: 63 seconds ?Sit to supine: Mod A ?Supine to sit: Mod I  ?Sit to stand: Mod A ?Stand to Sit: CGA ? ?05/02/2021 - patient able to perform all transfers mod I with extra time required and frequent cueing for technique and effort ?  ?GAIT: ?Distance walked: 20 ft  ?Assistive device utilized: Environmental consultant - 4 wheeled ?Level of assistance: SBA ?Comments: shuffling, forward flexed posture, decreased step length  ?  ?  ?  ?TODAY'S TREATMENT  ?[facility] Adult PT Treatment:  DATE: 05/08/2021 ?Therapeutic Exercise: ?NuStep L5 x 5 min with UE/LE while taking subjective ?Sit to stand 2 x 5 - from elevated table with rollator in front, max cueing for forward trunk lean to unweight bottom to rise ?LAQ with 2# 2 x 10 each ?Seated alternating march with 2# 2 x 20 ?Seated row with green 2 x 10 ?Sidelying hip abduction 2 x 10 each - partial range ?SLR x 10 each ? ? ?[place] Adult PT Treatment:                                                DATE: 05/06/2021 ?Therapeutic Exercise: ?10 minutes late ?Sit to stand x 5 - from elevated table with rollator in front, max cueing for forward trunk lean to unweight bottom to rise ?Seated LAQ and marching 10 x 2 each ?SLR 2 x 10 each - partial range (rest breaks)  ?SAQ x 20 - legs on high bolster ?Small bridge- legs on tall bolster ?Sidelying hip abduction 2 x 10 each - partial range  ?Left clam x 10  ?Self Care: discussed potential removeable handrail to add under mattress for safety with transitions and bed mobility.  ? ?[place] Adult PT Treatment:                                                DATE: 05/02/2021 ?Therapeutic Exercise: ?NuStep L5 x 5 min with UE/LE while taking subjective ?Instructed and practice with log roll technique for sit to supine and supine to sit ?SLR 2 x 10 each - partial range ?Sidelying hip abduction 2 x 10 each - partial range ?Sit to stand x 5 - from elevated  table with rollator in front, max cueing for forward trunk lean to unweight bottom to rise ?  ?PATIENT EDUCATION:  ?Education details: HEP ?Person educated: Patient; spouse  ?Education method: Explanation ?Education c

## 2021-05-08 ENCOUNTER — Ambulatory Visit: Payer: Medicare Other | Admitting: Physical Therapy

## 2021-05-08 ENCOUNTER — Other Ambulatory Visit: Payer: Self-pay

## 2021-05-08 ENCOUNTER — Encounter: Payer: Self-pay | Admitting: Physical Therapy

## 2021-05-08 DIAGNOSIS — M545 Low back pain, unspecified: Secondary | ICD-10-CM | POA: Diagnosis not present

## 2021-05-08 DIAGNOSIS — G8929 Other chronic pain: Secondary | ICD-10-CM | POA: Diagnosis not present

## 2021-05-08 DIAGNOSIS — M25562 Pain in left knee: Secondary | ICD-10-CM | POA: Diagnosis not present

## 2021-05-08 DIAGNOSIS — R2689 Other abnormalities of gait and mobility: Secondary | ICD-10-CM | POA: Diagnosis not present

## 2021-05-08 DIAGNOSIS — M25561 Pain in right knee: Secondary | ICD-10-CM | POA: Diagnosis not present

## 2021-05-08 DIAGNOSIS — M6281 Muscle weakness (generalized): Secondary | ICD-10-CM | POA: Diagnosis not present

## 2021-05-12 ENCOUNTER — Other Ambulatory Visit: Payer: Self-pay

## 2021-05-12 ENCOUNTER — Encounter: Payer: Self-pay | Admitting: Physical Therapy

## 2021-05-12 ENCOUNTER — Ambulatory Visit: Payer: Medicare Other | Admitting: Physical Therapy

## 2021-05-12 DIAGNOSIS — M25562 Pain in left knee: Secondary | ICD-10-CM | POA: Diagnosis not present

## 2021-05-12 DIAGNOSIS — M25561 Pain in right knee: Secondary | ICD-10-CM | POA: Diagnosis not present

## 2021-05-12 DIAGNOSIS — G8929 Other chronic pain: Secondary | ICD-10-CM | POA: Diagnosis not present

## 2021-05-12 DIAGNOSIS — M545 Low back pain, unspecified: Secondary | ICD-10-CM

## 2021-05-12 DIAGNOSIS — M6281 Muscle weakness (generalized): Secondary | ICD-10-CM

## 2021-05-12 DIAGNOSIS — R2689 Other abnormalities of gait and mobility: Secondary | ICD-10-CM

## 2021-05-12 NOTE — Therapy (Signed)
?OUTPATIENT PHYSICAL THERAPY TREATMENT NOTE ? ? ?Patient Name: Mallory Klein ?MRN: [id number] ?DOB:03-09-1945, 75 y.o., female ?Today's Date: 05/12/2021 ? ?PCP: Plotnikov, Evie Lacks, MD ?REFERRING PROVIDER: Cassandria Anger, MD ? ? PT End of Session - 05/12/21 1456   ? ? Visit Number 5   ? Number of Visits 17   ? Date for PT Re-Evaluation 06/28/21   ? Authorization Type UHC MCR   ? Progress Note Due on Visit 10   ? PT Start Time 1451   ? PT Stop Time 1530   ? PT Time Calculation (min) 39 min   ? Activity Tolerance Patient tolerated treatment well   ? Behavior During Therapy [facility] for tasks assessed/performed   ? ?  ?  ? ?  ? ? ? ? ?Past Medical History:  ?Diagnosis Date  ? Abnormal CBC 02/05/2015  ? [name] 12/16 new: normocytic anemia and thrombocytosis  ? Adjustment disorder with mixed anxiety and depressed mood 02/15/2007  ? Chronic  Chronic pain Grief, stress Effexor XR  Dad died in 02/13/23  ? Asthma   ? Diverticulosis of colon (without mention of hemorrhage)   ? Esophageal stricture   ? Essential hypertension 01/26/2007  ? Chronic Verapamil   ? Falls   ? "I blackout"  ? GERD (gastroesophageal reflux disease) 02/15/2007  ? Chronic    ? Heart murmur   ? Hyperglycemia 03/02/2011  ? Mild    ? Insomnia disorder 12/08/2018  ? 10/20 Carbid/Lev dose was increased - c/o hard time falling asleep (6 am) getting up at 12 am, poor sleep. Try Temazepam 15-30 mg at 11-1:30 pm   ? Internal hemorrhoids without mention of complication   ? Iron deficiency anemia 04/23/2017  ? Mild neurocognitive disorder, likely due to Parkinson's disease 02/09/2019  ? OSA (obstructive sleep apnea) 02/14/2010  ? In lab study (May 2018): AHI 25. Only had 45 minutes of sleep secondary to frequent awakenings secondary to sleep apnea. autocpap 5-15 cm water.   ? Osteoarthritis   ? Parkinson disease ([place]) 02/12/2014  ? 2015 2017  Primidone - d/c 2019 Parkinson's: Sinmet IR. Dr Tat  ? Postherpetic trigeminal neuralgia 01/26/2007  ? Qualifier:  Diagnosis of  By: Reatha Armour, [name]    ? Shortness of breath dyspnea   ? with allergies  ? Thrombocytosis 05/24/2017  ? Urinary incontinence 01/26/2007  ? Chronic  10/17 Detrol LA   ? Vitamin D deficiency 01/26/2019  ? ?Past Surgical History:  ?Procedure Laterality Date  ? ABDOMINAL HYSTERECTOMY    ? BREAST CYST EXCISION Left 1980's  ? LUMBAR LAMINECTOMY/DECOMPRESSION MICRODISCECTOMY Bilateral 02/20/2014  ? Procedure: LUMBAR TWO THREE, LUMBAR THREE FOUR LUMBAR LAMINECTOMY/DECOMPRESSION MICRODISCECTOMY 2 LEVELS;  Surgeon: Eustace Moore, MD;  Location: [place] [facility];  Service: Neurosurgery;  Laterality: Bilateral;  ? SPLENECTOMY    ? TIBIA FRACTURE SURGERY Right   ? TIBIA FRACTURE SURGERY Left   ? TOTAL KNEE ARTHROPLASTY Right 2013  ? ?Patient Active Problem List  ? Diagnosis Date Noted  ? Complete tear of right rotator cuff 08/22/2020  ? Syncope and collapse 08/06/2020  ? Shoulder contusion 07/31/2020  ? Fall 07/31/2020  ? Concussion 07/31/2020  ? Anosmia 05/02/2020  ? Grief reaction 03/27/2020  ? Memory loss 12/13/2019  ? Food poisoning 09/11/2019  ? B12 deficiency 06/14/2019  ? Mild neurocognitive disorder, likely due to Parkinson's disease 02/09/2019  ? Vitamin D deficiency 01/26/2019  ? Sweating profusely 12/08/2018  ? Insomnia disorder 12/08/2018  ? Cerumen impaction 10/19/2018  ?  Lumbar facet arthropathy 02/10/2018  ? Thrombocytosis 05/24/2017  ? Iron deficiency anemia 04/23/2017  ? Cough 05/12/2016  ? Pseudophakia of both eyes 10/09/2015  ? Abnormal CBC 02/05/2015  ? Edema 10/15/2014  ? S/P lumbar laminectomy 02/20/2014  ? Pre-ulcerative calluses 02/09/2014  ? Parkinson disease ([place]) 02/09/2014  ? Cold sore 10/17/2013  ? Low back pain 07/25/2013  ? Nuclear sclerosis 02/08/2013  ? Heel ulcer ([place]) 03/08/2012  ? Rash 12/04/2011  ? Neck pain on left side 08/06/2011  ? Presence of unspecified artificial knee joint 04/22/2011  ? Hyperglycemia 03/02/2011  ? Acquired spondylolisthesis 12/25/2010  ? Degeneration of  lumbosacral intervertebral disc 12/25/2010  ? Arthritis of right knee 12/03/2010  ? Knee pain, right 09/08/2010  ? OSA (obstructive sleep apnea) 02/14/2010  ? Upper respiratory infection 01/13/2010  ? BRONCHITIS, ACUTE 04/05/2009  ? Vertigo 12/02/2007  ? Other abnormal glucose 12/02/2007  ? Otitis media 10/26/2007  ? Other malaise and fatigue 05/11/2007  ? Adjustment disorder with mixed anxiety and depressed mood 02/15/2007  ? GERD (gastroesophageal reflux disease) 02/15/2007  ? Osteoarthritis 02/15/2007  ? Postherpetic trigeminal neuralgia 01/26/2007  ? Herpes zoster 01/26/2007  ? Morbid obesity 01/26/2007  ? Migraine headaches 01/26/2007  ? Essential hypertension 01/26/2007  ? Asthma 01/26/2007  ? Urinary incontinence 01/26/2007  ? ? ?REFERRING PROVIDER: Margaretha Sheffield, MD  ?  ?REFERRING DIAG:  ?Lumbar spondylosis, stenosis  ?Lt knee OA ?Parkinson's disease  ? ?THERAPY DIAG:  ?Other abnormalities of gait and mobility ? ?Chronic pain of left knee ? ?Chronic bilateral low back pain, unspecified whether sciatica present ? ?Chronic pain of right knee ? ?Muscle weakness (generalized) ? ?PERTINENT HISTORY:  ?Rt TKA 2013 ?Lumbar laminectomy 2015  ? ?PRECAUTIONS: Fall ? ?SUBJECTIVE: Patient reports she is having more pain in her knees today. She doesn't know why. ? ?PAIN:  ?Are you having pain? Yes ?NPRS scale: 8-9/10 ?Pain location: bilat knees, low back  ?PAIN TYPE: ache, dull, sharp ?Pain description: constant  ?Aggravating factors: weather, standing, walking, transfers ?Relieving factors: medication  ? ?PATIENT GOALS: "stop hurting"  ? ? ?OBJECTIVE:  ?POSTURE:  ?Increased kyphosis, slumped posture in sitting  ?  ?LE MMT: ?  ?MMT Right ?04/30/2021 Left ?04/30/2021  ?Hip flexion 3-/5 3-/5   ?Hip extension      ?Hip abduction      ?Hip adduction      ?Hip internal rotation      ?Hip external rotation      ?Knee flexion 4+/5 4+/5   ?Knee extension 4+/5 4+/5  ?Ankle dorsiflexion 5/5 5/5  ?Ankle plantarflexion      ?Ankle  inversion      ?Ankle eversion      ?  ?FUNCTIONAL TESTS:  ?30 sec STS: 0  ?TUG: 47 seconds - 05/12/2021 (63 seconds at eval) ?Sit to supine: Mod A ?Supine to sit: Mod I  ?Sit to stand: Mod A ?Stand to Sit: CGA ? ?2MWT: 160 ft ? ?05/02/2021 - patient able to perform all transfers mod I with extra time required and frequent cueing for technique and effort ?  ?GAIT: ?Distance walked: 160 ft  ?Assistive device utilized: Environmental consultant - 4 wheeled ?Level of assistance: Supervision ?Comments: shuffling, forward flexed posture, decreased step length  ?  ?  ?  ?TODAY'S TREATMENT  ?[facility] Adult PT Treatment:  DATE: 05/12/2021 ?Therapeutic Exercise: ?NuStep L5 x 5 min with UE/LE while taking subjective ?Sit to stand 2 x 5 - from elevated table with rollator in front, max cueing for forward trunk lean to unweight bottom to rise ?LAQ with 3# 2 x 10 each ?Seated clamshell with green 2 x 15 ?Seated alternating march with 2# 2 x 20 ?Seated row with green 2 x 10 ?2MWT for workload capacity training ? ? ?[facility] Adult PT Treatment:                                                DATE: 05/08/2021 ?Therapeutic Exercise: ?NuStep L5 x 5 min with UE/LE while taking subjective ?Sit to stand 2 x 5 - from elevated table with rollator in front, max cueing for forward trunk lean to unweight bottom to rise ?LAQ with 2# 2 x 10 each ?Seated alternating march with 2# 2 x 20 ?Seated row with green 2 x 10 ?Sidelying hip abduction 2 x 10 each - partial range ?SLR x 10 each ? ?[facility] Adult PT Treatment:                                                DATE: 05/06/2021 ?Therapeutic Exercise: ?10 minutes late ?Sit to stand x 5 - from elevated table with rollator in front, max cueing for forward trunk lean to unweight bottom to rise ?Seated LAQ and marching 10 x 2 each ?SLR 2 x 10 each - partial range (rest breaks)  ?SAQ x 20 - legs on high bolster ?Small bridge- legs on tall bolster ?Sidelying hip abduction 2 x 10 each - partial  range  ?Left clam x 10  ?Self Care: discussed potential removeable handrail to add under mattress for safety with transitions and bed mobility.  ?  ?PATIENT EDUCATION:  ?Education details: HEP update ?Person educat

## 2021-05-12 NOTE — Patient Instructions (Signed)
Access Code: [email or web address] ?URL: [email or web address] ?Date: 05/12/2021 ?Prepared by: Hilda Blades ? ?Exercises ?Small Range Straight Leg Raise - 1 x daily - 2 sets - 10 reps ?Sidelying Hip Abduction - 1 x daily - 2 sets - 10 reps ?Seated Long Arc Quad - 1 x daily - 2 sets - 15 reps ?Sit to Stand with Counter Support - 1 x daily - 5 reps ?Seated Hip Abduction with Resistance - 1 x daily - 2 sets - 15 reps ?Seated March - 1 x daily - 2 sets - 10 reps ? ?

## 2021-05-14 ENCOUNTER — Encounter: Payer: Self-pay | Admitting: Internal Medicine

## 2021-05-14 ENCOUNTER — Ambulatory Visit (INDEPENDENT_AMBULATORY_CARE_PROVIDER_SITE_OTHER): Payer: Medicare Other

## 2021-05-14 ENCOUNTER — Other Ambulatory Visit: Payer: Self-pay

## 2021-05-14 ENCOUNTER — Ambulatory Visit (INDEPENDENT_AMBULATORY_CARE_PROVIDER_SITE_OTHER): Payer: Medicare Other | Admitting: Internal Medicine

## 2021-05-14 VITALS — BP 134/76 | HR 88 | Temp 98.3°F | Ht 61.5 in | Wt 176.4 lb

## 2021-05-14 DIAGNOSIS — R739 Hyperglycemia, unspecified: Secondary | ICD-10-CM | POA: Diagnosis not present

## 2021-05-14 DIAGNOSIS — E538 Deficiency of other specified B group vitamins: Secondary | ICD-10-CM

## 2021-05-14 DIAGNOSIS — F19982 Other psychoactive substance use, unspecified with psychoactive substance-induced sleep disorder: Secondary | ICD-10-CM

## 2021-05-14 DIAGNOSIS — Z0000 Encounter for general adult medical examination without abnormal findings: Secondary | ICD-10-CM | POA: Diagnosis not present

## 2021-05-14 DIAGNOSIS — M5440 Lumbago with sciatica, unspecified side: Secondary | ICD-10-CM | POA: Diagnosis not present

## 2021-05-14 DIAGNOSIS — G8929 Other chronic pain: Secondary | ICD-10-CM | POA: Diagnosis not present

## 2021-05-14 LAB — COMPREHENSIVE METABOLIC PANEL
ALT: 6 U/L (ref 0–35)
AST: 14 U/L (ref 0–37)
Albumin: 4 g/dL (ref 3.5–5.2)
Alkaline Phosphatase: 55 U/L (ref 39–117)
BUN: 12 mg/dL (ref 6–23)
CO2: 34 mEq/L — ABNORMAL HIGH (ref 19–32)
Calcium: 9.4 mg/dL (ref 8.4–10.5)
Chloride: 104 mEq/L (ref 96–112)
Creatinine, Ser: 0.95 mg/dL (ref 0.40–1.20)
GFR: 58.65 mL/min — ABNORMAL LOW (ref >60.00)
Glucose, Bld: 92 mg/dL (ref 70–99)
Potassium: 3.9 mEq/L (ref 3.5–5.1)
Sodium: 143 mEq/L (ref 135–145)
Total Bilirubin: 0.6 mg/dL (ref 0.2–1.2)
Total Protein: 7.2 g/dL (ref 6.0–8.3)

## 2021-05-14 LAB — HEMOGLOBIN A1C: Hgb A1c MFr Bld: 5.8 % (ref 4.6–6.5)

## 2021-05-14 MED ORDER — HYDROXYZINE PAMOATE 50 MG PO CAPS: 50.0000 mg | ORAL_CAPSULE | Freq: Three times a day (TID) | ORAL | 5 refills | Status: DC | PRN

## 2021-05-14 MED ORDER — CELECOXIB 200 MG PO CAPS: 200.0000 mg | ORAL_CAPSULE | Freq: Two times a day (BID) | ORAL | 3 refills | Status: DC | PRN

## 2021-05-14 NOTE — Assessment & Plan Note (Signed)
Try Blue-Emu ?

## 2021-05-14 NOTE — Assessment & Plan Note (Signed)
D/c Temazepam - no help ?Start Hydroxyzine 50-100 mg at hs ?

## 2021-05-14 NOTE — Patient Instructions (Signed)
Try Blue-Emu ?

## 2021-05-14 NOTE — Assessment & Plan Note (Signed)
On B12 

## 2021-05-14 NOTE — Progress Notes (Signed)
? ?Subjective:  ?Patient ID: Mallory Klein, female    DOB: 07/15/1945  Age: 76 y.o. MRN: [id number] ? ?CC: Follow-up ? ? ?HPI ?Gardiner Rhyme presents for Insomnia, LBP, OA ? ?Outpatient Medications Prior to Visit  ?Medication Sig Dispense Refill  ? buprenorphine (BUTRANS) 7.5 MCG/HR 1 patch once a week.    ? cetirizine (ZYRTEC) 10 MG tablet TAKE 1 TABLET BY MOUTH EVERY DAY 90 tablet 3  ? Cholecalciferol 25 MCG (1000 UT) tablet Take by mouth.    ? [facility] SENNA PLUS 8.6-50 MG tablet Take 1 tablet by mouth 2 (two) times daily.    ? cyanocobalamin (,VITAMIN B-12,) 1000 MCG/ML injection INJECT 1 ML INTO THE SKIN EVERY 14 DAYS 10 mL 3  ? diclofenac Sodium (VOLTAREN) 1 % GEL Apply 4 g topically daily as needed (pain).    ? docusate sodium (COLACE) 100 MG capsule Take 100 mg by mouth at bedtime.    ? furosemide (LASIX) 20 MG tablet Take 1-2 tablets (20-40 mg total) by mouth daily as needed. 60 tablet 3  ? hydrochlorothiazide (MICROZIDE) 12.5 MG capsule Take 12.5 mg by mouth daily.    ? montelukast (SINGULAIR) 10 MG tablet TAKE 1 TABLET BY MOUTH  DAILY 90 tablet 3  ? OVER THE COUNTER MEDICATION Take 1 tablet by mouth at bedtime. Lions mane extract    ? pantoprazole (PROTONIX) 40 MG tablet TAKE 1 TABLET BY MOUTH  DAILY 90 tablet 3  ? pramipexole (MIRAPEX) 0.75 MG tablet Take 1 tablet (0.75 mg total) by mouth 3 (three) times daily. 270 tablet 3  ? promethazine-dextromethorphan (PROMETHAZINE-DM) 6.25-15 MG/5ML syrup Take 5 mLs by mouth 4 (four) times daily as needed for cough. 240 mL 0  ? verapamil (CALAN-SR) 180 MG CR tablet Take 1 tablet (180 mg total) by mouth at bedtime. 90 tablet 3  ? celecoxib (CELEBREX) 200 MG capsule Take 1 capsule (200 mg total) by mouth 2 (two) times daily as needed for moderate pain. 60 capsule 3  ? hydrOXYzine (VISTARIL) 25 MG capsule Take 25 mg by mouth once.    ? temazepam (RESTORIL) 15 MG capsule 15-30 mg at bedtime as needed insomnia 60 capsule 3  ? buprenorphine (BUTRANS) 10 MCG/HR PTWK 1  patch once a week. (Patient not taking: Reported on 04/09/2021)    ? polyethylene glycol (MIRALAX / GLYCOLAX) 17 g packet Take 17 g by mouth daily as needed for mild constipation. (Patient not taking: Reported on 04/09/2021)    ? rosuvastatin (CRESTOR) 10 MG tablet Take 1 tablet (10 mg total) by mouth daily. 90 tablet 3  ? ?No facility-administered medications prior to visit.  ? ? ?ROS: ?Review of Systems  ?Constitutional:  Negative for activity change, appetite change, chills, fatigue and unexpected weight change.  ?HENT:  Negative for congestion, mouth sores and sinus pressure.   ?Eyes:  Negative for visual disturbance.  ?Respiratory:  Negative for cough and chest tightness.   ?Gastrointestinal:  Negative for abdominal pain and nausea.  ?Genitourinary:  Negative for difficulty urinating, frequency and vaginal pain.  ?Musculoskeletal:  Positive for arthralgias and back pain. Negative for gait problem.  ?Skin:  Negative for pallor and rash.  ?Neurological:  Negative for dizziness, tremors, weakness, numbness and headaches.  ?Psychiatric/Behavioral:  Positive for sleep disturbance. Negative for confusion and suicidal ideas. The patient is nervous/anxious.   ? ?Objective:  ?BP 134/76   Pulse 88   Temp 98.3 ?F (36.8 ?C) (Oral)   Ht 5' 1.5" (1.562 m)  Wt 176 lb 6 oz (80 kg)   SpO2 96%   BMI 32.79 kg/m?  ? ?BP Readings from Last 3 Encounters:  ?05/14/21 134/76  ?05/14/21 134/76  ?04/09/21 114/66  ? ? ?Wt Readings from Last 3 Encounters:  ?05/14/21 176 lb 5.9 oz (80 kg)  ?05/14/21 176 lb 6 oz (80 kg)  ?04/09/21 168 lb 9.6 oz (76.5 kg)  ? ? ?Physical Exam ?Constitutional:   ?   General: She is not in acute distress. ?   Appearance: She is well-developed.  ?HENT:  ?   Head: Normocephalic.  ?   Right Ear: External ear normal.  ?   Left Ear: External ear normal.  ?   Nose: Nose normal.  ?Eyes:  ?   General:     ?   Right eye: No discharge.     ?   Left eye: No discharge.  ?   Conjunctiva/sclera: Conjunctivae normal.  ?    Pupils: Pupils are equal, round, and reactive to light.  ?Neck:  ?   Thyroid: No thyromegaly.  ?   Vascular: No JVD.  ?   Trachea: No tracheal deviation.  ?Cardiovascular:  ?   Rate and Rhythm: Normal rate and regular rhythm.  ?   Heart sounds: Normal heart sounds.  ?Pulmonary:  ?   Effort: No respiratory distress.  ?   Breath sounds: No stridor. No wheezing.  ?Abdominal:  ?   General: Bowel sounds are normal. There is no distension.  ?   Palpations: Abdomen is soft. There is no mass.  ?   Tenderness: There is no abdominal tenderness. There is no guarding or rebound.  ?Musculoskeletal:     ?   General: No tenderness.  ?   Cervical back: Normal range of motion and neck supple. No rigidity.  ?Lymphadenopathy:  ?   Cervical: No cervical adenopathy.  ?Skin: ?   Findings: No erythema or rash.  ?Neurological:  ?   Mental Status: She is oriented to person, place, and time.  ?   Cranial Nerves: No cranial nerve deficit.  ?   Motor: No abnormal muscle tone.  ?   Coordination: Coordination abnormal.  ?   Gait: Gait abnormal.  ?   Deep Tendon Reflexes: Reflexes normal.  ?Psychiatric:     ?   Behavior: Behavior normal.     ?   Thought Content: Thought content normal.     ?   Judgment: Judgment normal.  ? ?Antalgic gait ?Using a walker ? ?Lab Results  ?Component Value Date  ? WBC 8.1 02/21/2021  ? HGB 12.4 02/21/2021  ? HCT 38.9 02/21/2021  ? PLT 289 02/21/2021  ? GLUCOSE 116 (H) 02/21/2021  ? CHOL 200 (H) 01/07/2021  ? TRIG 53 01/07/2021  ? HDL 79 01/07/2021  ? LDLDIRECT 109.8 02/10/2010  ? LDLCALC 111 (H) 01/07/2021  ? ALT 2 06/25/2020  ? AST 11 06/25/2020  ? NA 141 02/21/2021  ? K 3.7 02/21/2021  ? CL 103 02/21/2021  ? CREATININE 0.94 02/21/2021  ? BUN 16 02/21/2021  ? CO2 34 (H) 02/21/2021  ? TSH 0.67 06/25/2020  ? INR 0.97 02/07/2014  ? HGBA1C 5.5 06/25/2020  ? ? ?VAS [place] LOWER EXTREMITY VENOUS (DVT) ? ?Result Date: 03/17/2021 ? Lower Venous DVT Study Patient Name:  Mallory Klein [facility]  Date of Exam:   03/17/2021 Medical Rec #:  [id number]          Accession #:    [id number] Date of Birth:  August 27, 1945         Patient Gender: F Patient Age:   34 years Exam Location:  [facility] Procedure:      VAS [place] LOWER EXTREMITY VENOUS (DVT) Referring Phys: Cathlean Cower --------------------------------------------------------------------------------  Indications: Patient denies any shortness of breath or chest pain. Patient states swelling in both legs from knee down for the past two months.  Risk Factors: Surgery Total right knee replacement 2013. Comparison Study: NA Performing Technologist: Leavy Cella RDCS  Examination Guidelines: A complete evaluation includes B-mode imaging, spectral Doppler, color Doppler, and power Doppler as needed of all accessible portions of each vessel. Bilateral testing is considered an integral part of a complete examination. Limited examinations for reoccurring indications may be performed as noted. The reflux portion of the exam is performed with the patient in reverse Trendelenburg.  +---------+---------------+---------+-----------+----------+--------------+ RIGHT    CompressibilityPhasicitySpontaneityPropertiesThrombus Aging +---------+---------------+---------+-----------+----------+--------------+ CFV      Full           Yes      Yes                                 +---------+---------------+---------+-----------+----------+--------------+ SFJ      Full           Yes      Yes                                 +---------+---------------+---------+-----------+----------+--------------+ FV Prox  Full           Yes      Yes                                 +---------+---------------+---------+-----------+----------+--------------+ FV Mid   Full           Yes      Yes                                 +---------+---------------+---------+-----------+----------+--------------+ FV DistalFull           Yes      Yes                                  +---------+---------------+---------+-----------+----------+--------------+ PFV      Full                                                        +---------+---------------+---------+-----------+----------+--------------+ POP      Full           Yes      Yes

## 2021-05-14 NOTE — Patient Instructions (Signed)
Mallory Klein , ?Thank you for taking time to come for your Medicare Wellness Visit. I appreciate your ongoing commitment to your health goals. Please review the following plan we discussed and let me know if I can assist you in the future.  ? ?Screening recommendations/referrals: ?Colonoscopy: 03/31/2018; due every 3 years ?Mammogram: 08/03/2017 ?Bone Density: 12/20/2017; due every 3 years ?Recommended yearly ophthalmology/optometry visit for glaucoma screening and checkup ?Recommended yearly dental visit for hygiene and checkup ? ?Vaccinations: ?Influenza vaccine: 12/05/2020 ?Pneumococcal vaccine: 03/02/2011, 04/25/2013 ?Tdap vaccine: 07/19/2012; due every 10 years ?Shingles vaccine: never done   ?Covid-19: 04/16/2019, 05/10/2019, 02/13/2020 ? ?Advanced directives: Advance directive discussed with you today. Even though you declined this today please call our office should you change your mind and we can give you the proper paperwork for you to fill out. ? ?Conditions/risks identified: Yes; Client understands the importance of follow-up appointments with providers by attending scheduled visits and discussed goals to eat healthier, increase physical activity 5 times a week for 30 minutes each, exercise the brain by doing stimulating brain exercises (reading, adult coloring, crafting, listening to music, puzzles, etc.), socialize and enjoy life more, get enough sleep at least 8-9 hours average per night and make time for laughter. ? ?Next appointment: Please schedule your next Medicare Wellness Visit with your Nurse Health Advisor in 1 year or 366 days by calling [phone]. ? ? ?Preventive Care 8 Years and Older, Female ?Preventive care refers to lifestyle choices and visits with your health care provider that can promote health and wellness. ?What does preventive care include? ?A yearly physical exam. This is also called an annual well check. ?Dental exams once or twice a year. ?Routine eye exams. Ask your health care  provider how often you should have your eyes checked. ?Personal lifestyle choices, including: ?Daily care of your teeth and gums. ?Regular physical activity. ?Eating a healthy diet. ?Avoiding tobacco and drug use. ?Limiting alcohol use. ?Practicing safe sex. ?Taking low-dose aspirin every day. ?Taking vitamin and mineral supplements as recommended by your health care provider. ?What happens during an annual well check? ?The services and screenings done by your health care provider during your annual well check will depend on your age, overall health, lifestyle risk factors, and family history of disease. ?Counseling  ?Your health care provider may ask you questions about your: ?Alcohol use. ?Tobacco use. ?Drug use. ?Emotional well-being. ?Home and relationship well-being. ?Sexual activity. ?Eating habits. ?History of falls. ?Memory and ability to understand (cognition). ?Work and work Statistician. ?Reproductive health. ?Screening  ?You may have the following tests or measurements: ?Height, weight, and BMI. ?Blood pressure. ?Lipid and cholesterol levels. These may be checked every 5 years, or more frequently if you are over 57 years old. ?Skin check. ?Lung cancer screening. You may have this screening every year starting at age 34 if you have a 30-pack-year history of smoking and currently smoke or have quit within the past 15 years. ?Fecal occult blood test (FOBT) of the stool. You may have this test every year starting at age 37. ?Flexible sigmoidoscopy or colonoscopy. You may have a sigmoidoscopy every 5 years or a colonoscopy every 10 years starting at age 39. ?Hepatitis C blood test. ?Hepatitis B blood test. ?Sexually transmitted disease (STD) testing. ?Diabetes screening. This is done by checking your blood sugar (glucose) after you have not eaten for a while (fasting). You may have this done every 1-3 years. ?Bone density scan. This is done to screen for osteoporosis. You may have this  done starting at age  47. ?Mammogram. This may be done every 1-2 years. Talk to your health care provider about how often you should have regular mammograms. ?Talk with your health care provider about your test results, treatment options, and if necessary, the need for more tests. ?Vaccines  ?Your health care provider may recommend certain vaccines, such as: ?Influenza vaccine. This is recommended every year. ?Tetanus, diphtheria, and acellular pertussis (Tdap, Td) vaccine. You may need a Td booster every 10 years. ?Zoster vaccine. You may need this after age 15. ?Pneumococcal 13-valent conjugate (PCV13) vaccine. One dose is recommended after age 78. ?Pneumococcal polysaccharide (PPSV23) vaccine. One dose is recommended after age 96. ?Talk to your health care provider about which screenings and vaccines you need and how often you need them. ?This information is not intended to replace advice given to you by your health care provider. Make sure you discuss any questions you have with your health care provider. ?Document Released: 03/08/2015 Document Revised: 10/30/2015 Document Reviewed: 12/11/2014 ?Elsevier Interactive Patient Education ? 2017 [place]. ? ?Fall Prevention in the Home ?Falls can cause injuries. They can happen to people of all ages. There are many things you can do to make your home safe and to help prevent falls. ?What can I do on the outside of my home? ?Regularly fix the edges of walkways and driveways and fix any cracks. ?Remove anything that might make you trip as you walk through a door, such as a raised step or threshold. ?Trim any bushes or trees on the path to your home. ?Use bright outdoor lighting. ?Clear any walking paths of anything that might make someone trip, such as rocks or tools. ?Regularly check to see if handrails are loose or broken. Make sure that both sides of any steps have handrails. ?Any raised decks and porches should have guardrails on the edges. ?Have any leaves, snow, or ice cleared  regularly. ?Use sand or salt on walking paths during winter. ?Clean up any spills in your garage right away. This includes oil or grease spills. ?What can I do in the bathroom? ?Use night lights. ?Install grab bars by the toilet and in the tub and shower. Do not use towel bars as grab bars. ?Use non-skid mats or decals in the tub or shower. ?If you need to sit down in the shower, use a plastic, non-slip stool. ?Keep the floor dry. Clean up any water that spills on the floor as soon as it happens. ?Remove soap buildup in the tub or shower regularly. ?Attach bath mats securely with double-sided non-slip rug tape. ?Do not have throw rugs and other things on the floor that can make you trip. ?What can I do in the bedroom? ?Use night lights. ?Make sure that you have a light by your bed that is easy to reach. ?Do not use any sheets or blankets that are too big for your bed. They should not hang down onto the floor. ?Have a firm chair that has side arms. You can use this for support while you get dressed. ?Do not have throw rugs and other things on the floor that can make you trip. ?What can I do in the kitchen? ?Clean up any spills right away. ?Avoid walking on wet floors. ?Keep items that you use a lot in easy-to-reach places. ?If you need to reach something above you, use a strong step stool that has a grab bar. ?Keep electrical cords out of the way. ?Do not use floor polish or wax  that makes floors slippery. If you must use wax, use non-skid floor wax. ?Do not have throw rugs and other things on the floor that can make you trip. ?What can I do with my stairs? ?Do not leave any items on the stairs. ?Make sure that there are handrails on both sides of the stairs and use them. Fix handrails that are broken or loose. Make sure that handrails are as long as the stairways. ?Check any carpeting to make sure that it is firmly attached to the stairs. Fix any carpet that is loose or worn. ?Avoid having throw rugs at the top or  bottom of the stairs. If you do have throw rugs, attach them to the floor with carpet tape. ?Make sure that you have a light switch at the top of the stairs and the bottom of the stairs. If you do not have them,

## 2021-05-14 NOTE — Progress Notes (Addendum)
? ?Subjective:  ? Mallory Klein is a 76 y.o. female who presents for Medicare Annual (Subsequent) preventive examination. ? ?Review of Systems    ? ?Cardiac Risk Factors include: advanced age (>13mn, >>68women);family history of premature cardiovascular disease;hypertension;obesity (BMI >30kg/m2) ? ?   ?Objective:  ?  ?Today's Vitals  ? 05/14/21 1532  ?BP: 134/76  ?Pulse: 88  ?Temp: 98.3 ?F (36.8 ?C)  ?SpO2: 96%  ?Weight: 176 lb 5.9 oz (80 kg)  ?Height: 5' 1.5" (1.562 m)  ?PainSc: 0-No pain  ? ?Body mass index is 32.79 kg/m?. ? ? ?  04/29/2021  ?  3:06 PM 02/21/2021  ?  2:35 PM 12/10/2020  ?  8:44 PM 07/27/2020  ?  7:10 PM 07/21/2020  ? 12:10 AM 03/27/2020  ?  4:32 PM 07/05/2019  ?  2:21 PM  ?Advanced Directives  ?Does Patient Have a Medical Advance Directive? No No No No No No No  ?Would patient like information on creating a medical advance directive? No - Patient declined  Yes (MAU/Ambulatory/Procedural Areas - Information given)   No - Patient declined   ? ? ?Current Medications (verified) ?Outpatient Encounter Medications as of 05/14/2021  ?Medication Sig  ? buprenorphine (BUTRANS) 10 MCG/HR PTWK 1 patch once a week. (Patient not taking: Reported on 04/09/2021)  ? buprenorphine (BUTRANS) 7.5 MCG/HR 1 patch once a week.  ? cetirizine (ZYRTEC) 10 MG tablet TAKE 1 TABLET BY MOUTH EVERY DAY  ? Cholecalciferol 25 MCG (1000 UT) tablet Take by mouth.  ? [facility] SENNA PLUS 8.6-50 MG tablet Take 1 tablet by mouth 2 (two) times daily.  ? cyanocobalamin (,VITAMIN B-12,) 1000 MCG/ML injection INJECT 1 ML INTO THE SKIN EVERY 14 DAYS  ? diclofenac Sodium (VOLTAREN) 1 % GEL Apply 4 g topically daily as needed (pain).  ? docusate sodium (COLACE) 100 MG capsule Take 100 mg by mouth at bedtime.  ? furosemide (LASIX) 20 MG tablet Take 1-2 tablets (20-40 mg total) by mouth daily as needed.  ? hydrochlorothiazide (MICROZIDE) 12.5 MG capsule Take 12.5 mg by mouth daily.  ? montelukast (SINGULAIR) 10 MG tablet TAKE 1 TABLET BY MOUTH  DAILY   ? OVER THE COUNTER MEDICATION Take 1 tablet by mouth at bedtime. Lions mane extract  ? pantoprazole (PROTONIX) 40 MG tablet TAKE 1 TABLET BY MOUTH  DAILY  ? polyethylene glycol (MIRALAX / GLYCOLAX) 17 g packet Take 17 g by mouth daily as needed for mild constipation. (Patient not taking: Reported on 04/09/2021)  ? pramipexole (MIRAPEX) 0.75 MG tablet Take 1 tablet (0.75 mg total) by mouth 3 (three) times daily.  ? promethazine-dextromethorphan (PROMETHAZINE-DM) 6.25-15 MG/5ML syrup Take 5 mLs by mouth 4 (four) times daily as needed for cough.  ? rosuvastatin (CRESTOR) 10 MG tablet Take 1 tablet (10 mg total) by mouth daily.  ? verapamil (CALAN-SR) 180 MG CR tablet Take 1 tablet (180 mg total) by mouth at bedtime.  ? [DISCONTINUED] celecoxib (CELEBREX) 200 MG capsule Take 1 capsule (200 mg total) by mouth 2 (two) times daily as needed for moderate pain.  ? [DISCONTINUED] donepezil (ARICEPT) 5 MG tablet Take 1 tablet (5 mg total) by mouth at bedtime. (Patient not taking: No sig reported)  ? [DISCONTINUED] hydrOXYzine (VISTARIL) 25 MG capsule Take 25 mg by mouth once.  ? [DISCONTINUED] temazepam (RESTORIL) 15 MG capsule 15-30 mg at bedtime as needed insomnia  ? [DISCONTINUED] venlafaxine XR (EFFEXOR-XR) 150 MG 24 hr capsule TAKE 1 CAPSULE BY MOUTH  EVERY DAY WITH BREAKFAST (  Patient not taking: No sig reported)  ? ?No facility-administered encounter medications on file as of 05/14/2021.  ? ? ?Allergies (verified) ?Ace inhibitors, Benicar [olmesartan], Aspirin, and Citalopram hydrobromide  ? ?History: ?Past Medical History:  ?Diagnosis Date  ? Abnormal CBC 02/05/2015  ? [name] 12/16 new: normocytic anemia and thrombocytosis  ? Adjustment disorder with mixed anxiety and depressed mood 02/15/2007  ? Chronic  Chronic pain Grief, stress Effexor XR  Dad died in 2023/02/14  ? Asthma   ? Diverticulosis of colon (without mention of hemorrhage)   ? Esophageal stricture   ? Essential hypertension 01/26/2007  ? Chronic Verapamil   ?  Falls   ? "I blackout"  ? GERD (gastroesophageal reflux disease) 02/15/2007  ? Chronic    ? Heart murmur   ? Hyperglycemia 03/02/2011  ? Mild    ? Insomnia disorder 12/08/2018  ? 10/20 Carbid/Lev dose was increased - c/o hard time falling asleep (6 am) getting up at 12 am, poor sleep. Try Temazepam 15-30 mg at 11-1:30 pm   ? Internal hemorrhoids without mention of complication   ? Iron deficiency anemia 04/23/2017  ? Mild neurocognitive disorder, likely due to Parkinson's disease 02/09/2019  ? OSA (obstructive sleep apnea) 02/14/2010  ? In lab study (May 2018): AHI 25. Only had 45 minutes of sleep secondary to frequent awakenings secondary to sleep apnea. autocpap 5-15 cm water.   ? Osteoarthritis   ? Parkinson disease ([place]) 02-13-14  ? 2015 2017  Primidone - d/c 2019 Parkinson's: [facility] IR. Dr Tat  ? Postherpetic trigeminal neuralgia 01/26/2007  ? Qualifier: Diagnosis of  By: Reatha Armour, [name]    ? Shortness of breath dyspnea   ? with allergies  ? Thrombocytosis 05/24/2017  ? Urinary incontinence 01/26/2007  ? Chronic  10/17 Detrol LA   ? Vitamin D deficiency 01/26/2019  ? ?Past Surgical History:  ?Procedure Laterality Date  ? ABDOMINAL HYSTERECTOMY    ? BREAST CYST EXCISION Left 1980's  ? LUMBAR LAMINECTOMY/DECOMPRESSION MICRODISCECTOMY Bilateral 02/20/2014  ? Procedure: LUMBAR TWO THREE, LUMBAR THREE FOUR LUMBAR LAMINECTOMY/DECOMPRESSION MICRODISCECTOMY 2 LEVELS;  Surgeon: Eustace Moore, MD;  Location: [facility];  Service: Neurosurgery;  Laterality: Bilateral;  ? SPLENECTOMY    ? TIBIA FRACTURE SURGERY Right   ? TIBIA FRACTURE SURGERY Left   ? TOTAL KNEE ARTHROPLASTY Right 2013  ? ?Family History  ?Problem Relation Age of Onset  ? Hypertension Mother   ? Diabetes type II Mother   ? Asthma Mother   ? Diabetes Mother   ? Dementia Mother   ? Prostate cancer Father   ? Mental illness Father   ?     dementia  ? Suicidality Son   ? Stroke Brother   ?     died in prison  ? Colon cancer Neg Hx   ? Rectal cancer Neg Hx    ? ?Social History  ? ?Socioeconomic History  ? Marital status: Married  ?  Spouse name: Not on file  ? Number of children: 2  ? Years of education: 45  ? Highest education level: Associate degree: occupational, Hotel manager, or vocational program  ?Occupational History  ? Occupation: Retired  ?  Comment: Network engineer, admin assist   ?Tobacco Use  ? Smoking status: Never  ? Smokeless tobacco: Never  ?Vaping Use  ? Vaping Use: Never used  ?Substance and Sexual Activity  ? Alcohol use: Yes  ?  Alcohol/week: 2.0 standard drinks  ?  Types: 2 Glasses of wine per  week  ?  Comment: wine on occ   ? Drug use: No  ? Sexual activity: Not Currently  ?Other Topics Concern  ? Not on file  ?Social History Narrative  ? Drink tea and coffee on occ  ? Right handed  ? 1 story   ? Lives with spouse  ? ?Social Determinants of Health  ? ?Financial Resource Strain: Low Risk   ? Difficulty of Paying Living Expenses: Not hard at all  ?Food Insecurity: No Food Insecurity  ? Worried About Charity fundraiser in the Last Year: Never true  ? Ran Out of Food in the Last Year: Never true  ?Transportation Needs: No Transportation Needs  ? Lack of Transportation (Medical): No  ? Lack of Transportation (Non-Medical): No  ?Physical Activity: Insufficiently Active  ? Days of Exercise per Week: 1 day  ? Minutes of Exercise per Session: 60 min  ?Stress: No Stress Concern Present  ? Feeling of Stress : Not at all  ?Social Connections: Socially Integrated  ? Frequency of Communication with Friends and Family: More than three times a week  ? Frequency of Social Gatherings with Friends and Family: More than three times a week  ? Attends Religious Services: More than 4 times per year  ? Active Member of Clubs or Organizations: Yes  ? Attends Archivist Meetings: More than 4 times per year  ? Marital Status: Married  ? ? ?Tobacco Counseling ?Counseling given: Not Answered ? ? ?Clinical Intake: ? ?Pre-visit preparation completed: Yes ? ?Pain : No/denies  pain ?Pain Score: 0-No pain ? ?  ? ?BMI - recorded: 32.79 ?Nutritional Status: BMI > 30  Obese ?Nutritional Risks: None ?Diabetes: No ? ?How often do you need to have someone help you when you read instru

## 2021-05-15 ENCOUNTER — Telehealth: Payer: Self-pay | Admitting: Internal Medicine

## 2021-05-15 DIAGNOSIS — R269 Unspecified abnormalities of gait and mobility: Secondary | ICD-10-CM

## 2021-05-15 DIAGNOSIS — G8929 Other chronic pain: Secondary | ICD-10-CM

## 2021-05-15 DIAGNOSIS — M159 Polyosteoarthritis, unspecified: Secondary | ICD-10-CM

## 2021-05-15 NOTE — Telephone Encounter (Signed)
Pt requesting a rx for a rollator walker w/ a seat ? ?Pt states her current walker is broken  ?

## 2021-05-16 ENCOUNTER — Ambulatory Visit: Payer: Medicare Other | Admitting: Physical Therapy

## 2021-05-16 NOTE — Telephone Encounter (Signed)
Mallory Klein, ?Please give her a DME prescription for rollator walker with a seat.  Diagnosis-low back pain, osteoarthritis, gait disorder. ?Thanks ?

## 2021-05-16 NOTE — Telephone Encounter (Signed)
DME placed on providers desk for signature. ?

## 2021-05-17 NOTE — Therapy (Incomplete)
?OUTPATIENT PHYSICAL THERAPY TREATMENT NOTE ? ? ?Patient Name: Mallory Klein ?MRN: [id number] ?DOB:Nov 21, 1945, 76 y.o., female ?Today's Date: 05/17/2021 ? ?PCP: Plotnikov, Evie Lacks, MD ?REFERRING PROVIDER: Cassandria Anger, MD ? ? ? ? ? ? ?Past Medical History:  ?Diagnosis Date  ? Abnormal CBC 02/05/2015  ? [name] 12/16 new: normocytic anemia and thrombocytosis  ? Adjustment disorder with mixed anxiety and depressed mood 02/15/2007  ? Chronic  Chronic pain Grief, stress Effexor XR  Dad died in 02-16-23  ? Asthma   ? Diverticulosis of colon (without mention of hemorrhage)   ? Esophageal stricture   ? Essential hypertension 01/26/2007  ? Chronic Verapamil   ? Falls   ? "I blackout"  ? GERD (gastroesophageal reflux disease) 02/15/2007  ? Chronic    ? Heart murmur   ? Hyperglycemia 03/02/2011  ? Mild    ? Insomnia disorder 12/08/2018  ? 10/20 Carbid/Lev dose was increased - c/o hard time falling asleep (6 am) getting up at 12 am, poor sleep. Try Temazepam 15-30 mg at 11-1:30 pm   ? Internal hemorrhoids without mention of complication   ? Iron deficiency anemia 04/23/2017  ? Mild neurocognitive disorder, likely due to Parkinson's disease 02/09/2019  ? OSA (obstructive sleep apnea) 02/14/2010  ? In lab study (May 2018): AHI 25. Only had 45 minutes of sleep secondary to frequent awakenings secondary to sleep apnea. autocpap 5-15 cm water.   ? Osteoarthritis   ? Parkinson disease ([place]) 02/15/14  ? 2015 2017  Primidone - d/c 2019 Parkinson's: [facility] IR. Dr Tat  ? Postherpetic trigeminal neuralgia 01/26/2007  ? Qualifier: Diagnosis of  By: Reatha Armour, [name]    ? Shortness of breath dyspnea   ? with allergies  ? Thrombocytosis 05/24/2017  ? Urinary incontinence 01/26/2007  ? Chronic  10/17 Detrol LA   ? Vitamin D deficiency 01/26/2019  ? ?Past Surgical History:  ?Procedure Laterality Date  ? ABDOMINAL HYSTERECTOMY    ? BREAST CYST EXCISION Left 1980's  ? LUMBAR LAMINECTOMY/DECOMPRESSION MICRODISCECTOMY Bilateral  02/20/2014  ? Procedure: LUMBAR TWO THREE, LUMBAR THREE FOUR LUMBAR LAMINECTOMY/DECOMPRESSION MICRODISCECTOMY 2 LEVELS;  Surgeon: Eustace Moore, MD;  Location: [facility];  Service: Neurosurgery;  Laterality: Bilateral;  ? SPLENECTOMY    ? TIBIA FRACTURE SURGERY Right   ? TIBIA FRACTURE SURGERY Left   ? TOTAL KNEE ARTHROPLASTY Right 2013  ? ?Patient Active Problem List  ? Diagnosis Date Noted  ? Complete tear of right rotator cuff 08/22/2020  ? Syncope and collapse 08/06/2020  ? Shoulder contusion 07/31/2020  ? Fall 07/31/2020  ? Concussion 07/31/2020  ? Anosmia 05/02/2020  ? Grief reaction 03/27/2020  ? Memory loss 12/13/2019  ? Food poisoning 09/11/2019  ? B12 deficiency 06/14/2019  ? Mild neurocognitive disorder, likely due to Parkinson's disease 02/09/2019  ? Vitamin D deficiency 01/26/2019  ? Sweating profusely 12/08/2018  ? Insomnia disorder 12/08/2018  ? Cerumen impaction 10/19/2018  ? Lumbar facet arthropathy 02/10/2018  ? Thrombocytosis 05/24/2017  ? Iron deficiency anemia 04/23/2017  ? Cough 05/12/2016  ? Pseudophakia of both eyes 10/09/2015  ? Abnormal CBC 02/05/2015  ? Edema 10/15/2014  ? S/P lumbar laminectomy 02/20/2014  ? Pre-ulcerative calluses Feb 15, 2014  ? Parkinson disease ([place]) 2014/02/15  ? Cold sore 10/17/2013  ? Low back pain 07/25/2013  ? Nuclear sclerosis 02/08/2013  ? Heel ulcer ([place]) 03/08/2012  ? Rash 12/04/2011  ? Neck pain on left side 08/06/2011  ? Presence of unspecified artificial knee joint 04/22/2011  ?  Hyperglycemia 03/02/2011  ? Acquired spondylolisthesis 12/25/2010  ? Degeneration of lumbosacral intervertebral disc 12/25/2010  ? Arthritis of right knee 12/03/2010  ? Knee pain, right 09/08/2010  ? OSA (obstructive sleep apnea) 02/14/2010  ? Upper respiratory infection 01/13/2010  ? BRONCHITIS, ACUTE 04/05/2009  ? Vertigo 12/02/2007  ? Other abnormal glucose 12/02/2007  ? Otitis media 10/26/2007  ? Other malaise and fatigue 05/11/2007  ? Adjustment disorder with mixed anxiety  and depressed mood 02/15/2007  ? GERD (gastroesophageal reflux disease) 02/15/2007  ? Osteoarthritis 02/15/2007  ? Postherpetic trigeminal neuralgia 01/26/2007  ? Herpes zoster 01/26/2007  ? Morbid obesity 01/26/2007  ? Migraine headaches 01/26/2007  ? Essential hypertension 01/26/2007  ? Asthma 01/26/2007  ? Urinary incontinence 01/26/2007  ? ? ?REFERRING PROVIDER: Margaretha Sheffield, MD  ?  ?REFERRING DIAG:  ?Lumbar spondylosis, stenosis  ?Lt knee OA ?Parkinson's disease  ? ?THERAPY DIAG:  ?No diagnosis found. ? ?PERTINENT HISTORY:  ?Rt TKA 2013 ?Lumbar laminectomy 2015  ? ?PRECAUTIONS: Fall ? ?SUBJECTIVE: Patient reports she is having more pain in her knees today. She doesn't know why. ? ?PAIN:  ?Are you having pain? Yes ?NPRS scale: 8-9/10 ?Pain location: bilat knees, low back  ?PAIN TYPE: ache, dull, sharp ?Pain description: constant  ?Aggravating factors: weather, standing, walking, transfers ?Relieving factors: medication  ? ?PATIENT GOALS: "stop hurting"  ? ? ?OBJECTIVE:  ?POSTURE:  ?Increased kyphosis, slumped posture in sitting  ?  ?LE MMT: ?  ?MMT Right ?04/30/2021 Left ?04/30/2021  ?Hip flexion 3-/5 3-/5   ?Hip extension      ?Hip abduction      ?Hip adduction      ?Hip internal rotation      ?Hip external rotation      ?Knee flexion 4+/5 4+/5   ?Knee extension 4+/5 4+/5  ?Ankle dorsiflexion 5/5 5/5  ?Ankle plantarflexion      ?Ankle inversion      ?Ankle eversion      ?  ?FUNCTIONAL TESTS:  ?30 sec STS: 0  ?TUG: 47 seconds - 05/12/2021 (63 seconds at eval) ?Sit to supine: Mod A ?Supine to sit: Mod I  ?Sit to stand: Mod A ?Stand to Sit: CGA ? ?2MWT: 160 ft ? ?05/02/2021 - patient able to perform all transfers mod I with extra time required and frequent cueing for technique and effort ?  ?GAIT: ?Distance walked: 160 ft  ?Assistive device utilized: Environmental consultant - 4 wheeled ?Level of assistance: Supervision ?Comments: shuffling, forward flexed posture, decreased step length  ?  ?  ?  ?TODAY'S TREATMENT  ?[facility]  Treatment:                                                DATE: 05/19/2021 ?Therapeutic Exercise: ?NuStep L5 x 5 min with UE/LE while taking subjective ?Sit to stand 2 x 5 - from elevated table with rollator in front, max cueing for forward trunk lean to unweight bottom to rise ?LAQ with 3# 2 x 10 each ?Seated clamshell with green 2 x 15 ?Seated alternating march with 2# 2 x 20 ?Seated row with green 2 x 10 ?2MWT for workload capacity training ? ? ?[facility] Adult PT Treatment:  DATE: 05/12/2021 ?Therapeutic Exercise: ?NuStep L5 x 5 min with UE/LE while taking subjective ?Sit to stand 2 x 5 - from elevated table with rollator in front, max cueing for forward trunk lean to unweight bottom to rise ?LAQ with 3# 2 x 10 each ?Seated clamshell with green 2 x 15 ?Seated alternating march with 2# 2 x 20 ?Seated row with green 2 x 10 ?2MWT for workload capacity training ? ?[facility] Adult PT Treatment:                                                DATE: 05/08/2021 ?Therapeutic Exercise: ?NuStep L5 x 5 min with UE/LE while taking subjective ?Sit to stand 2 x 5 - from elevated table with rollator in front, max cueing for forward trunk lean to unweight bottom to rise ?LAQ with 2# 2 x 10 each ?Seated alternating march with 2# 2 x 20 ?Seated row with green 2 x 10 ?Sidelying hip abduction 2 x 10 each - partial range ?SLR x 10 each ?  ?PATIENT EDUCATION:  ?Education details: HEP update ?Person educated: Patient; spouse  ?Education method: Explanation, handout ?Education comprehension: Verbalized understanding ?  ?HOME EXERCISE PROGRAM: ?Access Code: [email or web address] ?  ? ?ASSESSMENT: ?CLINICAL IMPRESSION: ?Patient tolerated therapy well with no adverse effects. *** Patient would benefit from continued skilled PT to progress her strength and mobility in order to reduce pain and maximize functional mobility with transfers and walking. ? ?She did have greater difficulty with her standing this visit due to  knee pain, and continues to require frequent cueing for forward trunk lean to stand. She was able to complete LE strengthening with progression of resistance, but did not perform any supine exercises. She did demonst

## 2021-05-19 ENCOUNTER — Ambulatory Visit: Payer: Medicare Other | Admitting: Physical Therapy

## 2021-05-20 DIAGNOSIS — G894 Chronic pain syndrome: Secondary | ICD-10-CM | POA: Diagnosis not present

## 2021-05-20 DIAGNOSIS — M1712 Unilateral primary osteoarthritis, left knee: Secondary | ICD-10-CM | POA: Diagnosis not present

## 2021-05-20 DIAGNOSIS — M48061 Spinal stenosis, lumbar region without neurogenic claudication: Secondary | ICD-10-CM | POA: Diagnosis not present

## 2021-05-20 DIAGNOSIS — M47816 Spondylosis without myelopathy or radiculopathy, lumbar region: Secondary | ICD-10-CM | POA: Diagnosis not present

## 2021-05-27 NOTE — Therapy (Signed)
?OUTPATIENT PHYSICAL THERAPY TREATMENT NOTE ? ? ?Patient Name: Mallory Klein ?MRN: [id number] ?DOB:[birth year], 76 y.o., female ?Today's Date: 05/29/2021 ? ?PCP: Plotnikov, Evie Lacks, MD ?REFERRING PROVIDER: Margaretha Sheffield, MD ? ? PT End of Session - 05/29/21 1501   ? ? Visit Number 6   ? Number of Visits 17   ? Date for PT Re-Evaluation 06/28/21   ? Authorization Type UHC MCR   ? Progress Note Due on Visit 10   ? PT Start Time 6010   ? PT Stop Time 1530   ? PT Time Calculation (min) 33 min   ? Activity Tolerance Patient tolerated treatment well   ? Behavior During Therapy [facility] for tasks assessed/performed   ? ?  ?  ? ?  ? ? ? ? ? ?Past Medical History:  ?Diagnosis Date  ? Abnormal CBC 02/05/2015  ? [name] 12/16 new: normocytic anemia and thrombocytosis  ? Adjustment disorder with mixed anxiety and depressed mood 02/15/2007  ? Chronic  Chronic pain Grief, stress Effexor XR  Dad died in 2023-02-16  ? Asthma   ? Diverticulosis of colon (without mention of hemorrhage)   ? Esophageal stricture   ? Essential hypertension 01/26/2007  ? Chronic Verapamil   ? Falls   ? "I blackout"  ? GERD (gastroesophageal reflux disease) 02/15/2007  ? Chronic    ? Heart murmur   ? Hyperglycemia 03/02/2011  ? Mild    ? Insomnia disorder 12/08/2018  ? 10/20 Carbid/Lev dose was increased - c/o hard time falling asleep (6 am) getting up at 12 am, poor sleep. Try Temazepam 15-30 mg at 11-1:30 pm   ? Internal hemorrhoids without mention of complication   ? Iron deficiency anemia 04/23/2017  ? Mild neurocognitive disorder, likely due to Parkinson's disease 02/09/2019  ? OSA (obstructive sleep apnea) 02/14/2010  ? In lab study (May 2018): AHI 25. Only had 45 minutes of sleep secondary to frequent awakenings secondary to sleep apnea. autocpap 5-15 cm water.   ? Osteoarthritis   ? Parkinson disease ([place]) 02-15-2014  ? 2015 2017  Primidone - d/c 2019 Parkinson's: Sinmet IR. Dr Tat  ? Postherpetic trigeminal neuralgia 01/26/2007  ? Qualifier: Diagnosis  of  By: Reatha Armour, [name]    ? Shortness of breath dyspnea   ? with allergies  ? Thrombocytosis 05/24/2017  ? Urinary incontinence 01/26/2007  ? Chronic  10/17 Detrol LA   ? Vitamin D deficiency 01/26/2019  ? ?Past Surgical History:  ?Procedure Laterality Date  ? ABDOMINAL HYSTERECTOMY    ? BREAST CYST EXCISION Left 1980's  ? LUMBAR LAMINECTOMY/DECOMPRESSION MICRODISCECTOMY Bilateral 02/20/2014  ? Procedure: LUMBAR TWO THREE, LUMBAR THREE FOUR LUMBAR LAMINECTOMY/DECOMPRESSION MICRODISCECTOMY 2 LEVELS;  Surgeon: Eustace Moore, MD;  Location: [facility];  Service: Neurosurgery;  Laterality: Bilateral;  ? SPLENECTOMY    ? TIBIA FRACTURE SURGERY Right   ? TIBIA FRACTURE SURGERY Left   ? TOTAL KNEE ARTHROPLASTY Right 2013  ? ?Patient Active Problem List  ? Diagnosis Date Noted  ? Complete tear of right rotator cuff 08/22/2020  ? Syncope and collapse 08/06/2020  ? Shoulder contusion 07/31/2020  ? Fall 07/31/2020  ? Concussion 07/31/2020  ? Anosmia 05/02/2020  ? Grief reaction 03/27/2020  ? Memory loss 12/13/2019  ? Food poisoning 09/11/2019  ? B12 deficiency 06/14/2019  ? Mild neurocognitive disorder, likely due to Parkinson's disease 02/09/2019  ? Vitamin D deficiency 01/26/2019  ? Sweating profusely 12/08/2018  ? Insomnia disorder 12/08/2018  ? Cerumen impaction 10/19/2018  ?  Lumbar facet arthropathy 02/10/2018  ? Thrombocytosis 05/24/2017  ? Iron deficiency anemia 04/23/2017  ? Cough 05/12/2016  ? Pseudophakia of both eyes 10/09/2015  ? Abnormal CBC 02/05/2015  ? Edema 10/15/2014  ? S/P lumbar laminectomy 02/20/2014  ? Pre-ulcerative calluses 02/09/2014  ? Parkinson disease ([place]) 02/09/2014  ? Cold sore 10/17/2013  ? Low back pain 07/25/2013  ? Nuclear sclerosis 02/08/2013  ? Heel ulcer ([place]) 03/08/2012  ? Rash 12/04/2011  ? Neck pain on left side 08/06/2011  ? Presence of unspecified artificial knee joint 04/22/2011  ? Hyperglycemia 03/02/2011  ? Acquired spondylolisthesis 12/25/2010  ? Degeneration of lumbosacral  intervertebral disc 12/25/2010  ? Arthritis of right knee 12/03/2010  ? Knee pain, right 09/08/2010  ? OSA (obstructive sleep apnea) 02/14/2010  ? Upper respiratory infection 01/13/2010  ? BRONCHITIS, ACUTE 04/05/2009  ? Vertigo 12/02/2007  ? Other abnormal glucose 12/02/2007  ? Otitis media 10/26/2007  ? Other malaise and fatigue 05/11/2007  ? Adjustment disorder with mixed anxiety and depressed mood 02/15/2007  ? GERD (gastroesophageal reflux disease) 02/15/2007  ? Osteoarthritis 02/15/2007  ? Postherpetic trigeminal neuralgia 01/26/2007  ? Herpes zoster 01/26/2007  ? Morbid obesity 01/26/2007  ? Migraine headaches 01/26/2007  ? Essential hypertension 01/26/2007  ? Asthma 01/26/2007  ? Urinary incontinence 01/26/2007  ? ? ?REFERRING PROVIDER: Margaretha Sheffield, MD  ?  ?REFERRING DIAG:  ?Lumbar spondylosis, stenosis  ?Lt knee OA ?Parkinson's disease  ? ?THERAPY DIAG:  ?Other abnormalities of gait and mobility ? ?Chronic bilateral low back pain, unspecified whether sciatica present ? ?Chronic pain of left knee ? ?Chronic pain of right knee ? ?Muscle weakness (generalized) ? ?PERTINENT HISTORY:  ?Rt TKA 2013 ?Lumbar laminectomy 2015  ? ?PRECAUTIONS: Fall ? ?SUBJECTIVE: Patient reports she is having more pain in her knees today. She doesn't know why. ? ?PAIN:  ?Are you having pain? Yes ?NPRS scale: 8-9/10 ?Pain location: bilat knees, low back  ?PAIN TYPE: ache, dull, sharp ?Pain description: constant  ?Aggravating factors: weather, standing, walking, transfers ?Relieving factors: medication  ? ?PATIENT GOALS: "stop hurting"  ? ? ?OBJECTIVE:  ?POSTURE:  ?Increased kyphosis, slumped posture in sitting  ?  ?LE MMT: ?  ?MMT Right ?04/30/2021 Left ?04/30/2021  ?Hip flexion 3-/5 3-/5   ?Hip extension      ?Hip abduction      ?Hip adduction      ?Hip internal rotation      ?Hip external rotation      ?Knee flexion 4+/5 4+/5   ?Knee extension 4+/5 4+/5  ?Ankle dorsiflexion 5/5 5/5  ?Ankle plantarflexion      ?Ankle inversion       ?Ankle eversion      ?  ?FUNCTIONAL TESTS:  ?30 sec STS: 0  ?TUG: 47 seconds - 05/12/2021 (63 seconds at eval) ?Sit to supine: Mod A ?Supine to sit: Mod I  ?Sit to stand: Mod A ?Stand to Sit: CGA ? ?2MWT: 160 ft ? ?05/02/2021 - patient able to perform all transfers mod I with extra time required and frequent cueing for technique and effort ? ?05/29/2021: Mod I for all bed mobility ?  ?GAIT: ?Distance walked: 160 ft  ?Assistive device utilized: Environmental consultant - 4 wheeled ?Level of assistance: Supervision ?Comments: shuffling, forward flexed posture, decreased step length  ?  ?  ?  ?TODAY'S TREATMENT  ?[facility] Adult PT Treatment:  DATE: 05/29/2021 ?Therapeutic Exercise: ?NuStep L5 x 6 min with UE/LE while taking subjective ?Sit to stand 3 x 10 - from elevated table with rollator in front, mod cueing for forward trunk lean to unweight bottom to rise ?LAQ with 3# 3 x 15 each ?Seated alternating march with 2# 3 x 20 ?Seated clamshell with green 3 x 20 ?Seated horizontal abduction with yellow 2 x 10 ? ? ?[facility] Adult PT Treatment:                                                DATE: 05/12/2021 ?Therapeutic Exercise: ?NuStep L5 x 5 min with UE/LE while taking subjective ?Sit to stand 2 x 5 - from elevated table with rollator in front, max cueing for forward trunk lean to unweight bottom to rise ?LAQ with 3# 2 x 10 each ?Seated clamshell with green 2 x 15 ?Seated alternating march with 2# 2 x 20 ?Seated row with green 2 x 10 ?2MWT for workload capacity training ? ?[facility] Adult PT Treatment:                                                DATE: 05/08/2021 ?Therapeutic Exercise: ?NuStep L5 x 5 min with UE/LE while taking subjective ?Sit to stand 2 x 5 - from elevated table with rollator in front, max cueing for forward trunk lean to unweight bottom to rise ?LAQ with 2# 2 x 10 each ?Seated alternating march with 2# 2 x 20 ?Seated row with green 2 x 10 ?Sidelying hip abduction 2 x 10 each - partial  range ?SLR x 10 each ?  ?PATIENT EDUCATION:  ?Education details: HEP ?Person educated: Patient; spouse  ?Education method: Explanation ?Education comprehension: Verbalized understanding ?  ?HOME EXERCISE PROGRAM

## 2021-05-29 ENCOUNTER — Ambulatory Visit: Payer: Medicare Other | Attending: Internal Medicine | Admitting: Physical Therapy

## 2021-05-29 ENCOUNTER — Other Ambulatory Visit: Payer: Self-pay

## 2021-05-29 ENCOUNTER — Encounter: Payer: Self-pay | Admitting: Physical Therapy

## 2021-05-29 DIAGNOSIS — G8929 Other chronic pain: Secondary | ICD-10-CM | POA: Insufficient documentation

## 2021-05-29 DIAGNOSIS — R2689 Other abnormalities of gait and mobility: Secondary | ICD-10-CM | POA: Diagnosis not present

## 2021-05-29 DIAGNOSIS — M25562 Pain in left knee: Secondary | ICD-10-CM | POA: Insufficient documentation

## 2021-05-29 DIAGNOSIS — M6281 Muscle weakness (generalized): Secondary | ICD-10-CM | POA: Insufficient documentation

## 2021-05-29 DIAGNOSIS — M25561 Pain in right knee: Secondary | ICD-10-CM | POA: Diagnosis not present

## 2021-05-29 DIAGNOSIS — M545 Low back pain, unspecified: Secondary | ICD-10-CM | POA: Insufficient documentation

## 2021-05-29 NOTE — Patient Instructions (Signed)
Schedule with Megan Salon ?1 visits per week for 4 weeks ? ?Add to waitlist for next week if needed ? ? ?

## 2021-06-09 NOTE — Therapy (Signed)
?OUTPATIENT PHYSICAL THERAPY TREATMENT NOTE ? ? ?Patient Name: Mallory Klein ?MRN: [id number] ?DOB:06/18/1945, 76 y.o., female ?Today's Date: 06/11/2021 ? ?PCP: Plotnikov, Evie Lacks, MD ?REFERRING PROVIDER: Margaretha Sheffield, MD ? ? PT End of Session - 06/11/21 1410   ? ? Visit Number 7   ? Number of Visits 17   ? Date for PT Re-Evaluation 06/28/21   ? Authorization Type UHC MCR   ? Progress Note Due on Visit 10   ? PT Start Time 1405   ? PT Stop Time 3382   ? PT Time Calculation (min) 40 min   ? Activity Tolerance Patient tolerated treatment well   ? Behavior During Therapy [facility] for tasks assessed/performed   ? ?  ?  ? ?  ? ? ? ? ? ? ?Past Medical History:  ?Diagnosis Date  ? Abnormal CBC 02/05/2015  ? [name] 12/16 new: normocytic anemia and thrombocytosis  ? Adjustment disorder with mixed anxiety and depressed mood 02/15/2007  ? Chronic  Chronic pain Grief, stress Effexor XR  Dad died in March 12, 2023  ? Asthma   ? Diverticulosis of colon (without mention of hemorrhage)   ? Esophageal stricture   ? Essential hypertension 01/26/2007  ? Chronic Verapamil   ? Falls   ? "I blackout"  ? GERD (gastroesophageal reflux disease) 02/15/2007  ? Chronic    ? Heart murmur   ? Hyperglycemia 03/02/2011  ? Mild    ? Insomnia disorder 12/08/2018  ? 10/20 Carbid/Lev dose was increased - c/o hard time falling asleep (6 am) getting up at 12 am, poor sleep. Try Temazepam 15-30 mg at 11-1:30 pm   ? Internal hemorrhoids without mention of complication   ? Iron deficiency anemia 04/23/2017  ? Mild neurocognitive disorder, likely due to Parkinson's disease 02/09/2019  ? OSA (obstructive sleep apnea) 02/14/2010  ? In lab study (May 2018): AHI 25. Only had 45 minutes of sleep secondary to frequent awakenings secondary to sleep apnea. autocpap 5-15 cm water.   ? Osteoarthritis   ? Parkinson disease ([place]) Mar 11, 2014  ? 2015 2017  Primidone - d/c 2019 Parkinson's: Sinmet IR. Dr Tat  ? Postherpetic trigeminal neuralgia 01/26/2007  ? Qualifier:  Diagnosis of  By: Reatha Armour, [name]    ? Shortness of breath dyspnea   ? with allergies  ? Thrombocytosis 05/24/2017  ? Urinary incontinence 01/26/2007  ? Chronic  10/17 Detrol LA   ? Vitamin D deficiency 01/26/2019  ? ?Past Surgical History:  ?Procedure Laterality Date  ? ABDOMINAL HYSTERECTOMY    ? BREAST CYST EXCISION Left 1980's  ? LUMBAR LAMINECTOMY/DECOMPRESSION MICRODISCECTOMY Bilateral 02/20/2014  ? Procedure: LUMBAR TWO THREE, LUMBAR THREE FOUR LUMBAR LAMINECTOMY/DECOMPRESSION MICRODISCECTOMY 2 LEVELS;  Surgeon: Eustace Moore, MD;  Location: [facility];  Service: Neurosurgery;  Laterality: Bilateral;  ? SPLENECTOMY    ? TIBIA FRACTURE SURGERY Right   ? TIBIA FRACTURE SURGERY Left   ? TOTAL KNEE ARTHROPLASTY Right 2013  ? ?Patient Active Problem List  ? Diagnosis Date Noted  ? Complete tear of right rotator cuff 08/22/2020  ? Syncope and collapse 08/06/2020  ? Shoulder contusion 07/31/2020  ? Fall 07/31/2020  ? Concussion 07/31/2020  ? Anosmia 05/02/2020  ? Grief reaction 03/27/2020  ? Memory loss 12/13/2019  ? Food poisoning 09/11/2019  ? B12 deficiency 06/14/2019  ? Mild neurocognitive disorder, likely due to Parkinson's disease 02/09/2019  ? Vitamin D deficiency 01/26/2019  ? Sweating profusely 12/08/2018  ? Insomnia disorder 12/08/2018  ? Cerumen impaction  10/19/2018  ? Lumbar facet arthropathy 02/10/2018  ? Thrombocytosis 05/24/2017  ? Iron deficiency anemia 04/23/2017  ? Cough 05/12/2016  ? Pseudophakia of both eyes 10/09/2015  ? Abnormal CBC 02/05/2015  ? Edema 10/15/2014  ? S/P lumbar laminectomy 02/20/2014  ? Pre-ulcerative calluses 02/09/2014  ? Parkinson disease ([place]) 02/09/2014  ? Cold sore 10/17/2013  ? Low back pain 07/25/2013  ? Nuclear sclerosis 02/08/2013  ? Heel ulcer ([place]) 03/08/2012  ? Rash 12/04/2011  ? Neck pain on left side 08/06/2011  ? Presence of unspecified artificial knee joint 04/22/2011  ? Hyperglycemia 03/02/2011  ? Acquired spondylolisthesis 12/25/2010  ? Degeneration of  lumbosacral intervertebral disc 12/25/2010  ? Arthritis of right knee 12/03/2010  ? Knee pain, right 09/08/2010  ? OSA (obstructive sleep apnea) 02/14/2010  ? Upper respiratory infection 01/13/2010  ? BRONCHITIS, ACUTE 04/05/2009  ? Vertigo 12/02/2007  ? Other abnormal glucose 12/02/2007  ? Otitis media 10/26/2007  ? Other malaise and fatigue 05/11/2007  ? Adjustment disorder with mixed anxiety and depressed mood 02/15/2007  ? GERD (gastroesophageal reflux disease) 02/15/2007  ? Osteoarthritis 02/15/2007  ? Postherpetic trigeminal neuralgia 01/26/2007  ? Herpes zoster 01/26/2007  ? Morbid obesity 01/26/2007  ? Migraine headaches 01/26/2007  ? Essential hypertension 01/26/2007  ? Asthma 01/26/2007  ? Urinary incontinence 01/26/2007  ? ? ?REFERRING PROVIDER: Margaretha Sheffield, MD  ?  ?REFERRING DIAG:  ?Lumbar spondylosis, stenosis  ?Lt knee OA ?Parkinson's disease  ? ?THERAPY DIAG:  ?Other abnormalities of gait and mobility ? ?Chronic bilateral low back pain, unspecified whether sciatica present ? ?Chronic pain of left knee ? ?Chronic pain of right knee ? ?Muscle weakness (generalized) ? ?PERTINENT HISTORY:  ?Rt TKA 2013 ?Lumbar laminectomy 2015  ? ?PRECAUTIONS: Fall ? ?SUBJECTIVE: Patient reports feeling much better this visit compared to last visit. Pain is mainly in the knees. She notes she did not have to take a pain pill today.  ? ?PAIN:  ?Are you having pain? Yes ?NPRS scale: 4/10 ?Pain location: bilat knees, low back  ?PAIN TYPE: ache, dull, sharp ?Pain description: constant  ?Aggravating factors: weather, standing, walking, transfers ?Relieving factors: medication  ? ?PATIENT GOALS: "stop hurting"  ? ? ?OBJECTIVE:  ?POSTURE:  ?Increased kyphosis, slumped posture in sitting  ?  ?LE MMT: ?  ?MMT Right ?04/30/2021 Left ?04/30/2021  ?Hip flexion 3-/5 3-/5   ?Hip extension      ?Hip abduction      ?Hip adduction      ?Hip internal rotation      ?Hip external rotation      ?Knee flexion 4+/5 4+/5   ?Knee extension 4+/5  4+/5  ?Ankle dorsiflexion 5/5 5/5  ?Ankle plantarflexion      ?Ankle inversion      ?Ankle eversion      ?  ?FUNCTIONAL TESTS:  ?30 sec STS: 0  ?TUG: 47 seconds - 05/12/2021 (63 seconds at eval) ?Sit to supine: Mod A ?Supine to sit: Mod I  ?Sit to stand: Mod A ?Stand to Sit: CGA ? ?2MWT: 160 ft ? ?05/02/2021 - patient able to perform all transfers mod I with extra time required and frequent cueing for technique and effort ? ?05/29/2021: Mod I for all bed mobility ?  ?GAIT: ?Distance walked: 160 ft  ?Assistive device utilized: Environmental consultant - 4 wheeled ?Level of assistance: Supervision ?Comments: shuffling, forward flexed posture, decreased step length  ?  ?  ?TODAY'S TREATMENT  ?[facility] Adult PT Treatment:  DATE: 06/11/2021 ?Therapeutic Exercise: ?NuStep L5 x 6 min with UE/LE while taking subjective ?Supine PPT 2 x 10 - cued for bridge ?SLR 2 x 10 each ?SAQ with 3# 2 x 15 each ?LAQ with 3# 2 x 15 each ?Sit to stand 2 x 10 - from standard chair height, patient cued to rock forward to get nose over toes ?Seated row with green 2 x 15 ?Standing hip abduction 2 x 10 each ?Standing heel raises 2 x 10 ?Standing alternating march 2 x 20 ? ? ?[facility] Adult PT Treatment:                                                DATE: 05/29/2021 ?Therapeutic Exercise: ?NuStep L5 x 6 min with UE/LE while taking subjective ?Sit to stand 3 x 10 - from elevated table with rollator in front, mod cueing for forward trunk lean to unweight bottom to rise ?LAQ with 3# 3 x 15 each ?Seated alternating march with 2# 3 x 20 ?Seated clamshell with green 3 x 20 ?Seated horizontal abduction with yellow 2 x 10 ? ?[facility] Adult PT Treatment:                                                DATE: 05/12/2021 ?Therapeutic Exercise: ?NuStep L5 x 5 min with UE/LE while taking subjective ?Sit to stand 2 x 5 - from elevated table with rollator in front, max cueing for forward trunk lean to unweight bottom to rise ?LAQ with 3# 2 x 10  each ?Seated clamshell with green 2 x 15 ?Seated alternating march with 2# 2 x 20 ?Seated row with green 2 x 10 ?2MWT for workload capacity training ?  ?PATIENT EDUCATION:  ?Education details: HEP ?Person educated: Patient;

## 2021-06-11 ENCOUNTER — Other Ambulatory Visit: Payer: Self-pay

## 2021-06-11 ENCOUNTER — Ambulatory Visit: Payer: Medicare Other | Admitting: Physical Therapy

## 2021-06-11 ENCOUNTER — Encounter: Payer: Self-pay | Admitting: Physical Therapy

## 2021-06-11 DIAGNOSIS — G8929 Other chronic pain: Secondary | ICD-10-CM

## 2021-06-11 DIAGNOSIS — R2689 Other abnormalities of gait and mobility: Secondary | ICD-10-CM | POA: Diagnosis not present

## 2021-06-11 DIAGNOSIS — M25562 Pain in left knee: Secondary | ICD-10-CM | POA: Diagnosis not present

## 2021-06-11 DIAGNOSIS — M6281 Muscle weakness (generalized): Secondary | ICD-10-CM | POA: Diagnosis not present

## 2021-06-11 DIAGNOSIS — M25561 Pain in right knee: Secondary | ICD-10-CM | POA: Diagnosis not present

## 2021-06-11 DIAGNOSIS — M545 Low back pain, unspecified: Secondary | ICD-10-CM

## 2021-06-16 ENCOUNTER — Telehealth: Payer: Self-pay | Admitting: Internal Medicine

## 2021-06-16 NOTE — Telephone Encounter (Signed)
Pt states her feet are swollen  ? ?Pt states she has been taken  furosemide (LASIX) 20 MG tablet as prescribed ? ?Pts next appt is 4-27, pt states she can not wait that long ? ?Advised pt provider does not have availability prior to then ? ?Pt requesting a cb ?

## 2021-06-17 DIAGNOSIS — M47816 Spondylosis without myelopathy or radiculopathy, lumbar region: Secondary | ICD-10-CM | POA: Diagnosis not present

## 2021-06-17 DIAGNOSIS — M1712 Unilateral primary osteoarthritis, left knee: Secondary | ICD-10-CM | POA: Diagnosis not present

## 2021-06-17 DIAGNOSIS — G894 Chronic pain syndrome: Secondary | ICD-10-CM | POA: Diagnosis not present

## 2021-06-17 DIAGNOSIS — M48061 Spinal stenosis, lumbar region without neurogenic claudication: Secondary | ICD-10-CM | POA: Diagnosis not present

## 2021-06-17 NOTE — Therapy (Signed)
?OUTPATIENT PHYSICAL THERAPY TREATMENT NOTE ? ? ?Patient Name: Mallory Klein ?MRN: [id number] ?DOB:Apr 21, 1945, 76 y.o., female ?Today's Date: 06/18/2021 ? ?PCP: Plotnikov, Evie Lacks, MD ?REFERRING PROVIDER: Margaretha Sheffield, MD ? ? PT End of Session - 06/18/21 1418   ? ? Visit Number 8   ? Number of Visits 17   ? Date for PT Re-Evaluation 06/28/21   ? Authorization Type [employer] MCR   ? Progress Note Due on Visit 10   ? PT Start Time 1412   ? PT Stop Time 6503   ? PT Time Calculation (min) 33 min   ? Activity Tolerance Patient tolerated treatment well   ? Behavior During Therapy [facility] for tasks assessed/performed   ? ?  ?  ? ?  ? ? ? ? ? ? ? ?Past Medical History:  ?Diagnosis Date  ? Abnormal CBC 02/05/2015  ? [name] 12/16 new: normocytic anemia and thrombocytosis  ? Adjustment disorder with mixed anxiety and depressed mood 02/15/2007  ? Chronic  Chronic pain Grief, stress Effexor XR  Dad died in Feb 22, 2023  ? Asthma   ? Diverticulosis of colon (without mention of hemorrhage)   ? Esophageal stricture   ? Essential hypertension 01/26/2007  ? Chronic Verapamil   ? Falls   ? "I blackout"  ? GERD (gastroesophageal reflux disease) 02/15/2007  ? Chronic    ? Heart murmur   ? Hyperglycemia 03/02/2011  ? Mild    ? Insomnia disorder 12/08/2018  ? 10/20 Carbid/Lev dose was increased - c/o hard time falling asleep (6 am) getting up at 12 am, poor sleep. Try Temazepam 15-30 mg at 11-1:30 pm   ? Internal hemorrhoids without mention of complication   ? Iron deficiency anemia 04/23/2017  ? Mild neurocognitive disorder, likely due to Parkinson's disease 02/09/2019  ? OSA (obstructive sleep apnea) 02/14/2010  ? In lab study (May 2018): AHI 25. Only had 45 minutes of sleep secondary to frequent awakenings secondary to sleep apnea. autocpap 5-15 cm water.   ? Osteoarthritis   ? Parkinson disease ([place]) 02-21-2014  ? 2015 2017  Primidone - d/c 2019 Parkinson's: [facility] IR. Dr Tat  ? Postherpetic trigeminal neuralgia 01/26/2007  ? Qualifier:  Diagnosis of  By: Reatha Armour, [name]    ? Shortness of breath dyspnea   ? with allergies  ? Thrombocytosis 05/24/2017  ? Urinary incontinence 01/26/2007  ? Chronic  10/17 Detrol LA   ? Vitamin D deficiency 01/26/2019  ? ?Past Surgical History:  ?Procedure Laterality Date  ? ABDOMINAL HYSTERECTOMY    ? BREAST CYST EXCISION Left 1980's  ? LUMBAR LAMINECTOMY/DECOMPRESSION MICRODISCECTOMY Bilateral 02/20/2014  ? Procedure: LUMBAR TWO THREE, LUMBAR THREE FOUR LUMBAR LAMINECTOMY/DECOMPRESSION MICRODISCECTOMY 2 LEVELS;  Surgeon: Eustace Moore, MD;  Location: [facility];  Service: Neurosurgery;  Laterality: Bilateral;  ? SPLENECTOMY    ? TIBIA FRACTURE SURGERY Right   ? TIBIA FRACTURE SURGERY Left   ? TOTAL KNEE ARTHROPLASTY Right 2013  ? ?Patient Active Problem List  ? Diagnosis Date Noted  ? Complete tear of right rotator cuff 08/22/2020  ? Syncope and collapse 08/06/2020  ? Shoulder contusion 07/31/2020  ? Fall 07/31/2020  ? Concussion 07/31/2020  ? Anosmia 05/02/2020  ? Grief reaction 03/27/2020  ? Memory loss 12/13/2019  ? Food poisoning 09/11/2019  ? B12 deficiency 06/14/2019  ? Mild neurocognitive disorder, likely due to Parkinson's disease 02/09/2019  ? Vitamin D deficiency 01/26/2019  ? Sweating profusely 12/08/2018  ? Insomnia disorder 12/08/2018  ? Cerumen  impaction 10/19/2018  ? Lumbar facet arthropathy 02/10/2018  ? Thrombocytosis 05/24/2017  ? Iron deficiency anemia 04/23/2017  ? Cough 05/12/2016  ? Pseudophakia of both eyes 10/09/2015  ? Abnormal CBC 02/05/2015  ? Edema 10/15/2014  ? S/P lumbar laminectomy 02/20/2014  ? Pre-ulcerative calluses 02/09/2014  ? Parkinson disease ([place]) 02/09/2014  ? Cold sore 10/17/2013  ? Low back pain 07/25/2013  ? Nuclear sclerosis 02/08/2013  ? Heel ulcer ([place]) 03/08/2012  ? Rash 12/04/2011  ? Neck pain on left side 08/06/2011  ? Presence of unspecified artificial knee joint 04/22/2011  ? Hyperglycemia 03/02/2011  ? Acquired spondylolisthesis 12/25/2010  ? Degeneration of  lumbosacral intervertebral disc 12/25/2010  ? Arthritis of right knee 12/03/2010  ? Knee pain, right 09/08/2010  ? OSA (obstructive sleep apnea) 02/14/2010  ? Upper respiratory infection 01/13/2010  ? BRONCHITIS, ACUTE 04/05/2009  ? Vertigo 12/02/2007  ? Other abnormal glucose 12/02/2007  ? Otitis media 10/26/2007  ? Other malaise and fatigue 05/11/2007  ? Adjustment disorder with mixed anxiety and depressed mood 02/15/2007  ? GERD (gastroesophageal reflux disease) 02/15/2007  ? Osteoarthritis 02/15/2007  ? Postherpetic trigeminal neuralgia 01/26/2007  ? Herpes zoster 01/26/2007  ? Morbid obesity 01/26/2007  ? Migraine headaches 01/26/2007  ? Essential hypertension 01/26/2007  ? Asthma 01/26/2007  ? Urinary incontinence 01/26/2007  ? ? ?REFERRING PROVIDER: Margaretha Sheffield, MD  ?  ?REFERRING DIAG:  ?Lumbar spondylosis, stenosis  ?Lt knee OA ?Parkinson's disease  ? ?THERAPY DIAG:  ?Other abnormalities of gait and mobility ? ?Chronic bilateral low back pain, unspecified whether sciatica present ? ?Chronic pain of left knee ? ?Chronic pain of right knee ? ?Muscle weakness (generalized) ? ?PERTINENT HISTORY:  ?Rt TKA 2013 ?Lumbar laminectomy 2015  ? ?PRECAUTIONS: Fall ? ?SUBJECTIVE: Patient reports she is doing well, yesterday she was hurting though. Her knees give her the most trouble. ? ?PAIN:  ?Are you having pain? Yes ?NPRS scale: 1-2/10 ?Pain location: bilat knees, low back  ?PAIN TYPE: ache, dull, sharp ?Pain description: constant  ?Aggravating factors: weather, standing, walking, transfers ?Relieving factors: medication  ? ?PATIENT GOALS: "stop hurting"  ? ? ?OBJECTIVE:  ?POSTURE:  ?Increased kyphosis, slumped posture in sitting  ?  ?LE MMT: ?  ?MMT Right ?04/30/2021 Left ?04/30/2021  ?Hip flexion 3-/5 3-/5   ?Hip extension      ?Hip abduction      ?Hip adduction      ?Hip internal rotation      ?Hip external rotation      ?Knee flexion 4+/5 4+/5   ?Knee extension 4+/5 4+/5  ?Ankle dorsiflexion 5/5 5/5  ?Ankle  plantarflexion      ?Ankle inversion      ?Ankle eversion      ?  ?FUNCTIONAL TESTS:  ?30 sec STS: 0  ?TUG: 25 seconds - 06/18/2021 (47 seconds - 05/12/2021; 63 seconds at eval) ?Sit to supine: Mod A ?Supine to sit: Mod I  ?Sit to stand: Mod A ?Stand to Sit: CGA ? ?2MWT: 160 ft ? ?05/02/2021 - patient able to perform all transfers mod I with extra time required and frequent cueing for technique and effort ? ?05/29/2021: Mod I for all bed mobility ?  ?GAIT: ?Distance walked: 160 ft  ?Assistive device utilized: Environmental consultant - 4 wheeled ?Level of assistance: Supervision ?Comments: shuffling, forward flexed posture, decreased step length  ?  ?  ?TODAY'S TREATMENT  ?[facility] Adult PT Treatment:  DATE: 06/18/2021 ?Therapeutic Exercise: ?NuStep L5 x 6 min with UE/LE while taking subjective ?Sit to stand 3 x 10 - from standard chair height, patient cued to rock forward to get nose over toes ?LAQ with 4# 2 x 15 each ?Standing hip abduction with 4# 2 x 10 each ?Standing heel raises with 4# 2 x 15 ?Standing alternating march 2 x 20 ? ? ?[facility] Adult PT Treatment:                                                DATE: 06/11/2021 ?Therapeutic Exercise: ?NuStep L5 x 6 min with UE/LE while taking subjective ?Supine PPT 2 x 10 - cued for bridge ?SLR 2 x 10 each ?SAQ with 3# 2 x 15 each ?LAQ with 3# 2 x 15 each ?Sit to stand 2 x 10 - from standard chair height, patient cued to rock forward to get nose over toes ?Seated row with green 2 x 15 ?Standing hip abduction 2 x 10 each ?Standing heel raises 2 x 10 ?Standing alternating march 2 x 20 ? ?[facility] Adult PT Treatment:                                                DATE: 05/29/2021 ?Therapeutic Exercise: ?NuStep L5 x 6 min with UE/LE while taking subjective ?Sit to stand 3 x 10 - from elevated table with rollator in front, mod cueing for forward trunk lean to unweight bottom to rise ?LAQ with 3# 3 x 15 each ?Seated alternating march with 2# 3 x 20 ?Seated  clamshell with green 3 x 20 ?Seated horizontal abduction with yellow 2 x 10 ?  ?PATIENT EDUCATION:  ?Education details: HEP ?Person educated: Patient; spouse  ?Education method: Explanation ?Education comprehension: Ron Parker

## 2021-06-17 NOTE — Telephone Encounter (Signed)
Try to increase furosemide to 40 mg.  Thanks ?

## 2021-06-17 NOTE — Telephone Encounter (Signed)
Notified pt w/MD response../l,mb 

## 2021-06-18 ENCOUNTER — Ambulatory Visit: Payer: Medicare Other | Admitting: Physical Therapy

## 2021-06-18 ENCOUNTER — Encounter: Payer: Self-pay | Admitting: Physical Therapy

## 2021-06-18 ENCOUNTER — Other Ambulatory Visit: Payer: Self-pay

## 2021-06-18 DIAGNOSIS — M25562 Pain in left knee: Secondary | ICD-10-CM | POA: Diagnosis not present

## 2021-06-18 DIAGNOSIS — R2689 Other abnormalities of gait and mobility: Secondary | ICD-10-CM | POA: Diagnosis not present

## 2021-06-18 DIAGNOSIS — M545 Low back pain, unspecified: Secondary | ICD-10-CM

## 2021-06-18 DIAGNOSIS — M6281 Muscle weakness (generalized): Secondary | ICD-10-CM

## 2021-06-18 DIAGNOSIS — M25561 Pain in right knee: Secondary | ICD-10-CM | POA: Diagnosis not present

## 2021-06-18 DIAGNOSIS — G8929 Other chronic pain: Secondary | ICD-10-CM | POA: Diagnosis not present

## 2021-06-19 ENCOUNTER — Ambulatory Visit (INDEPENDENT_AMBULATORY_CARE_PROVIDER_SITE_OTHER): Payer: Medicare Other | Admitting: Internal Medicine

## 2021-06-19 DIAGNOSIS — R609 Edema, unspecified: Secondary | ICD-10-CM | POA: Diagnosis not present

## 2021-06-19 DIAGNOSIS — M159 Polyosteoarthritis, unspecified: Secondary | ICD-10-CM

## 2021-06-19 DIAGNOSIS — F4323 Adjustment disorder with mixed anxiety and depressed mood: Secondary | ICD-10-CM

## 2021-06-19 DIAGNOSIS — F19982 Other psychoactive substance use, unspecified with psychoactive substance-induced sleep disorder: Secondary | ICD-10-CM

## 2021-06-19 DIAGNOSIS — R32 Unspecified urinary incontinence: Secondary | ICD-10-CM | POA: Diagnosis not present

## 2021-06-19 MED ORDER — OXYBUTYNIN CHLORIDE 5 MG PO TABS: 5.0000 mg | ORAL_TABLET | Freq: Three times a day (TID) | ORAL | 3 refills | Status: DC | PRN

## 2021-06-19 MED ORDER — QUETIAPINE FUMARATE 100 MG PO TABS: 100.0000 mg | ORAL_TABLET | Freq: Every day | ORAL | 3 refills | Status: DC

## 2021-06-19 NOTE — Assessment & Plan Note (Signed)
Try CBD gummies ?

## 2021-06-19 NOTE — Assessment & Plan Note (Signed)
Try compression socks with zipper ?Use the wedge to elevate feet ?

## 2021-06-19 NOTE — Assessment & Plan Note (Signed)
Blue-Emu cream was recommended to use 2-3 times a day ? ?

## 2021-06-19 NOTE — Assessment & Plan Note (Signed)
Try Ditropan prn ?

## 2021-06-19 NOTE — Patient Instructions (Addendum)
Try compression socks with zipper ?Use the wedge to elevate feet ?Blue-Emu cream use 2-3 times a day ? ?

## 2021-06-19 NOTE — Progress Notes (Signed)
? ?Subjective:  ?Patient ID: Mallory Klein, female    DOB: 1945-06-08  Age: 76 y.o. MRN: [id number] ? ?CC: Follow-up (Swelling in both legs and feet (bilateral)) ? ? ?HPI ?Mallory Klein presents for leg swelling - worse ?Lasix works well - too well, she is complaining of urinary urgency and incontinence, aggravated by diuretics ?C/o insomnia - worse ?F/u OA/LBP ?She is here with her husband Doc who helps with history ? ?Outpatient Medications Prior to Visit  ?Medication Sig Dispense Refill  ? buprenorphine (BUTRANS) 7.5 MCG/HR 1 patch once a week.    ? celecoxib (CELEBREX) 200 MG capsule Take 1 capsule (200 mg total) by mouth 2 (two) times daily as needed for moderate pain. 60 capsule 3  ? cetirizine (ZYRTEC) 10 MG tablet TAKE 1 TABLET BY MOUTH EVERY DAY 90 tablet 3  ? Cholecalciferol 25 MCG (1000 UT) tablet Take by mouth.    ? [employer] SENNA PLUS 8.6-50 MG tablet Take 1 tablet by mouth 2 (two) times daily.    ? cyanocobalamin (,VITAMIN B-12,) 1000 MCG/ML injection INJECT 1 ML INTO THE SKIN EVERY 14 DAYS 10 mL 3  ? diclofenac Sodium (VOLTAREN) 1 % GEL Apply 4 g topically daily as needed (pain).    ? docusate sodium (COLACE) 100 MG capsule Take 100 mg by mouth at bedtime.    ? furosemide (LASIX) 20 MG tablet Take 1-2 tablets (20-40 mg total) by mouth daily as needed. 60 tablet 3  ? hydrochlorothiazide (MICROZIDE) 12.5 MG capsule Take 12.5 mg by mouth daily.    ? hydrOXYzine (VISTARIL) 50 MG capsule Take 1-2 capsules (50-100 mg total) by mouth 3 (three) times daily as needed (sleep). 60 capsule 5  ? montelukast (SINGULAIR) 10 MG tablet TAKE 1 TABLET BY MOUTH  DAILY 90 tablet 3  ? OVER THE COUNTER MEDICATION Take 1 tablet by mouth at bedtime. Lions mane extract    ? pantoprazole (PROTONIX) 40 MG tablet TAKE 1 TABLET BY MOUTH  DAILY 90 tablet 3  ? polyethylene glycol (MIRALAX / GLYCOLAX) 17 g packet Take 17 g by mouth daily as needed for mild constipation.    ? pramipexole (MIRAPEX) 0.75 MG tablet Take 1 tablet (0.75  mg total) by mouth 3 (three) times daily. 270 tablet 3  ? promethazine-dextromethorphan (PROMETHAZINE-DM) 6.25-15 MG/5ML syrup Take 5 mLs by mouth 4 (four) times daily as needed for cough. 240 mL 0  ? rosuvastatin (CRESTOR) 10 MG tablet Take 1 tablet (10 mg total) by mouth daily. 90 tablet 3  ? verapamil (CALAN-SR) 180 MG CR tablet Take 1 tablet (180 mg total) by mouth at bedtime. 90 tablet 3  ? buprenorphine (BUTRANS) 10 MCG/HR PTWK 1 patch once a week. (Patient not taking: Reported on 04/09/2021)    ? ?No facility-administered medications prior to visit.  ? ? ?ROS: ?Review of Systems  ?Constitutional:  Positive for fatigue. Negative for activity change, appetite change, chills and unexpected weight change.  ?HENT:  Negative for congestion, mouth sores and sinus pressure.   ?Eyes:  Negative for visual disturbance.  ?Respiratory:  Negative for cough and chest tightness.   ?Cardiovascular:  Positive for leg swelling.  ?Gastrointestinal:  Negative for abdominal pain and nausea.  ?Genitourinary:  Negative for difficulty urinating, frequency and vaginal pain.  ?Musculoskeletal:  Positive for arthralgias, back pain and gait problem.  ?Skin:  Negative for pallor and rash.  ?Neurological:  Negative for dizziness, tremors, weakness, numbness and headaches.  ?Psychiatric/Behavioral:  Negative for confusion, decreased concentration and  sleep disturbance. The patient is nervous/anxious.   ? ?Objective:  ?BP 138/78 (BP Location: Left Arm, Patient Position: Sitting, Cuff Size: Large)   Pulse 94   Temp 98.1 ?F (36.7 ?C) (Oral)   Ht 5' 1.5" (1.562 m)   Wt 173 lb 6.4 oz (78.7 kg)   SpO2 96%   BMI 32.23 kg/m?  ? ?BP Readings from Last 3 Encounters:  ?06/19/21 138/78  ?05/14/21 134/76  ?05/14/21 134/76  ? ? ?Wt Readings from Last 3 Encounters:  ?06/19/21 173 lb 6.4 oz (78.7 kg)  ?05/14/21 176 lb 5.9 oz (80 kg)  ?05/14/21 176 lb 6 oz (80 kg)  ? ? ?Physical Exam ?Constitutional:   ?   General: She is not in acute distress. ?    Appearance: She is well-developed. She is obese.  ?HENT:  ?   Head: Normocephalic.  ?   Right Ear: External ear normal.  ?   Left Ear: External ear normal.  ?   Nose: Nose normal.  ?Eyes:  ?   General:     ?   Right eye: No discharge.     ?   Left eye: No discharge.  ?   Conjunctiva/sclera: Conjunctivae normal.  ?   Pupils: Pupils are equal, round, and reactive to light.  ?Neck:  ?   Thyroid: No thyromegaly.  ?   Vascular: No JVD.  ?   Trachea: No tracheal deviation.  ?Cardiovascular:  ?   Rate and Rhythm: Normal rate and regular rhythm.  ?   Heart sounds: Normal heart sounds.  ?Pulmonary:  ?   Effort: No respiratory distress.  ?   Breath sounds: No stridor. No wheezing or rhonchi.  ?Abdominal:  ?   General: Bowel sounds are normal. There is no distension.  ?   Palpations: Abdomen is soft. There is no mass.  ?   Tenderness: There is no abdominal tenderness. There is no guarding or rebound.  ?Musculoskeletal:     ?   General: Tenderness present.  ?   Cervical back: Normal range of motion and neck supple. No rigidity.  ?   Right lower leg: Edema present.  ?   Left lower leg: Edema present.  ?Lymphadenopathy:  ?   Cervical: No cervical adenopathy.  ?Skin: ?   Findings: No erythema or rash.  ?Neurological:  ?   Mental Status: She is oriented to person, place, and time.  ?   Cranial Nerves: No cranial nerve deficit.  ?   Motor: Weakness present. No abnormal muscle tone.  ?   Coordination: Coordination abnormal.  ?   Gait: Gait abnormal.  ?   Deep Tendon Reflexes: Reflexes normal.  ?Psychiatric:     ?   Behavior: Behavior normal.     ?   Thought Content: Thought content normal.     ?   Judgment: Judgment normal.  ?Trace to 1+ edema B ? ?Lab Results  ?Component Value Date  ? WBC 8.1 02/21/2021  ? HGB 12.4 02/21/2021  ? HCT 38.9 02/21/2021  ? PLT 289 02/21/2021  ? GLUCOSE 92 05/14/2021  ? CHOL 200 (H) 01/07/2021  ? TRIG 53 01/07/2021  ? HDL 79 01/07/2021  ? LDLDIRECT 109.8 02/10/2010  ? LDLCALC 111 (H) 01/07/2021  ? ALT 6  05/14/2021  ? AST 14 05/14/2021  ? NA 143 05/14/2021  ? K 3.9 05/14/2021  ? CL 104 05/14/2021  ? CREATININE 0.95 05/14/2021  ? BUN 12 05/14/2021  ? CO2 34 (H) 05/14/2021  ?  TSH 0.67 06/25/2020  ? INR 0.97 02/07/2014  ? HGBA1C 5.8 05/14/2021  ? ? ?VAS [place] LOWER EXTREMITY VENOUS (DVT) ? ?Result Date: 03/17/2021 ? Lower Venous DVT Study Patient Name:  L[place]  Date of Exam:   03/17/2021 Medical Rec #: [id number]          Accession #:    [id number] Date of Birth: 1945-08-16         Patient Gender: F Patient Age:   74 years Exam Location:  [facility] Procedure:      VAS [place] LOWER EXTREMITY VENOUS (DVT) Referring Phys: Cathlean Cower --------------------------------------------------------------------------------  Indications: Patient denies any shortness of breath or chest pain. Patient states swelling in both legs from knee down for the past two months.  Risk Factors: Surgery Total right knee replacement 2013. Comparison Study: NA Performing Technologist: Leavy Cella RDCS  Examination Guidelines: A complete evaluation includes B-mode imaging, spectral Doppler, color Doppler, and power Doppler as needed of all accessible portions of each vessel. Bilateral testing is considered an integral part of a complete examination. Limited examinations for reoccurring indications may be performed as noted. The reflux portion of the exam is performed with the patient in reverse Trendelenburg.  +---------+---------------+---------+-----------+----------+--------------+ RIGHT    CompressibilityPhasicitySpontaneityPropertiesThrombus Aging +---------+---------------+---------+-----------+----------+--------------+ CFV      Full           Yes      Yes                                 +---------+---------------+---------+-----------+----------+--------------+ SFJ      Full           Yes      Yes                                 +---------+---------------+---------+-----------+----------+--------------+ FV Prox   Full           Yes      Yes                                 +---------+---------------+---------+-----------+----------+--------------+ FV Mid   Full           Yes      Yes                                 +---------+---------------+---

## 2021-06-20 ENCOUNTER — Encounter: Payer: Self-pay | Admitting: Internal Medicine

## 2021-06-24 NOTE — Therapy (Signed)
?OUTPATIENT PHYSICAL THERAPY TREATMENT NOTE ? ? ?Patient Name: Mallory Klein ?MRN: [id number] ?DOB:06-24-1945, 76 y.o., female ?Today's Date: 06/25/2021 ? ?PCP: Plotnikov, Evie Lacks, MD ?REFERRING PROVIDER: Margaretha Sheffield, MD ? ? PT End of Session - 06/25/21 1629   ? ? Visit Number 9   ? Number of Visits 13   ? Date for PT Re-Evaluation 07/23/21   ? Authorization Type UHC MCR   ? Progress Note Due on Visit 10   ? PT Start Time 1622   ? PT Stop Time 1700   ? PT Time Calculation (min) 38 min   ? Activity Tolerance Patient tolerated treatment well   ? Behavior During Therapy [facility] for tasks assessed/performed   ? ?  ?  ? ?  ? ? ? ? ? ? ? ? ?Past Medical History:  ?Diagnosis Date  ? Abnormal CBC 02/05/2015  ? [name] 12/16 new: normocytic anemia and thrombocytosis  ? Adjustment disorder with mixed anxiety and depressed mood 02/15/2007  ? Chronic  Chronic pain Grief, stress Effexor XR  Dad died in 02-16-2023  ? Asthma   ? Diverticulosis of colon (without mention of hemorrhage)   ? Esophageal stricture   ? Essential hypertension 01/26/2007  ? Chronic Verapamil   ? Falls   ? "I blackout"  ? GERD (gastroesophageal reflux disease) 02/15/2007  ? Chronic    ? Heart murmur   ? Hyperglycemia 03/02/2011  ? Mild    ? Insomnia disorder 12/08/2018  ? 10/20 Carbid/Lev dose was increased - c/o hard time falling asleep (6 am) getting up at 12 am, poor sleep. Try Temazepam 15-30 mg at 11-1:30 pm   ? Internal hemorrhoids without mention of complication   ? Iron deficiency anemia 04/23/2017  ? Mild neurocognitive disorder, likely due to Parkinson's disease 02/09/2019  ? OSA (obstructive sleep apnea) 02/14/2010  ? In lab study (May 2018): AHI 25. Only had 45 minutes of sleep secondary to frequent awakenings secondary to sleep apnea. autocpap 5-15 cm water.   ? Osteoarthritis   ? Parkinson disease ([place]) 02-15-2014  ? 2015 2017  Primidone - d/c 2019 Parkinson's: Sinmet IR. Dr Tat  ? Postherpetic trigeminal neuralgia 01/26/2007  ? Qualifier:  Diagnosis of  By: Reatha Armour, [name]    ? Shortness of breath dyspnea   ? with allergies  ? Thrombocytosis 05/24/2017  ? Urinary incontinence 01/26/2007  ? Chronic  10/17 Detrol LA   ? Vitamin D deficiency 01/26/2019  ? ?Past Surgical History:  ?Procedure Laterality Date  ? ABDOMINAL HYSTERECTOMY    ? BREAST CYST EXCISION Left 1980's  ? LUMBAR LAMINECTOMY/DECOMPRESSION MICRODISCECTOMY Bilateral 02/20/2014  ? Procedure: LUMBAR TWO THREE, LUMBAR THREE FOUR LUMBAR LAMINECTOMY/DECOMPRESSION MICRODISCECTOMY 2 LEVELS;  Surgeon: Eustace Moore, MD;  Location: [place] [facility];  Service: Neurosurgery;  Laterality: Bilateral;  ? SPLENECTOMY    ? TIBIA FRACTURE SURGERY Right   ? TIBIA FRACTURE SURGERY Left   ? TOTAL KNEE ARTHROPLASTY Right 2013  ? ?Patient Active Problem List  ? Diagnosis Date Noted  ? Complete tear of right rotator cuff 08/22/2020  ? Syncope and collapse 08/06/2020  ? Shoulder contusion 07/31/2020  ? Fall 07/31/2020  ? Concussion 07/31/2020  ? Anosmia 05/02/2020  ? Grief reaction 03/27/2020  ? Memory loss 12/13/2019  ? Food poisoning 09/11/2019  ? B12 deficiency 06/14/2019  ? Mild neurocognitive disorder, likely due to Parkinson's disease 02/09/2019  ? Vitamin D deficiency 01/26/2019  ? Sweating profusely 12/08/2018  ? Insomnia disorder 12/08/2018  ?  Cerumen impaction 10/19/2018  ? Lumbar facet arthropathy 02/10/2018  ? Thrombocytosis 05/24/2017  ? Iron deficiency anemia 04/23/2017  ? Cough 05/12/2016  ? Pseudophakia of both eyes 10/09/2015  ? Abnormal CBC 02/05/2015  ? Edema 10/15/2014  ? S/P lumbar laminectomy 02/20/2014  ? Pre-ulcerative calluses 02/09/2014  ? Parkinson disease ([place]) 02/09/2014  ? Cold sore 10/17/2013  ? Low back pain 07/25/2013  ? Nuclear sclerosis 02/08/2013  ? Heel ulcer ([place]) 03/08/2012  ? Rash 12/04/2011  ? Neck pain on left side 08/06/2011  ? Presence of unspecified artificial knee joint 04/22/2011  ? Hyperglycemia 03/02/2011  ? Acquired spondylolisthesis 12/25/2010  ? Degeneration of  lumbosacral intervertebral disc 12/25/2010  ? Arthritis of right knee 12/03/2010  ? Knee pain, right 09/08/2010  ? OSA (obstructive sleep apnea) 02/14/2010  ? Upper respiratory infection 01/13/2010  ? BRONCHITIS, ACUTE 04/05/2009  ? Vertigo 12/02/2007  ? Other abnormal glucose 12/02/2007  ? Otitis media 10/26/2007  ? Other malaise and fatigue 05/11/2007  ? Adjustment disorder with mixed anxiety and depressed mood 02/15/2007  ? GERD (gastroesophageal reflux disease) 02/15/2007  ? Osteoarthritis 02/15/2007  ? Postherpetic trigeminal neuralgia 01/26/2007  ? Herpes zoster 01/26/2007  ? Morbid obesity 01/26/2007  ? Migraine headaches 01/26/2007  ? Essential hypertension 01/26/2007  ? Asthma 01/26/2007  ? Urinary incontinence 01/26/2007  ? ? ?REFERRING PROVIDER: Margaretha Sheffield, MD  ?  ?REFERRING DIAG:  ?Lumbar spondylosis, stenosis  ?Lt knee OA ?Parkinson's disease  ? ?THERAPY DIAG:  ?Other abnormalities of gait and mobility ? ?Chronic bilateral low back pain, unspecified whether sciatica present ? ?Chronic pain of left knee ? ?Chronic pain of right knee ? ?Muscle weakness (generalized) ? ?PERTINENT HISTORY:  ?Rt TKA 2013 ?Lumbar laminectomy 2015  ? ?PRECAUTIONS: Fall ? ?SUBJECTIVE: Patient reports her legs continue to hurt, they are also swollen which has affected her mobility. ? ?PAIN:  ?Are you having pain? Yes ?NPRS scale: 4/10 ?Pain location: Bilateral knees, low back  ?PAIN TYPE: ache, dull, sharp ?Pain description: constant  ?Aggravating factors: weather, standing, walking, transfers ?Relieving factors: medication  ? ?PATIENT GOALS: "stop hurting"  ? ? ?OBJECTIVE:  ?POSTURE:  ?Increased kyphosis, slumped posture in sitting  ?  ?LE MMT: ?  ?MMT Right ?04/30/2021 Left ?04/30/2021 Rt / Lt ?06/25/2021  ?Hip flexion 3-/5 3-/5  3 / /3  ?Hip extension       ?Hip abduction       ?Hip adduction       ?Hip internal rotation       ?Hip external rotation       ?Knee flexion 4+/5 4+/5    ?Knee extension 4+/5 4+/5   ?Ankle  dorsiflexion 5/5 5/5   ?Ankle plantarflexion       ?Ankle inversion       ?Ankle eversion       ?  ?FUNCTIONAL TESTS:  ?30 sec STS: 0  ?TUG: 25 seconds - 06/18/2021 (47 seconds - 05/12/2021; 63 seconds at eval) ? ?06/25/2021: ?Sit to supine: Mod I ?Supine to sit: Mod I  ?Sit to stand: Mod I ?Stand to Sit: Mod I ? ?2MWT: 240 ft - 06/25/2021 ?  ?GAIT: ?Distance walked: 240 ft - 06/25/2021 ?Assistive device utilized: Environmental consultant - 4 wheeled ?Level of assistance: Mod I ?Comments: shuffling, forward flexed posture, decreased step length  ?  ?  ?TODAY'S TREATMENT  ?[facility] Adult PT Treatment:  DATE: 06/25/2021 ?Therapeutic Exercise: ?NuStep L5 x 6 min with UE/LE while taking subjective ?Sit to stand 3 x 10 - from standard chair height, patient cued to rock forward to get nose over toes, and stand tall to improve posture ?LAQ with 4# 2 x 20 each ?Standing hip abduction with 4# 2 x 10 each ?Standing heel raises with 4# 2 x 15 ?Standing alternating march 2 x 20 ?Seated row with blue 2 x 20 ? ? ?[facility] Adult PT Treatment:                                                DATE: 06/18/2021 ?Therapeutic Exercise: ?NuStep L5 x 6 min with UE/LE while taking subjective ?Sit to stand 3 x 10 - from standard chair height, patient cued to rock forward to get nose over toes ?LAQ with 4# 2 x 15 each ?Standing hip abduction with 4# 2 x 10 each ?Standing heel raises with 4# 2 x 15 ?Standing alternating march 2 x 20 ? ?[facility] Adult PT Treatment:                                                DATE: 06/11/2021 ?Therapeutic Exercise: ?NuStep L5 x 6 min with UE/LE while taking subjective ?Supine PPT 2 x 10 - cued for bridge ?SLR 2 x 10 each ?SAQ with 3# 2 x 15 each ?LAQ with 3# 2 x 15 each ?Sit to stand 2 x 10 - from standard chair height, patient cued to rock forward to get nose over toes ?Seated row with green 2 x 15 ?Standing hip abduction 2 x 10 each ?Standing heel raises 2 x 10 ?Standing alternating march 2 x 20 ?   ?PATIENT EDUCATION:  ?Education details: POC extension, progress toward goals, HEP update ?Person educated: Patient; spouse  ?Education method: Explanation, Handout ?Education comprehension: Verbalized understanding ?  ?HOME

## 2021-06-25 ENCOUNTER — Encounter: Payer: Self-pay | Admitting: Physical Therapy

## 2021-06-25 ENCOUNTER — Other Ambulatory Visit: Payer: Self-pay

## 2021-06-25 ENCOUNTER — Ambulatory Visit: Payer: Medicare Other | Attending: Internal Medicine | Admitting: Physical Therapy

## 2021-06-25 DIAGNOSIS — R2689 Other abnormalities of gait and mobility: Secondary | ICD-10-CM | POA: Insufficient documentation

## 2021-06-25 DIAGNOSIS — M6281 Muscle weakness (generalized): Secondary | ICD-10-CM | POA: Insufficient documentation

## 2021-06-25 DIAGNOSIS — M25561 Pain in right knee: Secondary | ICD-10-CM | POA: Diagnosis not present

## 2021-06-25 DIAGNOSIS — M545 Low back pain, unspecified: Secondary | ICD-10-CM | POA: Diagnosis not present

## 2021-06-25 DIAGNOSIS — G8929 Other chronic pain: Secondary | ICD-10-CM | POA: Diagnosis not present

## 2021-06-25 DIAGNOSIS — M25562 Pain in left knee: Secondary | ICD-10-CM | POA: Diagnosis not present

## 2021-06-25 NOTE — Patient Instructions (Signed)
Access Code: [email or web address] ?URL: [email or web address] ?Date: 06/25/2021 ?Prepared by: Hilda Blades ? ?Exercises ?- Small Range Straight Leg Raise  - 1 x daily - 2 sets - 10 reps ?- Sidelying Hip Abduction  - 1 x daily - 2 sets - 10 reps ?- Seated Long Arc Quad  - 1 x daily - 2 sets - 15 reps ?- Sit to Stand with Counter Support  - 1 x daily - 5 reps ?- Seated Hip Abduction with Resistance  - 1 x daily - 2 sets - 15 reps ?- Seated March  - 1 x daily - 2 sets - 10 reps ?- Standing Hip Abduction with Counter Support  - 1 x daily - 2 sets - 10 reps ?- Heel Raises with Counter Support  - 1 x daily - 2 sets - 10 reps ?- Standing March with Counter Support  - 1 x daily - 2 sets - 20 reps ?

## 2021-07-01 NOTE — Therapy (Signed)
?OUTPATIENT PHYSICAL THERAPY TREATMENT NOTE ? ?Progress Note ?Reporting Period 04/29/2021 to 07/04/2021 ? ?See note below for Objective Data and Assessment of Progress/Goals.  ? ? ? ?Patient Name: ALEAN Klein ?MRN: [id number] ?DOB:August 18, 1945, 76 y.o., female ?Today's Date: 07/04/2021 ? ?PCP: Plotnikov, Evie Lacks, MD ?REFERRING PROVIDER: Margaretha Sheffield, MD ? ? PT End of Session - 07/04/21 1311   ? ? Visit Number 10   ? Number of Visits 13   ? Date for PT Re-Evaluation 07/23/21   ? Authorization Type UHC MCR   ? Progress Note Due on Visit 20   ? PT Start Time 1310   ? PT Stop Time 2979   ? PT Time Calculation (min) 35 min   ? Activity Tolerance Patient tolerated treatment well   ? Behavior During Therapy [facility] for tasks assessed/performed   ? ?  ?  ? ?  ? ? ? ? ? ? ? ? ? ?Past Medical History:  ?Diagnosis Date  ? Abnormal CBC 02/05/2015  ? [name] 12/16 new: normocytic anemia and thrombocytosis  ? Adjustment disorder with mixed anxiety and depressed mood 02/15/2007  ? Chronic  Chronic pain Grief, stress Effexor XR  Dad died in Feb 25, 2023  ? Asthma   ? Diverticulosis of colon (without mention of hemorrhage)   ? Esophageal stricture   ? Essential hypertension 01/26/2007  ? Chronic Verapamil   ? Falls   ? "I blackout"  ? GERD (gastroesophageal reflux disease) 02/15/2007  ? Chronic    ? Heart murmur   ? Hyperglycemia 03/02/2011  ? Mild    ? Insomnia disorder 12/08/2018  ? 10/20 Carbid/Lev dose was increased - c/o hard time falling asleep (6 am) getting up at 12 am, poor sleep. Try Temazepam 15-30 mg at 11-1:30 pm   ? Internal hemorrhoids without mention of complication   ? Iron deficiency anemia 04/23/2017  ? Mild neurocognitive disorder, likely due to Parkinson's disease 02/09/2019  ? OSA (obstructive sleep apnea) 02/14/2010  ? In lab study (May 2018): AHI 25. Only had 45 minutes of sleep secondary to frequent awakenings secondary to sleep apnea. autocpap 5-15 cm water.   ? Osteoarthritis   ? Parkinson disease ([place])  2014-02-24  ? 2015 2017  Primidone - d/c 2019 Parkinson's: Sinmet IR. Dr Tat  ? Postherpetic trigeminal neuralgia 01/26/2007  ? Qualifier: Diagnosis of  By: Reatha Armour, [name]    ? Shortness of breath dyspnea   ? with allergies  ? Thrombocytosis 05/24/2017  ? Urinary incontinence 01/26/2007  ? Chronic  10/17 Detrol LA   ? Vitamin D deficiency 01/26/2019  ? ?Past Surgical History:  ?Procedure Laterality Date  ? ABDOMINAL HYSTERECTOMY    ? BREAST CYST EXCISION Left 1980's  ? LUMBAR LAMINECTOMY/DECOMPRESSION MICRODISCECTOMY Bilateral 02/20/2014  ? Procedure: LUMBAR TWO THREE, LUMBAR THREE FOUR LUMBAR LAMINECTOMY/DECOMPRESSION MICRODISCECTOMY 2 LEVELS;  Surgeon: Eustace Moore, MD;  Location: [place] [facility];  Service: Neurosurgery;  Laterality: Bilateral;  ? SPLENECTOMY    ? TIBIA FRACTURE SURGERY Right   ? TIBIA FRACTURE SURGERY Left   ? TOTAL KNEE ARTHROPLASTY Right 2013  ? ?Patient Active Problem List  ? Diagnosis Date Noted  ? Complete tear of right rotator cuff 08/22/2020  ? Syncope and collapse 08/06/2020  ? Shoulder contusion 07/31/2020  ? Fall 07/31/2020  ? Concussion 07/31/2020  ? Anosmia 05/02/2020  ? Grief reaction 03/27/2020  ? Memory loss 12/13/2019  ? Food poisoning 09/11/2019  ? B12 deficiency 06/14/2019  ? Mild neurocognitive disorder, likely due  to Parkinson's disease 02/09/2019  ? Vitamin D deficiency 01/26/2019  ? Sweating profusely 12/08/2018  ? Insomnia disorder 12/08/2018  ? Cerumen impaction 10/19/2018  ? Lumbar facet arthropathy 02/10/2018  ? Thrombocytosis 05/24/2017  ? Iron deficiency anemia 04/23/2017  ? Cough 05/12/2016  ? Pseudophakia of both eyes 10/09/2015  ? Abnormal CBC 02/05/2015  ? Edema 10/15/2014  ? S/P lumbar laminectomy 02/20/2014  ? Pre-ulcerative calluses 02/09/2014  ? Parkinson disease ([place]) 02/09/2014  ? Cold sore 10/17/2013  ? Low back pain 07/25/2013  ? Nuclear sclerosis 02/08/2013  ? Heel ulcer ([place]) 03/08/2012  ? Rash 12/04/2011  ? Neck pain on left side 08/06/2011  ? Presence of  unspecified artificial knee joint 04/22/2011  ? Hyperglycemia 03/02/2011  ? Acquired spondylolisthesis 12/25/2010  ? Degeneration of lumbosacral intervertebral disc 12/25/2010  ? Arthritis of right knee 12/03/2010  ? Knee pain, right 09/08/2010  ? OSA (obstructive sleep apnea) 02/14/2010  ? Upper respiratory infection 01/13/2010  ? BRONCHITIS, ACUTE 04/05/2009  ? Vertigo 12/02/2007  ? Other abnormal glucose 12/02/2007  ? Otitis media 10/26/2007  ? Other malaise and fatigue 05/11/2007  ? Adjustment disorder with mixed anxiety and depressed mood 02/15/2007  ? GERD (gastroesophageal reflux disease) 02/15/2007  ? Osteoarthritis 02/15/2007  ? Postherpetic trigeminal neuralgia 01/26/2007  ? Herpes zoster 01/26/2007  ? Morbid obesity 01/26/2007  ? Migraine headaches 01/26/2007  ? Essential hypertension 01/26/2007  ? Asthma 01/26/2007  ? Urinary incontinence 01/26/2007  ? ? ?REFERRING PROVIDER: Margaretha Sheffield, MD  ?  ?REFERRING DIAG:  ?Lumbar spondylosis, stenosis  ?Lt knee OA ?Parkinson's disease  ? ?THERAPY DIAG:  ?Other abnormalities of gait and mobility ? ?Chronic bilateral low back pain, unspecified whether sciatica present ? ?Chronic pain of left knee ? ?Chronic pain of right knee ? ?Muscle weakness (generalized) ? ?PERTINENT HISTORY:  ?Rt TKA 2013 ?Lumbar laminectomy 2015  ? ?PRECAUTIONS: Fall ? ?SUBJECTIVE: Patient reports she is hurting today, she doesn't know why but she woke up and was stiff. ? ?PAIN:  ?Are you having pain? Yes ?NPRS scale: 6/10 ?Pain location: Bilateral knees, low back  ?PAIN TYPE: ache, dull, sharp ?Pain description: constant  ?Aggravating factors: weather, standing, walking, transfers ?Relieving factors: medication  ? ?PATIENT GOALS: "stop hurting"  ? ? ?OBJECTIVE:  ?POSTURE:  ?Increased kyphosis, slumped posture in sitting  ?  ?LE MMT: ?  ?MMT Right ?04/30/2021 Left ?04/30/2021 Rt / Lt ?06/25/2021  ?Hip flexion 3-/5 3-/5  3 / /3  ?Hip extension       ?Hip abduction       ?Hip adduction       ?Hip  internal rotation       ?Hip external rotation       ?Knee flexion 4+/5 4+/5    ?Knee extension 4+/5 4+/5   ?Ankle dorsiflexion 5/5 5/5   ?Ankle plantarflexion       ?Ankle inversion       ?Ankle eversion       ?  ?FUNCTIONAL TESTS:  ?30 sec STS: 4 - use of BUE pushing up from chair - 07/04/2021 ? ?TUG: 25 seconds - 06/18/2021 (47 seconds - 05/12/2021; 63 seconds at eval) ? ?06/25/2021: ?Sit to supine: Mod I ?Supine to sit: Mod I  ?Sit to stand: Mod I ?Stand to Sit: Mod I ? ?2MWT: 240 ft - 06/25/2021 ?  ?GAIT: ?Distance walked: 240 ft - 06/25/2021 ?Assistive device utilized: Environmental consultant - 4 wheeled ?Level of assistance: Mod I ?Comments: shuffling, forward flexed posture, decreased step length  ?  ?  ?  TODAY'S TREATMENT  ?[facility] Adult PT Treatment:                                                DATE: 07/04/2021 ?Therapeutic Exercise: ?NuStep L5 x 6 min with UE/LE while taking subjective ?Sit to stand 3 x 5 - from standard chair, patient cued to rock forward to get nose over toes, and stand tall to improve posture ?LAQ with 4# 2 x 20 each ?Standing hip abduction with 4# 2 x 10 each ?Standing heel raises with 4# 2 x 20 ?Standing alternating march 2 x 20 ?Standing row with green 2 x 20 ?Walking 1 lap x 2 using rollator for activity tolerance progression ? ? ?[facility] Adult PT Treatment:                                                DATE: 06/25/2021 ?Therapeutic Exercise: ?NuStep L5 x 6 min with UE/LE while taking subjective ?Sit to stand 3 x 10 - from standard chair height, patient cued to rock forward to get nose over toes, and stand tall to improve posture ?LAQ with 4# 2 x 20 each ?Standing hip abduction with 4# 2 x 10 each ?Standing heel raises with 4# 2 x 15 ?Standing alternating march 2 x 20 ?Seated row with blue 2 x 20 ? ?[facility] Adult PT Treatment:                                                DATE: 06/18/2021 ?Therapeutic Exercise: ?NuStep L5 x 6 min with UE/LE while taking subjective ?Sit to stand 3 x 10 - from standard chair height,  patient cued to rock forward to get nose over toes ?LAQ with 4# 2 x 15 each ?Standing hip abduction with 4# 2 x 10 each ?Standing heel raises with 4# 2 x 15 ?Standing alternating march 2 x 20 ?  ?PATIENT EDUCA

## 2021-07-04 ENCOUNTER — Other Ambulatory Visit: Payer: Self-pay

## 2021-07-04 ENCOUNTER — Ambulatory Visit: Payer: Medicare Other | Admitting: Physical Therapy

## 2021-07-04 ENCOUNTER — Encounter: Payer: Self-pay | Admitting: Physical Therapy

## 2021-07-04 DIAGNOSIS — M545 Low back pain, unspecified: Secondary | ICD-10-CM

## 2021-07-04 DIAGNOSIS — M6281 Muscle weakness (generalized): Secondary | ICD-10-CM | POA: Diagnosis not present

## 2021-07-04 DIAGNOSIS — R2689 Other abnormalities of gait and mobility: Secondary | ICD-10-CM | POA: Diagnosis not present

## 2021-07-04 DIAGNOSIS — G8929 Other chronic pain: Secondary | ICD-10-CM | POA: Diagnosis not present

## 2021-07-04 DIAGNOSIS — M25561 Pain in right knee: Secondary | ICD-10-CM | POA: Diagnosis not present

## 2021-07-04 DIAGNOSIS — M25562 Pain in left knee: Secondary | ICD-10-CM | POA: Diagnosis not present

## 2021-07-08 NOTE — Therapy (Signed)
?OUTPATIENT PHYSICAL THERAPY TREATMENT NOTE ? ?DISCHARGE ? ? ? ?Patient Name: Mallory Klein ?MRN: [id number] ?DOB:04-May-1945, 76 y.o., female ?Today's Date: 07/09/2021 ? ?PCP: Plotnikov, Evie Lacks, MD ?REFERRING PROVIDER: Margaretha Sheffield, MD ? ? PT End of Session - 07/09/21 1457   ? ? Visit Number 11   ? Number of Visits 13   ? Date for PT Re-Evaluation 07/23/21   ? Authorization Type UHC MCR   ? Progress Note Due on Visit 20   ? PT Start Time 1450   ? PT Stop Time 1530   ? PT Time Calculation (min) 40 min   ? Activity Tolerance Patient tolerated treatment well   ? Behavior During Therapy [facility] for tasks assessed/performed   ? ?  ?  ? ?  ? ? ? ? ? ? ? ? ? ? ?Past Medical History:  ?Diagnosis Date  ? Abnormal CBC 02/05/2015  ? [name] 12/16 new: normocytic anemia and thrombocytosis  ? Adjustment disorder with mixed anxiety and depressed mood 02/15/2007  ? Chronic  Chronic pain Grief, stress Effexor XR  Dad died in 16-Feb-2023  ? Asthma   ? Diverticulosis of colon (without mention of hemorrhage)   ? Esophageal stricture   ? Essential hypertension 01/26/2007  ? Chronic Verapamil   ? Falls   ? "I blackout"  ? GERD (gastroesophageal reflux disease) 02/15/2007  ? Chronic    ? Heart murmur   ? Hyperglycemia 03/02/2011  ? Mild    ? Insomnia disorder 12/08/2018  ? 10/20 Carbid/Lev dose was increased - c/o hard time falling asleep (6 am) getting up at 12 am, poor sleep. Try Temazepam 15-30 mg at 11-1:30 pm   ? Internal hemorrhoids without mention of complication   ? Iron deficiency anemia 04/23/2017  ? Mild neurocognitive disorder, likely due to Parkinson's disease 02/09/2019  ? OSA (obstructive sleep apnea) 02/14/2010  ? In lab study (May 2018): AHI 25. Only had 45 minutes of sleep secondary to frequent awakenings secondary to sleep apnea. autocpap 5-15 cm water.   ? Osteoarthritis   ? Parkinson disease ([place]) 2014-02-15  ? 2015 2017  Primidone - d/c 2019 Parkinson's: Sinmet IR. Dr Tat  ? Postherpetic trigeminal neuralgia  01/26/2007  ? Qualifier: Diagnosis of  By: Reatha Armour, [name]    ? Shortness of breath dyspnea   ? with allergies  ? Thrombocytosis 05/24/2017  ? Urinary incontinence 01/26/2007  ? Chronic  10/17 Detrol LA   ? Vitamin D deficiency 01/26/2019  ? ?Past Surgical History:  ?Procedure Laterality Date  ? ABDOMINAL HYSTERECTOMY    ? BREAST CYST EXCISION Left 1980's  ? LUMBAR LAMINECTOMY/DECOMPRESSION MICRODISCECTOMY Bilateral 02/20/2014  ? Procedure: LUMBAR TWO THREE, LUMBAR THREE FOUR LUMBAR LAMINECTOMY/DECOMPRESSION MICRODISCECTOMY 2 LEVELS;  Surgeon: Eustace Moore, MD;  Location: [place] [facility];  Service: Neurosurgery;  Laterality: Bilateral;  ? SPLENECTOMY    ? TIBIA FRACTURE SURGERY Right   ? TIBIA FRACTURE SURGERY Left   ? TOTAL KNEE ARTHROPLASTY Right 2013  ? ?Patient Active Problem List  ? Diagnosis Date Noted  ? Complete tear of right rotator cuff 08/22/2020  ? Syncope and collapse 08/06/2020  ? Shoulder contusion 07/31/2020  ? Fall 07/31/2020  ? Concussion 07/31/2020  ? Anosmia 05/02/2020  ? Grief reaction 03/27/2020  ? Memory loss 12/13/2019  ? Food poisoning 09/11/2019  ? B12 deficiency 06/14/2019  ? Mild neurocognitive disorder, likely due to Parkinson's disease 02/09/2019  ? Vitamin D deficiency 01/26/2019  ? Sweating profusely 12/08/2018  ?  Insomnia disorder 12/08/2018  ? Cerumen impaction 10/19/2018  ? Lumbar facet arthropathy 02/10/2018  ? Thrombocytosis 05/24/2017  ? Iron deficiency anemia 04/23/2017  ? Cough 05/12/2016  ? Pseudophakia of both eyes 10/09/2015  ? Abnormal CBC 02/05/2015  ? Edema 10/15/2014  ? S/P lumbar laminectomy 02/20/2014  ? Pre-ulcerative calluses 02/09/2014  ? Parkinson disease ([place]) 02/09/2014  ? Cold sore 10/17/2013  ? Low back pain 07/25/2013  ? Nuclear sclerosis 02/08/2013  ? Heel ulcer ([place]) 03/08/2012  ? Rash 12/04/2011  ? Neck pain on left side 08/06/2011  ? Presence of unspecified artificial knee joint 04/22/2011  ? Hyperglycemia 03/02/2011  ? Acquired spondylolisthesis 12/25/2010   ? Degeneration of lumbosacral intervertebral disc 12/25/2010  ? Arthritis of right knee 12/03/2010  ? Knee pain, right 09/08/2010  ? OSA (obstructive sleep apnea) 02/14/2010  ? Upper respiratory infection 01/13/2010  ? BRONCHITIS, ACUTE 04/05/2009  ? Vertigo 12/02/2007  ? Other abnormal glucose 12/02/2007  ? Otitis media 10/26/2007  ? Other malaise and fatigue 05/11/2007  ? Adjustment disorder with mixed anxiety and depressed mood 02/15/2007  ? GERD (gastroesophageal reflux disease) 02/15/2007  ? Osteoarthritis 02/15/2007  ? Postherpetic trigeminal neuralgia 01/26/2007  ? Herpes zoster 01/26/2007  ? Morbid obesity 01/26/2007  ? Migraine headaches 01/26/2007  ? Essential hypertension 01/26/2007  ? Asthma 01/26/2007  ? Urinary incontinence 01/26/2007  ? ? ?REFERRING PROVIDER: Margaretha Sheffield, MD  ?  ?REFERRING DIAG:  ?Lumbar spondylosis, stenosis  ?Lt knee OA ?Parkinson's disease  ? ?THERAPY DIAG:  ?Other abnormalities of gait and mobility ? ?Chronic bilateral low back pain, unspecified whether sciatica present ? ?Chronic pain of left knee ? ?Chronic pain of right knee ? ?Muscle weakness (generalized) ? ?PERTINENT HISTORY:  ?Rt TKA 2013 ?Lumbar laminectomy 2015  ? ?PRECAUTIONS: Fall ? ?SUBJECTIVE: Patient reports her knees continue to hurt. She does continue work on getting up from the couch. ? ?PAIN:  ?Are you having pain? Yes ?NPRS scale: 6/10 ?Pain location: Bilateral knees ?PAIN TYPE: ache, dull, sharp ?Pain description: constant  ?Aggravating factors: weather, standing, walking, transfers ?Relieving factors: medication  ? ?PATIENT GOALS: "stop hurting"  ? ? ?OBJECTIVE:  ?POSTURE:  ?Increased kyphosis, slumped posture in sitting  ?  ?LE MMT: ?  ?MMT Right ?04/30/2021 Left ?04/30/2021 Rt / Lt ?06/25/2021 Rt / Lt  ?Hip flexion 3-/5 3-/5  3 / /3 3+ / 3+  ?Hip extension        ?Hip abduction        ?Hip adduction        ?Hip internal rotation        ?Hip external rotation        ?Knee flexion 4+/5 4+/5   4+ / 4+  ?Knee  extension 4+/5 4+/5  4+ / 4+  ?Ankle dorsiflexion 5/5 5/5    ?Ankle plantarflexion        ?Ankle inversion        ?Ankle eversion        ?  ?FUNCTIONAL TESTS:  ?30 sec STS: 8 - use of BUE pushing up from chair - 07/09/2021 ? ?TUG: 21 seconds - 07/09/2021 (25 seconds - 06/18/2021; 47 seconds - 05/12/2021; 63 seconds at eval) ? ?06/25/2021: ?Sit to supine: Mod I ?Supine to sit: Mod I  ?Sit to stand: Mod I ?Stand to Sit: Mod I ? ?2MWT: 265 ft - 07/09/2021 (240 ft - 06/25/2021) ?  ?GAIT: ?Distance walked: 265 ft - 07/09/2021 ?Assistive device utilized: Environmental consultant - 4 wheeled ?Level of assistance: Mod  I ?Comments: shuffling, forward flexed posture, decreased step length  ?  ?  ?TODAY'S TREATMENT  ?[facility] Adult PT Treatment:                                                DATE: 07/09/2021 ?Therapeutic Exercise: ?NuStep L5 x 6 min with UE/LE while taking subjective ?Walking 2 laps while performing 2MWT ?Sit to stand 2 x 10 - from standard chair, patient cued to rock forward to get nose over toes, and stand tall to improve posture ?LAQ with 5# 2 x 20 each ?Standing hip abduction with 5# 2 x 10 each ?Standing heel raises with 5# 2 x 20 ?Standing alternating march 2 x 20 ?Standing row with green 2 x 20 ?Seated clamshell with green x 20 ?Seated march with green x 20 ? ? ?[facility] Adult PT Treatment:                                                DATE: 07/04/2021 ?Therapeutic Exercise: ?NuStep L5 x 6 min with UE/LE while taking subjective ?Sit to stand 3 x 5 - from standard chair, patient cued to rock forward to get nose over toes, and stand tall to improve posture ?LAQ with 4# 2 x 20 each ?Standing hip abduction with 4# 2 x 10 each ?Standing heel raises with 4# 2 x 20 ?Standing alternating march 2 x 20 ?Standing row with green 2 x 20 ?Walking 1 lap x 2 using rollator for activity tolerance progression ? ?[facility] Adult PT Treatment:                                                DATE: 06/25/2021 ?Therapeutic Exercise: ?NuStep L5 x 6 min with UE/LE while  taking subjective ?Sit to stand 3 x 10 - from standard chair height, patient cued to rock forward to get nose over toes, and stand tall to improve posture ?LAQ with 4# 2 x 20 each ?Standing hip abduction with 4#

## 2021-07-09 ENCOUNTER — Encounter: Payer: Self-pay | Admitting: Physical Therapy

## 2021-07-09 ENCOUNTER — Ambulatory Visit: Payer: Medicare Other | Admitting: Physical Therapy

## 2021-07-09 ENCOUNTER — Other Ambulatory Visit: Payer: Self-pay

## 2021-07-09 DIAGNOSIS — M25561 Pain in right knee: Secondary | ICD-10-CM | POA: Diagnosis not present

## 2021-07-09 DIAGNOSIS — M545 Low back pain, unspecified: Secondary | ICD-10-CM | POA: Diagnosis not present

## 2021-07-09 DIAGNOSIS — M6281 Muscle weakness (generalized): Secondary | ICD-10-CM

## 2021-07-09 DIAGNOSIS — R2689 Other abnormalities of gait and mobility: Secondary | ICD-10-CM | POA: Diagnosis not present

## 2021-07-09 DIAGNOSIS — G8929 Other chronic pain: Secondary | ICD-10-CM

## 2021-07-09 DIAGNOSIS — M25562 Pain in left knee: Secondary | ICD-10-CM | POA: Diagnosis not present

## 2021-07-09 NOTE — Patient Instructions (Signed)
Access Code: [email or web address] ?URL: [email or web address] ?Date: 07/09/2021 ?Prepared by: Hilda Blades ? ?Exercises ?- Small Range Straight Leg Raise  - 1 x daily - 2 sets - 10 reps ?- Sidelying Hip Abduction  - 1 x daily - 2 sets - 10 reps ?- Seated Long Arc Quad  - 1 x daily - 2 sets - 15 reps ?- Sit to Stand with Counter Support  - 1 x daily - 5 reps ?- Seated Hip Abduction with Resistance  - 1 x daily - 2 sets - 15 reps ?- Seated March with Resistance  - 1 x daily - 2 sets - 20 reps ?- Standing Hip Abduction with Counter Support  - 1 x daily - 2 sets - 10 reps ?- Heel Raises with Counter Support  - 1 x daily - 2 sets - 10 reps ?- Standing March with Counter Support  - 1 x daily - 2 sets - 20 reps ?

## 2021-07-15 DIAGNOSIS — M1712 Unilateral primary osteoarthritis, left knee: Secondary | ICD-10-CM | POA: Diagnosis not present

## 2021-07-15 DIAGNOSIS — G894 Chronic pain syndrome: Secondary | ICD-10-CM | POA: Diagnosis not present

## 2021-07-15 DIAGNOSIS — M47816 Spondylosis without myelopathy or radiculopathy, lumbar region: Secondary | ICD-10-CM | POA: Diagnosis not present

## 2021-07-15 DIAGNOSIS — M48061 Spinal stenosis, lumbar region without neurogenic claudication: Secondary | ICD-10-CM | POA: Diagnosis not present

## 2021-07-21 ENCOUNTER — Other Ambulatory Visit: Payer: Self-pay | Admitting: Internal Medicine

## 2021-07-22 ENCOUNTER — Ambulatory Visit: Payer: Medicare Other | Admitting: Neurology

## 2021-07-22 ENCOUNTER — Encounter: Payer: Self-pay | Admitting: Neurology

## 2021-07-31 ENCOUNTER — Encounter: Payer: Self-pay | Admitting: Internal Medicine

## 2021-07-31 ENCOUNTER — Ambulatory Visit (INDEPENDENT_AMBULATORY_CARE_PROVIDER_SITE_OTHER): Payer: Medicare Other | Admitting: Internal Medicine

## 2021-07-31 DIAGNOSIS — G20 Parkinson's disease: Secondary | ICD-10-CM | POA: Diagnosis not present

## 2021-07-31 DIAGNOSIS — R609 Edema, unspecified: Secondary | ICD-10-CM | POA: Diagnosis not present

## 2021-07-31 DIAGNOSIS — R32 Unspecified urinary incontinence: Secondary | ICD-10-CM | POA: Diagnosis not present

## 2021-07-31 DIAGNOSIS — I10 Essential (primary) hypertension: Secondary | ICD-10-CM | POA: Diagnosis not present

## 2021-07-31 NOTE — Assessment & Plan Note (Signed)
Continue w/Ditropan 5 mg tid prn  Potential benefits of a long term Ditropan  use as well as potential risks  and complications were explained to the patient and were aknowledged.

## 2021-07-31 NOTE — Assessment & Plan Note (Signed)
BP Readings from Last 3 Encounters:  07/31/21 140/80  06/19/21 138/78  05/14/21 134/76  Furosemide prn

## 2021-07-31 NOTE — Assessment & Plan Note (Signed)
Wt Readings from Last 3 Encounters:  07/31/21 172 lb (78 kg)  06/19/21 173 lb 6.4 oz (78.7 kg)  05/14/21 176 lb 5.9 oz (80 kg)

## 2021-07-31 NOTE — Assessment & Plan Note (Signed)
Foam wedge to elevate legs Take Furosemide prn

## 2021-07-31 NOTE — Progress Notes (Signed)
Subjective:  Patient ID: Mallory Klein, female    DOB: 1945-11-24  Age: 76 y.o. MRN: [id number]  CC: No chief complaint on file.   HPI MONIA TIMMERS presents for OAB, LBP, edema, HTN Sleep  Outpatient Medications Prior to Visit  Medication Sig Dispense Refill   buprenorphine (BUTRANS) 7.5 MCG/HR 1 patch once a week.     celecoxib (CELEBREX) 200 MG capsule Take 1 capsule (200 mg total) by mouth 2 (two) times daily as needed for moderate pain. 60 capsule 3   cetirizine (ZYRTEC) 10 MG tablet TAKE 1 TABLET BY MOUTH EVERY DAY 90 tablet 3   Cholecalciferol 25 MCG (1000 UT) tablet Take by mouth.     [employer] SENNA PLUS 8.6-50 MG tablet Take 1 tablet by mouth 2 (two) times daily.     cyanocobalamin (,VITAMIN B-12,) 1000 MCG/ML injection INJECT 1 ML INTO THE SKIN EVERY 14 DAYS 10 mL 3   diclofenac Sodium (VOLTAREN) 1 % GEL Apply 4 g topically daily as needed (pain).     docusate sodium (COLACE) 100 MG capsule Take 100 mg by mouth at bedtime.     furosemide (LASIX) 20 MG tablet Take 1-2 tablets (20-40 mg total) by mouth daily as needed. 60 tablet 3   hydrochlorothiazide (MICROZIDE) 12.5 MG capsule Take 12.5 mg by mouth daily.     hydrOXYzine (VISTARIL) 50 MG capsule Take 1-2 capsules (50-100 mg total) by mouth 3 (three) times daily as needed (sleep). 60 capsule 5   montelukast (SINGULAIR) 10 MG tablet TAKE 1 TABLET BY MOUTH  DAILY 90 tablet 3   OVER THE COUNTER MEDICATION Take 1 tablet by mouth at bedtime. Lions mane extract     oxybutynin (DITROPAN) 5 MG tablet Take 1 tablet (5 mg total) by mouth every 8 (eight) hours as needed for bladder spasms. 90 tablet 3   pantoprazole (PROTONIX) 40 MG tablet TAKE 1 TABLET BY MOUTH  DAILY 90 tablet 3   polyethylene glycol (MIRALAX / GLYCOLAX) 17 g packet Take 17 g by mouth daily as needed for mild constipation.     pramipexole (MIRAPEX) 0.75 MG tablet Take 1 tablet (0.75 mg total) by mouth 3 (three) times daily. 270 tablet 3    promethazine-dextromethorphan (PROMETHAZINE-DM) 6.25-15 MG/5ML syrup Take 5 mLs by mouth 4 (four) times daily as needed for cough. 240 mL 0   QUEtiapine (SEROQUEL) 100 MG tablet Take 1 tablet (100 mg total) by mouth at bedtime. 30 tablet 3   rosuvastatin (CRESTOR) 10 MG tablet Take 1 tablet (10 mg total) by mouth daily. 90 tablet 3   buprenorphine (BUTRANS) 10 MCG/HR PTWK 1 patch once a week. (Patient not taking: Reported on 04/09/2021)     No facility-administered medications prior to visit.    ROS: Review of Systems  Constitutional:  Positive for fatigue. Negative for activity change, appetite change, chills and unexpected weight change.  HENT:  Negative for congestion, mouth sores and sinus pressure.   Eyes:  Negative for visual disturbance.  Respiratory:  Negative for cough and chest tightness.   Gastrointestinal:  Negative for abdominal pain and nausea.  Genitourinary:  Negative for difficulty urinating, frequency and vaginal pain.  Musculoskeletal:  Positive for arthralgias, back pain, gait problem, neck pain and neck stiffness.  Skin:  Negative for color change, pallor and rash.  Neurological:  Negative for dizziness, tremors, weakness, numbness and headaches.  Psychiatric/Behavioral:  Positive for sleep disturbance. Negative for confusion, dysphoric mood and suicidal ideas. The patient is nervous/anxious.  Objective:  BP 140/80 (BP Location: Left Arm, Patient Position: Sitting, Cuff Size: Normal)   Pulse (!) 102   Temp 98.3 F (36.8 C) (Oral)   Ht 5' 1.5" (1.562 m)   Wt 172 lb (78 kg)   SpO2 99%   BMI 31.97 kg/m   BP Readings from Last 3 Encounters:  07/31/21 140/80  06/19/21 138/78  05/14/21 134/76    Wt Readings from Last 3 Encounters:  07/31/21 172 lb (78 kg)  06/19/21 173 lb 6.4 oz (78.7 kg)  05/14/21 176 lb 5.9 oz (80 kg)    Physical Exam Constitutional:      General: She is not in acute distress.    Appearance: She is well-developed. She is obese. She  is not ill-appearing.  HENT:     Head: Normocephalic.     Right Ear: External ear normal.     Left Ear: External ear normal.     Nose: Nose normal.  Eyes:     General:        Right eye: No discharge.        Left eye: No discharge.     Conjunctiva/sclera: Conjunctivae normal.     Pupils: Pupils are equal, round, and reactive to light.  Neck:     Thyroid: No thyromegaly.     Vascular: No JVD.     Trachea: No tracheal deviation.  Cardiovascular:     Rate and Rhythm: Normal rate and regular rhythm.     Heart sounds: Normal heart sounds.  Pulmonary:     Effort: No respiratory distress.     Breath sounds: No stridor. No wheezing.  Abdominal:     General: Bowel sounds are normal. There is no distension.     Palpations: Abdomen is soft. There is no mass.     Tenderness: There is no abdominal tenderness. There is no guarding or rebound.  Musculoskeletal:        General: Tenderness present.     Cervical back: Normal range of motion and neck supple. No rigidity.     Right lower leg: Edema present.     Left lower leg: Edema present.  Lymphadenopathy:     Cervical: No cervical adenopathy.  Skin:    Findings: No erythema or rash.  Neurological:     Mental Status: She is oriented to person, place, and time.     Cranial Nerves: No cranial nerve deficit.     Motor: No abnormal muscle tone.     Coordination: Coordination abnormal.     Gait: Gait abnormal.     Deep Tendon Reflexes: Reflexes normal.  Psychiatric:        Behavior: Behavior normal.        Thought Content: Thought content normal.        Judgment: Judgment normal.   Using a walker  Lab Results  Component Value Date   WBC 8.1 02/21/2021   HGB 12.4 02/21/2021   HCT 38.9 02/21/2021   PLT 289 02/21/2021   GLUCOSE 92 05/14/2021   CHOL 200 (H) 01/07/2021   TRIG 53 01/07/2021   HDL 79 01/07/2021   LDLDIRECT 109.8 02/10/2010   LDLCALC 111 (H) 01/07/2021   ALT 6 05/14/2021   AST 14 05/14/2021   NA 143 05/14/2021   K  3.9 05/14/2021   CL 104 05/14/2021   CREATININE 0.95 05/14/2021   BUN 12 05/14/2021   CO2 34 (H) 05/14/2021   TSH 0.67 06/25/2020   INR 0.97 02/07/2014   HGBA1C 5.8  05/14/2021    VAS [place] LOWER EXTREMITY VENOUS (DVT)  Result Date: 03/17/2021  Lower Venous DVT Study Patient Name:  RADLEY TESTON [facility]  Date of Exam:   03/17/2021 Medical Rec #: [id number]          Accession #:    [id number] Date of Birth: [birth year]         Patient Gender: F Patient Age:   77 years Exam Location:  [facility] Procedure:      VAS [place] LOWER EXTREMITY VENOUS (DVT) Referring Phys: Cathlean Cower --------------------------------------------------------------------------------  Indications: Patient denies any shortness of breath or chest pain. Patient states swelling in both legs from knee down for the past two months.  Risk Factors: Surgery Total right knee replacement 2013. Comparison Study: NA Performing Technologist: Leavy Cella RDCS  Examination Guidelines: A complete evaluation includes B-mode imaging, spectral Doppler, color Doppler, and power Doppler as needed of all accessible portions of each vessel. Bilateral testing is considered an integral part of a complete examination. Limited examinations for reoccurring indications may be performed as noted. The reflux portion of the exam is performed with the patient in reverse Trendelenburg.  +---------+---------------+---------+-----------+----------+--------------+ RIGHT    CompressibilityPhasicitySpontaneityPropertiesThrombus Aging +---------+---------------+---------+-----------+----------+--------------+ CFV      Full           Yes      Yes                                 +---------+---------------+---------+-----------+----------+--------------+ SFJ      Full           Yes      Yes                                 +---------+---------------+---------+-----------+----------+--------------+ FV Prox  Full           Yes      Yes                                  +---------+---------------+---------+-----------+----------+--------------+ FV Mid   Full           Yes      Yes                                 +---------+---------------+---------+-----------+----------+--------------+ FV DistalFull           Yes      Yes                                 +---------+---------------+---------+-----------+----------+--------------+ PFV      Full                                                        +---------+---------------+---------+-----------+----------+--------------+ POP      Full           Yes      Yes                                 +---------+---------------+---------+-----------+----------+--------------+  PTV      Full           Yes      Yes                                 +---------+---------------+---------+-----------+----------+--------------+ PERO     Full           Yes      Yes                                 +---------+---------------+---------+-----------+----------+--------------+ Gastroc  Full                                                        +---------+---------------+---------+-----------+----------+--------------+ GSV      Full           Yes      Yes                                 +---------+---------------+---------+-----------+----------+--------------+   +---------+---------------+---------+-----------+----------+--------------+ LEFT     CompressibilityPhasicitySpontaneityPropertiesThrombus Aging +---------+---------------+---------+-----------+----------+--------------+ CFV      Full           Yes      Yes                                 +---------+---------------+---------+-----------+----------+--------------+ SFJ      Full           Yes      Yes                                 +---------+---------------+---------+-----------+----------+--------------+ FV Prox  Full           Yes      Yes                                  +---------+---------------+---------+-----------+----------+--------------+ FV Mid   Full           Yes      Yes                                 +---------+---------------+---------+-----------+----------+--------------+ FV DistalFull           Yes      Yes                                 +---------+---------------+---------+-----------+----------+--------------+ PFV      Full                                                        +---------+---------------+---------+-----------+----------+--------------+ POP      Full           Yes  Yes                                 +---------+---------------+---------+-----------+----------+--------------+ PTV      Full           Yes      Yes                                 +---------+---------------+---------+-----------+----------+--------------+ PERO     Full           Yes      Yes                                 +---------+---------------+---------+-----------+----------+--------------+ Gastroc  Full                                                        +---------+---------------+---------+-----------+----------+--------------+ GSV      Full           Yes      Yes                                 +---------+---------------+---------+-----------+----------+--------------+    Findings reported to Kallie Edward at 2:41pm.  Summary: BILATERAL: - No evidence of deep vein thrombosis seen in the lower extremities, bilaterally. -No evidence of popliteal cyst, bilaterally.   *See table(s) above for measurements and observations. Electronically signed by Berniece Salines DO on 03/17/2021 at 6:12:05 PM.    Final     Assessment & Plan:   Problem List Items Addressed This Visit     Edema    Foam wedge to elevate legs Take Furosemide prn      Essential hypertension    BP Readings from Last 3 Encounters:  07/31/21 140/80  06/19/21 138/78  05/14/21 134/76  Furosemide prn       Morbid obesity    Wt Readings from Last  3 Encounters:  07/31/21 172 lb (78 kg)  06/19/21 173 lb 6.4 oz (78.7 kg)  05/14/21 176 lb 5.9 oz (80 kg)        Parkinson disease (HCC)    On Mirapex, off Sinemet      Urinary incontinence    Continue w/Ditropan 5 mg tid prn  Potential benefits of a long term Ditropan  use as well as potential risks  and complications were explained to the patient and were aknowledged.           No orders of the defined types were placed in this encounter.     Follow-up: Return in about 3 months (around 10/31/2021) for a follow-up visit.  Walker Kehr, MD

## 2021-07-31 NOTE — Assessment & Plan Note (Signed)
On Mirapex, off Sinemet

## 2021-08-13 DIAGNOSIS — M1712 Unilateral primary osteoarthritis, left knee: Secondary | ICD-10-CM | POA: Diagnosis not present

## 2021-08-13 DIAGNOSIS — M47816 Spondylosis without myelopathy or radiculopathy, lumbar region: Secondary | ICD-10-CM | POA: Diagnosis not present

## 2021-08-13 DIAGNOSIS — G894 Chronic pain syndrome: Secondary | ICD-10-CM | POA: Diagnosis not present

## 2021-08-13 DIAGNOSIS — M48061 Spinal stenosis, lumbar region without neurogenic claudication: Secondary | ICD-10-CM | POA: Diagnosis not present

## 2021-09-01 ENCOUNTER — Telehealth: Payer: Self-pay | Admitting: Neurology

## 2021-09-01 NOTE — Telephone Encounter (Signed)
LVM and mychart msg informing pt of need to reschedule - MD out

## 2021-09-03 ENCOUNTER — Other Ambulatory Visit: Payer: Self-pay | Admitting: Internal Medicine

## 2021-09-08 ENCOUNTER — Other Ambulatory Visit: Payer: Self-pay | Admitting: Internal Medicine

## 2021-09-10 DIAGNOSIS — M47816 Spondylosis without myelopathy or radiculopathy, lumbar region: Secondary | ICD-10-CM | POA: Diagnosis not present

## 2021-09-10 DIAGNOSIS — M48061 Spinal stenosis, lumbar region without neurogenic claudication: Secondary | ICD-10-CM | POA: Diagnosis not present

## 2021-09-10 DIAGNOSIS — G894 Chronic pain syndrome: Secondary | ICD-10-CM | POA: Diagnosis not present

## 2021-09-10 DIAGNOSIS — M1712 Unilateral primary osteoarthritis, left knee: Secondary | ICD-10-CM | POA: Diagnosis not present

## 2021-09-24 ENCOUNTER — Ambulatory Visit (INDEPENDENT_AMBULATORY_CARE_PROVIDER_SITE_OTHER): Payer: Medicare Other | Admitting: Internal Medicine

## 2021-09-24 VITALS — BP 138/86 | HR 91 | Temp 98.5°F | Ht 61.0 in | Wt 153.0 lb

## 2021-09-24 DIAGNOSIS — L509 Urticaria, unspecified: Secondary | ICD-10-CM | POA: Diagnosis not present

## 2021-09-24 DIAGNOSIS — G20 Parkinson's disease: Secondary | ICD-10-CM | POA: Diagnosis not present

## 2021-09-24 DIAGNOSIS — I10 Essential (primary) hypertension: Secondary | ICD-10-CM

## 2021-09-24 DIAGNOSIS — Z91148 Patient's other noncompliance with medication regimen for other reason: Secondary | ICD-10-CM | POA: Diagnosis not present

## 2021-09-24 MED ORDER — METHYLPREDNISOLONE ACETATE 80 MG/ML IJ SUSP
80.0000 mg | Freq: Once | INTRAMUSCULAR | Status: AC
  Administered 2021-09-24: 80 mg via INTRAMUSCULAR

## 2021-09-24 MED ORDER — CELECOXIB 200 MG PO CAPS: 200.0000 mg | ORAL_CAPSULE | Freq: Two times a day (BID) | ORAL | 1 refills | Status: DC | PRN

## 2021-09-24 MED ORDER — PREDNISONE 10 MG PO TABS: ORAL_TABLET | ORAL | 0 refills | Status: DC

## 2021-09-24 NOTE — Progress Notes (Unsigned)
Patient ID: Mallory Klein, female   DOB: 1945/11/18, 76 y.o.   MRN: [id number]        Chief Complaint: follow up quitting all of her medications for 1 month       HPI:  Mallory Klein is a 76 y.o. female here with hx of neurocognitive d/o related to PD and hx of depression mood disorder; after becoming frustrated with the number of pills and meds and stopped all of her meds for 1 months.  Denies worsening depressive symptoms, suicidal ideation, or panic.  Her parkinsons movements are now worsening so willing to restart her meds overall.  Sees neurology soon.  Husband with her has cognitive difficulty as well and not influencing either way.  Here today with itching all over and multiple hive lesions to extremities and torso.  Reason for the call to make the appt a few days ago was bilateral leg pain but this is vague and seems to have improved and denies an issue today.  Pt denies chest pain, increased sob or doe, wheezing, orthopnea, PND, increased LE swelling, palpitations, dizziness or syncope.   Pt denies polydipsia, polyuria, or new focal neuro s/s.   No recent falls.  Walks with walker today       Wt Readings from Last 3 Encounters:  09/24/21 153 lb (69.4 kg)  07/31/21 172 lb (78 kg)  06/19/21 173 lb 6.4 oz (78.7 kg)   BP Readings from Last 3 Encounters:  09/24/21 138/86  07/31/21 140/80  06/19/21 138/78         Past Medical History:  Diagnosis Date   Abnormal CBC 02/05/2015   [name] 12/16 new: normocytic anemia and thrombocytosis   Adjustment disorder with mixed anxiety and depressed mood 02/15/2007   Chronic  Chronic pain Grief, stress Effexor XR  Dad died in 11-Mar-2023   Asthma    Diverticulosis of colon (without mention of hemorrhage)    Esophageal stricture    Essential hypertension 01/26/2007   Chronic Verapamil    Falls    "I blackout"   GERD (gastroesophageal reflux disease) 02/15/2007   Chronic     Heart murmur    Hyperglycemia 03/02/2011   Mild     Insomnia  disorder 12/08/2018   10/20 Carbid/Lev dose was increased - c/o hard time falling asleep (6 am) getting up at 12 am, poor sleep. Try Temazepam 15-30 mg at 11-1:30 pm    Internal hemorrhoids without mention of complication    Iron deficiency anemia 04/23/2017   Mild neurocognitive disorder, likely due to Parkinson's disease 02/09/2019   OSA (obstructive sleep apnea) 02/14/2010   In lab study (May 2018): AHI 25. Only had 45 minutes of sleep secondary to frequent awakenings secondary to sleep apnea. autocpap 5-15 cm water.    Osteoarthritis    Parkinson disease ([place]) March 10, 2014   2015 2017  Primidone - d/c 2019 Parkinson's: [facility] IR. Dr Tat   Postherpetic trigeminal neuralgia 01/26/2007   Qualifier: Diagnosis of  By: Marca Ancona RMA, [name]     Shortness of breath dyspnea    with allergies   Thrombocytosis 05/24/2017   Urinary incontinence 01/26/2007   Chronic  10/17 Detrol LA    Vitamin D deficiency 01/26/2019   Past Surgical History:  Procedure Laterality Date   ABDOMINAL HYSTERECTOMY     BREAST CYST EXCISION Left 1980's   LUMBAR LAMINECTOMY/DECOMPRESSION MICRODISCECTOMY Bilateral 02/20/2014   Procedure: LUMBAR TWO THREE, LUMBAR THREE FOUR LUMBAR LAMINECTOMY/DECOMPRESSION MICRODISCECTOMY 2 LEVELS;  Surgeon: Eustace Moore,  MD;  Location: [facility];  Service: Neurosurgery;  Laterality: Bilateral;   SPLENECTOMY     TIBIA FRACTURE SURGERY Right    TIBIA FRACTURE SURGERY Left    TOTAL KNEE ARTHROPLASTY Right 2013    reports that she has never smoked. She has never used smokeless tobacco. She reports current alcohol use of about 2.0 standard drinks of alcohol per week. She reports that she does not use drugs. family history includes Asthma in her mother; Dementia in her mother; Diabetes in her mother; Diabetes type II in her mother; Hypertension in her mother; Mental illness in her father; Prostate cancer in her father; Stroke in her brother; Suicidality in her son. Allergies  Allergen  Reactions   Ace Inhibitors     Patient doesn't recall reaction.  jkl   Benicar [Olmesartan]     It made her sick   Aspirin Other (See Comments)    bruising   Citalopram Hydrobromide Diarrhea   Current Outpatient Medications on File Prior to Visit  Medication Sig Dispense Refill   buprenorphine (BUTRANS) 10 MCG/HR PTWK 1 patch once a week.     buprenorphine (BUTRANS) 7.5 MCG/HR 1 patch once a week.     cetirizine (ZYRTEC) 10 MG tablet TAKE 1 TABLET BY MOUTH EVERY DAY 90 tablet 3   Cholecalciferol (VITAMIN D3) 50 MCG (2000 UT) capsule TAKE 1 CAPSULE BY MOUTH EVERY DAY 100 capsule 3   Cholecalciferol 25 MCG (1000 UT) tablet Take by mouth.     [facility] SENNA PLUS 8.6-50 MG tablet Take 1 tablet by mouth 2 (two) times daily.     cyanocobalamin (,VITAMIN B-12,) 1000 MCG/ML injection INJECT 1 ML INTO THE SKIN EVERY 14 DAYS 10 mL 3   diclofenac Sodium (VOLTAREN) 1 % GEL Apply 4 g topically daily as needed (pain).     docusate sodium (COLACE) 100 MG capsule Take 100 mg by mouth at bedtime.     furosemide (LASIX) 20 MG tablet Take 1-2 tablets (20-40 mg total) by mouth daily as needed. 60 tablet 3   hydrochlorothiazide (MICROZIDE) 12.5 MG capsule Take 12.5 mg by mouth daily.     hydrOXYzine (VISTARIL) 50 MG capsule Take 1-2 capsules (50-100 mg total) by mouth 3 (three) times daily as needed (sleep). 60 capsule 5   montelukast (SINGULAIR) 10 MG tablet TAKE 1 TABLET BY MOUTH  DAILY 90 tablet 3   OVER THE COUNTER MEDICATION Take 1 tablet by mouth at bedtime. Lions mane extract     oxybutynin (DITROPAN) 5 MG tablet Take 1 tablet (5 mg total) by mouth every 8 (eight) hours as needed for bladder spasms. 90 tablet 3   pantoprazole (PROTONIX) 40 MG tablet TAKE 1 TABLET BY MOUTH  DAILY 90 tablet 3   polyethylene glycol (MIRALAX / GLYCOLAX) 17 g packet Take 17 g by mouth daily as needed for mild constipation.     pramipexole (MIRAPEX) 0.75 MG tablet Take 1 tablet (0.75 mg total) by mouth 3 (three) times daily. 270  tablet 3   promethazine-dextromethorphan (PROMETHAZINE-DM) 6.25-15 MG/5ML syrup Take 5 mLs by mouth 4 (four) times daily as needed for cough. 240 mL 0   QUEtiapine (SEROQUEL) 100 MG tablet Take 1 tablet (100 mg total) by mouth at bedtime. 30 tablet 3   rosuvastatin (CRESTOR) 10 MG tablet Take 1 tablet (10 mg total) by mouth daily. 90 tablet 3   [DISCONTINUED] donepezil (ARICEPT) 5 MG tablet Take 1 tablet (5 mg total) by mouth at bedtime. (Patient not taking: No  sig reported) 90 tablet 3   [DISCONTINUED] venlafaxine XR (EFFEXOR-XR) 150 MG 24 hr capsule TAKE 1 CAPSULE BY MOUTH  EVERY DAY WITH BREAKFAST (Patient not taking: No sig reported) 90 capsule 3   No current facility-administered medications on file prior to visit.        ROS:  All others reviewed and negative.  Objective        PE:  BP 138/86 (BP Location: Left Arm, Patient Position: Sitting, Cuff Size: Large)   Pulse 91   Temp 98.5 F (36.9 C) (Oral)   Ht '5\' 1"'$  (1.549 m)   Wt 153 lb (69.4 kg)   SpO2 93%   BMI 28.91 kg/m                 Constitutional: Pt appears in NAD               HENT: Head: NCAT.                Right Ear: External ear normal.                 Left Ear: External ear normal.                Eyes: . Pupils are equal, round, and reactive to light. Conjunctivae and EOM are normal               Nose: without d/c or deformity               Neck: Neck supple. Gross normal ROM               Cardiovascular: Normal rate and regular rhythm.                 Pulmonary/Chest: Effort normal and breath sounds without rales or wheezing.                Abd:  Soft, NT, ND, + BS, no organomegaly               Neurological: Pt is alert. At baseline orientation, motor grossly intact with PD UE involuntary movements               Skin:  LE edema - none, has multiple wheal and flare lesions to her extremities and torso               Psychiatric: Pt behavior is normal without agitation   Micro: none  Cardiac tracings I have  personally interpreted today:  none  Pertinent Radiological findings (summarize): none   Lab Results  Component Value Date   WBC 8.1 02/21/2021   HGB 12.4 02/21/2021   HCT 38.9 02/21/2021   PLT 289 02/21/2021   GLUCOSE 92 05/14/2021   CHOL 200 (H) 01/07/2021   TRIG 53 01/07/2021   HDL 79 01/07/2021   LDLDIRECT 109.8 02/10/2010   LDLCALC 111 (H) 01/07/2021   ALT 6 05/14/2021   AST 14 05/14/2021   NA 143 05/14/2021   K 3.9 05/14/2021   CL 104 05/14/2021   CREATININE 0.95 05/14/2021   BUN 12 05/14/2021   CO2 34 (H) 05/14/2021   TSH 0.67 06/25/2020   INR 0.97 02/07/2014   HGBA1C 5.8 05/14/2021   Assessment/Plan:  Mallory Klein is a 76 y.o. Black or African American [2] female with  has a past medical history of Abnormal CBC (02/05/2015), Adjustment disorder with mixed anxiety and depressed mood (02/15/2007), Asthma, Diverticulosis of colon (without mention of hemorrhage), Esophageal  stricture, Essential hypertension (01/26/2007), Falls, GERD (gastroesophageal reflux disease) (02/15/2007), Heart murmur, Hyperglycemia (03/02/2011), Insomnia disorder (12/08/2018), Internal hemorrhoids without mention of complication, Iron deficiency anemia (04/23/2017), Mild neurocognitive disorder, likely due to Parkinson's disease (02/09/2019), OSA (obstructive sleep apnea) (02/14/2010), Osteoarthritis, Parkinson disease ([place]) (02/09/2014), Postherpetic trigeminal neuralgia (01/26/2007), Shortness of breath dyspnea, Thrombocytosis (05/24/2017), Urinary incontinence (01/26/2007), and Vitamin D deficiency (01/26/2019).  Hives New osnet x 2-3 days, etiology unclear but seems unlikely any of her medication since she states she is not taking her meds; ok for depomedrol IM 80 mg qd, prednisone taper and benadryl 50 mg q 6 prn, pt declines allergy referral for now  Parkinson disease ([place]) Pt with worsening PD movements and higher risk for fall though none recent per pt and husband; for mirapex restart,  continue walker and f/u Dr Rexene Alberts as planned  Non compliance w medication regimen Pt advised stopping all meds is ill advised, and should f/u with PCP regarding med review to see if any less intensive regimen can be established  Essential hypertension  BP Readings from Last 3 Encounters:  09/24/21 138/86  07/31/21 140/80  06/19/21 138/78   Stable, pt to restart med regimen hct 12.5 mg though may wish to consider different antiBP med as diuretics more associated with falling  Followup: Return if symptoms worsen or fail to improve.  Cathlean Cower, MD 09/25/2021 8:30 PM [facility]

## 2021-09-24 NOTE — Patient Instructions (Signed)
You had the steroid shot today  Please take all new medication as prescribed - the prednisone  Please restart your medications  Please continue all other medications as before, and refills have been done if requested.  Please have the pharmacy call with any other refills you may need.  Please continue your efforts at being more active, low cholesterol diet, and weight control.  Please keep your appointments with your specialists as you may have planned - Neurology soon

## 2021-09-25 ENCOUNTER — Encounter: Payer: Self-pay | Admitting: Internal Medicine

## 2021-09-25 ENCOUNTER — Telehealth: Payer: Self-pay | Admitting: Internal Medicine

## 2021-09-25 DIAGNOSIS — M159 Polyosteoarthritis, unspecified: Secondary | ICD-10-CM

## 2021-09-25 DIAGNOSIS — G20A1 Parkinson's disease without dyskinesia, without mention of fluctuations: Secondary | ICD-10-CM

## 2021-09-25 DIAGNOSIS — R269 Unspecified abnormalities of gait and mobility: Secondary | ICD-10-CM

## 2021-09-25 DIAGNOSIS — G8929 Other chronic pain: Secondary | ICD-10-CM

## 2021-09-25 DIAGNOSIS — G20 Parkinson's disease: Secondary | ICD-10-CM

## 2021-09-25 DIAGNOSIS — M150 Primary generalized (osteo)arthritis: Secondary | ICD-10-CM

## 2021-09-25 DIAGNOSIS — Z91148 Patient's other noncompliance with medication regimen for other reason: Secondary | ICD-10-CM | POA: Insufficient documentation

## 2021-09-25 NOTE — Assessment & Plan Note (Signed)
Pt with worsening PD movements and higher risk for fall though none recent per pt and husband; for mirapex restart, continue walker and f/u Dr Rexene Alberts as planned

## 2021-09-25 NOTE — Assessment & Plan Note (Signed)
Pt advised stopping all meds is ill advised, and should f/u with PCP regarding med review to see if any less intensive regimen can be established

## 2021-09-25 NOTE — Assessment & Plan Note (Signed)
  BP Readings from Last 3 Encounters:  09/24/21 138/86  07/31/21 140/80  06/19/21 138/78   Stable, pt to restart med regimen hct 12.5 mg though may wish to consider different antiBP med as diuretics more associated with falling

## 2021-09-25 NOTE — Telephone Encounter (Signed)
Patient would like to have an order for a rollator.  Please advise

## 2021-09-25 NOTE — Assessment & Plan Note (Signed)
New osnet x 2-3 days, etiology unclear but seems unlikely any of her medication since she states she is not taking her meds; ok for depomedrol IM 80 mg qd, prednisone taper and benadryl 50 mg q 6 prn, pt declines allergy referral for now

## 2021-09-26 ENCOUNTER — Telehealth: Payer: Self-pay | Admitting: Internal Medicine

## 2021-09-26 NOTE — Telephone Encounter (Signed)
Pt is requesting a call to go over her medication list. She stated she is unsure of what meds she is supposed to be currently taking.   Please advise.

## 2021-09-26 NOTE — Telephone Encounter (Signed)
Called pt back she states she has so many meds she don't know if she need to take them or not. She wanting to go over meds and if she don't have to take she want. Inform pt she will need an appt w/ Dr. Alain Marion. Made appt for 10/08/21 @ 3:40.Marland KitchenJohny Chess

## 2021-09-28 NOTE — Telephone Encounter (Signed)
Mallory Klein, please print a DME for Mallory Klein walker Rx Thanks

## 2021-09-29 ENCOUNTER — Telehealth: Payer: Self-pay | Admitting: Internal Medicine

## 2021-09-29 NOTE — Telephone Encounter (Signed)
Pt is requesting a transfer of care from Dr. Alain Marion to Dr. Jenny Reichmann. Pt stated Dr. Jenny Reichmann has more availability and she liked the care he provided her when she saw him on 09/24/21.  Would this be ok?

## 2021-09-29 NOTE — Telephone Encounter (Signed)
Ok with me 

## 2021-09-29 NOTE — Telephone Encounter (Signed)
Printed rx for [employer]. Called pt she states she need a Lift Chair to help her get up not a rollator. Will pick-up on Friday at [facility]...[name]

## 2021-09-30 NOTE — Telephone Encounter (Signed)
Okay with me.  Thank you °

## 2021-10-03 ENCOUNTER — Ambulatory Visit (INDEPENDENT_AMBULATORY_CARE_PROVIDER_SITE_OTHER): Payer: Medicare Other | Admitting: Internal Medicine

## 2021-10-03 ENCOUNTER — Encounter: Payer: Self-pay | Admitting: Internal Medicine

## 2021-10-03 VITALS — BP 158/76 | HR 89 | Ht 61.0 in | Wt 151.6 lb

## 2021-10-03 DIAGNOSIS — R269 Unspecified abnormalities of gait and mobility: Secondary | ICD-10-CM | POA: Diagnosis not present

## 2021-10-03 DIAGNOSIS — N3281 Overactive bladder: Secondary | ICD-10-CM

## 2021-10-03 DIAGNOSIS — E559 Vitamin D deficiency, unspecified: Secondary | ICD-10-CM | POA: Diagnosis not present

## 2021-10-03 DIAGNOSIS — I10 Essential (primary) hypertension: Secondary | ICD-10-CM | POA: Diagnosis not present

## 2021-10-03 DIAGNOSIS — Z91148 Patient's other noncompliance with medication regimen for other reason: Secondary | ICD-10-CM | POA: Diagnosis not present

## 2021-10-03 DIAGNOSIS — G20 Parkinson's disease: Secondary | ICD-10-CM

## 2021-10-03 DIAGNOSIS — L509 Urticaria, unspecified: Secondary | ICD-10-CM | POA: Diagnosis not present

## 2021-10-03 DIAGNOSIS — F419 Anxiety disorder, unspecified: Secondary | ICD-10-CM

## 2021-10-03 DIAGNOSIS — G20A1 Parkinson's disease without dyskinesia, without mention of fluctuations: Secondary | ICD-10-CM

## 2021-10-03 DIAGNOSIS — E538 Deficiency of other specified B group vitamins: Secondary | ICD-10-CM | POA: Diagnosis not present

## 2021-10-03 MED ORDER — QUETIAPINE FUMARATE 100 MG PO TABS: 100.0000 mg | ORAL_TABLET | Freq: Every day | ORAL | 3 refills | Status: DC

## 2021-10-03 MED ORDER — ROSUVASTATIN CALCIUM 10 MG PO TABS: 10.0000 mg | ORAL_TABLET | Freq: Every day | ORAL | 3 refills | Status: DC

## 2021-10-03 MED ORDER — SOLIFENACIN SUCCINATE 5 MG PO TABS: 5.0000 mg | ORAL_TABLET | Freq: Every day | ORAL | 3 refills | Status: DC

## 2021-10-03 MED ORDER — HYDROXYZINE PAMOATE 50 MG PO CAPS: 50.0000 mg | ORAL_CAPSULE | Freq: Four times a day (QID) | ORAL | 5 refills | Status: DC | PRN

## 2021-10-03 MED ORDER — CYANOCOBALAMIN 1000 MCG/ML IJ SOLN: INTRAMUSCULAR | 3 refills | Status: DC

## 2021-10-03 MED ORDER — HYDROCHLOROTHIAZIDE 12.5 MG PO CAPS: 12.5000 mg | ORAL_CAPSULE | Freq: Every day | ORAL | 3 refills | Status: DC

## 2021-10-03 MED ORDER — PANTOPRAZOLE SODIUM 40 MG PO TBEC: 40.0000 mg | DELAYED_RELEASE_TABLET | Freq: Every day | ORAL | 3 refills | Status: DC

## 2021-10-03 MED ORDER — CELECOXIB 200 MG PO CAPS
200.0000 mg | ORAL_CAPSULE | Freq: Two times a day (BID) | ORAL | 2 refills | Status: DC | PRN
Start: 2021-10-03 — End: 2021-10-21

## 2021-10-03 NOTE — Progress Notes (Unsigned)
Patient ID: Mallory Klein, female   DOB: [birth year], 76 y.o.   MRN: [id number]        Chief Complaint: follow up recent worsening gait difficulty in the setting of Parkinsons, htn, anxiety       HPI:  Mallory Klein is a 76 y.o. female here in [facility] with new PCP; Pt denies chest pain, increased sob or doe, wheezing, orthopnea, PND, increased LE swelling, palpitations, dizziness or syncope.   Pt denies polydipsia, polyuria, or new focal neuro s/s.    Pt denies fever, wt loss, night sweats, loss of appetite, or other constitutional symptoms  Has been out of BP med or over 1 week, willing to restart.  Has not had orthostasis or falls due to this.  But has been increased gait stability and generalized weakness, asking for lift chair at home and home PT.  Denies worsening depressive symptoms, suicidal ideation, or panic; has ongoing anxiety as well.  Has walker with her today   Recent Hives resolved.  Today needs meds updated at d/c with an explanation for each as she is now afraid to take any of it.  Does not want to take oxybutinin de to risk of worsening cognitive function.        Wt Readings from Last 3 Encounters:  10/03/21 151 lb 9.6 oz (68.8 kg)  09/24/21 153 lb (69.4 kg)  07/31/21 172 lb (78 kg)   BP Readings from Last 3 Encounters:  10/03/21 (!) 158/76  09/24/21 138/86  07/31/21 140/80         Past Medical History:  Diagnosis Date   Abnormal CBC 02/05/2015   [name] 12/16 new: normocytic anemia and thrombocytosis   Adjustment disorder with mixed anxiety and depressed mood 02/15/2007   Chronic  Chronic pain Grief, stress Effexor XR  Dad died in 02/13/23   Asthma    Diverticulosis of colon (without mention of hemorrhage)    Esophageal stricture    Essential hypertension 01/26/2007   Chronic Verapamil    Falls    "I blackout"   GERD (gastroesophageal reflux disease) 02/15/2007   Chronic     Heart murmur    Hyperglycemia 03/02/2011   Mild     Insomnia disorder 12/08/2018    10/20 Carbid/Lev dose was increased - c/o hard time falling asleep (6 am) getting up at 12 am, poor sleep. Try Temazepam 15-30 mg at 11-1:30 pm    Internal hemorrhoids without mention of complication    Iron deficiency anemia 04/23/2017   Mild neurocognitive disorder, likely due to Parkinson's disease 02/09/2019   OSA (obstructive sleep apnea) 02/14/2010   In lab study (May 2018): AHI 25. Only had 45 minutes of sleep secondary to frequent awakenings secondary to sleep apnea. autocpap 5-15 cm water.    Osteoarthritis    Parkinson disease ([place]) Feb 12, 2014   2015 2017  Primidone - d/c 2019 Parkinson's: [facility] IR. Dr Tat   Postherpetic trigeminal neuralgia 01/26/2007   Qualifier: Diagnosis of  By: Marca Ancona RMA, [name]     Shortness of breath dyspnea    with allergies   Thrombocytosis 05/24/2017   Urinary incontinence 01/26/2007   Chronic  10/17 Detrol LA    Vitamin D deficiency 01/26/2019   Past Surgical History:  Procedure Laterality Date   ABDOMINAL HYSTERECTOMY     BREAST CYST EXCISION Left 1980's   LUMBAR LAMINECTOMY/DECOMPRESSION MICRODISCECTOMY Bilateral 02/20/2014   Procedure: LUMBAR TWO THREE, LUMBAR THREE FOUR LUMBAR LAMINECTOMY/DECOMPRESSION MICRODISCECTOMY 2 LEVELS;  Surgeon: Eustace Moore,  MD;  Location: [place];  Service: Neurosurgery;  Laterality: Bilateral;   SPLENECTOMY     TIBIA FRACTURE SURGERY Right    TIBIA FRACTURE SURGERY Left    TOTAL KNEE ARTHROPLASTY Right 2013    reports that she has never smoked. She has never used smokeless tobacco. She reports current alcohol use of about 2.0 standard drinks of alcohol per week. She reports that she does not use drugs. family history includes Asthma in her mother; Dementia in her mother; Diabetes in her mother; Diabetes type II in her mother; Hypertension in her mother; Mental illness in her father; Prostate cancer in her father; Stroke in her brother; Suicidality in her son. Allergies  Allergen Reactions   Ace Inhibitors      Patient doesn't recall reaction.  jkl   Benicar [Olmesartan]     It made her sick   Aspirin Other (See Comments)    bruising   Citalopram Hydrobromide Diarrhea   Current Outpatient Medications on File Prior to Visit  Medication Sig Dispense Refill   buprenorphine (BUTRANS) 10 MCG/HR PTWK 1 patch once a week.     cetirizine (ZYRTEC) 10 MG tablet TAKE 1 TABLET BY MOUTH EVERY DAY 90 tablet 3   Cholecalciferol (VITAMIN D3) 50 MCG (2000 UT) capsule TAKE 1 CAPSULE BY MOUTH EVERY DAY 100 capsule 3   CVS SENNA PLUS 8.6-50 MG tablet Take 1 tablet by mouth 2 (two) times daily.     diclofenac Sodium (VOLTAREN) 1 % GEL Apply 4 g topically daily as needed (pain).     docusate sodium (COLACE) 100 MG capsule Take 100 mg by mouth at bedtime.     polyethylene glycol (MIRALAX / GLYCOLAX) 17 g packet Take 17 g by mouth daily as needed for mild constipation.     pramipexole (MIRAPEX) 0.75 MG tablet Take 1 tablet (0.75 mg total) by mouth 3 (three) times daily. 270 tablet 3   [DISCONTINUED] donepezil (ARICEPT) 5 MG tablet Take 1 tablet (5 mg total) by mouth at bedtime. (Patient not taking: No sig reported) 90 tablet 3   [DISCONTINUED] venlafaxine XR (EFFEXOR-XR) 150 MG 24 hr capsule TAKE 1 CAPSULE BY MOUTH  EVERY DAY WITH BREAKFAST (Patient not taking: No sig reported) 90 capsule 3   No current facility-administered medications on file prior to visit.        ROS:  All others reviewed and negative.  Objective        PE:  BP (!) 158/76 (BP Location: Left Arm, Patient Position: Sitting, Cuff Size: Normal)   Pulse 89   Ht '5\' 1"'$  (1.549 m)   Wt 151 lb 9.6 oz (68.8 kg)   SpO2 98%   BMI 28.64 kg/m                 Constitutional: Pt appears in NAD               HENT: Head: NCAT.                Right Ear: External ear normal.                 Left Ear: External ear normal.                Eyes: . Pupils are equal, round, and reactive to light. Conjunctivae and EOM are normal               Nose: without d/c or  deformity  Neck: Neck supple. Gross normal ROM               Cardiovascular: Normal rate and regular rhythm.                 Pulmonary/Chest: Effort normal and breath sounds without rales or wheezing.                Abd:  Soft, NT, ND, + BS, no organomegaly               Neurological: Pt is alert. At baseline orientation, motor grossly intact but with involunatary PT movements mild UEs               Skin: Skin is warm. No rashes, no other new lesions, LE edema - none               Psychiatric: Pt behavior is normal without agitation   Micro: none  Cardiac tracings I have personally interpreted today:  none  Pertinent Radiological findings (summarize): none   Lab Results  Component Value Date   WBC 8.1 02/21/2021   HGB 12.4 02/21/2021   HCT 38.9 02/21/2021   PLT 289 02/21/2021   GLUCOSE 92 05/14/2021   CHOL 200 (H) 01/07/2021   TRIG 53 01/07/2021   HDL 79 01/07/2021   LDLDIRECT 109.8 02/10/2010   LDLCALC 111 (H) 01/07/2021   ALT 6 05/14/2021   AST 14 05/14/2021   NA 143 05/14/2021   K 3.9 05/14/2021   CL 104 05/14/2021   CREATININE 0.95 05/14/2021   BUN 12 05/14/2021   CO2 34 (H) 05/14/2021   TSH 0.67 06/25/2020   INR 0.97 02/07/2014   HGBA1C 5.8 05/14/2021   Assessment/Plan:  Mallory Klein is a 76 y.o. Black or African American [2] female with  has a past medical history of Abnormal CBC (02/05/2015), Adjustment disorder with mixed anxiety and depressed mood (02/15/2007), Asthma, Diverticulosis of colon (without mention of hemorrhage), Esophageal stricture, Essential hypertension (01/26/2007), Falls, GERD (gastroesophageal reflux disease) (02/15/2007), Heart murmur, Hyperglycemia (03/02/2011), Insomnia disorder (12/08/2018), Internal hemorrhoids without mention of complication, Iron deficiency anemia (04/23/2017), Mild neurocognitive disorder, likely due to Parkinson's disease (02/09/2019), OSA (obstructive sleep apnea) (02/14/2010), Osteoarthritis, Parkinson  disease ([place]) (02/09/2014), Postherpetic trigeminal neuralgia (01/26/2007), Shortness of breath dyspnea, Thrombocytosis (05/24/2017), Urinary incontinence (01/26/2007), and Vitamin D deficiency (01/26/2019).  Parkinson disease ([place]) Chronic  - pt encouraged to restart mirapex and f/u neurology as planned  Essential hypertension BP Readings from Last 3 Encounters:  10/03/21 (!) 158/76  09/24/21 138/86  07/31/21 140/80   uncontrolled, pt to restart medical treatment hct 12.5 mg qd   Gait disorder Worsening, has walker, now for [facility] with PT and also lift chair  Hives Resolved, no need further steroid or other tx at this time  Non compliance w medication regimen Medication list and purpose of meds written on the list is explained and given to pt today, hopefully to encourage her restart and maintaining compliance  Vitamin D deficiency Last vitamin D Lab Results  Component Value Date   VD25OH 32.49 06/12/2019   Low, reminded to restart oral replacement   B12 deficiency Lab Results  Component Value Date   [id number] 376 (H) 07/18/2020   Stable, cont oral replacement - b12 1000 mcg IM monthly shots  Anxiety Mild worsening, declines referral for counseling or SSRI, ok for hydroxyzine prn  OAB (overactive bladder) Ok for change oxybutinin to vesicare 5 mg qd  Followup: No follow-ups on file.  Cathlean Cower, MD 10/05/2021 3:18 PM [facility]

## 2021-10-03 NOTE — Patient Instructions (Addendum)
Please take all new medication as prescribed  - the vesicare 5 mg per day for the overactive bladder  Please continue all other medications as before, and refills have been done if requested for all other meds, except the pain patch and the mirapex by your specialists.  Please have the pharmacy call with any other refills you may need.  Please continue your efforts at being more active, low cholesterol diet, and weight control.  Please keep your appointments with your specialists as you may have planned  You are given the form for the Lift Chair today to give to the Home health agency when they start coming, hopefully soon  Please make an Appointment to return in 3 months, or sooner if needed

## 2021-10-05 DIAGNOSIS — F419 Anxiety disorder, unspecified: Secondary | ICD-10-CM | POA: Insufficient documentation

## 2021-10-05 DIAGNOSIS — N3281 Overactive bladder: Secondary | ICD-10-CM | POA: Insufficient documentation

## 2021-10-05 NOTE — Assessment & Plan Note (Signed)
Last vitamin D Lab Results  Component Value Date   VD25OH 32.49 06/12/2019   Low, reminded to restart oral replacement

## 2021-10-05 NOTE — Assessment & Plan Note (Signed)
Chronic  - pt encouraged to restart mirapex and f/u neurology as planned

## 2021-10-05 NOTE — Assessment & Plan Note (Signed)
Worsening, has walker, now for [facility] with PT and also lift chair

## 2021-10-05 NOTE — Assessment & Plan Note (Signed)
Lab Results  Component Value Date   [id number] 601 (H) 07/18/2020   Stable, cont oral replacement - b12 1000 mcg IM monthly shots

## 2021-10-05 NOTE — Assessment & Plan Note (Signed)
Resolved, no need further steroid or other tx at this time

## 2021-10-05 NOTE — Assessment & Plan Note (Signed)
Ok for change oxybutinin to vesicare 5 mg qd

## 2021-10-05 NOTE — Assessment & Plan Note (Signed)
Mild worsening, declines referral for counseling or SSRI, ok for hydroxyzine prn

## 2021-10-05 NOTE — Assessment & Plan Note (Signed)
BP Readings from Last 3 Encounters:  10/03/21 (!) 158/76  09/24/21 138/86  07/31/21 140/80   uncontrolled, pt to restart medical treatment hct 12.5 mg qd

## 2021-10-05 NOTE — Assessment & Plan Note (Signed)
Medication list and purpose of meds written on the list is explained and given to pt today, hopefully to encourage her restart and maintaining compliance

## 2021-10-08 ENCOUNTER — Ambulatory Visit: Payer: Medicare Other | Admitting: Internal Medicine

## 2021-10-08 ENCOUNTER — Telehealth: Payer: Self-pay | Admitting: Internal Medicine

## 2021-10-08 ENCOUNTER — Telehealth: Payer: Self-pay

## 2021-10-08 NOTE — Telephone Encounter (Signed)
Please advise and let me know what I can do or is there anything I need to call Levada Dy and ask her?

## 2021-10-08 NOTE — Telephone Encounter (Signed)
Levada Dy called to get a status update on lift chair for [place]. Advised rx was available for pickup.    Fyi

## 2021-10-08 NOTE — Telephone Encounter (Signed)
I have not seen this beofre.  Ok to try the PA I suppose but otherwise we would need to let them know the services are denied

## 2021-10-08 NOTE — Telephone Encounter (Signed)
Pt husband states that he will be coming in to pick up the Rx for the Lift chair.   Paperwork is in front office ready.

## 2021-10-08 NOTE — Telephone Encounter (Signed)
Mallory Klein is calling to advise Dr. Jenny Reichmann that a PA is needed for the [facility] services before it will be approved.  Please advise

## 2021-10-17 ENCOUNTER — Other Ambulatory Visit: Payer: Self-pay | Admitting: Internal Medicine

## 2021-10-21 ENCOUNTER — Ambulatory Visit: Payer: Medicare Other | Admitting: Neurology

## 2021-10-21 ENCOUNTER — Telehealth: Payer: Self-pay | Admitting: Internal Medicine

## 2021-10-21 IMAGING — CR DG THORACIC SPINE 2V
2 series · 2 of 2 positions shown · non-contrast
Comparison: [DATE]

CLINICAL DATA: Fall

EXAM:
THORACIC SPINE 2 VIEWS

[t-spine lat]
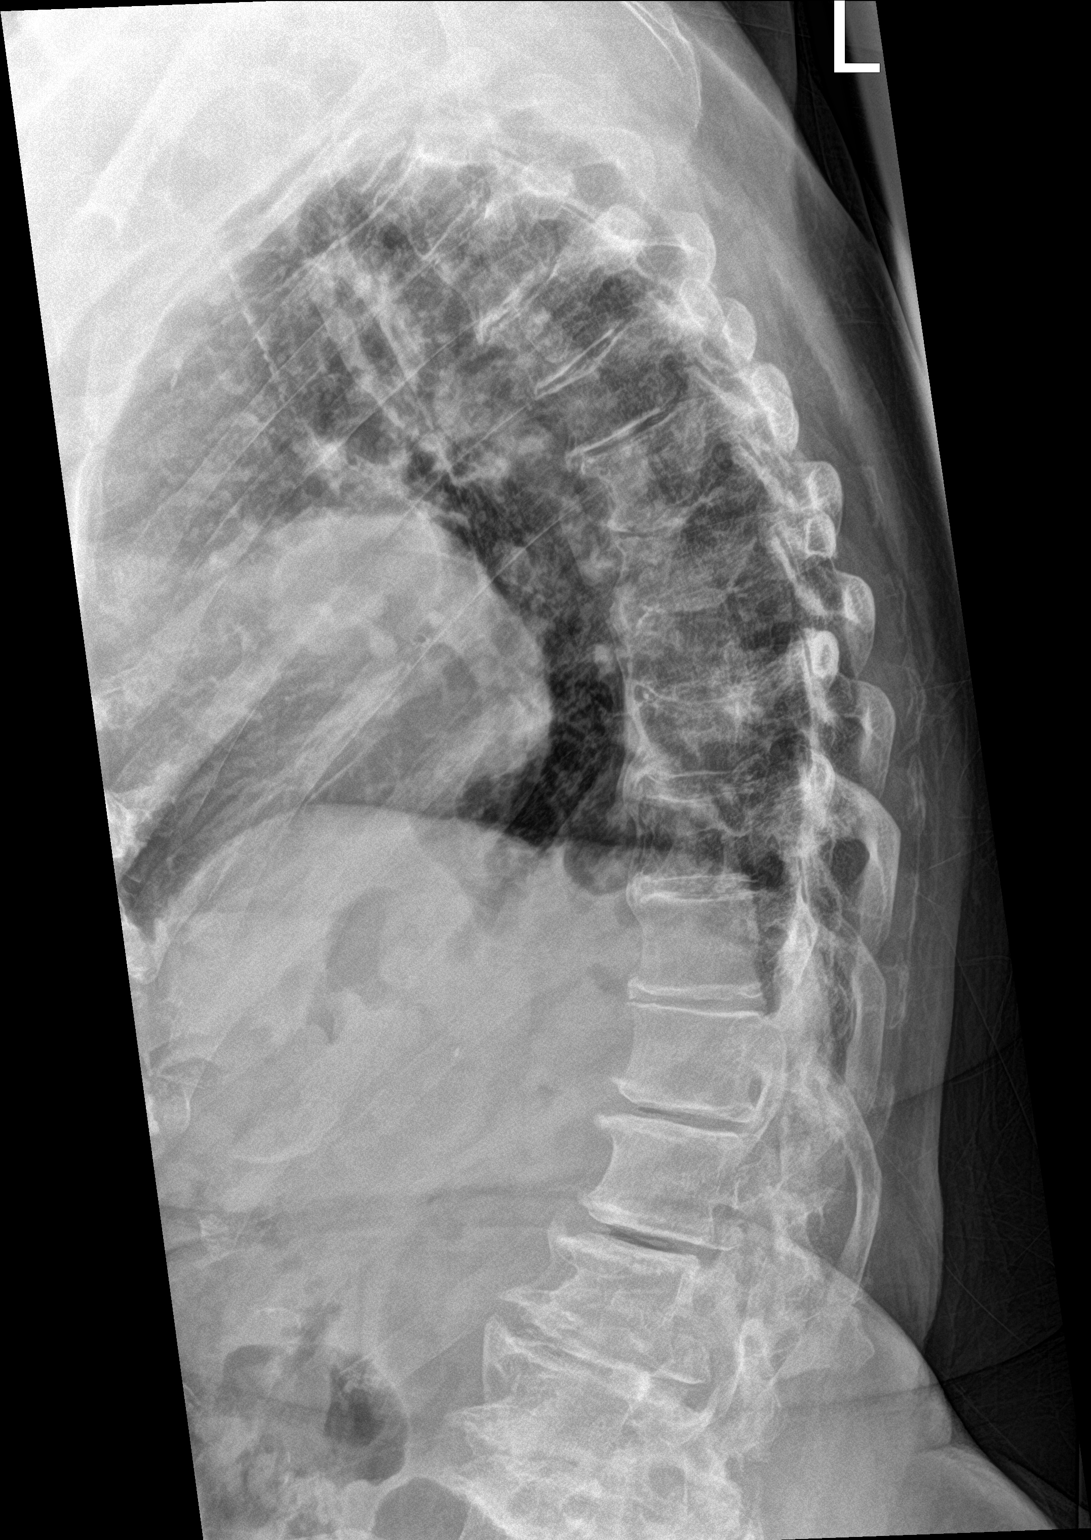

[t-spine ap]
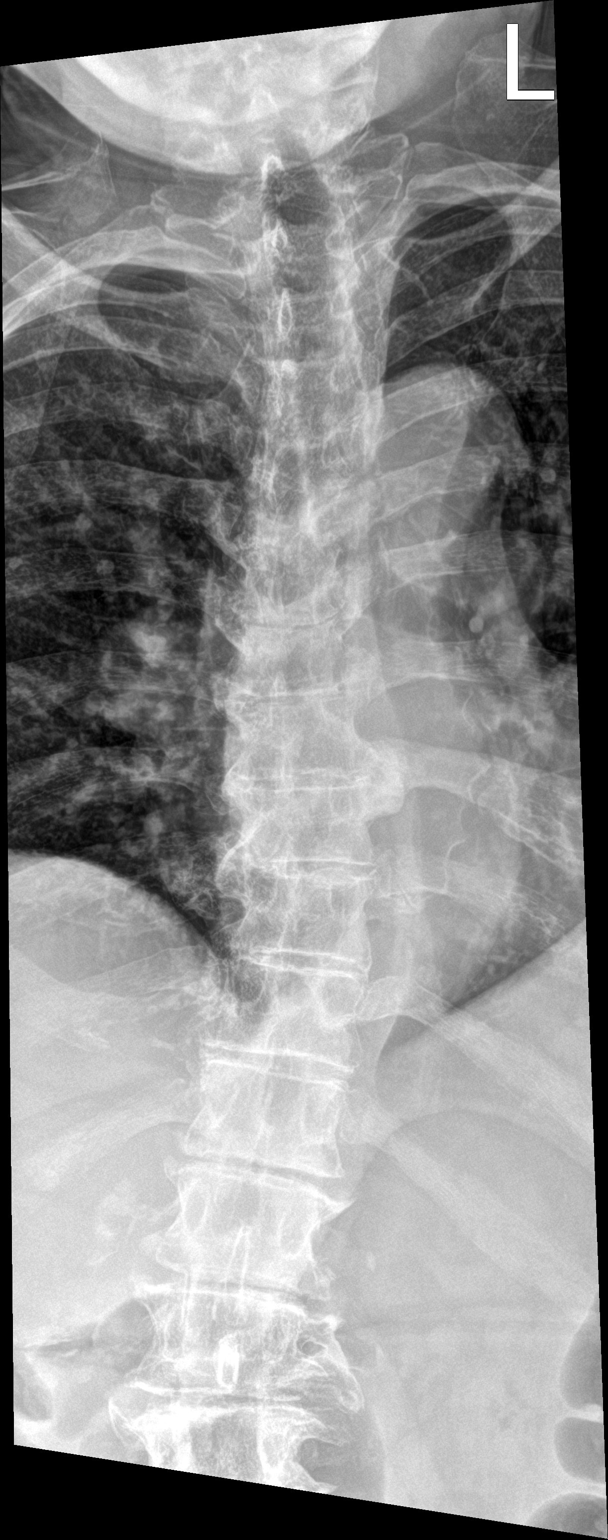

[2 of 2 positions shown; findings below may reference images not displayed]

FINDINGS: Diffuse degenerative changes with disc space narrowing and spurring.
Normal alignment. No fracture or focal bone lesion.
IMPRESSION: Degenerative changes.  No acute bony abnormality.

## 2021-10-21 MED ORDER — CELECOXIB 200 MG PO CAPS: 200.0000 mg | ORAL_CAPSULE | Freq: Two times a day (BID) | ORAL | 2 refills | Status: DC | PRN

## 2021-10-21 NOTE — Telephone Encounter (Signed)
This was already done aug 11 but I resent it to [facility]

## 2021-10-21 NOTE — Telephone Encounter (Signed)
Patient would like to get her celebrex refilled - please send to [facility] [place] [place] - patient states that she is out of medication and in pain.

## 2021-10-23 NOTE — Telephone Encounter (Signed)
Pt is needing the Lift chair Rx sent to a [facility] supply [facility]. She doesn't have a clue what to do with the Rx that was pick.   Please advise

## 2021-10-24 NOTE — Telephone Encounter (Signed)
I totally understand and I thank you for looking into this.

## 2021-10-24 NOTE — Telephone Encounter (Signed)
Is there a referral needed for this, please advise.

## 2021-10-25 DIAGNOSIS — G3184 Mild cognitive impairment, so stated: Secondary | ICD-10-CM | POA: Diagnosis not present

## 2021-10-25 DIAGNOSIS — I10 Essential (primary) hypertension: Secondary | ICD-10-CM | POA: Diagnosis not present

## 2021-10-25 DIAGNOSIS — G20 Parkinson's disease: Secondary | ICD-10-CM | POA: Diagnosis not present

## 2021-10-25 DIAGNOSIS — M1990 Unspecified osteoarthritis, unspecified site: Secondary | ICD-10-CM | POA: Diagnosis not present

## 2021-10-25 DIAGNOSIS — J45909 Unspecified asthma, uncomplicated: Secondary | ICD-10-CM | POA: Diagnosis not present

## 2021-10-28 ENCOUNTER — Telehealth: Payer: Self-pay | Admitting: Internal Medicine

## 2021-10-28 NOTE — Telephone Encounter (Signed)
[facility] needs a verbal on Nursing - 1 x week for 3 weeks

## 2021-10-29 NOTE — Telephone Encounter (Signed)
Ok for verbals 

## 2021-10-30 NOTE — Telephone Encounter (Signed)
Notified Katie at [facility] of verbal orders.

## 2021-10-31 ENCOUNTER — Telehealth: Payer: Self-pay | Admitting: Internal Medicine

## 2021-10-31 DIAGNOSIS — G20 Parkinson's disease: Secondary | ICD-10-CM | POA: Diagnosis not present

## 2021-10-31 DIAGNOSIS — M1990 Unspecified osteoarthritis, unspecified site: Secondary | ICD-10-CM | POA: Diagnosis not present

## 2021-10-31 DIAGNOSIS — G3184 Mild cognitive impairment, so stated: Secondary | ICD-10-CM | POA: Diagnosis not present

## 2021-10-31 DIAGNOSIS — J45909 Unspecified asthma, uncomplicated: Secondary | ICD-10-CM | POA: Diagnosis not present

## 2021-10-31 DIAGNOSIS — I10 Essential (primary) hypertension: Secondary | ICD-10-CM | POA: Diagnosis not present

## 2021-10-31 NOTE — Telephone Encounter (Signed)
Mallory Klein from [place] is requesting new verbal orders for home physical therapy for: 1X for 1 week  2X for 3 weeks 1X for 3 weeks   To improve strength, flexibility, balance, and safe transfers.   To confirm please call Mallory Klein:  [phone]  Beckie Busing also asked about a DME for an RX for a chairlift to be sent to [facility]. In a previous telephone note (10-08-21) it was stated that the RX was here for the pt's husband to pick up. There is no PW up front for the pt, please advise on how to proceed.

## 2021-11-03 NOTE — Telephone Encounter (Signed)
Ok for verbals 

## 2021-11-03 NOTE — Telephone Encounter (Signed)
Please advise for verbal orders 

## 2021-11-05 ENCOUNTER — Encounter: Payer: Self-pay | Admitting: Internal Medicine

## 2021-11-05 ENCOUNTER — Ambulatory Visit (INDEPENDENT_AMBULATORY_CARE_PROVIDER_SITE_OTHER): Payer: Medicare Other | Admitting: Internal Medicine

## 2021-11-05 VITALS — BP 148/80 | HR 81 | Temp 98.2°F | Ht 61.0 in | Wt 146.8 lb

## 2021-11-05 DIAGNOSIS — Z23 Encounter for immunization: Secondary | ICD-10-CM

## 2021-11-05 DIAGNOSIS — E538 Deficiency of other specified B group vitamins: Secondary | ICD-10-CM

## 2021-11-05 DIAGNOSIS — R21 Rash and other nonspecific skin eruption: Secondary | ICD-10-CM

## 2021-11-05 DIAGNOSIS — M159 Polyosteoarthritis, unspecified: Secondary | ICD-10-CM | POA: Diagnosis not present

## 2021-11-05 DIAGNOSIS — G8929 Other chronic pain: Secondary | ICD-10-CM | POA: Diagnosis not present

## 2021-11-05 DIAGNOSIS — M5440 Lumbago with sciatica, unspecified side: Secondary | ICD-10-CM | POA: Diagnosis not present

## 2021-11-05 MED ORDER — METHYLPREDNISOLONE 4 MG PO TBPK: ORAL_TABLET | ORAL | 0 refills | Status: DC

## 2021-11-05 MED ORDER — METHYLPREDNISOLONE ACETATE 80 MG/ML IJ SUSP
80.0000 mg | Freq: Once | INTRAMUSCULAR | Status: AC
  Administered 2021-11-05: 80 mg via INTRAMUSCULAR

## 2021-11-05 MED ORDER — CETIRIZINE HCL 10 MG PO TABS
10.0000 mg | ORAL_TABLET | Freq: Every day | ORAL | 3 refills | Status: DC
Start: 2021-11-05 — End: 2022-09-21

## 2021-11-05 NOTE — Addendum Note (Signed)
Addended by: Earnstine Regal on: 11/05/2021 03:32 PM   Modules accepted: Orders

## 2021-11-05 NOTE — Assessment & Plan Note (Signed)
On Celebrex

## 2021-11-05 NOTE — Assessment & Plan Note (Addendum)
On B12 

## 2021-11-05 NOTE — Progress Notes (Signed)
Subjective:  Patient ID: Mallory Klein, female    DOB: 1945-09-17  Age: 76 y.o. MRN: [id number]  CC: Follow-up (3 month f/u)   HPI Mallory Klein presents for rash on the L arm - same as in 09/2021  Outpatient Medications Prior to Visit  Medication Sig Dispense Refill   buprenorphine (BUTRANS) 10 MCG/HR PTWK 1 patch once a week.     celecoxib (CELEBREX) 200 MG capsule Take 1 capsule (200 mg total) by mouth 2 (two) times daily as needed for moderate pain. 60 capsule 2   Cholecalciferol (VITAMIN D3) 50 MCG (2000 UT) capsule TAKE 1 CAPSULE BY MOUTH EVERY DAY 100 capsule 3   [facility] SENNA PLUS 8.6-50 MG tablet Take 1 tablet by mouth 2 (two) times daily.     cyanocobalamin (VITAMIN B12) 1000 MCG/ML injection INJECT 1 ML INTO THE SKIN EVERY 14 DAYS 2 mL 19   diclofenac Sodium (VOLTAREN) 1 % GEL Apply 4 g topically daily as needed (pain).     docusate sodium (COLACE) 100 MG capsule Take 100 mg by mouth at bedtime.     hydrochlorothiazide (MICROZIDE) 12.5 MG capsule Take 1 capsule (12.5 mg total) by mouth daily. 90 capsule 3   hydrOXYzine (VISTARIL) 50 MG capsule Take 1-2 capsules (50-100 mg total) by mouth every 6 (six) hours as needed for anxiety. 60 capsule 5   pantoprazole (PROTONIX) 40 MG tablet Take 1 tablet (40 mg total) by mouth daily. 90 tablet 3   polyethylene glycol (MIRALAX / GLYCOLAX) 17 g packet Take 17 g by mouth daily as needed for mild constipation.     pramipexole (MIRAPEX) 0.75 MG tablet Take 1 tablet (0.75 mg total) by mouth 3 (three) times daily. 270 tablet 3   QUEtiapine (SEROQUEL) 100 MG tablet Take 1 tablet (100 mg total) by mouth at bedtime. 90 tablet 3   rosuvastatin (CRESTOR) 10 MG tablet Take 1 tablet (10 mg total) by mouth daily. 90 tablet 3   solifenacin (VESICARE) 5 MG tablet Take 1 tablet (5 mg total) by mouth daily. 90 tablet 3   cetirizine (ZYRTEC) 10 MG tablet TAKE 1 TABLET BY MOUTH EVERY DAY 90 tablet 3   No facility-administered medications prior to  visit.    ROS: Review of Systems  Constitutional:  Positive for fatigue. Negative for activity change, appetite change, chills and unexpected weight change.  HENT:  Negative for congestion, mouth sores and sinus pressure.   Eyes:  Negative for visual disturbance.  Respiratory:  Negative for cough and chest tightness.   Gastrointestinal:  Negative for abdominal pain and nausea.  Genitourinary:  Negative for difficulty urinating, frequency and vaginal pain.  Musculoskeletal:  Positive for arthralgias, back pain and gait problem.  Skin:  Positive for rash. Negative for color change and pallor.  Neurological:  Negative for dizziness, tremors, weakness, numbness and headaches.  Psychiatric/Behavioral:  Negative for confusion, sleep disturbance and suicidal ideas. The patient is nervous/anxious.     Objective:  BP (!) 148/80 (BP Location: Left Arm)   Pulse 81   Temp 98.2 F (36.8 C) (Oral)   Ht '5\' 1"'$  (1.549 m)   Wt 146 lb 12.8 oz (66.6 kg)   SpO2 95%   BMI 27.74 kg/m   BP Readings from Last 3 Encounters:  11/05/21 (!) 148/80  10/03/21 (!) 158/76  09/24/21 138/86    Wt Readings from Last 3 Encounters:  11/05/21 146 lb 12.8 oz (66.6 kg)  10/03/21 151 lb 9.6 oz (68.8 kg)  09/24/21 153 lb (69.4 kg)    Physical Exam Constitutional:      General: She is not in acute distress.    Appearance: She is well-developed. She is not ill-appearing or toxic-appearing.  HENT:     Head: Normocephalic.     Right Ear: External ear normal.     Left Ear: External ear normal.     Nose: Nose normal.  Eyes:     General:        Right eye: No discharge.        Left eye: No discharge.     Conjunctiva/sclera: Conjunctivae normal.     Pupils: Pupils are equal, round, and reactive to light.  Neck:     Thyroid: No thyromegaly.     Vascular: No JVD.     Trachea: No tracheal deviation.  Cardiovascular:     Rate and Rhythm: Normal rate and regular rhythm.     Heart sounds: Normal heart sounds.   Pulmonary:     Effort: No respiratory distress.     Breath sounds: No stridor. No wheezing.  Abdominal:     General: Bowel sounds are normal. There is no distension.     Palpations: Abdomen is soft. There is no mass.     Tenderness: There is no abdominal tenderness. There is no guarding or rebound.  Musculoskeletal:        General: No tenderness.     Cervical back: Normal range of motion and neck supple. No rigidity.  Lymphadenopathy:     Cervical: No cervical adenopathy.  Skin:    Findings: Rash present. No erythema.  Neurological:     Mental Status: She is oriented to person, place, and time.     Cranial Nerves: No cranial nerve deficit.     Motor: Weakness present. No abnormal muscle tone.     Coordination: Coordination abnormal.     Gait: Gait abnormal.     Deep Tendon Reflexes: Reflexes normal.  Psychiatric:        Behavior: Behavior normal.        Thought Content: Thought content normal.        Judgment: Judgment normal.   Tremor In a w/c Hives on the L arm  Lab Results  Component Value Date   WBC 8.1 02/21/2021   HGB 12.4 02/21/2021   HCT 38.9 02/21/2021   PLT 289 02/21/2021   GLUCOSE 92 05/14/2021   CHOL 200 (H) 01/07/2021   TRIG 53 01/07/2021   HDL 79 01/07/2021   LDLDIRECT 109.8 02/10/2010   LDLCALC 111 (H) 01/07/2021   ALT 6 05/14/2021   AST 14 05/14/2021   NA 143 05/14/2021   K 3.9 05/14/2021   CL 104 05/14/2021   CREATININE 0.95 05/14/2021   BUN 12 05/14/2021   CO2 34 (H) 05/14/2021   TSH 0.67 06/25/2020   INR 0.97 02/07/2014   HGBA1C 5.8 05/14/2021    VAS [place] LOWER EXTREMITY VENOUS (DVT)  Result Date: 03/17/2021  Lower Venous DVT Study Patient Name:  Sharon [place]  Date of Exam:   03/17/2021 Medical Rec #: [id number]          Accession #:    [id number] Date of Birth: 1945/06/28         Patient Gender: F Patient Age:   76 years Exam Location:  [facility] Procedure:      VAS [place] LOWER EXTREMITY VENOUS (DVT) Referring Phys: Cathlean Cower  --------------------------------------------------------------------------------  Indications: Patient denies any shortness of breath or chest pain.  Patient states swelling in both legs from knee down for the past two months.  Risk Factors: Surgery Total right knee replacement 2013. Comparison Study: NA Performing Technologist: Leavy Cella RDCS  Examination Guidelines: A complete evaluation includes B-mode imaging, spectral Doppler, color Doppler, and power Doppler as needed of all accessible portions of each vessel. Bilateral testing is considered an integral part of a complete examination. Limited examinations for reoccurring indications may be performed as noted. The reflux portion of the exam is performed with the patient in reverse Trendelenburg.  +---------+---------------+---------+-----------+----------+--------------+ RIGHT    CompressibilityPhasicitySpontaneityPropertiesThrombus Aging +---------+---------------+---------+-----------+----------+--------------+ CFV      Full           Yes      Yes                                 +---------+---------------+---------+-----------+----------+--------------+ SFJ      Full           Yes      Yes                                 +---------+---------------+---------+-----------+----------+--------------+ FV Prox  Full           Yes      Yes                                 +---------+---------------+---------+-----------+----------+--------------+ FV Mid   Full           Yes      Yes                                 +---------+---------------+---------+-----------+----------+--------------+ FV DistalFull           Yes      Yes                                 +---------+---------------+---------+-----------+----------+--------------+ PFV      Full                                                        +---------+---------------+---------+-----------+----------+--------------+ POP      Full           Yes      Yes                                  +---------+---------------+---------+-----------+----------+--------------+ PTV      Full           Yes      Yes                                 +---------+---------------+---------+-----------+----------+--------------+ PERO     Full           Yes      Yes                                 +---------+---------------+---------+-----------+----------+--------------+  Gastroc  Full                                                        +---------+---------------+---------+-----------+----------+--------------+ GSV      Full           Yes      Yes                                 +---------+---------------+---------+-----------+----------+--------------+   +---------+---------------+---------+-----------+----------+--------------+ LEFT     CompressibilityPhasicitySpontaneityPropertiesThrombus Aging +---------+---------------+---------+-----------+----------+--------------+ CFV      Full           Yes      Yes                                 +---------+---------------+---------+-----------+----------+--------------+ SFJ      Full           Yes      Yes                                 +---------+---------------+---------+-----------+----------+--------------+ FV Prox  Full           Yes      Yes                                 +---------+---------------+---------+-----------+----------+--------------+ FV Mid   Full           Yes      Yes                                 +---------+---------------+---------+-----------+----------+--------------+ FV DistalFull           Yes      Yes                                 +---------+---------------+---------+-----------+----------+--------------+ PFV      Full                                                        +---------+---------------+---------+-----------+----------+--------------+ POP      Full           Yes      Yes                                  +---------+---------------+---------+-----------+----------+--------------+ PTV      Full           Yes      Yes                                 +---------+---------------+---------+-----------+----------+--------------+ PERO     Full           Yes  Yes                                 +---------+---------------+---------+-----------+----------+--------------+ Gastroc  Full                                                        +---------+---------------+---------+-----------+----------+--------------+ GSV      Full           Yes      Yes                                 +---------+---------------+---------+-----------+----------+--------------+    Findings reported to Kallie Edward at 2:41pm.  Summary: BILATERAL: - No evidence of deep vein thrombosis seen in the lower extremities, bilaterally. -No evidence of popliteal cyst, bilaterally.   *See table(s) above for measurements and observations. Electronically signed by Berniece Salines DO on 03/17/2021 at 6:12:05 PM.    Final     Assessment & Plan:   Problem List Items Addressed This Visit     B12 deficiency    On B12      Low back pain    On Celebrex      Relevant Medications   methylPREDNISolone (MEDROL DOSEPAK) 4 MG TBPK tablet   Osteoarthritis    On Celebrex      Relevant Medications   methylPREDNISolone (MEDROL DOSEPAK) 4 MG TBPK tablet   Rash     L arm - hives, recurrent Depo-medrol 80 mg IM Medrol pack po Zyrtec daily         Meds ordered this encounter  Medications   methylPREDNISolone (MEDROL DOSEPAK) 4 MG TBPK tablet    Sig: As directed    Dispense:  21 tablet    Refill:  0   cetirizine (ZYRTEC) 10 MG tablet    Sig: Take 1 tablet (10 mg total) by mouth daily.    Dispense:  90 tablet    Refill:  3      Follow-up: Return in about 3 months (around 02/04/2022) for a follow-up visit.  Walker Kehr, MD

## 2021-11-05 NOTE — Assessment & Plan Note (Addendum)
L arm - hives, recurrent Depo-medrol 80 mg IM Medrol pack po Zyrtec daily

## 2021-11-05 NOTE — Progress Notes (Cosign Needed Addendum)
Pls cosign for DEPO MEDROL 80 MG inj../lmb  Medical screening examination/treatment/procedure(s) were performed by non-physician practitioner and as supervising physician I was immediately available for consultation/collaboration.  I agree with above. Lew Dawes, MD

## 2021-11-06 NOTE — Telephone Encounter (Signed)
Faxed rollator rx to [place].Marland KitchenJohny Chess

## 2021-11-06 NOTE — Telephone Encounter (Signed)
Left message with [facility] regarding verbal orders.

## 2021-11-10 DIAGNOSIS — I10 Essential (primary) hypertension: Secondary | ICD-10-CM | POA: Diagnosis not present

## 2021-11-10 DIAGNOSIS — M1990 Unspecified osteoarthritis, unspecified site: Secondary | ICD-10-CM | POA: Diagnosis not present

## 2021-11-10 DIAGNOSIS — G3184 Mild cognitive impairment, so stated: Secondary | ICD-10-CM | POA: Diagnosis not present

## 2021-11-10 DIAGNOSIS — G20 Parkinson's disease: Secondary | ICD-10-CM | POA: Diagnosis not present

## 2021-11-10 DIAGNOSIS — J45909 Unspecified asthma, uncomplicated: Secondary | ICD-10-CM | POA: Diagnosis not present

## 2021-11-10 NOTE — Telephone Encounter (Signed)
Mallory Klein called to update the fax number for the chairlift RX.  Please send RX to: [phone]

## 2021-11-12 ENCOUNTER — Telehealth: Payer: Self-pay | Admitting: *Deleted

## 2021-11-12 DIAGNOSIS — I10 Essential (primary) hypertension: Secondary | ICD-10-CM | POA: Diagnosis not present

## 2021-11-12 DIAGNOSIS — G20 Parkinson's disease: Secondary | ICD-10-CM | POA: Diagnosis not present

## 2021-11-12 DIAGNOSIS — G3184 Mild cognitive impairment, so stated: Secondary | ICD-10-CM | POA: Diagnosis not present

## 2021-11-12 DIAGNOSIS — M1990 Unspecified osteoarthritis, unspecified site: Secondary | ICD-10-CM | POA: Diagnosis not present

## 2021-11-12 DIAGNOSIS — J45909 Unspecified asthma, uncomplicated: Secondary | ICD-10-CM | POA: Diagnosis not present

## 2021-11-12 NOTE — Telephone Encounter (Signed)
Rec'd fax stating the order to complete the referral for " Mallory Klein"  the documentation is required by pt insurance. Please send patient last ov within 90 days to determine coverage. Pt saw MD 11/05/21 [facility] ov to [facility]...Mallory Klein

## 2021-11-14 NOTE — Telephone Encounter (Signed)
Beckie Busing has called again and states that [facility] still hasn't gotten the order for the chairlift.   Please re-fax the orders to [phone]

## 2021-11-15 MED ORDER — METHYLPREDNISOLONE ACETATE 40 MG/ML IJ SUSP
40.0000 mg | INTRAMUSCULAR | Status: AC | PRN
  Administered 2020-08-02: 40 mg via INTRA_ARTICULAR

## 2021-11-15 MED ORDER — BUPIVACAINE HCL 0.5 % IJ SOLN
5.0000 mL | INTRAMUSCULAR | Status: AC | PRN
  Administered 2020-08-02: 5 mL via INTRA_ARTICULAR

## 2021-11-17 DIAGNOSIS — G3184 Mild cognitive impairment, so stated: Secondary | ICD-10-CM | POA: Diagnosis not present

## 2021-11-17 DIAGNOSIS — J45909 Unspecified asthma, uncomplicated: Secondary | ICD-10-CM | POA: Diagnosis not present

## 2021-11-17 DIAGNOSIS — I10 Essential (primary) hypertension: Secondary | ICD-10-CM | POA: Diagnosis not present

## 2021-11-17 DIAGNOSIS — M1990 Unspecified osteoarthritis, unspecified site: Secondary | ICD-10-CM | POA: Diagnosis not present

## 2021-11-17 DIAGNOSIS — G20 Parkinson's disease: Secondary | ICD-10-CM | POA: Diagnosis not present

## 2021-11-17 NOTE — Telephone Encounter (Signed)
Refaxed DME for chairlift to [phone].Marland KitchenJohny Chess

## 2021-11-17 NOTE — Telephone Encounter (Signed)
Patient's husband is calling in asking for an update, advised that it was sent to [place].

## 2021-11-18 ENCOUNTER — Ambulatory Visit (INDEPENDENT_AMBULATORY_CARE_PROVIDER_SITE_OTHER): Payer: Medicare Other | Admitting: Neurology

## 2021-11-18 ENCOUNTER — Encounter: Payer: Self-pay | Admitting: Neurology

## 2021-11-18 VITALS — BP 138/87 | HR 108 | Ht 61.0 in | Wt 151.0 lb

## 2021-11-18 DIAGNOSIS — R269 Unspecified abnormalities of gait and mobility: Secondary | ICD-10-CM

## 2021-11-18 DIAGNOSIS — R293 Abnormal posture: Secondary | ICD-10-CM

## 2021-11-18 DIAGNOSIS — M25562 Pain in left knee: Secondary | ICD-10-CM | POA: Diagnosis not present

## 2021-11-18 DIAGNOSIS — G8929 Other chronic pain: Secondary | ICD-10-CM | POA: Diagnosis not present

## 2021-11-18 DIAGNOSIS — M25561 Pain in right knee: Secondary | ICD-10-CM

## 2021-11-18 DIAGNOSIS — G20 Parkinson's disease: Secondary | ICD-10-CM

## 2021-11-18 NOTE — Progress Notes (Signed)
Subjective:    Patient ID: Mallory Klein is a 76 y.o. female.  HPI    Interim history:   Mallory Klein is a 76 year old right-handed woman with an underlying medical history of tremors, sleep apnea, iron deficiency anemia, vitamin D deficiency, low back pain, with history of lumbar degenerative spine disease, followed by pain management, reflux disease, hypertension, diverticulosis, asthma, and mild obesity, who presents for follow-up consultation of her Parkinson's disease.  The patient is accompanied by her husband today. She missed an appointment on 07/22/2021. I last saw her on 11/19/2020, at which time she reported reducing her Sinemet.  She was in the process of being evaluated for sleep apnea again.  She had an episode of syncope, she had seen cardiology.  She had nausea with the Sinemet and we mutually agreed to taper her off of it.  She was advised to start pramipexole in low-dose with gradual titration. In October 2022, I increased the pramipexole to 0.5 mg 3 times daily.  She saw Mallory Presto, NP in the interim on 03/25/2021, at which time her pramipexole was increased to 0.75 mg 3 times daily.  She presented to the emergency room on 02/21/2021 with leg swelling.  She was given a prescription for low-dose hydrochlorothiazide.  Today, 11/18/21: She reports having bilateral knee pain.  She has not fallen recently, she uses a rolling walker.  She takes pramipexole 0.75 mg 3 times daily, she reports taking it at noon, then somewhere around 4 PM and then again at 6 PM on average but is not sure about the exact dose times, she does go to bed around 10 PM but does not sleep well and stays in bed for a prolonged period of time, gets out of bed around noon.  Sometimes she gets confused about her medications.  She has a medication pillbox with multiple slots for a full month but they have not really used this box consistently and do not typically fill it up again.  She tries to hydrate well with water.   She reports that she currently does not take her Seroquel, she also reports that she currently does not take a Butrans patch.  She is not sure about her hydrochlorothiazide.   Previously:   I saw her on 05/22/2020, at which time she reported a few falls.  She had seen Dr. Redmond Baseman for anosmia.  He ordered a brain MRI.  She had a brain MRI with and without contrast on 05/27/2020 and I reviewed the results: IMPRESSION: 1. No acute intracranial abnormality. 2. Moderately advanced but nonspecific cerebral white matter signal changes, most commonly due to chronic small vessel disease. 3. Olfactory bulbs are not identified suspicious for atrophy. 4. Mild paranasal sinus inflammation. 5. C3-C4 disc degeneration with mild cervical spinal stenosis.   She was not eating very well.  She had significant nausea.  She was also struggling with constipation and saw GI.   She was advised to continue with Sinemet CR 1 pill 3 times daily.     I first met her at the request of her primary care physician on 11/22/2019, at which time she requested a switch in providers from [name] to our office.  She reported a history of tremors for about 3 years.  She was diagnosed with Parkinson's disease and had been on Sinemet.  She was having difficulty keeping a schedule of her medicine.  She was advised to streamline her medication regimen and switch to Sinemet CR 50-200 mg strength 1 pill  4 times a day.  She was reluctant to take it 4 times a day and was agreeable to taking it 3 times a day.  She called in the interim reporting more issues with her balance and near falls as well as falls.       11/22/19: (She) reports tremors for the past approximately 3 years.  She has had changes in her walking abilities.  She has had posture changes but also has undergone several surgeries and had injuries in the past.  She has chronic back pain and had a failed right knee replacement which complicates the situation.  She has seen a decline in  her ability to write.  She reports that her tremor is worse on the right side.  She wonders if her father had Parkinson's disease.  He had decline in his handwriting and also developed a tremor.   She started seeing [name] at [facility] neurology in October 2019 and I reviewed available neurology notes.  She was diagnosed with tremor predominant Parkinson's disease at the time and started on immediate release Sinemet.  She was subsequently switched from Sinemet IR to Sinemet CR. I reviewed your office note from 09/11/2019.  She had some blood work at the time including B12 level and BMP, kidney function was good, B12 level elevated. She reports difficulty adhering to a medication schedule as she does have side effects from taking the levodopa particularly queasiness.  She also believes that taking it on an empty stomach makes it worse.  She has not been sleeping very well and sometimes goes to sleep in the early morning hours around 5 or 6 AM even.  She has had a recent prescription from your office for temazepam and started taking 15 mg first and then increase it to 30 mg.  She tries to take it at night, reports that she has been able to go to sleep around 1 AM at times.  She is hoping to scale back to 15 mg daily.  She is on chronic pain medication.  She is supposed to take Sinemet CR 25-100 mg strength 2 pills 3 times daily at 10 AM, 2 PM and 6 PM.  She tries to adhere to the schedule.  Sometimes she does not take it 3 times a day and only twice daily but admits that she has taken 3 tablets together as well.  She is taking Sinemet CR 50-200 mg strength at bedtime.  She drinks caffeine in the form of ice tea, 1 or 2 cups/day.  She is a non-smoker.  She drinks alcohol occasionally in the form of white wine.  She lives with her husband and 1 dog.  They had 2 children, one passed away.  She also takes baclofen for her back pain.  She has been on Aricept for memory loss.  She had neuropsychological evaluation through  the [facility] neuropsychology, [name] and was deemed to have mild cognitive impairment.  She denies any hallucinations but has had instances of hearing a shuffling sound in her ears, it happens infrequently and has actually improved.  In the past, she also had muffled hearing and was found to have cerumen impaction.  Her Past Medical History Is Significant For: Past Medical History:  Diagnosis Date   Abnormal CBC 02/05/2015   [name] 12/16 new: normocytic anemia and thrombocytosis   Adjustment disorder with mixed anxiety and depressed mood 02/15/2007   Chronic  Chronic pain Grief, stress Effexor XR  Dad died in 02-18-2023  Asthma    Diverticulosis of colon (without mention of hemorrhage)    Esophageal stricture    Essential hypertension 01/26/2007   Chronic Verapamil    Falls    "I blackout"   GERD (gastroesophageal reflux disease) 02/15/2007   Chronic     Heart murmur    Hyperglycemia 03/02/2011   Mild     Insomnia disorder 12/08/2018   10/20 Carbid/Lev dose was increased - c/o hard time falling asleep (6 am) getting up at 12 am, poor sleep. Try Temazepam 15-30 mg at 11-1:30 pm    Internal hemorrhoids without mention of complication    Iron deficiency anemia 04/23/2017   Mild neurocognitive disorder, likely due to Parkinson's disease 02/09/2019   OSA (obstructive sleep apnea) 02/14/2010   In lab study (May 2018): AHI 25. Only had 45 minutes of sleep secondary to frequent awakenings secondary to sleep apnea. autocpap 5-15 cm water.    Osteoarthritis    Parkinson disease ([place]) 02/09/2014   2015 2017  Primidone - d/c 2019 Parkinson's: Sinmet IR. Dr Tat   Postherpetic trigeminal neuralgia 01/26/2007   Qualifier: Diagnosis of  By: Marca Ancona RMA, [name]     Shortness of breath dyspnea    with allergies   Thrombocytosis 05/24/2017   Urinary incontinence 01/26/2007   Chronic  10/17 Detrol LA    Vitamin D deficiency 01/26/2019    Her Past Surgical History Is Significant For: Past Surgical  History:  Procedure Laterality Date   ABDOMINAL HYSTERECTOMY     BREAST CYST EXCISION Left 1980's   LUMBAR LAMINECTOMY/DECOMPRESSION MICRODISCECTOMY Bilateral 02/20/2014   Procedure: LUMBAR TWO THREE, LUMBAR THREE FOUR LUMBAR LAMINECTOMY/DECOMPRESSION MICRODISCECTOMY 2 LEVELS;  Surgeon: Eustace Moore, MD;  Location: [facility];  Service: Neurosurgery;  Laterality: Bilateral;   SPLENECTOMY     TIBIA FRACTURE SURGERY Right    TIBIA FRACTURE SURGERY Left    TOTAL KNEE ARTHROPLASTY Right 2013    Her Family History Is Significant For: Family History  Problem Relation Age of Onset   Hypertension Mother    Diabetes type II Mother    Asthma Mother    Diabetes Mother    Dementia Mother    Prostate cancer Father    Mental illness Father        dementia   Stroke Brother        died in prison   Suicidality Son    Colon cancer Neg Hx    Rectal cancer Neg Hx    Parkinson's disease Neg Hx     Her Social History Is Significant For: Social History   Socioeconomic History   Marital status: Married    Spouse name: Not on file   Number of children: 2   Years of education: 14   Highest education level: Futures trader degree: occupational, Hotel manager, or vocational program  Occupational History   Occupation: Retired    Comment: Network engineer, Youth worker   Tobacco Use   Smoking status: Never   Smokeless tobacco: Never  Vaping Use   Vaping Use: Never used  Substance and Sexual Activity   Alcohol use: Not Currently    Comment: wine on occ    Drug use: No   Sexual activity: Not Currently  Other Topics Concern   Not on file  Social History Narrative   Drink tea and coffee on occ   Right handed   1 story    Lives with spouse   Social Determinants of Health   Financial Resource Strain: Low Risk  (05/14/2021)  Overall Financial Resource Strain (CARDIA)    Difficulty of Paying Living Expenses: Not hard at all  Food Insecurity: No Food Insecurity (05/14/2021)   Hunger Vital Sign     Worried About Running Out of Food in the Last Year: Never true    Ran Out of Food in the Last Year: Never true  Transportation Needs: No Transportation Needs (05/14/2021)   PRAPARE - Hydrologist (Medical): No    Lack of Transportation (Non-Medical): No  Physical Activity: Insufficiently Active (05/14/2021)   Exercise Vital Sign    Days of Exercise per Week: 1 day    Minutes of Exercise per Session: 60 min  Stress: No Stress Concern Present (05/14/2021)   [place]    Feeling of Stress : Not at all  Social Connections: [place] (05/14/2021)   Social Connection and Isolation Panel [NHANES]    Frequency of Communication with Friends and Family: More than three times a week    Frequency of Social Gatherings with Friends and Family: More than three times a week    Attends Religious Services: More than 4 times per year    Active Member of [employer] or Organizations: Yes    Attends Music therapist: More than 4 times per year    Marital Status: Married    Her Allergies Are:  Allergies  Allergen Reactions   Ace Inhibitors     Patient doesn't recall reaction.  jkl   Benicar [Olmesartan]     It made her sick   Aspirin Other (See Comments)    bruising   Citalopram Hydrobromide Diarrhea  :   Her Current Medications Are:  Outpatient Encounter Medications as of 11/18/2021  Medication Sig   buprenorphine (BUTRANS) 10 MCG/HR PTWK 1 patch once a week.   celecoxib (CELEBREX) 200 MG capsule Take 1 capsule (200 mg total) by mouth 2 (two) times daily as needed for moderate pain.   cetirizine (ZYRTEC) 10 MG tablet Take 1 tablet (10 mg total) by mouth daily.   Cholecalciferol (VITAMIN D3) 50 MCG (2000 UT) capsule TAKE 1 CAPSULE BY MOUTH EVERY DAY   [employer] SENNA PLUS 8.6-50 MG tablet Take 1 tablet by mouth 2 (two) times daily.   cyanocobalamin (VITAMIN B12) 1000 MCG/ML injection INJECT 1 ML  INTO THE SKIN EVERY 14 DAYS   diclofenac Sodium (VOLTAREN) 1 % GEL Apply 4 g topically daily as needed (pain).   docusate sodium (COLACE) 100 MG capsule Take 100 mg by mouth at bedtime.   hydrochlorothiazide (MICROZIDE) 12.5 MG capsule Take 1 capsule (12.5 mg total) by mouth daily.   hydrOXYzine (VISTARIL) 50 MG capsule Take 1-2 capsules (50-100 mg total) by mouth every 6 (six) hours as needed for anxiety.   methylPREDNISolone (MEDROL DOSEPAK) 4 MG TBPK tablet As directed   pantoprazole (PROTONIX) 40 MG tablet Take 1 tablet (40 mg total) by mouth daily.   polyethylene glycol (MIRALAX / GLYCOLAX) 17 g packet Take 17 g by mouth daily as needed for mild constipation.   pramipexole (MIRAPEX) 0.75 MG tablet Take 1 tablet (0.75 mg total) by mouth 3 (three) times daily.   QUEtiapine (SEROQUEL) 100 MG tablet Take 1 tablet (100 mg total) by mouth at bedtime.   rosuvastatin (CRESTOR) 10 MG tablet Take 1 tablet (10 mg total) by mouth daily.   solifenacin (VESICARE) 5 MG tablet Take 1 tablet (5 mg total) by mouth daily.   [DISCONTINUED] donepezil (ARICEPT) 5  MG tablet Take 1 tablet (5 mg total) by mouth at bedtime. (Patient not taking: No sig reported)   [DISCONTINUED] venlafaxine XR (EFFEXOR-XR) 150 MG 24 hr capsule TAKE 1 CAPSULE BY MOUTH  EVERY DAY WITH BREAKFAST (Patient not taking: No sig reported)   No facility-administered encounter medications on file as of 11/18/2021.  :  Review of Systems:  Out of a complete 14 point review of systems, all are reviewed and negative with the exception of these symptoms as listed below:  Review of Systems  Neurological:        Pt here for parkinson f/u  Pt states tremors are worse in both hands Pt states both legs and knees hurt Pt states hard for her to walk Pt walks with 4 wheel walker Pt states in the last 3 months increased confusion with medication Pt states that she needs a RN to help with medication Pt states she wants to see if her medication can come in a  packet . Pt states she thinks that will help her .     Objective:  Neurological Exam  Physical Exam Physical Examination:   Vitals:   11/18/21 1314  BP: 138/87  Pulse: (!) 108    General Examination: The patient is a very pleasant 76 y.o. female in no acute distress. She appears well-developed and well-nourished and well groomed.   HEENT: Normocephalic, atraumatic, pupils are equal, round and reactive to light, extraocular tracking is preserved, no nystagmus, she is status post cataract repairs, corrective eyeglasses in place.  She has mild nuchal rigidity, no significant head tremor.  Hearing is grossly intact, mild facial masking noted, intermittent mild voice tremor, mild hypophonia.  No dysarthria.  Airway examination reveals mild mouth dryness.  Tongue protrudes centrally and palate elevates symmetrically.  No significant sialorrhea.   Chest: Clear to auscultation without wheezing, rhonchi or crackles noted.   Heart: S1+S2+0, regular and normal without murmurs, rubs or gallops noted.    Abdomen: Soft, non-tender and non-distended.   Extremities: There is 1+ pitting edema in the distal lower extremities bilaterally. Right leg larger in caliber than left.    Skin: Warm and dry without trophic changes noted.   Musculoskeletal: exam reveals low back pain, increase in lumbar kyphosis, unequal hip height, right leg shorter than left, pain in both knees, right more than left, left genu valgus more than right.    Neurologically:  Mental status: The patient is awake, alert and oriented in all 4 spheres. Her immediate and remote memory, attention, language skills and fund of knowledge are mildly impaired.  Her husband provides additional information.   Mood is normal and affect is normal.  Cranial nerves II - XII are as described above under HEENT exam.  Motor exam: Normal bulk, strength and tone is noted. There is a mild to moderate resting tremor in the right upper extremity, milder  on the left.  No significant action tremor.   Romberg is not tested for safety concerns. Fine motor skills and coordination moderately impaired on the right and slightly better on the left.  She has mild increase in tone with mild cogwheeling noted in the right upper extremity only.  She has a mild resting tremor in the right lower extremity, very slight in the left lower extremity.   Cerebellar testing: No dysmetria or intention tremor on finger.    Sensory exam: intact to light touch in the upper and lower extremities.  Gait, station and balance: She stands with moderate difficulty and  has to push herself up, she requires a couple of attempts.  No assistance needed.  Posture is moderate to significantly stooped and she has unequal hip height, significant genu valgus left more than right.   Significant increase in lumbar kyphosis.  She has unequal shoulder height as well.  She walks with her rolling walker, no obvious shuffling.  Balance is impaired.   Assessment and Plan:    In summary, LLUVIA GWYNNE is a very pleasant 76 year old female with an underlying medical history of tremors, sleep apnea, iron deficiency anemia, vitamin D deficiency, arthritis, LBP, with history of lumbar degenerative spine disease, followed by pain management, reflux disease, hypertension, diverticulosis, asthma, and overweight state, who presents for follow-up consultation of her right-sided predominant Parkinson's disease, complicated by chronic constipation, also medication sensitivity, history of falls, arthritis, and syncopal spells.  We talked about the importance of fall prevention.  She is encouraged to continue with pramipexole 0.75 mg 3 times daily.  She stopped taking the Sinemet CR because of nausea.  She is advised to stay on schedule with her pramipexole, I recommended she take it at noon, 4 PM and 8 PM daily.   In September 2021 we switched her to Sinemet CR 50-200 mg strength 1 pill 3 times daily. She was  discouraged from using a protein milkshake or Glucerna with her C/L, as it may impair absorption of the levodopa. She was advised to continue with her Sinemet CR in March 2022, 1 pill 3 times daily but reduced it to 1 pill twice daily. We tapered off her Sinemet CR in September 2022 and started Mirapex 0.125 mg strength 1 pill once daily with gradual increase.  She is advised to monitor her swelling.  She is not sure if she is taking her hydrochlorothiazide.  She stopped taking Butrans and Seroquel.  She is advised to follow-up with her primary care physician.  She is advised to follow-up in this clinic to see Mallory Presto, NP in about 4 months.  For now, she is advised to continue with her pramipexole at the current dose.  We can consider increasing it to 1 mg 3 times daily at the next visit so long as her swelling is less or stable.  She is advised to pramipexole can cause swelling as well.   I answered all her questions today and the patient and her husband were in agreement.   I spent 30 minutes in total face-to-face time and in reviewing records during pre-charting, more than 50% of which was spent in counseling and coordination of care, reviewing test results, reviewing medications and treatment regimen and/or in discussing or reviewing the diagnosis of PD, the prognosis and treatment options. Pertinent laboratory and imaging test results that were available during this visit with the patient were reviewed by me and considered in my medical decision making (see chart for details).

## 2021-11-18 NOTE — Patient Instructions (Signed)
Please continue with the pramipexole at 0.75 mg 3 times a day, take at 12, 4 PM and 8 PM daily.  Please use your walker at all times.  Hydrate well with water, drink about 6 cups of water per day or even 8 cups per day if possible.  Monitor your leg swelling.

## 2021-11-19 DIAGNOSIS — G3184 Mild cognitive impairment, so stated: Secondary | ICD-10-CM | POA: Diagnosis not present

## 2021-11-19 DIAGNOSIS — G20 Parkinson's disease: Secondary | ICD-10-CM | POA: Diagnosis not present

## 2021-11-19 DIAGNOSIS — M1990 Unspecified osteoarthritis, unspecified site: Secondary | ICD-10-CM | POA: Diagnosis not present

## 2021-11-19 DIAGNOSIS — I10 Essential (primary) hypertension: Secondary | ICD-10-CM | POA: Diagnosis not present

## 2021-11-19 DIAGNOSIS — J45909 Unspecified asthma, uncomplicated: Secondary | ICD-10-CM | POA: Diagnosis not present

## 2021-11-24 DIAGNOSIS — G3184 Mild cognitive impairment, so stated: Secondary | ICD-10-CM | POA: Diagnosis not present

## 2021-11-24 DIAGNOSIS — J45909 Unspecified asthma, uncomplicated: Secondary | ICD-10-CM | POA: Diagnosis not present

## 2021-11-24 DIAGNOSIS — G20A1 Parkinson's disease without dyskinesia, without mention of fluctuations: Secondary | ICD-10-CM | POA: Diagnosis not present

## 2021-11-24 DIAGNOSIS — I10 Essential (primary) hypertension: Secondary | ICD-10-CM | POA: Diagnosis not present

## 2021-11-24 DIAGNOSIS — M1990 Unspecified osteoarthritis, unspecified site: Secondary | ICD-10-CM | POA: Diagnosis not present

## 2021-12-03 DIAGNOSIS — G20A1 Parkinson's disease without dyskinesia, without mention of fluctuations: Secondary | ICD-10-CM | POA: Diagnosis not present

## 2021-12-03 DIAGNOSIS — J45909 Unspecified asthma, uncomplicated: Secondary | ICD-10-CM | POA: Diagnosis not present

## 2021-12-03 DIAGNOSIS — I10 Essential (primary) hypertension: Secondary | ICD-10-CM | POA: Diagnosis not present

## 2021-12-03 DIAGNOSIS — M1990 Unspecified osteoarthritis, unspecified site: Secondary | ICD-10-CM | POA: Diagnosis not present

## 2021-12-03 DIAGNOSIS — G3184 Mild cognitive impairment, so stated: Secondary | ICD-10-CM | POA: Diagnosis not present

## 2021-12-09 ENCOUNTER — Telehealth: Payer: Self-pay

## 2021-12-09 NOTE — Telephone Encounter (Signed)
Home Health verbal orders  Agency Name: [facility]   Requesting PT  Frequency: 1W2   Please forward to [facility] pool or providers CMA

## 2021-12-10 NOTE — Telephone Encounter (Signed)
Notified Monique w/MD response../lmb 

## 2021-12-10 NOTE — Telephone Encounter (Signed)
Okay.  Thanks.

## 2021-12-11 DIAGNOSIS — G20A1 Parkinson's disease without dyskinesia, without mention of fluctuations: Secondary | ICD-10-CM | POA: Diagnosis not present

## 2021-12-11 DIAGNOSIS — M1990 Unspecified osteoarthritis, unspecified site: Secondary | ICD-10-CM | POA: Diagnosis not present

## 2021-12-11 DIAGNOSIS — G3184 Mild cognitive impairment, so stated: Secondary | ICD-10-CM | POA: Diagnosis not present

## 2021-12-11 DIAGNOSIS — J45909 Unspecified asthma, uncomplicated: Secondary | ICD-10-CM | POA: Diagnosis not present

## 2021-12-11 DIAGNOSIS — I10 Essential (primary) hypertension: Secondary | ICD-10-CM | POA: Diagnosis not present

## 2021-12-19 DIAGNOSIS — G3184 Mild cognitive impairment, so stated: Secondary | ICD-10-CM | POA: Diagnosis not present

## 2021-12-19 DIAGNOSIS — I10 Essential (primary) hypertension: Secondary | ICD-10-CM | POA: Diagnosis not present

## 2021-12-19 DIAGNOSIS — J45909 Unspecified asthma, uncomplicated: Secondary | ICD-10-CM | POA: Diagnosis not present

## 2021-12-19 DIAGNOSIS — G20A1 Parkinson's disease without dyskinesia, without mention of fluctuations: Secondary | ICD-10-CM | POA: Diagnosis not present

## 2021-12-19 DIAGNOSIS — M1990 Unspecified osteoarthritis, unspecified site: Secondary | ICD-10-CM | POA: Diagnosis not present

## 2021-12-23 ENCOUNTER — Telehealth: Payer: Self-pay | Admitting: Internal Medicine

## 2021-12-23 DIAGNOSIS — G3184 Mild cognitive impairment, so stated: Secondary | ICD-10-CM | POA: Diagnosis not present

## 2021-12-23 DIAGNOSIS — I10 Essential (primary) hypertension: Secondary | ICD-10-CM | POA: Diagnosis not present

## 2021-12-23 DIAGNOSIS — J45909 Unspecified asthma, uncomplicated: Secondary | ICD-10-CM | POA: Diagnosis not present

## 2021-12-23 DIAGNOSIS — G20A1 Parkinson's disease without dyskinesia, without mention of fluctuations: Secondary | ICD-10-CM | POA: Diagnosis not present

## 2021-12-23 DIAGNOSIS — M1990 Unspecified osteoarthritis, unspecified site: Secondary | ICD-10-CM | POA: Diagnosis not present

## 2021-12-23 NOTE — Telephone Encounter (Signed)
Monique from [facility] called for verbals to do a reverification for home health orders for the pt.   Please call Monique to confirm:   [phone]

## 2021-12-24 NOTE — Telephone Encounter (Signed)
Okay. Thank you.

## 2021-12-24 NOTE — Telephone Encounter (Signed)
Called Mallory Klein no answer LMOM w/MD response.Marland KitchenJohny Klein

## 2021-12-25 DIAGNOSIS — R42 Dizziness and giddiness: Secondary | ICD-10-CM | POA: Diagnosis not present

## 2021-12-25 DIAGNOSIS — G20A1 Parkinson's disease without dyskinesia, without mention of fluctuations: Secondary | ICD-10-CM | POA: Diagnosis not present

## 2021-12-25 DIAGNOSIS — G3184 Mild cognitive impairment, so stated: Secondary | ICD-10-CM | POA: Diagnosis not present

## 2021-12-25 DIAGNOSIS — I10 Essential (primary) hypertension: Secondary | ICD-10-CM | POA: Diagnosis not present

## 2021-12-25 DIAGNOSIS — M1990 Unspecified osteoarthritis, unspecified site: Secondary | ICD-10-CM | POA: Diagnosis not present

## 2021-12-25 DIAGNOSIS — J45909 Unspecified asthma, uncomplicated: Secondary | ICD-10-CM | POA: Diagnosis not present

## 2021-12-30 DIAGNOSIS — M1990 Unspecified osteoarthritis, unspecified site: Secondary | ICD-10-CM | POA: Diagnosis not present

## 2021-12-30 DIAGNOSIS — I10 Essential (primary) hypertension: Secondary | ICD-10-CM | POA: Diagnosis not present

## 2021-12-30 DIAGNOSIS — G3184 Mild cognitive impairment, so stated: Secondary | ICD-10-CM | POA: Diagnosis not present

## 2021-12-30 DIAGNOSIS — G20A1 Parkinson's disease without dyskinesia, without mention of fluctuations: Secondary | ICD-10-CM | POA: Diagnosis not present

## 2021-12-30 DIAGNOSIS — R42 Dizziness and giddiness: Secondary | ICD-10-CM | POA: Diagnosis not present

## 2021-12-30 DIAGNOSIS — J45909 Unspecified asthma, uncomplicated: Secondary | ICD-10-CM | POA: Diagnosis not present

## 2021-12-31 DIAGNOSIS — R42 Dizziness and giddiness: Secondary | ICD-10-CM | POA: Diagnosis not present

## 2021-12-31 DIAGNOSIS — G20A1 Parkinson's disease without dyskinesia, without mention of fluctuations: Secondary | ICD-10-CM | POA: Diagnosis not present

## 2021-12-31 DIAGNOSIS — G3184 Mild cognitive impairment, so stated: Secondary | ICD-10-CM | POA: Diagnosis not present

## 2021-12-31 DIAGNOSIS — I10 Essential (primary) hypertension: Secondary | ICD-10-CM | POA: Diagnosis not present

## 2021-12-31 DIAGNOSIS — J45909 Unspecified asthma, uncomplicated: Secondary | ICD-10-CM | POA: Diagnosis not present

## 2021-12-31 DIAGNOSIS — M1990 Unspecified osteoarthritis, unspecified site: Secondary | ICD-10-CM | POA: Diagnosis not present

## 2022-01-02 DIAGNOSIS — R42 Dizziness and giddiness: Secondary | ICD-10-CM | POA: Diagnosis not present

## 2022-01-02 DIAGNOSIS — J45909 Unspecified asthma, uncomplicated: Secondary | ICD-10-CM | POA: Diagnosis not present

## 2022-01-02 DIAGNOSIS — G3184 Mild cognitive impairment, so stated: Secondary | ICD-10-CM | POA: Diagnosis not present

## 2022-01-02 DIAGNOSIS — I10 Essential (primary) hypertension: Secondary | ICD-10-CM | POA: Diagnosis not present

## 2022-01-02 DIAGNOSIS — M1990 Unspecified osteoarthritis, unspecified site: Secondary | ICD-10-CM | POA: Diagnosis not present

## 2022-01-02 DIAGNOSIS — G20A1 Parkinson's disease without dyskinesia, without mention of fluctuations: Secondary | ICD-10-CM | POA: Diagnosis not present

## 2022-01-06 DIAGNOSIS — R42 Dizziness and giddiness: Secondary | ICD-10-CM | POA: Diagnosis not present

## 2022-01-06 DIAGNOSIS — J45909 Unspecified asthma, uncomplicated: Secondary | ICD-10-CM | POA: Diagnosis not present

## 2022-01-06 DIAGNOSIS — G20A1 Parkinson's disease without dyskinesia, without mention of fluctuations: Secondary | ICD-10-CM | POA: Diagnosis not present

## 2022-01-06 DIAGNOSIS — G3184 Mild cognitive impairment, so stated: Secondary | ICD-10-CM | POA: Diagnosis not present

## 2022-01-06 DIAGNOSIS — M1990 Unspecified osteoarthritis, unspecified site: Secondary | ICD-10-CM | POA: Diagnosis not present

## 2022-01-06 DIAGNOSIS — I10 Essential (primary) hypertension: Secondary | ICD-10-CM | POA: Diagnosis not present

## 2022-01-08 DIAGNOSIS — G20A1 Parkinson's disease without dyskinesia, without mention of fluctuations: Secondary | ICD-10-CM | POA: Diagnosis not present

## 2022-01-08 DIAGNOSIS — I10 Essential (primary) hypertension: Secondary | ICD-10-CM | POA: Diagnosis not present

## 2022-01-08 DIAGNOSIS — J45909 Unspecified asthma, uncomplicated: Secondary | ICD-10-CM | POA: Diagnosis not present

## 2022-01-08 DIAGNOSIS — G3184 Mild cognitive impairment, so stated: Secondary | ICD-10-CM | POA: Diagnosis not present

## 2022-01-08 DIAGNOSIS — M1990 Unspecified osteoarthritis, unspecified site: Secondary | ICD-10-CM | POA: Diagnosis not present

## 2022-01-08 DIAGNOSIS — R42 Dizziness and giddiness: Secondary | ICD-10-CM | POA: Diagnosis not present

## 2022-01-12 DIAGNOSIS — G20A1 Parkinson's disease without dyskinesia, without mention of fluctuations: Secondary | ICD-10-CM | POA: Diagnosis not present

## 2022-01-12 DIAGNOSIS — G3184 Mild cognitive impairment, so stated: Secondary | ICD-10-CM | POA: Diagnosis not present

## 2022-01-12 DIAGNOSIS — M1990 Unspecified osteoarthritis, unspecified site: Secondary | ICD-10-CM | POA: Diagnosis not present

## 2022-01-12 DIAGNOSIS — R42 Dizziness and giddiness: Secondary | ICD-10-CM | POA: Diagnosis not present

## 2022-01-12 DIAGNOSIS — J45909 Unspecified asthma, uncomplicated: Secondary | ICD-10-CM | POA: Diagnosis not present

## 2022-01-12 DIAGNOSIS — I10 Essential (primary) hypertension: Secondary | ICD-10-CM | POA: Diagnosis not present

## 2022-01-13 DIAGNOSIS — G20A1 Parkinson's disease without dyskinesia, without mention of fluctuations: Secondary | ICD-10-CM | POA: Diagnosis not present

## 2022-01-13 DIAGNOSIS — J45909 Unspecified asthma, uncomplicated: Secondary | ICD-10-CM | POA: Diagnosis not present

## 2022-01-13 DIAGNOSIS — I10 Essential (primary) hypertension: Secondary | ICD-10-CM | POA: Diagnosis not present

## 2022-01-13 DIAGNOSIS — M1990 Unspecified osteoarthritis, unspecified site: Secondary | ICD-10-CM | POA: Diagnosis not present

## 2022-01-13 DIAGNOSIS — R42 Dizziness and giddiness: Secondary | ICD-10-CM | POA: Diagnosis not present

## 2022-01-13 DIAGNOSIS — G3184 Mild cognitive impairment, so stated: Secondary | ICD-10-CM | POA: Diagnosis not present

## 2022-01-20 DIAGNOSIS — M1990 Unspecified osteoarthritis, unspecified site: Secondary | ICD-10-CM | POA: Diagnosis not present

## 2022-01-20 DIAGNOSIS — G3184 Mild cognitive impairment, so stated: Secondary | ICD-10-CM | POA: Diagnosis not present

## 2022-01-20 DIAGNOSIS — R42 Dizziness and giddiness: Secondary | ICD-10-CM | POA: Diagnosis not present

## 2022-01-20 DIAGNOSIS — J45909 Unspecified asthma, uncomplicated: Secondary | ICD-10-CM | POA: Diagnosis not present

## 2022-01-20 DIAGNOSIS — G20A1 Parkinson's disease without dyskinesia, without mention of fluctuations: Secondary | ICD-10-CM | POA: Diagnosis not present

## 2022-01-20 DIAGNOSIS — I10 Essential (primary) hypertension: Secondary | ICD-10-CM | POA: Diagnosis not present

## 2022-01-21 DIAGNOSIS — G3184 Mild cognitive impairment, so stated: Secondary | ICD-10-CM | POA: Diagnosis not present

## 2022-01-21 DIAGNOSIS — G20A1 Parkinson's disease without dyskinesia, without mention of fluctuations: Secondary | ICD-10-CM | POA: Diagnosis not present

## 2022-01-21 DIAGNOSIS — R42 Dizziness and giddiness: Secondary | ICD-10-CM | POA: Diagnosis not present

## 2022-01-21 DIAGNOSIS — M1990 Unspecified osteoarthritis, unspecified site: Secondary | ICD-10-CM | POA: Diagnosis not present

## 2022-01-21 DIAGNOSIS — I10 Essential (primary) hypertension: Secondary | ICD-10-CM | POA: Diagnosis not present

## 2022-01-21 DIAGNOSIS — J45909 Unspecified asthma, uncomplicated: Secondary | ICD-10-CM | POA: Diagnosis not present

## 2022-01-26 DIAGNOSIS — G20A1 Parkinson's disease without dyskinesia, without mention of fluctuations: Secondary | ICD-10-CM | POA: Diagnosis not present

## 2022-01-26 DIAGNOSIS — J45909 Unspecified asthma, uncomplicated: Secondary | ICD-10-CM | POA: Diagnosis not present

## 2022-01-26 DIAGNOSIS — I10 Essential (primary) hypertension: Secondary | ICD-10-CM | POA: Diagnosis not present

## 2022-01-26 DIAGNOSIS — M1990 Unspecified osteoarthritis, unspecified site: Secondary | ICD-10-CM | POA: Diagnosis not present

## 2022-01-26 DIAGNOSIS — R42 Dizziness and giddiness: Secondary | ICD-10-CM | POA: Diagnosis not present

## 2022-01-26 DIAGNOSIS — G3184 Mild cognitive impairment, so stated: Secondary | ICD-10-CM | POA: Diagnosis not present

## 2022-01-27 ENCOUNTER — Telehealth: Payer: Self-pay | Admitting: Internal Medicine

## 2022-01-27 NOTE — Telephone Encounter (Signed)
Patient wants to know if she should still be on the hydrochlorithizide? - She said that at one time Dr. Alain Marion and taken her off of it and then Dr. Jenny Reichmann put her back on it - Please let patient know if this is something that she still needs to take.

## 2022-01-28 NOTE — Telephone Encounter (Signed)
Pt called again wanting to know if she need to take the HCTZ  she can tell druggist,,.lmb

## 2022-01-29 MED ORDER — HYDROCHLOROTHIAZIDE 12.5 MG PO CAPS: 12.5000 mg | ORAL_CAPSULE | Freq: Every day | ORAL | 3 refills | Status: DC

## 2022-01-29 NOTE — Telephone Encounter (Signed)
Notified pt w/MD response.../lmb 

## 2022-01-29 NOTE — Telephone Encounter (Signed)
Yes, she needs to take HCTZ.  Thank you

## 2022-02-04 ENCOUNTER — Encounter: Payer: Self-pay | Admitting: Internal Medicine

## 2022-02-04 ENCOUNTER — Ambulatory Visit (INDEPENDENT_AMBULATORY_CARE_PROVIDER_SITE_OTHER): Payer: Medicare Other | Admitting: Internal Medicine

## 2022-02-04 VITALS — BP 152/82 | HR 101 | Temp 100.1°F

## 2022-02-04 DIAGNOSIS — R051 Acute cough: Secondary | ICD-10-CM | POA: Diagnosis not present

## 2022-02-04 DIAGNOSIS — G8929 Other chronic pain: Secondary | ICD-10-CM | POA: Diagnosis not present

## 2022-02-04 DIAGNOSIS — J069 Acute upper respiratory infection, unspecified: Secondary | ICD-10-CM | POA: Diagnosis not present

## 2022-02-04 DIAGNOSIS — J4540 Moderate persistent asthma, uncomplicated: Secondary | ICD-10-CM

## 2022-02-04 DIAGNOSIS — M5440 Lumbago with sciatica, unspecified side: Secondary | ICD-10-CM

## 2022-02-04 LAB — POC COVID19 BINAXNOW: SARS Coronavirus 2 Ag: NEGATIVE

## 2022-02-04 MED ORDER — ALBUTEROL SULFATE (2.5 MG/3ML) 0.083% IN NEBU: 2.5000 mg | INHALATION_SOLUTION | Freq: Four times a day (QID) | RESPIRATORY_TRACT | 1 refills | Status: DC | PRN

## 2022-02-04 MED ORDER — AZITHROMYCIN 250 MG PO TABS: ORAL_TABLET | ORAL | 0 refills | Status: DC

## 2022-02-04 MED ORDER — HYDROCODONE BIT-HOMATROP MBR 5-1.5 MG/5ML PO SOLN: 5.0000 mL | ORAL | 0 refills | Status: AC | PRN

## 2022-02-04 MED ORDER — METHYLPREDNISOLONE ACETATE 80 MG/ML IJ SUSP
80.0000 mg | Freq: Once | INTRAMUSCULAR | Status: AC
  Administered 2022-02-04: 80 mg via INTRAMUSCULAR

## 2022-02-04 NOTE — Assessment & Plan Note (Signed)
New COVID test Hycodan syr Zpac Medrol pac

## 2022-02-04 NOTE — Progress Notes (Signed)
Subjective:  Patient ID: Mallory Klein, female    DOB: 1945-11-19  Age: 76 y.o. MRN: [id number]  CC: Follow-up (3 month f/u) and Cough (Pt states her cough started yesterday, husband had covid couple weeks ago)   HPI SHALANA JARDIN presents for URI sx's, fever x 2d, cough F/u n HTN, LBP Davisha is off Butrans  Outpatient Medications Prior to Visit  Medication Sig Dispense Refill   celecoxib (CELEBREX) 200 MG capsule Take 1 capsule (200 mg total) by mouth 2 (two) times daily as needed for moderate pain. 60 capsule 2   cetirizine (ZYRTEC) 10 MG tablet Take 1 tablet (10 mg total) by mouth daily. 90 tablet 3   Cholecalciferol (VITAMIN D3) 50 MCG (2000 UT) capsule TAKE 1 CAPSULE BY MOUTH EVERY DAY 100 capsule 3   [employer] SENNA PLUS 8.6-50 MG tablet Take 1 tablet by mouth 2 (two) times daily.     cyanocobalamin (VITAMIN B12) 1000 MCG/ML injection INJECT 1 ML INTO THE SKIN EVERY 14 DAYS 2 mL 19   diclofenac Sodium (VOLTAREN) 1 % GEL Apply 4 g topically daily as needed (pain).     docusate sodium (COLACE) 100 MG capsule Take 100 mg by mouth at bedtime.     hydrochlorothiazide (MICROZIDE) 12.5 MG capsule Take 1 capsule (12.5 mg total) by mouth daily. 90 capsule 3   hydrOXYzine (VISTARIL) 50 MG capsule Take 1-2 capsules (50-100 mg total) by mouth every 6 (six) hours as needed for anxiety. 60 capsule 5   pantoprazole (PROTONIX) 40 MG tablet Take 1 tablet (40 mg total) by mouth daily. 90 tablet 3   polyethylene glycol (MIRALAX / GLYCOLAX) 17 g packet Take 17 g by mouth daily as needed for mild constipation.     pramipexole (MIRAPEX) 0.75 MG tablet Take 1 tablet (0.75 mg total) by mouth 3 (three) times daily. 270 tablet 3   QUEtiapine (SEROQUEL) 100 MG tablet Take 1 tablet (100 mg total) by mouth at bedtime. 90 tablet 3   rosuvastatin (CRESTOR) 10 MG tablet Take 1 tablet (10 mg total) by mouth daily. 90 tablet 3   solifenacin (VESICARE) 5 MG tablet Take 1 tablet (5 mg total) by mouth daily. 90  tablet 3   buprenorphine (BUTRANS) 10 MCG/HR PTWK 1 patch once a week.     methylPREDNISolone (MEDROL DOSEPAK) 4 MG TBPK tablet As directed (Patient not taking: Reported on 02/04/2022) 21 tablet 0   No facility-administered medications prior to visit.    ROS: Review of Systems  Constitutional:  Positive for fatigue and fever. Negative for activity change, appetite change, chills and unexpected weight change.  HENT:  Positive for congestion and sore throat. Negative for mouth sores and sinus pressure.   Eyes:  Negative for visual disturbance.  Respiratory:  Positive for cough. Negative for chest tightness.   Gastrointestinal:  Negative for abdominal pain and nausea.  Genitourinary:  Negative for difficulty urinating, frequency and vaginal pain.  Musculoskeletal:  Positive for arthralgias, back pain and gait problem.  Skin:  Negative for pallor and rash.  Neurological:  Negative for dizziness, tremors, weakness, numbness and headaches.  Psychiatric/Behavioral:  Negative for confusion, sleep disturbance and suicidal ideas. The patient is nervous/anxious.     Objective:  BP (!) 152/82 (BP Location: Left Arm)   Pulse (!) 101   Temp 100.1 F (37.8 C) (Oral)   SpO2 95%   BP Readings from Last 3 Encounters:  02/04/22 (!) 152/82  11/18/21 138/87  11/05/21 (!) 148/80  Wt Readings from Last 3 Encounters:  11/18/21 151 lb (68.5 kg)  11/05/21 146 lb 12.8 oz (66.6 kg)  10/03/21 151 lb 9.6 oz (68.8 kg)    Physical Exam Constitutional:      General: She is not in acute distress.    Appearance: She is well-developed. She is obese. She is not ill-appearing.  HENT:     Head: Normocephalic.     Right Ear: External ear normal.     Left Ear: External ear normal.     Nose: Congestion and rhinorrhea present.     Mouth/Throat:     Pharynx: Posterior oropharyngeal erythema present.  Eyes:     General:        Right eye: No discharge.        Left eye: No discharge.      Conjunctiva/sclera: Conjunctivae normal.     Pupils: Pupils are equal, round, and reactive to light.  Neck:     Thyroid: No thyromegaly.     Vascular: No JVD.     Trachea: No tracheal deviation.  Cardiovascular:     Rate and Rhythm: Normal rate and regular rhythm.     Heart sounds: Normal heart sounds.  Pulmonary:     Effort: No respiratory distress.     Breath sounds: No stridor. No wheezing or rhonchi.  Abdominal:     General: Bowel sounds are normal. There is no distension.     Palpations: Abdomen is soft. There is no mass.     Tenderness: There is no abdominal tenderness. There is no guarding or rebound.  Musculoskeletal:        General: No tenderness.     Cervical back: Normal range of motion and neck supple. No rigidity.  Lymphadenopathy:     Cervical: No cervical adenopathy.  Skin:    Findings: No erythema or rash.  Neurological:     Mental Status: She is oriented to person, place, and time.     Cranial Nerves: No cranial nerve deficit.     Motor: No abnormal muscle tone.     Coordination: Coordination normal.     Gait: Gait abnormal.     Deep Tendon Reflexes: Reflexes normal.  Psychiatric:        Behavior: Behavior normal.        Thought Content: Thought content normal.        Judgment: Judgment normal.   In a w/c  Lab Results  Component Value Date   WBC 8.1 02/21/2021   HGB 12.4 02/21/2021   HCT 38.9 02/21/2021   PLT 289 02/21/2021   GLUCOSE 92 05/14/2021   CHOL 200 (H) 01/07/2021   TRIG 53 01/07/2021   HDL 79 01/07/2021   LDLDIRECT 109.8 02/10/2010   LDLCALC 111 (H) 01/07/2021   ALT 6 05/14/2021   AST 14 05/14/2021   NA 143 05/14/2021   K 3.9 05/14/2021   CL 104 05/14/2021   CREATININE 0.95 05/14/2021   BUN 12 05/14/2021   CO2 34 (H) 05/14/2021   TSH 0.67 06/25/2020   INR 0.97 02/07/2014   HGBA1C 5.8 05/14/2021    VAS [place] LOWER EXTREMITY VENOUS (DVT)  Result Date: 03/17/2021  Lower Venous DVT Study Patient Name:  CINTIA GLEED [facility]  Date of  Exam:   03/17/2021 Medical Rec #: [id number]          Accession #:    [id number] Date of Birth: 1945-04-03         Patient Gender: F Patient Age:  75 years Exam Location:  [facility] Procedure:      VAS [place] LOWER EXTREMITY VENOUS (DVT) Referring Phys: Cathlean Cower --------------------------------------------------------------------------------  Indications: Patient denies any shortness of breath or chest pain. Patient states swelling in both legs from knee down for the past two months.  Risk Factors: Surgery Total right knee replacement 2013. Comparison Study: NA Performing Technologist: Leavy Cella RDCS  Examination Guidelines: A complete evaluation includes B-mode imaging, spectral Doppler, color Doppler, and power Doppler as needed of all accessible portions of each vessel. Bilateral testing is considered an integral part of a complete examination. Limited examinations for reoccurring indications may be performed as noted. The reflux portion of the exam is performed with the patient in reverse Trendelenburg.  +---------+---------------+---------+-----------+----------+--------------+ RIGHT    CompressibilityPhasicitySpontaneityPropertiesThrombus Aging +---------+---------------+---------+-----------+----------+--------------+ CFV      Full           Yes      Yes                                 +---------+---------------+---------+-----------+----------+--------------+ SFJ      Full           Yes      Yes                                 +---------+---------------+---------+-----------+----------+--------------+ FV Prox  Full           Yes      Yes                                 +---------+---------------+---------+-----------+----------+--------------+ FV Mid   Full           Yes      Yes                                 +---------+---------------+---------+-----------+----------+--------------+ FV DistalFull           Yes      Yes                                  +---------+---------------+---------+-----------+----------+--------------+ PFV      Full                                                        +---------+---------------+---------+-----------+----------+--------------+ POP      Full           Yes      Yes                                 +---------+---------------+---------+-----------+----------+--------------+ PTV      Full           Yes      Yes                                 +---------+---------------+---------+-----------+----------+--------------+ PERO     Full  Yes      Yes                                 +---------+---------------+---------+-----------+----------+--------------+ Gastroc  Full                                                        +---------+---------------+---------+-----------+----------+--------------+ GSV      Full           Yes      Yes                                 +---------+---------------+---------+-----------+----------+--------------+   +---------+---------------+---------+-----------+----------+--------------+ LEFT     CompressibilityPhasicitySpontaneityPropertiesThrombus Aging +---------+---------------+---------+-----------+----------+--------------+ CFV      Full           Yes      Yes                                 +---------+---------------+---------+-----------+----------+--------------+ SFJ      Full           Yes      Yes                                 +---------+---------------+---------+-----------+----------+--------------+ FV Prox  Full           Yes      Yes                                 +---------+---------------+---------+-----------+----------+--------------+ FV Mid   Full           Yes      Yes                                 +---------+---------------+---------+-----------+----------+--------------+ FV DistalFull           Yes      Yes                                  +---------+---------------+---------+-----------+----------+--------------+ PFV      Full                                                        +---------+---------------+---------+-----------+----------+--------------+ POP      Full           Yes      Yes                                 +---------+---------------+---------+-----------+----------+--------------+ PTV      Full           Yes      Yes                                 +---------+---------------+---------+-----------+----------+--------------+  PERO     Full           Yes      Yes                                 +---------+---------------+---------+-----------+----------+--------------+ Gastroc  Full                                                        +---------+---------------+---------+-----------+----------+--------------+ GSV      Full           Yes      Yes                                 +---------+---------------+---------+-----------+----------+--------------+    Findings reported to Kallie Edward at 2:41pm.  Summary: BILATERAL: - No evidence of deep vein thrombosis seen in the lower extremities, bilaterally. -No evidence of popliteal cyst, bilaterally.   *See table(s) above for measurements and observations. Electronically signed by Berniece Salines DO on 03/17/2021 at 6:12:05 PM.    Final     Assessment & Plan:   Problem List Items Addressed This Visit     Upper respiratory infection - Primary    New COVID test Hycodan syr Zpac Medrol pac      Relevant Medications   azithromycin (ZITHROMAX Z-PAK) 250 MG tablet   Other Relevant Orders   POC COVID-19 BinaxNow (Completed)   Low back pain    Off Butrans      Cough   Relevant Orders   POC COVID-19 BinaxNow (Completed)   Asthma    Depo-medrol 80 mg IM      Relevant Medications   albuterol (PROVENTIL) (2.5 MG/3ML) 0.083% nebulizer solution      Meds ordered this encounter  Medications   albuterol (PROVENTIL) (2.5 MG/3ML) 0.083%  nebulizer solution    Sig: Take 3 mLs (2.5 mg total) by nebulization every 6 (six) hours as needed for wheezing or shortness of breath.    Dispense:  150 mL    Refill:  1   azithromycin (ZITHROMAX Z-PAK) 250 MG tablet    Sig: As directed    Dispense:  6 tablet    Refill:  0   HYDROcodone bit-homatropine (HYCODAN) 5-1.5 MG/5ML syrup    Sig: Take 5 mLs by mouth every 4 (four) hours as needed for up to 10 days for cough.    Dispense:  240 mL    Refill:  0   methylPREDNISolone acetate (DEPO-MEDROL) injection 80 mg      Follow-up: Return for a follow-up visit.  Walker Kehr, MD

## 2022-02-04 NOTE — Assessment & Plan Note (Signed)
Depo-medrol 80 mg IM 

## 2022-02-04 NOTE — Assessment & Plan Note (Signed)
Off Butrans

## 2022-02-04 NOTE — Progress Notes (Addendum)
Co sign for potc covid -19 AND Dep medrol./lmb  Medical screening examination/treatment/procedure(s) were performed by non-physician practitioner and as supervising physician I was immediately available for consultation/collaboration.  I agree with above. Lew Dawes, MD

## 2022-02-06 DIAGNOSIS — G3184 Mild cognitive impairment, so stated: Secondary | ICD-10-CM | POA: Diagnosis not present

## 2022-02-06 DIAGNOSIS — I10 Essential (primary) hypertension: Secondary | ICD-10-CM | POA: Diagnosis not present

## 2022-02-06 DIAGNOSIS — R42 Dizziness and giddiness: Secondary | ICD-10-CM | POA: Diagnosis not present

## 2022-02-06 DIAGNOSIS — G20A1 Parkinson's disease without dyskinesia, without mention of fluctuations: Secondary | ICD-10-CM | POA: Diagnosis not present

## 2022-02-06 DIAGNOSIS — M1990 Unspecified osteoarthritis, unspecified site: Secondary | ICD-10-CM | POA: Diagnosis not present

## 2022-02-06 DIAGNOSIS — J45909 Unspecified asthma, uncomplicated: Secondary | ICD-10-CM | POA: Diagnosis not present

## 2022-02-10 ENCOUNTER — Telehealth: Payer: Self-pay | Admitting: Internal Medicine

## 2022-02-10 DIAGNOSIS — J4540 Moderate persistent asthma, uncomplicated: Secondary | ICD-10-CM

## 2022-02-10 NOTE — Telephone Encounter (Signed)
[name], home health aide with [facility], called to report that patient was prescribed nebulizer solution at her last OV but patient does not have a nebulizer. They are requesting that a nebulizer be ordered for pt.   [name] can be reached back at [phone].

## 2022-02-11 NOTE — Telephone Encounter (Signed)
Please order nebulizer - DME Diagnosis asthma Thanks

## 2022-02-11 NOTE — Telephone Encounter (Signed)
Generated DME notified [name] and pt husband will fax order to [facility] @ [phone].Marland KitchenJohny Klein

## 2022-02-12 DIAGNOSIS — R42 Dizziness and giddiness: Secondary | ICD-10-CM | POA: Diagnosis not present

## 2022-02-12 DIAGNOSIS — G20A1 Parkinson's disease without dyskinesia, without mention of fluctuations: Secondary | ICD-10-CM | POA: Diagnosis not present

## 2022-02-12 DIAGNOSIS — M1990 Unspecified osteoarthritis, unspecified site: Secondary | ICD-10-CM | POA: Diagnosis not present

## 2022-02-12 DIAGNOSIS — G3184 Mild cognitive impairment, so stated: Secondary | ICD-10-CM | POA: Diagnosis not present

## 2022-02-12 DIAGNOSIS — I10 Essential (primary) hypertension: Secondary | ICD-10-CM | POA: Diagnosis not present

## 2022-02-12 DIAGNOSIS — J45909 Unspecified asthma, uncomplicated: Secondary | ICD-10-CM | POA: Diagnosis not present

## 2022-02-12 DIAGNOSIS — J4530 Mild persistent asthma, uncomplicated: Secondary | ICD-10-CM | POA: Diagnosis not present

## 2022-02-19 ENCOUNTER — Other Ambulatory Visit: Payer: Self-pay | Admitting: Internal Medicine

## 2022-02-21 DIAGNOSIS — J45909 Unspecified asthma, uncomplicated: Secondary | ICD-10-CM | POA: Diagnosis not present

## 2022-02-21 DIAGNOSIS — R42 Dizziness and giddiness: Secondary | ICD-10-CM | POA: Diagnosis not present

## 2022-02-21 DIAGNOSIS — M1990 Unspecified osteoarthritis, unspecified site: Secondary | ICD-10-CM | POA: Diagnosis not present

## 2022-02-21 DIAGNOSIS — G20A1 Parkinson's disease without dyskinesia, without mention of fluctuations: Secondary | ICD-10-CM | POA: Diagnosis not present

## 2022-02-21 DIAGNOSIS — G3184 Mild cognitive impairment, so stated: Secondary | ICD-10-CM | POA: Diagnosis not present

## 2022-02-21 DIAGNOSIS — I10 Essential (primary) hypertension: Secondary | ICD-10-CM | POA: Diagnosis not present

## 2022-03-09 ENCOUNTER — Encounter: Payer: Self-pay | Admitting: Internal Medicine

## 2022-03-09 ENCOUNTER — Ambulatory Visit (INDEPENDENT_AMBULATORY_CARE_PROVIDER_SITE_OTHER): Payer: Medicare Other | Admitting: Internal Medicine

## 2022-03-09 VITALS — BP 118/70 | HR 96 | Temp 97.6°F | Ht 61.0 in | Wt 153.0 lb

## 2022-03-09 DIAGNOSIS — R269 Unspecified abnormalities of gait and mobility: Secondary | ICD-10-CM | POA: Diagnosis not present

## 2022-03-09 DIAGNOSIS — I10 Essential (primary) hypertension: Secondary | ICD-10-CM | POA: Diagnosis not present

## 2022-03-09 DIAGNOSIS — J4540 Moderate persistent asthma, uncomplicated: Secondary | ICD-10-CM | POA: Diagnosis not present

## 2022-03-09 DIAGNOSIS — R739 Hyperglycemia, unspecified: Secondary | ICD-10-CM | POA: Diagnosis not present

## 2022-03-09 DIAGNOSIS — G20A1 Parkinson's disease without dyskinesia, without mention of fluctuations: Secondary | ICD-10-CM | POA: Diagnosis not present

## 2022-03-09 LAB — COMPREHENSIVE METABOLIC PANEL
ALT: 20 U/L (ref 0–35)
AST: 19 U/L (ref 0–37)
Albumin: 3.8 g/dL (ref 3.5–5.2)
Alkaline Phosphatase: 53 U/L (ref 39–117)
BUN: 21 mg/dL (ref 6–23)
CO2: 33 mEq/L — ABNORMAL HIGH (ref 19–32)
Calcium: 9.5 mg/dL (ref 8.4–10.5)
Chloride: 102 mEq/L (ref 96–112)
Creatinine, Ser: 1.01 mg/dL (ref 0.40–1.20)
GFR: 54.18 mL/min — ABNORMAL LOW (ref >60.00)
Glucose, Bld: 82 mg/dL (ref 70–99)
Potassium: 4 mEq/L (ref 3.5–5.1)
Sodium: 141 mEq/L (ref 135–145)
Total Bilirubin: 0.4 mg/dL (ref 0.2–1.2)
Total Protein: 7.1 g/dL (ref 6.0–8.3)

## 2022-03-09 LAB — CBC WITH DIFFERENTIAL/PLATELET
Basophils Absolute: 0 10*3/uL (ref 0.0–0.1)
Basophils Relative: 0.4 % (ref 0.0–3.0)
Eosinophils Absolute: 0.2 10*3/uL (ref 0.0–0.7)
Eosinophils Relative: 1.8 % (ref 0.0–5.0)
HCT: 40.5 % (ref 36.0–46.0)
Hemoglobin: 12.9 g/dL (ref 12.0–15.0)
Lymphocytes Relative: 30.9 % (ref 12.0–46.0)
Lymphs Abs: 2.5 10*3/uL (ref 0.7–4.0)
MCHC: 31.8 g/dL (ref 30.0–36.0)
MCV: 93.6 fl (ref 78.0–100.0)
Monocytes Absolute: 0.8 10*3/uL (ref 0.1–1.0)
Monocytes Relative: 9.6 % (ref 3.0–12.0)
Neutro Abs: 4.7 10*3/uL (ref 1.4–7.7)
Neutrophils Relative %: 57.3 % (ref 43.0–77.0)
Platelets: 298 10*3/uL (ref 150.0–400.0)
RBC: 4.33 Mil/uL (ref 3.87–5.11)
RDW: 14.5 % (ref 11.5–15.5)
WBC: 8.2 10*3/uL (ref 4.0–10.5)

## 2022-03-09 LAB — HEMOGLOBIN A1C: Hgb A1c MFr Bld: 6.1 % (ref 4.6–6.5)

## 2022-03-09 LAB — TSH: TSH: 1.18 u[IU]/mL (ref 0.35–5.50)

## 2022-03-09 NOTE — Progress Notes (Signed)
Subjective:  Patient ID: Mallory Klein, female    DOB: 10-Aug-1945  Age: 77 y.o. MRN: [id number]  CC: No chief complaint on file.   HPI Mallory Klein presents for LBP, asthma, HTN Mallory Klein needs help at home w/meds Meds are in a "pillpack" by [place] now  Outpatient Medications Prior to Visit  Medication Sig Dispense Refill   albuterol (PROVENTIL) (2.5 MG/3ML) 0.083% nebulizer solution take THREE mls (2.5 MG total) by NEBULIZER EVERY 6 HOURS AS NEEDED FOR WHEEZING OR shortness of breath 150 mL 1   celecoxib (CELEBREX) 200 MG capsule Take 1 capsule (200 mg total) by mouth 2 (two) times daily as needed for moderate pain. 60 capsule 2   cetirizine (ZYRTEC) 10 MG tablet Take 1 tablet (10 mg total) by mouth daily. 90 tablet 3   Cholecalciferol (VITAMIN D3) 50 MCG (2000 UT) capsule TAKE 1 CAPSULE BY MOUTH EVERY DAY 100 capsule 3   [employer] SENNA PLUS 8.6-50 MG tablet Take 1 tablet by mouth 2 (two) times daily.     cyanocobalamin (VITAMIN B12) 1000 MCG/ML injection INJECT 1 ML INTO THE SKIN EVERY 14 DAYS 2 mL 19   diclofenac Sodium (VOLTAREN) 1 % GEL Apply 4 g topically daily as needed (pain).     docusate sodium (COLACE) 100 MG capsule Take 100 mg by mouth at bedtime.     hydrochlorothiazide (MICROZIDE) 12.5 MG capsule Take 1 capsule (12.5 mg total) by mouth daily. 90 capsule 3   hydrOXYzine (VISTARIL) 50 MG capsule Take 1-2 capsules (50-100 mg total) by mouth every 6 (six) hours as needed for anxiety. 60 capsule 5   pantoprazole (PROTONIX) 40 MG tablet Take 1 tablet (40 mg total) by mouth daily. 90 tablet 3   polyethylene glycol (MIRALAX / GLYCOLAX) 17 g packet Take 17 g by mouth daily as needed for mild constipation.     pramipexole (MIRAPEX) 0.75 MG tablet Take 1 tablet (0.75 mg total) by mouth 3 (three) times daily. 270 tablet 3   QUEtiapine (SEROQUEL) 100 MG tablet Take 1 tablet (100 mg total) by mouth at bedtime. 90 tablet 3   rosuvastatin (CRESTOR) 10 MG tablet Take 1 tablet (10  mg total) by mouth daily. 90 tablet 3   solifenacin (VESICARE) 5 MG tablet Take 1 tablet (5 mg total) by mouth daily. 90 tablet 3   azithromycin (ZITHROMAX Z-PAK) 250 MG tablet As directed 6 tablet 0   No facility-administered medications prior to visit.    ROS: Review of Systems  Constitutional:  Positive for fatigue. Negative for activity change, appetite change, chills and unexpected weight change.  HENT:  Negative for congestion, mouth sores and sinus pressure.   Eyes:  Negative for visual disturbance.  Respiratory:  Positive for wheezing. Negative for cough and chest tightness.   Gastrointestinal:  Negative for abdominal pain and nausea.  Genitourinary:  Negative for difficulty urinating, frequency and vaginal pain.  Musculoskeletal:  Positive for back pain and gait problem.  Skin:  Negative for pallor and rash.  Neurological:  Positive for tremors and weakness. Negative for dizziness, numbness and headaches.  Psychiatric/Behavioral:  Positive for decreased concentration and sleep disturbance. Negative for confusion, dysphoric mood and suicidal ideas. The patient is nervous/anxious.     Objective:  BP 118/70 (BP Location: Left Arm, Patient Position: Sitting, Cuff Size: Normal)   Pulse 96   Temp 97.6 F (36.4 C) (Oral)   Ht '5\' 1"'$  (1.549 m)   Wt 153 lb (69.4 kg)  SpO2 98%   BMI 28.91 kg/m   BP Readings from Last 3 Encounters:  03/09/22 118/70  02/04/22 (!) 152/82  11/18/21 138/87    Wt Readings from Last 3 Encounters:  03/09/22 153 lb (69.4 kg)  11/18/21 151 lb (68.5 kg)  11/05/21 146 lb 12.8 oz (66.6 kg)    Physical Exam Constitutional:      General: She is not in acute distress.    Appearance: She is well-developed.  HENT:     Head: Normocephalic.     Right Ear: External ear normal.     Left Ear: External ear normal.     Nose: Nose normal.  Eyes:     General:        Right eye: No discharge.        Left eye: No discharge.     Conjunctiva/sclera:  Conjunctivae normal.     Pupils: Pupils are equal, round, and reactive to light.  Neck:     Thyroid: No thyromegaly.     Vascular: No JVD.     Trachea: No tracheal deviation.  Cardiovascular:     Rate and Rhythm: Normal rate and regular rhythm.     Heart sounds: Normal heart sounds.  Pulmonary:     Effort: No respiratory distress.     Breath sounds: No stridor. No wheezing.  Abdominal:     General: Bowel sounds are normal. There is no distension.     Palpations: Abdomen is soft. There is no mass.     Tenderness: There is no abdominal tenderness. There is no guarding or rebound.  Musculoskeletal:        General: No tenderness.     Cervical back: Normal range of motion and neck supple. No rigidity.  Lymphadenopathy:     Cervical: No cervical adenopathy.  Skin:    Findings: No erythema or rash.  Neurological:     Mental Status: She is oriented to person, place, and time.     Cranial Nerves: No cranial nerve deficit.     Motor: Weakness present. No abnormal muscle tone.     Coordination: Coordination abnormal.     Gait: Gait abnormal.     Deep Tendon Reflexes: Reflexes normal.  Psychiatric:        Behavior: Behavior normal.        Thought Content: Thought content normal.        Judgment: Judgment normal.    Hand tremor In a w/c  Lab Results  Component Value Date   WBC 8.1 02/21/2021   HGB 12.4 02/21/2021   HCT 38.9 02/21/2021   PLT 289 02/21/2021   GLUCOSE 92 05/14/2021   CHOL 200 (H) 01/07/2021   TRIG 53 01/07/2021   HDL 79 01/07/2021   LDLDIRECT 109.8 02/10/2010   LDLCALC 111 (H) 01/07/2021   ALT 6 05/14/2021   AST 14 05/14/2021   NA 143 05/14/2021   K 3.9 05/14/2021   CL 104 05/14/2021   CREATININE 0.95 05/14/2021   BUN 12 05/14/2021   CO2 34 (H) 05/14/2021   TSH 0.67 06/25/2020   INR 0.97 02/07/2014   HGBA1C 5.8 05/14/2021    VAS [place] LOWER EXTREMITY VENOUS (DVT)  Result Date: 03/17/2021  Lower Venous DVT Study Patient Name:  Mallory Klein  Date of  Exam:   03/17/2021 Medical Rec #: [id number]          Accession #:    [id number] Date of Birth: December 09, 1945         Patient  Gender: F Patient Age:   71 years Exam Location:  [facility] Procedure:      VAS [place] LOWER EXTREMITY VENOUS (DVT) Referring Phys: Cathlean Cower --------------------------------------------------------------------------------  Indications: Patient denies any shortness of breath or chest pain. Patient states swelling in both legs from knee down for the past two months.  Risk Factors: Surgery Total right knee replacement 2013. Comparison Study: NA Performing Technologist: Leavy Cella RDCS  Examination Guidelines: A complete evaluation includes B-mode imaging, spectral Doppler, color Doppler, and power Doppler as needed of all accessible portions of each vessel. Bilateral testing is considered an integral part of a complete examination. Limited examinations for reoccurring indications may be performed as noted. The reflux portion of the exam is performed with the patient in reverse Trendelenburg.  +---------+---------------+---------+-----------+----------+--------------+ RIGHT    CompressibilityPhasicitySpontaneityPropertiesThrombus Aging +---------+---------------+---------+-----------+----------+--------------+ CFV      Full           Yes      Yes                                 +---------+---------------+---------+-----------+----------+--------------+ SFJ      Full           Yes      Yes                                 +---------+---------------+---------+-----------+----------+--------------+ FV Prox  Full           Yes      Yes                                 +---------+---------------+---------+-----------+----------+--------------+ FV Mid   Full           Yes      Yes                                 +---------+---------------+---------+-----------+----------+--------------+ FV DistalFull           Yes      Yes                                  +---------+---------------+---------+-----------+----------+--------------+ PFV      Full                                                        +---------+---------------+---------+-----------+----------+--------------+ POP      Full           Yes      Yes                                 +---------+---------------+---------+-----------+----------+--------------+ PTV      Full           Yes      Yes                                 +---------+---------------+---------+-----------+----------+--------------+ PERO     Full  Yes      Yes                                 +---------+---------------+---------+-----------+----------+--------------+ Gastroc  Full                                                        +---------+---------------+---------+-----------+----------+--------------+ GSV      Full           Yes      Yes                                 +---------+---------------+---------+-----------+----------+--------------+   +---------+---------------+---------+-----------+----------+--------------+ LEFT     CompressibilityPhasicitySpontaneityPropertiesThrombus Aging +---------+---------------+---------+-----------+----------+--------------+ CFV      Full           Yes      Yes                                 +---------+---------------+---------+-----------+----------+--------------+ SFJ      Full           Yes      Yes                                 +---------+---------------+---------+-----------+----------+--------------+ FV Prox  Full           Yes      Yes                                 +---------+---------------+---------+-----------+----------+--------------+ FV Mid   Full           Yes      Yes                                 +---------+---------------+---------+-----------+----------+--------------+ FV DistalFull           Yes      Yes                                  +---------+---------------+---------+-----------+----------+--------------+ PFV      Full                                                        +---------+---------------+---------+-----------+----------+--------------+ POP      Full           Yes      Yes                                 +---------+---------------+---------+-----------+----------+--------------+ PTV      Full           Yes      Yes                                 +---------+---------------+---------+-----------+----------+--------------+  PERO     Full           Yes      Yes                                 +---------+---------------+---------+-----------+----------+--------------+ Gastroc  Full                                                        +---------+---------------+---------+-----------+----------+--------------+ GSV      Full           Yes      Yes                                 +---------+---------------+---------+-----------+----------+--------------+    Findings reported to Kallie Edward at 2:41pm.  Summary: BILATERAL: - No evidence of deep vein thrombosis seen in the lower extremities, bilaterally. -No evidence of popliteal cyst, bilaterally.   *See table(s) above for measurements and observations. Electronically signed by Berniece Salines DO on 03/17/2021 at 6:12:05 PM.    Final     Assessment & Plan:   Problem List Items Addressed This Visit       Cardiovascular and Mediastinum   Essential hypertension    Doing well        Respiratory   Asthma    HHN is at home        Nervous and Auditory   Parkinson disease ([place])    Meds are in a "pillpack" by [place] now PT offered and declined      Relevant Orders   TSH   Urinalysis   CBC with Differential/Platelet   Comprehensive metabolic panel     Other   Hyperglycemia    Check A1c      Relevant Orders   Hemoglobin A1c   Gait disorder - Primary    Meds are in a "pillpack" by [place] now PT offered and  declined      Relevant Orders   TSH   Urinalysis   CBC with Differential/Platelet   Comprehensive metabolic panel      No orders of the defined types were placed in this encounter.     Follow-up: Return in about 3 months (around 06/08/2022) for a follow-up visit.  Walker Kehr, MD

## 2022-03-09 NOTE — Assessment & Plan Note (Signed)
Meds are in a "pillpack" by [place] now PT offered and declined

## 2022-03-09 NOTE — Assessment & Plan Note (Signed)
Doing well 

## 2022-03-09 NOTE — Assessment & Plan Note (Signed)
Check A1c 

## 2022-03-09 NOTE — Assessment & Plan Note (Signed)
HHN is at home

## 2022-03-09 NOTE — Assessment & Plan Note (Signed)
Meds are in a "pillpack" by [place] now PT offered and declined

## 2022-03-16 ENCOUNTER — Telehealth: Payer: Self-pay | Admitting: Internal Medicine

## 2022-03-16 ENCOUNTER — Other Ambulatory Visit: Payer: Self-pay | Admitting: Internal Medicine

## 2022-03-16 DIAGNOSIS — G20A1 Parkinson's disease without dyskinesia, without mention of fluctuations: Secondary | ICD-10-CM

## 2022-03-16 DIAGNOSIS — R269 Unspecified abnormalities of gait and mobility: Secondary | ICD-10-CM

## 2022-03-16 DIAGNOSIS — J4540 Moderate persistent asthma, uncomplicated: Secondary | ICD-10-CM

## 2022-03-16 NOTE — Telephone Encounter (Signed)
Marsh Dolly from [facility] in [place] Number: [phone]  They have received complaints about patient not taking her medication.  [facility] had contacted Social Services.  Please call

## 2022-03-18 NOTE — Telephone Encounter (Signed)
Noted.  What Home care agency does the patient want to use? Thanks

## 2022-03-18 NOTE — Telephone Encounter (Signed)
Marsh Dolly calls again today with [facility]. Marsh Dolly is an adult social services advocate and has been informed of [facility] closing out PT's case. They are requesting a [facility] referral be put into another facility as PT is not able to keep up with her medication management. Marsh Dolly would be happy to share more information if needed.  CB: [phone]

## 2022-03-19 NOTE — Telephone Encounter (Signed)
Called pt she states she really doesn't know anyone else. They used to come out and help her with her medications making sure she is taking it and fixing it for her to take. She states whomever MD refer would be fine. Also want MD to know she will be having eye surgery @ [facility] on 04/02/22.Marland KitchenJohny Klein

## 2022-03-20 NOTE — Telephone Encounter (Signed)
Okay. Thank you.

## 2022-03-23 NOTE — Telephone Encounter (Signed)
Spoke with LaKita from [facility] and was able to inform her of the note from [facility] and that provider has sent in referral to [facility] on 03/20/2022. She stated she understood and has no other questions or concerns at this time.

## 2022-03-24 ENCOUNTER — Ambulatory Visit (INDEPENDENT_AMBULATORY_CARE_PROVIDER_SITE_OTHER): Payer: Medicare Other | Admitting: Family Medicine

## 2022-03-24 ENCOUNTER — Encounter: Payer: Self-pay | Admitting: Family Medicine

## 2022-03-24 VITALS — BP 123/81 | HR 102 | Ht 61.0 in | Wt 164.0 lb

## 2022-03-24 DIAGNOSIS — Z9181 History of falling: Secondary | ICD-10-CM | POA: Diagnosis not present

## 2022-03-24 DIAGNOSIS — G20A1 Parkinson's disease without dyskinesia, without mention of fluctuations: Secondary | ICD-10-CM

## 2022-03-24 DIAGNOSIS — R269 Unspecified abnormalities of gait and mobility: Secondary | ICD-10-CM | POA: Diagnosis not present

## 2022-03-24 MED ORDER — PRAMIPEXOLE DIHYDROCHLORIDE ER 0.75 MG PO TB24: 0.7500 mg | ORAL_TABLET | Freq: Two times a day (BID) | ORAL | 1 refills | Status: DC

## 2022-03-24 NOTE — Progress Notes (Signed)
Chief Complaint  Patient presents with   Room 2    Pt is here with her Husband. Pt states that she has gotten some help for her medications.     HISTORY OF PRESENT ILLNESS:  03/24/22 ALL:  Mallory Klein returns for follow up for primary parkinsonism. She was last seen by Dr Rexene Alberts 10/2021 and advised to continue pramipexole 0.'75mg'$  TID at 12p, 4p and 8p. Since, She reports only taking pramipexole twice, once upon waking around noon and second dose around 8pm. She is unsure if it has helped much with tremor. It is well tolerated. She states that she can not remember to take dose advised around 4pm. She reports caregiver helps with medication dosing. She is eating normally. She is sleeping well. No falls. She continues to use her walker. Tremor is about the same. She does tend to avoid social outings due to tremor.   11/18/2021 SA: Today, 11/18/21: She reports having bilateral knee pain.  She has not fallen recently, she uses a rolling walker.  She takes pramipexole 0.75 mg 3 times daily, she reports taking it at noon, then somewhere around 4 PM and then again at 6 PM on average but is not sure about the exact dose times, she does go to bed around 10 PM but does not sleep well and stays in bed for a prolonged period of time, gets out of bed around noon.  Sometimes she gets confused about her medications.  She has a medication pillbox with multiple slots for a full month but they have not really used this box consistently and do not typically fill it up again.  She tries to hydrate well with water.  She reports that she currently does not take her Seroquel, she also reports that she currently does not take a Butrans patch.  She is not sure about her hydrochlorothiazide.   03/25/2021 ALL: Mallory Klein is a 77 y.o. female here today for follow up for primary parkinsonism. During her last visit with Dr. Rexene Alberts, she was switched to Mirapex 0.125 mg 1 pill daily and titrate up to 3 pills 3 times daily. In  November she continued to have shaking and Dr. Rexene Alberts increased her dose to  0.5 mg TID. She was also referred to neuro rehab for PT.   She reports that she was unable to start PT due to cost. Her pain doctor also referred her but she is unable to afford the copayment. She feels that she is doing fairly well. She continues to note r hand >left hand tremor. She is unsure if Mirapex is helping much but reports that she is tolerating it well with no obvious adverse effects. She is sleeping well. Memory stable. She ambulates with Rolator. She has had 4 falls over the past year, none recently.   HISTORY (copied from previous note) Mallory Klein is a 77 year old right-handed woman with an underlying medical history of tremors, sleep apnea, iron deficiency anemia, vitamin D deficiency, low back pain, with history of lumbar degenerative spine disease, followed by pain management, reflux disease, hypertension, diverticulosis, asthma, and mild obesity, who presents for follow-up consultation of her Parkinson's disease, for her 69-monthcheckup.  The patient is accompanied by her husband again today. I last saw her on 05/22/2020, at which time she reported a few falls.  She had seen Dr. BRedmond Basemanfor anosmia.  He ordered a brain MRI.  She had a brain MRI with and without contrast on 05/27/2020 and I reviewed the results:  IMPRESSION: 1. No acute intracranial abnormality. 2. Moderately advanced but nonspecific cerebral white matter signal changes, most commonly due to chronic small vessel disease. 3. Olfactory bulbs are not identified suspicious for atrophy. 4. Mild paranasal sinus inflammation. 5. C3-C4 disc degeneration with mild cervical spinal stenosis.   She was not eating very well.  She had significant nausea.  She was also struggling with constipation and saw GI.   She was advised to continue with Sinemet CR 1 pill 3 times daily.  SA 11/19/2020: She reports worsening tremors, she has reduced her Sinemet CR to 1  pill twice daily.  She reports that we discussed it at the last visit.  We actually did not plan on reducing her Sinemet CR to twice daily.  At any rate, she is taking it twice daily, she has noticed changes in her posture and her balance.  She has had several falls.  She reports a history of syncope and collapse.  She has seen cardiology recently.  She reports that she lost consciousness.  She had a Holter monitor.  She is in the process of being reevaluated for her sleep apnea as she has a prior diagnosis but has not used her CPAP in years. She no longer has the machine even.  She is scheduled at [facility] to undergo a sleep study per cardiology.  She does report that her falls occurred when she stood up.  She had cardiac work-up in addition with echocardiogram and an EKG.    REVIEW OF SYSTEMS: Out of a complete 14 system review of symptoms, the patient complains only of the following symptoms, leg swelling, tremor, and all other reviewed systems are negative.   ALLERGIES: Allergies  Allergen Reactions   Ace Inhibitors     Patient doesn't recall reaction.  jkl   Benicar [Olmesartan]     It made her sick   Aspirin Other (See Comments)    bruising   Citalopram Hydrobromide Diarrhea     HOME MEDICATIONS: Outpatient Medications Prior to Visit  Medication Sig Dispense Refill   celecoxib (CELEBREX) 200 MG capsule Take 1 capsule (200 mg total) by mouth 2 (two) times daily as needed for moderate pain. 60 capsule 2   cetirizine (ZYRTEC) 10 MG tablet Take 1 tablet (10 mg total) by mouth daily. 90 tablet 3   Cholecalciferol (VITAMIN D3) 50 MCG (2000 UT) capsule TAKE 1 CAPSULE BY MOUTH EVERY DAY 100 capsule 3   [employer] SENNA PLUS 8.6-50 MG tablet Take 1 tablet by mouth 2 (two) times daily.     cyanocobalamin (VITAMIN B12) 1000 MCG/ML injection INJECT 1 ML INTO THE SKIN EVERY 14 DAYS 2 mL 19   diclofenac Sodium (VOLTAREN) 1 % GEL Apply 4 g topically daily as needed (pain).     docusate sodium  (COLACE) 100 MG capsule Take 100 mg by mouth at bedtime.     hydrochlorothiazide (MICROZIDE) 12.5 MG capsule TAKE ONE CAPSULE BY MOUTH EVERY DAY 30 capsule 10   hydrOXYzine (VISTARIL) 50 MG capsule Take 1-2 capsules (50-100 mg total) by mouth every 6 (six) hours as needed for anxiety. 60 capsule 5   pantoprazole (PROTONIX) 40 MG tablet Take 1 tablet (40 mg total) by mouth daily. 90 tablet 3   polyethylene glycol (MIRALAX / GLYCOLAX) 17 g packet Take 17 g by mouth daily as needed for mild constipation.     QUEtiapine (SEROQUEL) 100 MG tablet Take 1 tablet (100 mg total) by mouth at bedtime. 90 tablet 3  rosuvastatin (CRESTOR) 10 MG tablet Take 1 tablet (10 mg total) by mouth daily. 90 tablet 3   pramipexole (MIRAPEX) 0.75 MG tablet Take 1 tablet (0.75 mg total) by mouth 3 (three) times daily. 270 tablet 3   solifenacin (VESICARE) 5 MG tablet Take 1 tablet (5 mg total) by mouth daily. (Patient not taking: Reported on 03/24/2022) 90 tablet 3   albuterol (PROVENTIL) (2.5 MG/3ML) 0.083% nebulizer solution take THREE mls (2.5 MG total) by NEBULIZER EVERY 6 HOURS AS NEEDED FOR WHEEZING OR shortness of breath (Patient not taking: Reported on 03/24/2022) 150 mL 1   No facility-administered medications prior to visit.     PAST MEDICAL HISTORY: Past Medical History:  Diagnosis Date   Abnormal CBC 02/05/2015   [name] 12/16 new: normocytic anemia and thrombocytosis   Adjustment disorder with mixed anxiety and depressed mood 02/15/2007   Chronic  Chronic pain Grief, stress Effexor XR  Dad died in Feb 14, 2023   Asthma    Diverticulosis of colon (without mention of hemorrhage)    Esophageal stricture    Essential hypertension 01/26/2007   Chronic Verapamil    Falls    "I blackout"   GERD (gastroesophageal reflux disease) 02/15/2007   Chronic     Heart murmur    Hyperglycemia 03/02/2011   Mild     Insomnia disorder 12/08/2018   10/20 Carbid/Lev dose was increased - c/o hard time falling asleep (6 am)  getting up at 12 am, poor sleep. Try Temazepam 15-30 mg at 11-1:30 pm    Internal hemorrhoids without mention of complication    Iron deficiency anemia 04/23/2017   Mild neurocognitive disorder, likely due to Parkinson's disease 02/09/2019   OSA (obstructive sleep apnea) 02/14/2010   In lab study (May 2018): AHI 25. Only had 45 minutes of sleep secondary to frequent awakenings secondary to sleep apnea. autocpap 5-15 cm water.    Osteoarthritis    Parkinson disease 02/13/2014   2015 2017  Primidone - d/c 2019 Parkinson's: Sinmet IR. Dr Tat   Postherpetic trigeminal neuralgia 01/26/2007   Qualifier: Diagnosis of  By: Marca Ancona RMA, [name]     Shortness of breath dyspnea    with allergies   Thrombocytosis 05/24/2017   Urinary incontinence 01/26/2007   Chronic  10/17 Detrol [place]    Vitamin D deficiency 01/26/2019     PAST SURGICAL HISTORY: Past Surgical History:  Procedure Laterality Date   ABDOMINAL HYSTERECTOMY     BREAST CYST EXCISION Left 1980's   LUMBAR LAMINECTOMY/DECOMPRESSION MICRODISCECTOMY Bilateral 02/20/2014   Procedure: LUMBAR TWO THREE, LUMBAR THREE FOUR LUMBAR LAMINECTOMY/DECOMPRESSION MICRODISCECTOMY 2 LEVELS;  Surgeon: Eustace Moore, MD;  Location: [facility];  Service: Neurosurgery;  Laterality: Bilateral;   SPLENECTOMY     TIBIA FRACTURE SURGERY Right    TIBIA FRACTURE SURGERY Left    TOTAL KNEE ARTHROPLASTY Right 2013     FAMILY HISTORY: Family History  Problem Relation Age of Onset   Hypertension Mother    Diabetes type II Mother    Asthma Mother    Diabetes Mother    Dementia Mother    Prostate cancer Father    Mental illness Father        dementia   Stroke Brother        died in prison   Suicidality Son    Colon cancer Neg Hx    Rectal cancer Neg Hx    Parkinson's disease Neg Hx      SOCIAL HISTORY: Social History   Socioeconomic  History   Marital status: Married    Spouse name: Not on file   Number of children: 2   Years of education: 14    Highest education level: Associate degree: occupational, Hotel manager, or vocational program  Occupational History   Occupation: Retired    Comment: Network engineer, Youth worker   Tobacco Use   Smoking status: Never   Smokeless tobacco: Never  Vaping Use   Vaping Use: Never used  Substance and Sexual Activity   Alcohol use: Not Currently    Comment: wine on occ    Drug use: No   Sexual activity: Not Currently  Other Topics Concern   Not on file  Social History Narrative   Drink tea and coffee on occ   Right handed   1 story    Lives with spouse   Social Determinants of Health   Financial Resource Strain: Low Risk  (05/14/2021)   Overall Financial Resource Strain (CARDIA)    Difficulty of Paying Living Expenses: Not hard at all  Food Insecurity: No Food Insecurity (05/14/2021)   Hunger Vital Sign    Worried About Running Out of Food in the Last Year: Never true    [place] in the Last Year: Never true  Transportation Needs: No Transportation Needs (05/14/2021)   PRAPARE - Hydrologist (Medical): No    Lack of Transportation (Non-Medical): No  Physical Activity: Insufficiently Active (05/14/2021)   Exercise Vital Sign    Days of Exercise per Week: 1 day    Minutes of Exercise per Session: 60 min  Stress: No Stress Concern Present (05/14/2021)   [place]    Feeling of Stress : Not at all  Social Connections: [place] (05/14/2021)   Social Connection and Isolation Panel [NHANES]    Frequency of Communication with Friends and Family: More than three times a week    Frequency of Social Gatherings with Friends and Family: More than three times a week    Attends Religious Services: More than 4 times per year    Active Member of [employer] or Organizations: Yes    Attends Archivist Meetings: More than 4 times per year    Marital Status: Married  Human resources officer Violence:  Not At Risk (05/14/2021)   Humiliation, Afraid, Rape, and Kick questionnaire    Fear of Current or Ex-Partner: No    Emotionally Abused: No    Physically Abused: No    Sexually Abused: No     PHYSICAL EXAM  Vitals:   03/24/22 1341  BP: 123/81  Pulse: (!) 102  Weight: 164 lb (74.4 kg)  Height: '5\' 1"'$  (1.549 m)    Body mass index is 30.99 kg/m.  Generalized: Well developed, in no acute distress  Cardiology: normal rate and rhythm, no murmur auscultated  Respiratory: clear to auscultation bilaterally    Neurological examination  Mentation: Alert oriented to time, place, history taking. Follows all commands speech and language fluent Cranial nerve II-XII: Pupils were equal round reactive to light. Extraocular movements were full, visual field were full on confrontational test. Facial sensation and strength were normal. Uvula tongue midline. Head turning and shoulder shrug  were normal and symmetric. Motor: The motor testing reveals 5 over 5 strength of all 4 extremities with the exception of 4/5 on bilateral hip flexion. Good symmetric motor tone is noted throughout.  Sensory: Sensory testing is intact to soft touch on all 4  extremities. No evidence of extinction is noted.  Coordination: Cerebellar testing reveals good finger-nose-finger and heel-to-shin bilaterally.  Gait and station: Gait is cautious and wide with rollator walker. Tandem gait and romberg deferred.   Reflexes: Deep tendon reflexes are symmetric and normal bilaterally.    DIAGNOSTIC DATA (LABS, IMAGING, TESTING) - I reviewed patient records, labs, notes, testing and imaging myself where available.  Lab Results  Component Value Date   WBC 8.2 03/09/2022   HGB 12.9 03/09/2022   HCT 40.5 03/09/2022   MCV 93.6 03/09/2022   PLT 298.0 03/09/2022      Component Value Date/Time   NA 141 03/09/2022 1550   K 4.0 03/09/2022 1550   CL 102 03/09/2022 1550   CO2 33 (H) 03/09/2022 1550   GLUCOSE 82 03/09/2022 1550    BUN 21 03/09/2022 1550   CREATININE 1.01 03/09/2022 1550   CREATININE 0.91 09/11/2019 1451   CALCIUM 9.5 03/09/2022 1550   PROT 7.1 03/09/2022 1550   ALBUMIN 3.8 03/09/2022 1550   AST 19 03/09/2022 1550   ALT 20 03/09/2022 1550   ALKPHOS 53 03/09/2022 1550   BILITOT 0.4 03/09/2022 1550   GFRNONAA >60 02/21/2021 1550   GFRAA >60 02/28/2018 1544   Lab Results  Component Value Date   CHOL 200 (H) 01/07/2021   HDL 79 01/07/2021   LDLCALC 111 (H) 01/07/2021   LDLDIRECT 109.8 02/10/2010   TRIG 53 01/07/2021   CHOLHDL 2.5 01/07/2021   Lab Results  Component Value Date   HGBA1C 6.1 03/09/2022   Lab Results  Component Value Date   VITAMINB12 939 (H) 07/18/2020   Lab Results  Component Value Date   TSH 1.18 03/09/2022        No data to display              05/13/2018    4:11 PM  Montreal Cognitive Assessment   Visuospatial/ Executive (0/5) 3  Naming (0/3) 2  Attention: Read list of digits (0/2) 2  Attention: Read list of letters (0/1) 0  Attention: Serial 7 subtraction starting at 100 (0/3) 3  Language: Repeat phrase (0/2) 2  Language : Fluency (0/1) 0  Abstraction (0/2) 2  Delayed Recall (0/5) 1  Orientation (0/6) 6  Total 21     ASSESSMENT AND PLAN  77 y.o. year old female  has a past medical history of Abnormal CBC (02/05/2015), Adjustment disorder with mixed anxiety and depressed mood (02/15/2007), Asthma, Diverticulosis of colon (without mention of hemorrhage), Esophageal stricture, Essential hypertension (01/26/2007), Falls, GERD (gastroesophageal reflux disease) (02/15/2007), Heart murmur, Hyperglycemia (03/02/2011), Insomnia disorder (12/08/2018), Internal hemorrhoids without mention of complication, Iron deficiency anemia (04/23/2017), Mild neurocognitive disorder, likely due to Parkinson's disease (02/09/2019), OSA (obstructive sleep apnea) (02/14/2010), Osteoarthritis, Parkinson disease (02/09/2014), Postherpetic trigeminal neuralgia (01/26/2007),  Shortness of breath dyspnea, Thrombocytosis (05/24/2017), Urinary incontinence (01/26/2007), and Vitamin D deficiency (01/26/2019). here with    Parkinson's disease without dyskinesia or fluctuating manifestations  History of fall  Gait disorder  Azyah is doing fairly well. She is tolerating Mirapex without obvious adverse effects, however, has not noticed significant changes in tremor. She is not taking TID dosing as directed and feels this is too difficult to remember. I have advised we try pramipexole 0.'75mg'$  in an extended release formulation to see if this will help give more consistent control of symptoms. We have discussed difference in ER and IR dosing. We could also consider PT is she wishes. Fall precautions advised. Healthy lifestyle habits encouraged. I will  have her follow up with me in 6 months.   No orders of the defined types were placed in this encounter.  Meds ordered this encounter  Medications   Pramipexole Dihydrochloride 0.75 MG TB24    Sig: Take 1 tablet (0.75 mg total) by mouth in the morning and at bedtime.    Dispense:  180 tablet    Refill:  1    Order Specific Question:   Supervising Provider    Answer:   Melvenia Beam [id number]    I spent 30 minutes of face-to-face and non-face-to-face time with patient.  This included previsit chart review, lab review, study review, order entry, electronic health record documentation, patient education.    Debbora Presto, MSN, [name]-C 03/24/2022, 4:48 PM  [facility] [address], [place], [place] [address] [phone]

## 2022-03-24 NOTE — Patient Instructions (Signed)
Below is our plan:  We will try pramipexole extended release at the same dose of 0.'75mg'$  but I want you to take it every 12 hours to see if it will give a little better coverage.   Please make sure you are staying well hydrated. I recommend 50-60 ounces daily. Well balanced diet and regular exercise encouraged. Consistent sleep schedule with 6-8 hours recommended.   Please continue follow up with care team as directed.   Follow up with me in 6 months   You may receive a survey regarding today's visit. I encourage you to leave honest feed back as I do use this information to improve patient care. Thank you for seeing me today!

## 2022-03-30 ENCOUNTER — Other Ambulatory Visit: Payer: Self-pay | Admitting: Internal Medicine

## 2022-03-30 NOTE — Telephone Encounter (Signed)
Please refill as per office routine med refill policy (all routine meds to be refilled for 3 mo or monthly (per pt preference) up to one year from last visit, then month to month grace period for 3 mo, then further med refills will have to be denied) ? ?

## 2022-04-01 ENCOUNTER — Other Ambulatory Visit: Payer: Self-pay | Admitting: Internal Medicine

## 2022-04-01 NOTE — Telephone Encounter (Signed)
Ok to Pcp please

## 2022-05-06 ENCOUNTER — Ambulatory Visit: Payer: Medicare Other | Admitting: Internal Medicine

## 2022-05-12 ENCOUNTER — Telehealth: Payer: Self-pay

## 2022-05-12 NOTE — Telephone Encounter (Signed)
Called patient to schedule Medicare Annual Wellness Visit (AWV). Unable to reach patient.  Last date of AWV: 05/15/22  Please schedule an appointment at any time.   Norton Blizzard, [place] ([facility])  [place] [facility] [phone]

## 2022-05-14 ENCOUNTER — Telehealth: Payer: Self-pay

## 2022-05-14 NOTE — Telephone Encounter (Signed)
Called patient to schedule Medicare Annual Wellness Visit (AWV). Unable to reach patient.  Last date of AWV: 05/14/21  Please schedule an appointment at any time with [facility].   Norton Blizzard, [place] ([facility])  [place] [facility] [phone]

## 2022-05-25 ENCOUNTER — Other Ambulatory Visit: Payer: Self-pay | Admitting: Internal Medicine

## 2022-06-08 ENCOUNTER — Ambulatory Visit: Payer: Medicare Other | Admitting: Internal Medicine

## 2022-06-17 ENCOUNTER — Telehealth: Payer: Self-pay

## 2022-06-17 ENCOUNTER — Telehealth: Payer: Self-pay | Admitting: Internal Medicine

## 2022-06-17 NOTE — Telephone Encounter (Signed)
Pt has called and stated she has a cough and her asthma is acting up. She stated she had some of her cough med left and took it. She states she is now out of HYDROcodone bit-homatropine (HYCODAN) 5-1.5 MG/5ML syrup (Expired)  and wants to know if she can get a refill on it to help with her cough or if there is anything she can take OTC for her cough?  *Please call the pt with an update as she wanted a message sent back before making an apptmnt.

## 2022-06-17 NOTE — Telephone Encounter (Signed)
Contacted Mallory Klein to schedule their annual wellness visit. Appointment made for 06/24/2022.  [facility] Care Guide [facility] AWV TEAM Direct Dial: [phone]

## 2022-06-18 NOTE — Telephone Encounter (Signed)
Patient is wanting to know if the cough medication can be refilled - she is having a hard time sleeping without it. Call back number [phone]

## 2022-06-19 MED ORDER — HYDROCODONE BIT-HOMATROP MBR 5-1.5 MG/5ML PO SOLN: 5.0000 mL | Freq: Four times a day (QID) | ORAL | 0 refills | Status: AC | PRN

## 2022-06-19 NOTE — Addendum Note (Signed)
Addended by: Tresa Garter on: 06/19/2022 08:14 AM   Modules accepted: Orders

## 2022-06-19 NOTE — Telephone Encounter (Signed)
Done. Thank you.

## 2022-06-24 ENCOUNTER — Other Ambulatory Visit: Payer: Self-pay | Admitting: Internal Medicine

## 2022-06-24 ENCOUNTER — Ambulatory Visit (INDEPENDENT_AMBULATORY_CARE_PROVIDER_SITE_OTHER): Payer: Medicare Other

## 2022-06-24 VITALS — Ht 61.0 in | Wt 164.0 lb

## 2022-06-24 DIAGNOSIS — Z0000 Encounter for general adult medical examination without abnormal findings: Secondary | ICD-10-CM

## 2022-06-24 NOTE — Patient Instructions (Addendum)
Mallory Klein , Thank you for taking time to come for your Medicare Wellness Visit. I appreciate your ongoing commitment to your health goals. Please review the following plan we discussed and let me know if I can assist you in the future.   These are the goals we discussed:  Goals       Client understands the importance of follow-up with providers by attending scheduled visits (pt-stated)        This is a list of the screening recommended for you and due dates:  Health Maintenance  Topic Date Due   Colon Cancer Screening  03/31/2021   COVID-19 Vaccine (4 - 2023-24 season) 10/24/2021   DTaP/Tdap/Td vaccine (2 - Tdap) 07/20/2022   Flu Shot  09/24/2022   Medicare Annual Wellness Visit  06/24/2023   Pneumonia Vaccine  Completed   DEXA scan (bone density measurement)  Completed   Hepatitis C Screening: USPSTF Recommendation to screen - Ages 79-79 yo.  Completed   HPV Vaccine  Aged Out   Zoster (Shingles) Vaccine  Discontinued    Advanced directives: No; Advance directive discussed with you today. Even though you declined this today please call our office should you change your mind and we can give you the proper paperwork for you to fill out.  Conditions/risks identified: Yes; Parkinson's Disease  Next appointment: Follow up in one year for your annual wellness visit.   Preventive Care 42 Years and Older, Female Preventive care refers to lifestyle choices and visits with your health care provider that can promote health and wellness. What does preventive care include? A yearly physical exam. This is also called an annual well check. Dental exams once or twice a year. Routine eye exams. Ask your health care provider how often you should have your eyes checked. Personal lifestyle choices, including: Daily care of your teeth and gums. Regular physical activity. Eating a healthy diet. Avoiding tobacco and drug use. Limiting alcohol use. Practicing safe sex. Taking low-dose aspirin  every day. Taking vitamin and mineral supplements as recommended by your health care provider. What happens during an annual well check? The services and screenings done by your health care provider during your annual well check will depend on your age, overall health, lifestyle risk factors, and family history of disease. Counseling  Your health care provider may ask you questions about your: Alcohol use. Tobacco use. Drug use. Emotional well-being. Home and relationship well-being. Sexual activity. Eating habits. History of falls. Memory and ability to understand (cognition). Work and work Astronomer. Reproductive health. Screening  You may have the following tests or measurements: Height, weight, and BMI. Blood pressure. Lipid and cholesterol levels. These may be checked every 5 years, or more frequently if you are over 78 years old. Skin check. Lung cancer screening. You may have this screening every year starting at age 43 if you have a 30-pack-year history of smoking and currently smoke or have quit within the past 15 years. Fecal occult blood test (FOBT) of the stool. You may have this test every year starting at age 29. Flexible sigmoidoscopy or colonoscopy. You may have a sigmoidoscopy every 5 years or a colonoscopy every 10 years starting at age 106. Hepatitis C blood test. Hepatitis B blood test. Sexually transmitted disease (STD) testing. Diabetes screening. This is done by checking your blood sugar (glucose) after you have not eaten for a while (fasting). You may have this done every 1-3 years. Bone density scan. This is done to screen for osteoporosis. You  may have this done starting at age 50. Mammogram. This may be done every 1-2 years. Talk to your health care provider about how often you should have regular mammograms. Talk with your health care provider about your test results, treatment options, and if necessary, the need for more tests. Vaccines  Your health  care provider may recommend certain vaccines, such as: Influenza vaccine. This is recommended every year. Tetanus, diphtheria, and acellular pertussis (Tdap, Td) vaccine. You may need a Td booster every 10 years. Zoster vaccine. You may need this after age 11. Pneumococcal 13-valent conjugate (PCV13) vaccine. One dose is recommended after age 52. Pneumococcal polysaccharide (PPSV23) vaccine. One dose is recommended after age 4. Talk to your health care provider about which screenings and vaccines you need and how often you need them. This information is not intended to replace advice given to you by your health care provider. Make sure you discuss any questions you have with your health care provider. Document Released: 03/08/2015 Document Revised: 10/30/2015 Document Reviewed: 12/11/2014 Elsevier Interactive Patient Education  2017 [employer].  Fall Prevention in the Home Falls can cause injuries. They can happen to people of all ages. There are many things you can do to make your home safe and to help prevent falls. What can I do on the outside of my home? Regularly fix the edges of walkways and driveways and fix any cracks. Remove anything that might make you trip as you walk through a door, such as a raised step or threshold. Trim any bushes or trees on the path to your home. Use bright outdoor lighting. Clear any walking paths of anything that might make someone trip, such as rocks or tools. Regularly check to see if handrails are loose or broken. Make sure that both sides of any steps have handrails. Any raised decks and porches should have guardrails on the edges. Have any leaves, snow, or ice cleared regularly. Use sand or salt on walking paths during winter. Clean up any spills in your garage right away. This includes oil or grease spills. What can I do in the bathroom? Use night lights. Install grab bars by the toilet and in the tub and shower. Do not use towel bars as grab  bars. Use non-skid mats or decals in the tub or shower. If you need to sit down in the shower, use a plastic, non-slip stool. Keep the floor dry. Clean up any water that spills on the floor as soon as it happens. Remove soap buildup in the tub or shower regularly. Attach bath mats securely with double-sided non-slip rug tape. Do not have throw rugs and other things on the floor that can make you trip. What can I do in the bedroom? Use night lights. Make sure that you have a light by your bed that is easy to reach. Do not use any sheets or blankets that are too big for your bed. They should not hang down onto the floor. Have a firm chair that has side arms. You can use this for support while you get dressed. Do not have throw rugs and other things on the floor that can make you trip. What can I do in the kitchen? Clean up any spills right away. Avoid walking on wet floors. Keep items that you use a lot in easy-to-reach places. If you need to reach something above you, use a strong step stool that has a grab bar. Keep electrical cords out of the way. Do not use floor  polish or wax that makes floors slippery. If you must use wax, use non-skid floor wax. Do not have throw rugs and other things on the floor that can make you trip. What can I do with my stairs? Do not leave any items on the stairs. Make sure that there are handrails on both sides of the stairs and use them. Fix handrails that are broken or loose. Make sure that handrails are as long as the stairways. Check any carpeting to make sure that it is firmly attached to the stairs. Fix any carpet that is loose or worn. Avoid having throw rugs at the top or bottom of the stairs. If you do have throw rugs, attach them to the floor with carpet tape. Make sure that you have a light switch at the top of the stairs and the bottom of the stairs. If you do not have them, ask someone to add them for you. What else can I do to help prevent  falls? Wear shoes that: Do not have high heels. Have rubber bottoms. Are comfortable and fit you well. Are closed at the toe. Do not wear sandals. If you use a stepladder: Make sure that it is fully opened. Do not climb a closed stepladder. Make sure that both sides of the stepladder are locked into place. Ask someone to hold it for you, if possible. Clearly mark and make sure that you can see: Any grab bars or handrails. First and last steps. Where the edge of each step is. Use tools that help you move around (mobility aids) if they are needed. These include: Canes. Walkers. Scooters. Crutches. Turn on the lights when you go into a dark area. Replace any light bulbs as soon as they burn out. Set up your furniture so you have a clear path. Avoid moving your furniture around. If any of your floors are uneven, fix them. If there are any pets around you, be aware of where they are. Review your medicines with your doctor. Some medicines can make you feel dizzy. This can increase your chance of falling. Ask your doctor what other things that you can do to help prevent falls. This information is not intended to replace advice given to you by your health care provider. Make sure you discuss any questions you have with your health care provider. Document Released: 12/06/2008 Document Revised: 07/18/2015 Document Reviewed: 03/16/2014 Elsevier Interactive Patient Education  2017 [employer].

## 2022-06-24 NOTE — Progress Notes (Addendum)
I connected with  Mallory Klein on 06/24/22 by a audio enabled telemedicine application and verified that I am speaking with the correct person using two identifiers.  Patient Location: Home  Provider Location: Office/Clinic  I discussed the limitations of evaluation and management by telemedicine. The patient expressed understanding and agreed to proceed.  Subjective:   Mallory Klein is a 77 y.o. female who presents for Medicare Annual (Subsequent) preventive examination.  Review of Systems     Cardiac Risk Factors include: advanced age (>21men, >65 women)     Objective:    Today's Vitals   06/24/22 1432  Weight: 164 lb (74.4 kg)  Height: 5\' 1"  (1.549 m)  PainSc: 0-No pain   Body mass index is 30.99 kg/m.     06/24/2022    2:35 PM 04/29/2021    3:06 PM 02/21/2021    2:35 PM 12/10/2020    8:44 PM 07/27/2020    7:10 PM 07/21/2020   12:10 AM 03/27/2020    4:32 PM  Advanced Directives  Does Patient Have a Medical Advance Directive? No No No No No No No  Would patient like information on creating a medical advance directive? No - Patient declined No - Patient declined  Yes (MAU/Ambulatory/Procedural Areas - Information given)   No - Patient declined    Current Medications (verified) Outpatient Encounter Medications as of 06/24/2022  Medication Sig   celecoxib (CELEBREX) 200 MG capsule TAKE ONE CAPSULE BY MOUTH TWICE DAILY ptn moderate FOR PAIN   cetirizine (ZYRTEC) 10 MG tablet Take 1 tablet (10 mg total) by mouth daily.   Cholecalciferol (VITAMIN D3) 50 MCG (2000 UT) capsule TAKE 1 CAPSULE BY MOUTH EVERY DAY   [employer] SENNA PLUS 8.6-50 MG tablet Take 1 tablet by mouth 2 (two) times daily.   cyanocobalamin (VITAMIN B12) 1000 MCG/ML injection INJECT 1 ML INTO THE SKIN EVERY 14 DAYS   diclofenac Sodium (VOLTAREN) 1 % GEL Apply 4 g topically daily as needed (pain).   docusate sodium (COLACE) 100 MG capsule Take 100 mg by mouth at bedtime.   hydrochlorothiazide (MICROZIDE) 12.5  MG capsule TAKE ONE CAPSULE BY MOUTH EVERY DAY   HYDROcodone bit-homatropine (HYCODAN) 5-1.5 MG/5ML syrup Take 5 mLs by mouth every 6 (six) hours as needed for up to 10 days for cough.   hydrOXYzine (VISTARIL) 50 MG capsule Take 1-2 capsules (50-100 mg total) by mouth every 6 (six) hours as needed for anxiety.   pantoprazole (PROTONIX) 40 MG tablet Take 1 tablet (40 mg total) by mouth daily.   polyethylene glycol (MIRALAX / GLYCOLAX) 17 g packet Take 17 g by mouth daily as needed for mild constipation.   Pramipexole Dihydrochloride 0.75 MG TB24 Take 1 tablet (0.75 mg total) by mouth in the morning and at bedtime.   QUEtiapine (SEROQUEL) 100 MG tablet Take 1 tablet (100 mg total) by mouth at bedtime.   rosuvastatin (CRESTOR) 10 MG tablet Take 1 tablet (10 mg total) by mouth daily.   solifenacin (VESICARE) 5 MG tablet Take 1 tablet (5 mg total) by mouth daily. (Patient not taking: Reported on 03/24/2022)   [DISCONTINUED] donepezil (ARICEPT) 5 MG tablet Take 1 tablet (5 mg total) by mouth at bedtime. (Patient not taking: No sig reported)   [DISCONTINUED] venlafaxine XR (EFFEXOR-XR) 150 MG 24 hr capsule TAKE 1 CAPSULE BY MOUTH  EVERY DAY WITH BREAKFAST (Patient not taking: Reported on 07/21/2020)   No facility-administered encounter medications on file as of 06/24/2022.    Allergies (verified)  Ace inhibitors, Benicar [olmesartan], Aspirin, and Citalopram hydrobromide   History: Past Medical History:  Diagnosis Date   Abnormal CBC 02/05/2015   Dr Mosetta Putt 12/16 new: normocytic anemia and thrombocytosis   Adjustment disorder with mixed anxiety and depressed mood 02/15/2007   Chronic  Chronic pain Grief, stress Effexor XR  Dad died in 02-14-23   Asthma    Diverticulosis of colon (without mention of hemorrhage)    Esophageal stricture    Essential hypertension 01/26/2007   Chronic Verapamil    Falls    "I blackout"   GERD (gastroesophageal reflux disease) 02/15/2007   Chronic     Heart murmur     Hyperglycemia 03/02/2011   Mild     Insomnia disorder 12/08/2018   10/20 Carbid/Lev dose was increased - c/o hard time falling asleep (6 am) getting up at 12 am, poor sleep. Try Temazepam 15-30 mg at 11-1:30 pm    Internal hemorrhoids without mention of complication    Iron deficiency anemia 04/23/2017   Mild neurocognitive disorder, likely due to Parkinson's disease 02/09/2019   OSA (obstructive sleep apnea) 02/14/2010   In lab study (May 2018): AHI 25. Only had 45 minutes of sleep secondary to frequent awakenings secondary to sleep apnea. autocpap 5-15 cm water.    Osteoarthritis    Parkinson disease 02-13-14   2015 2017  Primidone - d/c 2019 Parkinson's: [facility] IR. Dr Tat   Postherpetic trigeminal neuralgia 01/26/2007   Qualifier: Diagnosis of  By: Charlsie Quest RMA, [name]     Shortness of breath dyspnea    with allergies   Thrombocytosis 05/24/2017   Urinary incontinence 01/26/2007   Chronic  10/17 Detrol LA    Vitamin D deficiency 01/26/2019   Past Surgical History:  Procedure Laterality Date   ABDOMINAL HYSTERECTOMY     BREAST CYST EXCISION Left 1980's   LUMBAR LAMINECTOMY/DECOMPRESSION MICRODISCECTOMY Bilateral 02/20/2014   Procedure: LUMBAR TWO THREE, LUMBAR THREE FOUR LUMBAR LAMINECTOMY/DECOMPRESSION MICRODISCECTOMY 2 LEVELS;  Surgeon: Tia Alert, MD;  Location: [facility];  Service: Neurosurgery;  Laterality: Bilateral;   SPLENECTOMY     TIBIA FRACTURE SURGERY Right    TIBIA FRACTURE SURGERY Left    TOTAL KNEE ARTHROPLASTY Right 2013   Family History  Problem Relation Age of Onset   Hypertension Mother    Diabetes type II Mother    Asthma Mother    Diabetes Mother    Dementia Mother    Prostate cancer Father    Mental illness Father        dementia   Stroke Brother        died in prison   Suicidality Son    Colon cancer Neg Hx    Rectal cancer Neg Hx    Parkinson's disease Neg Hx    Social History   Socioeconomic History   Marital status: Married    Spouse  name: Not on file   Number of children: 2   Years of education: 14   Highest education level: Tax adviser degree: occupational, Scientist, product/process development, or vocational program  Occupational History   Occupation: Retired    Comment: Diplomatic Services operational officer, Hospital doctor   Tobacco Use   Smoking status: Never   Smokeless tobacco: Never  Vaping Use   Vaping Use: Never used  Substance and Sexual Activity   Alcohol use: Not Currently    Comment: wine on occ    Drug use: No   Sexual activity: Not Currently  Other Topics Concern   Not on file  Social History  Narrative   Drink tea and coffee on occ   Right handed   1 story    Lives with spouse   Social Determinants of Health   Financial Resource Strain: Low Risk  (06/24/2022)   Overall Financial Resource Strain (CARDIA)    Difficulty of Paying Living Expenses: Not hard at all  Food Insecurity: No Food Insecurity (06/24/2022)   Hunger Vital Sign    Worried About Running Out of Food in the Last Year: Never true    Ran Out of Food in the Last Year: Never true  Transportation Needs: No Transportation Needs (06/24/2022)   PRAPARE - Administrator, Civil Service (Medical): No    Lack of Transportation (Non-Medical): No  Physical Activity: Inactive (06/24/2022)   Exercise Vital Sign    Days of Exercise per Week: 0 days    Minutes of Exercise per Session: 0 min  Stress: No Stress Concern Present (06/24/2022)   [employer] of Occupational Health - Occupational Stress Questionnaire    Feeling of Stress : Not at all  Social Connections: Socially Integrated (06/24/2022)   Social Connection and Isolation Panel [NHANES]    Frequency of Communication with Friends and Family: More than three times a week    Frequency of Social Gatherings with Friends and Family: More than three times a week    Attends Religious Services: More than 4 times per year    Active Member of [employer] or Organizations: Yes    Attends Engineer, structural: More than 4 times per year     Marital Status: Married    Tobacco Counseling Counseling given: Not Answered   Clinical Intake:  Pre-visit preparation completed: Yes  Pain : No/denies pain Pain Score: 0-No pain     BMI - recorded: 30.99 Nutritional Status: BMI > 30  Obese Nutritional Risks: None Diabetes: No  How often do you need to have someone help you when you read instructions, pamphlets, or other written materials from your doctor or pharmacy?: 1 - Never What is the last grade level you completed in school?: HSG  Diabetic? No  Interpreter Needed?: No  Information entered by :: Susie Cassette, LPN.   Activities of Daily Living    06/24/2022    2:40 PM  In your present state of health, do you have any difficulty performing the following activities:  Hearing? 0  Vision? 0  Difficulty concentrating or making decisions? 1  Walking or climbing stairs? 1  Dressing or bathing? 1  Doing errands, shopping? 1  Preparing Food and eating ? N  Using the Toilet? N  In the past six months, have you accidently leaked urine? Y  Do you have problems with loss of bowel control? N  Managing your Medications? Y  Managing your Finances? Y  Housekeeping or managing your Housekeeping? Y    Patient Care Team: Tresa Garter, MD as PCP - General (Internal Medicine) Josefine Class, MD as Referring Physician (Orthopedic Surgery) Hilarie Fredrickson, MD (Gastroenterology) Tia Alert, MD as Consulting Physician (Neurosurgery) Odette Fraction, MD (Inactive) as Consulting Physician (Pain Medicine) Malachy Mood, MD as Consulting Physician (Hematology) Pollyann Samples, NP as Nurse Practitioner (Nurse Practitioner) Tat, Octaviano Batty, DO as Consulting Physician (Neurology) Verdon Cummins, MD as Referring Physician (Physical Medicine and Rehabilitation) Holli Humbles, MD as Referring Physician (Ophthalmology) Roberts Gaudy, MD as Referring Physician (Ophthalmology)  Indicate any recent Medical Services you  may have received from other than [facility] providers in the past  year (date may be approximate).     Assessment:   This is a routine wellness examination for Mallorey.  Hearing/Vision screen Hearing Screening - Comments:: Denies hearing difficulties   Vision Screening - Comments:: Wears rx glasses - up to date with routine eye exams with [facility]   Dietary issues and exercise activities discussed: Current Exercise Habits: The patient does not participate in regular exercise at present, Exercise limited by: respiratory conditions(s);psychological condition(s);neurologic condition(s);orthopedic condition(s)   Goals Addressed               This Visit's Progress     Client understands the importance of follow-up with providers by attending scheduled visits (pt-stated)        Depression Screen    06/24/2022    2:37 PM 05/14/2021    3:16 PM 04/02/2021    2:25 PM 03/27/2020    4:31 PM 12/13/2019    3:55 PM 05/14/2017    2:11 PM 03/17/2016    4:01 PM  PHQ 2/9 Scores  PHQ - 2 Score 0 0 1 0 0 1 1  PHQ- 9 Score 6          Fall Risk    06/24/2022    2:36 PM 03/09/2022    2:44 PM 05/14/2021    3:16 PM 04/02/2021    2:25 PM 03/27/2020    4:32 PM  Fall Risk   Falls in the past year? 1 1 1 1 1   Number falls in past yr: 1 0 1 0 0  Injury with Fall? 1 0 1 1 0  Risk for fall due to : History of fall(s);Impaired balance/gait;Orthopedic patient Impaired balance/gait;History of fall(s)   No Fall Risks  Follow up Education provided;Falls prevention discussed Falls evaluation completed       FALL RISK PREVENTION PERTAINING TO THE HOME:  Any stairs in or around the home? No  If so, are there any without handrails? No  Home free of loose throw rugs in walkways, pet beds, electrical cords, etc? Yes  Adequate lighting in your home to reduce risk of falls? Yes   ASSISTIVE DEVICES UTILIZED TO PREVENT FALLS:  Life alert? No  Use of a cane, walker or w/c? Yes  Grab bars in the bathroom? Yes   Shower chair or bench in shower? Yes  Elevated toilet seat or a handicapped toilet? Yes   TIMED UP AND GO:  Was the test performed? No . Telephonic Visit  Cognitive Function:    06/24/2022    2:48 PM  MMSE - Mini Mental State Exam  Not completed: Unable to complete      05/13/2018    4:11 PM  Montreal Cognitive Assessment   Visuospatial/ Executive (0/5) 3  Naming (0/3) 2  Attention: Read list of digits (0/2) 2  Attention: Read list of letters (0/1) 0  Attention: Serial 7 subtraction starting at 100 (0/3) 3  Language: Repeat phrase (0/2) 2  Language : Fluency (0/1) 0  Abstraction (0/2) 2  Delayed Recall (0/5) 1  Orientation (0/6) 6  Total 21      Immunizations Immunization History  Administered Date(s) Administered   Fluad Quad(high Dose 65+) 10/19/2018, 12/13/2019, 12/05/2020, 11/05/2021   Influenza Split 11/20/2010, 12/04/2011   Influenza Whole 12/02/2007, 01/03/2009   Influenza, High Dose Seasonal PF 12/03/2015, 02/12/2017, 12/20/2017   Influenza,inj,Quad PF,6+ Mos 10/21/2012, 02/09/2014, 02/05/2015   Meningococcal polysaccharide vaccine (MPSV4) 03/29/2006   PFIZER(Purple Top)SARS-COV-2 Vaccination 04/16/2019, 05/10/2019, 02/13/2020   Pneumococcal Conjugate-13 04/25/2013  Pneumococcal Polysaccharide-23 03/29/2006, 03/02/2011   Td 07/19/2012   Zoster Recombinat (Shingrix) 10/30/2020   Zoster, Live 03/19/2011    TDAP status: Up to date  Flu Vaccine status: Up to date  Pneumococcal vaccine status: Up to date  Covid-19 vaccine status: Completed vaccines  Qualifies for Shingles Vaccine? Yes   Zostavax completed Yes    Shingrix Completed?: No.    Education has been provided regarding the importance of this vaccine. Patient has been advised to call insurance company to determine out of pocket expense if they have not yet received this vaccine. Advised may also receive vaccine at local pharmacy or Health Dept. Verbalized acceptance and understanding.  Screening  Tests Health Maintenance  Topic Date Due   COLONOSCOPY (Pts 45-63yrs Insurance coverage will need to be confirmed)  03/31/2021   COVID-19 Vaccine (4 - 2023-24 season) 10/24/2021   DTaP/Tdap/Td (2 - Tdap) 07/20/2022   INFLUENZA VACCINE  09/24/2022   Medicare Annual Wellness (AWV)  06/24/2023   Pneumonia Vaccine 40+ Years old  Completed   DEXA SCAN  Completed   Hepatitis C Screening  Completed   HPV VACCINES  Aged Out   Zoster Vaccines- Shingrix  Discontinued    Health Maintenance  Health Maintenance Due  Topic Date Due   COLONOSCOPY (Pts 45-13yrs Insurance coverage will need to be confirmed)  03/31/2021   COVID-19 Vaccine (4 - 2023-24 season) 10/24/2021    Colorectal cancer screening: Type of screening: Colonoscopy. Completed 03/31/2018. Repeat every 3 years  Mammogram status: Completed 08/03/2017. Repeat every year  Bone Density status: Completed 12/20/2017. Results reflect: Bone density results: OSTEOPOROSIS. Repeat every 2 years.  Lung Cancer Screening: (Low Dose CT Chest recommended if Age 75-80 years, 30 pack-year currently smoking OR have quit w/in 15years.) does not qualify.   Lung Cancer Screening Referral: no  Additional Screening:  Hepatitis C Screening: does qualify; Completed 09/09/2015  Vision Screening: Recommended annual ophthalmology exams for early detection of glaucoma and other disorders of the eye. Is the patient up to date with their annual eye exam?  Yes  Who is the provider or what is the name of the office in which the patient attends annual eye exams? [facility] If pt is not established with a provider, would they like to be referred to a provider to establish care? No .   Dental Screening: Recommended annual dental exams for proper oral hygiene  Community Resource Referral / Chronic Care Management: CRR required this visit?  No   CCM required this visit?  No      Plan:     I have personally reviewed and noted the following in the patient's  chart:   Medical and social history Use of alcohol, tobacco or illicit drugs  Current medications and supplements including opioid prescriptions. Patient is not currently taking opioid prescriptions. Functional ability and status Nutritional status Physical activity Advanced directives List of other physicians Hospitalizations, surgeries, and ER visits in previous 12 months Vitals Screenings to include cognitive, depression, and falls Referrals and appointments  In addition, I have reviewed and discussed with patient certain preventive protocols, quality metrics, and best practice recommendations. A written personalized care plan for preventive services as well as general preventive health recommendations were provided to patient.     Mickeal Needy, LPN   02/28/1094   Nurse Notes:  Patient has current diagnosis of cognitive impairment.  Patient is followed by neurology for ongoing assessment. Patient is unable to complete screening 6CIT or MMSE.  Medical screening examination/treatment/procedure(s) were performed by non-physician practitioner and as supervising physician I was immediately available for consultation/collaboration.  I agree with above. Jacinta Shoe, MD

## 2022-06-25 ENCOUNTER — Other Ambulatory Visit: Payer: Self-pay

## 2022-06-25 ENCOUNTER — Encounter: Payer: Self-pay | Admitting: Internal Medicine

## 2022-06-25 ENCOUNTER — Ambulatory Visit (INDEPENDENT_AMBULATORY_CARE_PROVIDER_SITE_OTHER): Payer: Medicare Other | Admitting: Internal Medicine

## 2022-06-25 VITALS — BP 122/78 | HR 89 | Temp 98.3°F | Ht 61.0 in | Wt 153.0 lb

## 2022-06-25 DIAGNOSIS — Z9889 Other specified postprocedural states: Secondary | ICD-10-CM

## 2022-06-25 DIAGNOSIS — E559 Vitamin D deficiency, unspecified: Secondary | ICD-10-CM | POA: Diagnosis not present

## 2022-06-25 DIAGNOSIS — M48061 Spinal stenosis, lumbar region without neurogenic claudication: Secondary | ICD-10-CM | POA: Diagnosis not present

## 2022-06-25 DIAGNOSIS — G20A1 Parkinson's disease without dyskinesia, without mention of fluctuations: Secondary | ICD-10-CM

## 2022-06-25 DIAGNOSIS — I10 Essential (primary) hypertension: Secondary | ICD-10-CM | POA: Diagnosis not present

## 2022-06-25 DIAGNOSIS — R739 Hyperglycemia, unspecified: Secondary | ICD-10-CM | POA: Diagnosis not present

## 2022-06-25 LAB — CBC WITH DIFFERENTIAL/PLATELET
Basophils Absolute: 0.1 10*3/uL (ref 0.0–0.1)
Basophils Relative: 0.6 % (ref 0.0–3.0)
Eosinophils Absolute: 0.5 10*3/uL (ref 0.0–0.7)
Eosinophils Relative: 5 % (ref 0.0–5.0)
HCT: 39.9 % (ref 36.0–46.0)
Hemoglobin: 13.1 g/dL (ref 12.0–15.0)
Lymphocytes Relative: 25.4 % (ref 12.0–46.0)
Lymphs Abs: 2.3 10*3/uL (ref 0.7–4.0)
MCHC: 32.7 g/dL (ref 30.0–36.0)
MCV: 90.3 fl (ref 78.0–100.0)
Monocytes Absolute: 1.1 10*3/uL — ABNORMAL HIGH (ref 0.1–1.0)
Monocytes Relative: 12.3 % — ABNORMAL HIGH (ref 3.0–12.0)
Neutro Abs: 5.1 10*3/uL (ref 1.4–7.7)
Neutrophils Relative %: 56.7 % (ref 43.0–77.0)
Platelets: 351 10*3/uL (ref 150.0–400.0)
RBC: 4.41 Mil/uL (ref 3.87–5.11)
RDW: 14.6 % (ref 11.5–15.5)
WBC: 9 10*3/uL (ref 4.0–10.5)

## 2022-06-25 LAB — COMPREHENSIVE METABOLIC PANEL
ALT: 19 U/L (ref 0–35)
AST: 26 U/L (ref 0–37)
Albumin: 3.9 g/dL (ref 3.5–5.2)
Alkaline Phosphatase: 60 U/L (ref 39–117)
BUN: 26 mg/dL — ABNORMAL HIGH (ref 6–23)
CO2: 35 mEq/L — ABNORMAL HIGH (ref 19–32)
Calcium: 9.5 mg/dL (ref 8.4–10.5)
Chloride: 100 mEq/L (ref 96–112)
Creatinine, Ser: 1.64 mg/dL — ABNORMAL HIGH (ref 0.40–1.20)
GFR: 30.22 mL/min — ABNORMAL LOW (ref >60.00)
Glucose, Bld: 100 mg/dL — ABNORMAL HIGH (ref 70–99)
Potassium: 4.3 mEq/L (ref 3.5–5.1)
Sodium: 141 mEq/L (ref 135–145)
Total Bilirubin: 0.7 mg/dL (ref 0.2–1.2)
Total Protein: 7.6 g/dL (ref 6.0–8.3)

## 2022-06-25 LAB — TSH: TSH: 0.55 u[IU]/mL (ref 0.35–5.50)

## 2022-06-25 LAB — HEMOGLOBIN A1C: Hgb A1c MFr Bld: 6.1 % (ref 4.6–6.5)

## 2022-06-25 MED ORDER — PRAMIPEXOLE DIHYDROCHLORIDE ER 0.75 MG PO TB24: 0.7500 mg | ORAL_TABLET | Freq: Two times a day (BID) | ORAL | 1 refills | Status: AC

## 2022-06-25 NOTE — Assessment & Plan Note (Signed)
LBP - under control w/prn meds/rest 

## 2022-06-25 NOTE — Assessment & Plan Note (Signed)
On Vit D 

## 2022-06-25 NOTE — Assessment & Plan Note (Signed)
BP Readings from Last 3 Encounters:  06/25/22 122/78  03/24/22 123/81  03/09/22 118/70

## 2022-06-25 NOTE — Assessment & Plan Note (Signed)
LBP - under control w/prn meds/rest

## 2022-06-25 NOTE — Progress Notes (Signed)
Subjective:  Patient ID: Mallory Klein, female    DOB: [birth year]  Age: 77 y.o. MRN: [id number]  CC: Follow-up (3 mnth f/u)   HPI Mallory Klein presents for Parkinson's, gait disorder - using a walker; LBP  Outpatient Medications Prior to Visit  Medication Sig Dispense Refill   celecoxib (CELEBREX) 200 MG capsule TAKE ONE CAPSULE BY MOUTH TWICE DAILY ptn moderate FOR PAIN 60 capsule 2   cetirizine (ZYRTEC) 10 MG tablet Take 1 tablet (10 mg total) by mouth daily. 90 tablet 3   Cholecalciferol (VITAMIN D3) 50 MCG (2000 UT) capsule TAKE 1 CAPSULE BY MOUTH EVERY DAY 100 capsule 3   [employer] SENNA PLUS 8.6-50 MG tablet Take 1 tablet by mouth 2 (two) times daily.     cyanocobalamin (VITAMIN B12) 1000 MCG/ML injection INJECT 1 ML INTO THE SKIN EVERY 14 DAYS 2 mL 19   diclofenac Sodium (VOLTAREN) 1 % GEL Apply 4 g topically daily as needed (pain).     docusate sodium (COLACE) 100 MG capsule Take 100 mg by mouth at bedtime.     hydrochlorothiazide (MICROZIDE) 12.5 MG capsule TAKE ONE CAPSULE BY MOUTH EVERY DAY 30 capsule 10   HYDROcodone bit-homatropine (HYCODAN) 5-1.5 MG/5ML syrup Take 5 mLs by mouth every 6 (six) hours as needed for up to 10 days for cough. 240 mL 0   hydrOXYzine (VISTARIL) 50 MG capsule Take 1-2 capsules (50-100 mg total) by mouth every 6 (six) hours as needed for anxiety. 60 capsule 5   pantoprazole (PROTONIX) 40 MG tablet Take 1 tablet (40 mg total) by mouth daily. 90 tablet 3   polyethylene glycol (MIRALAX / GLYCOLAX) 17 g packet Take 17 g by mouth daily as needed for mild constipation.     Pramipexole Dihydrochloride 0.75 MG TB24 Take 1 tablet (0.75 mg total) by mouth in the morning and at bedtime. 180 tablet 1   QUEtiapine (SEROQUEL) 100 MG tablet Take 1 tablet (100 mg total) by mouth at bedtime. 90 tablet 3   rosuvastatin (CRESTOR) 10 MG tablet Take 1 tablet (10 mg total) by mouth daily. 90 tablet 3   solifenacin (VESICARE) 5 MG tablet Take 1 tablet (5 mg total) by  mouth daily. (Patient not taking: Reported on 03/24/2022) 90 tablet 3   No facility-administered medications prior to visit.    ROS: Review of Systems  Constitutional:  Positive for fatigue and unexpected weight change. Negative for activity change, appetite change and chills.  HENT:  Negative for congestion, mouth sores and sinus pressure.   Eyes:  Negative for visual disturbance.  Respiratory:  Negative for cough and chest tightness.   Cardiovascular:  Negative for leg swelling.  Gastrointestinal:  Negative for abdominal pain and nausea.  Genitourinary:  Negative for difficulty urinating, frequency and vaginal pain.  Musculoskeletal:  Positive for back pain, gait problem, neck pain and neck stiffness.  Skin:  Negative for pallor and rash.  Neurological:  Positive for tremors and weakness. Negative for dizziness, numbness and headaches.  Psychiatric/Behavioral:  Negative for confusion, sleep disturbance and suicidal ideas. The patient is nervous/anxious.     Objective:  BP 122/78 (BP Location: Left Arm, Patient Position: Sitting, Cuff Size: Large)   Pulse 89   Temp 98.3 F (36.8 C) (Oral)   Ht 5\' 1"  (1.549 m)   Wt 153 lb (69.4 kg)   SpO2 96%   BMI 28.91 kg/m   BP Readings from Last 3 Encounters:  06/25/22 122/78  03/24/22 123/81  03/09/22 118/70  Wt Readings from Last 3 Encounters:  06/25/22 153 lb (69.4 kg)  06/24/22 164 lb (74.4 kg)  03/24/22 164 lb (74.4 kg)    Physical Exam Constitutional:      General: She is not in acute distress.    Appearance: Normal appearance. She is well-developed.  HENT:     Head: Normocephalic.     Right Ear: External ear normal.     Left Ear: External ear normal.     Nose: Nose normal.  Eyes:     General:        Right eye: No discharge.        Left eye: No discharge.     Conjunctiva/sclera: Conjunctivae normal.     Pupils: Pupils are equal, round, and reactive to light.  Neck:     Thyroid: No thyromegaly.     Vascular: No  JVD.     Trachea: No tracheal deviation.  Cardiovascular:     Rate and Rhythm: Normal rate and regular rhythm.     Heart sounds: Normal heart sounds.  Pulmonary:     Effort: No respiratory distress.     Breath sounds: No stridor. No wheezing.  Abdominal:     General: Bowel sounds are normal. There is no distension.     Palpations: Abdomen is soft. There is no mass.     Tenderness: There is no abdominal tenderness. There is no guarding or rebound.  Musculoskeletal:        General: No tenderness.     Cervical back: Normal range of motion and neck supple. No rigidity.  Lymphadenopathy:     Cervical: No cervical adenopathy.  Skin:    Findings: No erythema or rash.  Neurological:     Cranial Nerves: No cranial nerve deficit.     Motor: No abnormal muscle tone.     Coordination: Coordination normal.     Deep Tendon Reflexes: Reflexes normal.  Psychiatric:        Behavior: Behavior normal.        Thought Content: Thought content normal.        Judgment: Judgment normal.     Lab Results  Component Value Date   WBC 8.2 03/09/2022   HGB 12.9 03/09/2022   HCT 40.5 03/09/2022   PLT 298.0 03/09/2022   GLUCOSE 82 03/09/2022   CHOL 200 (H) 01/07/2021   TRIG 53 01/07/2021   HDL 79 01/07/2021   LDLDIRECT 109.8 02/10/2010   LDLCALC 111 (H) 01/07/2021   ALT 20 03/09/2022   AST 19 03/09/2022   NA 141 03/09/2022   K 4.0 03/09/2022   CL 102 03/09/2022   CREATININE 1.01 03/09/2022   BUN 21 03/09/2022   CO2 33 (H) 03/09/2022   TSH 1.18 03/09/2022   INR 0.97 02/07/2014   HGBA1C 6.1 03/09/2022    VAS [place] LOWER EXTREMITY VENOUS (DVT)  Result Date: 03/17/2021  Lower Venous DVT Study Patient Name:  Mallory Klein [facility]  Date of Exam:   03/17/2021 Medical Rec #: [id number]          Accession #:    [id number] Date of Birth: 04-Nov-1945         Patient Gender: F Patient Age:   63 years Exam Location:  [facility] Procedure:      VAS [place] LOWER EXTREMITY VENOUS (DVT) Referring Phys: Oliver Barre  --------------------------------------------------------------------------------  Indications: Patient denies any shortness of breath or chest pain. Patient states swelling in both legs from knee down for the past two months.  Risk  Factors: Surgery Total right knee replacement 2013. Comparison Study: NA Performing Technologist: Jeryl Columbia RDCS  Examination Guidelines: A complete evaluation includes B-mode imaging, spectral Doppler, color Doppler, and power Doppler as needed of all accessible portions of each vessel. Bilateral testing is considered an integral part of a complete examination. Limited examinations for reoccurring indications may be performed as noted. The reflux portion of the exam is performed with the patient in reverse Trendelenburg.  +---------+---------------+---------+-----------+----------+--------------+ RIGHT    CompressibilityPhasicitySpontaneityPropertiesThrombus Aging +---------+---------------+---------+-----------+----------+--------------+ CFV      Full           Yes      Yes                                 +---------+---------------+---------+-----------+----------+--------------+ SFJ      Full           Yes      Yes                                 +---------+---------------+---------+-----------+----------+--------------+ FV Prox  Full           Yes      Yes                                 +---------+---------------+---------+-----------+----------+--------------+ FV Mid   Full           Yes      Yes                                 +---------+---------------+---------+-----------+----------+--------------+ FV DistalFull           Yes      Yes                                 +---------+---------------+---------+-----------+----------+--------------+ PFV      Full                                                        +---------+---------------+---------+-----------+----------+--------------+ POP      Full           Yes      Yes                                  +---------+---------------+---------+-----------+----------+--------------+ PTV      Full           Yes      Yes                                 +---------+---------------+---------+-----------+----------+--------------+ PERO     Full           Yes      Yes                                 +---------+---------------+---------+-----------+----------+--------------+ Gastroc  Full                                                        +---------+---------------+---------+-----------+----------+--------------+  GSV      Full           Yes      Yes                                 +---------+---------------+---------+-----------+----------+--------------+   +---------+---------------+---------+-----------+----------+--------------+ LEFT     CompressibilityPhasicitySpontaneityPropertiesThrombus Aging +---------+---------------+---------+-----------+----------+--------------+ CFV      Full           Yes      Yes                                 +---------+---------------+---------+-----------+----------+--------------+ SFJ      Full           Yes      Yes                                 +---------+---------------+---------+-----------+----------+--------------+ FV Prox  Full           Yes      Yes                                 +---------+---------------+---------+-----------+----------+--------------+ FV Mid   Full           Yes      Yes                                 +---------+---------------+---------+-----------+----------+--------------+ FV DistalFull           Yes      Yes                                 +---------+---------------+---------+-----------+----------+--------------+ PFV      Full                                                        +---------+---------------+---------+-----------+----------+--------------+ POP      Full           Yes      Yes                                  +---------+---------------+---------+-----------+----------+--------------+ PTV      Full           Yes      Yes                                 +---------+---------------+---------+-----------+----------+--------------+ PERO     Full           Yes      Yes                                 +---------+---------------+---------+-----------+----------+--------------+ Gastroc  Full                                                        +---------+---------------+---------+-----------+----------+--------------+  GSV      Full           Yes      Yes                                 +---------+---------------+---------+-----------+----------+--------------+    Findings reported to Melvenia Needles at 2:41pm.  Summary: BILATERAL: - No evidence of deep vein thrombosis seen in the lower extremities, bilaterally. -No evidence of popliteal cyst, bilaterally.   *See table(s) above for measurements and observations. Electronically signed by Thomasene Ripple DO on 03/17/2021 at 6:12:05 PM.    Final     Assessment & Plan:   Problem List Items Addressed This Visit     Essential hypertension - Primary    BP Readings from Last 3 Encounters:  06/25/22 122/78  03/24/22 123/81  03/09/22 118/70        Relevant Orders   Urinalysis   TSH   CBC with Differential/Platelet   Comprehensive metabolic panel   Hemoglobin A1c   Hyperglycemia   Relevant Orders   Hemoglobin A1c   Parkinson disease (HCC)    On Mirapex, off Sinemet Using a walker      Relevant Orders   Urinalysis   TSH   CBC with Differential/Platelet   Comprehensive metabolic panel   Hemoglobin A1c   S/P lumbar laminectomy    LBP - under control w/prn meds/rest      Vitamin D deficiency    On Vit D      Spinal stenosis of lumbar region without neurogenic claudication    LBP - under control w/prn meds/rest      Relevant Orders   Urinalysis   TSH   CBC with Differential/Platelet   Comprehensive metabolic panel    Hemoglobin A1c      No orders of the defined types were placed in this encounter.     Follow-up: Return in about 3 months (around 09/25/2022) for a follow-up visit.  Sonda Primes, MD

## 2022-06-25 NOTE — Assessment & Plan Note (Addendum)
On Mirapex, off Sinemet Using a walker

## 2022-06-26 LAB — URINALYSIS, ROUTINE W REFLEX MICROSCOPIC
Bilirubin Urine: NEGATIVE
Hgb urine dipstick: NEGATIVE
Ketones, ur: NEGATIVE
Leukocytes,Ua: NEGATIVE
Nitrite: NEGATIVE
Specific Gravity, Urine: 1.015 (ref 1.000–1.030)
Total Protein, Urine: 30 — AB
Urine Glucose: NEGATIVE
Urobilinogen, UA: 4 — AB (ref 0.0–1.0)
pH: 6.5 (ref 5.0–8.0)

## 2022-06-30 ENCOUNTER — Encounter: Payer: Self-pay | Admitting: Internal Medicine

## 2022-06-30 ENCOUNTER — Other Ambulatory Visit: Payer: Self-pay | Admitting: Internal Medicine

## 2022-06-30 DIAGNOSIS — N183 Chronic kidney disease, stage 3 unspecified: Secondary | ICD-10-CM | POA: Insufficient documentation

## 2022-08-03 ENCOUNTER — Telehealth: Payer: Self-pay

## 2022-08-03 NOTE — Telephone Encounter (Signed)
Pts husband has called and stated the pt is very constipated and impacted. Opt has tried and enema and it has helped very little... Pt is wanting to know what else she can try to help her.

## 2022-08-03 NOTE — Telephone Encounter (Signed)
Mix the entire 238-gram bottle of MiraLAX in 64 oz of Gatorade. Shake/mix the solution until the MiraLAX has dissolved. o Drink one glass (8 oz.) of the MiraLAX solution every 10-15 minutes until the entire solution is gone or start to have BMs. Thx

## 2022-08-04 NOTE — Telephone Encounter (Signed)
Called pt/husband he stated they used magnesium citrate and it done its job.Marland KitchenRaechel Klein

## 2022-08-20 ENCOUNTER — Encounter: Payer: Self-pay | Admitting: Internal Medicine

## 2022-08-28 ENCOUNTER — Telehealth: Payer: Self-pay | Admitting: Internal Medicine

## 2022-08-28 NOTE — Telephone Encounter (Signed)
Patient would like to know if her provider recommends anything OTC for constipation. She would like a call back at [phone].

## 2022-08-31 NOTE — Telephone Encounter (Signed)
Use MiraLAX 1 scoop in water or coffee twice a day with Senokot S 1 twice a day.  Thank you

## 2022-09-02 ENCOUNTER — Telehealth: Payer: Self-pay | Admitting: Internal Medicine

## 2022-09-02 DIAGNOSIS — G8929 Other chronic pain: Secondary | ICD-10-CM

## 2022-09-02 DIAGNOSIS — G20A1 Parkinson's disease without dyskinesia, without mention of fluctuations: Secondary | ICD-10-CM

## 2022-09-02 NOTE — Telephone Encounter (Signed)
Patient called and said that she needs her rx for a power lift chair, clinical notes and diagnosis codes faxed to [facility] Fax:  [phone] Phone:  [phone]

## 2022-09-02 NOTE — Telephone Encounter (Signed)
LVM for pt to call clinic fir instructions per provider of " Use MiraLAX 1 scoop in water or coffee twice a day with Senokot S 1 twice a day.  Thank you

## 2022-09-02 NOTE — Telephone Encounter (Signed)
Pls advise ok for power lift chair. Will need diagnosis.Marland KitchenRaechel Chute

## 2022-09-04 NOTE — Telephone Encounter (Signed)
Notified husband order has been faxed to [facility].Marland KitchenRaechel Chute

## 2022-09-04 NOTE — Telephone Encounter (Signed)
OK Dx LBP and Parkinson disease Thx

## 2022-09-06 ENCOUNTER — Other Ambulatory Visit: Payer: Self-pay

## 2022-09-06 ENCOUNTER — Emergency Department (HOSPITAL_BASED_OUTPATIENT_CLINIC_OR_DEPARTMENT_OTHER)
Admission: EM | Admit: 2022-09-06 | Discharge: 2022-09-06 | Disposition: A | Discharge status: Home / Self Care | Payer: Medicare Other | Attending: Emergency Medicine | Admitting: Emergency Medicine

## 2022-09-06 ENCOUNTER — Emergency Department (HOSPITAL_BASED_OUTPATIENT_CLINIC_OR_DEPARTMENT_OTHER): Payer: Medicare Other

## 2022-09-06 ENCOUNTER — Encounter (HOSPITAL_BASED_OUTPATIENT_CLINIC_OR_DEPARTMENT_OTHER): Payer: Self-pay | Admitting: Emergency Medicine

## 2022-09-06 DIAGNOSIS — N281 Cyst of kidney, acquired: Secondary | ICD-10-CM | POA: Diagnosis not present

## 2022-09-06 DIAGNOSIS — G20A1 Parkinson's disease without dyskinesia, without mention of fluctuations: Secondary | ICD-10-CM | POA: Insufficient documentation

## 2022-09-06 DIAGNOSIS — D72829 Elevated white blood cell count, unspecified: Secondary | ICD-10-CM | POA: Diagnosis not present

## 2022-09-06 DIAGNOSIS — R1084 Generalized abdominal pain: Secondary | ICD-10-CM

## 2022-09-06 DIAGNOSIS — R109 Unspecified abdominal pain: Secondary | ICD-10-CM | POA: Diagnosis present

## 2022-09-06 DIAGNOSIS — K5900 Constipation, unspecified: Secondary | ICD-10-CM | POA: Diagnosis not present

## 2022-09-06 DIAGNOSIS — K8689 Other specified diseases of pancreas: Secondary | ICD-10-CM | POA: Diagnosis not present

## 2022-09-06 LAB — LACTIC ACID, PLASMA: Lactic Acid, Venous: 0.8 mmol/L (ref 0.5–1.9)

## 2022-09-06 LAB — URINALYSIS, ROUTINE W REFLEX MICROSCOPIC
Bacteria, UA: NONE SEEN
Bilirubin Urine: NEGATIVE
Glucose, UA: NEGATIVE mg/dL
Ketones, ur: NEGATIVE mg/dL
Leukocytes,Ua: NEGATIVE
Nitrite: NEGATIVE
Protein, ur: 30 mg/dL — AB
Specific Gravity, Urine: >1.046 — ABNORMAL HIGH (ref 1.005–1.030)
pH: 6.5 (ref 5.0–8.0)

## 2022-09-06 LAB — COMPREHENSIVE METABOLIC PANEL
ALT: 8 U/L (ref 0–44)
AST: 20 U/L (ref 15–41)
Albumin: 4 g/dL (ref 3.5–5.0)
Alkaline Phosphatase: 51 U/L (ref 38–126)
Anion gap: 9 (ref 5–15)
BUN: 17 mg/dL (ref 8–23)
CO2: 30 mmol/L (ref 22–32)
Calcium: 9.7 mg/dL (ref 8.9–10.3)
Chloride: 98 mmol/L (ref 98–111)
Creatinine, Ser: 1.31 mg/dL — ABNORMAL HIGH (ref 0.44–1.00)
GFR, Estimated: 42 mL/min — ABNORMAL LOW (ref >60)
Glucose, Bld: 114 mg/dL — ABNORMAL HIGH (ref 70–99)
Potassium: 4.5 mmol/L (ref 3.5–5.1)
Sodium: 137 mmol/L (ref 135–145)
Total Bilirubin: 0.7 mg/dL (ref 0.3–1.2)
Total Protein: 7.5 g/dL (ref 6.5–8.1)

## 2022-09-06 LAB — CBC
HCT: 39.6 % (ref 36.0–46.0)
Hemoglobin: 12.8 g/dL (ref 12.0–15.0)
MCH: 28.9 pg (ref 26.0–34.0)
MCHC: 32.3 g/dL (ref 30.0–36.0)
MCV: 89.4 fL (ref 80.0–100.0)
Platelets: 404 10*3/uL — ABNORMAL HIGH (ref 150–400)
RBC: 4.43 MIL/uL (ref 3.87–5.11)
RDW: 15.5 % (ref 11.5–15.5)
WBC: 10.8 10*3/uL — ABNORMAL HIGH (ref 4.0–10.5)
nRBC: 0 % (ref 0.0–0.2)

## 2022-09-06 LAB — LIPASE, BLOOD: Lipase: <10 U/L — ABNORMAL LOW (ref 11–51)

## 2022-09-06 MED ORDER — FENTANYL CITRATE PF 50 MCG/ML IJ SOSY
50.0000 ug | PREFILLED_SYRINGE | Freq: Once | INTRAMUSCULAR | Status: AC
  Administered 2022-09-06: 50 ug via INTRAVENOUS
  Filled 2022-09-06: qty 1

## 2022-09-06 MED ORDER — POLYETHYLENE GLYCOL 3350 17 G PO PACK: 17.0000 g | PACK | Freq: Every day | ORAL | 0 refills | Status: DC

## 2022-09-06 MED ORDER — IOHEXOL 300 MG/ML  SOLN
100.0000 mL | Freq: Once | INTRAMUSCULAR | Status: AC | PRN
  Administered 2022-09-06: 85 mL via INTRAVENOUS

## 2022-09-06 NOTE — ED Notes (Signed)
Pt stated she still is unable to give an urine sample.

## 2022-09-06 NOTE — ED Provider Notes (Signed)
[place] EMERGENCY DEPARTMENT AT [facility] Provider Note   CSN: [id number] Arrival date & time: 09/06/22  1529     History  Chief Complaint  Patient presents with   Abdominal Pain    Mallory Klein is a 77 y.o. female.  The history is provided by the patient, medical records and the spouse. No language interpreter was used.  Abdominal Pain Pain location:  Generalized Pain quality: aching, bloating, cramping and dull   Pain radiates to:  Does not radiate Pain severity:  Severe Onset quality:  Gradual Duration:  3 weeks Timing:  Constant Progression:  Waxing and waning Chronicity:  New Context: not trauma   Worsened by:  Eating Ineffective treatments:  None tried Associated symptoms: anorexia and constipation   Associated symptoms: no chest pain, no chills, no cough, no diarrhea, no dysuria, no fatigue, no fever, no melena, no nausea, no shortness of breath, no vaginal bleeding, no vaginal discharge and no vomiting        Home Medications Prior to Admission medications   Medication Sig Start Date End Date Taking? Authorizing Provider  cetirizine (ZYRTEC) 10 MG tablet Take 1 tablet (10 mg total) by mouth daily. 11/05/21   Plotnikov, Georgina Quint, MD  Cholecalciferol (VITAMIN D3) 50 MCG (2000 UT) capsule TAKE 1 CAPSULE BY MOUTH EVERY DAY 09/09/21   Plotnikov, Georgina Quint, MD  [employer] SENNA PLUS 8.6-50 MG tablet Take 1 tablet by mouth 2 (two) times daily. 09/26/20   [provider]  cyanocobalamin (VITAMIN B12) 1000 MCG/ML injection INJECT 1 ML INTO THE SKIN EVERY 14 DAYS 10/20/21   Corwin Levins, MD  diclofenac Sodium (VOLTAREN) 1 % GEL Apply 4 g topically daily as needed (pain). 04/25/13   [provider]  docusate sodium (COLACE) 100 MG capsule Take 100 mg by mouth at bedtime. 06/26/19   [provider]  hydrOXYzine (VISTARIL) 50 MG capsule Take 1-2 capsules (50-100 mg total) by mouth every 6 (six) hours as needed for anxiety. 10/03/21   Corwin Levins, MD  pantoprazole (PROTONIX) 40 MG tablet Take 1 tablet (40 mg total) by mouth daily. 10/03/21   Corwin Levins, MD  polyethylene glycol (MIRALAX / GLYCOLAX) 17 g packet Take 17 g by mouth daily as needed for mild constipation.    [provider]  Pramipexole Dihydrochloride 0.75 MG TB24 Take 1 tablet (0.75 mg total) by mouth in the morning and at bedtime. 06/25/22   [name], [name], NP  QUEtiapine (SEROQUEL) 100 MG tablet Take 1 tablet (100 mg total) by mouth at bedtime. 10/03/21   Corwin Levins, MD  rosuvastatin (CRESTOR) 10 MG tablet Take 1 tablet (10 mg total) by mouth daily. 10/03/21 09/28/22  Corwin Levins, MD  solifenacin (VESICARE) 5 MG tablet Take 1 tablet (5 mg total) by mouth daily. Patient not taking: Reported on 03/24/2022 10/03/21   Corwin Levins, MD  donepezil (ARICEPT) 5 MG tablet Take 1 tablet (5 mg total) by mouth at bedtime. Patient not taking: No sig reported 11/27/19 07/23/20  Plotnikov, Georgina Quint, MD  venlafaxine XR (EFFEXOR-XR) 150 MG 24 hr capsule TAKE 1 CAPSULE BY MOUTH  EVERY DAY WITH BREAKFAST Patient not taking: Reported on 07/21/2020 05/29/19 07/23/20  Plotnikov, Georgina Quint, MD      Allergies    Ace inhibitors, Benicar [olmesartan], Aspirin, and Citalopram hydrobromide    Review of Systems   Review of Systems  Constitutional:  Negative for chills, fatigue and fever.  Respiratory:  Negative  for cough, chest tightness and shortness of breath.   Cardiovascular:  Negative for chest pain.  Gastrointestinal:  Positive for abdominal pain, anorexia and constipation. Negative for blood in stool, diarrhea, melena, nausea and vomiting.  Genitourinary:  Negative for dysuria, flank pain, frequency, vaginal bleeding and vaginal discharge.  Musculoskeletal:  Negative for back pain, neck pain and neck stiffness.  Skin:  Negative for rash and wound.  Neurological:  Negative for headaches.  Psychiatric/Behavioral:  Negative for agitation.   All other systems reviewed and are  negative.   Physical Exam Updated Vital Signs BP 131/85 (BP Location: Left Arm)   Pulse 94   Temp 98.3 F (36.8 C) (Temporal)   Resp 15   Ht 5\' 1"  (1.549 m)   Wt 80.7 kg   SpO2 97%   BMI 33.63 kg/m  Physical Exam Vitals and nursing note reviewed.  Constitutional:      General: She is not in acute distress.    Appearance: She is well-developed. She is not ill-appearing, toxic-appearing or diaphoretic.  HENT:     Head: Normocephalic and atraumatic.  Eyes:     Conjunctiva/sclera: Conjunctivae normal.  Cardiovascular:     Rate and Rhythm: Normal rate and regular rhythm.     Heart sounds: No murmur heard. Pulmonary:     Effort: Pulmonary effort is normal. No respiratory distress.     Breath sounds: Normal breath sounds. No wheezing, rhonchi or rales.  Chest:     Chest wall: No tenderness.  Abdominal:     General: Abdomen is flat. Bowel sounds are normal. There is no distension.     Palpations: Abdomen is soft.     Tenderness: There is abdominal tenderness. There is no right CVA tenderness, left CVA tenderness, guarding or rebound.  Musculoskeletal:        General: No swelling.     Cervical back: Neck supple.  Skin:    General: Skin is warm and dry.     Capillary Refill: Capillary refill takes less than 2 seconds.  Neurological:     General: No focal deficit present.     Mental Status: She is alert.  Psychiatric:        Mood and Affect: Mood normal.     ED Results / Procedures / Treatments   Labs (all labs ordered are listed, but only abnormal results are displayed) Labs Reviewed  LIPASE, BLOOD - Abnormal; Notable for the following components:      Result Value   Lipase <10 (*)    All other components within normal limits  COMPREHENSIVE METABOLIC PANEL - Abnormal; Notable for the following components:   Glucose, Bld 114 (*)    Creatinine, Ser 1.31 (*)    GFR, Estimated 42 (*)    All other components within normal limits  CBC - Abnormal; Notable for the  following components:   WBC 10.8 (*)    Platelets 404 (*)    All other components within normal limits  URINALYSIS, ROUTINE W REFLEX MICROSCOPIC - Abnormal; Notable for the following components:   Specific Gravity, Urine >1.046 (*)    Hgb urine dipstick TRACE (*)    Protein, ur 30 (*)    All other components within normal limits  LACTIC ACID, PLASMA  LACTIC ACID, PLASMA    EKG None  Radiology CT ABDOMEN PELVIS W CONTRAST  Result Date: 09/06/2022 CLINICAL DATA:  Abdominal pain and bloating EXAM: CT ABDOMEN AND PELVIS WITH CONTRAST TECHNIQUE: Multidetector CT imaging of the abdomen and pelvis  was performed using the standard protocol following bolus administration of intravenous contrast. RADIATION DOSE REDUCTION: This exam was performed according to the departmental dose-optimization program which includes automated exposure control, adjustment of the mA and/or kV according to patient size and/or use of iterative reconstruction technique. CONTRAST:  85mL OMNIPAQUE IOHEXOL 300 MG/ML  SOLN COMPARISON:  02/28/2018 FINDINGS: Lower chest: Heart is enlarged in size. Visualized lower lung fields are clear. Left hemidiaphragm is elevated. Minimal pericardial effusion is seen. Hepatobiliary: No focal abnormalities are seen and liver. There is no dilation of bile ducts. Gallbladder is unremarkable. Pancreas: There is dilation of pancreatic duct. No focal abnormalities are seen. Spleen: Spleen appears smaller than usual in size. Adrenals/Urinary Tract: Adrenals are unremarkable. There is no hydronephrosis. There are no renal or ureteral stones. There are few smooth marginated low-density lesions in the kidneys measuring up to 1.4 cm in diameter suggesting possible cysts. Stomach/Bowel: Stomach is unremarkable. There is no significant small bowel dilation. Appendix is difficult to visualize. There is no pericecal inflammation. There is no significant wall thickening in colon. There is no pericolic stranding.  Vascular/Lymphatic: There is ectasia of the upper abdominal aorta measuring 3.5 cm. Scattered arterial calcifications are seen in aorta and its major branches. Reproductive: Uterus is not seen. Other: There is no ascites or pneumoperitoneum. Musculoskeletal: Degenerative changes are noted in lumbar spine and lower thoracic spine with disc space narrowing, bony spurs and encroachment of neural foramina at multiple levels. IMPRESSION: There is no evidence of intestinal obstruction or pneumoperitoneum. There is no hydronephrosis. Cardiomegaly. Minimal pericardial effusion. Prominence of pancreatic duct has not changed significantly. Renal cysts. Aortic arteriosclerosis. Lumbar spondylosis. Electronically Signed   By: Ernie Avena M.D.   On: 09/06/2022 18:33    Procedures Procedures    Medications Ordered in ED Medications  fentaNYL (SUBLIMAZE) injection 50 mcg (50 mcg Intravenous Given 09/06/22 1641)  iohexol (OMNIPAQUE) 300 MG/ML solution 100 mL (85 mLs Intravenous Contrast Given 09/06/22 1731)  fentaNYL (SUBLIMAZE) injection 50 mcg (50 mcg Intravenous Given 09/06/22 2001)    ED Course/ Medical Decision Making/ A&P                             Medical Decision Making Amount and/or Complexity of Data Reviewed Labs: ordered. Radiology: ordered.  Risk Prescription drug management.    Mallory Klein is a 77 y.o. female with a past medical history significant for asthma, GERD, previous vertigo, Parkinson's disease, previous hysterectomy, sleep apnea, esophageal stricture, and previous diverticulosis who presents with abdominal pain.  According to patient and spouse, for the last several weeks, patient has had worsening abdominal pain.  It is moderate to severe at all times.  She reports she was having some constipation and has not had a bowel movement several days.  She does think she is still passing some gas but might be slightly different.  She reports she is tried mag citrate with some  bowel movements and her bowel movement several days ago was fairly normal.  She reports no urinary changes but is reporting moderate to severe pain all over her abdomen.  It wakes her up at night and does seem to be worsened when she eats or drinks.  She denies any trauma.  Denies rashes.  Denies any significant nausea or vomiting.  She is unsure if her abdomen is distended but she does feel bloated at times.  Family is concerned about obstruction of some kind.  She  denies any blood in her stool and denies any new medications.  Denies any trauma.  On exam, lungs clear.  Chest nontender.  Abdomen is diffusely tender.  Bowel sounds were appreciated.  Back and flanks nontender.  Patient moving all extremities.  Legs were nontender nonedematous and she did have distal pulses on my exam.  Rest of the exam remarkable only for a tremor which she reports is due to her Parkinson's and is not different than normal.  Given the patient's amount of abdominal discomfort I am somewhat concerned that she could have some other concerning problem as opposed to just constipation.  Will get CT scan and labs and urine.  Will give some pain medicine.  Anticipate reassessment after workup to determine disposition.  Patient's workup returned overall reassuring.  Creatinine has improved from prior.  Mild leukocytosis but otherwise labs did not show concerning findings.  Lactic acid nonelevated.  Urinalysis did not show UTI.  CT scan did not show acute diverticulitis, appendicitis, abscess, or obstruction.  No large stool impaction.  Suspect degree of constipation contributing to symptoms.  Patient reports she has appointment with her GI team tomorrow so she will go to that appointment with the new information workup results from today to discuss.  We discussed adding MiraLAX and increasing hydration.  She agrees with plan of care and is feeling much better.  Patient discharged in good condition with reassuring vital  signs.         Final Clinical Impression(s) / ED Diagnoses Final diagnoses:  Generalized abdominal pain  Constipation, unspecified constipation type    Rx / DC Orders ED Discharge Orders          Ordered    polyethylene glycol (MIRALAX / GLYCOLAX) 17 g packet  Daily        09/06/22 2110            Clinical Impression: 1. Generalized abdominal pain   2. Constipation, unspecified constipation type     Disposition: Discharge  Condition: Good  I have discussed the results, Dx and Tx plan with the pt(& family if present). He/she/they expressed understanding and agree(s) with the plan. Discharge instructions discussed at great length. Strict return precautions discussed and pt &/or family have verbalized understanding of the instructions. No further questions at time of discharge.    New Prescriptions   POLYETHYLENE GLYCOL (MIRALAX / GLYCOLAX) 17 G PACKET    Take 17 g by mouth daily.    Follow Up: Plotnikov, Georgina Quint, MD [address] [place] [address] [phone]     and go to your GI appointment tomorrow        Candid Bovey, Canary Brim, MD 09/06/22 2112

## 2022-09-06 NOTE — Discharge Instructions (Signed)
Your history, exam, and evaluation today was overall reassuring.  The CT scan did not show all the concerning findings we were worried about such as obstruction, diverticulitis, abscess, or other acute abnormality.  Your labs were reassuring as we discussed overall.  As your symptoms did improve, we feel you are safe for discharge home to go to your appointment tomorrow.  Please start taking MiraLAX daily and increase her hydration.  If any symptoms change or worsen acutely, please return to the nearest emergency department.

## 2022-09-06 NOTE — ED Triage Notes (Signed)
Pt arrives to ED with c/o worsening intermittent abdominal pains x3-4 weeks. Associated with constipation.

## 2022-09-07 ENCOUNTER — Ambulatory Visit: Payer: Medicare Other | Admitting: Internal Medicine

## 2022-09-07 ENCOUNTER — Encounter: Payer: Self-pay | Admitting: Internal Medicine

## 2022-09-07 VITALS — BP 120/60 | HR 94 | Temp 98.6°F | Ht 61.0 in

## 2022-09-07 DIAGNOSIS — G20A1 Parkinson's disease without dyskinesia, without mention of fluctuations: Secondary | ICD-10-CM

## 2022-09-07 DIAGNOSIS — K5904 Chronic idiopathic constipation: Secondary | ICD-10-CM | POA: Diagnosis not present

## 2022-09-07 DIAGNOSIS — E538 Deficiency of other specified B group vitamins: Secondary | ICD-10-CM

## 2022-09-07 DIAGNOSIS — N183 Chronic kidney disease, stage 3 unspecified: Secondary | ICD-10-CM

## 2022-09-07 DIAGNOSIS — I10 Essential (primary) hypertension: Secondary | ICD-10-CM | POA: Diagnosis not present

## 2022-09-07 DIAGNOSIS — K5900 Constipation, unspecified: Secondary | ICD-10-CM | POA: Insufficient documentation

## 2022-09-07 NOTE — Assessment & Plan Note (Signed)
On B12 inj q 2 wks ?

## 2022-09-07 NOTE — Patient Instructions (Addendum)
Daily: Dissolve Miralax 1 scoop in water  Take Senakot S  If no stool for a few days: Dissolve Miralax 4-6 scoops in a bottle of water - drink over 2-4 hrs Take 2 Dulcolax tablets twice a day Use Fleet suppository and/or Fleet enema if no result

## 2022-09-07 NOTE — Progress Notes (Addendum)
Subjective:  Patient ID: Mallory Klein, female    DOB: [birth year]  Age: 77 y.o. MRN: [id number]  CC: Constipation   HPI Mallory Klein presents for post-ER visit. No BM for up to 2-3 wks, except for small BM. She had abd pain. Abd CT was (-).  Outpatient Medications Prior to Visit  Medication Sig Dispense Refill   cetirizine (ZYRTEC) 10 MG tablet Take 1 tablet (10 mg total) by mouth daily. 90 tablet 3   Cholecalciferol (VITAMIN D3) 50 MCG (2000 UT) capsule TAKE 1 CAPSULE BY MOUTH EVERY DAY 100 capsule 3   [employer] SENNA PLUS 8.6-50 MG tablet Take 1 tablet by mouth 2 (two) times daily.     cyanocobalamin (VITAMIN B12) 1000 MCG/ML injection INJECT 1 ML INTO THE SKIN EVERY 14 DAYS 2 mL 19   diclofenac Sodium (VOLTAREN) 1 % GEL Apply 4 g topically daily as needed (pain).     docusate sodium (COLACE) 100 MG capsule Take 100 mg by mouth at bedtime.     hydrOXYzine (VISTARIL) 50 MG capsule Take 1-2 capsules (50-100 mg total) by mouth every 6 (six) hours as needed for anxiety. 60 capsule 5   pantoprazole (PROTONIX) 40 MG tablet Take 1 tablet (40 mg total) by mouth daily. 90 tablet 3   polyethylene glycol (MIRALAX / GLYCOLAX) 17 g packet Take 17 g by mouth daily as needed for mild constipation.     Pramipexole Dihydrochloride 0.75 MG TB24 Take 1 tablet (0.75 mg total) by mouth in the morning and at bedtime. 180 tablet 1   QUEtiapine (SEROQUEL) 100 MG tablet Take 1 tablet (100 mg total) by mouth at bedtime. 90 tablet 3   rosuvastatin (CRESTOR) 10 MG tablet Take 1 tablet (10 mg total) by mouth daily. 90 tablet 3   solifenacin (VESICARE) 5 MG tablet Take 1 tablet (5 mg total) by mouth daily. 90 tablet 3   polyethylene glycol (MIRALAX / GLYCOLAX) 17 g packet Take 17 g by mouth daily. 14 each 0   No facility-administered medications prior to visit.    ROS: Review of Systems  Constitutional:  Positive for fatigue. Negative for activity change, appetite change, chills and unexpected weight  change.  HENT:  Negative for congestion, mouth sores and sinus pressure.   Eyes:  Negative for visual disturbance.  Respiratory:  Negative for cough and chest tightness.   Gastrointestinal:  Positive for abdominal pain and constipation. Negative for nausea.  Genitourinary:  Negative for difficulty urinating, frequency and vaginal pain.  Musculoskeletal:  Negative for back pain and gait problem.  Skin:  Negative for pallor and rash.  Neurological:  Negative for dizziness, tremors, weakness, numbness and headaches.  Psychiatric/Behavioral:  Positive for decreased concentration and sleep disturbance. Negative for confusion and suicidal ideas. The patient is nervous/anxious.     Objective:  BP 120/60 (BP Location: Left Arm, Patient Position: Sitting, Cuff Size: Large)   Pulse 94   Temp 98.6 F (37 C) (Oral)   Ht 5\' 1"  (1.549 m)   SpO2 94%   BMI 33.63 kg/m   BP Readings from Last 3 Encounters:  09/07/22 120/60  09/06/22 124/62  06/25/22 122/78    Wt Readings from Last 3 Encounters:  09/06/22 178 lb (80.7 kg)  06/25/22 153 lb (69.4 kg)  06/24/22 164 lb (74.4 kg)    Physical Exam Constitutional:      General: She is not in acute distress.    Appearance: She is well-developed.  HENT:  Head: Normocephalic.     Right Ear: External ear normal.     Left Ear: External ear normal.     Nose: Nose normal.  Eyes:     General:        Right eye: No discharge.        Left eye: No discharge.     Conjunctiva/sclera: Conjunctivae normal.     Pupils: Pupils are equal, round, and reactive to light.  Neck:     Thyroid: No thyromegaly.     Vascular: No JVD.     Trachea: No tracheal deviation.  Cardiovascular:     Rate and Rhythm: Normal rate and regular rhythm.     Heart sounds: Normal heart sounds.  Pulmonary:     Effort: No respiratory distress.     Breath sounds: No stridor. No wheezing.  Abdominal:     General: Bowel sounds are normal. There is no distension.     Palpations:  Abdomen is soft. There is no mass.     Tenderness: There is no abdominal tenderness. There is no guarding or rebound.  Musculoskeletal:        General: No tenderness.     Cervical back: Normal range of motion and neck supple. No rigidity.  Lymphadenopathy:     Cervical: No cervical adenopathy.  Skin:    Findings: No erythema or rash.  Neurological:     Cranial Nerves: No cranial nerve deficit.     Motor: No abnormal muscle tone.     Coordination: Coordination normal.     Deep Tendon Reflexes: Reflexes normal.  Psychiatric:        Behavior: Behavior normal.        Thought Content: Thought content normal.        Judgment: Judgment normal.   Tremor. Hard to move. Slow. Needs a lift chair  Lab Results  Component Value Date   WBC 10.8 (H) 09/06/2022   HGB 12.8 09/06/2022   HCT 39.6 09/06/2022   PLT 404 (H) 09/06/2022   GLUCOSE 114 (H) 09/06/2022   CHOL 200 (H) 01/07/2021   TRIG 53 01/07/2021   HDL 79 01/07/2021   LDLDIRECT 109.8 02/10/2010   LDLCALC 111 (H) 01/07/2021   ALT 8 09/06/2022   AST 20 09/06/2022   NA 137 09/06/2022   K 4.5 09/06/2022   CL 98 09/06/2022   CREATININE 1.31 (H) 09/06/2022   BUN 17 09/06/2022   CO2 30 09/06/2022   TSH 0.55 06/25/2022   INR 0.97 02/07/2014   HGBA1C 6.1 06/25/2022    CT ABDOMEN PELVIS W CONTRAST  Result Date: 09/06/2022 CLINICAL DATA:  Abdominal pain and bloating EXAM: CT ABDOMEN AND PELVIS WITH CONTRAST TECHNIQUE: Multidetector CT imaging of the abdomen and pelvis was performed using the standard protocol following bolus administration of intravenous contrast. RADIATION DOSE REDUCTION: This exam was performed according to the departmental dose-optimization program which includes automated exposure control, adjustment of the mA and/or kV according to patient size and/or use of iterative reconstruction technique. CONTRAST:  85mL OMNIPAQUE IOHEXOL 300 MG/ML  SOLN COMPARISON:  02/28/2018 FINDINGS: Lower chest: Heart is enlarged in size.  Visualized lower lung fields are clear. Left hemidiaphragm is elevated. Minimal pericardial effusion is seen. Hepatobiliary: No focal abnormalities are seen and liver. There is no dilation of bile ducts. Gallbladder is unremarkable. Pancreas: There is dilation of pancreatic duct. No focal abnormalities are seen. Spleen: Spleen appears smaller than usual in size. Adrenals/Urinary Tract: Adrenals are unremarkable. There is no hydronephrosis. There are  no renal or ureteral stones. There are few smooth marginated low-density lesions in the kidneys measuring up to 1.4 cm in diameter suggesting possible cysts. Stomach/Bowel: Stomach is unremarkable. There is no significant small bowel dilation. Appendix is difficult to visualize. There is no pericecal inflammation. There is no significant wall thickening in colon. There is no pericolic stranding. Vascular/Lymphatic: There is ectasia of the upper abdominal aorta measuring 3.5 cm. Scattered arterial calcifications are seen in aorta and its major branches. Reproductive: Uterus is not seen. Other: There is no ascites or pneumoperitoneum. Musculoskeletal: Degenerative changes are noted in lumbar spine and lower thoracic spine with disc space narrowing, bony spurs and encroachment of neural foramina at multiple levels. IMPRESSION: There is no evidence of intestinal obstruction or pneumoperitoneum. There is no hydronephrosis. Cardiomegaly. Minimal pericardial effusion. Prominence of pancreatic duct has not changed significantly. Renal cysts. Aortic arteriosclerosis. Lumbar spondylosis. Electronically Signed   By: Ernie Avena M.D.   On: 09/06/2022 18:33    Assessment & Plan:   Problem List Items Addressed This Visit     Essential hypertension    BP Readings from Last 3 Encounters:  09/07/22 120/60  09/06/22 124/62  06/25/22 122/78         Parkinson disease (HCC)    On Mirapex, off Sinemet Using a walker Tremor. Hard to move. Slow. Needs a lift chair       B12 deficiency    On B12 inj q 2 wks      CRI (chronic renal insufficiency), stage 3 (moderate) (HCC)    Hydrate well      Constipation - Primary    Daily: Dissolve Miralax 1 scoop in water  Take Senakot S  If no stool for a few days: Dissolve Miralax 4-6 scoops in a bottle of water - drink over 2-4 hrs Take 2 Dulcolax tablets twice a day Use Fleet suppository and/or Fleet enema if no result         No orders of the defined types were placed in this encounter.     Follow-up: Return in about 3 months (around 12/08/2022) for a follow-up visit.  Sonda Primes, MD

## 2022-09-07 NOTE — Assessment & Plan Note (Signed)
Hydrate well 

## 2022-09-07 NOTE — Assessment & Plan Note (Signed)
Daily: Dissolve Miralax 1 scoop in water  Take Senakot S  If no stool for a few days: Dissolve Miralax 4-6 scoops in a bottle of water - drink over 2-4 hrs Take 2 Dulcolax tablets twice a day Use Fleet suppository and/or Fleet enema if no result

## 2022-09-07 NOTE — Assessment & Plan Note (Signed)
BP Readings from Last 3 Encounters:  09/07/22 120/60  09/06/22 124/62  06/25/22 122/78

## 2022-09-07 NOTE — Assessment & Plan Note (Addendum)
On Mirapex, off Sinemet Using a walker Tremor. Hard to move. Slow. Needs a lift chair

## 2022-09-09 NOTE — Telephone Encounter (Signed)
Patient called back and said that [employer] does not do the power lift chair - the order needs to be sent to [employer] - Fax:  [phone]

## 2022-09-09 NOTE — Telephone Encounter (Signed)
Faxed order for Lift Chair to [employer] - Fax: [phone].Marland KitchenRaechel Chute

## 2022-09-15 ENCOUNTER — Emergency Department (HOSPITAL_BASED_OUTPATIENT_CLINIC_OR_DEPARTMENT_OTHER)
Admission: EM | Admit: 2022-09-15 | Discharge: 2022-09-15 | Disposition: A | Discharge status: Home / Self Care | Payer: Medicare Other | Attending: Emergency Medicine | Admitting: Emergency Medicine

## 2022-09-15 ENCOUNTER — Other Ambulatory Visit: Payer: Self-pay

## 2022-09-15 ENCOUNTER — Encounter (HOSPITAL_BASED_OUTPATIENT_CLINIC_OR_DEPARTMENT_OTHER): Payer: Self-pay | Admitting: Emergency Medicine

## 2022-09-15 ENCOUNTER — Emergency Department (HOSPITAL_BASED_OUTPATIENT_CLINIC_OR_DEPARTMENT_OTHER): Payer: Medicare Other

## 2022-09-15 DIAGNOSIS — R110 Nausea: Secondary | ICD-10-CM | POA: Diagnosis not present

## 2022-09-15 DIAGNOSIS — R102 Pelvic and perineal pain: Secondary | ICD-10-CM

## 2022-09-15 DIAGNOSIS — K3189 Other diseases of stomach and duodenum: Secondary | ICD-10-CM | POA: Diagnosis not present

## 2022-09-15 DIAGNOSIS — D72829 Elevated white blood cell count, unspecified: Secondary | ICD-10-CM | POA: Insufficient documentation

## 2022-09-15 DIAGNOSIS — R161 Splenomegaly, not elsewhere classified: Secondary | ICD-10-CM | POA: Diagnosis not present

## 2022-09-15 DIAGNOSIS — R1030 Lower abdominal pain, unspecified: Secondary | ICD-10-CM | POA: Insufficient documentation

## 2022-09-15 DIAGNOSIS — R630 Anorexia: Secondary | ICD-10-CM | POA: Insufficient documentation

## 2022-09-15 DIAGNOSIS — G20C Parkinsonism, unspecified: Secondary | ICD-10-CM | POA: Diagnosis not present

## 2022-09-15 LAB — URINALYSIS, ROUTINE W REFLEX MICROSCOPIC
Bacteria, UA: NONE SEEN
Bilirubin Urine: NEGATIVE
Glucose, UA: NEGATIVE mg/dL
Hgb urine dipstick: NEGATIVE
Ketones, ur: 15 mg/dL — AB
Leukocytes,Ua: NEGATIVE
Nitrite: NEGATIVE
Protein, ur: 30 mg/dL — AB
Specific Gravity, Urine: >1.046 — ABNORMAL HIGH (ref 1.005–1.030)
pH: 6.5 (ref 5.0–8.0)

## 2022-09-15 LAB — CBC
HCT: 40.9 % (ref 36.0–46.0)
Hemoglobin: 12.9 g/dL (ref 12.0–15.0)
MCH: 28.2 pg (ref 26.0–34.0)
MCHC: 31.5 g/dL (ref 30.0–36.0)
MCV: 89.5 fL (ref 80.0–100.0)
Platelets: 456 10*3/uL — ABNORMAL HIGH (ref 150–400)
RBC: 4.57 MIL/uL (ref 3.87–5.11)
RDW: 15.3 % (ref 11.5–15.5)
WBC: 14.1 10*3/uL — ABNORMAL HIGH (ref 4.0–10.5)
nRBC: 0 % (ref 0.0–0.2)

## 2022-09-15 LAB — COMPREHENSIVE METABOLIC PANEL
ALT: 7 U/L (ref 0–44)
AST: 18 U/L (ref 15–41)
Albumin: 4 g/dL (ref 3.5–5.0)
Alkaline Phosphatase: 57 U/L (ref 38–126)
Anion gap: 11 (ref 5–15)
BUN: 15 mg/dL (ref 8–23)
CO2: 29 mmol/L (ref 22–32)
Calcium: 9.8 mg/dL (ref 8.9–10.3)
Chloride: 96 mmol/L — ABNORMAL LOW (ref 98–111)
Creatinine, Ser: 1.22 mg/dL — ABNORMAL HIGH (ref 0.44–1.00)
GFR, Estimated: 46 mL/min — ABNORMAL LOW (ref >60)
Glucose, Bld: 95 mg/dL (ref 70–99)
Potassium: 4.4 mmol/L (ref 3.5–5.1)
Sodium: 136 mmol/L (ref 135–145)
Total Bilirubin: 0.8 mg/dL (ref 0.3–1.2)
Total Protein: 8.3 g/dL — ABNORMAL HIGH (ref 6.5–8.1)

## 2022-09-15 LAB — LIPASE, BLOOD: Lipase: <10 U/L — ABNORMAL LOW (ref 11–51)

## 2022-09-15 MED ORDER — IOHEXOL 300 MG/ML  SOLN
100.0000 mL | Freq: Once | INTRAMUSCULAR | Status: AC | PRN
  Administered 2022-09-15: 70 mL via INTRAVENOUS

## 2022-09-15 MED ORDER — MORPHINE SULFATE (PF) 2 MG/ML IV SOLN
2.0000 mg | Freq: Once | INTRAVENOUS | Status: AC
  Administered 2022-09-15: 2 mg via INTRAVENOUS
  Filled 2022-09-15: qty 1

## 2022-09-15 MED ORDER — PANTOPRAZOLE SODIUM 40 MG PO TBEC: 40.0000 mg | DELAYED_RELEASE_TABLET | Freq: Two times a day (BID) | ORAL | 0 refills | Status: DC

## 2022-09-15 NOTE — Discharge Instructions (Addendum)
On your CAT scan today, it showed some thickening of your stomach, it could represent gastritis, or acid reflux, but it may represent represent a gastric tumor.  Please follow-up with GI for an EGD.  Additionally it is hard to say what is causing your pelvic pain/suprapubic pain, and may be bladder spasms versus a distended bladder, please follow-up with your primary care doctor in regards to this.

## 2022-09-15 NOTE — ED Provider Notes (Signed)
[place] [facility]PARTMENT AT [facility] Provider Note   CSN: [id number] Arrival date & time: 09/15/22  1525     History  Chief Complaint  Patient presents with   Abdominal Pain    Mallory Klein is a 77 y.o. female, history of Parkinson's, constipation, who presents to the ED secondary to lower pelvic pain that is been going on for the last month.  She states that it is achy in nature, and that was initially intermittent, but now is constant.  Does not have any nausea, vomiting.  Last bowel movement was this morning.  She states the pain does not go away now, and she is having a difficult time sleeping due to it.  Does have a history of a hysterectomy, but denies any kind of vaginal discharge, or vaginal bleeding.  Notes that she is having some loose stools.  Denies any urinary symptoms including frequency, urgency.  Reports reduced appetite secondary to the nausea.     Home Medications Prior to Admission medications   Medication Sig Start Date End Date Taking? Authorizing Provider  pantoprazole (PROTONIX) 40 MG tablet Take 1 tablet (40 mg total) by mouth 2 (two) times daily. 09/15/22  Yes ,  L, PA  cetirizine (ZYRTEC) 10 MG tablet Take 1 tablet (10 mg total) by mouth daily. 11/05/21   Plotnikov, Georgina Quint, MD  Cholecalciferol (VITAMIN D3) 50 MCG (2000 UT) capsule TAKE 1 CAPSULE BY MOUTH EVERY DAY 09/09/21   Plotnikov, Georgina Quint, MD  [employer] SENNA PLUS 8.6-50 MG tablet Take 1 tablet by mouth 2 (two) times daily. 09/26/20   [provider]  cyanocobalamin (VITAMIN B12) 1000 MCG/ML injection INJECT 1 ML INTO THE SKIN EVERY 14 DAYS 10/20/21   Corwin Levins, MD  diclofenac Sodium (VOLTAREN) 1 % GEL Apply 4 g topically daily as needed (pain). 04/25/13   [provider]  docusate sodium (COLACE) 100 MG capsule Take 100 mg by mouth at bedtime. 06/26/19   [provider]  hydrOXYzine (VISTARIL) 50 MG capsule Take 1-2 capsules (50-100 mg total) by mouth  every 6 (six) hours as needed for anxiety. 10/03/21   Corwin Levins, MD  polyethylene glycol (MIRALAX / GLYCOLAX) 17 g packet Take 17 g by mouth daily as needed for mild constipation.    [provider]  Pramipexole Dihydrochloride 0.75 MG TB24 Take 1 tablet (0.75 mg total) by mouth in the morning and at bedtime. 06/25/22   [name], [name], NP  QUEtiapine (SEROQUEL) 100 MG tablet Take 1 tablet (100 mg total) by mouth at bedtime. 10/03/21   Corwin Levins, MD  rosuvastatin (CRESTOR) 10 MG tablet Take 1 tablet (10 mg total) by mouth daily. 10/03/21 09/28/22  Corwin Levins, MD  solifenacin (VESICARE) 5 MG tablet Take 1 tablet (5 mg total) by mouth daily. 10/03/21   Corwin Levins, MD  donepezil (ARICEPT) 5 MG tablet Take 1 tablet (5 mg total) by mouth at bedtime. Patient not taking: No sig reported 11/27/19 07/23/20  Plotnikov, Georgina Quint, MD  venlafaxine XR (EFFEXOR-XR) 150 MG 24 hr capsule TAKE 1 CAPSULE BY MOUTH  EVERY DAY WITH BREAKFAST Patient not taking: Reported on 07/21/2020 05/29/19 07/23/20  Plotnikov, Georgina Quint, MD      Allergies    Ace inhibitors, Benicar [olmesartan], Aspirin, and Citalopram hydrobromide    Review of Systems   Review of Systems  Gastrointestinal:  Positive for abdominal pain and nausea. Negative for vomiting.    Physical Exam Updated Vital  Signs BP 124/82   Pulse 75   Temp 98.5 F (36.9 C)   Resp (!) 26   Ht 5\' 1"  (1.549 m)   Wt 77.1 kg   SpO2 96%   BMI 32.12 kg/m  Physical Exam Vitals and nursing note reviewed.  Constitutional:      General: She is not in acute distress.    Appearance: She is well-developed.  HENT:     Head: Normocephalic and atraumatic.  Eyes:     Conjunctiva/sclera: Conjunctivae normal.  Cardiovascular:     Rate and Rhythm: Normal rate and regular rhythm.     Heart sounds: No murmur heard. Pulmonary:     Effort: Pulmonary effort is normal. No respiratory distress.     Breath sounds: Normal breath sounds.  Abdominal:     Palpations:  Abdomen is soft.     Tenderness: There is abdominal tenderness in the suprapubic area.  Genitourinary:    Comments: Loose stool in rectum, no evidence of any kind of masses, or hard fecal matter Musculoskeletal:        General: No swelling.     Cervical back: Neck supple.  Skin:    General: Skin is warm and dry.     Capillary Refill: Capillary refill takes less than 2 seconds.  Neurological:     Mental Status: She is alert.  Psychiatric:        Mood and Affect: Mood normal.     ED Results / Procedures / Treatments   Labs (all labs ordered are listed, but only abnormal results are displayed) Labs Reviewed  LIPASE, BLOOD - Abnormal; Notable for the following components:      Result Value   Lipase <10 (*)    All other components within normal limits  COMPREHENSIVE METABOLIC PANEL - Abnormal; Notable for the following components:   Chloride 96 (*)    Creatinine, Ser 1.22 (*)    Total Protein 8.3 (*)    GFR, Estimated 46 (*)    All other components within normal limits  CBC - Abnormal; Notable for the following components:   WBC 14.1 (*)    Platelets 456 (*)    All other components within normal limits  URINALYSIS, ROUTINE W REFLEX MICROSCOPIC - Abnormal; Notable for the following components:   Specific Gravity, Urine >1.046 (*)    Ketones, ur 15 (*)    Protein, ur 30 (*)    All other components within normal limits    EKG None  Radiology CT ABDOMEN PELVIS W CONTRAST  Result Date: 09/15/2022 CLINICAL DATA:  Abdominal and pelvic pain for approximately 1 month. Diverticulosis. EXAM: CT ABDOMEN AND PELVIS WITH CONTRAST TECHNIQUE: Multidetector CT imaging of the abdomen and pelvis was performed using the standard protocol following bolus administration of intravenous contrast. RADIATION DOSE REDUCTION: This exam was performed according to the departmental dose-optimization program which includes automated exposure control, adjustment of the mA and/or kV according to patient  size and/or use of iterative reconstruction technique. CONTRAST:  70mL OMNIPAQUE IOHEXOL 300 MG/ML  SOLN COMPARISON:  09/06/2022 FINDINGS: Lower Chest: No acute findings.  Stable cardiomegaly. Hepatobiliary: No suspicious hepatic masses identified. Gallbladder is unremarkable. No evidence of biliary ductal dilatation. Pancreas:  No mass or inflammatory changes. Spleen: Prior splenomegaly with stable  accessory splenules. Adrenals/Urinary Tract: No suspicious masses identified. No evidence of ureteral calculi or hydronephrosis. Stomach/Bowel: Mild wall thickening is seen involving the distal gastric body and antrum, suspicious for gastritis, or less likely gastric neoplasm. No evidence of  free intraperitoneal air. No evidence of obstruction, inflammatory process or abnormal fluid collections. Vascular/Lymphatic: No pathologically enlarged lymph nodes. No acute vascular findings. Aortic atherosclerotic calcification incidentally noted. Reproductive:  No mass or other significant abnormality. Other:  None. Musculoskeletal:  No suspicious bone lesions identified. IMPRESSION: Mild wall thickening involving the distal gastric body and antrum suspicious for gastritis, or less likely gastric neoplasm. Consider upper endoscopy for further evaluation. No other acute findings. Aortic Atherosclerosis (ICD10-I70.0). Electronically Signed   By: Danae Orleans M.D.   On: 09/15/2022 19:01    Procedures Procedures    Medications Ordered in ED Medications  morphine (PF) 2 MG/ML injection 2 mg (2 mg Intravenous Given 09/15/22 1706)  iohexol (OMNIPAQUE) 300 MG/ML solution 100 mL (70 mLs Intravenous Contrast Given 09/15/22 1824)    ED Course/ Medical Decision Making/ A&P                             Medical Decision Making Patient is a 77 year old female, here for suprapubic pain that is been going on for the last month.  She states that sometimes it causes her to wake up from sleep.  She notes it is pressurized.   Obtain a CT abdomen pelvis, and blood work for further evaluation.  She does not have any urinary symptoms.  Denies any recent antibiotic use.  Amount and/or Complexity of Data Reviewed Labs: ordered.    Details: Leukocytosis of 14,000 Radiology: ordered.    Details: CT abdomen pelvis, shows no acute findings except for mild wall thickening of the stomach, or distal gastric body Discussion of management or test interpretation with external provider(s): Discussed with patient, and there is no findings to support her suprapubic pain, there is seems to be no inflammation of the bowel, as she does have inflammation of her gastric region, and I will start her on PPI 40 mg twice a day and refer her to GI.  Additionally we discussed that she is possibly having some bladder spasms, to cause this pain, and should follow-up with her primary care doctor.  We discussed return precautions and they voiced understanding.  Risk Prescription drug management.    Final Clinical Impression(s) / ED Diagnoses Final diagnoses:  Suprapubic pain    Rx / DC Orders ED Discharge Orders          Ordered    pantoprazole (PROTONIX) 40 MG tablet  2 times daily        09/15/22 2133              ,  L, PA 09/15/22 2135    Edwin Dada P, DO 09/25/22 1338

## 2022-09-15 NOTE — ED Notes (Signed)
Reviewed AVS/discharge instruction with patient. Time allotted for and all questions answered. Patient is agreeable for d/c and escorted to ed exit by staff.  

## 2022-09-15 NOTE — ED Triage Notes (Signed)
Pt arrives to ED with c/o on-going abdominal pain x1 month.

## 2022-09-16 ENCOUNTER — Other Ambulatory Visit: Payer: Self-pay | Admitting: Internal Medicine

## 2022-09-16 ENCOUNTER — Telehealth: Payer: Self-pay | Admitting: Internal Medicine

## 2022-09-16 NOTE — Telephone Encounter (Signed)
Pharmacy requested we send in a 90 day supply of pantoprazole (PROTONIX) 40 MG tablet, given by [facility] ED, so that the pt may continue the RX based on the ED recommendation.   [facility]  Phone: [phone]  Fax: [phone]

## 2022-09-17 ENCOUNTER — Ambulatory Visit (INDEPENDENT_AMBULATORY_CARE_PROVIDER_SITE_OTHER): Payer: Medicare Other | Admitting: Internal Medicine

## 2022-09-17 ENCOUNTER — Encounter: Payer: Self-pay | Admitting: Internal Medicine

## 2022-09-17 VITALS — BP 110/80 | HR 78 | Temp 98.6°F | Ht 61.0 in

## 2022-09-17 DIAGNOSIS — K5904 Chronic idiopathic constipation: Secondary | ICD-10-CM

## 2022-09-17 DIAGNOSIS — N183 Chronic kidney disease, stage 3 unspecified: Secondary | ICD-10-CM | POA: Diagnosis not present

## 2022-09-17 DIAGNOSIS — R1084 Generalized abdominal pain: Secondary | ICD-10-CM

## 2022-09-17 DIAGNOSIS — K2970 Gastritis, unspecified, without bleeding: Secondary | ICD-10-CM | POA: Diagnosis not present

## 2022-09-17 DIAGNOSIS — R109 Unspecified abdominal pain: Secondary | ICD-10-CM | POA: Insufficient documentation

## 2022-09-17 MED ORDER — PANTOPRAZOLE SODIUM 40 MG PO TBEC: 40.0000 mg | DELAYED_RELEASE_TABLET | Freq: Two times a day (BID) | ORAL | 3 refills | Status: DC

## 2022-09-17 NOTE — Telephone Encounter (Signed)
It has been done. I sent the prescription again just in case.  Thank you

## 2022-09-17 NOTE — Progress Notes (Signed)
Subjective:  Patient ID: Mallory Klein, female    DOB: 12/25/1945  Age: 77 y.o. MRN: [id number]  CC: Diarrhea (Abdominal cramping, unable to eat or drink due to having to use the restroom right after causing watery stool.)   HPI Mallory Klein presents for post ER visit 2 d ago for lower abd pain. Pt had a CT of the abdomen. Mallory Klein is a difficult historian. Doc is helping.  Outpatient Medications Prior to Visit  Medication Sig Dispense Refill   cetirizine (ZYRTEC) 10 MG tablet Take 1 tablet (10 mg total) by mouth daily. 90 tablet 3   Cholecalciferol (VITAMIN D3) 50 MCG (2000 UT) capsule TAKE 1 CAPSULE BY MOUTH EVERY DAY 100 capsule 3   [employer] SENNA PLUS 8.6-50 MG tablet Take 1 tablet by mouth 2 (two) times daily.     cyanocobalamin (VITAMIN B12) 1000 MCG/ML injection INJECT 1 ML INTO THE SKIN EVERY 14 DAYS 2 mL 19   diclofenac Sodium (VOLTAREN) 1 % GEL Apply 4 g topically daily as needed (pain).     docusate sodium (COLACE) 100 MG capsule Take 100 mg by mouth at bedtime.     hydrOXYzine (VISTARIL) 50 MG capsule Take 1-2 capsules (50-100 mg total) by mouth every 6 (six) hours as needed for anxiety. 60 capsule 5   pantoprazole (PROTONIX) 40 MG tablet Take 1 tablet (40 mg total) by mouth 2 (two) times daily. 90 tablet 3   polyethylene glycol (MIRALAX / GLYCOLAX) 17 g packet Take 17 g by mouth daily as needed for mild constipation.     Pramipexole Dihydrochloride 0.75 MG TB24 Take 1 tablet (0.75 mg total) by mouth in the morning and at bedtime. 180 tablet 1   QUEtiapine (SEROQUEL) 100 MG tablet TAKE ONE TABLET BY MOUTH AT BEDTIME 90 tablet 1   rosuvastatin (CRESTOR) 10 MG tablet TAKE ONE TABLET BY MOUTH EVERY DAY 90 tablet 1   solifenacin (VESICARE) 5 MG tablet TAKE ONE TABLET BY MOUTH EVERY DAY 90 tablet 1   No facility-administered medications prior to visit.    ROS: Review of Systems  Constitutional:  Positive for fatigue. Negative for activity change, appetite change, chills and  unexpected weight change.  HENT:  Negative for congestion, mouth sores and sinus pressure.   Eyes:  Negative for visual disturbance.  Respiratory:  Negative for cough and chest tightness.   Gastrointestinal:  Positive for constipation. Negative for abdominal pain and nausea.  Genitourinary:  Negative for difficulty urinating, frequency and vaginal pain.  Musculoskeletal:  Negative for back pain and gait problem.  Skin:  Negative for pallor and rash.  Neurological:  Negative for dizziness, tremors, weakness, numbness and headaches.  Psychiatric/Behavioral:  Negative for confusion and sleep disturbance. The patient is nervous/anxious.     Objective:  BP 110/80 (BP Location: Right Arm, Patient Position: Sitting, Cuff Size: Normal)   Pulse 78   Temp 98.6 F (37 C) (Oral)   Ht 5\' 1"  (1.549 m)   SpO2 97%   BMI 32.12 kg/m   BP Readings from Last 3 Encounters:  09/17/22 110/80  09/15/22 124/82  09/07/22 120/60    Wt Readings from Last 3 Encounters:  09/15/22 170 lb (77.1 kg)  09/06/22 178 lb (80.7 kg)  06/25/22 153 lb (69.4 kg)    Physical Exam Constitutional:      General: She is not in acute distress.    Appearance: She is well-developed. She is obese.  HENT:     Head: Normocephalic.  Right Ear: External ear normal.     Left Ear: External ear normal.     Nose: Nose normal.  Eyes:     General:        Right eye: No discharge.        Left eye: No discharge.     Conjunctiva/sclera: Conjunctivae normal.     Pupils: Pupils are equal, round, and reactive to light.  Neck:     Thyroid: No thyromegaly.     Vascular: No JVD.     Trachea: No tracheal deviation.  Cardiovascular:     Rate and Rhythm: Normal rate and regular rhythm.     Heart sounds: Normal heart sounds.  Pulmonary:     Effort: No respiratory distress.     Breath sounds: No stridor. No wheezing.  Abdominal:     General: Bowel sounds are normal. There is no distension.     Palpations: Abdomen is soft. There  is no mass.     Tenderness: There is no abdominal tenderness. There is no guarding or rebound.  Musculoskeletal:        General: No tenderness.     Cervical back: Normal range of motion and neck supple. No rigidity.  Lymphadenopathy:     Cervical: No cervical adenopathy.  Skin:    Findings: No erythema or rash.  Neurological:     Cranial Nerves: No cranial nerve deficit.     Motor: No abnormal muscle tone.     Coordination: Coordination normal.     Deep Tendon Reflexes: Reflexes normal.  Psychiatric:        Behavior: Behavior normal.        Thought Content: Thought content normal.        Judgment: Judgment normal.     Lab Results  Component Value Date   WBC 14.1 (H) 09/15/2022   HGB 12.9 09/15/2022   HCT 40.9 09/15/2022   PLT 456 (H) 09/15/2022   GLUCOSE 95 09/15/2022   CHOL 200 (H) 01/07/2021   TRIG 53 01/07/2021   HDL 79 01/07/2021   LDLDIRECT 109.8 02/10/2010   LDLCALC 111 (H) 01/07/2021   ALT 7 09/15/2022   AST 18 09/15/2022   NA 136 09/15/2022   K 4.4 09/15/2022   CL 96 (L) 09/15/2022   CREATININE 1.22 (H) 09/15/2022   BUN 15 09/15/2022   CO2 29 09/15/2022   TSH 0.55 06/25/2022   INR 0.97 02/07/2014   HGBA1C 6.1 06/25/2022    CT ABDOMEN PELVIS W CONTRAST  Result Date: 09/15/2022 CLINICAL DATA:  Abdominal and pelvic pain for approximately 1 month. Diverticulosis. EXAM: CT ABDOMEN AND PELVIS WITH CONTRAST TECHNIQUE: Multidetector CT imaging of the abdomen and pelvis was performed using the standard protocol following bolus administration of intravenous contrast. RADIATION DOSE REDUCTION: This exam was performed according to the departmental dose-optimization program which includes automated exposure control, adjustment of the mA and/or kV according to patient size and/or use of iterative reconstruction technique. CONTRAST:  70mL OMNIPAQUE IOHEXOL 300 MG/ML  SOLN COMPARISON:  09/06/2022 FINDINGS: Lower Chest: No acute findings.  Stable cardiomegaly. Hepatobiliary: No  suspicious hepatic masses identified. Gallbladder is unremarkable. No evidence of biliary ductal dilatation. Pancreas:  No mass or inflammatory changes. Spleen: Prior splenomegaly with stable small accessory splenules. Adrenals/Urinary Tract: No suspicious masses identified. No evidence of ureteral calculi or hydronephrosis. Stomach/Bowel: Mild wall thickening is seen involving the distal gastric body and antrum, suspicious for gastritis, or less likely gastric neoplasm. No evidence of free intraperitoneal air. No evidence  of obstruction, inflammatory process or abnormal fluid collections. Vascular/Lymphatic: No pathologically enlarged lymph nodes. No acute vascular findings. Aortic atherosclerotic calcification incidentally noted. Reproductive:  No mass or other significant abnormality. Other:  None. Musculoskeletal:  No suspicious bone lesions identified. IMPRESSION: Mild wall thickening involving the distal gastric body and antrum suspicious for gastritis, or less likely gastric neoplasm. Consider upper endoscopy for further evaluation. No other acute findings. Aortic Atherosclerosis (ICD10-I70.0). Electronically Signed   By: Danae Orleans M.D.   On: 09/15/2022 19:01    Assessment & Plan:   Problem List Items Addressed This Visit     CRI (chronic renal insufficiency), stage 3 (moderate) (HCC)    Hydrate well      Constipation - Primary    Daily: Dissolve Miralax 1/2 scoop in water  Take Senakot S - 1/2 or 1  If no stool for a few days:  Dissolve Miralax 4-6 scoops in a bottle of water - drink over 2-4 hrs Take 2 Dulcolax tablets twice a day Use Fleet suppository and/or Fleet enema if no result      Gastritis    08/2022 on CT - taking Protonix      Abdominal pain    Treat constipation Abd CT x2 were ok this month         No orders of the defined types were placed in this encounter.     Follow-up: No follow-ups on file.  Sonda Primes, MD

## 2022-09-17 NOTE — Assessment & Plan Note (Signed)
08/2022 on CT - taking Protonix

## 2022-09-17 NOTE — Assessment & Plan Note (Signed)
Daily: Dissolve Miralax 1/2 scoop in water  Take Senakot S - 1/2 or 1  If no stool for a few days:  Dissolve Miralax 4-6 scoops in a bottle of water - drink over 2-4 hrs Take 2 Dulcolax tablets twice a day Use Fleet suppository and/or Fleet enema if no result

## 2022-09-17 NOTE — Patient Instructions (Signed)
Daily: Dissolve Miralax 1/2 scoop in water  Take Senakot S - 1/2 or 1  If no stool for a few days:  Dissolve Miralax 4-6 scoops in a bottle of water - drink over 2-4 hrs Take 2 Dulcolax tablets twice a day Use Fleet suppository and/or Fleet enema if no result

## 2022-09-17 NOTE — Assessment & Plan Note (Signed)
Hydrate well 

## 2022-09-17 NOTE — Assessment & Plan Note (Signed)
Treat constipation Abd CT x2 were ok this month

## 2022-09-21 ENCOUNTER — Telehealth: Payer: Self-pay

## 2022-09-21 ENCOUNTER — Other Ambulatory Visit: Payer: Self-pay | Admitting: Internal Medicine

## 2022-09-21 NOTE — Telephone Encounter (Signed)
Transition Care Management Unsuccessful Follow-up Telephone Call  Date of discharge and from where:  [facility] 7/23  Attempts:  1st Attempt  Reason for unsuccessful TCM follow-up call:  No answer/busy   Lenard Forth [facility] Guide, [facility] [phone] [address] [place], [place], [place] [address] Phone: [phone] Email: [email or web address]

## 2022-09-22 ENCOUNTER — Telehealth: Payer: Self-pay

## 2022-09-22 NOTE — Telephone Encounter (Signed)
Transition Care Management Unsuccessful Follow-up Telephone Call  Date of discharge and from where:  [facility] 7/23  Attempts:  2nd Attempt  Reason for unsuccessful TCM follow-up call:  No answer/busy   Lenard Forth [facility] Guide, [facility] [phone] [address] [place], [place], [place] [address] Phone: [phone] Email: [email or web address]

## 2022-09-23 NOTE — Patient Instructions (Signed)

## 2022-09-23 NOTE — Progress Notes (Unsigned)
No chief complaint on file.   HISTORY OF PRESENT ILLNESS:  09/23/22 ALL:  Mallory Klein returns for follow up for PD. She was last seen 02/2022 and we switched her to pramipexole ER 0.75mg  daily as she was having difficulty remembering to take IR dosing consistently. Since,   03/24/2022 ALL: Mallory Klein returns for follow up for primary parkinsonism. She was last seen by Dr Frances Furbish 10/2021 and advised to continue pramipexole 0.75mg  TID at 12p, 4p and 8p. Since, She reports only taking pramipexole twice, once upon waking around noon and second dose around 8pm. She is unsure if it has helped much with tremor. It is well tolerated. She states that she can not remember to take dose advised around 4pm. She reports caregiver helps with medication dosing. She is eating normally. She is sleeping well. No falls. She continues to use her walker. Tremor is about the same. She does tend to avoid social outings due to tremor.   11/18/2021 SA: Today, 11/18/21: She reports having bilateral knee pain.  She has not fallen recently, she uses a rolling walker.  She takes pramipexole 0.75 mg 3 times daily, she reports taking it at noon, then somewhere around 4 PM and then again at 6 PM on average but is not sure about the exact dose times, she does go to bed around 10 PM but does not sleep well and stays in bed for a prolonged period of time, gets out of bed around noon.  Sometimes she gets confused about her medications.  She has a medication pillbox with multiple slots for a full month but they have not really used this box consistently and do not typically fill it up again.  She tries to hydrate well with water.  She reports that she currently does not take her Seroquel, she also reports that she currently does not take a Butrans patch.  She is not sure about her hydrochlorothiazide.   03/25/2021 ALL: Mallory Klein is a 77 y.o. female here today for follow up for primary parkinsonism. During her last visit with Dr. Frances Furbish, she  was switched to Mirapex 0.125 mg 1 pill daily and titrate up to 3 pills 3 times daily. In November she continued to have shaking and Dr. Frances Furbish increased her dose to  0.5 mg TID. She was also referred to neuro rehab for PT.   She reports that she was unable to start PT due to cost. Her pain doctor also referred her but she is unable to afford the copayment. She feels that she is doing fairly well. She continues to note r hand >left hand tremor. She is unsure if Mirapex is helping much but reports that she is tolerating it well with no obvious adverse effects. She is sleeping well. Memory stable. She ambulates with Rolator. She has had 4 falls over the past year, none recently.   HISTORY (copied from previous note) Mallory Klein is a 77 year old right-handed woman with an underlying medical history of tremors, sleep apnea, iron deficiency anemia, vitamin D deficiency, low back pain, with history of lumbar degenerative spine disease, followed by pain management, reflux disease, hypertension, diverticulosis, asthma, and mild obesity, who presents for follow-up consultation of her Parkinson's disease, for her 67-month checkup.  The patient is accompanied by her husband again today. I last saw her on 05/22/2020, at which time she reported a few falls.  She had seen Dr. Jenne Pane for anosmia.  He ordered a brain MRI.  She had a brain MRI with  and without contrast on 05/27/2020 and I reviewed the results: IMPRESSION: 1. No acute intracranial abnormality. 2. Moderately advanced but nonspecific cerebral white matter signal changes, most commonly due to chronic small vessel disease. 3. Olfactory bulbs are not identified suspicious for atrophy. 4. Mild paranasal sinus inflammation. 5. C3-C4 disc degeneration with mild cervical spinal stenosis.   She was not eating very well.  She had significant nausea.  She was also struggling with constipation and saw GI.   She was advised to continue with Sinemet CR 1 pill 3 times  daily.  SA 11/19/2020: She reports worsening tremors, she has reduced her Sinemet CR to 1 pill twice daily.  She reports that we discussed it at the last visit.  We actually did not plan on reducing her Sinemet CR to twice daily.  At any rate, she is taking it twice daily, she has noticed changes in her posture and her balance.  She has had several falls.  She reports a history of syncope and collapse.  She has seen cardiology recently.  She reports that she lost consciousness.  She had a Holter monitor.  She is in the process of being reevaluated for her sleep apnea as she has a prior diagnosis but has not used her CPAP in years. She no longer has the machine even.  She is scheduled at [facility] to undergo a sleep study per cardiology.  She does report that her falls occurred when she stood up.  She had cardiac work-up in addition with echocardiogram and an EKG.    REVIEW OF SYSTEMS: Out of a complete 14 system review of symptoms, the patient complains only of the following symptoms, leg swelling, tremor, and all other reviewed systems are negative.   ALLERGIES: Allergies  Allergen Reactions   Ace Inhibitors     Patient doesn't recall reaction.  jkl   Benicar [Olmesartan]     It made her sick   Aspirin Other (See Comments)    bruising   Citalopram Hydrobromide Diarrhea     HOME MEDICATIONS: Outpatient Medications Prior to Visit  Medication Sig Dispense Refill   cetirizine (ZYRTEC) 10 MG tablet TAKE ONE TABLET BY MOUTH EVERY DAY 90 tablet 3   Cholecalciferol (VITAMIN D3) 50 MCG (2000 UT) capsule TAKE 1 CAPSULE BY MOUTH EVERY DAY 100 capsule 3   [employer] SENNA PLUS 8.6-50 MG tablet Take 1 tablet by mouth 2 (two) times daily.     cyanocobalamin (VITAMIN B12) 1000 MCG/ML injection INJECT 1 ML INTO THE SKIN EVERY 14 DAYS 2 mL 19   diclofenac Sodium (VOLTAREN) 1 % GEL Apply 4 g topically daily as needed (pain).     docusate sodium (COLACE) 100 MG capsule Take 100 mg by mouth at bedtime.      hydrOXYzine (VISTARIL) 50 MG capsule Take 1-2 capsules (50-100 mg total) by mouth every 6 (six) hours as needed for anxiety. 60 capsule 5   pantoprazole (PROTONIX) 40 MG tablet Take 1 tablet (40 mg total) by mouth 2 (two) times daily. 90 tablet 3   polyethylene glycol (MIRALAX / GLYCOLAX) 17 g packet Take 17 g by mouth daily as needed for mild constipation.     Pramipexole Dihydrochloride 0.75 MG TB24 Take 1 tablet (0.75 mg total) by mouth in the morning and at bedtime. 180 tablet 1   QUEtiapine (SEROQUEL) 100 MG tablet TAKE ONE TABLET BY MOUTH AT BEDTIME 90 tablet 1   rosuvastatin (CRESTOR) 10 MG tablet TAKE ONE TABLET BY MOUTH EVERY  DAY 90 tablet 1   solifenacin (VESICARE) 5 MG tablet TAKE ONE TABLET BY MOUTH EVERY DAY 90 tablet 1   No facility-administered medications prior to visit.     PAST MEDICAL HISTORY: Past Medical History:  Diagnosis Date   Abnormal CBC 02/05/2015   Dr Mosetta Putt 12/16 new: normocytic anemia and thrombocytosis   Adjustment disorder with mixed anxiety and depressed mood 02/15/2007   Chronic  Chronic pain Grief, stress Effexor XR  Dad died in 2023/03/06   Asthma    Diverticulosis of colon (without mention of hemorrhage)    Esophageal stricture    Essential hypertension 01/26/2007   Chronic Verapamil    Falls    "I blackout"   GERD (gastroesophageal reflux disease) 02/15/2007   Chronic     Heart murmur    Hyperglycemia 03/02/2011   Mild     Insomnia disorder 12/08/2018   10/20 Carbid/Lev dose was increased - c/o hard time falling asleep (6 am) getting up at 12 am, poor sleep. Try Temazepam 15-30 mg at 11-1:30 pm    Internal hemorrhoids without mention of complication    Iron deficiency anemia 04/23/2017   Mild neurocognitive disorder, likely due to Parkinson's disease 02/09/2019   OSA (obstructive sleep apnea) 02/14/2010   In lab study (May 2018): AHI 25. Only had 45 minutes of sleep secondary to frequent awakenings secondary to sleep apnea. autocpap 5-15 cm  water.    Osteoarthritis    Parkinson disease Mar 05, 2014   2015 2017  Primidone - d/c 2019 Parkinson's: Sinmet IR. Dr Tat   Postherpetic trigeminal neuralgia 01/26/2007   Qualifier: Diagnosis of  By: Charlsie Quest RMA, [name]     Shortness of breath dyspnea    with allergies   Thrombocytosis 05/24/2017   Urinary incontinence 01/26/2007   Chronic  10/17 Detrol LA    Vitamin D deficiency 01/26/2019     PAST SURGICAL HISTORY: Past Surgical History:  Procedure Laterality Date   ABDOMINAL HYSTERECTOMY     BREAST CYST EXCISION Left 1980's   LUMBAR LAMINECTOMY/DECOMPRESSION MICRODISCECTOMY Bilateral 02/20/2014   Procedure: LUMBAR TWO THREE, LUMBAR THREE FOUR LUMBAR LAMINECTOMY/DECOMPRESSION MICRODISCECTOMY 2 LEVELS;  Surgeon: Tia Alert, MD;  Location: [facility];  Service: Neurosurgery;  Laterality: Bilateral;   SPLENECTOMY     TIBIA FRACTURE SURGERY Right    TIBIA FRACTURE SURGERY Left    TOTAL KNEE ARTHROPLASTY Right 2013     FAMILY HISTORY: Family History  Problem Relation Age of Onset   Hypertension Mother    Diabetes type II Mother    Asthma Mother    Diabetes Mother    Dementia Mother    Prostate cancer Father    Mental illness Father        dementia   Stroke Brother        died in prison   Suicidality Son    Colon cancer Neg Hx    Rectal cancer Neg Hx    Parkinson's disease Neg Hx      SOCIAL HISTORY: Social History   Socioeconomic History   Marital status: Married    Spouse name: Not on file   Number of children: 2   Years of education: 14   Highest education level: Associate degree: occupational, Scientist, product/process development, or vocational program  Occupational History   Occupation: Retired    Comment: Diplomatic Services operational officer, admin assist   Tobacco Use   Smoking status: Never   Smokeless tobacco: Never  Vaping Use   Vaping status: Never Used  Substance and Sexual Activity  Alcohol use: Not Currently    Comment: wine on occ    Drug use: No   Sexual activity: Not Currently  Other  Topics Concern   Not on file  Social History Narrative   Drink tea and coffee on occ   Right handed   1 story    Lives with spouse   Social Determinants of Health   Financial Resource Strain: Low Risk  (06/24/2022)   Overall Financial Resource Strain (CARDIA)    Difficulty of Paying Living Expenses: Not hard at all  Food Insecurity: No Food Insecurity (06/24/2022)   Hunger Vital Sign    Worried About Running Out of Food in the Last Year: Never true    Ran Out of Food in the Last Year: Never true  Transportation Needs: No Transportation Needs (06/24/2022)   PRAPARE - Administrator, Civil Service (Medical): No    Lack of Transportation (Non-Medical): No  Physical Activity: Inactive (06/24/2022)   Exercise Vital Sign    Days of Exercise per Week: 0 days    Minutes of Exercise per Session: 0 min  Stress: No Stress Concern Present (06/24/2022)   [employer] of Occupational Health - Occupational Stress Questionnaire    Feeling of Stress : Not at all  Social Connections: Socially Integrated (06/24/2022)   Social Connection and Isolation Panel [NHANES]    Frequency of Communication with Friends and Family: More than three times a week    Frequency of Social Gatherings with Friends and Family: More than three times a week    Attends Religious Services: More than 4 times per year    Active Member of [employer] or Organizations: Yes    Attends Engineer, structural: More than 4 times per year    Marital Status: Married  Catering manager Violence: Not At Risk (06/24/2022)   Humiliation, Afraid, Rape, and Kick questionnaire    Fear of Current or Ex-Partner: No    Emotionally Abused: No    Physically Abused: No    Sexually Abused: No     PHYSICAL EXAM  There were no vitals filed for this visit.   There is no height or weight on file to calculate BMI.  Generalized: Well developed, in no acute distress  Cardiology: normal rate and rhythm, no murmur auscultated   Respiratory: clear to auscultation bilaterally    Neurological examination  Mentation: Alert oriented to time, place, history taking. Follows all commands speech and language fluent Cranial nerve II-XII: Pupils were equal round reactive to light. Extraocular movements were full, visual field were full on confrontational test. Facial sensation and strength were normal. Uvula tongue midline. Head turning and shoulder shrug  were normal and symmetric. Motor: The motor testing reveals 5 over 5 strength of all 4 extremities with the exception of 4/5 on bilateral hip flexion. Good symmetric motor tone is noted throughout.  Sensory: Sensory testing is intact to soft touch on all 4 extremities. No evidence of extinction is noted.  Coordination: Cerebellar testing reveals good finger-nose-finger and heel-to-shin bilaterally.  Gait and station: Gait is cautious and wide with rollator walker. Tandem gait and romberg deferred.   Reflexes: Deep tendon reflexes are symmetric and normal bilaterally.    DIAGNOSTIC DATA (LABS, IMAGING, TESTING) - I reviewed patient records, labs, notes, testing and imaging myself where available.  Lab Results  Component Value Date   WBC 14.1 (H) 09/15/2022   HGB 12.9 09/15/2022   HCT 40.9 09/15/2022   MCV 89.5 09/15/2022  PLT 456 (H) 09/15/2022      Component Value Date/Time   NA 136 09/15/2022 1556   K 4.4 09/15/2022 1556   CL 96 (L) 09/15/2022 1556   CO2 29 09/15/2022 1556   GLUCOSE 95 09/15/2022 1556   BUN 15 09/15/2022 1556   CREATININE 1.22 (H) 09/15/2022 1556   CREATININE 0.91 09/11/2019 1451   CALCIUM 9.8 09/15/2022 1556   PROT 8.3 (H) 09/15/2022 1556   ALBUMIN 4.0 09/15/2022 1556   AST 18 09/15/2022 1556   ALT 7 09/15/2022 1556   ALKPHOS 57 09/15/2022 1556   BILITOT 0.8 09/15/2022 1556   GFRNONAA 46 (L) 09/15/2022 1556   GFRAA >60 02/28/2018 1544   Lab Results  Component Value Date   CHOL 200 (H) 01/07/2021   HDL 79 01/07/2021   LDLCALC 111  (H) 01/07/2021   LDLDIRECT 109.8 02/10/2010   TRIG 53 01/07/2021   CHOLHDL 2.5 01/07/2021   Lab Results  Component Value Date   HGBA1C 6.1 06/25/2022   Lab Results  Component Value Date   VITAMINB12 939 (H) 07/18/2020   Lab Results  Component Value Date   TSH 0.55 06/25/2022       06/24/2022    2:48 PM  MMSE - Mini Mental State Exam  Not completed: Unable to complete        05/13/2018    4:11 PM  Montreal Cognitive Assessment   Visuospatial/ Executive (0/5) 3  Naming (0/3) 2  Attention: Read list of digits (0/2) 2  Attention: Read list of letters (0/1) 0  Attention: Serial 7 subtraction starting at 100 (0/3) 3  Language: Repeat phrase (0/2) 2  Language : Fluency (0/1) 0  Abstraction (0/2) 2  Delayed Recall (0/5) 1  Orientation (0/6) 6  Total 21     ASSESSMENT AND PLAN  77 y.o. year old female  has a past medical history of Abnormal CBC (02/05/2015), Adjustment disorder with mixed anxiety and depressed mood (02/15/2007), Asthma, Diverticulosis of colon (without mention of hemorrhage), Esophageal stricture, Essential hypertension (01/26/2007), Falls, GERD (gastroesophageal reflux disease) (02/15/2007), Heart murmur, Hyperglycemia (03/02/2011), Insomnia disorder (12/08/2018), Internal hemorrhoids without mention of complication, Iron deficiency anemia (04/23/2017), Mild neurocognitive disorder, likely due to Parkinson's disease (02/09/2019), OSA (obstructive sleep apnea) (02/14/2010), Osteoarthritis, Parkinson disease (02/09/2014), Postherpetic trigeminal neuralgia (01/26/2007), Shortness of breath dyspnea, Thrombocytosis (05/24/2017), Urinary incontinence (01/26/2007), and Vitamin D deficiency (01/26/2019). here with    No diagnosis found.  Mallory Klein is doing fairly well. She is tolerating Mirapex without obvious adverse effects, however, has not noticed significant changes in tremor. She is not taking TID dosing as directed and feels this is too difficult to remember. I  have advised we try pramipexole 0.75mg  in an extended release formulation to see if this will help give more consistent control of symptoms. We have discussed difference in ER and IR dosing. We could also consider PT is she wishes. Fall precautions advised. Healthy lifestyle habits encouraged. I will have her follow up with me in 6 months.   No orders of the defined types were placed in this encounter.  No orders of the defined types were placed in this encounter.   I spent 30 minutes of face-to-face and non-face-to-face time with patient.  This included previsit chart review, lab review, study review, order entry, electronic health record documentation, patient education.    Shawnie Dapper, MSN, [name]-C 09/23/2022, 10:08 AM  [facility] [address], [address] [place], [place] [address] [phone]

## 2022-09-24 ENCOUNTER — Ambulatory Visit (INDEPENDENT_AMBULATORY_CARE_PROVIDER_SITE_OTHER): Payer: Medicare Other | Admitting: Family Medicine

## 2022-09-24 ENCOUNTER — Encounter: Payer: Self-pay | Admitting: Family Medicine

## 2022-09-24 VITALS — BP 107/71 | HR 94 | Ht 61.5 in | Wt 138.0 lb

## 2022-09-24 DIAGNOSIS — G20A1 Parkinson's disease without dyskinesia, without mention of fluctuations: Secondary | ICD-10-CM | POA: Diagnosis not present

## 2022-10-17 ENCOUNTER — Emergency Department (HOSPITAL_BASED_OUTPATIENT_CLINIC_OR_DEPARTMENT_OTHER): Payer: Medicare Other

## 2022-10-17 ENCOUNTER — Encounter (HOSPITAL_BASED_OUTPATIENT_CLINIC_OR_DEPARTMENT_OTHER): Payer: Self-pay | Admitting: Emergency Medicine

## 2022-10-17 ENCOUNTER — Other Ambulatory Visit: Payer: Self-pay

## 2022-10-17 ENCOUNTER — Inpatient Hospital Stay (HOSPITAL_BASED_OUTPATIENT_CLINIC_OR_DEPARTMENT_OTHER)
Admission: EM | Admit: 2022-10-17 | Discharge: 2022-10-21 | DRG: 378 | Disposition: A | Discharge status: Home / Self Care | Payer: Medicare Other | Attending: Family Medicine | Admitting: Family Medicine

## 2022-10-17 DIAGNOSIS — R933 Abnormal findings on diagnostic imaging of other parts of digestive tract: Secondary | ICD-10-CM | POA: Diagnosis not present

## 2022-10-17 DIAGNOSIS — K5900 Constipation, unspecified: Secondary | ICD-10-CM | POA: Diagnosis present

## 2022-10-17 DIAGNOSIS — Z79899 Other long term (current) drug therapy: Secondary | ICD-10-CM

## 2022-10-17 DIAGNOSIS — K259 Gastric ulcer, unspecified as acute or chronic, without hemorrhage or perforation: Secondary | ICD-10-CM | POA: Diagnosis not present

## 2022-10-17 DIAGNOSIS — E871 Hypo-osmolality and hyponatremia: Secondary | ICD-10-CM | POA: Diagnosis present

## 2022-10-17 DIAGNOSIS — D649 Anemia, unspecified: Secondary | ICD-10-CM

## 2022-10-17 DIAGNOSIS — E785 Hyperlipidemia, unspecified: Secondary | ICD-10-CM | POA: Diagnosis present

## 2022-10-17 DIAGNOSIS — R9389 Abnormal findings on diagnostic imaging of other specified body structures: Secondary | ICD-10-CM | POA: Diagnosis not present

## 2022-10-17 DIAGNOSIS — G20A1 Parkinson's disease without dyskinesia, without mention of fluctuations: Secondary | ICD-10-CM | POA: Diagnosis not present

## 2022-10-17 DIAGNOSIS — N3281 Overactive bladder: Secondary | ICD-10-CM | POA: Diagnosis not present

## 2022-10-17 DIAGNOSIS — G4733 Obstructive sleep apnea (adult) (pediatric): Secondary | ICD-10-CM | POA: Diagnosis not present

## 2022-10-17 DIAGNOSIS — R935 Abnormal findings on diagnostic imaging of other abdominal regions, including retroperitoneum: Secondary | ICD-10-CM

## 2022-10-17 DIAGNOSIS — D62 Acute posthemorrhagic anemia: Secondary | ICD-10-CM | POA: Diagnosis not present

## 2022-10-17 DIAGNOSIS — Z6826 Body mass index (BMI) 26.0-26.9, adult: Secondary | ICD-10-CM

## 2022-10-17 DIAGNOSIS — E876 Hypokalemia: Secondary | ICD-10-CM | POA: Diagnosis not present

## 2022-10-17 DIAGNOSIS — Z825 Family history of asthma and other chronic lower respiratory diseases: Secondary | ICD-10-CM

## 2022-10-17 DIAGNOSIS — R634 Abnormal weight loss: Secondary | ICD-10-CM | POA: Diagnosis not present

## 2022-10-17 DIAGNOSIS — K222 Esophageal obstruction: Secondary | ICD-10-CM | POA: Diagnosis not present

## 2022-10-17 DIAGNOSIS — Z1152 Encounter for screening for COVID-19: Secondary | ICD-10-CM

## 2022-10-17 DIAGNOSIS — F32A Depression, unspecified: Secondary | ICD-10-CM | POA: Diagnosis present

## 2022-10-17 DIAGNOSIS — D500 Iron deficiency anemia secondary to blood loss (chronic): Secondary | ICD-10-CM | POA: Diagnosis not present

## 2022-10-17 DIAGNOSIS — D72829 Elevated white blood cell count, unspecified: Secondary | ICD-10-CM | POA: Diagnosis not present

## 2022-10-17 DIAGNOSIS — R0789 Other chest pain: Secondary | ICD-10-CM | POA: Diagnosis present

## 2022-10-17 DIAGNOSIS — K254 Chronic or unspecified gastric ulcer with hemorrhage: Principal | ICD-10-CM | POA: Diagnosis present

## 2022-10-17 DIAGNOSIS — I10 Essential (primary) hypertension: Secondary | ICD-10-CM | POA: Diagnosis not present

## 2022-10-17 DIAGNOSIS — R079 Chest pain, unspecified: Secondary | ICD-10-CM | POA: Diagnosis not present

## 2022-10-17 DIAGNOSIS — Z833 Family history of diabetes mellitus: Secondary | ICD-10-CM

## 2022-10-17 DIAGNOSIS — K449 Diaphragmatic hernia without obstruction or gangrene: Secondary | ICD-10-CM | POA: Diagnosis not present

## 2022-10-17 DIAGNOSIS — K279 Peptic ulcer, site unspecified, unspecified as acute or chronic, without hemorrhage or perforation: Secondary | ICD-10-CM | POA: Diagnosis present

## 2022-10-17 DIAGNOSIS — R1010 Upper abdominal pain, unspecified: Secondary | ICD-10-CM | POA: Diagnosis not present

## 2022-10-17 DIAGNOSIS — Z823 Family history of stroke: Secondary | ICD-10-CM

## 2022-10-17 DIAGNOSIS — F419 Anxiety disorder, unspecified: Secondary | ICD-10-CM | POA: Diagnosis not present

## 2022-10-17 DIAGNOSIS — Z96651 Presence of right artificial knee joint: Secondary | ICD-10-CM | POA: Diagnosis not present

## 2022-10-17 DIAGNOSIS — K219 Gastro-esophageal reflux disease without esophagitis: Secondary | ICD-10-CM | POA: Diagnosis present

## 2022-10-17 DIAGNOSIS — I700 Atherosclerosis of aorta: Secondary | ICD-10-CM | POA: Diagnosis not present

## 2022-10-17 DIAGNOSIS — I517 Cardiomegaly: Secondary | ICD-10-CM | POA: Diagnosis not present

## 2022-10-17 DIAGNOSIS — Z9081 Acquired absence of spleen: Secondary | ICD-10-CM

## 2022-10-17 DIAGNOSIS — K8689 Other specified diseases of pancreas: Secondary | ICD-10-CM | POA: Diagnosis not present

## 2022-10-17 DIAGNOSIS — Z888 Allergy status to other drugs, medicaments and biological substances status: Secondary | ICD-10-CM

## 2022-10-17 DIAGNOSIS — Z8249 Family history of ischemic heart disease and other diseases of the circulatory system: Secondary | ICD-10-CM

## 2022-10-17 DIAGNOSIS — K2210 Ulcer of esophagus without bleeding: Secondary | ICD-10-CM | POA: Diagnosis present

## 2022-10-17 DIAGNOSIS — Z8042 Family history of malignant neoplasm of prostate: Secondary | ICD-10-CM

## 2022-10-17 DIAGNOSIS — Z886 Allergy status to analgesic agent status: Secondary | ICD-10-CM

## 2022-10-17 DIAGNOSIS — E440 Moderate protein-calorie malnutrition: Secondary | ICD-10-CM | POA: Diagnosis present

## 2022-10-17 DIAGNOSIS — K311 Adult hypertrophic pyloric stenosis: Secondary | ICD-10-CM | POA: Diagnosis present

## 2022-10-17 DIAGNOSIS — D7389 Other diseases of spleen: Secondary | ICD-10-CM | POA: Diagnosis not present

## 2022-10-17 DIAGNOSIS — K250 Acute gastric ulcer with hemorrhage: Secondary | ICD-10-CM | POA: Diagnosis not present

## 2022-10-17 DIAGNOSIS — K2970 Gastritis, unspecified, without bleeding: Secondary | ICD-10-CM | POA: Diagnosis not present

## 2022-10-17 DIAGNOSIS — Z818 Family history of other mental and behavioral disorders: Secondary | ICD-10-CM

## 2022-10-17 DIAGNOSIS — J90 Pleural effusion, not elsewhere classified: Secondary | ICD-10-CM | POA: Diagnosis not present

## 2022-10-17 DIAGNOSIS — N281 Cyst of kidney, acquired: Secondary | ICD-10-CM | POA: Diagnosis not present

## 2022-10-17 DIAGNOSIS — R1013 Epigastric pain: Secondary | ICD-10-CM | POA: Diagnosis not present

## 2022-10-17 DIAGNOSIS — G8929 Other chronic pain: Secondary | ICD-10-CM | POA: Diagnosis not present

## 2022-10-17 LAB — COMPREHENSIVE METABOLIC PANEL
ALT: 7 U/L (ref 0–44)
AST: 16 U/L (ref 15–41)
Albumin: 3.6 g/dL (ref 3.5–5.0)
Alkaline Phosphatase: 52 U/L (ref 38–126)
Anion gap: 9 (ref 5–15)
BUN: 21 mg/dL (ref 8–23)
CO2: 30 mmol/L (ref 22–32)
Calcium: 9 mg/dL (ref 8.9–10.3)
Chloride: 96 mmol/L — ABNORMAL LOW (ref 98–111)
Creatinine, Ser: 1.07 mg/dL — ABNORMAL HIGH (ref 0.44–1.00)
GFR, Estimated: 54 mL/min — ABNORMAL LOW (ref >60)
Glucose, Bld: 120 mg/dL — ABNORMAL HIGH (ref 70–99)
Potassium: 3.6 mmol/L (ref 3.5–5.1)
Sodium: 135 mmol/L (ref 135–145)
Total Bilirubin: 0.7 mg/dL (ref 0.3–1.2)
Total Protein: 7.3 g/dL (ref 6.5–8.1)

## 2022-10-17 LAB — CBC
HCT: 35.5 % — ABNORMAL LOW (ref 36.0–46.0)
Hemoglobin: 11.7 g/dL — ABNORMAL LOW (ref 12.0–15.0)
MCH: 28.9 pg (ref 26.0–34.0)
MCHC: 33 g/dL (ref 30.0–36.0)
MCV: 87.7 fL (ref 80.0–100.0)
Platelets: 422 10*3/uL — ABNORMAL HIGH (ref 150–400)
RBC: 4.05 MIL/uL (ref 3.87–5.11)
RDW: 15.5 % (ref 11.5–15.5)
WBC: 19.1 10*3/uL — ABNORMAL HIGH (ref 4.0–10.5)
nRBC: 0 % (ref 0.0–0.2)

## 2022-10-17 LAB — FERRITIN: Ferritin: 45 ng/mL (ref 11–307)

## 2022-10-17 LAB — RETICULOCYTES
Immature Retic Fract: 20.3 % — ABNORMAL HIGH (ref 2.3–15.9)
RBC.: 3.62 MIL/uL — ABNORMAL LOW (ref 3.87–5.11)
Retic Count, Absolute: 77.1 10*3/uL (ref 19.0–186.0)
Retic Ct Pct: 2.1 % (ref 0.4–3.1)

## 2022-10-17 LAB — IRON AND TIBC
Iron: 60 ug/dL (ref 28–170)
Saturation Ratios: 24 % (ref 10.4–31.8)
TIBC: 246 ug/dL — ABNORMAL LOW (ref 250–450)
UIBC: 186 ug/dL

## 2022-10-17 LAB — RESP PANEL BY RT-PCR (RSV, FLU A&B, COVID)  RVPGX2
Influenza A by PCR: NEGATIVE
Influenza B by PCR: NEGATIVE
Resp Syncytial Virus by PCR: NEGATIVE
SARS Coronavirus 2 by RT PCR: NEGATIVE

## 2022-10-17 LAB — TROPONIN I (HIGH SENSITIVITY): Troponin I (High Sensitivity): 14 ng/L (ref <18)

## 2022-10-17 LAB — LACTATE DEHYDROGENASE: LDH: 148 U/L (ref 98–192)

## 2022-10-17 LAB — VITAMIN B12: Vitamin B-12: 356 pg/mL (ref 180–914)

## 2022-10-17 LAB — D-DIMER, QUANTITATIVE: D-Dimer, Quant: 2.48 ug{FEU}/mL — ABNORMAL HIGH (ref 0.00–0.50)

## 2022-10-17 LAB — LIPASE, BLOOD: Lipase: 16 U/L (ref 11–51)

## 2022-10-17 LAB — FOLATE: Folate: 7.5 ng/mL (ref >5.9)

## 2022-10-17 MED ORDER — ACETAMINOPHEN 650 MG RE SUPP: 650.0000 mg | Freq: Four times a day (QID) | RECTAL | Status: DC | PRN

## 2022-10-17 MED ORDER — ONDANSETRON HCL 4 MG/2ML IJ SOLN
4.0000 mg | Freq: Once | INTRAMUSCULAR | Status: AC
  Administered 2022-10-17: 4 mg via INTRAVENOUS
  Filled 2022-10-17: qty 2

## 2022-10-17 MED ORDER — FENTANYL CITRATE PF 50 MCG/ML IJ SOSY
50.0000 ug | PREFILLED_SYRINGE | Freq: Once | INTRAMUSCULAR | Status: AC
  Administered 2022-10-17: 50 ug via INTRAVENOUS
  Filled 2022-10-17: qty 1

## 2022-10-17 MED ORDER — FAMOTIDINE IN NACL 20-0.9 MG/50ML-% IV SOLN
20.0000 mg | Freq: Once | INTRAVENOUS | Status: AC
  Administered 2022-10-17: 20 mg via INTRAVENOUS
  Filled 2022-10-17: qty 50

## 2022-10-17 MED ORDER — PANTOPRAZOLE SODIUM 40 MG IV SOLR
40.0000 mg | Freq: Two times a day (BID) | INTRAVENOUS | Status: DC
  Administered 2022-10-17 – 2022-10-21 (×8): 40 mg via INTRAVENOUS
  Filled 2022-10-17 (×8): qty 10

## 2022-10-17 MED ORDER — KCL IN DEXTROSE-NACL 10-5-0.45 MEQ/L-%-% IV SOLN
INTRAVENOUS | Status: DC
  Filled 2022-10-17 (×5): qty 1000

## 2022-10-17 MED ORDER — SODIUM CHLORIDE 0.9% FLUSH
3.0000 mL | Freq: Two times a day (BID) | INTRAVENOUS | Status: DC
  Administered 2022-10-17 – 2022-10-21 (×5): 3 mL via INTRAVENOUS

## 2022-10-17 MED ORDER — ACETAMINOPHEN 325 MG PO TABS
650.0000 mg | ORAL_TABLET | Freq: Four times a day (QID) | ORAL | Status: DC | PRN
  Filled 2022-10-17 (×2): qty 2

## 2022-10-17 MED ORDER — POLYETHYLENE GLYCOL 3350 17 G PO PACK: 17.0000 g | PACK | Freq: Every day | ORAL | Status: DC | PRN

## 2022-10-17 MED ORDER — LACTATED RINGERS IV BOLUS
1000.0000 mL | Freq: Once | INTRAVENOUS | Status: AC
  Administered 2022-10-17: 1000 mL via INTRAVENOUS

## 2022-10-17 MED ORDER — IOHEXOL 350 MG/ML SOLN
100.0000 mL | Freq: Once | INTRAVENOUS | Status: AC | PRN
  Administered 2022-10-17: 75 mL via INTRAVENOUS

## 2022-10-17 MED ORDER — PRAMIPEXOLE DIHYDROCHLORIDE 0.25 MG PO TABS
0.5000 mg | ORAL_TABLET | Freq: Three times a day (TID) | ORAL | Status: DC
  Administered 2022-10-17 – 2022-10-21 (×12): 0.5 mg via ORAL
  Filled 2022-10-17 (×12): qty 2

## 2022-10-17 NOTE — Assessment & Plan Note (Signed)
Has had about 2 months of intermittent epigastric versus lower chest area discomfort.  CT PE protocol is negative no troponins are positive.  Mild is/lipase is negative.  LFTs are within normal limits.  See CAT scan abdomen pelvis above, suspicion of peptic ulcer disease.  At this time I will treat the patient with pantoprazole, check H. pylori antigen.  [facility] has been engaged by the ER provider I will let them know that the patient is here.

## 2022-10-17 NOTE — ED Triage Notes (Signed)
Patient has been having constipation for on/off last 2 months. She was taking multiple laxatives and did finally have bm. Reports small bm this am.
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
## 2022-10-17 NOTE — ED Triage Notes (Signed)
Pt returns today stating she has been here several times with pain under her left breast, that has not resolved since last visit 2 weeks. Pt is having normal bm's,no problems voiding. No n/v or fevers

## 2022-10-17 NOTE — H&P (Addendum)
History and Physical    Patient: Mallory Klein:[id number] DOB: [birth year] DOA: 10/17/2022 DOS: the patient was seen and examined on 10/17/2022 PCP: Klein, Mallory Quint, MD  Patient coming from: Home  Chief Complaint: No chief complaint on file.  HPI: Mallory Klein is a 77 y.o. female with medical history as listed below. Patinet was in her usual stte of healht till about 2 months ago. She is reporting intermittent epigastric/low anterior chest area discomfort  .  Gnawing/aching sensation without any aggravating or relieving factor.  Intermittent, self resolving at times, sometimes severe.  Patient had a severe episode since yesterday which was interfering with her sleep.  Associated with occasional vomiting nonbloody.  Prompting the patient to come to the ER.  No reports of fever.  Patient does not have any diarrhea, she uses laxatives, no rash on his skin no trauma  ER workup as noted below, patient is currently pain-free when examined.  And would like to rest. Review of Systems: As mentioned in the history of present illness. All other systems reviewed and are negative. Past Medical History:  Diagnosis Date   Abnormal CBC 02/05/2015   Dr Mosetta Putt 12/16 new: normocytic anemia and thrombocytosis   Adjustment disorder with mixed anxiety and depressed mood 02/15/2007   Chronic  Chronic pain Grief, stress Effexor XR  Dad died in 17-Feb-2023   Asthma    Diverticulosis of colon (without mention of hemorrhage)    Esophageal stricture    Essential hypertension 01/26/2007   Chronic Verapamil    Falls    "I blackout"   GERD (gastroesophageal reflux disease) 02/15/2007   Chronic     Heart murmur    Hyperglycemia 03/02/2011   Mild     Insomnia disorder 12/08/2018   10/20 Carbid/Lev dose was increased - c/o hard time falling asleep (6 am) getting up at 12 am, poor sleep. Try Temazepam 15-30 mg at 11-1:30 pm    Internal hemorrhoids without mention of complication    Iron deficiency anemia  04/23/2017   Mild neurocognitive disorder, likely due to Parkinson's disease 02/09/2019   OSA (obstructive sleep apnea) 02/14/2010   In lab study (May 2018): AHI 25. Only had 45 minutes of sleep secondary to frequent awakenings secondary to sleep apnea. autocpap 5-15 cm water.    Osteoarthritis    Parkinson disease February 16, 2014   2015 2017  Primidone - d/c 2019 Parkinson's: [facility] IR. Dr Tat   Postherpetic trigeminal neuralgia 01/26/2007   Qualifier: Diagnosis of  By: Charlsie Quest RMA, [name]     Shortness of breath dyspnea    with allergies   Thrombocytosis 05/24/2017   Urinary incontinence 01/26/2007   Chronic  10/17 Detrol LA    Vitamin D deficiency 01/26/2019   Past Surgical History:  Procedure Laterality Date   ABDOMINAL HYSTERECTOMY     BREAST CYST EXCISION Left 1980's   LUMBAR LAMINECTOMY/DECOMPRESSION MICRODISCECTOMY Bilateral 02/20/2014   Procedure: LUMBAR TWO THREE, LUMBAR THREE FOUR LUMBAR LAMINECTOMY/DECOMPRESSION MICRODISCECTOMY 2 LEVELS;  Surgeon: Tia Alert, MD;  Location: [facility];  Service: Neurosurgery;  Laterality: Bilateral;   SPLENECTOMY     TIBIA FRACTURE SURGERY Right    TIBIA FRACTURE SURGERY Left    TOTAL KNEE ARTHROPLASTY Right 2013   Social History:  reports that she has never smoked. She has never used smokeless tobacco. She reports that she does not currently use alcohol. She reports that she does not use drugs.  Allergies  Allergen Reactions   Ace Inhibitors  Patient doesn't recall reaction.  jkl   Benicar [Olmesartan]     It made her sick   Aspirin Other (See Comments)    bruising   Citalopram Hydrobromide Diarrhea    Family History  Problem Relation Age of Onset   Hypertension Mother    Diabetes type II Mother    Asthma Mother    Diabetes Mother    Dementia Mother    Prostate cancer Father    Mental illness Father        dementia   Stroke Brother        died in prison   Suicidality Son    Colon cancer Neg Hx    Rectal cancer Neg Hx     Parkinson's disease Neg Hx     Prior to Admission medications   Medication Sig Start Date End Date Taking? Authorizing Provider  cetirizine (ZYRTEC) 10 MG tablet TAKE ONE TABLET BY MOUTH EVERY DAY 09/21/22   Klein, Mallory Quint, MD  Cholecalciferol (VITAMIN D3) 50 MCG (2000 UT) capsule TAKE 1 CAPSULE BY MOUTH EVERY DAY 09/09/21   Klein, Mallory Quint, MD  [employer] SENNA PLUS 8.6-50 MG tablet Take 1 tablet by mouth 2 (two) times daily. 09/26/20   [provider]  cyanocobalamin (VITAMIN B12) 1000 MCG/ML injection INJECT 1 ML INTO THE SKIN EVERY 14 DAYS 10/20/21   Corwin Levins, MD  diclofenac Sodium (VOLTAREN) 1 % GEL Apply 4 g topically daily as needed (pain). 04/25/13   [provider]  docusate sodium (COLACE) 100 MG capsule Take 100 mg by mouth at bedtime. 06/26/19   [provider]  hydrOXYzine (VISTARIL) 50 MG capsule Take 1-2 capsules (50-100 mg total) by mouth every 6 (six) hours as needed for anxiety. 10/03/21   Corwin Levins, MD  pantoprazole (PROTONIX) 40 MG tablet Take 1 tablet (40 mg total) by mouth 2 (two) times daily. 09/17/22   Klein, Mallory Quint, MD  polyethylene glycol (MIRALAX / GLYCOLAX) 17 g packet Take 17 g by mouth daily as needed for mild constipation.    [provider]  Pramipexole Dihydrochloride 0.75 MG TB24 Take 1 tablet (0.75 mg total) by mouth in the morning and at bedtime. 06/25/22   [name], [name], NP  QUEtiapine (SEROQUEL) 100 MG tablet TAKE ONE TABLET BY MOUTH AT BEDTIME 09/16/22   Klein, Mallory Quint, MD  rosuvastatin (CRESTOR) 10 MG tablet TAKE ONE TABLET BY MOUTH EVERY DAY 09/16/22   Klein, Mallory Quint, MD  solifenacin (VESICARE) 5 MG tablet TAKE ONE TABLET BY MOUTH EVERY DAY 09/16/22   Klein, Mallory Quint, MD  donepezil (ARICEPT) 5 MG tablet Take 1 tablet (5 mg total) by mouth at bedtime. Patient not taking: No sig reported 11/27/19 07/23/20  Klein, Mallory Quint, MD  venlafaxine XR (EFFEXOR-XR) 150 MG 24 hr capsule TAKE 1 CAPSULE BY MOUTH   EVERY DAY WITH BREAKFAST Patient not taking: Reported on 07/21/2020 05/29/19 07/23/20  Klein, Mallory Quint, MD    Physical Exam: Vitals:   10/17/22 1530 10/17/22 1615 10/17/22 1630 10/17/22 1736  BP: (!) 155/102  (!) 164/120 (!) 159/91  Pulse: 95 79 84 81  Resp:  14 17   Temp:  99.4 F (37.4 C) 99.4 F (37.4 C) 99 F (37.2 C)  TempSrc:  Oral Oral Oral  SpO2: 93% 97% 96% 100%   Interval: Patient was sleeping when initially encountered, arousable, no distress.  Tired Respiratory exam: Bilateral intravesical Cardiovascular exam S1-S2 normal Abdomen all quadrants soft nontender Extremities warm without  edema Data Reviewed:  Labs on Admission:  Results for orders placed or performed during the hospital encounter of 10/17/22 (from the past 24 hour(s))  CBC     Status: Abnormal   Collection Time: 10/17/22 11:38 AM  Result Value Ref Range   WBC 19.1 (H) 4.0 - 10.5 K/uL   RBC 4.05 3.87 - 5.11 MIL/uL   Hemoglobin 11.7 (L) 12.0 - 15.0 g/dL   HCT 62.9 (L) 52.8 - 41.3 %   MCV 87.7 80.0 - 100.0 fL   MCH 28.9 26.0 - 34.0 pg   MCHC 33.0 30.0 - 36.0 g/dL   RDW 24.4 01.0 - 27.2 %   Platelets 422 (H) 150 - 400 K/uL   nRBC 0.0 0.0 - 0.2 %  Troponin I (High Sensitivity)     Status: None   Collection Time: 10/17/22 11:38 AM  Result Value Ref Range   Troponin I (High Sensitivity) 14 <18 ng/L  Comprehensive metabolic panel     Status: Abnormal   Collection Time: 10/17/22 11:38 AM  Result Value Ref Range   Sodium 135 135 - 145 mmol/L   Potassium 3.6 3.5 - 5.1 mmol/L   Chloride 96 (L) 98 - 111 mmol/L   CO2 30 22 - 32 mmol/L   Glucose, Bld 120 (H) 70 - 99 mg/dL   BUN 21 8 - 23 mg/dL   Creatinine, Ser 5.36 (H) 0.44 - 1.00 mg/dL   Calcium 9.0 8.9 - 64.4 mg/dL   Total Protein 7.3 6.5 - 8.1 g/dL   Albumin 3.6 3.5 - 5.0 g/dL   AST 16 15 - 41 U/L   ALT 7 0 - 44 U/L   Alkaline Phosphatase 52 38 - 126 U/L   Total Bilirubin 0.7 0.3 - 1.2 mg/dL   GFR, Estimated 54 (L) >60 mL/min   Anion gap 9 5  - 15  Lipase, blood     Status: None   Collection Time: 10/17/22 11:38 AM  Result Value Ref Range   Lipase 16 11 - 51 U/L  Resp panel by RT-PCR (RSV, Flu A&B, Covid) Anterior Nasal Swab     Status: None   Collection Time: 10/17/22 11:38 AM   Specimen: Anterior Nasal Swab  Result Value Ref Range   SARS Coronavirus 2 by RT PCR NEGATIVE NEGATIVE   Influenza A by PCR NEGATIVE NEGATIVE   Influenza B by PCR NEGATIVE NEGATIVE   Resp Syncytial Virus by PCR NEGATIVE NEGATIVE  D-dimer, quantitative     Status: Abnormal   Collection Time: 10/17/22 11:38 AM  Result Value Ref Range   D-Dimer, Quant 2.48 (H) 0.00 - 0.50 ug/mL-FEU   Basic Metabolic Panel: Recent Labs  Lab 10/17/22 1138  NA 135  K 3.6  CL 96*  CO2 30  GLUCOSE 120*  BUN 21  CREATININE 1.07*  CALCIUM 9.0   Liver Function Tests: Recent Labs  Lab 10/17/22 1138  AST 16  ALT 7  ALKPHOS 52  BILITOT 0.7  PROT 7.3  ALBUMIN 3.6   Recent Labs  Lab 10/17/22 1138  LIPASE 16   No results for input(s): "AMMONIA" in the last 168 hours. CBC: Recent Labs  Lab 10/17/22 1138  WBC 19.1*  HGB 11.7*  HCT 35.5*  MCV 87.7  PLT 422*   Cardiac Enzymes: Recent Labs  Lab 10/17/22 1138  TROPONINIHS 14    BNP (last 3 results) No results for input(s): "PROBNP" in the last 8760 hours. CBG: No results for input(s): "GLUCAP" in the last 168  hours.  Radiological Exams on Admission:  CT ABDOMEN PELVIS W CONTRAST  Result Date: 10/17/2022 CLINICAL DATA:  Acute left upper abdominal pain EXAM: CT ABDOMEN AND PELVIS WITH CONTRAST TECHNIQUE: Multidetector CT imaging of the abdomen and pelvis was performed using the standard protocol following bolus administration of intravenous contrast. RADIATION DOSE REDUCTION: This exam was performed according to the departmental dose-optimization program which includes automated exposure control, adjustment of the mA and/or kV according to patient size and/or use of iterative reconstruction  technique. CONTRAST:  75mL OMNIPAQUE IOHEXOL 350 MG/ML SOLN COMPARISON:  09/15/2022 FINDINGS: Lower chest: Mild cardiomegaly. Hepatobiliary: Unremarkable Pancreas: Atrophic pancreas with borderline dilatation of the dorsal pancreatic duct. Otherwise unremarkable. Spleen: Absent aside from small regenerative splenic lesions, compatible with splenectomy. Adrenals/Urinary Tract: Benign bilateral renal cysts. No further imaging workup of these lesions is indicated. Adrenal glands unremarkable.  Urinary bladder appears unremarkable. Stomach/Bowel: Abnormal wall thickening in the gastric antrum compatible with gastritis. Suspected large cephalad ulcer of the stomach on image 60 series 5. Underlying gastric malignancy not excluded. Normal appendix.  No dilated bowel. Vascular/Lymphatic: Atherosclerosis is present, including aortoiliac atherosclerotic disease. Reproductive: Uterus absent. Other: No supplemental non-categorized findings. Musculoskeletal: Moderate degenerative arthropathy of both hips. Lower thoracic and lumbar spondylosis and degenerative disc disease causing multilevel impingement. Previous posterior decompression at L3. IMPRESSION: 1. Abnormal wall thickening in the gastric antrum suggesting potentially severe gastritis. Suspected large cephalad ulcer of the stomach antrum. No current free intraperitoneal gas. Underlying gastric malignancy not excluded. Follow up endoscopy is recommended. 2. Aortic atherosclerosis. 3. Mild cardiomegaly. 4. Moderate degenerative arthropathy of both hips. 5. Lower thoracic and lumbar spondylosis and degenerative disc disease causing multilevel impingement. Aortic Atherosclerosis (ICD10-I70.0). Electronically Signed   By: Gaylyn Rong M.D.   On: 10/17/2022 13:29   CT Angio Chest PE W and/or Wo Contrast  Result Date: 10/17/2022 CLINICAL DATA:  PE suspected, left chest pain EXAM: CT ANGIOGRAPHY CHEST WITH CONTRAST TECHNIQUE: Multidetector CT imaging of the chest was  performed using the standard protocol during bolus administration of intravenous contrast. Multiplanar CT image reconstructions and MIPs were obtained to evaluate the vascular anatomy. RADIATION DOSE REDUCTION: This exam was performed according to the departmental dose-optimization program which includes automated exposure control, adjustment of the mA and/or kV according to patient size and/or use of iterative reconstruction technique. CONTRAST:  75mL OMNIPAQUE IOHEXOL 350 MG/ML SOLN COMPARISON:  None Available. FINDINGS: Cardiovascular: Satisfactory opacification of the pulmonary arteries to the segmental level. No evidence of pulmonary embolism. Cardiomegaly. No pericardial effusion. Aortic atherosclerosis Mediastinum/Nodes: No enlarged mediastinal, hilar, or axillary lymph nodes. Thyroid gland, trachea, and esophagus demonstrate no significant findings. Lungs/Pleura: Trace pleural effusions.  No acute airspace opacity. Upper Abdomen: Please see separately reported examination of the abdomen pelvis Musculoskeletal: No chest wall abnormality. No acute osseous findings. Review of the MIP images confirms the above findings. IMPRESSION: 1. Negative examination for pulmonary embolism. 2. Trace pleural effusions. No acute airspace opacity. 3. Cardiomegaly. Aortic Atherosclerosis (ICD10-I70.0). Electronically Signed   By: Jearld Lesch M.D.   On: 10/17/2022 13:20   DG Chest Port 1 View  Result Date: 10/17/2022 CLINICAL DATA:  Left-sided chest pain EXAM: PORTABLE CHEST 1 VIEW COMPARISON:  05/12/2016 FINDINGS: Cardiomegaly. Unchanged elevation of the left hemidiaphragm. Both lungs are clear. The visualized skeletal structures are unremarkable. IMPRESSION: Cardiomegaly without acute abnormality of the lungs in AP portable projection. Electronically Signed   By: Jearld Lesch M.D.   On: 10/17/2022 12:18    EKG: Independently reviewed.  NSR   Assessment and Plan: * Gastric ulcer Has had about 2 months of  intermittent epigastric versus lower chest area discomfort.  CT PE protocol is negative no troponins are positive.  Mild is/lipase is negative.  LFTs are within normal limits.  See CAT scan abdomen pelvis above, suspicion of peptic ulcer disease.  At this time I will treat the patient with pantoprazole, check H. pylori antigen.   GI has been engaged by the ER provider I will let them know that the patient is here.  Anemia Anemia panel has been ordered including LDH haptoglobin and electrophoresis  Leukocytosis White cell count of 19,000.  Clinically does not look infected.  However we will follow-up the differential tomorrow.  For thoroughness of workup I will do a blood culture.  Seems to be worsening over the last 1 month.   Patient home medication reconciliation is pending as patient was sleeping at this time when pharmacy visited.  The most recent list I have is from 09/24/2022 from neurology note:        Outpatient Medications Prior to Visit  Medication Sig Dispense Refill   cetirizine (ZYRTEC) 10 MG tablet TAKE ONE TABLET BY MOUTH EVERY DAY 90 tablet 3   Cholecalciferol (VITAMIN D3) 50 MCG (2000 UT) capsule TAKE 1 CAPSULE BY MOUTH EVERY DAY 100 capsule 3   [facility] SENNA PLUS 8.6-50 MG tablet Take 1 tablet by mouth 2 (two) times daily.       cyanocobalamin (VITAMIN B12) 1000 MCG/ML injection INJECT 1 ML INTO THE SKIN EVERY 14 DAYS 2 mL 19   diclofenac Sodium (VOLTAREN) 1 % GEL Apply 4 g topically daily as needed (pain).       docusate sodium (COLACE) 100 MG capsule Take 100 mg by mouth at bedtime.       hydrOXYzine (VISTARIL) 50 MG capsule Take 1-2 capsules (50-100 mg total) by mouth every 6 (six) hours as needed for anxiety. 60 capsule 5   pantoprazole (PROTONIX) 40 MG tablet Take 1 tablet (40 mg total) by mouth 2 (two) times daily. 90 tablet 3   polyethylene glycol (MIRALAX / GLYCOLAX) 17 g packet Take 17 g by mouth daily as needed for mild constipation.       Pramipexole  Dihydrochloride 0.75 MG TB24 Take 1 tablet (0.75 mg total) by mouth in the morning and at bedtime. 180 tablet 1   QUEtiapine (SEROQUEL) 100 MG tablet TAKE ONE TABLET BY MOUTH AT BEDTIME 90 tablet 1   rosuvastatin (CRESTOR) 10 MG tablet TAKE ONE TABLET BY MOUTH EVERY DAY 90 tablet 1   solifenacin (VESICARE) 5 MG tablet TAKE ONE TABLET BY MOUTH EVERY DAY 90 tablet      Advance Care Planning:   Code Status: Full Code   Consults: GI as above.  Family Communication: deferred to patient.  Severity of Illness: The appropriate patient status for this patient is OBSERVATION. Observation status is judged to be reasonable and necessary in order to provide the required intensity of service to ensure the patient's safety. The patient's presenting symptoms, physical exam findings, and initial radiographic and laboratory data in the context of their medical condition is felt to place them at decreased risk for further clinical deterioration. Furthermore, it is anticipated that the patient will be medically stable for discharge from the hospital within 2 midnights of admission.   Author: Nolberto Hanlon, MD 10/17/2022 7:53 PM  For on call review [email or web address].

## 2022-10-17 NOTE — ED Provider Notes (Signed)
[place] EMERGENCY DEPARTMENT AT [facility] Provider Note   CSN: [id number] Arrival date & time: 10/17/22  1039     History  No chief complaint on file.   Mallory Klein is a 77 y.o. female.  HPI   77 year old female presenting to the emergency department with multiple complaints.  The patient states that she has had intermittent left upper quadrant and epigastric and substernal chest pain for the last 2 months.  She has had intermittent episodes of constipation as well and had been taking laxatives and finally had a bowel movement yesterday.  She has had multiple bowel movements since and now is passing liquid stool.  She had been taking Dulcolax and MiraLAX outpatient.  She states that she is primarily here [place] because of severe epigastric and substernal chest discomfort.  She does not describe it as a burning and is more a dull discomfort.  No significant radiation.  Mild cough, no significant shortness of breath.  No nausea, vomiting, fevers.  She is tolerating oral intake.  She is unsure if she is passing gas.    Home Medications Prior to Admission medications   Medication Sig Start Date End Date Taking? Authorizing Provider  cetirizine (ZYRTEC) 10 MG tablet TAKE ONE TABLET BY MOUTH EVERY DAY 09/21/22   Plotnikov, Georgina Quint, MD  Cholecalciferol (VITAMIN D3) 50 MCG (2000 UT) capsule TAKE 1 CAPSULE BY MOUTH EVERY DAY 09/09/21   Plotnikov, Georgina Quint, MD  [employer] SENNA PLUS 8.6-50 MG tablet Take 1 tablet by mouth 2 (two) times daily. 09/26/20   [provider]  cyanocobalamin (VITAMIN B12) 1000 MCG/ML injection INJECT 1 ML INTO THE SKIN EVERY 14 DAYS 10/20/21   Corwin Levins, MD  diclofenac Sodium (VOLTAREN) 1 % GEL Apply 4 g topically daily as needed (pain). 04/25/13   [provider]  docusate sodium (COLACE) 100 MG capsule Take 100 mg by mouth at bedtime. 06/26/19   [provider]  hydrOXYzine (VISTARIL) 50 MG capsule Take 1-2 capsules (50-100 mg  total) by mouth every 6 (six) hours as needed for anxiety. 10/03/21   Corwin Levins, MD  pantoprazole (PROTONIX) 40 MG tablet Take 1 tablet (40 mg total) by mouth 2 (two) times daily. 09/17/22   Plotnikov, Georgina Quint, MD  polyethylene glycol (MIRALAX / GLYCOLAX) 17 g packet Take 17 g by mouth daily as needed for mild constipation.    [provider]  Pramipexole Dihydrochloride 0.75 MG TB24 Take 1 tablet (0.75 mg total) by mouth in the morning and at bedtime. 06/25/22   [name], [name], NP  QUEtiapine (SEROQUEL) 100 MG tablet TAKE ONE TABLET BY MOUTH AT BEDTIME 09/16/22   Plotnikov, Georgina Quint, MD  rosuvastatin (CRESTOR) 10 MG tablet TAKE ONE TABLET BY MOUTH EVERY DAY 09/16/22   Plotnikov, Georgina Quint, MD  solifenacin (VESICARE) 5 MG tablet TAKE ONE TABLET BY MOUTH EVERY DAY 09/16/22   Plotnikov, Georgina Quint, MD  donepezil (ARICEPT) 5 MG tablet Take 1 tablet (5 mg total) by mouth at bedtime. Patient not taking: No sig reported 11/27/19 07/23/20  Plotnikov, Georgina Quint, MD  venlafaxine XR (EFFEXOR-XR) 150 MG 24 hr capsule TAKE 1 CAPSULE BY MOUTH  EVERY DAY WITH BREAKFAST Patient not taking: Reported on 07/21/2020 05/29/19 07/23/20  Plotnikov, Georgina Quint, MD      Allergies    Ace inhibitors, Benicar [olmesartan], Aspirin, and Citalopram hydrobromide    Review of Systems   Review of Systems  All other systems reviewed and are negative.  Physical Exam Updated Vital Signs BP (!) 166/89   Pulse 76   Temp 99.3 F (37.4 C) (Oral)   Resp 16   SpO2 94%  Physical Exam Vitals and nursing note reviewed.  Constitutional:      General: She is not in acute distress.    Appearance: She is well-developed.  HENT:     Head: Normocephalic and atraumatic.  Eyes:     Conjunctiva/sclera: Conjunctivae normal.  Cardiovascular:     Rate and Rhythm: Normal rate and regular rhythm.     Heart sounds: No murmur heard. Pulmonary:     Effort: Pulmonary effort is normal. No respiratory distress.     Breath sounds: Normal  breath sounds.  Abdominal:     Palpations: Abdomen is soft.     Tenderness: There is abdominal tenderness in the epigastric area and left upper quadrant. There is no guarding or rebound.  Musculoskeletal:        General: No swelling.     Cervical back: Neck supple.  Skin:    General: Skin is warm and dry.     Capillary Refill: Capillary refill takes less than 2 seconds.  Neurological:     Mental Status: She is alert.  Psychiatric:        Mood and Affect: Mood normal.     ED Results / Procedures / Treatments   Labs (all labs ordered are listed, but only abnormal results are displayed) Labs Reviewed  CBC - Abnormal; Notable for the following components:      Result Value   WBC 19.1 (*)    Hemoglobin 11.7 (*)    HCT 35.5 (*)    Platelets 422 (*)    All other components within normal limits  COMPREHENSIVE METABOLIC PANEL - Abnormal; Notable for the following components:   Chloride 96 (*)    Glucose, Bld 120 (*)    Creatinine, Ser 1.07 (*)    GFR, Estimated 54 (*)    All other components within normal limits  D-DIMER, QUANTITATIVE - Abnormal; Notable for the following components:   D-Dimer, Quant 2.48 (*)    All other components within normal limits  RESP PANEL BY RT-PCR (RSV, FLU A&B, COVID)  [id number]  LIPASE, BLOOD  TROPONIN I (HIGH SENSITIVITY)    EKG EKG Interpretation Date/Time:  Saturday October 17 2022 11:29:43 EDT Ventricular Rate:  77 PR Interval:  148 QRS Duration:  97 QT Interval:  406 QTC Calculation: 460 R Axis:   -65  Text Interpretation: Sinus rhythm Left anterior fascicular block Anterior infarct, old Borderline repolarization abnormality Confirmed by Ernie Avena ([id number]) on 10/17/2022 11:31:52 AM  Radiology CT ABDOMEN PELVIS W CONTRAST  Result Date: 10/17/2022 CLINICAL DATA:  Acute left upper abdominal pain EXAM: CT ABDOMEN AND PELVIS WITH CONTRAST TECHNIQUE: Multidetector CT imaging of the abdomen and pelvis was performed using the standard protocol  following bolus administration of intravenous contrast. RADIATION DOSE REDUCTION: This exam was performed according to the departmental dose-optimization program which includes automated exposure control, adjustment of the mA and/or kV according to patient size and/or use of iterative reconstruction technique. CONTRAST:  75mL OMNIPAQUE IOHEXOL 350 MG/ML SOLN COMPARISON:  09/15/2022 FINDINGS: Lower chest: Mild cardiomegaly. Hepatobiliary: Unremarkable Pancreas: Atrophic pancreas with borderline dilatation of the dorsal pancreatic duct. Otherwise unremarkable. Spleen: Absent aside from small regenerative splenic lesions, compatible with splenectomy. Adrenals/Urinary Tract: Benign bilateral renal cysts. No further imaging workup of these lesions is indicated. Adrenal glands unremarkable.  Urinary bladder appears unremarkable. Stomach/Bowel: Abnormal wall  thickening in the gastric antrum compatible with gastritis. Suspected large cephalad ulcer of the stomach on image 60 series 5. Underlying gastric malignancy not excluded. Normal appendix.  No dilated bowel. Vascular/Lymphatic: Atherosclerosis is present, including aortoiliac atherosclerotic disease. Reproductive: Uterus absent. Other: No supplemental non-categorized findings. Musculoskeletal: Moderate degenerative arthropathy of both hips. Lower thoracic and lumbar spondylosis and degenerative disc disease causing multilevel impingement. Previous posterior decompression at L3. IMPRESSION: 1. Abnormal wall thickening in the gastric antrum suggesting potentially severe gastritis. Suspected large cephalad ulcer of the stomach antrum. No current free intraperitoneal gas. Underlying gastric malignancy not excluded. Follow up endoscopy is recommended. 2. Aortic atherosclerosis. 3. Mild cardiomegaly. 4. Moderate degenerative arthropathy of both hips. 5. Lower thoracic and lumbar spondylosis and degenerative disc disease causing multilevel impingement. Aortic Atherosclerosis  (ICD10-I70.0). Electronically Signed   By: Gaylyn Rong M.D.   On: 10/17/2022 13:29   CT Angio Chest PE W and/or Wo Contrast  Result Date: 10/17/2022 CLINICAL DATA:  PE suspected, left chest pain EXAM: CT ANGIOGRAPHY CHEST WITH CONTRAST TECHNIQUE: Multidetector CT imaging of the chest was performed using the standard protocol during bolus administration of intravenous contrast. Multiplanar CT image reconstructions and MIPs were obtained to evaluate the vascular anatomy. RADIATION DOSE REDUCTION: This exam was performed according to the departmental dose-optimization program which includes automated exposure control, adjustment of the mA and/or kV according to patient size and/or use of iterative reconstruction technique. CONTRAST:  75mL OMNIPAQUE IOHEXOL 350 MG/ML SOLN COMPARISON:  None Available. FINDINGS: Cardiovascular: Satisfactory opacification of the pulmonary arteries to the segmental level. No evidence of pulmonary embolism. Cardiomegaly. No pericardial effusion. Aortic atherosclerosis Mediastinum/Nodes: No enlarged mediastinal, hilar, or axillary lymph nodes. Thyroid gland, trachea, and esophagus demonstrate no significant findings. Lungs/Pleura: Trace pleural effusions.  No acute airspace opacity. Upper Abdomen: Please see separately reported examination of the abdomen pelvis Musculoskeletal: No chest wall abnormality. No acute osseous findings. Review of the MIP images confirms the above findings. IMPRESSION: 1. Negative examination for pulmonary embolism. 2. Trace pleural effusions. No acute airspace opacity. 3. Cardiomegaly. Aortic Atherosclerosis (ICD10-I70.0). Electronically Signed   By: Jearld Lesch M.D.   On: 10/17/2022 13:20   DG Chest Port 1 View  Result Date: 10/17/2022 CLINICAL DATA:  Left-sided chest pain EXAM: PORTABLE CHEST 1 VIEW COMPARISON:  05/12/2016 FINDINGS: Cardiomegaly. Unchanged elevation of the left hemidiaphragm. Both lungs are clear. The visualized skeletal  structures are unremarkable. IMPRESSION: Cardiomegaly without acute abnormality of the lungs in AP portable projection. Electronically Signed   By: Jearld Lesch M.D.   On: 10/17/2022 12:18    Procedures Procedures    Medications Ordered in ED Medications  famotidine (PEPCID) IVPB 20 mg premix (has no administration in time range)  fentaNYL (SUBLIMAZE) injection 50 mcg (has no administration in time range)  lactated ringers bolus 1,000 mL (has no administration in time range)  ondansetron (ZOFRAN) injection 4 mg (4 mg Intravenous Given 10/17/22 1255)  iohexol (OMNIPAQUE) 350 MG/ML injection 100 mL (75 mLs Intravenous Contrast Given 10/17/22 1251)    ED Course/ Medical Decision Making/ A&P                                 Medical Decision Making Amount and/or Complexity of Data Reviewed Labs: ordered. Radiology: ordered.  Risk Prescription drug management.     77 year old female presenting to the emergency department with multiple complaints.  The patient states that she has had intermittent left  upper quadrant and epigastric and substernal chest pain for the last 2 months.  She has had intermittent episodes of constipation as well and had been taking laxatives and finally had a bowel movement yesterday.  She has had multiple bowel movements since and now is passing liquid stool.  She had been taking Dulcolax and MiraLAX outpatient.  She states that she is primarily here [place] because of severe epigastric and substernal chest discomfort.  She does not describe it as a burning and is more a dull discomfort.  No significant radiation.  Mild cough, no significant shortness of breath.  No nausea, vomiting, fevers.  She is tolerating oral intake.  She is unsure if she is passing gas.  On arrival, the patient was afebrile, not tachycardic or tachypneic, hypertensive BP 160/95, saturating 96% on room air.  Sinus rhythm noted on cardiac telemetry.  Physical exam significant for left upper quadrant  and epigastric tenderness to palpation.  Differential diagnosis includes peptic ulcer disease, gastritis, malignancy, pancreatitis, ACS, PE, pneumonia, pneumothorax, viral infection, Small bowel obstruction.  Initial EKG revealed sinus rhythm, ventricular rate 77, no acute ischemic changes.  Initial checks x-ray revealed cardiomegaly without abnormality of the lungs.  Labs: COVID-19 and influenza PCR testing negative, CBC with a leukocytosis to 19.1, hemoglobin 11.7, troponin 14, lipase 16, D-dimer elevated to 2.48, CMP without significant electrolyte abnormality, normal renal and liver function.  CTA PE: IMPRESSION:  1. Negative examination for pulmonary embolism.  2. Trace pleural effusions. No acute airspace opacity.  3. Cardiomegaly.   CT Abdomen Pelvis: IMPRESSION:  1. Abnormal wall thickening in the gastric antrum suggesting  potentially severe gastritis. Suspected large cephalad ulcer of the  stomach antrum. No current free intraperitoneal gas. Underlying  gastric malignancy not excluded. Follow up endoscopy is recommended.  2. Aortic atherosclerosis.  3. Mild cardiomegaly.  4. Moderate degenerative arthropathy of both hips.  5. Lower thoracic and lumbar spondylosis and degenerative disc  disease causing multilevel impingement.    Aortic Atherosclerosis (ICD10-I70.0).   Given the findings on CT abdomen pelvis, gastroenterology was consulted.  Patient was administered IV Pepcid in addition to IV fentanyl.  Zofran was provided for nausea.  Discussed the results of testing with the patient.  Dr. Bosie Clos of [facility] said that the patient is a [facility] patient, [facility] consulted. Spoke with PA Zerita Boers, will see the patient in consultation in the hospital, can admit to [facility] or [facility].  Hospitalist medicine consulted for admission, Dr. Rhona Leavens accepting.   Final Clinical Impression(s) / ED Diagnoses Final diagnoses:  Epigastric pain  Peptic ulcer  Gastritis, presence of  bleeding unspecified, unspecified chronicity, unspecified gastritis type    Rx / DC Orders ED Discharge Orders     None         Ernie Avena, MD 10/17/22 1459

## 2022-10-17 NOTE — ED Notes (Signed)
Mallory Klein at [facility] will send transport for Bed Ready at [facility] 3E [id number](NS)

## 2022-10-17 NOTE — Assessment & Plan Note (Signed)
Anemia panel has been ordered including LDH haptoglobin and electrophoresis

## 2022-10-17 NOTE — Assessment & Plan Note (Signed)
White cell count of 19,000.  Clinically does not look infected.  However we will follow-up the differential tomorrow.  For thoroughness of workup I will do a blood culture.  Seems to be worsening over the last 1 month.

## 2022-10-18 ENCOUNTER — Encounter (HOSPITAL_COMMUNITY): Payer: Self-pay | Admitting: Internal Medicine

## 2022-10-18 DIAGNOSIS — K250 Acute gastric ulcer with hemorrhage: Secondary | ICD-10-CM | POA: Diagnosis not present

## 2022-10-18 DIAGNOSIS — D62 Acute posthemorrhagic anemia: Secondary | ICD-10-CM | POA: Diagnosis present

## 2022-10-18 DIAGNOSIS — K2210 Ulcer of esophagus without bleeding: Secondary | ICD-10-CM | POA: Diagnosis not present

## 2022-10-18 DIAGNOSIS — E440 Moderate protein-calorie malnutrition: Secondary | ICD-10-CM | POA: Diagnosis present

## 2022-10-18 DIAGNOSIS — K259 Gastric ulcer, unspecified as acute or chronic, without hemorrhage or perforation: Secondary | ICD-10-CM | POA: Diagnosis not present

## 2022-10-18 DIAGNOSIS — Z6826 Body mass index (BMI) 26.0-26.9, adult: Secondary | ICD-10-CM | POA: Diagnosis not present

## 2022-10-18 DIAGNOSIS — K5900 Constipation, unspecified: Secondary | ICD-10-CM | POA: Diagnosis not present

## 2022-10-18 DIAGNOSIS — R933 Abnormal findings on diagnostic imaging of other parts of digestive tract: Secondary | ICD-10-CM

## 2022-10-18 DIAGNOSIS — K449 Diaphragmatic hernia without obstruction or gangrene: Secondary | ICD-10-CM | POA: Diagnosis not present

## 2022-10-18 DIAGNOSIS — Z96651 Presence of right artificial knee joint: Secondary | ICD-10-CM | POA: Diagnosis present

## 2022-10-18 DIAGNOSIS — G20A1 Parkinson's disease without dyskinesia, without mention of fluctuations: Secondary | ICD-10-CM | POA: Diagnosis not present

## 2022-10-18 DIAGNOSIS — G4733 Obstructive sleep apnea (adult) (pediatric): Secondary | ICD-10-CM | POA: Diagnosis present

## 2022-10-18 DIAGNOSIS — E871 Hypo-osmolality and hyponatremia: Secondary | ICD-10-CM | POA: Diagnosis present

## 2022-10-18 DIAGNOSIS — R1010 Upper abdominal pain, unspecified: Secondary | ICD-10-CM | POA: Diagnosis not present

## 2022-10-18 DIAGNOSIS — D649 Anemia, unspecified: Secondary | ICD-10-CM

## 2022-10-18 DIAGNOSIS — D72829 Elevated white blood cell count, unspecified: Secondary | ICD-10-CM | POA: Diagnosis not present

## 2022-10-18 DIAGNOSIS — Z1152 Encounter for screening for COVID-19: Secondary | ICD-10-CM | POA: Diagnosis not present

## 2022-10-18 DIAGNOSIS — R0789 Other chest pain: Secondary | ICD-10-CM | POA: Diagnosis present

## 2022-10-18 DIAGNOSIS — R1013 Epigastric pain: Secondary | ICD-10-CM | POA: Diagnosis not present

## 2022-10-18 DIAGNOSIS — K254 Chronic or unspecified gastric ulcer with hemorrhage: Secondary | ICD-10-CM | POA: Diagnosis present

## 2022-10-18 DIAGNOSIS — N3281 Overactive bladder: Secondary | ICD-10-CM | POA: Diagnosis present

## 2022-10-18 DIAGNOSIS — R109 Unspecified abdominal pain: Secondary | ICD-10-CM | POA: Diagnosis not present

## 2022-10-18 DIAGNOSIS — E785 Hyperlipidemia, unspecified: Secondary | ICD-10-CM | POA: Diagnosis present

## 2022-10-18 DIAGNOSIS — K222 Esophageal obstruction: Secondary | ICD-10-CM | POA: Diagnosis not present

## 2022-10-18 DIAGNOSIS — R634 Abnormal weight loss: Secondary | ICD-10-CM | POA: Diagnosis not present

## 2022-10-18 DIAGNOSIS — F419 Anxiety disorder, unspecified: Secondary | ICD-10-CM | POA: Diagnosis present

## 2022-10-18 DIAGNOSIS — K208 Other esophagitis without bleeding: Secondary | ICD-10-CM | POA: Diagnosis not present

## 2022-10-18 DIAGNOSIS — K219 Gastro-esophageal reflux disease without esophagitis: Secondary | ICD-10-CM | POA: Diagnosis present

## 2022-10-18 DIAGNOSIS — D500 Iron deficiency anemia secondary to blood loss (chronic): Secondary | ICD-10-CM | POA: Diagnosis not present

## 2022-10-18 DIAGNOSIS — G8929 Other chronic pain: Secondary | ICD-10-CM | POA: Diagnosis not present

## 2022-10-18 DIAGNOSIS — E876 Hypokalemia: Secondary | ICD-10-CM | POA: Diagnosis not present

## 2022-10-18 DIAGNOSIS — F32A Depression, unspecified: Secondary | ICD-10-CM | POA: Diagnosis present

## 2022-10-18 DIAGNOSIS — K311 Adult hypertrophic pyloric stenosis: Secondary | ICD-10-CM | POA: Diagnosis present

## 2022-10-18 DIAGNOSIS — Z79899 Other long term (current) drug therapy: Secondary | ICD-10-CM | POA: Diagnosis not present

## 2022-10-18 DIAGNOSIS — I10 Essential (primary) hypertension: Secondary | ICD-10-CM | POA: Diagnosis present

## 2022-10-18 LAB — URINALYSIS, W/ REFLEX TO CULTURE (INFECTION SUSPECTED)
Bacteria, UA: NONE SEEN
Bilirubin Urine: NEGATIVE
Glucose, UA: NEGATIVE mg/dL
Ketones, ur: NEGATIVE mg/dL
Leukocytes,Ua: NEGATIVE
Nitrite: NEGATIVE
Protein, ur: 30 mg/dL — AB
Specific Gravity, Urine: 1.027 (ref 1.005–1.030)
pH: 5 (ref 5.0–8.0)

## 2022-10-18 LAB — BASIC METABOLIC PANEL
Anion gap: 6 (ref 5–15)
BUN: 18 mg/dL (ref 8–23)
CO2: 27 mmol/L (ref 22–32)
Calcium: 8.4 mg/dL — ABNORMAL LOW (ref 8.9–10.3)
Chloride: 99 mmol/L (ref 98–111)
Creatinine, Ser: 1.1 mg/dL — ABNORMAL HIGH (ref 0.44–1.00)
GFR, Estimated: 52 mL/min — ABNORMAL LOW (ref >60)
Glucose, Bld: 132 mg/dL — ABNORMAL HIGH (ref 70–99)
Potassium: 3.6 mmol/L (ref 3.5–5.1)
Sodium: 132 mmol/L — ABNORMAL LOW (ref 135–145)

## 2022-10-18 LAB — PROTIME-INR
INR: 1.2 (ref 0.8–1.2)
Prothrombin Time: 15.4 s — ABNORMAL HIGH (ref 11.4–15.2)

## 2022-10-18 LAB — CBC
HCT: 31.1 % — ABNORMAL LOW (ref 36.0–46.0)
Hemoglobin: 10.1 g/dL — ABNORMAL LOW (ref 12.0–15.0)
MCH: 28.5 pg (ref 26.0–34.0)
MCHC: 32.5 g/dL (ref 30.0–36.0)
MCV: 87.9 fL (ref 80.0–100.0)
Platelets: 372 10*3/uL (ref 150–400)
RBC: 3.54 MIL/uL — ABNORMAL LOW (ref 3.87–5.11)
RDW: 15.6 % — ABNORMAL HIGH (ref 11.5–15.5)
WBC: 17.4 10*3/uL — ABNORMAL HIGH (ref 4.0–10.5)
nRBC: 0 % (ref 0.0–0.2)

## 2022-10-18 LAB — APTT: aPTT: 31 s (ref 24–36)

## 2022-10-18 MED ORDER — ROSUVASTATIN CALCIUM 10 MG PO TABS
10.0000 mg | ORAL_TABLET | Freq: Every day | ORAL | Status: DC
  Administered 2022-10-18 – 2022-10-21 (×4): 10 mg via ORAL
  Filled 2022-10-18 (×4): qty 1

## 2022-10-18 MED ORDER — LORATADINE 10 MG PO TABS
10.0000 mg | ORAL_TABLET | Freq: Every day | ORAL | Status: DC
  Administered 2022-10-19 – 2022-10-21 (×3): 10 mg via ORAL
  Filled 2022-10-18 (×3): qty 1

## 2022-10-18 MED ORDER — HYDROXYZINE PAMOATE 50 MG PO CAPS: 50.0000 mg | ORAL_CAPSULE | Freq: Four times a day (QID) | ORAL | Status: DC | PRN

## 2022-10-18 MED ORDER — QUETIAPINE FUMARATE 50 MG PO TABS
100.0000 mg | ORAL_TABLET | Freq: Every day | ORAL | Status: DC
  Administered 2022-10-18 – 2022-10-20 (×3): 100 mg via ORAL
  Filled 2022-10-18 (×3): qty 2

## 2022-10-18 MED ORDER — FESOTERODINE FUMARATE ER 4 MG PO TB24
4.0000 mg | ORAL_TABLET | Freq: Every day | ORAL | Status: DC
  Administered 2022-10-18 – 2022-10-21 (×4): 4 mg via ORAL
  Filled 2022-10-18 (×4): qty 1

## 2022-10-18 MED ORDER — LACTATED RINGERS IV SOLN: INTRAVENOUS | Status: DC

## 2022-10-18 NOTE — Consult Note (Addendum)
Consultation  Referring Provider:     [facility] Primary Care Physician:  Tresa Garter, MD Primary Gastroenterologist:        Marina Goodell Reason for Consultation:     abdominal pain, CT suggests gastric ulcer     Impression / Plan:   Abnormal stomach on CT-thickening of the antrum and question large proximal gastric ulcer.  Mid or upper abdominal pain and weight loss plus anorexia  Normocytic anemia with ferritin 45, low TIBC and normal saturation with normal folate and B12.  Suspect  of chronic disease anemia.  SPEP LDH and haptoglobin pending.  Constipation and intermittent diarrhea  Parkinson's disease with some decreased cognitive ability/memory disorder  Leukocytosis  -----------------------------------------------------------------------------------------------------  Plan for EGD tomorrow.The risks and benefits as well as alternatives of endoscopic procedure(s) have been discussed and reviewed. All questions answered. The patient agrees to proceed.  Gastric malignancy is certainly possible and would be a unifying diagnosis.   I think bowel habit irregularity is primarily constipation, work on improving bowel regimen.  I think she has some overflow or unloading type issues after not defecating for days.  Consult to dietitian to assess nutritional status and needs  Iva Boop, MD, [facility]astroenter[facility] See Loretha Stapler on call - gastroenterology for best contact person 10/18/2022 9:39 AM         HPI:   Mallory Klein is a 77 y.o. female with a past medical history of Parkinson's disease, mild decreased cognition associated with that, anxiety and depression issues, hypertension, sleep apnea, who has been experiencing upper abdominal pain and constipation alternating with diarrhea problems for several weeks or more.  She is admitted to the hospital after a CT scan of the abdomen pelvis has suggested a large gastric ulcer as well as gastritis.  She believes she has  been losing weight and her appetite is reduced as well.  She describes a mid abdominal pain, occurring intermittently and bothering her quite a bit.  She is unable to elaborate further, she is a vague historian.  There is no dysphagia.  She has had some intermittent nausea and vomiting as recently as a couple of days ago.  Her husband participates in the history.  Stools may be dark at times but it does not sound like melena and there is no hematochezia or rectal bleeding.  She does not use anti-inflammatories or aspirin.  She has had 3 ED visits since July 14.  All 3 times there have been CT scans with contrast. July 14 CT with unremarkable stomach. July 23 mild wall thickening in the distal gastric body and antrum August 24 abnormal wall thickening in the gastric antrum and suspected large cephalad ulcer of the stomach with underlying gastric malignancy not excluded.  She had seen Dr. Posey Rea on July 15 after that first ED visit at which point she had constipation and inadequate defecation for several weeks.  He recommended MiraLAX and Senokot as and to do a MiraLAX purge intermittently or suppository or enema.  She saw him again on July 25 after the 23rd ER visit with similar instructions.  She also complained of left chest pain on presentation and a CT angio chest was performed no pulmonary embolism  Wt Readings from Last 3 Encounters:  09/24/22 62.6 kg  09/15/22 77.1 kg  09/06/22 80.7 kg     Colonoscopy 03/31/2018 - Four 2 to 8 mm polyps at the recto-sigmoid colon (mucosal polyp), in the descending colon and in the ascending colon (adenomas),  removed with a cold snare. Resected and retrieved. - Diverticulosis in the left colon. - Internal hemorrhoids. - The examination was otherwise normal on direct and retroflexion views.  Past Medical History:  Diagnosis Date   Abnormal CBC 02/05/2015   Dr Mosetta Putt 12/16 new: normocytic anemia and thrombocytosis   Adjustment disorder with mixed anxiety  and depressed mood 02/15/2007   Chronic  Chronic pain Grief, stress Effexor XR  Dad died in 03-05-23   Asthma    Diverticulosis of colon (without mention of hemorrhage)    Esophageal stricture    Essential hypertension 01/26/2007   Chronic Verapamil    Falls    "I blackout"   GERD (gastroesophageal reflux disease) 02/15/2007   Chronic     Heart murmur    Hyperglycemia 03/02/2011   Mild     Insomnia disorder 12/08/2018   10/20 Carbid/Lev dose was increased - c/o hard time falling asleep (6 am) getting up at 12 am, poor sleep. Try Temazepam 15-30 mg at 11-1:30 pm    Internal hemorrhoids without mention of complication    Iron deficiency anemia 04/23/2017   Mild neurocognitive disorder, likely due to Parkinson's disease 02/09/2019   OSA (obstructive sleep apnea) 02/14/2010   In lab study (May 2018): AHI 25. Only had 45 minutes of sleep secondary to frequent awakenings secondary to sleep apnea. autocpap 5-15 cm water.    Osteoarthritis    Parkinson disease 03-04-14   2015 2017  Primidone - d/c 2019 Parkinson's: [facility] IR. Dr Tat   Postherpetic trigeminal neuralgia 01/26/2007   Qualifier: Diagnosis of  By: Charlsie Quest RMA, [name]     Shortness of breath dyspnea    with allergies   Thrombocytosis 05/24/2017   Urinary incontinence 01/26/2007   Chronic  10/17 Detrol LA    Vitamin D deficiency 01/26/2019    Past Surgical History:  Procedure Laterality Date   ABDOMINAL HYSTERECTOMY     BREAST CYST EXCISION Left 10/25/1978   COLONOSCOPY     Multiple   LUMBAR LAMINECTOMY/DECOMPRESSION MICRODISCECTOMY Bilateral 02/20/2014   Procedure: LUMBAR TWO THREE, LUMBAR THREE FOUR LUMBAR LAMINECTOMY/DECOMPRESSION MICRODISCECTOMY 2 LEVELS;  Surgeon: Tia Alert, MD;  Location: [facility];  Service: Neurosurgery;  Laterality: Bilateral;   SPLENECTOMY     TIBIA FRACTURE SURGERY Right    TIBIA FRACTURE SURGERY Left    TOTAL KNEE ARTHROPLASTY Right 02/24/2011    Family History  Problem Relation Age of  Onset   Hypertension Mother    Diabetes type II Mother    Asthma Mother    Diabetes Mother    Dementia Mother    Prostate cancer Father    Mental illness Father        dementia   Stroke Brother        died in prison   Suicidality Son    Colon cancer Neg Hx    Rectal cancer Neg Hx    Parkinson's disease Neg Hx    Social History   Tobacco Use   Smoking status: Never   Smokeless tobacco: Never  Vaping Use   Vaping status: Never Used  Substance Use Topics   Alcohol use: Not Currently    Comment: wine on occ    Drug use: No    Prior to Admission medications   Medication Sig Start Date End Date Taking? Authorizing Provider  cetirizine (ZYRTEC) 10 MG tablet TAKE ONE TABLET BY MOUTH EVERY DAY 09/21/22   Plotnikov, Georgina Quint, MD  Cholecalciferol (VITAMIN D3) 50 MCG (2000 UT)  capsule TAKE 1 CAPSULE BY MOUTH EVERY DAY 09/09/21   Plotnikov, Georgina Quint, MD  [employer] SENNA PLUS 8.6-50 MG tablet Take 1 tablet by mouth 2 (two) times daily. 09/26/20   [provider]  cyanocobalamin (VITAMIN B12) 1000 MCG/ML injection INJECT 1 ML INTO THE SKIN EVERY 14 DAYS 10/20/21   Corwin Levins, MD  diclofenac Sodium (VOLTAREN) 1 % GEL Apply 4 g topically daily as needed (pain). 04/25/13   [provider]  docusate sodium (COLACE) 100 MG capsule Take 100 mg by mouth at bedtime. 06/26/19   [provider]  hydrOXYzine (VISTARIL) 50 MG capsule Take 1-2 capsules (50-100 mg total) by mouth every 6 (six) hours as needed for anxiety. 10/03/21   Corwin Levins, MD  pantoprazole (PROTONIX) 40 MG tablet Take 1 tablet (40 mg total) by mouth 2 (two) times daily. 09/17/22   Plotnikov, Georgina Quint, MD  polyethylene glycol (MIRALAX / GLYCOLAX) 17 g packet Take 17 g by mouth daily as needed for mild constipation.    [provider]  Pramipexole Dihydrochloride 0.75 MG TB24 Take 1 tablet (0.75 mg total) by mouth in the morning and at bedtime. 06/25/22   [name], [name], NP  QUEtiapine (SEROQUEL) 100 MG tablet  TAKE ONE TABLET BY MOUTH AT BEDTIME 09/16/22   Plotnikov, Georgina Quint, MD  rosuvastatin (CRESTOR) 10 MG tablet TAKE ONE TABLET BY MOUTH EVERY DAY 09/16/22   Plotnikov, Georgina Quint, MD  solifenacin (VESICARE) 5 MG tablet TAKE ONE TABLET BY MOUTH EVERY DAY 09/16/22   Plotnikov, Georgina Quint, MD  donepezil (ARICEPT) 5 MG tablet Take 1 tablet (5 mg total) by mouth at bedtime. Patient not taking: No sig reported 11/27/19 07/23/20  Plotnikov, Georgina Quint, MD  venlafaxine XR (EFFEXOR-XR) 150 MG 24 hr capsule TAKE 1 CAPSULE BY MOUTH  EVERY DAY WITH BREAKFAST Patient not taking: Reported on 07/21/2020 05/29/19 07/23/20  Plotnikov, Georgina Quint, MD    Current Facility-Administered Medications  Medication Dose Route Frequency Provider Last Rate Last Admin   acetaminophen (TYLENOL) tablet 650 mg  650 mg Oral Q6H PRN Nolberto Hanlon, MD       Or   acetaminophen (TYLENOL) suppository 650 mg  650 mg Rectal Q6H PRN Nolberto Hanlon, MD       dextrose 5 % and 0.45 % NaCl with KCl 10 mEq/L infusion   Intravenous Continuous Nolberto Hanlon, MD 75 mL/hr at 10/17/22 1835 New Bag at 10/17/22 1835   fesoterodine (TOVIAZ) tablet 4 mg  4 mg Oral Daily Regalado, Belkys A, MD       hydrOXYzine (VISTARIL) capsule 50-100 mg  50-100 mg Oral Q6H PRN Regalado, Belkys A, MD       loratadine (CLARITIN) tablet 10 mg  10 mg Oral Daily Regalado, Belkys A, MD       pantoprazole (PROTONIX) injection 40 mg  40 mg Intravenous Conchita Paris, MD   40 mg at 10/17/22 1836   polyethylene glycol (MIRALAX / GLYCOLAX) packet 17 g  17 g Oral Daily PRN Nolberto Hanlon, MD       pramipexole (MIRAPEX) tablet 0.5 mg  0.5 mg Oral TID Nolberto Hanlon, MD   0.5 mg at 10/17/22 2217   QUEtiapine (SEROQUEL) tablet 100 mg  100 mg Oral QHS Regalado, Belkys A, MD       rosuvastatin (CRESTOR) tablet 10 mg  10 mg Oral Daily Regalado, Belkys A, MD       sodium chloride flush (NS) 0.9 % injection 3 mL  3  mL Intravenous Q12H Nolberto Hanlon, MD   3 mL at 10/17/22 2219    Allergies as of 10/17/2022 -  Review Complete 10/17/2022  Allergen Reaction Noted   Ace inhibitors     Benicar [olmesartan]  07/28/2018   Aspirin Other (See Comments)    Citalopram hydrobromide Diarrhea 08/06/2008     Review of Systems:    This is positive for those things mentioned in the HPI.  Fatigue. All other review of systems are negative.       Physical Exam:  Vital signs in last 24 hours: Temp:  [98.8 F (37.1 C)-99.4 F (37.4 C)] 98.9 F (37.2 C) (08/25 0550) Pulse Rate:  [72-95] 72 (08/25 0550) Resp:  [10-20] 20 (08/25 0550) BP: (134-170)/(68-120) 134/78 (08/25 0550) SpO2:  [93 %-100 %] 97 % (08/25 0550)    General:  Well-developed, well-nourished and in no acute distress Eyes:  anicteric. ENT:   Mouth and posterior pharynx free of lesions.  Dentition in good repair Neck:   supple w/o thyromegaly or mass.  Lungs: Clear to auscultation bilaterally. Heart:   S1S2, early short 2/6 systolic ejection murmur best in the right upper sternal border Abdomen:  soft, non-tender, no hepatosplenomegaly, hernia, or mass and BS+.  Lymph:  no cervical or supraclavicular adenopathy. Extremities:   no edema  Neuro:  A&O x 3.  She does have some short-term memory issues, mild masked facies there is a pill-rolling tremor of the right upper extremity and she has a tremor in the head Psych:  appropriate mood and  Affect.   Data Reviewed:   LAB RESULTS: Recent Labs    10/17/22 1138 10/18/22 0519  WBC 19.1* 17.4*  HGB 11.7* 10.1*  HCT 35.5* 31.1*  PLT 422* 372   BMET Recent Labs    10/17/22 1138 10/18/22 0519  NA 135 132*  K 3.6 3.6  CL 96* 99  CO2 30 27  GLUCOSE 120* 132*  BUN 21 18  CREATININE 1.07* 1.10*  CALCIUM 9.0 8.4*   LFT Recent Labs    10/17/22 1138  PROT 7.3  ALBUMIN 3.6  AST 16  ALT 7  ALKPHOS 52  BILITOT 0.7   PT/INR Recent Labs    10/18/22 0519  LABPROT 15.4*  INR 1.2   Lab Results  Component Value Date   LIPASE 16 10/17/2022   Lab Results  Component  Value Date   TSH 0.55 06/25/2022    STUDIES: Images reviewed personally CT ABDOMEN PELVIS W CONTRAST  Result Date: 10/17/2022 CLINICAL DATA:  Acute left upper abdominal pain EXAM: CT ABDOMEN AND PELVIS WITH CONTRAST TECHNIQUE: Multidetector CT imaging of the abdomen and pelvis was performed using the standard protocol following bolus administration of intravenous contrast. RADIATION DOSE REDUCTION: This exam was performed according to the departmental dose-optimization program which includes automated exposure control, adjustment of the mA and/or kV according to patient size and/or use of iterative reconstruction technique. CONTRAST:  75mL OMNIPAQUE IOHEXOL 350 MG/ML SOLN COMPARISON:  09/15/2022 FINDINGS: Lower chest: Mild cardiomegaly. Hepatobiliary: Unremarkable Pancreas: Atrophic pancreas with borderline dilatation of the dorsal pancreatic duct. Otherwise unremarkable. Spleen: Absent aside from small regenerative splenic lesions, compatible with splenectomy. Adrenals/Urinary Tract: Benign bilateral renal cysts. No further imaging workup of these lesions is indicated. Adrenal glands unremarkable.  Urinary bladder appears unremarkable. Stomach/Bowel: Abnormal wall thickening in the gastric antrum compatible with gastritis. Suspected large cephalad ulcer of the stomach on image 60 series 5. Underlying gastric malignancy not excluded. Normal appendix.  No dilated bowel.  Vascular/Lymphatic: Atherosclerosis is present, including aortoiliac atherosclerotic disease. Reproductive: Uterus absent. Other: No supplemental non-categorized findings. Musculoskeletal: Moderate degenerative arthropathy of both hips. Lower thoracic and lumbar spondylosis and degenerative disc disease causing multilevel impingement. Previous posterior decompression at L3. IMPRESSION: 1. Abnormal wall thickening in the gastric antrum suggesting potentially severe gastritis. Suspected large cephalad ulcer of the stomach antrum. No current free  intraperitoneal gas. Underlying gastric malignancy not excluded. Follow up endoscopy is recommended. 2. Aortic atherosclerosis. 3. Mild cardiomegaly. 4. Moderate degenerative arthropathy of both hips. 5. Lower thoracic and lumbar spondylosis and degenerative disc disease causing multilevel impingement. Aortic Atherosclerosis (ICD10-I70.0). Electronically Signed   By: Gaylyn Rong M.D.   On: 10/17/2022 13:29   CT Angio Chest PE W and/or Wo Contrast  Result Date: 10/17/2022 CLINICAL DATA:  PE suspected, left chest pain EXAM: CT ANGIOGRAPHY CHEST WITH CONTRAST TECHNIQUE: Multidetector CT imaging of the chest was performed using the standard protocol during bolus administration of intravenous contrast. Multiplanar CT image reconstructions and MIPs were obtained to evaluate the vascular anatomy. RADIATION DOSE REDUCTION: This exam was performed according to the departmental dose-optimization program which includes automated exposure control, adjustment of the mA and/or kV according to patient size and/or use of iterative reconstruction technique. CONTRAST:  75mL OMNIPAQUE IOHEXOL 350 MG/ML SOLN COMPARISON:  None Available. FINDINGS: Cardiovascular: Satisfactory opacification of the pulmonary arteries to the segmental level. No evidence of pulmonary embolism. Cardiomegaly. No pericardial effusion. Aortic atherosclerosis Mediastinum/Nodes: No enlarged mediastinal, hilar, or axillary lymph nodes. Thyroid gland, trachea, and esophagus demonstrate no significant findings. Lungs/Pleura: Trace pleural effusions.  No acute airspace opacity. Upper Abdomen: Please see separately reported examination of the abdomen pelvis Musculoskeletal: No chest wall abnormality. No acute osseous findings. Review of the MIP images confirms the above findings. IMPRESSION: 1. Negative examination for pulmonary embolism. 2. Trace pleural effusions. No acute airspace opacity. 3. Cardiomegaly. Aortic Atherosclerosis (ICD10-I70.0).  Electronically Signed   By: Jearld Lesch M.D.   On: 10/17/2022 13:20   DG Chest Port 1 View  Result Date: 10/17/2022 CLINICAL DATA:  Left-sided chest pain EXAM: PORTABLE CHEST 1 VIEW COMPARISON:  05/12/2016 FINDINGS: Cardiomegaly. Unchanged elevation of the left hemidiaphragm. Both lungs are clear. The visualized skeletal structures are unremarkable. IMPRESSION: Cardiomegaly without acute abnormality of the lungs in AP portable projection. Electronically Signed   By: Jearld Lesch M.D.   On: 10/17/2022 12:18        Thanks   LOS: 0 days   @Zubin Pontillo  Sena Slate, MD, [facility] @  10/18/2022, 9:32 AM

## 2022-10-18 NOTE — Progress Notes (Signed)
PROGRESS NOTE    Mallory Klein  QIO:[id number] DOB: [birth year] DOA: 10/17/2022 PCP: Tresa Garter, MD   Brief Narrative: 77 year old with past medical history significant for depression, esophageal stricture, hypertension, Parkinson disease who presents complaining of epigastric, anterior chest pain for the last 2 months, she present after a severe episode that happened the day of admission.  CT abdomen and pelvis show wall thickening gastric antrum suggesting of potentially severe gastritis.  Suspected large ulcer in the antrum of  stomach.  CT chest angio negative for PE.  Patient admitted for further evaluation.   Assessment & Plan:   Principal Problem:   Gastric ulcer Active Problems:   Peptic ulcer disease   Leukocytosis   Anemia   1-Gastric ulcer:  -Patient presented with abdominal pain, CT abdomen and pelvis suggestive of gastric ulcer. -LFT normal, lipase normal, troponin negative.  -Continue with IV PPI twice daily and -GI consulted -plan for endoscopy tomorrow.   Bowel irregularity:  Diarrhea/constipation;  GI following.   Mild hyponatremia: Monitor.  Hold hydrochlorothiazide.   Leukocytosis: -Follow blood cultures: -CT angio negative for PE and or pneumonia.  Bilateral trace  pleural effusion. -CT abdomen pelvis: negative -Trending down.  -Check UA  Anemia: Iron 60, B 12: 356 LDH: 148 Haptoglobin.   Parkinson's diseases: Continue with pramipexole   Depression, anxiety: On Vistaril as needed.  Resume Seroquel.  Hyperlipidemia: Resume Crestor Overreactive bladder: start Toviaz substitution for Vesicare  Estimated body mass index is 25.65 kg/m as calculated from the following:   Height as of 09/24/22: 5' 1.5" (1.562 m).   Weight as of 09/24/22: 62.6 kg.   DVT prophylaxis: SCD Code Status: full code Family Communication: husband at bedside.  Disposition Plan:  Status is: Observation The patient will require care spanning > 2 midnights  and should be moved to inpatient because: management of abdominal pain    Consultants:  GI  Procedures:    Antimicrobials:    Subjective: She report loose stool, black stool.  Report epigastric pain for one month worse day of admission   Objective: Vitals:   10/17/22 1736 10/17/22 2141 10/18/22 0135 10/18/22 0550  BP: (!) 159/91 (!) 162/91 137/68 134/78  Pulse: 81 72 75 72  Resp:  20 20 20   Temp: 99 F (37.2 C) 98.8 F (37.1 C) 99.3 F (37.4 C) 98.9 F (37.2 C)  TempSrc: Oral Oral Oral Oral  SpO2: 100% 99% 97% 97%    Intake/Output Summary (Last 24 hours) at 10/18/2022 0715 Last data filed at 10/18/2022 0550 Gross per 24 hour  Intake 399.69 ml  Output 400 ml  Net -0.31 ml   There were no vitals filed for this visit.  Examination:  General exam: Appears calm and comfortable  Respiratory system: Clear to auscultation. Respiratory effort normal. Cardiovascular system: S1 & S2 heard, RRR.  Gastrointestinal system: Abdomen is nondistended, soft and nontender. No organomegaly or masses felt. Normal bowel sounds heard. Central nervous system: Alert and oriented.  Extremities: Symmetric 5 x 5 power.   Data Reviewed: I have personally reviewed following labs and imaging studies  CBC: Recent Labs  Lab 10/17/22 1138 10/18/22 0519  WBC 19.1* 17.4*  HGB 11.7* 10.1*  HCT 35.5* 31.1*  MCV 87.7 87.9  PLT 422* 372   Basic Metabolic Panel: Recent Labs  Lab 10/17/22 1138 10/18/22 0519  NA 135 132*  K 3.6 3.6  CL 96* 99  CO2 30 27  GLUCOSE 120* 132*  BUN 21 18  CREATININE 1.07*  1.10*  CALCIUM 9.0 8.4*   GFR: CrCl cannot be calculated (Unknown ideal weight.). Liver Function Tests: Recent Labs  Lab 10/17/22 1138  AST 16  ALT 7  ALKPHOS 52  BILITOT 0.7  PROT 7.3  ALBUMIN 3.6   Recent Labs  Lab 10/17/22 1138  LIPASE 16   No results for input(s): "AMMONIA" in the last 168 hours. Coagulation Profile: Recent Labs  Lab 10/18/22 0519  INR 1.2    Cardiac Enzymes: No results for input(s): "CKTOTAL", "CKMB", "CKMBINDEX", "TROPONINI" in the last 168 hours. BNP (last 3 results) No results for input(s): "PROBNP" in the last 8760 hours. HbA1C: No results for input(s): "HGBA1C" in the last 72 hours. CBG: No results for input(s): "GLUCAP" in the last 168 hours. Lipid Profile: No results for input(s): "CHOL", "HDL", "LDLCALC", "TRIG", "CHOLHDL", "LDLDIRECT" in the last 72 hours. Thyroid Function Tests: No results for input(s): "TSH", "T4TOTAL", "FREET4", "T3FREE", "THYROIDAB" in the last 72 hours. Anemia Panel: Recent Labs    10/17/22 2120  VITAMINB12 356  FOLATE 7.5  FERRITIN 45  TIBC 246*  IRON 60  RETICCTPCT 2.1   Sepsis Labs: No results for input(s): "PROCALCITON", "LATICACIDVEN" in the last 168 hours.  Recent Results (from the past 240 hour(s))  Resp panel by RT-PCR (RSV, Flu A&B, Covid) Anterior Nasal Swab     Status: None   Collection Time: 10/17/22 11:38 AM   Specimen: Anterior Nasal Swab  Result Value Ref Range Status   SARS Coronavirus 2 by RT PCR NEGATIVE NEGATIVE Final    Comment: (NOTE) SARS-CoV-2 target nucleic acids are NOT DETECTED.  The SARS-CoV-2 RNA is generally detectable in upper respiratory specimens during the acute phase of infection. The lowest concentration of SARS-CoV-2 viral copies this assay can detect is 138 copies/mL. A negative result does not preclude SARS-Cov-2 infection and should not be used as the sole basis for treatment or other patient management decisions. A negative result may occur with  improper specimen collection/handling, submission of specimen other than nasopharyngeal swab, presence of viral mutation(s) within the areas targeted by this assay, and inadequate number of viral copies(<138 copies/mL). A negative result must be combined with clinical observations, patient history, and epidemiological information. The expected result is Negative.  Fact Sheet for Patients:   BloggerCourse.com  Fact Sheet for Healthcare Providers:  SeriousBroker.it  This test is no t yet approved or cleared by the [place] FDA and  has been authorized for detection and/or diagnosis of SARS-CoV-2 by FDA under an Emergency Use Authorization (EUA). This EUA will remain  in effect (meaning this test can be used) for the duration of the COVID-19 declaration under Section 564(b)(1) of the Act, 21 U.S.C.section 360bbb-3(b)(1), unless the authorization is terminated  or revoked sooner.       Influenza A by PCR NEGATIVE NEGATIVE Final   Influenza B by PCR NEGATIVE NEGATIVE Final    Comment: (NOTE) The Xpert Xpress SARS-CoV-2/FLU/RSV plus assay is intended as an aid in the diagnosis of influenza from Nasopharyngeal swab specimens and should not be used as a sole basis for treatment. Nasal washings and aspirates are unacceptable for Xpert Xpress SARS-CoV-2/FLU/RSV testing.  Fact Sheet for Patients: BloggerCourse.com  Fact Sheet for Healthcare Providers: SeriousBroker.it  This test is not yet approved or cleared by the [place] FDA and has been authorized for detection and/or diagnosis of SARS-CoV-2 by FDA under an Emergency Use Authorization (EUA). This EUA will remain in effect (meaning this test can be used) for the duration  of the COVID-19 declaration under Section 564(b)(1) of the Act, 21 U.S.C. section 360bbb-3(b)(1), unless the authorization is terminated or revoked.     Resp Syncytial Virus by PCR NEGATIVE NEGATIVE Final    Comment: (NOTE) Fact Sheet for Patients: BloggerCourse.com  Fact Sheet for Healthcare Providers: SeriousBroker.it  This test is not yet approved or cleared by the [place] FDA and has been authorized for detection and/or diagnosis of SARS-CoV-2 by FDA under an Emergency Use  Authorization (EUA). This EUA will remain in effect (meaning this test can be used) for the duration of the COVID-19 declaration under Section 564(b)(1) of the Act, 21 U.S.C. section 360bbb-3(b)(1), unless the authorization is terminated or revoked.  Performed at [facility], [address], [place], [place] [address]          Radiology Studies: CT ABDOMEN PELVIS W CONTRAST  Result Date: 10/17/2022 CLINICAL DATA:  Acute left upper abdominal pain EXAM: CT ABDOMEN AND PELVIS WITH CONTRAST TECHNIQUE: Multidetector CT imaging of the abdomen and pelvis was performed using the standard protocol following bolus administration of intravenous contrast. RADIATION DOSE REDUCTION: This exam was performed according to the departmental dose-optimization program which includes automated exposure control, adjustment of the mA and/or kV according to patient size and/or use of iterative reconstruction technique. CONTRAST:  75mL OMNIPAQUE IOHEXOL 350 MG/ML SOLN COMPARISON:  09/15/2022 FINDINGS: Lower chest: Mild cardiomegaly. Hepatobiliary: Unremarkable Pancreas: Atrophic pancreas with borderline dilatation of the dorsal pancreatic duct. Otherwise unremarkable. Spleen: Absent aside from small regenerative splenic lesions, compatible with splenectomy. Adrenals/Urinary Tract: Benign bilateral renal cysts. No further imaging workup of these lesions is indicated. Adrenal glands unremarkable.  Urinary bladder appears unremarkable. Stomach/Bowel: Abnormal wall thickening in the gastric antrum compatible with gastritis. Suspected large cephalad ulcer of the stomach on image 60 series 5. Underlying gastric malignancy not excluded. Normal appendix.  No dilated bowel. Vascular/Lymphatic: Atherosclerosis is present, including aortoiliac atherosclerotic disease. Reproductive: Uterus absent. Other: No supplemental non-categorized findings. Musculoskeletal: Moderate degenerative arthropathy of both hips. Lower  thoracic and lumbar spondylosis and degenerative disc disease causing multilevel impingement. Previous posterior decompression at L3. IMPRESSION: 1. Abnormal wall thickening in the gastric antrum suggesting potentially severe gastritis. Suspected large cephalad ulcer of the stomach antrum. No current free intraperitoneal gas. Underlying gastric malignancy not excluded. Follow up endoscopy is recommended. 2. Aortic atherosclerosis. 3. Mild cardiomegaly. 4. Moderate degenerative arthropathy of both hips. 5. Lower thoracic and lumbar spondylosis and degenerative disc disease causing multilevel impingement. Aortic Atherosclerosis (ICD10-I70.0). Electronically Signed   By: Gaylyn Rong M.D.   On: 10/17/2022 13:29   CT Angio Chest PE W and/or Wo Contrast  Result Date: 10/17/2022 CLINICAL DATA:  PE suspected, left chest pain EXAM: CT ANGIOGRAPHY CHEST WITH CONTRAST TECHNIQUE: Multidetector CT imaging of the chest was performed using the standard protocol during bolus administration of intravenous contrast. Multiplanar CT image reconstructions and MIPs were obtained to evaluate the vascular anatomy. RADIATION DOSE REDUCTION: This exam was performed according to the departmental dose-optimization program which includes automated exposure control, adjustment of the mA and/or kV according to patient size and/or use of iterative reconstruction technique. CONTRAST:  75mL OMNIPAQUE IOHEXOL 350 MG/ML SOLN COMPARISON:  None Available. FINDINGS: Cardiovascular: Satisfactory opacification of the pulmonary arteries to the segmental level. No evidence of pulmonary embolism. Cardiomegaly. No pericardial effusion. Aortic atherosclerosis Mediastinum/Nodes: No enlarged mediastinal, hilar, or axillary lymph nodes. Thyroid gland, trachea, and esophagus demonstrate no significant findings. Lungs/Pleura: Trace pleural effusions.  No acute airspace opacity. Upper Abdomen: Please  see separately reported examination of the abdomen  pelvis Musculoskeletal: No chest wall abnormality. No acute osseous findings. Review of the MIP images confirms the above findings. IMPRESSION: 1. Negative examination for pulmonary embolism. 2. Trace pleural effusions. No acute airspace opacity. 3. Cardiomegaly. Aortic Atherosclerosis (ICD10-I70.0). Electronically Signed   By: Jearld Lesch M.D.   On: 10/17/2022 13:20   DG Chest Port 1 View  Result Date: 10/17/2022 CLINICAL DATA:  Left-sided chest pain EXAM: PORTABLE CHEST 1 VIEW COMPARISON:  05/12/2016 FINDINGS: Cardiomegaly. Unchanged elevation of the left hemidiaphragm. Both lungs are clear. The visualized skeletal structures are unremarkable. IMPRESSION: Cardiomegaly without acute abnormality of the lungs in AP portable projection. Electronically Signed   By: Jearld Lesch M.D.   On: 10/17/2022 12:18        Scheduled Meds:  pantoprazole (PROTONIX) IV  40 mg Intravenous Q12H   pramipexole  0.5 mg Oral TID   sodium chloride flush  3 mL Intravenous Q12H   Continuous Infusions:  dextrose 5 % and 0.45 % NaCl with KCl 10 mEq/L 75 mL/hr at 10/17/22 1835     LOS: 0 days    Time spent: 35 minutes    Everard Interrante A Jeshawn Melucci, MD [facility]   If 7PM-7AM, please contact night-coverage [email or web address]  10/18/2022, 7:15 AM

## 2022-10-18 NOTE — Plan of Care (Signed)
  Problem: Skin Integrity: Goal: Risk for impaired skin integrity will decrease Outcome: Progressing   

## 2022-10-19 ENCOUNTER — Inpatient Hospital Stay (HOSPITAL_COMMUNITY): Payer: Medicare Other | Admitting: Certified Registered Nurse Anesthetist

## 2022-10-19 ENCOUNTER — Ambulatory Visit: Payer: Medicare Other | Admitting: Family Medicine

## 2022-10-19 ENCOUNTER — Encounter (HOSPITAL_COMMUNITY)
Admission: EM | Disposition: A | Discharge status: Home / Self Care | Payer: Self-pay | Source: Home / Self Care | Attending: Internal Medicine

## 2022-10-19 ENCOUNTER — Encounter (HOSPITAL_COMMUNITY): Payer: Self-pay | Admitting: Internal Medicine

## 2022-10-19 DIAGNOSIS — K250 Acute gastric ulcer with hemorrhage: Secondary | ICD-10-CM

## 2022-10-19 DIAGNOSIS — K449 Diaphragmatic hernia without obstruction or gangrene: Secondary | ICD-10-CM | POA: Diagnosis not present

## 2022-10-19 DIAGNOSIS — K2210 Ulcer of esophagus without bleeding: Secondary | ICD-10-CM

## 2022-10-19 DIAGNOSIS — K222 Esophageal obstruction: Secondary | ICD-10-CM

## 2022-10-19 DIAGNOSIS — K259 Gastric ulcer, unspecified as acute or chronic, without hemorrhage or perforation: Secondary | ICD-10-CM | POA: Diagnosis not present

## 2022-10-19 DIAGNOSIS — R1013 Epigastric pain: Secondary | ICD-10-CM | POA: Diagnosis not present

## 2022-10-19 DIAGNOSIS — R935 Abnormal findings on diagnostic imaging of other abdominal regions, including retroperitoneum: Secondary | ICD-10-CM

## 2022-10-19 HISTORY — PX: BIOPSY: SHX5522

## 2022-10-19 HISTORY — PX: ESOPHAGOGASTRODUODENOSCOPY (EGD) WITH PROPOFOL: SHX5813

## 2022-10-19 LAB — BASIC METABOLIC PANEL
Anion gap: 11 (ref 5–15)
BUN: 12 mg/dL (ref 8–23)
CO2: 26 mmol/L (ref 22–32)
Calcium: 8.6 mg/dL — ABNORMAL LOW (ref 8.9–10.3)
Chloride: 103 mmol/L (ref 98–111)
Creatinine, Ser: 1.05 mg/dL — ABNORMAL HIGH (ref 0.44–1.00)
GFR, Estimated: 55 mL/min — ABNORMAL LOW (ref >60)
Glucose, Bld: 102 mg/dL — ABNORMAL HIGH (ref 70–99)
Potassium: 3.8 mmol/L (ref 3.5–5.1)
Sodium: 140 mmol/L (ref 135–145)

## 2022-10-19 LAB — CBC
HCT: 32.5 % — ABNORMAL LOW (ref 36.0–46.0)
Hemoglobin: 10.1 g/dL — ABNORMAL LOW (ref 12.0–15.0)
MCH: 29.1 pg (ref 26.0–34.0)
MCHC: 31.1 g/dL (ref 30.0–36.0)
MCV: 93.7 fL (ref 80.0–100.0)
Platelets: 369 10*3/uL (ref 150–400)
RBC: 3.47 MIL/uL — ABNORMAL LOW (ref 3.87–5.11)
RDW: 15.8 % — ABNORMAL HIGH (ref 11.5–15.5)
WBC: 11.2 10*3/uL — ABNORMAL HIGH (ref 4.0–10.5)
nRBC: 0 % (ref 0.0–0.2)

## 2022-10-19 SURGERY — ESOPHAGOGASTRODUODENOSCOPY (EGD) WITH PROPOFOL
Anesthesia: Monitor Anesthesia Care

## 2022-10-19 MED ORDER — PHENYLEPHRINE 80 MCG/ML (10ML) SYRINGE FOR IV PUSH (FOR BLOOD PRESSURE SUPPORT)
PREFILLED_SYRINGE | INTRAVENOUS | Status: DC | PRN
  Administered 2022-10-19 (×2): 120 ug via INTRAVENOUS

## 2022-10-19 MED ORDER — PROPOFOL 500 MG/50ML IV EMUL
INTRAVENOUS | Status: DC | PRN
  Administered 2022-10-19: 150 ug/kg/min via INTRAVENOUS

## 2022-10-19 MED ORDER — PROPOFOL 10 MG/ML IV BOLUS
INTRAVENOUS | Status: DC | PRN
  Administered 2022-10-19: 30 mg via INTRAVENOUS

## 2022-10-19 MED ORDER — SUCRALFATE 1 GM/10ML PO SUSP
1.0000 g | Freq: Two times a day (BID) | ORAL | Status: DC
  Administered 2022-10-19 – 2022-10-20 (×2): 1 g via ORAL
  Filled 2022-10-19 (×2): qty 10

## 2022-10-19 MED ORDER — ADULT MULTIVITAMIN W/MINERALS CH
1.0000 | ORAL_TABLET | Freq: Every day | ORAL | Status: DC
  Administered 2022-10-20 – 2022-10-21 (×2): 1 via ORAL
  Filled 2022-10-19 (×2): qty 1

## 2022-10-19 SURGICAL SUPPLY — 15 items

## 2022-10-19 NOTE — Anesthesia Procedure Notes (Signed)
Procedure Name: MAC Date/Time: 10/19/2022 1:10 PM  Performed by: Vanessa Solvang, CRNAPre-anesthesia Checklist: Patient identified, Emergency Drugs available, Suction available and Patient being monitored Patient Re-evaluated:Patient Re-evaluated prior to induction Oxygen Delivery Method: Simple face mask

## 2022-10-19 NOTE — Op Note (Signed)
[facility] Patient Name: Mallory Klein Procedure Date: 10/19/2022 MRN: [id number] Attending MD: [name]. Marina Goodell , MD, [id number] Date of Birth: 1945-05-31 CSN: [id number] Age: 77 Admit Type: Inpatient Procedure:                Upper GI endoscopy biopsies Indications:              Epigastric abdominal pain, Iron deficiency anemia                            secondary to chronic blood loss, Abnormal CT of the                            GI tract Providers:                [name]. Marina Goodell, MD, Adin Hector, RN, Harrington Challenger,                            Technician Referring MD:             [facility] Medicines:                Monitored Anesthesia Care Complications:            No immediate complications. Estimated Blood Loss:     Estimated blood loss: none. Procedure:                Pre-Anesthesia Assessment:                           - Prior to the procedure, a History and Physical                            was performed, and patient medications and                            allergies were reviewed. The patient's tolerance of                            previous anesthesia was also reviewed. The risks                            and benefits of the procedure and the sedation                            options and risks were discussed with the patient.                            All questions were answered, and informed consent                            was obtained. Prior Anticoagulants: The patient has                            taken no anticoagulant or antiplatelet agents. ASA  Grade Assessment: IlI - A patient with mild                            systemic disease. After reviewing the risks and                            benefits, the patient was deemed in satisfactory                            condition to undergo the procedure.                           After obtaining informed consent, the endoscope was                            passed  under direct vision. Throughout the                            procedure, the patient's blood pressure, pulse, and                            oxygen saturations were monitored continuously. The                            [id number]-[id number] ([id number]) Olympus endoscope was introduced                            through the mouth, and advanced to the second part                            of duodenum. The upper GI endoscopy was                            accomplished without difficulty. The patient                            tolerated the procedure well. Scope In: Scope Out: Findings:      The esophagus revealed erosive esophagitis and peptic stricture. Patient.      The stomach revealed a moderate hiatal hernia. There was a giant       circumferential ulceration of the prepyloric stomach, pylorus, which       encroached on the bulb. There was friability and contact oozing. There       was relative outlet obstruction due to edema ERCP. Biopsies around the       ulcer region were taken. The nature of the ulcer could not be       ascertained.      The examined duodenum beyond the initial bulb was normal.      The cardia and gastric fundus were normal on retroflexion. Impression:               1. Erosive esophagitis                           2. Peptic stricture  3. Large prepyloric/pyloric/proximal bulb ulcer as                            described. Biopsies taken. Moderate Sedation:      none Recommendation:           1. Return to the hospital ward                           2. Continue IV PPI. Currently pantoprazole 40 mg IV                            every 12 hours.                           3. Add Carafate slurry 1 g every 12 hours                           4. Clear liquid diet                           5. Monitor clinically. Will need follow-up EGD at                            some point. If the ulcers found to be malignant or                            benign but  nonhealing, she may need surgery.                           Discussed with patient and husband. Procedure Code(s):        --- Professional ---                           [phone], Esophagogastroduodenoscopy, flexible,                            transoral; diagnostic, including collection of                            specimen(s) by brushing or washing, when performed                            (separate procedure) Diagnosis Code(s):        --- Professional ---                           R10.13, Epigastric pain                           D50.0, Iron deficiency anemia secondary to blood                            loss (chronic)                           R93.3, Abnormal findings on diagnostic imaging of  other parts of digestive tract CPT copyright 2022 [employer]. All rights reserved. The codes documented in this report are preliminary and upon coder review may  be revised to meet current compliance requirements. [name]. Marina Goodell, MD 10/19/2022 1:40:10 PM This report has been signed electronically. Number of Addenda: 0

## 2022-10-19 NOTE — Progress Notes (Signed)
PROGRESS NOTE    Mallory Klein  OZH:[id number] DOB: 10-27-1945 DOA: 10/17/2022 PCP: Tresa Garter, MD   Brief Narrative: 77 year old with past medical history significant for depression, esophageal stricture, hypertension, Parkinson disease who presents complaining of epigastric, anterior chest pain for the last 2 months, she present after a severe episode that happened the day of admission.  CT abdomen and pelvis show wall thickening gastric antrum suggesting of potentially severe gastritis.  Suspected large ulcer in the antrum of  stomach.  CT chest angio negative for PE.  Patient admitted for further evaluation.   Assessment & Plan:   Principal Problem:   Gastric ulcer Active Problems:   Peptic ulcer disease   Leukocytosis   Anemia   1-Gastric ulcer:  -Patient presented with abdominal pain, CT abdomen and pelvis suggestive of gastric ulcer. -LFT normal, lipase normal, troponin negative.  -Continue with IV PPI twice daily -GI consulted -Plan for endoscopy today.   Bowel irregularity:  Diarrhea/constipation;  GI following.   Mild hyponatremia: Monitor.  Hold hydrochlorothiazide.   Leukocytosis: -Follow blood cultures: -CT angio negative for PE and or pneumonia.  Bilateral trace  pleural effusion. -CT abdomen pelvis: negative -awaiting labs for the morning.  -UA, 6-10 WBC   Anemia: Iron 60, B 12: 356 LDH: 148 Haptoglobin. pending  Parkinson's diseases: Continue with pramipexole   Depression, anxiety: On  Seroquel.  Hyperlipidemia: Resume Crestor Overreactive bladder: started  Toviaz substitution for Vesicare  Estimated body mass index is 25.29 kg/m as calculated from the following:   Height as of 09/24/22: 5' 1.5" (1.562 m).   Weight as of this encounter: 61.7 kg.   DVT prophylaxis: SCD Code Status: full code Family Communication: husband at bedside.  Disposition Plan:  Status is: Observation The patient will require care spanning > 2  midnights and should be moved to inpatient because: management of abdominal pain    Consultants:  GI  Procedures:    Antimicrobials:    Subjective: Denies worsening abdominal pain.  No BM overnight.   Objective: Vitals:   10/18/22 1309 10/18/22 2129 10/19/22 0528 10/19/22 1041  BP: (!) 129/93 (!) 124/93 108/63   Pulse: (!) 40 92 79   Resp:  19 18   Temp: 98.7 F (37.1 C) 99.1 F (37.3 C) 98.8 F (37.1 C)   TempSrc: Oral Oral Oral   SpO2: 92% 100% 99%   Weight:    61.7 kg    Intake/Output Summary (Last 24 hours) at 10/19/2022 1056 Last data filed at 10/19/2022 1000 Gross per 24 hour  Intake 2127.19 ml  Output 600 ml  Net 1527.19 ml   Filed Weights   10/19/22 1041  Weight: 61.7 kg    Examination:  General exam: NAD Respiratory system: CTA Cardiovascular system: S 1, S 2 RRR Gastrointestinal system: BS present, soft, nt, nd Central nervous system: Alert, oriented.  Extremities: Symmetric power.    Data Reviewed: I have personally reviewed following labs and imaging studies  CBC: Recent Labs  Lab 10/17/22 1138 10/18/22 0519  WBC 19.1* 17.4*  HGB 11.7* 10.1*  HCT 35.5* 31.1*  MCV 87.7 87.9  PLT 422* 372   Basic Metabolic Panel: Recent Labs  Lab 10/17/22 1138 10/18/22 0519  NA 135 132*  K 3.6 3.6  CL 96* 99  CO2 30 27  GLUCOSE 120* 132*  BUN 21 18  CREATININE 1.07* 1.10*  CALCIUM 9.0 8.4*   GFR: Estimated Creatinine Clearance: 37.2 mL/min (A) (by C-G formula based on SCr of  1.1 mg/dL (H)). Liver Function Tests: Recent Labs  Lab 10/17/22 1138  AST 16  ALT 7  ALKPHOS 52  BILITOT 0.7  PROT 7.3  ALBUMIN 3.6   Recent Labs  Lab 10/17/22 1138  LIPASE 16   No results for input(s): "AMMONIA" in the last 168 hours. Coagulation Profile: Recent Labs  Lab 10/18/22 0519  INR 1.2   Cardiac Enzymes: No results for input(s): "CKTOTAL", "CKMB", "CKMBINDEX", "TROPONINI" in the last 168 hours. BNP (last 3 results) No results for  input(s): "PROBNP" in the last 8760 hours. HbA1C: No results for input(s): "HGBA1C" in the last 72 hours. CBG: No results for input(s): "GLUCAP" in the last 168 hours. Lipid Profile: No results for input(s): "CHOL", "HDL", "LDLCALC", "TRIG", "CHOLHDL", "LDLDIRECT" in the last 72 hours. Thyroid Function Tests: No results for input(s): "TSH", "T4TOTAL", "FREET4", "T3FREE", "THYROIDAB" in the last 72 hours. Anemia Panel: Recent Labs    10/17/22 2120  VITAMINB12 356  FOLATE 7.5  FERRITIN 45  TIBC 246*  IRON 60  RETICCTPCT 2.1   Sepsis Labs: No results for input(s): "PROCALCITON", "LATICACIDVEN" in the last 168 hours.  Recent Results (from the past 240 hour(s))  Resp panel by RT-PCR (RSV, Flu A&B, Covid) Anterior Nasal Swab     Status: None   Collection Time: 10/17/22 11:38 AM   Specimen: Anterior Nasal Swab  Result Value Ref Range Status   SARS Coronavirus 2 by RT PCR NEGATIVE NEGATIVE Final    Comment: (NOTE) SARS-CoV-2 target nucleic acids are NOT DETECTED.  The SARS-CoV-2 RNA is generally detectable in upper respiratory specimens during the acute phase of infection. The lowest concentration of SARS-CoV-2 viral copies this assay can detect is 138 copies/mL. A negative result does not preclude SARS-Cov-2 infection and should not be used as the sole basis for treatment or other patient management decisions. A negative result may occur with  improper specimen collection/handling, submission of specimen other than nasopharyngeal swab, presence of viral mutation(s) within the areas targeted by this assay, and inadequate number of viral copies(<138 copies/mL). A negative result must be combined with clinical observations, patient history, and epidemiological information. The expected result is Negative.  Fact Sheet for Patients:  BloggerCourse.com  Fact Sheet for Healthcare Providers:  SeriousBroker.it  This test is no t yet  approved or cleared by the [place] FDA and  has been authorized for detection and/or diagnosis of SARS-CoV-2 by FDA under an Emergency Use Authorization (EUA). This EUA will remain  in effect (meaning this test can be used) for the duration of the COVID-19 declaration under Section 564(b)(1) of the Act, 21 U.S.C.section 360bbb-3(b)(1), unless the authorization is terminated  or revoked sooner.       Influenza A by PCR NEGATIVE NEGATIVE Final   Influenza B by PCR NEGATIVE NEGATIVE Final    Comment: (NOTE) The Xpert Xpress SARS-CoV-2/FLU/RSV plus assay is intended as an aid in the diagnosis of influenza from Nasopharyngeal swab specimens and should not be used as a sole basis for treatment. Nasal washings and aspirates are unacceptable for Xpert Xpress SARS-CoV-2/FLU/RSV testing.  Fact Sheet for Patients: BloggerCourse.com  Fact Sheet for Healthcare Providers: SeriousBroker.it  This test is not yet approved or cleared by the [place] FDA and has been authorized for detection and/or diagnosis of SARS-CoV-2 by FDA under an Emergency Use Authorization (EUA). This EUA will remain in effect (meaning this test can be used) for the duration of the COVID-19 declaration under Section 564(b)(1) of the Act, 21 U.S.C.  section 360bbb-3(b)(1), unless the authorization is terminated or revoked.     Resp Syncytial Virus by PCR NEGATIVE NEGATIVE Final    Comment: (NOTE) Fact Sheet for Patients: BloggerCourse.com  Fact Sheet for Healthcare Providers: SeriousBroker.it  This test is not yet approved or cleared by the [place] FDA and has been authorized for detection and/or diagnosis of SARS-CoV-2 by FDA under an Emergency Use Authorization (EUA). This EUA will remain in effect (meaning this test can be used) for the duration of the COVID-19 declaration under Section 564(b)(1) of  the Act, 21 U.S.C. section 360bbb-3(b)(1), unless the authorization is terminated or revoked.  Performed at [facility], [address] [place], [place] [address]   Culture, blood (Routine X 2) w Reflex to ID Panel     Status: None (Preliminary result)   Collection Time: 10/17/22  9:09 PM   Specimen: BLOOD  Result Value Ref Range Status   Specimen Description   Final    BLOOD BLOOD LEFT ARM AEROBIC BOTTLE ONLY Performed at [facility], [address] [place], [place] [address]    Special Requests   Final    BOTTLES DRAWN AEROBIC ONLY Blood Culture adequate volume Performed at [facility], [address], [place], [place] [address]    Culture   Final    NO GROWTH < 12 HOURS Performed at [facility], [address] [place], [place] [address]    Report Status PENDING  Incomplete  Culture, blood (Routine X 2) w Reflex to ID Panel     Status: None (Preliminary result)   Collection Time: 10/17/22  9:20 PM   Specimen: BLOOD  Result Value Ref Range Status   Specimen Description   Final    BLOOD BLOOD RIGHT HAND AEROBIC BOTTLE ONLY Performed at [facility], [address], [place], [place] [address]    Special Requests   Final    BOTTLES DRAWN AEROBIC ONLY Blood Culture adequate volume Performed at [facility], [address] [place], [place] [address]    Culture   Final    NO GROWTH < 12 HOURS Performed at [facility], [address] [place], [place] [address]    Report Status PENDING  Incomplete         Radiology Studies: CT ABDOMEN PELVIS W CONTRAST  Result Date: 10/17/2022 CLINICAL DATA:  Acute left upper abdominal pain EXAM: CT ABDOMEN AND PELVIS WITH CONTRAST TECHNIQUE: Multidetector CT imaging of the abdomen and pelvis was performed using the standard protocol following bolus administration of intravenous contrast. RADIATION DOSE REDUCTION: This exam  was performed according to the departmental dose-optimization program which includes automated exposure control, adjustment of the mA and/or kV according to patient size and/or use of iterative reconstruction technique. CONTRAST:  75mL OMNIPAQUE IOHEXOL 350 MG/ML SOLN COMPARISON:  09/15/2022 FINDINGS: Lower chest: Mild cardiomegaly. Hepatobiliary: Unremarkable Pancreas: Atrophic pancreas with borderline dilatation of the dorsal pancreatic duct. Otherwise unremarkable. Spleen: Absent aside from small regenerative splenic lesions, compatible with splenectomy. Adrenals/Urinary Tract: Benign bilateral renal cysts. No further imaging workup of these lesions is indicated. Adrenal glands unremarkable.  Urinary bladder appears unremarkable. Stomach/Bowel: Abnormal wall thickening in the gastric antrum compatible with gastritis. Suspected large cephalad ulcer of the stomach on image 60 series 5. Underlying gastric malignancy not excluded. Normal appendix.  No dilated bowel. Vascular/Lymphatic: Atherosclerosis is present, including aortoiliac atherosclerotic disease. Reproductive: Uterus absent. Other: No supplemental non-categorized findings. Musculoskeletal: Moderate degenerative arthropathy  of both hips. Lower thoracic and lumbar spondylosis and degenerative disc disease causing multilevel impingement. Previous posterior decompression at L3. IMPRESSION: 1. Abnormal wall thickening in the gastric antrum suggesting potentially severe gastritis. Suspected large cephalad ulcer of the stomach antrum. No current free intraperitoneal gas. Underlying gastric malignancy not excluded. Follow up endoscopy is recommended. 2. Aortic atherosclerosis. 3. Mild cardiomegaly. 4. Moderate degenerative arthropathy of both hips. 5. Lower thoracic and lumbar spondylosis and degenerative disc disease causing multilevel impingement. Aortic Atherosclerosis (ICD10-I70.0). Electronically Signed   By: Gaylyn Rong M.D.   On: 10/17/2022 13:29    CT Angio Chest PE W and/or Wo Contrast  Result Date: 10/17/2022 CLINICAL DATA:  PE suspected, left chest pain EXAM: CT ANGIOGRAPHY CHEST WITH CONTRAST TECHNIQUE: Multidetector CT imaging of the chest was performed using the standard protocol during bolus administration of intravenous contrast. Multiplanar CT image reconstructions and MIPs were obtained to evaluate the vascular anatomy. RADIATION DOSE REDUCTION: This exam was performed according to the departmental dose-optimization program which includes automated exposure control, adjustment of the mA and/or kV according to patient size and/or use of iterative reconstruction technique. CONTRAST:  75mL OMNIPAQUE IOHEXOL 350 MG/ML SOLN COMPARISON:  None Available. FINDINGS: Cardiovascular: Satisfactory opacification of the pulmonary arteries to the segmental level. No evidence of pulmonary embolism. Cardiomegaly. No pericardial effusion. Aortic atherosclerosis Mediastinum/Nodes: No enlarged mediastinal, hilar, or axillary lymph nodes. Thyroid gland, trachea, and esophagus demonstrate no significant findings. Lungs/Pleura: Trace pleural effusions.  No acute airspace opacity. Upper Abdomen: Please see separately reported examination of the abdomen pelvis Musculoskeletal: No chest wall abnormality. No acute osseous findings. Review of the MIP images confirms the above findings. IMPRESSION: 1. Negative examination for pulmonary embolism. 2. Trace pleural effusions. No acute airspace opacity. 3. Cardiomegaly. Aortic Atherosclerosis (ICD10-I70.0). Electronically Signed   By: Jearld Lesch M.D.   On: 10/17/2022 13:20   DG Chest Port 1 View  Result Date: 10/17/2022 CLINICAL DATA:  Left-sided chest pain EXAM: PORTABLE CHEST 1 VIEW COMPARISON:  05/12/2016 FINDINGS: Cardiomegaly. Unchanged elevation of the left hemidiaphragm. Both lungs are clear. The visualized skeletal structures are unremarkable. IMPRESSION: Cardiomegaly without acute abnormality of the lungs in AP  portable projection. Electronically Signed   By: Jearld Lesch M.D.   On: 10/17/2022 12:18        Scheduled Meds:  fesoterodine  4 mg Oral Daily   loratadine  10 mg Oral Daily   pantoprazole (PROTONIX) IV  40 mg Intravenous Q12H   pramipexole  0.5 mg Oral TID   QUEtiapine  100 mg Oral QHS   rosuvastatin  10 mg Oral Daily   sodium chloride flush  3 mL Intravenous Q12H   Continuous Infusions:  dextrose 5 % and 0.45 % NaCl with KCl 10 mEq/L 75 mL/hr at 10/19/22 1044   lactated ringers       LOS: 1 day    Time spent: 35 minutes    Gracynn Rajewski A Brooklyn Alfredo, MD [facility]   If 7PM-7AM, please contact night-coverage [email or web address]  10/19/2022, 10:56 AM

## 2022-10-19 NOTE — Progress Notes (Addendum)
Patient ID: Mallory Klein, female   DOB: 01-Jan-1946, 77 y.o.   MRN: [id number]    Progress Note   Subjective  Day #2 CC; upper abdominal pain, weight loss, poor appetite and abnormal CT  Labs-INR 1.2 WBC 17.4/hemoglobin 10.1/hematocrit 31.1 BUN 18/creatinine 1.10  Today's labs pending  CT abdomen and pelvis with contrast-abnormal wall thickening in the gastric antrum, suspected large cephalad ulcer of the stomach cannot rule out underlying malignancy, atrophic pancreas  Patient continues to complain of epigastric pain, no nausea or vomiting says she does not have much of an appetite but knows she is having a procedure today   Objective   Vital signs in last 24 hours: Temp:  [98.7 F (37.1 C)-99.1 F (37.3 C)] 98.8 F (37.1 C) (08/26 0528) Pulse Rate:  [40-92] 79 (08/26 0528) Resp:  [18-19] 18 (08/26 0528) BP: (108-129)/(63-93) 108/63 (08/26 0528) SpO2:  [92 %-100 %] 99 % (08/26 0528) Last BM Date : 10/17/22 General:    Elderly African-American female in NAD Heart:  Regular rate and rhythm; no murmurs Lungs: Respirations even and unlabored, lungs CTA bilaterally Abdomen:  Soft, tender in the epigastrium, no guarding or rebound. Normal bowel sounds. Extremities:  Without edema. Neurologic:  Alert and oriented, Parkinson's tremor, she seems confused about where her husband is, and briefly did not remember she was in the hospital Psych:  Cooperative. Normal mood and affect.  Intake/Output from previous day: 08/25 0701 - 08/26 0700 In: 3318.8 [P.O.:1200; I.V.:2118.8] Out: 200 [Urine:200] Intake/Output this shift: Total I/O In: 0  Out: 400 [Urine:400]  Lab Results: Recent Labs    10/17/22 1138 10/18/22 0519  WBC 19.1* 17.4*  HGB 11.7* 10.1*  HCT 35.5* 31.1*  PLT 422* 372   BMET Recent Labs    10/17/22 1138 10/18/22 0519  NA 135 132*  K 3.6 3.6  CL 96* 99  CO2 30 27  GLUCOSE 120* 132*  BUN 21 18  CREATININE 1.07* 1.10*  CALCIUM 9.0 8.4*    LFT Recent Labs    10/17/22 1138  PROT 7.3  ALBUMIN 3.6  AST 16  ALT 7  ALKPHOS 52  BILITOT 0.7   PT/INR Recent Labs    10/18/22 0519  LABPROT 15.4*  INR 1.2    Studies/Results: CT ABDOMEN PELVIS W CONTRAST  Result Date: 10/17/2022 CLINICAL DATA:  Acute left upper abdominal pain EXAM: CT ABDOMEN AND PELVIS WITH CONTRAST TECHNIQUE: Multidetector CT imaging of the abdomen and pelvis was performed using the standard protocol following bolus administration of intravenous contrast. RADIATION DOSE REDUCTION: This exam was performed according to the departmental dose-optimization program which includes automated exposure control, adjustment of the mA and/or kV according to patient size and/or use of iterative reconstruction technique. CONTRAST:  75mL OMNIPAQUE IOHEXOL 350 MG/ML SOLN COMPARISON:  09/15/2022 FINDINGS: Lower chest: Mild cardiomegaly. Hepatobiliary: Unremarkable Pancreas: Atrophic pancreas with borderline dilatation of the dorsal pancreatic duct. Otherwise unremarkable. Spleen: Absent aside from small regenerative splenic lesions, compatible with splenectomy. Adrenals/Urinary Tract: Benign bilateral renal cysts. No further imaging workup of these lesions is indicated. Adrenal glands unremarkable.  Urinary bladder appears unremarkable. Stomach/Bowel: Abnormal wall thickening in the gastric antrum compatible with gastritis. Suspected large cephalad ulcer of the stomach on image 60 series 5. Underlying gastric malignancy not excluded. Normal appendix.  No dilated bowel. Vascular/Lymphatic: Atherosclerosis is present, including aortoiliac atherosclerotic disease. Reproductive: Uterus absent. Other: No supplemental non-categorized findings. Musculoskeletal: Moderate degenerative arthropathy of both hips. Lower thoracic and lumbar spondylosis and degenerative disc disease  causing multilevel impingement. Previous posterior decompression at L3. IMPRESSION: 1. Abnormal wall thickening in the  gastric antrum suggesting potentially severe gastritis. Suspected large cephalad ulcer of the stomach antrum. No current free intraperitoneal gas. Underlying gastric malignancy not excluded. Follow up endoscopy is recommended. 2. Aortic atherosclerosis. 3. Mild cardiomegaly. 4. Moderate degenerative arthropathy of both hips. 5. Lower thoracic and lumbar spondylosis and degenerative disc disease causing multilevel impingement. Aortic Atherosclerosis (ICD10-I70.0). Electronically Signed   By: Gaylyn Rong M.D.   On: 10/17/2022 13:29   CT Angio Chest PE W and/or Wo Contrast  Result Date: 10/17/2022 CLINICAL DATA:  PE suspected, left chest pain EXAM: CT ANGIOGRAPHY CHEST WITH CONTRAST TECHNIQUE: Multidetector CT imaging of the chest was performed using the standard protocol during bolus administration of intravenous contrast. Multiplanar CT image reconstructions and MIPs were obtained to evaluate the vascular anatomy. RADIATION DOSE REDUCTION: This exam was performed according to the departmental dose-optimization program which includes automated exposure control, adjustment of the mA and/or kV according to patient size and/or use of iterative reconstruction technique. CONTRAST:  75mL OMNIPAQUE IOHEXOL 350 MG/ML SOLN COMPARISON:  None Available. FINDINGS: Cardiovascular: Satisfactory opacification of the pulmonary arteries to the segmental level. No evidence of pulmonary embolism. Cardiomegaly. No pericardial effusion. Aortic atherosclerosis Mediastinum/Nodes: No enlarged mediastinal, hilar, or axillary lymph nodes. Thyroid gland, trachea, and esophagus demonstrate no significant findings. Lungs/Pleura: Trace pleural effusions.  No acute airspace opacity. Upper Abdomen: Please see separately reported examination of the abdomen pelvis Musculoskeletal: No chest wall abnormality. No acute osseous findings. Review of the MIP images confirms the above findings. IMPRESSION: 1. Negative examination for pulmonary  embolism. 2. Trace pleural effusions. No acute airspace opacity. 3. Cardiomegaly. Aortic Atherosclerosis (ICD10-I70.0). Electronically Signed   By: Jearld Lesch M.D.   On: 10/17/2022 13:20   DG Chest Port 1 View  Result Date: 10/17/2022 CLINICAL DATA:  Left-sided chest pain EXAM: PORTABLE CHEST 1 VIEW COMPARISON:  05/12/2016 FINDINGS: Cardiomegaly. Unchanged elevation of the left hemidiaphragm. Both lungs are clear. The visualized skeletal structures are unremarkable. IMPRESSION: Cardiomegaly without acute abnormality of the lungs in AP portable projection. Electronically Signed   By: Jearld Lesch M.D.   On: 10/17/2022 12:18       Assessment / Plan:    #68 77 year old African-American female presenting with 1 month history of epigastric pain, anorexia and weight loss so reports intermittent dark stools  CT imaging on admission concerning for large gastric ulcer cannot rule out malignancy  #2 anemia-May be more consistent with anemia of chronic disease, by history she may have had some low-grade intermittent bleeding.  #3 Parkinson's with some decrease in cognitive ability #4 leukocytosis  Plan; patient is scheduled for EGD this afternoon with Dr. Marina Goodell Continue to trend hemoglobin Further recommendations pending results of EGD     Principal Problem:   Gastric ulcer Active Problems:   Peptic ulcer disease   Leukocytosis   Anemia     LOS: 1 day   [name] Esterwood PA-C 10/19/2022, 9:45 AM  GI ATTENDING  Interval history and data reviewed.  Imaging reviewed.  Agree with interval progress note.  For upper endoscopy this afternoon.The nature of the procedure, as well as the risks, benefits, and alternatives were carefully and thoroughly reviewed with the patient. Ample time for discussion and questions allowed. The patient understood, was satisfied, and agreed to proceed.  [name]. Eda Keys., M.D. [facility] Division of Gastroenterology

## 2022-10-19 NOTE — Anesthesia Postprocedure Evaluation (Signed)
Anesthesia Post Note  Patient: Mallory Klein  Procedure(s) Performed: ESOPHAGOGASTRODUODENOSCOPY (EGD) WITH PROPOFOL BIOPSY     Patient location during evaluation: Endoscopy Anesthesia Type: MAC Level of consciousness: awake Pain management: pain level controlled Vital Signs Assessment: post-procedure vital signs reviewed and stable Respiratory status: spontaneous breathing Cardiovascular status: stable Postop Assessment: no apparent nausea or vomiting Anesthetic complications: no  No notable events documented.  Last Vitals:  Vitals:   10/19/22 1343 10/19/22 1350  BP: (!) 89/66 109/66  Pulse: 97 100  Resp: 17 16  Temp:    SpO2: 98% 100%    Last Pain:  Vitals:   10/19/22 1350  TempSrc:   PainSc: 0-No pain                 Caren Macadam

## 2022-10-19 NOTE — Plan of Care (Signed)
  Problem: Education: Goal: Knowledge of General Education information will improve Description: Including pain rating scale, medication(s)/side effects and non-pharmacologic comfort measures Outcome: Progressing   Problem: Safety: Goal: Ability to remain free from injury will improve Outcome: Progressing   Problem: Education: Goal: Knowledge of General Education information will improve Description: Including pain rating scale, medication(s)/side effects and non-pharmacologic comfort measures Outcome: Progressing   Problem: Safety: Goal: Ability to remain free from injury will improve Outcome: Progressing

## 2022-10-19 NOTE — Progress Notes (Signed)
   10/19/22 1103  TOC Brief Assessment  Insurance and Status Reviewed  Patient has primary care physician Yes  Home environment has been reviewed home with spouse  Prior level of function: independent  Prior/Current Home Services No current home services  Social Determinants of Health Reivew SDOH reviewed no interventions necessary  Readmission risk has been reviewed Yes  Transition of care needs no transition of care needs at this time

## 2022-10-19 NOTE — Transfer of Care (Signed)
Immediate Anesthesia Transfer of Care Note  Patient: Mallory Klein  Procedure(s) Performed: ESOPHAGOGASTRODUODENOSCOPY (EGD) WITH PROPOFOL BIOPSY  Patient Location: Endoscopy Unit  Anesthesia Type:MAC  Level of Consciousness: drowsy  Airway & Oxygen Therapy: Patient Spontanous Breathing and Patient connected to face mask  Post-op Assessment: Report given to RN and Post -op Vital signs reviewed and stable  Post vital signs: Reviewed and stable  Last Vitals:  Vitals Value Taken Time  BP    Temp    Pulse 91 10/19/22 1335  Resp 9 10/19/22 1335  SpO2 100 % 10/19/22 1335  Vitals shown include unfiled device data.  Last Pain:  Vitals:   10/19/22 1234  TempSrc: Temporal  PainSc: 0-No pain         Complications: No notable events documented.

## 2022-10-19 NOTE — Progress Notes (Signed)
Initial Nutrition Assessment  DOCUMENTATION CODES:   Non-severe (moderate) malnutrition in context of acute illness/injury  INTERVENTION:  - Clear Liquid diet per MD.  - Once diet advanced further, recommend Ensure Plus High Protein po BID, each supplement provides 350 kcal and 20 grams of protein. - Multivitamin with minerals daily - Monitor weight trends.   NUTRITION DIAGNOSIS:   Moderate Malnutrition related to acute illness as evidenced by mild fat depletion, mild muscle depletion, percent weight loss (10.5% within the past 4 months).  GOAL:   Patient will meet greater than or equal to 90% of their needs  MONITOR:   PO intake, Supplement acceptance, Diet advancement, Weight trends  REASON FOR ASSESSMENT:   Consult Assessment of nutrition requirement/status  ASSESSMENT:   77 year old with PMH significant for depression, esophageal stricture, HTN, Parkinson disease who presented complaining of epigastric, anterior chest pain for the last 2 months. CT abdomen and pelvis show wall thickening gastric antrum suggesting of potentially severe gastritis and suspected large ulcer in the antrum of stomach with potential for malignancy.   Patient reports a UBW of 159# and weight loss over the past 6 weeks due to decreased appetite and intake.  Per EMR, last two weights taken in July were from the ED and noted to be stated weights. Patient weighed at 152# during office visit on 5/2 and she is now weighed at 136#. This would be a 16# or 10.5% weight loss within the past 4 months, which is significant.   Patient reports typically eating 3 small meals a day at home. Has cereal for breakfast and two slightly larger meals for dinner.  Over the past 6 weeks her abdominal pain has continued to worsen and her appetite has decreased. Recently, she has been skipping meals during the day and only having around 1 meal a day.   Patient NPO during visit for planned EGD. She is agreeable to try Ensure  once diet advanced. Discussed trying to order something at each meal and having small and frequent meals/snacks in addition to Ensure to increase intake and stimulate appetite.    Medications reviewed and include: -  Labs reviewed:  Na 132 Creatinine 1.10   NUTRITION - FOCUSED PHYSICAL EXAM:  Flowsheet Row Most Recent Value  Orbital Region Mild depletion  Upper Arm Region Mild depletion  Thoracic and Lumbar Region No depletion  Buccal Region No depletion  Temple Region Moderate depletion  Clavicle Bone Region Mild depletion  Clavicle and Acromion Bone Region Mild depletion  Scapular Bone Region Unable to assess  Dorsal Hand No depletion  Patellar Region No depletion  Anterior Thigh Region No depletion  Posterior Calf Region No depletion  Edema (RD Assessment) Mild  Hair Reviewed  Eyes Reviewed  Mouth Reviewed  Skin Reviewed  Nails Reviewed       Diet Order:   Diet Order             Diet clear liquid Room service appropriate? Yes; Fluid consistency: Thin  Diet effective now                   EDUCATION NEEDS:  Education needs have been addressed  Skin:  Skin Assessment: Reviewed RN Assessment  Last BM:  8/24  Height:  Ht Readings from Last 1 Encounters:  10/19/22 5' (1.524 m)   Weight:  Wt Readings from Last 1 Encounters:  10/19/22 61.7 kg   BMI:  Body mass index is 26.57 kg/m.  Estimated Nutritional Needs:  Kcal:  8657-8469  kcals Protein:  70-80 grams Fluid:  >/= 1.7L    Shelle Iron RD, LDN For contact information, refer to [facility].

## 2022-10-19 NOTE — Anesthesia Preprocedure Evaluation (Addendum)
Anesthesia Evaluation  Patient identified by MRN, date of birth, ID band Patient awake    Reviewed: Allergy & Precautions, NPO status , Patient's Chart, lab work & pertinent test results  Airway Mallampati: I       Dental no notable dental hx.    Pulmonary    Pulmonary exam normal        Cardiovascular hypertension, Normal cardiovascular exam     Neuro/Psych  PSYCHIATRIC DISORDERS Anxiety        GI/Hepatic ,GERD  Medicated,,  Endo/Other    Renal/GU      Musculoskeletal   Abdominal Normal abdominal exam  (+)   Peds  Hematology   Anesthesia Other Findings   Reproductive/Obstetrics                             Anesthesia Physical Anesthesia Plan  ASA: 2  Anesthesia Plan: MAC   Post-op Pain Management:    Induction:   PONV Risk Score and Plan: Propofol infusion  Airway Management Planned: Natural Airway and Mask  Additional Equipment: None  Intra-op Plan:   Post-operative Plan:   Informed Consent: I have reviewed the patients History and Physical, chart, labs and discussed the procedure including the risks, benefits and alternatives for the proposed anesthesia with the patient or authorized representative who has indicated his/her understanding and acceptance.       Plan Discussed with: CRNA  Anesthesia Plan Comments:        Anesthesia Quick Evaluation

## 2022-10-20 ENCOUNTER — Encounter (HOSPITAL_COMMUNITY): Payer: Self-pay | Admitting: Internal Medicine

## 2022-10-20 DIAGNOSIS — E440 Moderate protein-calorie malnutrition: Secondary | ICD-10-CM | POA: Insufficient documentation

## 2022-10-20 DIAGNOSIS — K250 Acute gastric ulcer with hemorrhage: Secondary | ICD-10-CM | POA: Diagnosis not present

## 2022-10-20 DIAGNOSIS — R1013 Epigastric pain: Secondary | ICD-10-CM | POA: Diagnosis not present

## 2022-10-20 LAB — HAPTOGLOBIN: Haptoglobin: 134 mg/dL (ref 42–346)

## 2022-10-20 MED ORDER — SUCRALFATE 1 GM/10ML PO SUSP
1.0000 g | Freq: Three times a day (TID) | ORAL | Status: DC
  Administered 2022-10-20 – 2022-10-21 (×4): 1 g via ORAL
  Filled 2022-10-20 (×4): qty 10

## 2022-10-20 MED ORDER — ENSURE ENLIVE PO LIQD
237.0000 mL | Freq: Two times a day (BID) | ORAL | Status: DC
  Administered 2022-10-20 – 2022-10-21 (×3): 237 mL via ORAL

## 2022-10-20 NOTE — Progress Notes (Addendum)
Patient ID: Mallory Klein, female   DOB: 09-09-1945, 77 y.o.   MRN: [id number]    Progress Note   Subjective   Day # 4 CC; Epigastric pain  EGD-10/19/2022-moderate hiatal hernia, giant circumferential ulceration of the prepyloric stomach pylorus encroaching on the bulb, there was friability and contact oozing and relative outlet obstruction due to edema  Pyloric Bx-scant fragment of pyloroduodenal junction mucosa with reactive/reparative changes in background of abundant fragments of fibropurulent debris consistent with ulceration underlying morphologic etiology for the ulceration is not identified on limited material  No new labs today  Patient is sitting up in the chair, no nausea or vomiting but still not feeling like eating much no complaints of any significant pain, just aware of discomfort in her upper abdomen. She is asking whether there was anything that she was doing at home that caused this ulcer-I do think the Celebrex on her med list and it looks like that had been filled in May so suspect this may have been the etiology, though she is not aware that she had been taking any anti-inflammatory   Objective   Vital signs in last 24 hours: Temp:  [97.4 F (36.3 C)-98.8 F (37.1 C)] 98.7 F (37.1 C) (08/27 0623) Pulse Rate:  [80-107] 80 (08/27 0623) Resp:  [9-21] 18 (08/27 0623) BP: (88-149)/(50-85) 134/84 (08/27 0623) SpO2:  [91 %-100 %] 98 % (08/27 8756) Weight:  [61.7 kg] 61.7 kg (08/26 1234) Last BM Date : 10/17/22 General:   Very pleasant elderly African-American female in NAD Heart:  Regular rate and rhythm; no murmurs Lungs: Respirations even and unlabored, lungs CTA bilaterally Abdomen:  Soft, minimally tender in the epigastrium  and nondistended. Normal bowel sounds. Extremities:  Without edema. Neurologic:  Alert and oriented, Parkinson's tremor Psych:  Cooperative. Normal mood and affect.  Intake/Output from previous day: 08/26 0701 - 08/27 0700 In: 1020  [P.O.:720; I.V.:300] Out: 400 [Urine:400] Intake/Output this shift: Total I/O In: 240 [P.O.:240] Out: -   Lab Results: Recent Labs    10/17/22 1138 10/18/22 0519 10/19/22 1129  WBC 19.1* 17.4* 11.2*  HGB 11.7* 10.1* 10.1*  HCT 35.5* 31.1* 32.5*  PLT 422* 372 369   BMET Recent Labs    10/17/22 1138 10/18/22 0519 10/19/22 1129  NA 135 132* 140  K 3.6 3.6 3.8  CL 96* 99 103  CO2 30 27 26   GLUCOSE 120* 132* 102*  BUN 21 18 12   CREATININE 1.07* 1.10* 1.05*  CALCIUM 9.0 8.4* 8.6*   LFT Recent Labs    10/17/22 1138  PROT 7.3  ALBUMIN 3.6  AST 16  ALT 7  ALKPHOS 52  BILITOT 0.7   PT/INR Recent Labs    10/18/22 0519  LABPROT 15.4*  INR 1.2         Assessment / Plan:    #43 77 year old African-American female with Parkinson's disease, admitted with upper abdominal pain, weight loss poor appetite and abnormal CT.  EGD yesterday with finding of a giant circumferential pre-pyloric stomach, and pylorus with contact oozing and friability, relative outlet obstruction due to edema  Biopsies were taken, these do not reveal any evidence of malignancy, though scant specimen per pathology  Patient is stable, no active bleeding, no vomiting, tolerating clear liquids  Reviewing her outpatient med list suspect she had been taking Celebrex at home not realizing this was an anti-inflammatory.  #2 anemia-suspect she has had some intermittent low-grade bleeding prior to admission, no iron deficiency  #3 Parkinson's disease with  mild decrease in cognitive ability  Plan; continue IV PPI today Carafate 1gm between meals and at bedtime Advance to full liquid diet Repeat CBC in a.m. Discontinue all aspirin and NSAIDs  Will reassess in a.m., if tolerates full liquids, then can advance to soft diet and she may be able to be discharged tomorrow. She will need repeat EGD as an outpatient in 6 to 8 weeks.   Principal Problem:   Gastric ulcer Active Problems:   Peptic  ulcer disease   Leukocytosis   Anemia   Abnormal CT of the abdomen   Malnutrition of moderate degree     LOS: 2 days   [name] EsterwoodPA-C  10/20/2022, 10:58 AM  GI ATTENDING  Interval history data reviewed.  Patient seen and examined.  Agree with interval progress note as outlined above.  The biopsy is negative, cannot exclude malignancy.  Agree with aggressive medical therapy as outlined above.  Agree with advancing to full liquid diet.  Agree that she will need close outpatient follow-up.  [name]. Eda Keys., M.D. [facility] Division of Gastroenterology

## 2022-10-20 NOTE — Plan of Care (Signed)
  Problem: Education: Goal: Knowledge of General Education information will improve Description: Including pain rating scale, medication(s)/side effects and non-pharmacologic comfort measures Outcome: Progressing   Problem: Pain Managment: Goal: General experience of comfort will improve Outcome: Progressing   

## 2022-10-20 NOTE — Progress Notes (Signed)
Mobility Specialist - Progress Note   10/20/22 0919  Mobility  Activity Ambulated with assistance in hallway  Level of Assistance Contact guard assist, steadying assist  Assistive Device Front wheel walker  Distance Ambulated (ft) 40 ft  Activity Response Tolerated well  Mobility Referral Yes  $Mobility charge 1 Mobility  Mobility Specialist Start Time (ACUTE ONLY) [id number]  Mobility Specialist Stop Time (ACUTE ONLY) 0916  Mobility Specialist Time Calculation (min) (ACUTE ONLY) 12 min   Pt received in recliner and agreeable to mobility. Pt was minA from STS & CG during ambulation due to unsteadiness. No complaints during session. Pt to recliner after session with all needs met.     [facility]

## 2022-10-20 NOTE — Progress Notes (Signed)
PROGRESS NOTE    Mallory Klein  TKZ:[id number] DOB: [birth year] DOA: 10/17/2022 PCP: Tresa Garter, MD   Brief Narrative: 77 year old with past medical history significant for depression, esophageal stricture, hypertension, Parkinson disease who presents complaining of epigastric, anterior chest pain for the last 2 months, she present after a severe episode of abdominal pain that happened the day of admission.  CT abdomen and pelvis show wall thickening gastric antrum suggesting of potentially severe gastritis.  Suspected large ulcer in the antrum of  stomach.  CT chest angio negative for PE.  Patient admitted for further evaluation.  Underwent endoscopy 8/26 showed; erosive esophagitis, peptic stricture,  Large prepyloric/pyloric/proximal bulb ulcer. Gi recommend IV PPI, Carafate, advanced diet today to full liquid diet. Patient will need repeat endoscopy in 6-8  weeks. Biopsy unrevealing.   Assessment & Plan:   Principal Problem:   Gastric ulcer Active Problems:   Peptic ulcer disease   Leukocytosis   Anemia   Abnormal CT of the abdomen   Malnutrition of moderate degree   1-Gastric ulcer:  -Patient presented with abdominal pain, CT abdomen and pelvis suggestive of gastric ulcer. -LFT normal, lipase normal, troponin negative.  -Continue with IV PPI twice daily, started on Carafate.  -GI consulted. Underwent endoscopy 8/26: erosive esophagitis, peptic stricture,  Large prepyloric/pyloric/proximal bulb ulcer. -Plan to continue with full liquid diet, she will need repeat endoscopy in 6-8 weeks. Biopsy unrevealing.   Bowel irregularity:  Diarrhea/constipation;  GI following.   Mild hyponatremia: Monitor.  Hold hydrochlorothiazide.   Leukocytosis: -Follow blood cultures: -CT angio negative for PE and or pneumonia.  Bilateral trace  pleural effusion. -CT abdomen pelvis: negative -UA, 6-10 WBC  Decreasing down to 11.   Anemia: Iron 60, B 12: 356 LDH:  148 Haptoglobin. Normal.   Parkinson's diseases: Continue with pramipexole  Depression, anxiety: Continue with  Seroquel.  Hyperlipidemia: Continue with Crestor Overreactive bladder: started  Toviaz substitution for Vesicare  Estimated body mass index is 26.57 kg/m as calculated from the following:   Height as of this encounter: 5' (1.524 m).   Weight as of this encounter: 61.7 kg.   DVT prophylaxis: SCD Code Status: full code Family Communication: husband at bedside.  Disposition Plan:  Status is: Observation The patient will require care spanning > 2 midnights and should be moved to inpatient because: management of abdominal pain Home when tolerating soft diet, maybe tomorrow.    Consultants:  GI  Procedures:    Antimicrobials:    Subjective: She report mild abdominal pain. She did well on clear liquid diet.    Objective: Vitals:   10/19/22 1420 10/19/22 1440 10/19/22 2137 10/20/22 0623  BP: 133/78 128/81 101/62 134/84  Pulse: 99 95 91 80  Resp: 18 16 18 18   Temp:  97.6 F (36.4 C) 98.8 F (37.1 C) 98.7 F (37.1 C)  TempSrc:  Oral Oral Oral  SpO2: 99% 98% 98% 98%  Weight:      Height:        Intake/Output Summary (Last 24 hours) at 10/20/2022 1241 Last data filed at 10/20/2022 1000 Gross per 24 hour  Intake 1260 ml  Output --  Net 1260 ml   Filed Weights   10/19/22 1041 10/19/22 1234  Weight: 61.7 kg 61.7 kg    Examination:  General exam: NAD Respiratory system: CTA Cardiovascular system: S 1, S 2 RRR Gastrointestinal system: BS present, soft nt Central nervous system: alert.  Extremities: no edema.    Data Reviewed: I have  personally reviewed following labs and imaging studies  CBC: Recent Labs  Lab 10/17/22 1138 10/18/22 0519 10/19/22 1129  WBC 19.1* 17.4* 11.2*  HGB 11.7* 10.1* 10.1*  HCT 35.5* 31.1* 32.5*  MCV 87.7 87.9 93.7  PLT 422* 372 369   Basic Metabolic Panel: Recent Labs  Lab 10/17/22 1138 10/18/22 0519  10/19/22 1129  NA 135 132* 140  K 3.6 3.6 3.8  CL 96* 99 103  CO2 30 27 26   GLUCOSE 120* 132* 102*  BUN 21 18 12   CREATININE 1.07* 1.10* 1.05*  CALCIUM 9.0 8.4* 8.6*   GFR: Estimated Creatinine Clearance: 37.4 mL/min (A) (by C-G formula based on SCr of 1.05 mg/dL (H)). Liver Function Tests: Recent Labs  Lab 10/17/22 1138  AST 16  ALT 7  ALKPHOS 52  BILITOT 0.7  PROT 7.3  ALBUMIN 3.6   Recent Labs  Lab 10/17/22 1138  LIPASE 16   No results for input(s): "AMMONIA" in the last 168 hours. Coagulation Profile: Recent Labs  Lab 10/18/22 0519  INR 1.2   Cardiac Enzymes: No results for input(s): "CKTOTAL", "CKMB", "CKMBINDEX", "TROPONINI" in the last 168 hours. BNP (last 3 results) No results for input(s): "PROBNP" in the last 8760 hours. HbA1C: No results for input(s): "HGBA1C" in the last 72 hours. CBG: No results for input(s): "GLUCAP" in the last 168 hours. Lipid Profile: No results for input(s): "CHOL", "HDL", "LDLCALC", "TRIG", "CHOLHDL", "LDLDIRECT" in the last 72 hours. Thyroid Function Tests: No results for input(s): "TSH", "T4TOTAL", "FREET4", "T3FREE", "THYROIDAB" in the last 72 hours. Anemia Panel: Recent Labs    10/17/22 2120  VITAMINB12 356  FOLATE 7.5  FERRITIN 45  TIBC 246*  IRON 60  RETICCTPCT 2.1   Sepsis Labs: No results for input(s): "PROCALCITON", "LATICACIDVEN" in the last 168 hours.  Recent Results (from the past 240 hour(s))  Resp panel by RT-PCR (RSV, Flu A&B, Covid) Anterior Nasal Swab     Status: None   Collection Time: 10/17/22 11:38 AM   Specimen: Anterior Nasal Swab  Result Value Ref Range Status   SARS Coronavirus 2 by RT PCR NEGATIVE NEGATIVE Final    Comment: (NOTE) SARS-CoV-2 target nucleic acids are NOT DETECTED.  The SARS-CoV-2 RNA is generally detectable in upper respiratory specimens during the acute phase of infection. The lowest concentration of SARS-CoV-2 viral copies this assay can detect is 138 copies/mL. A  negative result does not preclude SARS-Cov-2 infection and should not be used as the sole basis for treatment or other patient management decisions. A negative result may occur with  improper specimen collection/handling, submission of specimen other than nasopharyngeal swab, presence of viral mutation(s) within the areas targeted by this assay, and inadequate number of viral copies(<138 copies/mL). A negative result must be combined with clinical observations, patient history, and epidemiological information. The expected result is Negative.  Fact Sheet for Patients:  BloggerCourse.com  Fact Sheet for Healthcare Providers:  SeriousBroker.it  This test is no t yet approved or cleared by the [place] FDA and  has been authorized for detection and/or diagnosis of SARS-CoV-2 by FDA under an Emergency Use Authorization (EUA). This EUA will remain  in effect (meaning this test can be used) for the duration of the COVID-19 declaration under Section 564(b)(1) of the Act, 21 U.S.C.section 360bbb-3(b)(1), unless the authorization is terminated  or revoked sooner.       Influenza A by PCR NEGATIVE NEGATIVE Final   Influenza B by PCR NEGATIVE NEGATIVE Final    Comment: (  NOTE) The Xpert Xpress SARS-CoV-2/FLU/RSV plus assay is intended as an aid in the diagnosis of influenza from Nasopharyngeal swab specimens and should not be used as a sole basis for treatment. Nasal washings and aspirates are unacceptable for Xpert Xpress SARS-CoV-2/FLU/RSV testing.  Fact Sheet for Patients: BloggerCourse.com  Fact Sheet for Healthcare Providers: SeriousBroker.it  This test is not yet approved or cleared by the [place] FDA and has been authorized for detection and/or diagnosis of SARS-CoV-2 by FDA under an Emergency Use Authorization (EUA). This EUA will remain in effect (meaning this test can  be used) for the duration of the COVID-19 declaration under Section 564(b)(1) of the Act, 21 U.S.C. section 360bbb-3(b)(1), unless the authorization is terminated or revoked.     Resp Syncytial Virus by PCR NEGATIVE NEGATIVE Final    Comment: (NOTE) Fact Sheet for Patients: BloggerCourse.com  Fact Sheet for Healthcare Providers: SeriousBroker.it  This test is not yet approved or cleared by the [place] FDA and has been authorized for detection and/or diagnosis of SARS-CoV-2 by FDA under an Emergency Use Authorization (EUA). This EUA will remain in effect (meaning this test can be used) for the duration of the COVID-19 declaration under Section 564(b)(1) of the Act, 21 U.S.C. section 360bbb-3(b)(1), unless the authorization is terminated or revoked.  Performed at [facility], [address] [place], [place] [address]   Culture, blood (Routine X 2) w Reflex to ID Panel     Status: None (Preliminary result)   Collection Time: 10/17/22  9:09 PM   Specimen: BLOOD  Result Value Ref Range Status   Specimen Description   Final    BLOOD BLOOD LEFT ARM AEROBIC BOTTLE ONLY Performed at [facility], [address] [place], [place] [address]    Special Requests   Final    BOTTLES DRAWN AEROBIC ONLY Blood Culture adequate volume Performed at [facility], [address] [place], [place] [address]    Culture   Final    NO GROWTH 1 DAY Performed at [facility], [address], [place], [place] [address]    Report Status PENDING  Incomplete  Culture, blood (Routine X 2) w Reflex to ID Panel     Status: None (Preliminary result)   Collection Time: 10/17/22  9:20 PM   Specimen: BLOOD  Result Value Ref Range Status   Specimen Description   Final    BLOOD BLOOD RIGHT HAND AEROBIC BOTTLE ONLY Performed at [facility], [address],  [place], [place] [address]    Special Requests   Final    BOTTLES DRAWN AEROBIC ONLY Blood Culture adequate volume Performed at [facility], [address], [place], [place] [address]    Culture   Final    NO GROWTH 1 DAY Performed at [facility], [address], [place], [place] [address]    Report Status PENDING  Incomplete         Radiology Studies: No results found.      Scheduled Meds:  fesoterodine  4 mg Oral Daily   loratadine  10 mg Oral Daily   multivitamin with minerals  1 tablet Oral Daily   pantoprazole (PROTONIX) IV  40 mg Intravenous Q12H   pramipexole  0.5 mg Oral TID   QUEtiapine  100 mg Oral QHS   rosuvastatin  10 mg Oral Daily   sodium chloride flush  3 mL Intravenous Q12H   sucralfate  1 g  Oral TID AC & HS   Continuous Infusions:     LOS: 2 days    Time spent: 35 minutes    Alexandera Kuntzman A Lakeesha Fontanilla, MD [facility]   If 7PM-7AM, please contact night-coverage [email or web address]  10/20/2022, 12:41 PM

## 2022-10-21 DIAGNOSIS — K250 Acute gastric ulcer with hemorrhage: Secondary | ICD-10-CM | POA: Diagnosis not present

## 2022-10-21 DIAGNOSIS — D500 Iron deficiency anemia secondary to blood loss (chronic): Secondary | ICD-10-CM | POA: Diagnosis not present

## 2022-10-21 DIAGNOSIS — G8929 Other chronic pain: Secondary | ICD-10-CM | POA: Diagnosis not present

## 2022-10-21 DIAGNOSIS — R634 Abnormal weight loss: Secondary | ICD-10-CM | POA: Diagnosis not present

## 2022-10-21 DIAGNOSIS — K259 Gastric ulcer, unspecified as acute or chronic, without hemorrhage or perforation: Secondary | ICD-10-CM | POA: Diagnosis not present

## 2022-10-21 LAB — CBC WITH DIFFERENTIAL/PLATELET
Abs Immature Granulocytes: 0.04 10*3/uL (ref 0.00–0.07)
Basophils Absolute: 0 10*3/uL (ref 0.0–0.1)
Basophils Relative: 0 %
Eosinophils Absolute: 0.8 10*3/uL — ABNORMAL HIGH (ref 0.0–0.5)
Eosinophils Relative: 8 %
HCT: 30.4 % — ABNORMAL LOW (ref 36.0–46.0)
Hemoglobin: 9.6 g/dL — ABNORMAL LOW (ref 12.0–15.0)
Immature Granulocytes: 0 %
Lymphocytes Relative: 24 %
Lymphs Abs: 2.4 10*3/uL (ref 0.7–4.0)
MCH: 28.2 pg (ref 26.0–34.0)
MCHC: 31.6 g/dL (ref 30.0–36.0)
MCV: 89.4 fL (ref 80.0–100.0)
Monocytes Absolute: 1.3 10*3/uL — ABNORMAL HIGH (ref 0.1–1.0)
Monocytes Relative: 13 %
Neutro Abs: 5.4 10*3/uL (ref 1.7–7.7)
Neutrophils Relative %: 55 %
Platelets: 396 10*3/uL (ref 150–400)
RBC: 3.4 MIL/uL — ABNORMAL LOW (ref 3.87–5.11)
RDW: 15.6 % — ABNORMAL HIGH (ref 11.5–15.5)
WBC: 9.9 10*3/uL (ref 4.0–10.5)
nRBC: 0 % (ref 0.0–0.2)

## 2022-10-21 LAB — BASIC METABOLIC PANEL
Anion gap: 6 (ref 5–15)
BUN: 10 mg/dL (ref 8–23)
CO2: 29 mmol/L (ref 22–32)
Calcium: 8.4 mg/dL — ABNORMAL LOW (ref 8.9–10.3)
Chloride: 103 mmol/L (ref 98–111)
Creatinine, Ser: 1.09 mg/dL — ABNORMAL HIGH (ref 0.44–1.00)
GFR, Estimated: 53 mL/min — ABNORMAL LOW (ref >60)
Glucose, Bld: 112 mg/dL — ABNORMAL HIGH (ref 70–99)
Potassium: 3.4 mmol/L — ABNORMAL LOW (ref 3.5–5.1)
Sodium: 138 mmol/L (ref 135–145)

## 2022-10-21 LAB — PROTEIN ELECTROPHORESIS, SERUM
A/G Ratio: 0.9 (ref 0.7–1.7)
Albumin ELP: 2.9 g/dL (ref 2.9–4.4)
Alpha-1-Globulin: 0.3 g/dL (ref 0.0–0.4)
Alpha-2-Globulin: 0.6 g/dL (ref 0.4–1.0)
Beta Globulin: 1 g/dL (ref 0.7–1.3)
Gamma Globulin: 1.3 g/dL (ref 0.4–1.8)
Globulin, Total: 3.2 g/dL (ref 2.2–3.9)
Total Protein ELP: 6.1 g/dL (ref 6.0–8.5)

## 2022-10-21 LAB — SURGICAL PATHOLOGY

## 2022-10-21 MED ORDER — SUCRALFATE 1 G PO TABS: 1.0000 g | ORAL_TABLET | Freq: Three times a day (TID) | ORAL | 0 refills | Status: DC

## 2022-10-21 MED ORDER — POTASSIUM CHLORIDE CRYS ER 20 MEQ PO TBCR: 20.0000 meq | EXTENDED_RELEASE_TABLET | Freq: Two times a day (BID) | ORAL | 0 refills | Status: DC

## 2022-10-21 MED ORDER — ENSURE ENLIVE PO LIQD: 237.0000 mL | Freq: Two times a day (BID) | ORAL | 0 refills | Status: AC

## 2022-10-21 MED ORDER — PANTOPRAZOLE SODIUM 40 MG PO TBEC: 40.0000 mg | DELAYED_RELEASE_TABLET | Freq: Two times a day (BID) | ORAL | 0 refills | Status: DC

## 2022-10-21 MED ORDER — SUCRALFATE 1 GM/10ML PO SUSP: 1.0000 g | Freq: Three times a day (TID) | ORAL | 0 refills | Status: DC

## 2022-10-21 MED ORDER — ADULT MULTIVITAMIN W/MINERALS CH: 1.0000 | ORAL_TABLET | Freq: Every day | ORAL | Status: AC

## 2022-10-21 MED ORDER — HYDROXYZINE PAMOATE 50 MG PO CAPS: 50.0000 mg | ORAL_CAPSULE | Freq: Four times a day (QID) | ORAL | Status: AC | PRN

## 2022-10-21 MED ORDER — POTASSIUM CHLORIDE CRYS ER 20 MEQ PO TBCR
40.0000 meq | EXTENDED_RELEASE_TABLET | Freq: Once | ORAL | Status: AC
  Administered 2022-10-21: 40 meq via ORAL
  Filled 2022-10-21: qty 2

## 2022-10-21 NOTE — Plan of Care (Signed)

## 2022-10-21 NOTE — Progress Notes (Addendum)
Patient ID: Mallory Klein, female   DOB: 04/27/1945, 77 y.o.   MRN: [id number]    Progress Note   Subjective   Day # 4 CC; epigastric pain, anorexia  Patient is sitting up in the chair trying to eat breakfast, feels like she could eat more regular food, denies any nausea or vomiting, definitely complaining of less abdominal pain than on admission   Labs today-hemoglobin 9.6/hematocrit 30.4-drifting Potassium 3.4/BUN 10/creatinine 1.09   Objective   Vital signs in last 24 hours: Temp:  [98.6 F (37 C)-99.7 F (37.6 C)] 98.6 F (37 C) (08/28 0634) Pulse Rate:  [80-98] 80 (08/28 0634) Resp:  [16-18] 18 (08/28 0634) BP: (158-162)/(74-88) 158/74 (08/28 0634) SpO2:  [95 %-100 %] 100 % (08/28 0634) Last BM Date : 10/17/22 General:    Elderly African-American female in NAD Heart:  Regular rate and rhythm; no murmurs Lungs: Respirations even and unlabored, lungs CTA bilaterally Abdomen:  Soft, mild epigastric tenderness, no guarding or rebound and nondistended. Normal bowel sounds. Extremities:  Without edema. Neurologic:  Alert and oriented,  grossly normal neurologically-Parkinson's tremor. Psych:  Cooperative. Normal mood and affect.  Intake/Output from previous day: 08/27 0701 - 08/28 0700 In: 1323 [P.O.:1320; I.V.:3] Out: -  Intake/Output this shift: Total I/O In: 120 [P.O.:120] Out: -   Lab Results: Recent Labs    10/19/22 1129 10/21/22 0448  WBC 11.2* 9.9  HGB 10.1* 9.6*  HCT 32.5* 30.4*  PLT 369 396   BMET Recent Labs    10/19/22 1129 10/21/22 0448  NA 140 138  K 3.8 3.4*  CL 103 103  CO2 26 29  GLUCOSE 102* 112*  BUN 12 10  CREATININE 1.05* 1.09*  CALCIUM 8.6* 8.4*   LFT No results for input(s): "PROT", "ALBUMIN", "AST", "ALT", "ALKPHOS", "BILITOT", "BILIDIR", "IBILI" in the last 72 hours. PT/INR No results for input(s): "LABPROT", "INR" in the last 72 hours.  Studies/Results: No results found.     Assessment / Plan:    #1 77 yo  female admitted with 1 month history of upper abdominal pain, weight loss, poor appetite and abnormal CT  EGD revealed a giant circumferential prepyloric ulcer with contact oozing and friability  Biopsies negative for malignancy however much of specimen was fibropurulent exudate  She is definitely improved from a symptomatic standpoint, able to eat, no complaints of nausea or vomiting Not had any evidence of active GI bleeding but has had a slow drift in hemoglobin, certainly had likely been having intermittent low-grade bleeding prior to admission  Again after review of her outpatient medication list yesterday it appears she did have a prescription for Celebrex that was given to her in May 2024, likely culprit for etiology of her ulcer  We still cannot absolutely rule out malignancy  #2 Parkinson's disease with mild decrease in cognitive ability #3 mild hypokalemia  Plan; I think should be discharged home later today, on soft diet Stop IV PPI and switch to twice daily Protonix, she will need twice daily Protonix for at least 8 weeks if not more Please discharge on Carafate 1 g p.o. 4 times daily between meals and at bedtime   Need to be sure that Celebrex is taken off of her medication list for home-sounds as if either her medicines are being prepackaged at her pharmacy or home health is coming in and putting out her medicines for her, need to be sure Celebrex is discontinued  Arrange for outpatient GI follow-up in our office with Dr. Marina Goodell  or myself in a few weeks and will arrange for follow-up EGD at that time.   Principal Problem:   Gastric ulcer Active Problems:   Peptic ulcer disease   Leukocytosis   Anemia   Abnormal CT of the abdomen   Malnutrition of moderate degree     LOS: 3 days   [name] Esterwood PA-C 10/21/2022, 8:46 AM  GI ATTENDING  Interval history and data reviewed.  Patient seen and examined.  Agree with interval progress note as outlined above.  All  parameters stable.  Giant pyloric/prepyloric ulcer.  No evidence for active bleeding.  Pain improving on PPI.  Biopsies negative.  If tolerating diet and not having significant pain, okay to go home on twice daily PPI.  Avoid all NSAIDs, aspirin etc.  Close GI follow-up needed.  We will arrange.  Thanks.  [name]. Eda Keys., M.D. [facility] Division of Gastroenterology

## 2022-10-21 NOTE — Discharge Instructions (Addendum)
Mallory Klein,  You were in the hospital because of abdominal pain which appears to be related to gastritis and an ulcer noted on your evaluation. The GI doctor has recommended for you stop all NSAIDs (Celebrex, Aleve, ibuprofen, naproxen, Advil, Goody Powder, etc). You also had a biopsy of a stricture noted on your endoscopic evaluation. Please follow-up with your PCP and your GI doctors. Please continue a soft diet for the next few days and advance to a regular diet.

## 2022-10-21 NOTE — Discharge Summary (Signed)
Physician Discharge Summary   Patient: Mallory Klein MRN: [id number] DOB: December 02, 1945  Admit date:     10/17/2022  Discharge date: 10/21/2022  Discharge Physician: Jacquelin Hawking, MD   PCP: Tresa Garter, MD   Recommendations at discharge:  PCP follow-up GI follow-up in 6-8 weeks for repeat endoscopy CBC in 3 to 5 days  Discharge Diagnoses: Principal Problem:   Gastric ulcer Active Problems:   Peptic ulcer disease   Leukocytosis   Anemia   Abnormal CT of the abdomen   Malnutrition of moderate degree  Resolved Problems:   * No resolved hospital problems. *  Hospital Course: Mallory Klein is a 77 y.o. female with a history of diverticulosis, hypertension, Parkinson disease, anxiety, hyperlipidemia, overactive bladder.  Patient presented secondary to intermittent epigastric and lower anterior chest area discomfort.  Patient evaluated by gastroenterology and was found to have evidence of gastric ulcer on imaging and upper endoscopy.  Recommendation to discontinue all NSAIDs.  Patient to follow-up with GI as an outpatient for repeat endoscopy.  Assessment and Plan:  Gastric ulcer Patient presenting with abdominal pain.  CT abdomen/pelvis suggestive of gastric ulcer.  Patient started on Protonix twice daily and Carafate.  GI was consulted and performed an upper endoscopy on 8/26 which was significant for erosive esophagitis, peptic stricture and a large prepyloric/pyloric/proximal bulb ulcer.  Biopsy obtained without etiology for ulceration identified.  GI recommendation to discontinue Celebrex, avoid all NSAIDs, continue Protonix and Carafate and follow-up with GI for repeat endoscopy in 6 to 8 weeks.  Constipation/diarrhea Stable.  Mild hyponatremia Mild.  Hydrochlorothiazide was held on admission.  Resolved.  Leukocytosis White blood cell count of 19,400 on day of admission.  Acute anemia Mild.  Possibly related to gastric ulcer and possible bleeding.  Workup  negative for hemolytic anemia.  Repeat CBC as an outpatient  Parkinson disease Continue pramipexole.  Depression Continue Seroquel.  Anxiety Continue hydroxyzine as needed.  Hyperlipidemia Continue Crestor.  Overactive bladder Continue Vesicare   Consultants: [facility] Procedures performed: Upper GI endoscopy  Disposition: Home Diet recommendation: Regular diet  DISCHARGE MEDICATION: Allergies as of 10/21/2022       Reactions   Ace Inhibitors    Patient doesn't recall reaction.  jkl   Benicar [olmesartan]    It made her sick   Aspirin Other (See Comments)   bruising   Citalopram Hydrobromide Diarrhea        Medication List     STOP taking these medications    celecoxib 200 MG capsule Commonly known as: CELEBREX       TAKE these medications    cetirizine 10 MG tablet Commonly known as: ZYRTEC TAKE ONE TABLET BY MOUTH EVERY DAY   [employer] Senna Plus 8.6-50 MG tablet Generic drug: senna-docusate Take 1 tablet by mouth 2 (two) times daily.   cyanocobalamin 1000 MCG/ML injection Commonly known as: VITAMIN B12 INJECT 1 ML INTO THE SKIN EVERY 14 DAYS   diclofenac Sodium 1 % Gel Commonly known as: VOLTAREN Apply 4 g topically daily as needed (pain).   docusate sodium 100 MG capsule Commonly known as: COLACE Take 100 mg by mouth at bedtime.   feeding supplement Liqd Take 237 mLs by mouth 2 (two) times daily between meals.   hydrochlorothiazide 12.5 MG capsule Commonly known as: MICROZIDE Take 12.5 mg by mouth daily.   hydrOXYzine 50 MG capsule Commonly known as: VISTARIL Take 1 capsule (50 mg total) by mouth every 6 (six) hours as needed for  anxiety. What changed: how much to take   multivitamin with minerals Tabs tablet Take 1 tablet by mouth daily.   pantoprazole 40 MG tablet Commonly known as: PROTONIX Take 1 tablet (40 mg total) by mouth 2 (two) times daily.   polyethylene glycol 17 g packet Commonly known as: MIRALAX / GLYCOLAX Take  17 g by mouth daily as needed for mild constipation.   potassium chloride SA 20 MEQ tablet Commonly known as: KLOR-CON M Take 1 tablet (20 mEq total) by mouth 2 (two) times daily for 1 day.   Pramipexole Dihydrochloride 0.75 MG Tb24 Take 1 tablet (0.75 mg total) by mouth in the morning and at bedtime.   QUEtiapine 100 MG tablet Commonly known as: SEROQUEL TAKE ONE TABLET BY MOUTH AT BEDTIME   rosuvastatin 10 MG tablet Commonly known as: CRESTOR TAKE ONE TABLET BY MOUTH EVERY DAY   solifenacin 5 MG tablet Commonly known as: VESICARE TAKE ONE TABLET BY MOUTH EVERY DAY   sucralfate 1 g tablet Commonly known as: Carafate Take 1 tablet (1 g total) by mouth 4 (four) times daily -  with meals and at bedtime.   Vitamin D3 50 MCG (2000 UT) capsule TAKE 1 CAPSULE BY MOUTH EVERY DAY        Discharge Exam: BP 118/70 (BP Location: Right Arm)   Pulse 87   Temp 99 F (37.2 C) (Oral)   Resp 16   Ht 5' (1.524 m)   Wt 61.7 kg   SpO2 97%   BMI 26.57 kg/m   General exam: Appears calm and comfortable   Condition at discharge: stable  The results of significant diagnostics from this hospitalization (including imaging, microbiology, ancillary and laboratory) are listed below for reference.   Imaging Studies: CT ABDOMEN PELVIS W CONTRAST  Result Date: 10/17/2022 CLINICAL DATA:  Acute left upper abdominal pain EXAM: CT ABDOMEN AND PELVIS WITH CONTRAST TECHNIQUE: Multidetector CT imaging of the abdomen and pelvis was performed using the standard protocol following bolus administration of intravenous contrast. RADIATION DOSE REDUCTION: This exam was performed according to the departmental dose-optimization program which includes automated exposure control, adjustment of the mA and/or kV according to patient size and/or use of iterative reconstruction technique. CONTRAST:  75mL OMNIPAQUE IOHEXOL 350 MG/ML SOLN COMPARISON:  09/15/2022 FINDINGS: Lower chest: Mild cardiomegaly. Hepatobiliary:  Unremarkable Pancreas: Atrophic pancreas with borderline dilatation of the dorsal pancreatic duct. Otherwise unremarkable. Spleen: Absent aside from small regenerative splenic lesions, compatible with splenectomy. Adrenals/Urinary Tract: Benign bilateral renal cysts. No further imaging workup of these lesions is indicated. Adrenal glands unremarkable.  Urinary bladder appears unremarkable. Stomach/Bowel: Abnormal wall thickening in the gastric antrum compatible with gastritis. Suspected large cephalad ulcer of the stomach on image 60 series 5. Underlying gastric malignancy not excluded. Normal appendix.  No dilated bowel. Vascular/Lymphatic: Atherosclerosis is present, including aortoiliac atherosclerotic disease. Reproductive: Uterus absent. Other: No supplemental non-categorized findings. Musculoskeletal: Moderate degenerative arthropathy of both hips. Lower thoracic and lumbar spondylosis and degenerative disc disease causing multilevel impingement. Previous posterior decompression at L3. IMPRESSION: 1. Abnormal wall thickening in the gastric antrum suggesting potentially severe gastritis. Suspected large cephalad ulcer of the stomach antrum. No current free intraperitoneal gas. Underlying gastric malignancy not excluded. Follow up endoscopy is recommended. 2. Aortic atherosclerosis. 3. Mild cardiomegaly. 4. Moderate degenerative arthropathy of both hips. 5. Lower thoracic and lumbar spondylosis and degenerative disc disease causing multilevel impingement. Aortic Atherosclerosis (ICD10-I70.0). Electronically Signed   By: Gaylyn Rong M.D.   On: 10/17/2022 13:29  CT Angio Chest PE W and/or Wo Contrast  Result Date: 10/17/2022 CLINICAL DATA:  PE suspected, left chest pain EXAM: CT ANGIOGRAPHY CHEST WITH CONTRAST TECHNIQUE: Multidetector CT imaging of the chest was performed using the standard protocol during bolus administration of intravenous contrast. Multiplanar CT image reconstructions and MIPs were  obtained to evaluate the vascular anatomy. RADIATION DOSE REDUCTION: This exam was performed according to the departmental dose-optimization program which includes automated exposure control, adjustment of the mA and/or kV according to patient size and/or use of iterative reconstruction technique. CONTRAST:  75mL OMNIPAQUE IOHEXOL 350 MG/ML SOLN COMPARISON:  None Available. FINDINGS: Cardiovascular: Satisfactory opacification of the pulmonary arteries to the segmental level. No evidence of pulmonary embolism. Cardiomegaly. No pericardial effusion. Aortic atherosclerosis Mediastinum/Nodes: No enlarged mediastinal, hilar, or axillary lymph nodes. Thyroid gland, trachea, and esophagus demonstrate no significant findings. Lungs/Pleura: Trace pleural effusions.  No acute airspace opacity. Upper Abdomen: Please see separately reported examination of the abdomen pelvis Musculoskeletal: No chest wall abnormality. No acute osseous findings. Review of the MIP images confirms the above findings. IMPRESSION: 1. Negative examination for pulmonary embolism. 2. Trace pleural effusions. No acute airspace opacity. 3. Cardiomegaly. Aortic Atherosclerosis (ICD10-I70.0). Electronically Signed   By: Jearld Lesch M.D.   On: 10/17/2022 13:20   DG Chest Port 1 View  Result Date: 10/17/2022 CLINICAL DATA:  Left-sided chest pain EXAM: PORTABLE CHEST 1 VIEW COMPARISON:  05/12/2016 FINDINGS: Cardiomegaly. Unchanged elevation of the left hemidiaphragm. Both lungs are clear. The visualized skeletal structures are unremarkable. IMPRESSION: Cardiomegaly without acute abnormality of the lungs in AP portable projection. Electronically Signed   By: Jearld Lesch M.D.   On: 10/17/2022 12:18    Microbiology: Results for orders placed or performed during the hospital encounter of 10/17/22  Resp panel by RT-PCR (RSV, Flu A&B, Covid) Anterior Nasal Swab     Status: None   Collection Time: 10/17/22 11:38 AM   Specimen: Anterior Nasal Swab   Result Value Ref Range Status   SARS Coronavirus 2 by RT PCR NEGATIVE NEGATIVE Final    Comment: (NOTE) SARS-CoV-2 target nucleic acids are NOT DETECTED.  The SARS-CoV-2 RNA is generally detectable in upper respiratory specimens during the acute phase of infection. The lowest concentration of SARS-CoV-2 viral copies this assay can detect is 138 copies/mL. A negative result does not preclude SARS-Cov-2 infection and should not be used as the sole basis for treatment or other patient management decisions. A negative result may occur with  improper specimen collection/handling, submission of specimen other than nasopharyngeal swab, presence of viral mutation(s) within the areas targeted by this assay, and inadequate number of viral copies(<138 copies/mL). A negative result must be combined with clinical observations, patient history, and epidemiological information. The expected result is Negative.  Fact Sheet for Patients:  BloggerCourse.com  Fact Sheet for Healthcare Providers:  SeriousBroker.it  This test is no t yet approved or cleared by the [place] FDA and  has been authorized for detection and/or diagnosis of SARS-CoV-2 by FDA under an Emergency Use Authorization (EUA). This EUA will remain  in effect (meaning this test can be used) for the duration of the COVID-19 declaration under Section 564(b)(1) of the Act, 21 U.S.C.section 360bbb-3(b)(1), unless the authorization is terminated  or revoked sooner.       Influenza A by PCR NEGATIVE NEGATIVE Final   Influenza B by PCR NEGATIVE NEGATIVE Final    Comment: (NOTE) The Xpert Xpress SARS-CoV-2/FLU/RSV plus assay is intended as an aid in the  diagnosis of influenza from Nasopharyngeal swab specimens and should not be used as a sole basis for treatment. Nasal washings and aspirates are unacceptable for Xpert Xpress SARS-CoV-2/FLU/RSV testing.  Fact Sheet for  Patients: BloggerCourse.com  Fact Sheet for Healthcare Providers: SeriousBroker.it  This test is not yet approved or cleared by the [place] FDA and has been authorized for detection and/or diagnosis of SARS-CoV-2 by FDA under an Emergency Use Authorization (EUA). This EUA will remain in effect (meaning this test can be used) for the duration of the COVID-19 declaration under Section 564(b)(1) of the Act, 21 U.S.C. section 360bbb-3(b)(1), unless the authorization is terminated or revoked.     Resp Syncytial Virus by PCR NEGATIVE NEGATIVE Final    Comment: (NOTE) Fact Sheet for Patients: BloggerCourse.com  Fact Sheet for Healthcare Providers: SeriousBroker.it  This test is not yet approved or cleared by the [place] FDA and has been authorized for detection and/or diagnosis of SARS-CoV-2 by FDA under an Emergency Use Authorization (EUA). This EUA will remain in effect (meaning this test can be used) for the duration of the COVID-19 declaration under Section 564(b)(1) of the Act, 21 U.S.C. section 360bbb-3(b)(1), unless the authorization is terminated or revoked.  Performed at [facility], [address] [place], [place] [address]   Culture, blood (Routine X 2) w Reflex to ID Panel     Status: None (Preliminary result)   Collection Time: 10/17/22  9:09 PM   Specimen: BLOOD  Result Value Ref Range Status   Specimen Description   Final    BLOOD BLOOD LEFT ARM AEROBIC BOTTLE ONLY Performed at [facility], [address], [place], [place] [address]    Special Requests   Final    BOTTLES DRAWN AEROBIC ONLY Blood Culture adequate volume Performed at [facility], [address] [place], [place] [address]    Culture   Final    NO GROWTH 3 DAYS Performed at [facility], [address] [place],  [place] [address]    Report Status PENDING  Incomplete  Culture, blood (Routine X 2) w Reflex to ID Panel     Status: None (Preliminary result)   Collection Time: 10/17/22  9:20 PM   Specimen: BLOOD  Result Value Ref Range Status   Specimen Description   Final    BLOOD BLOOD RIGHT HAND AEROBIC BOTTLE ONLY Performed at [facility], [address] [place], [place] [address]    Special Requests   Final    BOTTLES DRAWN AEROBIC ONLY Blood Culture adequate volume Performed at [facility], [address] [place], [place] [address]    Culture   Final    NO GROWTH 3 DAYS Performed at [facility], [address] [place], [place] [address]    Report Status PENDING  Incomplete    Labs: CBC: Recent Labs  Lab 10/17/22 1138 10/18/22 0519 10/19/22 1129 10/21/22 0448  WBC 19.1* 17.4* 11.2* 9.9  NEUTROABS  --   --   --  5.4  HGB 11.7* 10.1* 10.1* 9.6*  HCT 35.5* 31.1* 32.5* 30.4*  MCV 87.7 87.9 93.7 89.4  PLT 422* 372 369 396   Basic Metabolic Panel: Recent Labs  Lab 10/17/22 1138 10/18/22 0519 10/19/22 1129 10/21/22 0448  NA 135 132* 140 138  K 3.6 3.6 3.8 3.4*  CL 96* 99 103 103  CO2 30 27 26 29   GLUCOSE 120* 132* 102* 112*  BUN 21  18 12 10   CREATININE 1.07* 1.10* 1.05* 1.09*  CALCIUM 9.0 8.4* 8.6* 8.4*   Liver Function Tests: Recent Labs  Lab 10/17/22 1138  AST 16  ALT 7  ALKPHOS 52  BILITOT 0.7  PROT 7.3  ALBUMIN 3.6    Discharge time spent: 35 minutes.  Signed: Jacquelin Hawking, MD [facility] 10/21/2022

## 2022-10-22 ENCOUNTER — Encounter: Payer: Self-pay | Admitting: *Deleted

## 2022-10-22 ENCOUNTER — Telehealth: Payer: Self-pay

## 2022-10-22 ENCOUNTER — Telehealth: Payer: Self-pay | Admitting: *Deleted

## 2022-10-22 NOTE — Transitions of Care (Post Inpatient/ED Visit) (Signed)
   10/22/2022  Name: Mallory Klein MRN: [id number] DOB: 1945-11-04  Today's TOC FU Call Status: Today's TOC FU Call Status:: Unsuccessful Call (1st Attempt) Unsuccessful Call (1st Attempt) Date: 10/22/22  Attempted to reach the patient regarding the most recent Inpatient visit; Received automated outgoing voice message stating that "the person you are trying to call has a voice mail box that is full; please hang up and try your call again later;" unable to leave voice message requesting call back   Follow Up Plan: Additional outreach attempts will be made to reach the patient to complete the Transitions of Care (Post Inpatient visit) call.   Caryl Pina, RN, BSN, CCRN Alumnus RN CM Care Coordination/ Transition of Care- [facility] Care Management [phone]: direct office

## 2022-10-22 NOTE — Telephone Encounter (Signed)
Spoke with the patient. Scheduled her hospital follow up for prepyloric ulcer and anemia 11/18/22 at 4:00 pm. Advised her not to take Celebrex. She may take Tylenol if needed for pain.

## 2022-10-23 ENCOUNTER — Encounter: Payer: Self-pay | Admitting: *Deleted

## 2022-10-23 ENCOUNTER — Telehealth: Payer: Self-pay | Admitting: *Deleted

## 2022-10-23 LAB — CULTURE, BLOOD (ROUTINE X 2)
Culture: NO GROWTH
Culture: NO GROWTH
Special Requests: ADEQUATE
Special Requests: ADEQUATE

## 2022-10-23 NOTE — Transitions of Care (Post Inpatient/ED Visit) (Signed)
10/23/2022  Name: Mallory Klein MRN: [id number] DOB: September 10, 1945  Today's TOC FU Call Status: Today's TOC FU Call Status:: Successful TOC FU Call Completed TOC FU Call Complete Date: 10/23/22 Patient's Name and Date of Birth confirmed.  Transition Care Management Follow-up Telephone Call Date of Discharge: 10/21/22 Discharge Facility: [facility]) Type of Discharge: Inpatient Admission Primary Inpatient Discharge Diagnosis:: epigatric pain/ gastric ulcer- endoscopy How have you been since you were released from the hospital?: Better ("I am doing okay this morning, but I had more stomach pain near my groin last night that kept me awake.  It seems a little better this morning.  Thanks for your patientce in going over all of these instructions, I had not looked at them yet") Any questions or concerns?: Yes Patient Questions/Concerns:: Ongoing intermittent pain reported post-endoscopy Patient Questions/Concerns Addressed: Other: (encoiraged patient to take medications as prescribed; follow recommended diet post-hospital discharge, and to call GI provider if pain worsens or continues- confirmed she has contact information for GI provider)  Items Reviewed: Did you receive and understand the discharge instructions provided?: Yes (thoroughly reviewed with patient who verbalizes good understanding of same) Medications obtained,verified, and reconciled?: Partial Review Completed (Partial review completed; confirmed patient obtained/ is taking all newly Rx'd medications as instructed; spouse-manages medications and denies questions/ concerns around medications today) Reason for Partial Mediation Review: Patient reports she is unable to review all of her medications at this time- not physically near her medications Any new allergies since your discharge?: No Dietary orders reviewed?: Yes Type of Diet Ordered:: "Bland and soft easily digestable food" Do you have support at home?: Yes People in  Home: spouse Name of Support/Comfort Primary Source: Reports independent in self-care activities; supportive spouse assists as/ if needed/ indicated  Medications Reviewed Today: Medications Reviewed Today     Reviewed by Michaela Corner, RN (Registered Nurse) on 10/23/22 at 1027  Med List Status: <None>   Medication Order Taking? Sig Documenting Provider Last Dose Status Informant  cetirizine (ZYRTEC) 10 MG tablet [id number] Yes TAKE ONE TABLET BY MOUTH EVERY DAY Plotnikov, Georgina Quint, MD Taking Active Other, Pharmacy Records           Med Note Randa Evens, Floyde Parkins Oct 18, 2022 12:44 PM) LF: 06/24/22 200mg  90d  Cholecalciferol (VITAMIN D3) 50 MCG (2000 UT) capsule [id number]  TAKE 1 CAPSULE BY MOUTH EVERY DAY Plotnikov, Georgina Quint, MD  Active Other, Pharmacy Records  [facility] SENNA PLUS 8.6-50 MG tablet [id number]  Take 1 tablet by mouth 2 (two) times daily. [provider]  Active Other, Pharmacy Records  cyanocobalamin (VITAMIN B12) 1000 MCG/ML injection [id number]  INJECT 1 ML INTO THE SKIN EVERY 14 DAYS Corwin Levins, MD  Active Other, Pharmacy Records  diclofenac Sodium (VOLTAREN) 1 % GEL [id number]  Apply 4 g topically daily as needed (pain). [provider]  Active Other, Pharmacy Records  docusate sodium (COLACE) 100 MG capsule [id number]  Take 100 mg by mouth at bedtime. [provider]  Active Other, Pharmacy Records   Patient not taking:   Discontinued 07/23/20 1924          Med Note Deatra James   Tue Jun 25, 2020  2:21 PM)    feeding supplement (ENSURE ENLIVE / ENSURE PLUS) LIQD [id number] Yes Take 237 mLs by mouth 2 (two) times daily between meals. Narda Bonds, MD Taking Active   hydrochlorothiazide (MICROZIDE) 12.5 MG capsule [id number] Yes Take 12.5 mg by mouth  daily. [provider] Taking Active Other, Pharmacy Records           Med Note Randa Evens, Floyde Parkins Oct 18, 2022 12:48 PM) LF: 09/21/22 12.5 cap 90d  hydrOXYzine (VISTARIL) 50 MG  capsule [id number]  Take 1 capsule (50 mg total) by mouth every 6 (six) hours as needed for anxiety. Narda Bonds, MD  Active   Multiple Vitamin (MULTIVITAMIN WITH MINERALS) TABS tablet [id number]  Take 1 tablet by mouth daily. Narda Bonds, MD  Active   pantoprazole (PROTONIX) 40 MG tablet [id number] Yes Take 1 tablet (40 mg total) by mouth 2 (two) times daily. Narda Bonds, MD Taking Active   polyethylene glycol (MIRALAX / GLYCOLAX) 17 g packet [id number]  Take 17 g by mouth daily as needed for mild constipation. [provider]  Active Other, Pharmacy Records  potassium chloride SA (KLOR-CON M) 20 MEQ tablet [id number]  Take 1 tablet (20 mEq total) by mouth 2 (two) times daily for 1 day. Narda Bonds, MD  Expired 10/22/22 2359            Med Note Michaela Corner   Fri Oct 23, 2022 10:13 AM) 10/23/22: Reports during [facility] call she took as prescribed  Pramipexole Dihydrochloride 0.75 MG TB24 [id number] Yes Take 1 tablet (0.75 mg total) by mouth in the morning and at bedtime. Shawnie Dapper, NP Taking Active Other, Pharmacy Records           Med Note Pollyann Kennedy Oct 18, 2022 12:45 PM) LF: 09/21/22 0.75mg  tab 90d  QUEtiapine (SEROQUEL) 100 MG tablet [id number]  TAKE ONE TABLET BY MOUTH AT BEDTIME Plotnikov, Georgina Quint, MD  Active Other, Pharmacy Records           Med Note Randa Evens, Floyde Parkins Oct 18, 2022 12:45 PM) LF: 09/21/22 100mg  tab 90d  rosuvastatin (CRESTOR) 10 MG tablet [id number]  TAKE ONE TABLET BY MOUTH EVERY DAY Plotnikov, Georgina Quint, MD  Active Other, Pharmacy Records           Med Note Pollyann Kennedy Oct 18, 2022 12:44 PM) LF: 09/21/22 10mg  tab 60d  solifenacin (VESICARE) 5 MG tablet [id number]  TAKE ONE TABLET BY MOUTH EVERY DAY Plotnikov, Georgina Quint, MD  Active Other, Pharmacy Records           Med Note Pollyann Kennedy Oct 18, 2022 12:46 PM) LF: 09/21/22 5mg  tab 90d  sucralfate (CARAFATE) 1 g tablet [id number] Yes Take 1 tablet (1 g total)  by mouth 4 (four) times daily -  with meals and at bedtime. Narda Bonds, MD Taking Active    Patient not taking:   Discontinued 07/23/20 1924             Home Care and Equipment/Supplies: Were Home Health Services Ordered?: No Any new equipment or medical supplies ordered?: No  Functional Questionnaire: Do you need assistance with bathing/showering or dressing?: Yes (husband assisting as indicated) Do you need assistance with meal preparation?: Yes (husband assisting as indicated) Do you need assistance with eating?: No Do you have difficulty maintaining continence: No Do you need assistance with getting out of bed/getting out of a chair/moving?: Yes (husband assisting as indicated) Do you have difficulty managing or taking your medications?: Yes (husband assisting as indicated)  Follow up appointments reviewed: PCP Follow-up appointment confirmed?: Yes (care coordination outreach in real-time with scheduling care guide to successfully  schedule hospital follow up PCP appointment 10/29/22) Date of PCP follow-up appointment?: 10/29/22 Follow-up Provider: PCP- covering APP- Hetty Blend NP Specialist Hospital Follow-up appointment confirmed?: Yes Date of Specialist follow-up appointment?: 11/18/22 Follow-Up Specialty Provider:: GI provider Do you need transportation to your follow-up appointment?: No Do you understand care options if your condition(s) worsen?: Yes-patient verbalized understanding  SDOH Interventions Today    Flowsheet Row Most Recent Value  SDOH Interventions   Food Insecurity Interventions Intervention Not Indicated  Transportation Interventions Intervention Not Indicated  [husband provides transportation]      TOC Interventions Today    Flowsheet Row Most Recent Value  TOC Interventions   TOC Interventions Discussed/Reviewed TOC Interventions Discussed, Arranged PCP follow up less than 12 days/Care Guide scheduled      Interventions Today     Flowsheet Row Most Recent Value  Chronic Disease   Chronic disease during today's visit Other  [gastric ulcer- endoscopy]  General Interventions   General Interventions Discussed/Reviewed General Interventions Discussed, Doctor Visits, Durable Medical Equipment (DME)  Doctor Visits Discussed/Reviewed Doctor Visits Discussed, PCP, Specialist  Durable Medical Equipment (DME) Val Riles currently requiring/ using assistive devices - walker]  PCP/Specialist Visits Compliance with follow-up visit  Education Interventions   Education Provided Provided Education  Provided Verbal Education On Nutrition, Medication  [extensivge review/ reinforcement around recommended diet and medications post-hospital discharge]  Nutrition Interventions   Nutrition Discussed/Reviewed Nutrition Discussed, Supplemental nutrition  Pharmacy Interventions   Pharmacy Dicussed/Reviewed Pharmacy Topics Discussed  Safety Interventions   Safety Discussed/Reviewed Safety Discussed, Fall Risk      Caryl Pina, RN, BSN, CCRN Alumnus RN CM Care Coordination/ Transition of Care- [facility] Care Management [phone]: direct office

## 2022-10-24 NOTE — Hospital Course (Signed)
Mallory Klein is a 77 y.o. female with a history of diverticulosis, hypertension, Parkinson disease, anxiety, hyperlipidemia, overactive bladder.  Patient presented secondary to intermittent epigastric and lower anterior chest area discomfort.  Patient evaluated by gastroenterology and was found to have evidence of gastric ulcer on imaging and upper endoscopy.  Recommendation to discontinue all NSAIDs.  Patient to follow-up with GI as an outpatient for repeat endoscopy.

## 2022-10-29 ENCOUNTER — Encounter: Payer: Self-pay | Admitting: Family Medicine

## 2022-10-29 ENCOUNTER — Ambulatory Visit (INDEPENDENT_AMBULATORY_CARE_PROVIDER_SITE_OTHER): Payer: Medicare Other | Admitting: Family Medicine

## 2022-10-29 VITALS — BP 114/72 | HR 73 | Temp 97.6°F | Ht 60.0 in

## 2022-10-29 DIAGNOSIS — E876 Hypokalemia: Secondary | ICD-10-CM | POA: Diagnosis not present

## 2022-10-29 DIAGNOSIS — D72829 Elevated white blood cell count, unspecified: Secondary | ICD-10-CM

## 2022-10-29 DIAGNOSIS — Z09 Encounter for follow-up examination after completed treatment for conditions other than malignant neoplasm: Secondary | ICD-10-CM

## 2022-10-29 DIAGNOSIS — K259 Gastric ulcer, unspecified as acute or chronic, without hemorrhage or perforation: Secondary | ICD-10-CM

## 2022-10-29 DIAGNOSIS — K5904 Chronic idiopathic constipation: Secondary | ICD-10-CM

## 2022-10-29 LAB — CBC WITH DIFFERENTIAL/PLATELET
Basophils Absolute: 0.1 10*3/uL (ref 0.0–0.1)
Basophils Relative: 0.8 % (ref 0.0–3.0)
Eosinophils Absolute: 0.4 10*3/uL (ref 0.0–0.7)
Eosinophils Relative: 5.3 % — ABNORMAL HIGH (ref 0.0–5.0)
HCT: 32.7 % — ABNORMAL LOW (ref 36.0–46.0)
Hemoglobin: 10.6 g/dL — ABNORMAL LOW (ref 12.0–15.0)
Lymphocytes Relative: 30.3 % (ref 12.0–46.0)
Lymphs Abs: 2.4 10*3/uL (ref 0.7–4.0)
MCHC: 32.5 g/dL (ref 30.0–36.0)
MCV: 88.7 fl (ref 78.0–100.0)
Monocytes Absolute: 0.7 10*3/uL (ref 0.1–1.0)
Monocytes Relative: 8.7 % (ref 3.0–12.0)
Neutro Abs: 4.3 10*3/uL (ref 1.4–7.7)
Neutrophils Relative %: 54.9 % (ref 43.0–77.0)
Platelets: 492 10*3/uL — ABNORMAL HIGH (ref 150.0–400.0)
RBC: 3.68 Mil/uL — ABNORMAL LOW (ref 3.87–5.11)
RDW: 15.3 % (ref 11.5–15.5)
WBC: 7.8 10*3/uL (ref 4.0–10.5)

## 2022-10-29 LAB — COMPREHENSIVE METABOLIC PANEL
ALT: 11 U/L (ref 0–35)
AST: 21 U/L (ref 0–37)
Albumin: 3.3 g/dL — ABNORMAL LOW (ref 3.5–5.2)
Alkaline Phosphatase: 48 U/L (ref 39–117)
BUN: 16 mg/dL (ref 6–23)
CO2: 38 meq/L — ABNORMAL HIGH (ref 19–32)
Calcium: 9.3 mg/dL (ref 8.4–10.5)
Chloride: 99 meq/L (ref 96–112)
Creatinine, Ser: 1.4 mg/dL — ABNORMAL HIGH (ref 0.40–1.20)
GFR: 36.45 mL/min — ABNORMAL LOW (ref >60.00)
Glucose, Bld: 93 mg/dL (ref 70–99)
Potassium: 3.3 meq/L — ABNORMAL LOW (ref 3.5–5.1)
Sodium: 139 meq/L (ref 135–145)
Total Bilirubin: 0.5 mg/dL (ref 0.2–1.2)
Total Protein: 7.2 g/dL (ref 6.0–8.3)

## 2022-10-29 MED ORDER — POTASSIUM CHLORIDE CRYS ER 20 MEQ PO TBCR
20.0000 meq | EXTENDED_RELEASE_TABLET | Freq: Every day | ORAL | 0 refills | Status: DC
Start: 2022-10-29 — End: 2023-04-16

## 2022-10-29 NOTE — Progress Notes (Signed)
Subjective:     Patient ID: Mallory Klein, female    DOB: 03/13/1945, 77 y.o.   MRN: [id number]  Chief Complaint  Patient presents with   Hospitalization Follow-up    D/c 8/28 from [facility] for Gastric ulcer, doing good but still hasn't have a bowel movement, seeing surgeon again 25th. Was taking some medication but didn't help so stopped    HPI  Discussed the use of AI scribe software for clinical note transcription with the patient, who gave verbal consent to proceed.  History of Present Illness         Here for hospital discharge follow up.   Admitted on 10/17/2022 and discharged on 10/21/2022 for gastric ulcer seen on EGD and imaging.   Constipation - is not currently taking anything for this.   She has a bottle of Celebrex and states she took one today for back pain.   Does not drink many fluids.   No fever, chills, abdominal pain, N/V/D.      Health Maintenance Due  Topic Date Due   Colonoscopy  03/31/2021   DTaP/Tdap/Td (2 - Tdap) 07/20/2022   INFLUENZA VACCINE  09/24/2022   COVID-19 Vaccine (4 - 2023-24 season) 10/25/2022    Past Medical History:  Diagnosis Date   Abnormal CBC 02/05/2015   Dr Mosetta Putt 12/16 new: normocytic anemia and thrombocytosis   Adjustment disorder with mixed anxiety and depressed mood 02/15/2007   Chronic  Chronic pain Grief, stress Effexor XR  Dad died in February 21, 2023   Asthma    Diverticulosis of colon (without mention of hemorrhage)    Esophageal stricture    Essential hypertension 01/26/2007   Chronic Verapamil    Falls    "I blackout"   GERD (gastroesophageal reflux disease) 02/15/2007   Chronic     Heart murmur    Hyperglycemia 03/02/2011   Mild     Insomnia disorder 12/08/2018   10/20 Carbid/Lev dose was increased - c/o hard time falling asleep (6 am) getting up at 12 am, poor sleep. Try Temazepam 15-30 mg at 11-1:30 pm    Internal hemorrhoids without mention of complication    Iron deficiency anemia 04/23/2017   Mild  neurocognitive disorder, likely due to Parkinson's disease 02/09/2019   OSA (obstructive sleep apnea) 02/14/2010   In lab study (May 2018): AHI 25. Only had 45 minutes of sleep secondary to frequent awakenings secondary to sleep apnea. autocpap 5-15 cm water.    Osteoarthritis    Parkinson disease 2014-02-20   2015 2017  Primidone - d/c 2019 Parkinson's: Sinmet IR. Dr Tat   Postherpetic trigeminal neuralgia 01/26/2007   Qualifier: Diagnosis of  By: Charlsie Quest RMA, [name]     Shortness of breath dyspnea    with allergies   Thrombocytosis 05/24/2017   Urinary incontinence 01/26/2007   Chronic  10/17 Detrol LA    Vitamin D deficiency 01/26/2019    Past Surgical History:  Procedure Laterality Date   ABDOMINAL HYSTERECTOMY     BIOPSY  10/19/2022   Procedure: BIOPSY;  Surgeon: Hilarie Fredrickson, MD;  Location: [facility];  Service: Gastroenterology;;   BREAST CYST EXCISION Left 10/25/1978   COLONOSCOPY     Multiple   ESOPHAGOGASTRODUODENOSCOPY (EGD) WITH PROPOFOL N/A 10/19/2022   Procedure: ESOPHAGOGASTRODUODENOSCOPY (EGD) WITH PROPOFOL;  Surgeon: Hilarie Fredrickson, MD;  Location: [facility];  Service: Gastroenterology;  Laterality: N/A;   LUMBAR LAMINECTOMY/DECOMPRESSION MICRODISCECTOMY Bilateral 02/20/2014   Procedure: LUMBAR TWO THREE, LUMBAR THREE FOUR LUMBAR LAMINECTOMY/DECOMPRESSION MICRODISCECTOMY 2 LEVELS;  Surgeon: Tia Alert, MD;  Location: [facility]URO ORS;  Service: Neurosurgery;  Laterality: Bilateral;   SPLENECTOMY     TIBIA FRACTURE SURGERY Right    TIBIA FRACTURE SURGERY Left    TOTAL KNEE ARTHROPLASTY Right 02/24/2011    Family History  Problem Relation Age of Onset   Hypertension Mother    Diabetes type II Mother    Asthma Mother    Diabetes Mother    Dementia Mother    Prostate cancer Father    Mental illness Father        dementia   Stroke Brother        died in prison   Suicidality Son    Colon cancer Neg Hx    Rectal cancer Neg Hx    Parkinson's disease Neg Hx      Social History   Socioeconomic History   Marital status: Married    Spouse name: Not on file   Number of children: 2   Years of education: 14   Highest education level: Tax adviser degree: occupational, Scientist, product/process development, or vocational program  Occupational History   Occupation: Retired    Comment: Diplomatic Services operational officer, Hospital doctor   Tobacco Use   Smoking status: Never   Smokeless tobacco: Never  Vaping Use   Vaping status: Never Used  Substance and Sexual Activity   Alcohol use: Not Currently    Comment: wine on occ    Drug use: No   Sexual activity: Not Currently  Other Topics Concern   Not on file  Social History Narrative   Drink tea and coffee on occ   Right handed   1 story    Lives with spouse   Social Determinants of Health   Financial Resource Strain: Low Risk  (06/24/2022)   Overall Financial Resource Strain (CARDIA)    Difficulty of Paying Living Expenses: Not hard at all  Food Insecurity: No Food Insecurity (10/23/2022)   Hunger Vital Sign    Worried About Running Out of Food in the Last Year: Never true    Ran Out of Food in the Last Year: Never true  Transportation Needs: No Transportation Needs (10/23/2022)   PRAPARE - Administrator, Civil Service (Medical): No    Lack of Transportation (Non-Medical): No  Physical Activity: Inactive (06/24/2022)   Exercise Vital Sign    Days of Exercise per Week: 0 days    Minutes of Exercise per Session: 0 min  Stress: No Stress Concern Present (06/24/2022)   [employer] of Occupational Health - Occupational Stress Questionnaire    Feeling of Stress : Not at all  Social Connections: Socially Integrated (06/24/2022)   Social Connection and Isolation Panel [NHANES]    Frequency of Communication with Friends and Family: More than three times a week    Frequency of Social Gatherings with Friends and Family: More than three times a week    Attends Religious Services: More than 4 times per year    Active Member of [employer] or  Organizations: Yes    Attends Engineer, structural: More than 4 times per year    Marital Status: Married  Catering manager Violence: Not At Risk (06/24/2022)   Humiliation, Afraid, Rape, and Kick questionnaire    Fear of Current or Ex-Partner: No    Emotionally Abused: No    Physically Abused: No    Sexually Abused: No    Outpatient Medications Prior to Visit  Medication Sig Dispense Refill   cetirizine (ZYRTEC) 10 MG  tablet TAKE ONE TABLET BY MOUTH EVERY DAY 90 tablet 3   Cholecalciferol (VITAMIN D3) 50 MCG (2000 UT) capsule TAKE 1 CAPSULE BY MOUTH EVERY DAY 100 capsule 3   [facility] SENNA PLUS 8.6-50 MG tablet Take 1 tablet by mouth 2 (two) times daily.     cyanocobalamin (VITAMIN B12) 1000 MCG/ML injection INJECT 1 ML INTO THE SKIN EVERY 14 DAYS 2 mL 19   diclofenac Sodium (VOLTAREN) 1 % GEL Apply 4 g topically daily as needed (pain).     docusate sodium (COLACE) 100 MG capsule Take 100 mg by mouth at bedtime.     feeding supplement (ENSURE ENLIVE / ENSURE PLUS) LIQD Take 237 mLs by mouth 2 (two) times daily between meals. 14220 mL 0   hydrochlorothiazide (MICROZIDE) 12.5 MG capsule Take 12.5 mg by mouth daily.     hydrOXYzine (VISTARIL) 50 MG capsule Take 1 capsule (50 mg total) by mouth every 6 (six) hours as needed for anxiety.     Multiple Vitamin (MULTIVITAMIN WITH MINERALS) TABS tablet Take 1 tablet by mouth daily.     pantoprazole (PROTONIX) 40 MG tablet Take 1 tablet (40 mg total) by mouth 2 (two) times daily. 120 tablet 0   Pramipexole Dihydrochloride 0.75 MG TB24 Take 1 tablet (0.75 mg total) by mouth in the morning and at bedtime. 180 tablet 1   QUEtiapine (SEROQUEL) 100 MG tablet TAKE ONE TABLET BY MOUTH AT BEDTIME 90 tablet 1   rosuvastatin (CRESTOR) 10 MG tablet TAKE ONE TABLET BY MOUTH EVERY DAY 90 tablet 1   solifenacin (VESICARE) 5 MG tablet TAKE ONE TABLET BY MOUTH EVERY DAY 90 tablet 1   sucralfate (CARAFATE) 1 g tablet Take 1 tablet (1 g total) by mouth 4 (four)  times daily -  with meals and at bedtime. 120 tablet 0   polyethylene glycol (MIRALAX / GLYCOLAX) 17 g packet Take 17 g by mouth daily as needed for mild constipation. (Patient not taking: Reported on 10/29/2022)     potassium chloride SA (KLOR-CON M) 20 MEQ tablet Take 1 tablet (20 mEq total) by mouth 2 (two) times daily for 1 day. 2 tablet 0   No facility-administered medications prior to visit.    Allergies  Allergen Reactions   Ace Inhibitors     Patient doesn't recall reaction.  jkl   Benicar [Olmesartan]     It made her sick   Aspirin Other (See Comments)    bruising   Citalopram Hydrobromide Diarrhea    ROS     Objective:    Physical Exam Constitutional:      General: She is not in acute distress.    Appearance: She is not ill-appearing.  Eyes:     Extraocular Movements: Extraocular movements intact.     Conjunctiva/sclera: Conjunctivae normal.  Cardiovascular:     Rate and Rhythm: Normal rate and regular rhythm.  Pulmonary:     Effort: Pulmonary effort is normal.     Breath sounds: Normal breath sounds.  Abdominal:     General: Bowel sounds are normal. There is no distension.     Palpations: Abdomen is soft.     Tenderness: There is no abdominal tenderness. There is no guarding or rebound.  Musculoskeletal:     Cervical back: Normal range of motion and neck supple.  Skin:    General: Skin is warm and dry.  Neurological:     General: No focal deficit present.     Mental Status: She is alert and oriented  to person, place, and time.  Psychiatric:        Mood and Affect: Mood normal.        Behavior: Behavior normal.      BP 114/72 (BP Location: Left Arm, Patient Position: Sitting, Cuff Size: Large)   Pulse 73   Temp 97.6 F (36.4 C) (Temporal)   Ht 5' (1.524 m)   SpO2 98%   BMI 26.57 kg/m  Wt Readings from Last 3 Encounters:  10/19/22 136 lb 0.4 oz (61.7 kg)  09/24/22 138 lb (62.6 kg)  09/15/22 170 lb (77.1 kg)        Assessment & Plan:    Problem List Items Addressed This Visit       Digestive   Gastric ulcer     Other   Constipation   Leukocytosis   Relevant Orders   CBC with Differential/Platelet (Completed)   Comprehensive metabolic panel (Completed)   Other Visit Diagnoses     Hospital discharge follow-up    -  Primary   Relevant Orders   CBC with Differential/Platelet (Completed)   Comprehensive metabolic panel (Completed)      Reviewed hospital discharge summary and results. Advised her to stop all NSAIDs.  Reports abdominal pain has resolved. Concern is wanting to have more regular bowel movements but denies feeling constipated today.  Discussed using Miralax, staying hydrated, eating fiber and being more active.  Check labs per recommendation.  Medications reconciled. Discussed importance of Protonix.  Follow up with GI and PCP  I am having Yumiko P. Ryans maintain her diclofenac Sodium, docusate sodium, polyethylene glycol, [facility]S Senna Plus, Vitamin D3, cyanocobalamin, Pramipexole Dihydrochloride, solifenacin, QUEtiapine, rosuvastatin, cetirizine, hydrochlorothiazide, pantoprazole, feeding supplement, multivitamin with minerals, potassium chloride SA, hydrOXYzine, and sucralfate.  No orders of the defined types were placed in this encounter.

## 2022-10-29 NOTE — Patient Instructions (Signed)
Please go downstairs for labs.    Start taking Miralax 1 capful in 6 ounces of fluid and do this twice daily for the next week and then you can back down to 1 capful daily to keep your bowels regular.   Avoid taking Celebrex or any other NSAIDs for pain.   Follow up with Dr. Marina Goodell as scheduled.

## 2022-10-29 NOTE — Progress Notes (Signed)
Her potassium level is slightly low. I sent in a prescription for her to take daily for 5 days. Her kidney function is elevated. Make sure to stay hydrated and follow up with PCP regarding this in the next 1-2 months.

## 2022-11-09 ENCOUNTER — Telehealth: Payer: Self-pay | Admitting: Family Medicine

## 2022-11-09 NOTE — Telephone Encounter (Signed)
Pt called wanting to know if she can get pain medication due to her having pain in her legs and all over making it hard to get around. Pt stated that she thinks that its due to her Parkinson's. Please advise.

## 2022-11-09 NOTE — Telephone Encounter (Signed)
Called pt, not able to LM VM full.

## 2022-11-10 NOTE — Telephone Encounter (Signed)
I called pt.  Following up on the message.  She is having leg pain and was taken off the celebrex in ED 09/2022 when in for gastritis.  She has contacted pcp office  (Dr. Posey Rea) who prescribed this and appt made for end of month. I think she said.  We see for PD and she is on pramipexole, and sinemet. GE Dr. Marina Goodell told her that she can take tylenol, but that is not cutting it.  She may call back [facility] and see if anything more then tylenol or may try to call daily pcp and see if cancellations.  She appreciated call back.

## 2022-11-16 ENCOUNTER — Ambulatory Visit (INDEPENDENT_AMBULATORY_CARE_PROVIDER_SITE_OTHER): Payer: Medicare Other | Admitting: Family Medicine

## 2022-11-16 ENCOUNTER — Encounter: Payer: Self-pay | Admitting: Family Medicine

## 2022-11-16 VITALS — BP 138/70 | HR 80 | Temp 98.4°F | Resp 20 | Ht 60.0 in | Wt 126.0 lb

## 2022-11-16 DIAGNOSIS — M159 Polyosteoarthritis, unspecified: Secondary | ICD-10-CM | POA: Diagnosis not present

## 2022-11-16 DIAGNOSIS — Z23 Encounter for immunization: Secondary | ICD-10-CM | POA: Diagnosis not present

## 2022-11-16 MED ORDER — TRAMADOL HCL 50 MG PO TABS
50.0000 mg | ORAL_TABLET | Freq: Three times a day (TID) | ORAL | 0 refills | Status: AC | PRN
Start: 2022-11-16 — End: 2022-11-23

## 2022-11-16 NOTE — Progress Notes (Signed)
Assessment & Plan:  1. Primary osteoarthritis involving multiple joints Uncontrolled pain.  Unable to take NSAIDs and Tylenol is not effective.  Started patient on tramadol and discussed she will need to follow-up with her PCP for long-term management of her chronic pain. - traMADol (ULTRAM) 50 MG tablet; Take 1 tablet (50 mg total) by mouth every 8 (eight) hours as needed for up to 7 days.  Dispense: 21 tablet; Refill: 0  2. Immunization due Flu shot given in office today.   Follow up plan: Return in about 1 week (around 11/23/2022) for chronic pain with PCP.  Deliah Boston, MSN, APRN, [name]-C  Subjective:  HPI: Mallory Klein is a 77 y.o. female presenting on 11/16/2022 for Pain (Joint pain and nerve pain - parkinsons = bnack pain. /Was told to stop Celebrex by GI due to ulcer and anemia. Cydney Ok using Tylenol and it does not help)  Patient is accompanied by her husband, who she is okay with being present.  Patient is here due to generalized joint pain and nerve pain.  This was previously controlled by Celebrex, but she was advised to stop taking it by her gastroenterologist due to gastritis, a peptic ulcer, and anemia.  She has been using Tylenol, which is not helpful.  She does have a history of osteoarthritis, spondylolisthesis, and degenerative disc disease.  She was previously established with a pain clinic and is on a Butrans patch. She stopped going on her own accord as she felt she was doing fine on her current regimen.     ROS: Negative unless specifically indicated above in HPI.   Relevant past medical history reviewed and updated as indicated.   Allergies and medications reviewed and updated.   Current Outpatient Medications:    cetirizine (ZYRTEC) 10 MG tablet, TAKE ONE TABLET BY MOUTH EVERY DAY, Disp: 90 tablet, Rfl: 3   Cholecalciferol (VITAMIN D3) 50 MCG (2000 UT) capsule, TAKE 1 CAPSULE BY MOUTH EVERY DAY, Disp: 100 capsule, Rfl: 3   [employer] SENNA PLUS 8.6-50 MG  tablet, Take 1 tablet by mouth 2 (two) times daily., Disp: , Rfl:    cyanocobalamin (VITAMIN B12) 1000 MCG/ML injection, INJECT 1 ML INTO THE SKIN EVERY 14 DAYS, Disp: 2 mL, Rfl: 19   diclofenac Sodium (VOLTAREN) 1 % GEL, Apply 4 g topically daily as needed (pain)., Disp: , Rfl:    docusate sodium (COLACE) 100 MG capsule, Take 100 mg by mouth at bedtime., Disp: , Rfl:    feeding supplement (ENSURE ENLIVE / ENSURE PLUS) LIQD, Take 237 mLs by mouth 2 (two) times daily between meals., Disp: 14220 mL, Rfl: 0   hydrochlorothiazide (MICROZIDE) 12.5 MG capsule, Take 12.5 mg by mouth daily., Disp: , Rfl:    hydrOXYzine (VISTARIL) 50 MG capsule, Take 1 capsule (50 mg total) by mouth every 6 (six) hours as needed for anxiety., Disp: , Rfl:    Multiple Vitamin (MULTIVITAMIN WITH MINERALS) TABS tablet, Take 1 tablet by mouth daily., Disp: , Rfl:    pantoprazole (PROTONIX) 40 MG tablet, Take 1 tablet (40 mg total) by mouth 2 (two) times daily., Disp: 120 tablet, Rfl: 0   polyethylene glycol (MIRALAX / GLYCOLAX) 17 g packet, Take 17 g by mouth daily as needed for mild constipation., Disp: , Rfl:    potassium chloride SA (KLOR-CON M) 20 MEQ tablet, Take 1 tablet (20 mEq total) by mouth daily for 1 day., Disp: 5 tablet, Rfl: 0   Pramipexole Dihydrochloride 0.75 MG TB24, Take 1 tablet (  0.75 mg total) by mouth in the morning and at bedtime., Disp: 180 tablet, Rfl: 1   QUEtiapine (SEROQUEL) 100 MG tablet, TAKE ONE TABLET BY MOUTH AT BEDTIME, Disp: 90 tablet, Rfl: 1   rosuvastatin (CRESTOR) 10 MG tablet, TAKE ONE TABLET BY MOUTH EVERY DAY, Disp: 90 tablet, Rfl: 1   solifenacin (VESICARE) 5 MG tablet, TAKE ONE TABLET BY MOUTH EVERY DAY, Disp: 90 tablet, Rfl: 1   sucralfate (CARAFATE) 1 g tablet, Take 1 tablet (1 g total) by mouth 4 (four) times daily -  with meals and at bedtime., Disp: 120 tablet, Rfl: 0  Allergies  Allergen Reactions   Ace Inhibitors     Patient doesn't recall reaction.  jkl   Benicar [Olmesartan]      It made her sick   Aspirin Other (See Comments)    bruising   Citalopram Hydrobromide Diarrhea    Objective:   BP 138/70   Pulse 80   Temp 98.4 F (36.9 C)   Resp 20   Ht 5' (1.524 m)   Wt 126 lb (57.2 kg)   BMI 24.61 kg/m    Physical Exam Vitals reviewed.  Constitutional:      General: She is not in acute distress.    Appearance: Normal appearance. She is not ill-appearing, toxic-appearing or diaphoretic.  HENT:     Head: Normocephalic and atraumatic.  Eyes:     General: No scleral icterus.       Right eye: No discharge.        Left eye: No discharge.     Conjunctiva/sclera: Conjunctivae normal.  Cardiovascular:     Rate and Rhythm: Normal rate and regular rhythm.     Heart sounds: Murmur heard.  Pulmonary:     Effort: Pulmonary effort is normal. No respiratory distress.  Musculoskeletal:        General: Normal range of motion.     Cervical back: Normal range of motion.  Skin:    General: Skin is warm and dry.     Capillary Refill: Capillary refill takes less than 2 seconds.  Neurological:     General: No focal deficit present.     Mental Status: She is alert and oriented to person, place, and time. Mental status is at baseline.     Motor: Tremor present.     Gait: Gait abnormal (riding in wheelchair).  Psychiatric:        Mood and Affect: Mood normal.        Behavior: Behavior normal.        Thought Content: Thought content normal.        Judgment: Judgment normal.

## 2022-11-18 ENCOUNTER — Ambulatory Visit (INDEPENDENT_AMBULATORY_CARE_PROVIDER_SITE_OTHER): Payer: Medicare Other | Admitting: Internal Medicine

## 2022-11-18 ENCOUNTER — Encounter: Payer: Self-pay | Admitting: Internal Medicine

## 2022-11-18 VITALS — BP 112/74 | HR 82 | Ht 60.0 in | Wt 127.6 lb

## 2022-11-18 DIAGNOSIS — K254 Chronic or unspecified gastric ulcer with hemorrhage: Secondary | ICD-10-CM

## 2022-11-18 DIAGNOSIS — K222 Esophageal obstruction: Secondary | ICD-10-CM | POA: Diagnosis not present

## 2022-11-18 DIAGNOSIS — D500 Iron deficiency anemia secondary to blood loss (chronic): Secondary | ICD-10-CM

## 2022-11-18 DIAGNOSIS — K2100 Gastro-esophageal reflux disease with esophagitis, without bleeding: Secondary | ICD-10-CM | POA: Diagnosis not present

## 2022-11-18 NOTE — Progress Notes (Signed)
HISTORY OF PRESENT ILLNESS:  Mallory Klein is a 77 y.o. female with multiple medical problems, including Parkinson's disease, who is seen in the hospital by GI regarding abdominal pain, abnormal CT scan of the stomach, anemia, and weight loss.  She subsequently underwent upper endoscopy October 19, 2022 by myself.  She was found to have erosive esophagitis, peptic stricture, and a large bulb/pyloric channel ulcer.  Biopsies were negative for malignancy.  She was treated with PPI therapy and monitored.  She was discharged home October 21, 2022.  Told to stay on twice daily pantoprazole.  Told to avoid NSAIDs such as Celebrex.  She follows up at this time.  She did undergo CBC October 29, 2022.  Her hemoglobin was 10.6 (9.6 at discharge in the hospital 2 weeks earlier).  She is accompanied today by her husband.  She reports that she had been taking Celebrex at home for chronic pain.  No longer.  We think that she is taking her pantoprazole twice daily, but need to confirm.  Overall she has had resolution of her abdominal pain.  Her bowels are normal colored.  Appetite slightly improved.  No new complaints.  She has questions regarding ulcer disease.    REVIEW OF SYSTEMS:  All non-GI ROS negative unless otherwise stated in the HPI except for back pain, leg pain, tremor  Past Medical History:  Diagnosis Date   Abnormal CBC 02/05/2015   Dr Mosetta Putt 12/16 new: normocytic anemia and thrombocytosis   Adjustment disorder with mixed anxiety and depressed mood 02/15/2007   Chronic  Chronic pain Grief, stress Effexor XR  Dad died in 02/17/23   Asthma    Diverticulosis of colon (without mention of hemorrhage)    Esophageal stricture    Essential hypertension 01/26/2007   Chronic Verapamil    Falls    "I blackout"   GERD (gastroesophageal reflux disease) 02/15/2007   Chronic     Heart murmur    Hyperglycemia 03/02/2011   Mild     Insomnia disorder 12/08/2018   10/20 Carbid/Lev dose was increased - c/o  hard time falling asleep (6 am) getting up at 12 am, poor sleep. Try Temazepam 15-30 mg at 11-1:30 pm    Internal hemorrhoids without mention of complication    Iron deficiency anemia 04/23/2017   Mild neurocognitive disorder, likely due to Parkinson's disease 02/09/2019   OSA (obstructive sleep apnea) 02/14/2010   In lab study (May 2018): AHI 25. Only had 45 minutes of sleep secondary to frequent awakenings secondary to sleep apnea. autocpap 5-15 cm water.    Osteoarthritis    Parkinson disease 02/16/14   2015 2017  Primidone - d/c 2019 Parkinson's: [facility] IR. Dr Tat   Postherpetic trigeminal neuralgia 01/26/2007   Qualifier: Diagnosis of  By: Charlsie Quest RMA, [name]     Shortness of breath dyspnea    with allergies   Thrombocytosis 05/24/2017   Urinary incontinence 01/26/2007   Chronic  10/17 Detrol [place]    Vitamin D deficiency 01/26/2019    Past Surgical History:  Procedure Laterality Date   ABDOMINAL HYSTERECTOMY     BIOPSY  10/19/2022   Procedure: BIOPSY;  Surgeon: Hilarie Fredrickson, MD;  Location: [facility];  Service: Gastroenterology;;   BREAST CYST EXCISION Left 10/25/1978   COLONOSCOPY     Multiple   ESOPHAGOGASTRODUODENOSCOPY (EGD) WITH PROPOFOL N/A 10/19/2022   Procedure: ESOPHAGOGASTRODUODENOSCOPY (EGD) WITH PROPOFOL;  Surgeon: Hilarie Fredrickson, MD;  Location: [facility];  Service: Gastroenterology;  Laterality: N/A;  LUMBAR LAMINECTOMY/DECOMPRESSION MICRODISCECTOMY Bilateral 02/20/2014   Procedure: LUMBAR TWO THREE, LUMBAR THREE FOUR LUMBAR LAMINECTOMY/DECOMPRESSION MICRODISCECTOMY 2 LEVELS;  Surgeon: Tia Alert, MD;  Location: [facility];  Service: Neurosurgery;  Laterality: Bilateral;   SPLENECTOMY     TIBIA FRACTURE SURGERY Right    TIBIA FRACTURE SURGERY Left    TOTAL KNEE ARTHROPLASTY Right 02/24/2011    Social History HATTYE CASTER  reports that she has never smoked. She has never used smokeless tobacco. She reports that she does not currently use alcohol. She  reports that she does not use drugs.  family history includes Asthma in her mother; Dementia in her mother; Diabetes in her mother; Diabetes type II in her mother; Hypertension in her mother; Mental illness in her father; Prostate cancer in her father; Stroke in her brother; Suicidality in her son.  Allergies  Allergen Reactions   Ace Inhibitors     Patient doesn't recall reaction.  jkl   Benicar [Olmesartan]     It made her sick   Aspirin Other (See Comments)    bruising   Citalopram Hydrobromide Diarrhea       PHYSICAL EXAMINATION: Vital signs: BP 112/74   Pulse 82   Ht 5' (1.524 m)   Wt 127 lb 9.6 oz (57.9 kg)   BMI 24.92 kg/m   Constitutional: Pleasant, chronically ill-appearing, no acute distress.  In wheelchair Psychiatric: alert and oriented x3, cooperative Eyes: extraocular movements intact, anicteric, conjunctiva pink Mouth: oral pharynx moist, no lesions Neck: supple no lymphadenopathy Cardiovascular: heart regular rate and rhythm, no murmur Lungs: clear to auscultation bilaterally Abdomen: soft, nontender, nondistended, no obvious ascites, no peritoneal signs, normal bowel sounds, no organomegaly Rectal: Omitted Extremities: no clubbing, cyanosis, or lower extremity edema bilaterally Skin: no lesions on visible extremities Neuro: No focal deficits.  Obvious parkinsonian tremor  ASSESSMENT:  1.  Recent hospitalization for abdominal pain, anemia, weight loss, and abnormal CT scan.  Found to have giant ulcer in the region of the distal antrum/pylorus/bulb.  Negative superficial biopsies.  On, we believe, pantoprazole 40 mg twice daily.  Off Celebrex 2.  Anemia.  Hemoglobin improved since hospital discharge 3.  Multiple medical problems.  Stable   PLAN:  1.  We will confirm that she is on pantoprazole 40 mg twice daily.  My CMA will call her tomorrow.  If she is on 40 mg twice daily, continue.  If not, we will make sure that she is taking this drug as  recommended. 2.  Schedule upper endoscopy to reassess GI gastric ulcer.  This in about 4 weeks (8 weeks from index endoscopy).The nature of the procedure, as well as the risks, benefits, and alternatives were carefully and thoroughly reviewed with the patient. Ample time for discussion and questions allowed. The patient understood, was satisfied, and agreed to proceed. 3.  Ongoing general medical care with Dr. Posey Rea A total time of 30 minutes were spent.  See the patient, obtaining interval history, performing medically appropriate physical exam, counseling educating the patient and her husband regarding the above listed issues, ordering upper endoscopy, and documenting clinical information in the health record

## 2022-11-18 NOTE — Patient Instructions (Signed)
You have been scheduled for an endoscopy. Please follow written instructions given to you at your visit today.  If you use inhalers (even only as needed), please bring them with you on the day of your procedure.  If you take any of the following medications, they will need to be adjusted prior to your procedure:   DO NOT TAKE 7 DAYS PRIOR TO TEST- Trulicity (dulaglutide) Ozempic, Wegovy (semaglutide) Mounjaro (tirzepatide) Bydureon Bcise (exanatide extended release)  DO NOT TAKE 1 DAY PRIOR TO YOUR TEST Rybelsus (semaglutide) Adlyxin (lixisenatide) Victoza (liraglutide) Byetta (exanatide) ___________________________________________________________________________   _______________________________________________________  If your blood pressure at your visit was 140/90 or greater, please contact your primary care physician to follow up on this.  _______________________________________________________  If you are age 15 or older, your body mass index should be between 23-30. Your Body mass index is 24.92 kg/m. If this is out of the aforementioned range listed, please consider follow up with your Primary Care Provider.  If you are age 24 or younger, your body mass index should be between 19-25. Your Body mass index is 24.92 kg/m. If this is out of the aformentioned range listed, please consider follow up with your Primary Care Provider.   ________________________________________________________  The [facility] providers would like to encourage you to use [facility] to communicate with providers for non-urgent requests or questions.  Due to long hold times on the telephone, sending your provider a message by [facility] may be a faster and more efficient way to get a response.  Please allow 48 business hours for a response.  Please remember that this is for non-urgent requests.  _______________________________________________________

## 2022-11-23 ENCOUNTER — Ambulatory Visit: Payer: Medicare Other | Admitting: Internal Medicine

## 2022-11-26 ENCOUNTER — Encounter: Payer: Self-pay | Admitting: Internal Medicine

## 2022-11-26 ENCOUNTER — Ambulatory Visit: Payer: Medicare Other | Admitting: Internal Medicine

## 2022-11-26 VITALS — BP 118/70 | HR 51 | Temp 98.0°F | Ht 60.0 in | Wt 127.0 lb

## 2022-11-26 DIAGNOSIS — E538 Deficiency of other specified B group vitamins: Secondary | ICD-10-CM

## 2022-11-26 DIAGNOSIS — G8929 Other chronic pain: Secondary | ICD-10-CM | POA: Diagnosis not present

## 2022-11-26 DIAGNOSIS — R269 Unspecified abnormalities of gait and mobility: Secondary | ICD-10-CM

## 2022-11-26 DIAGNOSIS — I10 Essential (primary) hypertension: Secondary | ICD-10-CM

## 2022-11-26 DIAGNOSIS — G20A1 Parkinson's disease without dyskinesia, without mention of fluctuations: Secondary | ICD-10-CM | POA: Diagnosis not present

## 2022-11-26 DIAGNOSIS — M5440 Lumbago with sciatica, unspecified side: Secondary | ICD-10-CM | POA: Diagnosis not present

## 2022-11-26 MED ORDER — METHOCARBAMOL 500 MG PO TABS: 500.0000 mg | ORAL_TABLET | Freq: Three times a day (TID) | ORAL | 2 refills | Status: DC | PRN

## 2022-11-26 MED ORDER — TRAMADOL HCL 50 MG PO TABS: 50.0000 mg | ORAL_TABLET | Freq: Three times a day (TID) | ORAL | 3 refills | Status: DC | PRN

## 2022-11-26 NOTE — Assessment & Plan Note (Signed)
No NSAIDs due to gastric ulcer Cont w/Tramadol 50 mg tid prn Blue-Emu cream was recommended to use 2-3 times a day

## 2022-11-26 NOTE — Assessment & Plan Note (Signed)
The pt wants to switch from Dr Frances Furbish

## 2022-11-26 NOTE — Progress Notes (Addendum)
Subjective:  Patient ID: Mallory Klein, female    DOB: [birth year]  Age: 77 y.o. MRN: [id number]  CC: Pain (F/U on recent ov with Deliah Boston)   HPI Mallory Klein presents for chronic OA w/pain, gastric ulcer Britney started [place] on Tramadol. No NSAIDs   Per hx: " Admit date:     10/17/2022  Discharge date: 10/21/2022  Discharge Physician: Jacquelin Hawking, MD    PCP: Tresa Garter, MD    Recommendations at discharge:  PCP follow-up GI follow-up in 6-8 weeks for repeat endoscopy CBC in 3 to 5 days   Discharge Diagnoses: Principal Problem:   Gastric ulcer Active Problems:   Peptic ulcer disease   Leukocytosis   Anemia   Abnormal CT of the abdomen   Malnutrition of moderate degree   Resolved Problems:   * No resolved hospital problems. *   Hospital Course: Mallory Klein is a 77 y.o. female with a history of diverticulosis, hypertension, Parkinson disease, anxiety, hyperlipidemia, overactive bladder.  Patient presented secondary to intermittent epigastric and lower anterior chest area discomfort.  Patient evaluated by gastroenterology and was found to have evidence of gastric ulcer on imaging and upper endoscopy.  Recommendation to discontinue all NSAIDs.  Patient to follow-up with GI as an outpatient for repeat endoscopy.   Assessment and Plan:   Gastric ulcer Patient presenting with abdominal pain.  CT abdomen/pelvis suggestive of gastric ulcer.  Patient started on Protonix twice daily and Carafate.  GI was consulted and performed an upper endoscopy on 8/26 which was significant for erosive esophagitis, peptic stricture and a large prepyloric/pyloric/proximal bulb ulcer.  Biopsy obtained without etiology for ulceration identified.  GI recommendation to discontinue Celebrex, avoid all NSAIDs, continue Protonix and Carafate and follow-up with GI for repeat endoscopy in 6 to 8 weeks.   Constipation/diarrhea Stable.   Mild hyponatremia Mild.   Hydrochlorothiazide was held on admission.  Resolved.   Leukocytosis White blood cell count of 19,400 on day of admission.   Acute anemia Mild.  Possibly related to gastric ulcer and possible bleeding.  Workup negative for hemolytic anemia.  Repeat CBC as an outpatient   Parkinson disease Continue pramipexole.   Depression Continue Seroquel.   Anxiety Continue hydroxyzine as needed.   Hyperlipidemia Continue Crestor.   Overactive bladder Continue Vesicare     Consultants: Mallory Klein GI Procedures performed: Upper GI endoscopy  Disposition: Home Diet recommendation: Regular diet"  Outpatient Medications Prior to Visit  Medication Sig Dispense Refill   cetirizine (ZYRTEC) 10 MG tablet TAKE ONE TABLET BY MOUTH EVERY DAY 90 tablet 3   Cholecalciferol (VITAMIN D3) 50 MCG (2000 UT) capsule TAKE 1 CAPSULE BY MOUTH EVERY DAY 100 capsule 3   [facility] SENNA PLUS 8.6-50 MG tablet Take 1 tablet by mouth 2 (two) times daily.     cyanocobalamin (VITAMIN B12) 1000 MCG/ML injection INJECT 1 ML INTO THE SKIN EVERY 14 DAYS 2 mL 19   diclofenac Sodium (VOLTAREN) 1 % GEL Apply 4 g topically daily as needed (pain).     docusate sodium (COLACE) 100 MG capsule Take 100 mg by mouth at bedtime.     hydrochlorothiazide (MICROZIDE) 12.5 MG capsule Take 12.5 mg by mouth daily.     hydrOXYzine (VISTARIL) 50 MG capsule Take 1 capsule (50 mg total) by mouth every 6 (six) hours as needed for anxiety.     Multiple Vitamin (MULTIVITAMIN WITH MINERALS) TABS tablet Take 1 tablet by mouth daily.     pantoprazole (  PROTONIX) 40 MG tablet Take 1 tablet (40 mg total) by mouth 2 (two) times daily. 120 tablet 0   polyethylene glycol (MIRALAX / GLYCOLAX) 17 g packet Take 17 g by mouth daily as needed for mild constipation.     Pramipexole Dihydrochloride 0.75 MG TB24 Take 1 tablet (0.75 mg total) by mouth in the morning and at bedtime. 180 tablet 1   QUEtiapine (SEROQUEL) 100 MG tablet TAKE ONE TABLET BY MOUTH AT BEDTIME 90  tablet 1   solifenacin (VESICARE) 5 MG tablet TAKE ONE TABLET BY MOUTH EVERY DAY 90 tablet 1   rosuvastatin (CRESTOR) 10 MG tablet TAKE ONE TABLET BY MOUTH EVERY DAY 90 tablet 1   traMADol (ULTRAM) 50 MG tablet Take 50 mg by mouth every 8 (eight) hours as needed for moderate pain.     potassium chloride SA (KLOR-CON M) 20 MEQ tablet Take 1 tablet (20 mEq total) by mouth daily for 1 day. 5 tablet 0   sucralfate (CARAFATE) 1 g tablet Take 1 tablet (1 g total) by mouth 4 (four) times daily -  with meals and at bedtime. 120 tablet 0   No facility-administered medications prior to visit.    ROS: Review of Systems  Objective:  BP 118/70 (BP Location: Left Arm, Patient Position: Sitting, Cuff Size: Normal)   Pulse (!) 51   Temp 98 F (36.7 C) (Oral)   Ht 5' (1.524 m)   Wt 127 lb (57.6 kg)   SpO2 97%   BMI 24.80 kg/m   BP Readings from Last 3 Encounters:  11/26/22 118/70  11/18/22 112/74  11/16/22 138/70    Wt Readings from Last 3 Encounters:  11/26/22 127 lb (57.6 kg)  11/18/22 127 lb 9.6 oz (57.9 kg)  11/16/22 126 lb (57.2 kg)    Physical Exam  Lab Results  Component Value Date   WBC 7.8 10/29/2022   HGB 10.6 (L) 10/29/2022   HCT 32.7 (L) 10/29/2022   PLT 492.0 (H) 10/29/2022   GLUCOSE 93 10/29/2022   CHOL 200 (H) 01/07/2021   TRIG 53 01/07/2021   HDL 79 01/07/2021   LDLDIRECT 109.8 02/10/2010   LDLCALC 111 (H) 01/07/2021   ALT 11 10/29/2022   AST 21 10/29/2022   NA 139 10/29/2022   K 3.3 (L) 10/29/2022   CL 99 10/29/2022   CREATININE 1.40 (H) 10/29/2022   BUN 16 10/29/2022   CO2 38 (H) 10/29/2022   TSH 0.55 06/25/2022   INR 1.2 10/18/2022   HGBA1C 6.1 06/25/2022    CT ABDOMEN PELVIS W CONTRAST  Result Date: 10/17/2022 CLINICAL DATA:  Acute left upper abdominal pain EXAM: CT ABDOMEN AND PELVIS WITH CONTRAST TECHNIQUE: Multidetector CT imaging of the abdomen and pelvis was performed using the standard protocol following bolus administration of intravenous  contrast. RADIATION DOSE REDUCTION: This exam was performed according to the departmental dose-optimization program which includes automated exposure control, adjustment of the mA and/or kV according to patient size and/or use of iterative reconstruction technique. CONTRAST:  75mL OMNIPAQUE IOHEXOL 350 MG/ML SOLN COMPARISON:  09/15/2022 FINDINGS: Lower chest: Mild cardiomegaly. Hepatobiliary: Unremarkable Pancreas: Atrophic pancreas with borderline dilatation of the dorsal pancreatic duct. Otherwise unremarkable. Spleen: Absent aside from small regenerative splenic lesions, compatible with splenectomy. Adrenals/Urinary Tract: Benign bilateral renal cysts. No further imaging workup of these lesions is indicated. Adrenal glands unremarkable.  Urinary bladder appears unremarkable. Stomach/Bowel: Abnormal wall thickening in the gastric antrum compatible with gastritis. Suspected large cephalad ulcer of the stomach on image 60  series 5. Underlying gastric malignancy not excluded. Normal appendix.  No dilated bowel. Vascular/Lymphatic: Atherosclerosis is present, including aortoiliac atherosclerotic disease. Reproductive: Uterus absent. Other: No supplemental non-categorized findings. Musculoskeletal: Moderate degenerative arthropathy of both hips. Lower thoracic and lumbar spondylosis and degenerative disc disease causing multilevel impingement. Previous posterior decompression at L3. IMPRESSION: 1. Abnormal wall thickening in the gastric antrum suggesting potentially severe gastritis. Suspected large cephalad ulcer of the stomach antrum. No current free intraperitoneal gas. Underlying gastric malignancy not excluded. Follow up endoscopy is recommended. 2. Aortic atherosclerosis. 3. Mild cardiomegaly. 4. Moderate degenerative arthropathy of both hips. 5. Lower thoracic and lumbar spondylosis and degenerative disc disease causing multilevel impingement. Aortic Atherosclerosis (ICD10-I70.0). Electronically Signed   By:  Gaylyn Rong M.D.   On: 10/17/2022 13:29   CT Angio Chest PE W and/or Wo Contrast  Result Date: 10/17/2022 CLINICAL DATA:  PE suspected, left chest pain EXAM: CT ANGIOGRAPHY CHEST WITH CONTRAST TECHNIQUE: Multidetector CT imaging of the chest was performed using the standard protocol during bolus administration of intravenous contrast. Multiplanar CT image reconstructions and MIPs were obtained to evaluate the vascular anatomy. RADIATION DOSE REDUCTION: This exam was performed according to the departmental dose-optimization program which includes automated exposure control, adjustment of the mA and/or kV according to patient size and/or use of iterative reconstruction technique. CONTRAST:  75mL OMNIPAQUE IOHEXOL 350 MG/ML SOLN COMPARISON:  None Available. FINDINGS: Cardiovascular: Satisfactory opacification of the pulmonary arteries to the segmental level. No evidence of pulmonary embolism. Cardiomegaly. No pericardial effusion. Aortic atherosclerosis Mediastinum/Nodes: No enlarged mediastinal, hilar, or axillary lymph nodes. Thyroid gland, trachea, and esophagus demonstrate no significant findings. Lungs/Pleura: Trace pleural effusions.  No acute airspace opacity. Upper Abdomen: Please see separately reported examination of the abdomen pelvis Musculoskeletal: No chest wall abnormality. No acute osseous findings. Review of the MIP images confirms the above findings. IMPRESSION: 1. Negative examination for pulmonary embolism. 2. Trace pleural effusions. No acute airspace opacity. 3. Cardiomegaly. Aortic Atherosclerosis (ICD10-I70.0). Electronically Signed   By: Jearld Lesch M.D.   On: 10/17/2022 13:20   DG Chest Port 1 View  Result Date: 10/17/2022 CLINICAL DATA:  Left-sided chest pain EXAM: PORTABLE CHEST 1 VIEW COMPARISON:  05/12/2016 FINDINGS: Cardiomegaly. Unchanged elevation of the left hemidiaphragm. Both lungs are clear. The visualized skeletal structures are unremarkable. IMPRESSION:  Cardiomegaly without acute abnormality of the lungs in AP portable projection. Electronically Signed   By: Jearld Lesch M.D.   On: 10/17/2022 12:18    Assessment & Plan:   Problem List Items Addressed This Visit     Essential hypertension    BP Readings from Last 3 Encounters:  11/26/22 118/70  11/18/22 112/74  11/16/22 138/70         Low back pain - Primary    No NSAIDs due to gastric ulcer Cont w/Tramadol 50 mg tid prn Blue-Emu cream was recommended to use 2-3 times a day       Relevant Medications   methocarbamol (ROBAXIN) 500 MG tablet   traMADol (ULTRAM) 50 MG tablet   Parkinson disease (HCC)     The pt wants to switch from Dr Frances Furbish       Relevant Orders   Ambulatory referral to Neurology   B12 deficiency    On B12      Gait disorder    PT offered and declined         Meds ordered this encounter  Medications   methocarbamol (ROBAXIN) 500 MG tablet    Sig: Take  1 tablet (500 mg total) by mouth every 8 (eight) hours as needed for muscle spasms.    Dispense:  60 tablet    Refill:  2   traMADol (ULTRAM) 50 MG tablet    Sig: Take 1 tablet (50 mg total) by mouth every 8 (eight) hours as needed for severe pain.    Dispense:  90 tablet    Refill:  3      Follow-up: Return in about 3 months (around 02/26/2023) for a follow-up visit.  Sonda Primes, MD

## 2022-11-26 NOTE — Assessment & Plan Note (Signed)
BP Readings from Last 3 Encounters:  11/26/22 118/70  11/18/22 112/74  11/16/22 138/70

## 2022-11-26 NOTE — Assessment & Plan Note (Signed)
PT offered and declined

## 2022-11-26 NOTE — Patient Instructions (Signed)
Blue-Emu cream -- use 2-3 times a day ? ?

## 2022-11-26 NOTE — Assessment & Plan Note (Signed)
On B12 

## 2022-12-04 ENCOUNTER — Other Ambulatory Visit: Payer: Self-pay | Admitting: Internal Medicine

## 2022-12-10 ENCOUNTER — Other Ambulatory Visit (HOSPITAL_COMMUNITY): Payer: Self-pay

## 2022-12-11 ENCOUNTER — Encounter: Payer: Self-pay | Admitting: Internal Medicine

## 2022-12-21 ENCOUNTER — Encounter: Payer: Self-pay | Admitting: Internal Medicine

## 2022-12-21 ENCOUNTER — Ambulatory Visit (INDEPENDENT_AMBULATORY_CARE_PROVIDER_SITE_OTHER): Payer: Medicare Other | Admitting: Internal Medicine

## 2022-12-21 VITALS — BP 136/84 | HR 79 | Temp 98.0°F | Ht 60.0 in

## 2022-12-21 DIAGNOSIS — R7989 Other specified abnormal findings of blood chemistry: Secondary | ICD-10-CM | POA: Diagnosis not present

## 2022-12-21 DIAGNOSIS — I10 Essential (primary) hypertension: Secondary | ICD-10-CM

## 2022-12-21 DIAGNOSIS — N183 Chronic kidney disease, stage 3 unspecified: Secondary | ICD-10-CM

## 2022-12-21 DIAGNOSIS — M5440 Lumbago with sciatica, unspecified side: Secondary | ICD-10-CM

## 2022-12-21 DIAGNOSIS — G8929 Other chronic pain: Secondary | ICD-10-CM

## 2022-12-21 DIAGNOSIS — E538 Deficiency of other specified B group vitamins: Secondary | ICD-10-CM

## 2022-12-21 DIAGNOSIS — K2970 Gastritis, unspecified, without bleeding: Secondary | ICD-10-CM

## 2022-12-21 DIAGNOSIS — K259 Gastric ulcer, unspecified as acute or chronic, without hemorrhage or perforation: Secondary | ICD-10-CM

## 2022-12-21 DIAGNOSIS — N2889 Other specified disorders of kidney and ureter: Secondary | ICD-10-CM

## 2022-12-21 LAB — CBC WITH DIFFERENTIAL/PLATELET
Basophils Absolute: 0.1 10*3/uL (ref 0.0–0.1)
Basophils Relative: 1 % (ref 0.0–3.0)
Eosinophils Absolute: 0.3 10*3/uL (ref 0.0–0.7)
Eosinophils Relative: 4.2 % (ref 0.0–5.0)
HCT: 36.6 % (ref 36.0–46.0)
Hemoglobin: 11.4 g/dL — ABNORMAL LOW (ref 12.0–15.0)
Lymphocytes Relative: 32 % (ref 12.0–46.0)
Lymphs Abs: 2.4 10*3/uL (ref 0.7–4.0)
MCHC: 31.2 g/dL (ref 30.0–36.0)
MCV: 89.5 fL (ref 78.0–100.0)
Monocytes Absolute: 0.7 10*3/uL (ref 0.1–1.0)
Monocytes Relative: 9.1 % (ref 3.0–12.0)
Neutro Abs: 3.9 10*3/uL (ref 1.4–7.7)
Neutrophils Relative %: 53.7 % (ref 43.0–77.0)
Platelets: 373 10*3/uL (ref 150.0–400.0)
RBC: 4.09 Mil/uL (ref 3.87–5.11)
RDW: 15.8 % — ABNORMAL HIGH (ref 11.5–15.5)
WBC: 7.4 10*3/uL (ref 4.0–10.5)

## 2022-12-21 LAB — COMPREHENSIVE METABOLIC PANEL
ALT: 9 U/L (ref 0–35)
AST: 18 U/L (ref 0–37)
Albumin: 3.9 g/dL (ref 3.5–5.2)
Alkaline Phosphatase: 56 U/L (ref 39–117)
BUN: 16 mg/dL (ref 6–23)
CO2: 34 meq/L — ABNORMAL HIGH (ref 19–32)
Calcium: 9.8 mg/dL (ref 8.4–10.5)
Chloride: 100 meq/L (ref 96–112)
Creatinine, Ser: 1.19 mg/dL (ref 0.40–1.20)
GFR: 44.25 mL/min — ABNORMAL LOW (ref >60.00)
Glucose, Bld: 88 mg/dL (ref 70–99)
Potassium: 4.5 meq/L (ref 3.5–5.1)
Sodium: 140 meq/L (ref 135–145)
Total Bilirubin: 0.5 mg/dL (ref 0.2–1.2)
Total Protein: 7.6 g/dL (ref 6.0–8.3)

## 2022-12-21 MED ORDER — TRAMADOL HCL 50 MG PO TABS: 50.0000 mg | ORAL_TABLET | Freq: Three times a day (TID) | ORAL | 3 refills | Status: DC | PRN

## 2022-12-21 MED ORDER — CYANOCOBALAMIN 1000 MCG/ML IJ SOLN
1000.0000 ug | Freq: Once | INTRAMUSCULAR | Status: AC
Start: 2022-12-21 — End: 2022-12-21
  Administered 2022-12-21: 1000 ug via INTRAMUSCULAR

## 2022-12-21 MED ORDER — PANTOPRAZOLE SODIUM 40 MG PO TBEC: 40.0000 mg | DELAYED_RELEASE_TABLET | Freq: Two times a day (BID) | ORAL | 1 refills | Status: DC

## 2022-12-21 MED ORDER — VITAMIN D3 50 MCG (2000 UT) PO CAPS: 2000.0000 [IU] | ORAL_CAPSULE | Freq: Every day | ORAL | 3 refills | Status: DC

## 2022-12-21 NOTE — Assessment & Plan Note (Signed)
No NSAIDs due to gastric ulcer Increase Tramadol to 50-100 mg tid prn Blue-Emu cream was recommended to use 2-3 times a day

## 2022-12-21 NOTE — Assessment & Plan Note (Signed)
Hydrate well 

## 2022-12-21 NOTE — Assessment & Plan Note (Signed)
I re-sent Protonix 40 mg bid Rx Hope Sanyla has been taking it - check at home EGD pending

## 2022-12-21 NOTE — Addendum Note (Signed)
Addended by: Cathleen Fears, Rilyn Upshaw P on: 12/21/2022 02:13 PM   Modules accepted: Orders

## 2022-12-21 NOTE — Assessment & Plan Note (Signed)
I re-sent Protonix 40 mg bid Rx Hope Anzlee has been taking it - check at home

## 2022-12-21 NOTE — Assessment & Plan Note (Signed)
On B12 

## 2022-12-21 NOTE — Progress Notes (Signed)
Subjective:  Patient ID: Mallory Klein, female    DOB: [birth year]  Age: 77 y.o. MRN: [id number]  CC: Medical Management of Chronic Issues (3 month follow up, want alternative medication for pain was prescribed tramadol 50 mg and methocarbamol 500mg   which is not helping )   HPI ALEIA MORROBEL presents for gastric ulcer, chronic pain, Parkinson's disease Tramadol is helping some, not today  Outpatient Medications Prior to Visit  Medication Sig Dispense Refill   cetirizine (ZYRTEC) 10 MG tablet TAKE ONE TABLET BY MOUTH EVERY DAY 90 tablet 3   Cholecalciferol (VITAMIN D3) 50 MCG (2000 UT) capsule TAKE 1 CAPSULE BY MOUTH EVERY DAY 100 capsule 3   [employer] SENNA PLUS 8.6-50 MG tablet Take 1 tablet by mouth 2 (two) times daily.     cyanocobalamin (VITAMIN B12) 1000 MCG/ML injection INJECT 1 ML INTO THE SKIN EVERY 14 DAYS 2 mL 19   diclofenac Sodium (VOLTAREN) 1 % GEL Apply 4 g topically daily as needed (pain).     docusate sodium (COLACE) 100 MG capsule Take 100 mg by mouth at bedtime.     hydrochlorothiazide (MICROZIDE) 12.5 MG capsule Take 12.5 mg by mouth daily.     hydrOXYzine (VISTARIL) 50 MG capsule Take 1 capsule (50 mg total) by mouth every 6 (six) hours as needed for anxiety.     methocarbamol (ROBAXIN) 500 MG tablet Take 1 tablet (500 mg total) by mouth every 8 (eight) hours as needed for muscle spasms. 60 tablet 2   Multiple Vitamin (MULTIVITAMIN WITH MINERALS) TABS tablet Take 1 tablet by mouth daily.     polyethylene glycol (MIRALAX / GLYCOLAX) 17 g packet Take 17 g by mouth daily as needed for mild constipation.     Pramipexole Dihydrochloride 0.75 MG TB24 Take 1 tablet (0.75 mg total) by mouth in the morning and at bedtime. 180 tablet 1   QUEtiapine (SEROQUEL) 100 MG tablet TAKE ONE TABLET BY MOUTH AT BEDTIME 90 tablet 1   rosuvastatin (CRESTOR) 10 MG tablet TAKE ONE TABLET BY MOUTH EVERY DAY 90 tablet 1   solifenacin (VESICARE) 5 MG tablet TAKE ONE TABLET BY MOUTH EVERY DAY  90 tablet 1   traMADol (ULTRAM) 50 MG tablet Take 1 tablet (50 mg total) by mouth every 8 (eight) hours as needed for severe pain. 90 tablet 3   potassium chloride SA (KLOR-CON M) 20 MEQ tablet Take 1 tablet (20 mEq total) by mouth daily for 1 day. 5 tablet 0   sucralfate (CARAFATE) 1 g tablet Take 1 tablet (1 g total) by mouth 4 (four) times daily -  with meals and at bedtime. 120 tablet 0   pantoprazole (PROTONIX) 40 MG tablet Take 1 tablet (40 mg total) by mouth 2 (two) times daily. 120 tablet 0   No facility-administered medications prior to visit.    ROS: Review of Systems  Constitutional:  Positive for fatigue. Negative for activity change, appetite change, chills and unexpected weight change.  HENT:  Negative for congestion, mouth sores and sinus pressure.   Eyes:  Negative for visual disturbance.  Respiratory:  Negative for cough and chest tightness.   Gastrointestinal:  Negative for abdominal pain and nausea.  Genitourinary:  Negative for difficulty urinating, frequency and vaginal pain.  Musculoskeletal:  Positive for arthralgias, back pain and gait problem.  Skin:  Negative for pallor and rash.  Neurological:  Positive for tremors and weakness. Negative for dizziness, numbness and headaches.  Psychiatric/Behavioral:  Negative for confusion, sleep disturbance  and suicidal ideas. The patient is nervous/anxious.     Objective:  BP 136/84   Pulse 79   Temp 98 F (36.7 C) (Temporal)   Ht 5' (1.524 m)   SpO2 97%   BMI 24.80 kg/m   BP Readings from Last 3 Encounters:  12/21/22 136/84  11/26/22 118/70  11/18/22 112/74    Wt Readings from Last 3 Encounters:  11/26/22 127 lb (57.6 kg)  11/18/22 127 lb 9.6 oz (57.9 kg)  11/16/22 126 lb (57.2 kg)    Physical Exam Constitutional:      General: She is not in acute distress.    Appearance: Normal appearance. She is well-developed.  HENT:     Head: Normocephalic.     Right Ear: External ear normal.     Left Ear:  External ear normal.     Nose: Nose normal.  Eyes:     General:        Right eye: No discharge.        Left eye: No discharge.     Conjunctiva/sclera: Conjunctivae normal.     Pupils: Pupils are equal, round, and reactive to light.  Neck:     Thyroid: No thyromegaly.     Vascular: No JVD.     Trachea: No tracheal deviation.  Cardiovascular:     Rate and Rhythm: Normal rate and regular rhythm.     Heart sounds: Normal heart sounds.  Pulmonary:     Effort: No respiratory distress.     Breath sounds: No stridor. No wheezing.  Abdominal:     General: Bowel sounds are normal. There is no distension.     Palpations: Abdomen is soft. There is no mass.     Tenderness: There is no abdominal tenderness. There is no guarding or rebound.  Musculoskeletal:        General: Tenderness present.     Cervical back: Normal range of motion and neck supple. No rigidity.     Right lower leg: No edema.     Left lower leg: No edema.  Lymphadenopathy:     Cervical: No cervical adenopathy.  Skin:    Findings: No erythema or rash.  Neurological:     Mental Status: She is oriented to person, place, and time.     Cranial Nerves: No cranial nerve deficit.     Motor: No abnormal muscle tone.     Coordination: Coordination abnormal.     Gait: Gait abnormal.     Deep Tendon Reflexes: Reflexes normal.  Psychiatric:        Behavior: Behavior normal.        Thought Content: Thought content normal.        Judgment: Judgment normal.   In a w/c Tremors  Lab Results  Component Value Date   WBC 7.8 10/29/2022   HGB 10.6 (L) 10/29/2022   HCT 32.7 (L) 10/29/2022   PLT 492.0 (H) 10/29/2022   GLUCOSE 93 10/29/2022   CHOL 200 (H) 01/07/2021   TRIG 53 01/07/2021   HDL 79 01/07/2021   LDLDIRECT 109.8 02/10/2010   LDLCALC 111 (H) 01/07/2021   ALT 11 10/29/2022   AST 21 10/29/2022   NA 139 10/29/2022   K 3.3 (L) 10/29/2022   CL 99 10/29/2022   CREATININE 1.40 (H) 10/29/2022   BUN 16 10/29/2022   CO2  38 (H) 10/29/2022   TSH 0.55 06/25/2022   INR 1.2 10/18/2022   HGBA1C 6.1 06/25/2022    CT ABDOMEN PELVIS W CONTRAST  Result Date: 10/17/2022 CLINICAL DATA:  Acute left upper abdominal pain EXAM: CT ABDOMEN AND PELVIS WITH CONTRAST TECHNIQUE: Multidetector CT imaging of the abdomen and pelvis was performed using the standard protocol following bolus administration of intravenous contrast. RADIATION DOSE REDUCTION: This exam was performed according to the departmental dose-optimization program which includes automated exposure control, adjustment of the mA and/or kV according to patient size and/or use of iterative reconstruction technique. CONTRAST:  75mL OMNIPAQUE IOHEXOL 350 MG/ML SOLN COMPARISON:  09/15/2022 FINDINGS: Lower chest: Mild cardiomegaly. Hepatobiliary: Unremarkable Pancreas: Atrophic pancreas with borderline dilatation of the dorsal pancreatic duct. Otherwise unremarkable. Spleen: Absent aside from small regenerative splenic lesions, compatible with splenectomy. Adrenals/Urinary Tract: Benign bilateral renal cysts. No further imaging workup of these lesions is indicated. Adrenal glands unremarkable.  Urinary bladder appears unremarkable. Stomach/Bowel: Abnormal wall thickening in the gastric antrum compatible with gastritis. Suspected large cephalad ulcer of the stomach on image 60 series 5. Underlying gastric malignancy not excluded. Normal appendix.  No dilated bowel. Vascular/Lymphatic: Atherosclerosis is present, including aortoiliac atherosclerotic disease. Reproductive: Uterus absent. Other: No supplemental non-categorized findings. Musculoskeletal: Moderate degenerative arthropathy of both hips. Lower thoracic and lumbar spondylosis and degenerative disc disease causing multilevel impingement. Previous posterior decompression at L3. IMPRESSION: 1. Abnormal wall thickening in the gastric antrum suggesting potentially severe gastritis. Suspected large cephalad ulcer of the stomach antrum.  No current free intraperitoneal gas. Underlying gastric malignancy not excluded. Follow up endoscopy is recommended. 2. Aortic atherosclerosis. 3. Mild cardiomegaly. 4. Moderate degenerative arthropathy of both hips. 5. Lower thoracic and lumbar spondylosis and degenerative disc disease causing multilevel impingement. Aortic Atherosclerosis (ICD10-I70.0). Electronically Signed   By: Gaylyn Rong M.D.   On: 10/17/2022 13:29   CT Angio Chest PE W and/or Wo Contrast  Result Date: 10/17/2022 CLINICAL DATA:  PE suspected, left chest pain EXAM: CT ANGIOGRAPHY CHEST WITH CONTRAST TECHNIQUE: Multidetector CT imaging of the chest was performed using the standard protocol during bolus administration of intravenous contrast. Multiplanar CT image reconstructions and MIPs were obtained to evaluate the vascular anatomy. RADIATION DOSE REDUCTION: This exam was performed according to the departmental dose-optimization program which includes automated exposure control, adjustment of the mA and/or kV according to patient size and/or use of iterative reconstruction technique. CONTRAST:  75mL OMNIPAQUE IOHEXOL 350 MG/ML SOLN COMPARISON:  None Available. FINDINGS: Cardiovascular: Satisfactory opacification of the pulmonary arteries to the segmental level. No evidence of pulmonary embolism. Cardiomegaly. No pericardial effusion. Aortic atherosclerosis Mediastinum/Nodes: No enlarged mediastinal, hilar, or axillary lymph nodes. Thyroid gland, trachea, and esophagus demonstrate no significant findings. Lungs/Pleura: Trace pleural effusions.  No acute airspace opacity. Upper Abdomen: Please see separately reported examination of the abdomen pelvis Musculoskeletal: No chest wall abnormality. No acute osseous findings. Review of the MIP images confirms the above findings. IMPRESSION: 1. Negative examination for pulmonary embolism. 2. Trace pleural effusions. No acute airspace opacity. 3. Cardiomegaly. Aortic Atherosclerosis  (ICD10-I70.0). Electronically Signed   By: Jearld Lesch M.D.   On: 10/17/2022 13:20   DG Chest Port 1 View  Result Date: 10/17/2022 CLINICAL DATA:  Left-sided chest pain EXAM: PORTABLE CHEST 1 VIEW COMPARISON:  05/12/2016 FINDINGS: Cardiomegaly. Unchanged elevation of the left hemidiaphragm. Both lungs are clear. The visualized skeletal structures are unremarkable. IMPRESSION: Cardiomegaly without acute abnormality of the lungs in AP portable projection. Electronically Signed   By: Jearld Lesch M.D.   On: 10/17/2022 12:18    Assessment & Plan:   Problem List Items Addressed This Visit     Essential  hypertension    BP Readings from Last 3 Encounters:  12/21/22 136/84  11/26/22 118/70  11/18/22 112/74         Low back pain    No NSAIDs due to gastric ulcer Increase Tramadol to 50-100 mg tid prn Blue-Emu cream was recommended to use 2-3 times a day       Relevant Medications   traMADol (ULTRAM) 50 MG tablet   Abnormal CBC   Relevant Orders   Comprehensive metabolic panel   Iron, TIBC and Ferritin Panel   B12 deficiency    On B12      CRI (chronic renal insufficiency), stage 3 (moderate) (HCC)    Hydrate well      Relevant Orders   Comprehensive metabolic panel   Gastritis    I re-sent Protonix 40 mg bid Rx Hope Guynell has been taking it - check at home      Gastric ulcer - Primary    I re-sent Protonix 40 mg bid Rx Hope Mittie has been taking it - check at home EGD pending      Relevant Orders   Comprehensive metabolic panel   CBC with Differential/Platelet   Iron, TIBC and Ferritin Panel      Meds ordered this encounter  Medications   traMADol (ULTRAM) 50 MG tablet    Sig: Take 1-2 tablets (50-100 mg total) by mouth every 8 (eight) hours as needed for severe pain (pain score 7-10).    Dispense:  180 tablet    Refill:  3   pantoprazole (PROTONIX) 40 MG tablet    Sig: Take 1 tablet (40 mg total) by mouth 2 (two) times daily.    Dispense:  180 tablet     Refill:  1    new script from emergency room today can you send in a 90 day supply so i can put in her 3 month pill pack Please and Thank you      Follow-up: Return in about 3 months (around 03/23/2023) for a follow-up visit.  Sonda Primes, MD

## 2022-12-21 NOTE — Addendum Note (Signed)
Addended by: Tresa Garter on: 12/21/2022 01:29 PM   Modules accepted: Level of Service

## 2022-12-21 NOTE — Assessment & Plan Note (Signed)
BP Readings from Last 3 Encounters:  12/21/22 136/84  11/26/22 118/70  11/18/22 112/74

## 2022-12-22 LAB — IRON,TIBC AND FERRITIN PANEL
%SAT: 24 % (ref 16–45)
Ferritin: 17 ng/mL (ref 16–288)
Iron: 77 ug/dL (ref 45–160)
TIBC: 320 ug/dL (ref 250–450)

## 2022-12-25 ENCOUNTER — Ambulatory Visit (AMBULATORY_SURGERY_CENTER): Payer: Medicare Other | Admitting: Internal Medicine

## 2022-12-25 ENCOUNTER — Encounter: Payer: Self-pay | Admitting: Internal Medicine

## 2022-12-25 VITALS — BP 82/48 | HR 74 | Temp 97.9°F | Resp 13 | Ht 60.0 in | Wt 127.0 lb

## 2022-12-25 DIAGNOSIS — K254 Chronic or unspecified gastric ulcer with hemorrhage: Secondary | ICD-10-CM | POA: Diagnosis not present

## 2022-12-25 DIAGNOSIS — J45909 Unspecified asthma, uncomplicated: Secondary | ICD-10-CM | POA: Diagnosis not present

## 2022-12-25 DIAGNOSIS — K222 Esophageal obstruction: Secondary | ICD-10-CM | POA: Diagnosis not present

## 2022-12-25 DIAGNOSIS — K269 Duodenal ulcer, unspecified as acute or chronic, without hemorrhage or perforation: Secondary | ICD-10-CM

## 2022-12-25 DIAGNOSIS — I10 Essential (primary) hypertension: Secondary | ICD-10-CM | POA: Diagnosis not present

## 2022-12-25 DIAGNOSIS — K2100 Gastro-esophageal reflux disease with esophagitis, without bleeding: Secondary | ICD-10-CM

## 2022-12-25 MED ORDER — SODIUM CHLORIDE 0.9 % IV SOLN: 500.0000 mL | INTRAVENOUS | Status: DC

## 2022-12-25 MED ORDER — PANTOPRAZOLE SODIUM 40 MG PO TBEC: 40.0000 mg | DELAYED_RELEASE_TABLET | Freq: Two times a day (BID) | ORAL | 11 refills | Status: DC

## 2022-12-25 NOTE — Patient Instructions (Addendum)
-   Resume previous diet. - Continue present medications. - PRESCRIBE PANTOPRAZOLE 40 mg p.o. twice daily; #60; 11 refills. Please take this daily to prevent recurrent ulcers - Return to the care of Dr. Posey Rea  YOU HAD AN ENDOSCOPIC PROCEDURE TODAY AT THE [facility]:   Refer to the procedure report that was given to you for any specific questions about what was found during the examination.  If the procedure report does not answer your questions, please call your gastroenterologist to clarify.  If you requested that your care partner not be given the details of your procedure findings, then the procedure report has been included in a sealed envelope for you to review at your convenience later.  YOU SHOULD EXPECT: Some feelings of bloating in the abdomen. Passage of more gas than usual.  Walking can help get rid of the air that was put into your GI tract during the procedure and reduce the bloating. If you had a lower endoscopy (such as a colonoscopy or flexible sigmoidoscopy) you may notice spotting of blood in your stool or on the toilet paper. If you underwent a bowel prep for your procedure, you may not have a normal bowel movement for a few days.  Please Note:  You might notice some irritation and congestion in your nose or some drainage.  This is from the oxygen used during your procedure.  There is no need for concern and it should clear up in a day or so.  SYMPTOMS TO REPORT IMMEDIATELY:  Following upper endoscopy (EGD)  Vomiting of blood or coffee ground material  New chest pain or pain under the shoulder blades  Painful or persistently difficult swallowing  New shortness of breath  Fever of 100F or higher  Black, tarry-looking stools  For urgent or emergent issues, a gastroenterologist can be reached at any hour by calling [phone]. Do not use MyChart messaging for urgent concerns.    DIET:  We do recommend a small meal at first, but then you may proceed to  your regular diet.  Drink plenty of fluids but you should avoid alcoholic beverages for 24 hours.  ACTIVITY:  You should plan to take it easy for the rest of today and you should NOT DRIVE or use heavy machinery until tomorrow (because of the sedation medicines used during the test).    FOLLOW UP: Our staff will call the number listed on your records the next business day following your procedure.  We will call around 7:15- 8:00 am to check on you and address any questions or concerns that you may have regarding the information given to you following your procedure. If we do not reach you, we will leave a message.     If any biopsies were taken you will be contacted by phone or by letter within the next 1-3 weeks.  Please call us at [phone] if you have not heard about the biopsies in 3 weeks.    SIGNATURES/CONFIDENTIALITY: You and/or your care partner have signed paperwork which will be entered into your electronic medical record.  These signatures attest to the fact that that the information above on your After Visit Summary has been reviewed and is understood.  Full responsibility of the confidentiality of this discharge information lies with you and/or your care-partner.

## 2022-12-25 NOTE — Progress Notes (Signed)
Expand All Collapse All HISTORY OF PRESENT ILLNESS:   M[facility]ory [facility] is a 77 y.o. female with multiple medical problems, including Parkinson's disease, who is seen in the hospital by GI regarding abdominal pain, abnormal CT scan of the stomach, anemia, and weight loss.  She subsequently underwent upper endoscopy October 19, 2022 by myself.  She was found to have erosive esophagitis, peptic stricture, and a large bulb/pyloric channel ulcer.  Biopsies were negative for malignancy.  She was treated with PPI therapy and monitored.  She was discharged home October 21, 2022.  Told to stay on twice daily pantoprazole.  Told to avoid NSAIDs such as Celebrex.  She follows up at this time.   She did undergo CBC October 29, 2022.  Her hemoglobin was 10.6 (9.6 at discharge in the hospital 2 weeks earlier).  She is accompanied today by her husband.  She reports that she had been taking Celebrex at home for chronic pain.  No longer.  We think that she is taking her pantoprazole twice daily, but need to confirm.  Overall she has had resolution of her abdominal pain.  Her bowels are normal colored.  Appetite slightly improved.  No new complaints.  She has questions regarding ulcer disease.       REVIEW OF SYSTEMS:   All non-GI ROS negative unless otherwise stated in the HPI except for back pain, leg pain, tremor       Past Medical History:  Diagnosis Date   Abnormal CBC 02/05/2015    Dr Mosetta Putt 12/16 new: normocytic anemia and thrombocytosis   Adjustment disorder with mixed anxiety and depressed mood 02/15/2007    Chronic  Chronic pain Grief, stress Effexor XR  Dad died in 2023/03/09   Asthma     Diverticulosis of colon (without mention of hemorrhage)     Esophageal stricture     Essential hypertension 01/26/2007    Chronic Verapamil    Falls      "I blackout"   GERD (gastroesophageal reflux disease) 02/15/2007    Chronic     Heart murmur     Hyperglycemia 03/02/2011    Mild     Insomnia disorder  12/08/2018    10/20 Carbid/Lev dose was increased - c/o hard time falling asleep (6 am) getting up at 12 am, poor sleep. Try Temazepam 15-30 mg at 11-1:30 pm    Internal hemorrhoids without mention of complication     Iron deficiency anemia 04/23/2017   Mild neurocognitive disorder, likely due to Parkinson's disease 02/09/2019   OSA (obstructive sleep apnea) 02/14/2010    In lab study (May 2018): AHI 25. Only had 45 minutes of sleep secondary to frequent awakenings secondary to sleep apnea. autocpap 5-15 cm water.    Osteoarthritis     Parkinson disease 2014-03-08    2015 2017  Primidone - d/c 2019 Parkinson's: [facility] IR. Dr Tat   Postherpetic trigeminal neuralgia 01/26/2007    Qualifier: Diagnosis of  By: Charlsie Quest RMA, [name]     Shortness of breath dyspnea      with allergies   Thrombocytosis 05/24/2017   Urinary incontinence 01/26/2007    Chronic  10/17 Detrol [place]    Vitamin D deficiency 01/26/2019               Past Surgical History:  Procedure Laterality Date   ABDOMINAL HYSTERECTOMY       BIOPSY   10/19/2022    Procedure: BIOPSY;  Surgeon: Hilarie Fredrickson, MD;  Location: [facility];  Service: Gastroenterology;;   BREAST CYST EXCISION Left 10/25/1978   COLONOSCOPY        Multiple   ESOPHAGOGASTRODUODENOSCOPY (EGD) WITH PROPOFOL N/A 10/19/2022    Procedure: ESOPHAGOGASTRODUODENOSCOPY (EGD) WITH PROPOFOL;  Surgeon: Hilarie Fredrickson, MD;  Location: [facility];  Service: Gastroenterology;  Laterality: N/A;   LUMBAR LAMINECTOMY/DECOMPRESSION MICRODISCECTOMY Bilateral 02/20/2014    Procedure: LUMBAR TWO THREE, LUMBAR THREE FOUR LUMBAR LAMINECTOMY/DECOMPRESSION MICRODISCECTOMY 2 LEVELS;  Surgeon: Tia Alert, MD;  Location: [facility];  Service: Neurosurgery;  Laterality: Bilateral;   SPLENECTOMY       TIBIA FRACTURE SURGERY Right     TIBIA FRACTURE SURGERY Left     TOTAL KNEE ARTHROPLASTY Right 02/24/2011          Social History BRITAINY KOZUB  reports that she has never  smoked. She has never used smokeless tobacco. She reports that she does not currently use alcohol. She reports that she does not use drugs.   family history includes Asthma in her mother; Dementia in her mother; Diabetes in her mother; Diabetes type II in her mother; Hypertension in her mother; Mental illness in her father; Prostate cancer in her father; Stroke in her brother; Suicidality in her son.   Allergies       Allergies  Allergen Reactions   Ace Inhibitors        Patient doesn't recall reaction.  jkl   Benicar [Olmesartan]        It made her sick   Aspirin Other (See Comments)      bruising   Citalopram Hydrobromide Diarrhea            PHYSICAL EXAMINATION: Vital signs: BP 112/74   Pulse 82   Ht 5' (1.524 m)   Wt 127 lb 9.6 oz (57.9 kg)   BMI 24.92 kg/m   Constitutional: Pleasant, chronically ill-appearing, no acute distress.  In wheelchair Psychiatric: alert and oriented x3, cooperative Eyes: extraocular movements intact, anicteric, conjunctiva pink Mouth: oral pharynx moist, no lesions Neck: supple no lymphadenopathy Cardiovascular: heart regular rate and rhythm, no murmur Lungs: clear to auscultation bilaterally Abdomen: soft, nontender, nondistended, no obvious ascites, no peritoneal signs, normal bowel sounds, no organomegaly Rectal: Omitted Extremities: no clubbing, cyanosis, or lower extremity edema bilaterally Skin: no lesions on visible extremities Neuro: No focal deficits.  Obvious parkinsonian tremor   ASSESSMENT:   1.  Recent hospitalization for abdominal pain, anemia, weight loss, and abnormal CT scan.  Found to have giant ulcer in the region of the distal antrum/pylorus/bulb.  Negative superficial biopsies.  On, we believe, pantoprazole 40 mg twice daily.  Off Celebrex 2.  Anemia.  Hemoglobin improved since hospital discharge 3.  Multiple medical problems.  Stable     PLAN:   1.  We will confirm that she is on pantoprazole 40 mg twice daily.  My  CMA will call her tomorrow.  If she is on 40 mg twice daily, continue.  If not, we will make sure that she is taking this drug as recommended. 2.  Schedule upper endoscopy to reassess GI gastric ulcer.  This in about 4 weeks (8 weeks from index endoscopy).The nature of the procedure, as well as the risks, benefits, and alternatives were carefully and thoroughly reviewed with the patient. Ample time for discussion and questions allowed. The patient understood, was satisfied, and agreed to proceed. 3.  Ongoing general medical care with Dr. Posey Rea  No interval change.  Now for upper endoscopy

## 2022-12-25 NOTE — Progress Notes (Signed)
Sedate, gd SR, tolerated procedure well, VSS, report to RN 

## 2022-12-25 NOTE — Op Note (Signed)
[facility] Patient Name: Mallory Klein Procedure Date: 12/25/2022 10:35 AM MRN: [id number] Endoscopist: [name]. Marina Goodell , MD, [id number] Age: 77 Referring MD:  Date of Birth: 11-19-1945 Gender: Female Account #: [id number] Procedure:                Upper GI endoscopy Indications:              Follow-up of acute duodenal ulcer with hemorrhage.                            Hospitalized. Endoscopy October 19, 2022 with joint                            ulcer involving the duodenal bulb and pylorus Medicines:                Monitored Anesthesia Care Procedure:                Pre-Anesthesia Assessment:                           - Prior to the procedure, a History and Physical                            was performed, and patient medications and                            allergies were reviewed. The patient's tolerance of                            previous anesthesia was also reviewed. The risks                            and benefits of the procedure and the sedation                            options and risks were discussed with the patient.                            All questions were answered, and informed consent                            was obtained. Prior Anticoagulants: The patient has                            taken no anticoagulant or antiplatelet agents. ASA                            Grade Assessment: III - A patient with severe                            systemic disease. After reviewing the risks and                            benefits, the patient was deemed in satisfactory  condition to undergo the procedure.                           After obtaining informed consent, the endoscope was                            passed under direct vision. Throughout the                            procedure, the patient's blood pressure, pulse, and                            oxygen saturations were monitored continuously. The                             Olympus Scope [id number] was introduced through the                            mouth, and advanced to the second part of duodenum.                            The upper GI endoscopy was accomplished without                            difficulty. The patient tolerated the procedure                            well. Scope In: Scope Out: Findings:                 The esophagus revealed a ringlike stricture at the                            gastroesophageal junction.                           The stomach revealed a moderate hiatal hernia.                            Otherwise unremarkable.                           The examined duodenum revealed near complete                            healing of the large duodenal ulcer. See image.                           The cardia and gastric fundus were normal on                            retroflexion, save hiatal hernia. Complications:            No immediate complications. Estimated Blood Loss:     Estimated blood loss: none. Impression:               1. Duodenal ulcer  almost completely healed.                           2. Incidental distal esophageal stricture and                            moderate hiatal hernia                           3. Otherwise unremarkable EGD. Recommendation:           - Patient has a contact number available for                            emergencies. The signs and symptoms of potential                            delayed complications were discussed with the                            patient. Return to normal activities tomorrow.                            Written discharge instructions were provided to the                            patient.                           - Resume previous diet.                           - Continue present medications.                           - PRESCRIBE PANTOPRAZOLE 40 mg p.o. twice daily;                            #60; 11 refills. Please take this daily to prevent                             recurrent ulcers                           ?" Return to the care of Dr. Birdena Crandall. Marina Goodell, MD 12/25/2022 10:54:07 AM This report has been signed electronically.

## 2022-12-28 ENCOUNTER — Telehealth: Payer: Self-pay

## 2022-12-28 NOTE — Telephone Encounter (Signed)
No answer, unable to leave a message, B.Furqan Gosselin RN. 

## 2022-12-31 ENCOUNTER — Telehealth: Payer: Self-pay | Admitting: Internal Medicine

## 2022-12-31 NOTE — Telephone Encounter (Signed)
Patient called and said she wanted to get a wheelchair because she has trouble walking. She wanted to know if Dr. Posey Rea would be able to help her do that. Patient would like a call back at [phone].

## 2023-01-01 NOTE — Telephone Encounter (Signed)
Left message with pts husband to please have the pt call the office back with details of where does the order for a wheelchair needs to be sent and what is the fax number?

## 2023-01-01 NOTE — Telephone Encounter (Signed)
DME prescription for a wheelchair attached. Where should we send it to? Thanks

## 2023-01-14 NOTE — Telephone Encounter (Signed)
Patient does not have a preference of where the wheelchair needs to come from.  Please send to medical supply company that we usually use.  Please call patient and let her know when order when placed.  [phone]

## 2023-01-15 ENCOUNTER — Other Ambulatory Visit: Payer: Self-pay | Admitting: Internal Medicine

## 2023-01-18 ENCOUNTER — Telehealth: Payer: Self-pay | Admitting: Internal Medicine

## 2023-01-18 NOTE — Telephone Encounter (Signed)
Caller & Relationship to patient: Pharmacy   Call back number: [phone]   Date of last office visit: 10.20.24  Date of next office visit: N/A  Medication(s) to be refilled:  hydrochlorothiazide (MICROZIDE) 12.5 MG capsule   Preferred Pharmacy:  [facility]  Phone: [phone]  Fax: [phone]

## 2023-01-19 MED ORDER — HYDROCHLOROTHIAZIDE 12.5 MG PO CAPS: 12.5000 mg | ORAL_CAPSULE | Freq: Every day | ORAL | 3 refills | Status: DC

## 2023-01-19 NOTE — Telephone Encounter (Signed)
Okay.  Done.  Thanks 

## 2023-01-20 ENCOUNTER — Other Ambulatory Visit: Payer: Self-pay

## 2023-02-22 ENCOUNTER — Encounter: Payer: Self-pay | Admitting: Hematology

## 2023-02-25 ENCOUNTER — Other Ambulatory Visit: Payer: Self-pay | Admitting: Internal Medicine

## 2023-02-25 ENCOUNTER — Telehealth: Payer: Self-pay

## 2023-02-25 NOTE — Telephone Encounter (Signed)
 Copied from CRM [id number]. Topic: Clinical - Medical Advice >> Feb 23, 2023  3:38 PM Melissa C wrote: Reason for CRM: patient's husband called- they purchased a chair for medical purposes and are able to get a rebate on the chair if doctor is willing to write a prescription for the chair. Patient's husband is willing to come pick up prescription if need be and if you have any questions you can contact him.

## 2023-02-28 NOTE — Telephone Encounter (Signed)
 Please print a DME Rx for the requested item.  Thank you

## 2023-03-30 ENCOUNTER — Telehealth: Payer: Self-pay | Admitting: Family Medicine

## 2023-03-30 ENCOUNTER — Telehealth: Payer: Self-pay | Admitting: Internal Medicine

## 2023-03-30 DIAGNOSIS — R269 Unspecified abnormalities of gait and mobility: Secondary | ICD-10-CM

## 2023-03-30 DIAGNOSIS — G20A1 Parkinson's disease without dyskinesia, without mention of fluctuations: Secondary | ICD-10-CM

## 2023-03-30 NOTE — Telephone Encounter (Signed)
Pt has asked to cx, she will be seen by another office.

## 2023-03-30 NOTE — Telephone Encounter (Signed)
 Copied from CRM [id number]. Topic: Referral - Request for Referral >> Mar 30, 2023  2:22 PM Merlynn A wrote: Did the patient discuss referral with their provider in the last year? Yes (If No - schedule appointment) (If Yes - send message)  Appointment offered? No  Type of order/referral and detailed reason for visit: Neurology  Preference of office, provider, location: Not Sure  If referral order, have you been seen by this specialty before? Yes (If Yes, this issue or another issue? When? Where? [facility]  Can we respond through MyChart? Yes

## 2023-04-04 NOTE — Telephone Encounter (Signed)
 Does she need a referral to neurology?  Thanks

## 2023-04-05 ENCOUNTER — Ambulatory Visit: Payer: Medicare Other | Admitting: Family Medicine

## 2023-04-05 NOTE — Telephone Encounter (Signed)
 Yes pt is needing a referral placed.

## 2023-04-06 NOTE — Telephone Encounter (Signed)
Okay.  Done.  Thanks

## 2023-04-07 ENCOUNTER — Other Ambulatory Visit (HOSPITAL_COMMUNITY): Payer: Self-pay

## 2023-04-12 ENCOUNTER — Inpatient Hospital Stay (HOSPITAL_BASED_OUTPATIENT_CLINIC_OR_DEPARTMENT_OTHER)
Admission: EM | Admit: 2023-04-12 | Discharge: 2023-04-16 | DRG: 377 | Disposition: A | Discharge status: Home Health | Payer: Medicare Other | Attending: Internal Medicine | Admitting: Internal Medicine

## 2023-04-12 ENCOUNTER — Other Ambulatory Visit: Payer: Self-pay

## 2023-04-12 ENCOUNTER — Encounter (HOSPITAL_BASED_OUTPATIENT_CLINIC_OR_DEPARTMENT_OTHER): Payer: Self-pay | Admitting: *Deleted

## 2023-04-12 ENCOUNTER — Emergency Department (HOSPITAL_BASED_OUTPATIENT_CLINIC_OR_DEPARTMENT_OTHER): Payer: Medicare Other

## 2023-04-12 DIAGNOSIS — E876 Hypokalemia: Secondary | ICD-10-CM

## 2023-04-12 DIAGNOSIS — N3281 Overactive bladder: Secondary | ICD-10-CM | POA: Diagnosis present

## 2023-04-12 DIAGNOSIS — I10 Essential (primary) hypertension: Secondary | ICD-10-CM | POA: Diagnosis present

## 2023-04-12 DIAGNOSIS — M48061 Spinal stenosis, lumbar region without neurogenic claudication: Secondary | ICD-10-CM | POA: Diagnosis not present

## 2023-04-12 DIAGNOSIS — K648 Other hemorrhoids: Secondary | ICD-10-CM | POA: Diagnosis present

## 2023-04-12 DIAGNOSIS — Z79899 Other long term (current) drug therapy: Secondary | ICD-10-CM

## 2023-04-12 DIAGNOSIS — D72829 Elevated white blood cell count, unspecified: Secondary | ICD-10-CM | POA: Diagnosis present

## 2023-04-12 DIAGNOSIS — K269 Duodenal ulcer, unspecified as acute or chronic, without hemorrhage or perforation: Secondary | ICD-10-CM | POA: Diagnosis not present

## 2023-04-12 DIAGNOSIS — Z8711 Personal history of peptic ulcer disease: Secondary | ICD-10-CM

## 2023-04-12 DIAGNOSIS — Z9889 Other specified postprocedural states: Secondary | ICD-10-CM

## 2023-04-12 DIAGNOSIS — Z8042 Family history of malignant neoplasm of prostate: Secondary | ICD-10-CM

## 2023-04-12 DIAGNOSIS — K922 Gastrointestinal hemorrhage, unspecified: Secondary | ICD-10-CM | POA: Diagnosis not present

## 2023-04-12 DIAGNOSIS — E785 Hyperlipidemia, unspecified: Secondary | ICD-10-CM | POA: Diagnosis not present

## 2023-04-12 DIAGNOSIS — G20A1 Parkinson's disease without dyskinesia, without mention of fluctuations: Secondary | ICD-10-CM | POA: Diagnosis not present

## 2023-04-12 DIAGNOSIS — Z818 Family history of other mental and behavioral disorders: Secondary | ICD-10-CM

## 2023-04-12 DIAGNOSIS — G4733 Obstructive sleep apnea (adult) (pediatric): Secondary | ICD-10-CM | POA: Diagnosis not present

## 2023-04-12 DIAGNOSIS — N179 Acute kidney failure, unspecified: Secondary | ICD-10-CM | POA: Diagnosis present

## 2023-04-12 DIAGNOSIS — Z8249 Family history of ischemic heart disease and other diseases of the circulatory system: Secondary | ICD-10-CM | POA: Diagnosis not present

## 2023-04-12 DIAGNOSIS — J45909 Unspecified asthma, uncomplicated: Secondary | ICD-10-CM | POA: Diagnosis present

## 2023-04-12 DIAGNOSIS — K2971 Gastritis, unspecified, with bleeding: Secondary | ICD-10-CM | POA: Diagnosis present

## 2023-04-12 DIAGNOSIS — D649 Anemia, unspecified: Secondary | ICD-10-CM | POA: Diagnosis present

## 2023-04-12 DIAGNOSIS — Z90710 Acquired absence of both cervix and uterus: Secondary | ICD-10-CM | POA: Diagnosis not present

## 2023-04-12 DIAGNOSIS — K5731 Diverticulosis of large intestine without perforation or abscess with bleeding: Secondary | ICD-10-CM | POA: Diagnosis not present

## 2023-04-12 DIAGNOSIS — K279 Peptic ulcer, site unspecified, unspecified as acute or chronic, without hemorrhage or perforation: Secondary | ICD-10-CM | POA: Diagnosis present

## 2023-04-12 DIAGNOSIS — Z833 Family history of diabetes mellitus: Secondary | ICD-10-CM | POA: Diagnosis not present

## 2023-04-12 DIAGNOSIS — Z825 Family history of asthma and other chronic lower respiratory diseases: Secondary | ICD-10-CM

## 2023-04-12 DIAGNOSIS — K222 Esophageal obstruction: Secondary | ICD-10-CM | POA: Diagnosis not present

## 2023-04-12 DIAGNOSIS — R413 Other amnesia: Secondary | ICD-10-CM | POA: Diagnosis present

## 2023-04-12 DIAGNOSIS — Z96651 Presence of right artificial knee joint: Secondary | ICD-10-CM | POA: Diagnosis not present

## 2023-04-12 DIAGNOSIS — E669 Obesity, unspecified: Secondary | ICD-10-CM | POA: Diagnosis present

## 2023-04-12 DIAGNOSIS — K2970 Gastritis, unspecified, without bleeding: Secondary | ICD-10-CM | POA: Diagnosis present

## 2023-04-12 DIAGNOSIS — F419 Anxiety disorder, unspecified: Secondary | ICD-10-CM | POA: Diagnosis present

## 2023-04-12 DIAGNOSIS — K921 Melena: Secondary | ICD-10-CM | POA: Diagnosis not present

## 2023-04-12 DIAGNOSIS — K449 Diaphragmatic hernia without obstruction or gangrene: Secondary | ICD-10-CM | POA: Diagnosis not present

## 2023-04-12 DIAGNOSIS — K31819 Angiodysplasia of stomach and duodenum without bleeding: Secondary | ICD-10-CM | POA: Diagnosis not present

## 2023-04-12 DIAGNOSIS — J4540 Moderate persistent asthma, uncomplicated: Secondary | ICD-10-CM

## 2023-04-12 DIAGNOSIS — Z6824 Body mass index (BMI) 24.0-24.9, adult: Secondary | ICD-10-CM

## 2023-04-12 DIAGNOSIS — Z886 Allergy status to analgesic agent status: Secondary | ICD-10-CM

## 2023-04-12 DIAGNOSIS — K31811 Angiodysplasia of stomach and duodenum with bleeding: Secondary | ICD-10-CM | POA: Diagnosis present

## 2023-04-12 DIAGNOSIS — G8929 Other chronic pain: Secondary | ICD-10-CM

## 2023-04-12 DIAGNOSIS — K209 Esophagitis, unspecified without bleeding: Secondary | ICD-10-CM | POA: Diagnosis not present

## 2023-04-12 DIAGNOSIS — K264 Chronic or unspecified duodenal ulcer with hemorrhage: Secondary | ICD-10-CM | POA: Diagnosis present

## 2023-04-12 DIAGNOSIS — M545 Low back pain, unspecified: Secondary | ICD-10-CM | POA: Diagnosis present

## 2023-04-12 DIAGNOSIS — D62 Acute posthemorrhagic anemia: Secondary | ICD-10-CM | POA: Diagnosis present

## 2023-04-12 DIAGNOSIS — Z823 Family history of stroke: Secondary | ICD-10-CM

## 2023-04-12 DIAGNOSIS — M5440 Lumbago with sciatica, unspecified side: Secondary | ICD-10-CM

## 2023-04-12 DIAGNOSIS — K2091 Esophagitis, unspecified with bleeding: Secondary | ICD-10-CM | POA: Diagnosis not present

## 2023-04-12 DIAGNOSIS — K5730 Diverticulosis of large intestine without perforation or abscess without bleeding: Secondary | ICD-10-CM | POA: Diagnosis not present

## 2023-04-12 DIAGNOSIS — K219 Gastro-esophageal reflux disease without esophagitis: Secondary | ICD-10-CM | POA: Diagnosis present

## 2023-04-12 DIAGNOSIS — Z9081 Acquired absence of spleen: Secondary | ICD-10-CM

## 2023-04-12 DIAGNOSIS — Z888 Allergy status to other drugs, medicaments and biological substances status: Secondary | ICD-10-CM

## 2023-04-12 DIAGNOSIS — Z860101 Personal history of adenomatous and serrated colon polyps: Secondary | ICD-10-CM

## 2023-04-12 DIAGNOSIS — K920 Hematemesis: Secondary | ICD-10-CM | POA: Diagnosis not present

## 2023-04-12 LAB — CBC
HCT: 18.7 % — ABNORMAL LOW (ref 36.0–46.0)
HCT: 16.4 % — ABNORMAL LOW (ref 36.0–46.0)
Hemoglobin: 6.2 g/dL — CL (ref 12.0–15.0)
Hemoglobin: 5.4 g/dL — CL (ref 12.0–15.0)
MCH: 29.7 pg (ref 26.0–34.0)
MCH: 29.7 pg (ref 26.0–34.0)
MCHC: 33.2 g/dL (ref 30.0–36.0)
MCHC: 32.9 g/dL (ref 30.0–36.0)
MCV: 89.5 fL (ref 80.0–100.0)
MCV: 90.1 fL (ref 80.0–100.0)
Platelets: 243 10*3/uL (ref 150–400)
Platelets: 218 10*3/uL (ref 150–400)
RBC: 2.09 MIL/uL — ABNORMAL LOW (ref 3.87–5.11)
RBC: 1.82 MIL/uL — ABNORMAL LOW (ref 3.87–5.11)
RDW: 16.9 % — ABNORMAL HIGH (ref 11.5–15.5)
RDW: 17.2 % — ABNORMAL HIGH (ref 11.5–15.5)
WBC: 12.9 10*3/uL — ABNORMAL HIGH (ref 4.0–10.5)
WBC: 13.2 10*3/uL — ABNORMAL HIGH (ref 4.0–10.5)
nRBC: 0 % (ref 0.0–0.2)
nRBC: 0 % (ref 0.0–0.2)

## 2023-04-12 LAB — HEPATIC FUNCTION PANEL
ALT: 11 U/L (ref 0–44)
AST: 18 U/L (ref 15–41)
Albumin: 3.3 g/dL — ABNORMAL LOW (ref 3.5–5.0)
Alkaline Phosphatase: 37 U/L — ABNORMAL LOW (ref 38–126)
Bilirubin, Direct: 0.2 mg/dL (ref 0.0–0.2)
Indirect Bilirubin: 0.3 mg/dL (ref 0.3–0.9)
Total Bilirubin: 0.5 mg/dL (ref 0.0–1.2)
Total Protein: 5.9 g/dL — ABNORMAL LOW (ref 6.5–8.1)

## 2023-04-12 LAB — BASIC METABOLIC PANEL
Anion gap: 5 (ref 5–15)
BUN: 45 mg/dL — ABNORMAL HIGH (ref 8–23)
CO2: 29 mmol/L (ref 22–32)
Calcium: 8.4 mg/dL — ABNORMAL LOW (ref 8.9–10.3)
Chloride: 106 mmol/L (ref 98–111)
Creatinine, Ser: 1.27 mg/dL — ABNORMAL HIGH (ref 0.44–1.00)
GFR, Estimated: 44 mL/min — ABNORMAL LOW (ref >60)
Glucose, Bld: 119 mg/dL — ABNORMAL HIGH (ref 70–99)
Potassium: 4.1 mmol/L (ref 3.5–5.1)
Sodium: 140 mmol/L (ref 135–145)

## 2023-04-12 LAB — PROTIME-INR
INR: 1.2 (ref 0.8–1.2)
Prothrombin Time: 15.4 s — ABNORMAL HIGH (ref 11.4–15.2)

## 2023-04-12 LAB — OCCULT BLOOD X 1 CARD TO LAB, STOOL: Fecal Occult Bld: POSITIVE — AB

## 2023-04-12 LAB — PREPARE RBC (CROSSMATCH)

## 2023-04-12 MED ORDER — SODIUM CHLORIDE 0.9% IV SOLUTION: Freq: Once | INTRAVENOUS | Status: AC

## 2023-04-12 MED ORDER — SODIUM CHLORIDE 0.9% FLUSH
3.0000 mL | Freq: Two times a day (BID) | INTRAVENOUS | Status: DC
  Administered 2023-04-12 – 2023-04-16 (×7): 3 mL via INTRAVENOUS

## 2023-04-12 MED ORDER — PANTOPRAZOLE SODIUM 40 MG IV SOLR
40.0000 mg | INTRAVENOUS | Status: AC
  Filled 2023-04-12: qty 10

## 2023-04-12 MED ORDER — PANTOPRAZOLE SODIUM 40 MG IV SOLR: 40.0000 mg | Freq: Two times a day (BID) | INTRAVENOUS | Status: DC

## 2023-04-12 MED ORDER — PANTOPRAZOLE 80MG IVPB - SIMPLE MED
80.0000 mg | Freq: Once | INTRAVENOUS | Status: DC
  Filled 2023-04-12: qty 100

## 2023-04-12 MED ORDER — SODIUM CHLORIDE 0.9 % IV SOLN: INTRAVENOUS | Status: AC

## 2023-04-12 MED ORDER — SODIUM CHLORIDE 0.9 % IV BOLUS
1000.0000 mL | Freq: Once | INTRAVENOUS | Status: AC
  Administered 2023-04-12: 1000 mL via INTRAVENOUS

## 2023-04-12 MED ORDER — PANTOPRAZOLE SODIUM 40 MG IV SOLR: 40.0000 mg | INTRAVENOUS | Status: DC

## 2023-04-12 MED ORDER — PANTOPRAZOLE SODIUM 40 MG IV SOLR
INTRAVENOUS | Status: AC
  Administered 2023-04-12: 40 mg via INTRAVENOUS
  Filled 2023-04-12: qty 20

## 2023-04-12 MED ORDER — ACETAMINOPHEN 650 MG RE SUPP: 650.0000 mg | Freq: Four times a day (QID) | RECTAL | Status: DC | PRN

## 2023-04-12 MED ORDER — ACETAMINOPHEN 325 MG PO TABS
650.0000 mg | ORAL_TABLET | Freq: Four times a day (QID) | ORAL | Status: DC | PRN
  Administered 2023-04-14: 650 mg via ORAL
  Filled 2023-04-12: qty 2

## 2023-04-12 MED ORDER — BOOST / RESOURCE BREEZE PO LIQD CUSTOM
1.0000 | Freq: Three times a day (TID) | ORAL | Status: DC
  Administered 2023-04-12 – 2023-04-16 (×8): 1 via ORAL

## 2023-04-12 MED ORDER — FESOTERODINE FUMARATE ER 4 MG PO TB24
4.0000 mg | ORAL_TABLET | Freq: Every day | ORAL | Status: DC
  Administered 2023-04-13 – 2023-04-16 (×3): 4 mg via ORAL
  Filled 2023-04-12 (×4): qty 1

## 2023-04-12 MED ORDER — HYDROXYZINE HCL 25 MG PO TABS: 50.0000 mg | ORAL_TABLET | Freq: Four times a day (QID) | ORAL | Status: DC | PRN

## 2023-04-12 MED ORDER — PRAMIPEXOLE DIHYDROCHLORIDE 0.25 MG PO TABS
0.7500 mg | ORAL_TABLET | Freq: Two times a day (BID) | ORAL | Status: DC
  Administered 2023-04-12 – 2023-04-16 (×7): 0.75 mg via ORAL
  Filled 2023-04-12 (×7): qty 3

## 2023-04-12 MED ORDER — PANTOPRAZOLE SODIUM 40 MG IV SOLR
40.0000 mg | INTRAVENOUS | Status: AC
  Administered 2023-04-12: 40 mg via INTRAVENOUS
  Filled 2023-04-12: qty 10

## 2023-04-12 MED ORDER — PANTOPRAZOLE INFUSION (NEW) - SIMPLE MED
8.0000 mg/h | INTRAVENOUS | Status: DC
  Administered 2023-04-12: 8 mg/h via INTRAVENOUS
  Filled 2023-04-12: qty 100

## 2023-04-12 MED ORDER — ROSUVASTATIN CALCIUM 5 MG PO TABS
10.0000 mg | ORAL_TABLET | Freq: Every day | ORAL | Status: DC
  Administered 2023-04-13 – 2023-04-16 (×3): 10 mg via ORAL
  Filled 2023-04-12 (×3): qty 2

## 2023-04-12 MED ORDER — PANTOPRAZOLE INFUSION (NEW) - SIMPLE MED
8.0000 mg/h | INTRAVENOUS | Status: DC
Start: 2023-04-12 — End: 2023-04-13
  Filled 2023-04-12 (×2): qty 100

## 2023-04-12 MED ORDER — QUETIAPINE FUMARATE 100 MG PO TABS
100.0000 mg | ORAL_TABLET | Freq: Every day | ORAL | Status: DC
  Administered 2023-04-12 – 2023-04-15 (×4): 100 mg via ORAL
  Filled 2023-04-12 (×4): qty 1

## 2023-04-12 NOTE — Plan of Care (Addendum)
    M[facility], is a 78 y.o. female, DOB - 1945/06/20, JYN:[id number]  H/O PUD, Parkinsons, had UGIB 6 mths ago, now presented to [facility] with Vaginal bleed, on exam +++ melena - Hb 6, [facility] aware, NPO, IV PPI drip, Type screen and transfuse 2 units at [facility] - [facility] cannot transfuse, call [name] upon arrival - Dr Leonides Schanz already knows to expect a call.   Vitals:   04/12/23 1229 04/12/23 1359  BP: (!) 157/78 113/71  Pulse: (!) 109 96  Resp: 20 20  Temp: 98.3 F (36.8 C)   SpO2: 100% 98%        Data Review   Micro Results No results found for this or any previous visit (from the past 240 hours).  Radiology Reports No results found.  CBC Recent Labs  Lab 04/12/23 1243  WBC 12.9*  HGB 6.2*  HCT 18.7*  PLT 243  MCV 89.5  MCH 29.7  MCHC 33.2  RDW 16.9*    Chemistries  Recent Labs  Lab 04/12/23 1408  NA 140  K 4.1  CL 106  CO2 29  GLUCOSE 119*  BUN 45*  CREATININE 1.27*  CALCIUM 8.4*   ------------------------------------------------------------------------------------------------------------------ CrCl cannot be calculated (Unknown ideal weight.). ------------------------------------------------------------------------------------------------------------------ No results for input(s): "HGBA1C" in the last 72 hours. ------------------------------------------------------------------------------------------------------------------ No results for input(s): "CHOL", "HDL", "LDLCALC", "TRIG", "CHOLHDL", "LDLDIRECT" in the last 72 hours. ------------------------------------------------------------------------------------------------------------------ No results for input(s): "TSH", "T4TOTAL", "T3FREE", "THYROIDAB" in the last 72 hours.  Invalid input(s): "FREET3" ------------------------------------------------------------------------------------------------------------------ No  results for input(s): "VITAMINB12", "FOLATE", "FERRITIN", "TIBC", "IRON", "RETICCTPCT" in the last 72 hours.  Coagulation profile No results for input(s): "INR", "PROTIME" in the last 168 hours.  No results for input(s): "DDIMER" in the last 72 hours.  Cardiac Enzymes No results for input(s): "CKMB", "TROPONINI", "MYOGLOBIN" in the last 168 hours.  Invalid input(s): "CK" ------------------------------------------------------------------------------------------------------------------ Invalid input(s): "POCBNP"   Signature  Susa Raring M.D on 04/12/2023 at 3:23 PM   -  To page go to [email or web address]

## 2023-04-12 NOTE — ED Triage Notes (Signed)
 Pt is here for vaginal bleeding which began Friday or Saturday. No pain with urination.

## 2023-04-12 NOTE — ED Provider Notes (Addendum)
 [facility] AT [facility] Provider Note   CSN: [id number] Arrival date & time: 04/12/23  1149     History  Chief Complaint  Patient presents with  . Vaginal Bleeding    Mallory Klein is a 78 y.o. female.   Vaginal Bleeding    78 year old female with medical history significant for Parkinson's disease, epic ulcer disease and GI bleed requiring hospitalization in August 2024 who presents to the Emergency Department with a complaint of melena.  The patient also thinks she may be experiencing vaginal bleeding.  She noticed dark stool per rectum over the last several days.  She endorses generalized abdominal discomfort.  She denies any NSAID use currently.  She denies any lightheadedness, fatigue, shortness of breath.  She was seen in the hospital by [facility] gastroenterology.  She had been seen on EGD to have erosive esophagitis, peptic stricture and a large bulb/pyloric channel ulcer.  Biopsies were negative for malignancy at the time.  She was treated with PPI therapy and monitored.  She was told to avoid NSAIDs.  She followed up in clinic and her hemoglobin had been stable.  Home Medications Prior to Admission medications   Medication Sig Start Date End Date Taking? Authorizing Provider  cetirizine (ZYRTEC) 10 MG tablet TAKE ONE TABLET BY MOUTH EVERY DAY 09/21/22   Plotnikov, Georgina Quint, MD  Cholecalciferol (VITAMIN D3) 50 MCG (2000 UT) capsule Take 1 capsule (2,000 Units total) by mouth daily. 12/21/22   Plotnikov, Georgina Quint, MD  [employer] SENNA PLUS 8.6-50 MG tablet Take 1 tablet by mouth 2 (two) times daily. 09/26/20   [provider]  cyanocobalamin (VITAMIN B12) 1000 MCG/ML injection INJECT 1 ML INTO THE SKIN EVERY 14 DAYS 10/20/21   Corwin Levins, MD  diclofenac Sodium (VOLTAREN) 1 % GEL Apply 4 g topically daily as needed (pain). 04/25/13   [provider]  docusate sodium (COLACE) 100 MG capsule Take 100 mg by mouth at bedtime. 06/26/19    [provider]  hydrochlorothiazide (MICROZIDE) 12.5 MG capsule Take 1 capsule (12.5 mg total) by mouth daily. 01/19/23   Plotnikov, Georgina Quint, MD  hydrOXYzine (VISTARIL) 50 MG capsule Take 1 capsule (50 mg total) by mouth every 6 (six) hours as needed for anxiety. 10/21/22   Narda Bonds, MD  methocarbamol (ROBAXIN) 500 MG tablet Take 1 tablet (500 mg total) by mouth every 8 (eight) hours as needed for muscle spasms. 11/26/22   Plotnikov, Georgina Quint, MD  Multiple Vitamin (MULTIVITAMIN WITH MINERALS) TABS tablet Take 1 tablet by mouth daily. 10/22/22   Narda Bonds, MD  pantoprazole (PROTONIX) 40 MG tablet Take 1 tablet (40 mg total) by mouth 2 (two) times daily. 12/21/22   Plotnikov, Georgina Quint, MD  pantoprazole (PROTONIX) 40 MG tablet Take 1 tablet (40 mg total) by mouth 2 (two) times daily. 12/25/22   Hilarie Fredrickson, MD  polyethylene glycol (MIRALAX / GLYCOLAX) 17 g packet Take 17 g by mouth daily as needed for mild constipation.    [provider]  potassium chloride SA (KLOR-CON M) 20 MEQ tablet Take 1 tablet (20 mEq total) by mouth daily for 1 day. 10/29/22 11/16/22  Avanell Shackleton, NP-C  Pramipexole Dihydrochloride 0.75 MG TB24 Take 1 tablet (0.75 mg total) by mouth in the morning and at bedtime. 06/25/22   [name], [name], NP  QUEtiapine (SEROQUEL) 100 MG tablet TAKE ONE TABLET BY MOUTH AT BEDTIME 01/18/23   Plotnikov, Georgina Quint, MD  rosuvastatin (  CRESTOR) 10 MG tablet TAKE ONE TABLET BY MOUTH EVERY DAY 12/04/22   Plotnikov, Georgina Quint, MD  solifenacin (VESICARE) 5 MG tablet TAKE ONE TABLET BY MOUTH EVERY DAY 01/18/23   Plotnikov, Georgina Quint, MD  sucralfate (CARAFATE) 1 g tablet Take 1 tablet (1 g total) by mouth 4 (four) times daily -  with meals and at bedtime. 10/21/22 11/20/22  Narda Bonds, MD  traMADol (ULTRAM) 50 MG tablet Take 1-2 tablets (50-100 mg total) by mouth every 8 (eight) hours as needed for severe pain (pain score 7-10). 12/21/22   Plotnikov, Georgina Quint, MD  donepezil  (ARICEPT) 5 MG tablet Take 1 tablet (5 mg total) by mouth at bedtime. 11/27/19 07/23/20  Plotnikov, Georgina Quint, MD  venlafaxine XR (EFFEXOR-XR) 150 MG 24 hr capsule TAKE 1 CAPSULE BY MOUTH  EVERY DAY WITH BREAKFAST 05/29/19 07/23/20  Plotnikov, Georgina Quint, MD      Allergies    Ace inhibitors, Benicar [olmesartan], Aspirin, and Citalopram hydrobromide    Review of Systems   Review of Systems  Gastrointestinal:  Positive for blood in stool.  All other systems reviewed and are negative.   Physical Exam Updated Vital Signs BP 113/71   Pulse 96   Temp 98.3 F (36.8 C)   Resp 20   SpO2 98%  Physical Exam Vitals and nursing note reviewed. Exam conducted with a chaperone present.  Constitutional:      General: She is not in acute distress.    Appearance: She is well-developed.  HENT:     Head: Normocephalic and atraumatic.  Eyes:     Conjunctiva/sclera: Conjunctivae normal.  Cardiovascular:     Rate and Rhythm: Normal rate and regular rhythm.  Pulmonary:     Effort: Pulmonary effort is normal. No respiratory distress.     Breath sounds: Normal breath sounds.  Abdominal:     Palpations: Abdomen is soft.     Tenderness: There is no abdominal tenderness.  Genitourinary:    Comments: Melena present on the exam glove, no vaginal bleeding noted Musculoskeletal:        General: No swelling.     Cervical back: Neck supple.  Skin:    General: Skin is warm and dry.     Capillary Refill: Capillary refill takes less than 2 seconds.  Neurological:     Mental Status: She is alert.  Psychiatric:        Mood and Affect: Mood normal.     ED Results / Procedures / Treatments   Labs (all labs ordered are listed, but only abnormal results are displayed) Labs Reviewed  CBC - Abnormal; Notable for the following components:      Result Value   WBC 12.9 (*)    RBC 2.09 (*)    Hemoglobin 6.2 (*)    HCT 18.7 (*)    RDW 16.9 (*)    All other components within normal limits  BASIC METABOLIC  PANEL - Abnormal; Notable for the following components:   Glucose, Bld 119 (*)    BUN 45 (*)    Creatinine, Ser 1.27 (*)    Calcium 8.4 (*)    GFR, Estimated 44 (*)    All other components within normal limits  URINALYSIS, ROUTINE W REFLEX MICROSCOPIC  OCCULT BLOOD X 1 CARD TO LAB, STOOL  PROTIME-INR  HEPATIC FUNCTION PANEL  TYPE AND SCREEN    EKG None  Radiology No results found.  Procedures .Critical Care  Performed by: Ernie Avena, MD Authorized by:  Ernie Avena, MD   Critical care provider statement:    Critical care time (minutes):  30   Critical care was time spent personally by me on the following activities:  Development of treatment plan with patient or surrogate, discussions with consultants, evaluation of patient's response to treatment, examination of patient, ordering and review of laboratory studies, ordering and review of radiographic studies, ordering and performing treatments and interventions, pulse oximetry, re-evaluation of patient's condition and review of old charts   Care discussed with: admitting provider       Medications Ordered in ED Medications  sodium chloride 0.9 % bolus 1,000 mL (has no administration in time range)    And  0.9 %  sodium chloride infusion (has no administration in time range)  pantoprazole (PROTONIX) injection 40 mg (has no administration in time range)  pantoprazole (PROTONIX) 80 mg /NS 100 mL IVPB (has no administration in time range)  pantoprozole (PROTONIX) 80 mg /NS 100 mL infusion (has no administration in time range)  pantoprazole (PROTONIX) injection 40 mg (has no administration in time range)    ED Course/ Medical Decision Making/ A&P Clinical Course as of 04/12/23 1612  Mon Apr 12, 2023  1255 Hemoglobin(!!): 6.2 [JL]  1612 Fecal Occult Blood, POC(!): POSITIVE [JL]    Clinical Course User Index [JL] Ernie Avena, MD                                 Medical Decision Making Amount and/or Complexity of  Data Reviewed Labs: ordered. Decision-making details documented in ED Course.  Risk Prescription drug management. Decision regarding hospitalization.    78 year old female with medical history significant for Parkinson's disease, epic ulcer disease and GI bleed requiring hospitalization in August 2024 who presents to the Emergency Department with a complaint of melena.  The patient also thinks she may be experiencing vaginal bleeding.  She noticed dark stool per rectum over the last several days.  She endorses generalized abdominal discomfort.  She denies any NSAID use currently.  She denies any lightheadedness, fatigue, shortness of breath.  She was seen in the hospital by [facility] gastroenterology.  She had been seen on EGD to have erosive esophagitis, peptic stricture and a large bulb/pyloric channel ulcer.  Biopsies were negative for malignancy at the time.  She was treated with PPI therapy and monitored.  She was told to avoid NSAIDs.  She followed up in clinic and her hemoglobin had been stable.  On arrival, the patient was afebrile, mildly tachycardic heart rate 109, RR 20, BP 157/78, subsequent improved to 113/71 without intervention, saturating high percent on room air.  Sinus tachycardia noted on cardiac telemetry.  Physical exam revealed gross melena on the exam glove.  No evidence of vaginal bleeding on exam.  Suspect likely upper GI bleed similar to the patient's previous presentation back in August when she had a peptic ulcer that was bleeding.  IV access was obtained, the patient was administered an IV fluid bolus.  Administered IV Protonix.  CBC revealed a mild leukocytosis 12.9, anemia to 6.2 which is a drop of 5 points from 3 months ago.  BMP revealed elevated but stable serum creatinine at 1.27, BUN elevated to 45.  Patient without significant abdominal tenderness on exam, hemodynamically stable.  Informed her of the diagnosis of likely upper GI bleed requiring inpatient admission and  further workup with gastroenterology.  [facility] gastroenterology was consulted, spoke with Mike Gip,  PA who will see the patient in consultation under Dr. Leonides Schanz.  Hospitalist medicine consulted for admission, Dr. Thedore Mins accepting.  Given the lack of blood bank and blood product availability at med center [facility], the ER over at [facility] was contacted for ER to ER transfer for blood transfusion, Dr. Silverio Lay accepting. However, an inpatient bed rapidly became available and pt was instead admitted for blood transfusion and further workup.  Final Clinical Impression(s) / ED Diagnoses Final diagnoses:  Melena  Anemia, unspecified type  Gastrointestinal hemorrhage, unspecified gastrointestinal hemorrhage type    Rx / DC Orders ED Discharge Orders     None         Ernie Avena, MD 04/12/23 1535    Ernie Avena, MD 04/12/23 1540

## 2023-04-12 NOTE — H&P (Signed)
 History and Physical   LAVAUGHN BISIG WJX:[id number] DOB: November 25, 1945 DOA: 04/12/2023  PCP: Tresa Garter, MD   Patient coming from: Home  Chief Complaint: Melena,?  Vaginal bleeding  HPI: Mallory Klein is a 78 y.o. female with medical history significant of hypertension, GERD, gastritis, PUD, anemia, asthma, anxiety, memory loss, Parkinson's disease, OSA, OAB, obesity, low back pain, spinal stenosis, status post laminectomy presenting with melena and possible vaginal bleeding.  Patient reports she has had 2 days of melanotic stools.  She also suspicious for possible vaginal bleeding.  Denies any recent NSAID use.  Does report abdominal pain.  Has had admission for GI bleed in August of last year secondary to PUD and esophagitis.  Did not require transfusion at that time.  Denies fevers, chills, chest pain, shortness of breath, constipation, diarrhea, nausea, vomiting.  ED Course: Vital signs in the ED notable for blood pressure in the 100s to 150s systolic, heart rate in the 90s to 100s, respiratory rate in the 20s.  Lab workup included CMP with BUN 45, creatinine stable 1.27, glucose 119, calcium 8.4, protein 5.9, albumin 3.3.  CBC with leukocytosis 12.9, hemoglobin 6.2 which is down from 11.43 months ago.  PT 15.4, INR normal.  FOBT positive.  No imaging in the ED.  Patient received IV PPI and then ultimately started on IV PPI infusion in the ED.  Also received 1 L IV fluids and started on a rate of fluids.  GI consulted in the ED and notified on arrival and states they plan to see the patient in the morning of 2/18 unless patient becomes unstable or more urgent evaluation is requested.  Review of Systems: As per HPI otherwise all other systems reviewed and are negative.  Past Medical History:  Diagnosis Date   Abnormal CBC 02/05/2015   Dr Mosetta Putt 12/16 new: normocytic anemia and thrombocytosis   Adjustment disorder with mixed anxiety and depressed mood 02/15/2007   Chronic   Chronic pain Grief, stress Effexor XR  Dad died in Mar 05, 2024   Asthma    Diverticulosis of colon (without mention of hemorrhage)    Esophageal stricture    Essential hypertension 01/26/2007   Chronic Verapamil    Falls    "I blackout"   GERD (gastroesophageal reflux disease) 02/15/2007   Chronic     Heart murmur    Hyperglycemia 03/02/2011   Mild     Insomnia disorder 12/08/2018   10/20 Carbid/Lev dose was increased - c/o hard time falling asleep (6 am) getting up at 12 am, poor sleep. Try Temazepam 15-30 mg at 11-1:30 pm    Internal hemorrhoids without mention of complication    Iron deficiency anemia 04/23/2017   Mild neurocognitive disorder, likely due to Parkinson's disease 02/09/2019   OSA (obstructive sleep apnea) 02/14/2010   In lab study (May 2018): AHI 25. Only had 45 minutes of sleep secondary to frequent awakenings secondary to sleep apnea. autocpap 5-15 cm water.    Osteoarthritis    Parkinson disease (HCC) 03/05/14   2015 2017  Primidone - d/c 2019 Parkinson's: [facility] IR. Dr Tat   Postherpetic trigeminal neuralgia 01/26/2007   Qualifier: Diagnosis of  By: Charlsie Quest RMA, [name]     Shortness of breath dyspnea    with allergies   Shoulder contusion 07/31/2020   severe R shoulder pain after a fall on 07/20/20, neck pain. There was a LOC. Pt went to ER 4 timeas. All CTs,X rays were (-)     Sleep apnea  Syncope and collapse 08/06/2020   Remote syncope on 07/20/2020.  Possible recurrent.  Cardiology consultation     Thrombocytosis 05/24/2017   Urinary incontinence 01/26/2007   Chronic  10/17 Detrol LA    Vitamin D deficiency 01/26/2019    Past Surgical History:  Procedure Laterality Date   ABDOMINAL HYSTERECTOMY     BIOPSY  10/19/2022   Procedure: BIOPSY;  Surgeon: Hilarie Fredrickson, MD;  Location: [facility];  Service: Gastroenterology;;   BREAST CYST EXCISION Left 10/25/1978   COLONOSCOPY     Multiple   ESOPHAGOGASTRODUODENOSCOPY (EGD) WITH PROPOFOL N/A 10/19/2022    Procedure: ESOPHAGOGASTRODUODENOSCOPY (EGD) WITH PROPOFOL;  Surgeon: Hilarie Fredrickson, MD;  Location: [facility];  Service: Gastroenterology;  Laterality: N/A;   LUMBAR LAMINECTOMY/DECOMPRESSION MICRODISCECTOMY Bilateral 02/20/2014   Procedure: LUMBAR TWO THREE, LUMBAR THREE FOUR LUMBAR LAMINECTOMY/DECOMPRESSION MICRODISCECTOMY 2 LEVELS;  Surgeon: Tia Alert, MD;  Location: [facility];  Service: Neurosurgery;  Laterality: Bilateral;   SPLENECTOMY     TIBIA FRACTURE SURGERY Right    TIBIA FRACTURE SURGERY Left    TOTAL KNEE ARTHROPLASTY Right 02/24/2011    Social History  reports that she has never smoked. She has never used smokeless tobacco. She reports current alcohol use. She reports that she does not use drugs.  Allergies  Allergen Reactions   Ace Inhibitors     Patient doesn't recall reaction.  jkl   Benicar [Olmesartan]     It made her sick   Aspirin Other (See Comments)    bruising   Citalopram Hydrobromide Diarrhea    Family History  Problem Relation Age of Onset   Hypertension Mother    Diabetes type II Mother    Asthma Mother    Diabetes Mother    Dementia Mother    Prostate cancer Father    Mental illness Father        dementia   Stroke Brother        died in prison   Suicidality Son    Colon cancer Neg Hx    Rectal cancer Neg Hx    Parkinson's disease Neg Hx    Colon polyps Neg Hx    Esophageal cancer Neg Hx    Stomach cancer Neg Hx    Ulcerative colitis Neg Hx   Reviewed on admission  Prior to Admission medications   Medication Sig Start Date End Date Taking? Authorizing Provider  cetirizine (ZYRTEC) 10 MG tablet TAKE ONE TABLET BY MOUTH EVERY DAY 09/21/22   Plotnikov, Georgina Quint, MD  Cholecalciferol (VITAMIN D3) 50 MCG (2000 UT) capsule Take 1 capsule (2,000 Units total) by mouth daily. 12/21/22   Plotnikov, Georgina Quint, MD  [employer] SENNA PLUS 8.6-50 MG tablet Take 1 tablet by mouth 2 (two) times daily. 09/26/20   [provider]  cyanocobalamin  (VITAMIN B12) 1000 MCG/ML injection INJECT 1 ML INTO THE SKIN EVERY 14 DAYS 10/20/21   Corwin Levins, MD  diclofenac Sodium (VOLTAREN) 1 % GEL Apply 4 g topically daily as needed (pain). 04/25/13   [provider]  docusate sodium (COLACE) 100 MG capsule Take 100 mg by mouth at bedtime. 06/26/19   [provider]  hydrochlorothiazide (MICROZIDE) 12.5 MG capsule Take 1 capsule (12.5 mg total) by mouth daily. 01/19/23   Plotnikov, Georgina Quint, MD  hydrOXYzine (VISTARIL) 50 MG capsule Take 1 capsule (50 mg total) by mouth every 6 (six) hours as needed for anxiety. 10/21/22   Narda Bonds, MD  methocarbamol (ROBAXIN) 500 MG  tablet Take 1 tablet (500 mg total) by mouth every 8 (eight) hours as needed for muscle spasms. 11/26/22   Plotnikov, Georgina Quint, MD  Multiple Vitamin (MULTIVITAMIN WITH MINERALS) TABS tablet Take 1 tablet by mouth daily. 10/22/22   Narda Bonds, MD  pantoprazole (PROTONIX) 40 MG tablet Take 1 tablet (40 mg total) by mouth 2 (two) times daily. 12/21/22   Plotnikov, Georgina Quint, MD  pantoprazole (PROTONIX) 40 MG tablet Take 1 tablet (40 mg total) by mouth 2 (two) times daily. 12/25/22   Hilarie Fredrickson, MD  polyethylene glycol (MIRALAX / GLYCOLAX) 17 g packet Take 17 g by mouth daily as needed for mild constipation.    [provider]  potassium chloride SA (KLOR-CON M) 20 MEQ tablet Take 1 tablet (20 mEq total) by mouth daily for 1 day. 10/29/22 11/16/22  Avanell Shackleton, NP-C  Pramipexole Dihydrochloride 0.75 MG TB24 Take 1 tablet (0.75 mg total) by mouth in the morning and at bedtime. 06/25/22   [name], [name], NP  QUEtiapine (SEROQUEL) 100 MG tablet TAKE ONE TABLET BY MOUTH AT BEDTIME 01/18/23   Plotnikov, Georgina Quint, MD  rosuvastatin (CRESTOR) 10 MG tablet TAKE ONE TABLET BY MOUTH EVERY DAY 12/04/22   Plotnikov, Georgina Quint, MD  solifenacin (VESICARE) 5 MG tablet TAKE ONE TABLET BY MOUTH EVERY DAY 01/18/23   Plotnikov, Georgina Quint, MD  sucralfate (CARAFATE) 1 g tablet Take 1  tablet (1 g total) by mouth 4 (four) times daily -  with meals and at bedtime. 10/21/22 11/20/22  Narda Bonds, MD  traMADol (ULTRAM) 50 MG tablet Take 1-2 tablets (50-100 mg total) by mouth every 8 (eight) hours as needed for severe pain (pain score 7-10). 12/21/22   Plotnikov, Georgina Quint, MD  donepezil (ARICEPT) 5 MG tablet Take 1 tablet (5 mg total) by mouth at bedtime. 11/27/19 07/23/20  Plotnikov, Georgina Quint, MD  venlafaxine XR (EFFEXOR-XR) 150 MG 24 hr capsule TAKE 1 CAPSULE BY MOUTH  EVERY DAY WITH BREAKFAST 05/29/19 07/23/20  Plotnikov, Georgina Quint, MD    Physical Exam: Vitals:   04/12/23 1515 04/12/23 1616 04/12/23 1622 04/12/23 1700  BP: (!) 144/87 (!) 145/60    Pulse: (!) 108     Resp: (!) 26 20    Temp:   98.1 F (36.7 C)   TempSrc:   Oral   SpO2:      Height:    5' 1.5" (1.562 m)    Physical Exam Constitutional:      General: She is not in acute distress.    Appearance: Normal appearance.  HENT:     Head: Normocephalic and atraumatic.     Mouth/Throat:     Mouth: Mucous membranes are moist.     Pharynx: Oropharynx is clear.  Eyes:     Extraocular Movements: Extraocular movements intact.     Pupils: Pupils are equal, round, and reactive to light.  Cardiovascular:     Rate and Rhythm: Regular rhythm. Tachycardia present.     Pulses: Normal pulses.     Heart sounds: Normal heart sounds.  Pulmonary:     Effort: Pulmonary effort is normal. No respiratory distress.     Breath sounds: Normal breath sounds.  Abdominal:     General: Bowel sounds are normal. There is no distension.     Palpations: Abdomen is soft.     Tenderness: There is no abdominal tenderness.  Musculoskeletal:        General: No swelling or deformity.  Skin:  General: Skin is warm and dry.  Neurological:     General: No focal deficit present.     Mental Status: Mental status is at baseline.     Comments: tremor    Labs on Admission: I have personally reviewed following labs and imaging  studies  CBC: Recent Labs  Lab 04/12/23 1243  WBC 12.9*  HGB 6.2*  HCT 18.7*  MCV 89.5  PLT 243    Basic Metabolic Panel: Recent Labs  Lab 04/12/23 1408  NA 140  K 4.1  CL 106  CO2 29  GLUCOSE 119*  BUN 45*  CREATININE 1.27*  CALCIUM 8.4*    GFR: CrCl cannot be calculated (Unknown ideal weight.).  Liver Function Tests: Recent Labs  Lab 04/12/23 1614  AST 18  ALT 11  ALKPHOS 37*  BILITOT 0.5  PROT 5.9*  ALBUMIN 3.3*    Urine analysis:    Component Value Date/Time   COLORURINE YELLOW 10/18/2022 2100   APPEARANCEUR CLEAR 10/18/2022 2100   LABSPEC 1.027 10/18/2022 2100   PHURINE 5.0 10/18/2022 2100   GLUCOSEU NEGATIVE 10/18/2022 2100   GLUCOSEU NEGATIVE 06/25/2022 1531   HGBUR SMALL (A) 10/18/2022 2100   BILIRUBINUR NEGATIVE 10/18/2022 2100   KETONESUR NEGATIVE 10/18/2022 2100   PROTEINUR 30 (A) 10/18/2022 2100   UROBILINOGEN 4.0 (A) 06/25/2022 1531   NITRITE NEGATIVE 10/18/2022 2100   LEUKOCYTESUR NEGATIVE 10/18/2022 2100    Radiological Exams on Admission: No results found.  EKG: Not performed in the emergency department.  Assessment/Plan Principal Problem:   Symptomatic anemia Active Problems:   Morbid obesity   Essential hypertension   Asthma   GERD (gastroesophageal reflux disease)   OSA (obstructive sleep apnea)   Low back pain   Parkinson disease (HCC)   S/P lumbar laminectomy   Memory loss   Anxiety   OAB (overactive bladder)   Spinal stenosis of lumbar region without neurogenic claudication   Gastritis   Peptic ulcer disease   Symptomatic anemia GI bleed PUD Gastritis Esophagitis ?Vaginal bleeding > Patient presenting with 2 to 3 days of melena and possible vaginal bleeding per her report.  Low suspicion for vaginal bleed at this point. > Rectal exam in the ED showed melena and was positive on FOBT.  No evidence of vaginal bleeding on exam in ED. > Hemoglobin now 6.2 down from 11.43 months ago. > Previous GI bleed last  year did not require transfusion and endoscopy showed evidence of PUD and esophagitis. > Patient remains hemodynamically stable.  Transferred from [facility] urgently for type and screen and transfusion. - Monitoring on progressive unit overnight - Appreciate GI recommendations and assistance, they plan to see the morning of 2/18. - Continue IV PPI infusion - Stat type and screen, and repeat CBC - Transfuse 2 units - Trend hemoglobin - Further transfusion for hemoglobin less than 7 - Supportive care Addendum > Patient hemoglobin dropped from 6.2-5.4 on repeat labs over the course of 5 hours.  This was also in the setting of IV fluid administration. > First unit of blood is transfusion, suspect we will be able to catch up to a normal hemoglobin with these 2 units at bedtime and recheck, likely in 5 or 6 hours. - Repeat hemoglobin after transfusion - Continue to transfuse for hemoglobin less than 7  Hypertension - Holding hydrochlorothiazide in the setting of ongoing bleed  Hyperlipidemia - Continue home rosuvastatin  Anxiety - Continue home Seroquel and hydroxyzine  Parkinson disease - Continue on pramipexole  OSA -  No longer on CPAP  OAB - Replace home Vesicare with formulary Toviaz  Obesity - Noted  Low back pain Spinal stenosis History of laminectomy - Noted  DVT prophylaxis: SCDs Code Status:   Full Family Communication:  Attempted to reach husband, Fayrene Fearing, by phone but there was no answer. Disposition Plan:   Patient is from:  Home  Anticipated DC to:  Home  Anticipated DC date:  2 to 5 days  Anticipated DC barriers: None  Consults called:  Gastroenterology Admission status:  Inpatient, progressive  Severity of Illness: The appropriate patient status for this patient is INPATIENT. Inpatient status is judged to be reasonable and necessary in order to provide the required intensity of service to ensure the patient's safety. The patient's presenting symptoms,  physical exam findings, and initial radiographic and laboratory data in the context of their chronic comorbidities is felt to place them at high risk for further clinical deterioration. Furthermore, it is not anticipated that the patient will be medically stable for discharge from the hospital within 2 midnights of admission.   * I certify that at the point of admission it is my clinical judgment that the patient will require inpatient hospital care spanning beyond 2 midnights from the point of admission due to high intensity of service, high risk for further deterioration and high frequency of surveillance required.Synetta Fail MD [facility]  How to contact the [facility] Attending or Consulting provider 7A - 7P or covering provider during after hours 7P -7A, for this patient?   Check the care team in [facility] and look for a) attending/consulting [facility] provider listed and b) the [facility] team listed Log into [email or web address] and use [place]'s universal password to access. If you do not have the password, please contact the hospital operator. Locate the [facility] provider you are looking for under [facility] and page to a number that you can be directly reached. If you still have difficulty reaching the provider, please page the [facility] (Director on Call) for the Hospitalists listed on amion for assistance.  04/12/2023, 5:52 PM

## 2023-04-13 DIAGNOSIS — D649 Anemia, unspecified: Secondary | ICD-10-CM | POA: Diagnosis not present

## 2023-04-13 DIAGNOSIS — K921 Melena: Secondary | ICD-10-CM | POA: Diagnosis not present

## 2023-04-13 LAB — COMPREHENSIVE METABOLIC PANEL
ALT: 13 U/L (ref 0–44)
AST: 19 U/L (ref 15–41)
Albumin: 2.5 g/dL — ABNORMAL LOW (ref 3.5–5.0)
Alkaline Phosphatase: 34 U/L — ABNORMAL LOW (ref 38–126)
Anion gap: 5 (ref 5–15)
BUN: 27 mg/dL — ABNORMAL HIGH (ref 8–23)
CO2: 25 mmol/L (ref 22–32)
Calcium: 7.9 mg/dL — ABNORMAL LOW (ref 8.9–10.3)
Chloride: 112 mmol/L — ABNORMAL HIGH (ref 98–111)
Creatinine, Ser: 1.24 mg/dL — ABNORMAL HIGH (ref 0.44–1.00)
GFR, Estimated: 45 mL/min — ABNORMAL LOW (ref >60)
Glucose, Bld: 100 mg/dL — ABNORMAL HIGH (ref 70–99)
Potassium: 3.4 mmol/L — ABNORMAL LOW (ref 3.5–5.1)
Sodium: 142 mmol/L (ref 135–145)
Total Bilirubin: 0.8 mg/dL (ref 0.0–1.2)
Total Protein: 4.9 g/dL — ABNORMAL LOW (ref 6.5–8.1)

## 2023-04-13 LAB — CBC
HCT: 21.3 % — ABNORMAL LOW (ref 36.0–46.0)
HCT: 29.8 % — ABNORMAL LOW (ref 36.0–46.0)
HCT: 28.9 % — ABNORMAL LOW (ref 36.0–46.0)
Hemoglobin: 7.3 g/dL — ABNORMAL LOW (ref 12.0–15.0)
Hemoglobin: 10 g/dL — ABNORMAL LOW (ref 12.0–15.0)
Hemoglobin: 9.7 g/dL — ABNORMAL LOW (ref 12.0–15.0)
MCH: 30.9 pg (ref 26.0–34.0)
MCH: 29.9 pg (ref 26.0–34.0)
MCH: 30.3 pg (ref 26.0–34.0)
MCHC: 34.3 g/dL (ref 30.0–36.0)
MCHC: 33.2 g/dL (ref 30.0–36.0)
MCHC: 33.6 g/dL (ref 30.0–36.0)
MCV: 90.3 fL (ref 80.0–100.0)
MCV: 90 fL (ref 80.0–100.0)
MCV: 90.3 fL (ref 80.0–100.0)
Platelets: 179 10*3/uL (ref 150–400)
Platelets: 207 10*3/uL (ref 150–400)
Platelets: 207 10*3/uL (ref 150–400)
RBC: 2.36 MIL/uL — ABNORMAL LOW (ref 3.87–5.11)
RBC: 3.31 MIL/uL — ABNORMAL LOW (ref 3.87–5.11)
RBC: 3.2 MIL/uL — ABNORMAL LOW (ref 3.87–5.11)
RDW: 15.8 % — ABNORMAL HIGH (ref 11.5–15.5)
RDW: 16.2 % — ABNORMAL HIGH (ref 11.5–15.5)
RDW: 16.1 % — ABNORMAL HIGH (ref 11.5–15.5)
WBC: 10.4 10*3/uL (ref 4.0–10.5)
WBC: 12.5 10*3/uL — ABNORMAL HIGH (ref 4.0–10.5)
WBC: 10.8 10*3/uL — ABNORMAL HIGH (ref 4.0–10.5)
nRBC: 0 % (ref 0.0–0.2)
nRBC: 0.2 % (ref 0.0–0.2)
nRBC: 0.3 % — ABNORMAL HIGH (ref 0.0–0.2)

## 2023-04-13 LAB — GLUCOSE, CAPILLARY: Glucose-Capillary: 93 mg/dL (ref 70–99)

## 2023-04-13 LAB — PREPARE RBC (CROSSMATCH)

## 2023-04-13 MED ORDER — PEG-KCL-NACL-NASULF-NA ASC-C 100 G PO SOLR
0.5000 | Freq: Once | ORAL | Status: AC
  Administered 2023-04-14: 100 g via ORAL

## 2023-04-13 MED ORDER — PANTOPRAZOLE SODIUM 40 MG IV SOLR
40.0000 mg | Freq: Four times a day (QID) | INTRAVENOUS | Status: AC
  Administered 2023-04-13 – 2023-04-15 (×11): 40 mg via INTRAVENOUS
  Filled 2023-04-13 (×11): qty 10

## 2023-04-13 MED ORDER — PEG-KCL-NACL-NASULF-NA ASC-C 100 G PO SOLR: 1.0000 | Freq: Once | ORAL | Status: DC

## 2023-04-13 MED ORDER — PANTOPRAZOLE SODIUM 40 MG IV SOLR
40.0000 mg | Freq: Two times a day (BID) | INTRAVENOUS | Status: DC
  Administered 2023-04-16: 40 mg via INTRAVENOUS
  Filled 2023-04-13: qty 10

## 2023-04-13 MED ORDER — PANTOPRAZOLE SODIUM 40 MG IV SOLR: 40.0000 mg | Freq: Two times a day (BID) | INTRAVENOUS | Status: DC

## 2023-04-13 MED ORDER — PEG-KCL-NACL-NASULF-NA ASC-C 100 G PO SOLR
0.5000 | Freq: Once | ORAL | Status: AC
  Administered 2023-04-13: 100 g via ORAL
  Filled 2023-04-13: qty 1

## 2023-04-13 MED ORDER — SODIUM CHLORIDE 0.9 % IV SOLN: INTRAVENOUS | Status: AC

## 2023-04-13 MED ORDER — PANTOPRAZOLE SODIUM 40 MG IV SOLR: 40.0000 mg | Freq: Three times a day (TID) | INTRAVENOUS | Status: DC

## 2023-04-13 MED ORDER — POTASSIUM CHLORIDE CRYS ER 20 MEQ PO TBCR
40.0000 meq | EXTENDED_RELEASE_TABLET | Freq: Once | ORAL | Status: DC
Start: 2023-04-13 — End: 2023-04-13

## 2023-04-13 MED ORDER — HYDRALAZINE HCL 50 MG PO TABS
50.0000 mg | ORAL_TABLET | Freq: Three times a day (TID) | ORAL | Status: DC
  Administered 2023-04-13 – 2023-04-16 (×6): 50 mg via ORAL
  Filled 2023-04-13 (×6): qty 1

## 2023-04-13 MED ORDER — SODIUM CHLORIDE 0.9% IV SOLUTION: Freq: Once | INTRAVENOUS | Status: AC

## 2023-04-13 MED ORDER — POTASSIUM CHLORIDE 10 MEQ/100ML IV SOLN
10.0000 meq | INTRAVENOUS | Status: AC
  Administered 2023-04-13 (×4): 10 meq via INTRAVENOUS
  Filled 2023-04-13 (×4): qty 100

## 2023-04-13 MED ORDER — AMLODIPINE BESYLATE 10 MG PO TABS
10.0000 mg | ORAL_TABLET | Freq: Every day | ORAL | Status: DC
  Administered 2023-04-13 – 2023-04-16 (×3): 10 mg via ORAL
  Filled 2023-04-13 (×3): qty 1

## 2023-04-13 NOTE — Plan of Care (Signed)
  Problem: Elimination: Goal: Will not experience complications related to bowel motility Outcome: Progressing   Problem: Elimination: Goal: Will not experience complications related to urinary retention Outcome: Progressing   Problem: Pain Managment: Goal: General experience of comfort will improve and/or be controlled Outcome: Progressing   Problem: Safety: Goal: Ability to remain free from injury will improve Outcome: Progressing

## 2023-04-13 NOTE — Consult Note (Addendum)
 Referring Provider: Dr. Susa Raring Primary Care Physician:  Tresa Garter, MD Primary Gastroenterologist:  Dr. Yancey Flemings  Reason for Consultation:  Anemia, positive FOBT  HPI: CHARNAY NAZARIO is a 78 y.o. female with a past medical history of anxiety, asthma, hypertension, OSA, memory impairment, Parkinson's disease, spinal stenosis, iron deficiency anemia, GERD, UGI bleed secondary to esophagitis and PUD 09/2022, diverticulosis and colon polyps.  She presented to [facility] 04/12/2023 with melena x 2 days and possible vaginal bleeding as documented by the ED physician. Labs in the ED showed a Hg level of 6.2 down from 11.4 on 12/21/2022. HCT 18.7. PLT 243. BUN 45. Cr. 1.27 (BUN 16 and Cr 1.19 on 12/21/2022).  T. Bili 0.5. Alk phos 37. AST 18. ALT 11. INR 1.2. FOBT positive.  Rectal exam by the ED physician identified gross melena and no evidence of vaginal bleeding on exam. Transferred to [facility] for admission, blood transfusion and GI consult.   Repeat Hg 5.4. Transfused 2 units of PRBCs. Post transfusion Hg 7.3.   She endorses feeling quite well until she started passing loose stools with red blood on Saturday, 04/10/2023.  She denied having any abdominal pain but stated her stomach felt funny.  No dysphagia or heartburn.  She passed 1-2 episodes of red bloody diarrhea Sunday night into Monday morning.  She denies passing any black stools. No nausea or vomiting. No NSAID use. Not on blood thinners.  No significant hematochezia or bloody diarrhea since admission.  RN stated she passed a small smear of blood per the rectum last night.  She was previously admitted to the hospital 10/17/2022 due to having abdominal pain and diarrhea. CTAP during this admission showed thickening to the gastric antrum with questionable large proximal gastric ulcer.  She underwent an EGD 10/19/2022 which identified erosive esophagitis, peptic stricture and a large prepyloric/pyloric/proximal bulb ulcer,  biopsies were negative for malignancy.  She was treated with PPI and Carafate 4 times daily.  She subsequently underwent a repeat EGD as an outpatient which showed the duodenal ulcer was almost completely healed and an incidental distal esophageal stricture and a moderate hiatal hernia were noted.  She has a history of colon polyps.  Her most recent colonoscopy was 03/31/2018 which identified for tubular adenomatous polyps removed from the colon, diverticulosis to the left colon and internal hemorrhoids.  GI PROCEDURES:   EGD 12/25/2022 as an outpatient by Dr. Marina Goodell:  1. Duodenal ulcer almost completely healed. 2. Incidental distal esophageal stricture and moderate hiatal hernia 3. Otherwise unremarkable EGD.   EGD 10/19/2022 as an inpatient by Dr. Marina Goodell:  1. Erosive esophagitis  2. Peptic stricture 3. Large prepyloric/pyloric/proximal bulb ulcer as described. Biopsies taken. Pyloric biopsy: Scant fragment of pyloroduodenal junctional mucosa with reactive/reparative change in the background of abundant fragments of fibropurulent debris with evidence of bacterial overgrowth consistent with ulcerations  Colonoscopy 03/31/2018: - Four 2 to 8 mm polyps at the recto-sigmoid colon, in the descending colon and in the ascending colon, removed with a cold snare. Resected and retrieved. - Diverticulosis in the left colon.  - Internal hemorrhoids.  - The examination was otherwise normal on direct and retroflexion views.  - 3 year recall colonoscopy 1. Surgical [P], descending colon; polyp - TUBULAR ADENOMA (ONE). - NO HIGH GRADE DYSPLASIA OR MALIGNANCY. 2. Surgical [P], ascending colon, polyp - TUBULAR ADENOMA (EIGHT FRAGMENTS). - NO HIGH GRADE DYSPLASIA OR MALIGNANCY. 3. Surgical [P], ascending colon, polyp - TUBULAR ADENOMA (ONE). - NO HIGH GRADE  DYSPLASIA OR MALIGNANCY. 4. Surgical [P], recto/sigmoid, polyp - POLYPOID COLONIC MUCOSA WITH REACTIVE LYMPHOID AGGREGATES. - NO ADENOMATOUS CHANGE OR  CARCINOMA  Colonoscopy 10/11/2012: - Diverticulosis throughout the examined colon  EGD 08/18/2010: - Incidental stricture to the distal esophagus  Colonoscopy 01/22/2005: - Diverticulosis - Internal hemorrhoids  Bone marrow biopsy 05/19/2017: Bone Marrow, Aspirate,Biopsy, and Clot  - HYPERCELLULAR BONE MARROW WITH TRILINEAGE HEMATOPOIESIS. - A FEW SMALL LYMPHOID AGGREGATES PRESENT. - SEE COMMENT. PERIPHERAL BLOOD: - NORMOCYTIC-HYPOCHROMIC ANEMIA. - LEUKOCYTOSIS.  ECHO 11/20/2020: Prominent apical trabeculae; suggest cardiac MRI to R/O noncompaction. Left ventricular ejection fraction, by estimation, is 55 to 60%. The left ventricle has normal function. The left ventricle has no regional wall motion abnormalities. There is moderate left ventricular hypertrophy. Left ventricular diastolic parameters are consistent with Grade I diastolic dysfunction (impaired relaxation). 2. 3. Right ventricular systolic function is normal. The right ventricular size is normal. The mitral valve is normal in structure. Trivial mitral valve regurgitation. No evidence of mitral stenosis. 4. The aortic valve is tricuspid. Aortic valve regurgitation is trivial. No aortic stenosis is present. 5. The inferior vena cava is normal in size with greater than 50% respiratory variability, suggesting right atrial pressure of 3 mmHg.  Past Medical History:  Diagnosis Date   Abnormal CBC 02/05/2015   Dr Mosetta Putt 12/16 new: normocytic anemia and thrombocytosis   Adjustment disorder with mixed anxiety and depressed mood 02/15/2007   Chronic  Chronic pain Grief, stress Effexor XR  Dad died in 2024/02/12   Asthma    Diverticulosis of colon (without mention of hemorrhage)    Esophageal stricture    Essential hypertension 01/26/2007   Chronic Verapamil    Falls    "I blackout"   GERD (gastroesophageal reflux disease) 02/15/2007   Chronic     Heart murmur    Hyperglycemia 03/02/2011   Mild     Insomnia disorder 12/08/2018    10/20 Carbid/Lev dose was increased - c/o hard time falling asleep (6 am) getting up at 12 am, poor sleep. Try Temazepam 15-30 mg at 11-1:30 pm    Internal hemorrhoids without mention of complication    Iron deficiency anemia 04/23/2017   Mild neurocognitive disorder, likely due to Parkinson's disease 02/09/2019   OSA (obstructive sleep apnea) 02/14/2010   In lab study (May 2018): AHI 25. Only had 45 minutes of sleep secondary to frequent awakenings secondary to sleep apnea. autocpap 5-15 cm water.    Osteoarthritis    Parkinson disease (HCC) 2014-02-11   2015 2017  Primidone - d/c 2019 Parkinson's: [facility] IR. Dr Tat   Postherpetic trigeminal neuralgia 01/26/2007   Qualifier: Diagnosis of  By: Charlsie Quest RMA, [name]     Shortness of breath dyspnea    with allergies   Shoulder contusion 07/31/2020   severe R shoulder pain after a fall on 07/20/20, neck pain. There was a LOC. Pt went to ER 4 timeas. All CTs,X rays were (-)     Sleep apnea    Syncope and collapse 08/06/2020   Remote syncope on 07/20/2020.  Possible recurrent.  Cardiology consultation     Thrombocytosis 05/24/2017   Urinary incontinence 01/26/2007   Chronic  10/17 Detrol LA    Vitamin D deficiency 01/26/2019    Past Surgical History:  Procedure Laterality Date   ABDOMINAL HYSTERECTOMY     BIOPSY  10/19/2022   Procedure: BIOPSY;  Surgeon: Hilarie Fredrickson, MD;  Location: [facility];  Service: Gastroenterology;;   BREAST CYST EXCISION Left 10/25/1978  COLONOSCOPY     Multiple   ESOPHAGOGASTRODUODENOSCOPY (EGD) WITH PROPOFOL N/A 10/19/2022   Procedure: ESOPHAGOGASTRODUODENOSCOPY (EGD) WITH PROPOFOL;  Surgeon: Hilarie Fredrickson, MD;  Location: [facility];  Service: Gastroenterology;  Laterality: N/A;   LUMBAR LAMINECTOMY/DECOMPRESSION MICRODISCECTOMY Bilateral 02/20/2014   Procedure: LUMBAR TWO THREE, LUMBAR THREE FOUR LUMBAR LAMINECTOMY/DECOMPRESSION MICRODISCECTOMY 2 LEVELS;  Surgeon: Tia Alert, MD;  Location: [facility];   Service: Neurosurgery;  Laterality: Bilateral;   SPLENECTOMY     TIBIA FRACTURE SURGERY Right    TIBIA FRACTURE SURGERY Left    TOTAL KNEE ARTHROPLASTY Right 02/24/2011    Prior to Admission medications   Medication Sig Start Date End Date Taking? Authorizing Provider  cetirizine (ZYRTEC) 10 MG tablet TAKE ONE TABLET BY MOUTH EVERY DAY 09/21/22   Plotnikov, Georgina Quint, MD  Cholecalciferol (VITAMIN D3) 50 MCG (2000 UT) capsule Take 1 capsule (2,000 Units total) by mouth daily. 12/21/22   Plotnikov, Georgina Quint, MD  [employer] SENNA PLUS 8.6-50 MG tablet Take 1 tablet by mouth 2 (two) times daily. 09/26/20   [provider]  cyanocobalamin (VITAMIN B12) 1000 MCG/ML injection INJECT 1 ML INTO THE SKIN EVERY 14 DAYS 10/20/21   Corwin Levins, MD  diclofenac Sodium (VOLTAREN) 1 % GEL Apply 4 g topically daily as needed (pain). 04/25/13   [provider]  docusate sodium (COLACE) 100 MG capsule Take 100 mg by mouth at bedtime. 06/26/19   [provider]  hydrochlorothiazide (MICROZIDE) 12.5 MG capsule Take 1 capsule (12.5 mg total) by mouth daily. 01/19/23   Plotnikov, Georgina Quint, MD  hydrOXYzine (VISTARIL) 50 MG capsule Take 1 capsule (50 mg total) by mouth every 6 (six) hours as needed for anxiety. 10/21/22   Narda Bonds, MD  methocarbamol (ROBAXIN) 500 MG tablet Take 1 tablet (500 mg total) by mouth every 8 (eight) hours as needed for muscle spasms. 11/26/22   Plotnikov, Georgina Quint, MD  Multiple Vitamin (MULTIVITAMIN WITH MINERALS) TABS tablet Take 1 tablet by mouth daily. 10/22/22   Narda Bonds, MD  pantoprazole (PROTONIX) 40 MG tablet Take 1 tablet (40 mg total) by mouth 2 (two) times daily. 12/21/22   Plotnikov, Georgina Quint, MD  pantoprazole (PROTONIX) 40 MG tablet Take 1 tablet (40 mg total) by mouth 2 (two) times daily. 12/25/22   Hilarie Fredrickson, MD  polyethylene glycol (MIRALAX / GLYCOLAX) 17 g packet Take 17 g by mouth daily as needed for mild constipation.    [provider]   potassium chloride SA (KLOR-CON M) 20 MEQ tablet Take 1 tablet (20 mEq total) by mouth daily for 1 day. 10/29/22 04/12/23  Avanell Shackleton, NP-C  Pramipexole Dihydrochloride 0.75 MG TB24 Take 1 tablet (0.75 mg total) by mouth in the morning and at bedtime. 06/25/22   [name], [name], NP  QUEtiapine (SEROQUEL) 100 MG tablet TAKE ONE TABLET BY MOUTH AT BEDTIME 01/18/23   Plotnikov, Georgina Quint, MD  rosuvastatin (CRESTOR) 10 MG tablet TAKE ONE TABLET BY MOUTH EVERY DAY 12/04/22   Plotnikov, Georgina Quint, MD  solifenacin (VESICARE) 5 MG tablet TAKE ONE TABLET BY MOUTH EVERY DAY 01/18/23   Plotnikov, Georgina Quint, MD  sucralfate (CARAFATE) 1 g tablet Take 1 tablet (1 g total) by mouth 4 (four) times daily -  with meals and at bedtime. 10/21/22 11/20/22  Narda Bonds, MD  traMADol (ULTRAM) 50 MG tablet Take 1-2 tablets (50-100 mg total) by mouth every 8 (eight) hours as needed for severe pain (pain  score 7-10). 12/21/22   Plotnikov, Georgina Quint, MD  donepezil (ARICEPT) 5 MG tablet Take 1 tablet (5 mg total) by mouth at bedtime. 11/27/19 07/23/20  Plotnikov, Georgina Quint, MD  venlafaxine XR (EFFEXOR-XR) 150 MG 24 hr capsule TAKE 1 CAPSULE BY MOUTH  EVERY DAY WITH BREAKFAST 05/29/19 07/23/20  Plotnikov, Georgina Quint, MD    Current Facility-Administered Medications  Medication Dose Route Frequency Provider Last Rate Last Admin   0.9 %  sodium chloride infusion (Manually program via Guardrails IV Fluids)   Intravenous Once Leroy Sea, MD       0.9 %  sodium chloride infusion   Intravenous Continuous Synetta Fail, MD 125 mL/hr at 04/12/23 1808 New Bag at 04/12/23 1808   acetaminophen (TYLENOL) tablet 650 mg  650 mg Oral Q6H PRN Synetta Fail, MD       Or   acetaminophen (TYLENOL) suppository 650 mg  650 mg Rectal Q6H PRN Synetta Fail, MD       feeding supplement (BOOST / RESOURCE BREEZE) liquid 1 Container  1 Container Oral TID BM Synetta Fail, MD   1 Container at 04/12/23 2100   fesoterodine (TOVIAZ)  tablet 4 mg  4 mg Oral Daily Synetta Fail, MD       hydrOXYzine (ATARAX) tablet 50 mg  50 mg Oral Q6H PRN Synetta Fail, MD       pantoprazole (PROTONIX) injection 40 mg  40 mg Intravenous Q6H Eduard Clos, MD   40 mg at 04/13/23 0530   Followed by   Melene Muller ON 04/16/2023] pantoprazole (PROTONIX) injection 40 mg  40 mg Intravenous Q12H Eduard Clos, MD       pramipexole (MIRAPEX) tablet 0.75 mg  0.75 mg Oral BID Synetta Fail, MD   0.75 mg at 04/12/23 2234   QUEtiapine (SEROQUEL) tablet 100 mg  100 mg Oral QHS Synetta Fail, MD   100 mg at 04/12/23 2234   rosuvastatin (CRESTOR) tablet 10 mg  10 mg Oral Daily Synetta Fail, MD       sodium chloride flush (NS) 0.9 % injection 3 mL  3 mL Intravenous Q12H Synetta Fail, MD   3 mL at 04/12/23 2237    Allergies as of 04/12/2023 - Reviewed 04/12/2023  Allergen Reaction Noted   Ace inhibitors     Benicar [olmesartan]  07/28/2018   Aspirin Other (See Comments)    Citalopram hydrobromide Diarrhea 08/06/2008    Family History  Problem Relation Age of Onset   Hypertension Mother    Diabetes type II Mother    Asthma Mother    Diabetes Mother    Dementia Mother    Prostate cancer Father    Mental illness Father        dementia   Stroke Brother        died in prison   Suicidality Son    Colon cancer Neg Hx    Rectal cancer Neg Hx    Parkinson's disease Neg Hx    Colon polyps Neg Hx    Esophageal cancer Neg Hx    Stomach cancer Neg Hx    Ulcerative colitis Neg Hx     Social History   Socioeconomic History   Marital status: Married    Spouse name: Not on file   Number of children: 2   Years of education: 14   Highest education level: Associate degree: occupational, Scientist, product/process development, or vocational program  Occupational History   Occupation:  Retired    Comment: Diplomatic Services operational officer, admin assist   Tobacco Use   Smoking status: Never   Smokeless tobacco: Never  Vaping Use   Vaping status: Never  Used  Substance and Sexual Activity   Alcohol use: Yes    Comment: wine on occ    Drug use: No   Sexual activity: Not Currently  Other Topics Concern   Not on file  Social History Narrative   Drink tea and coffee on occ   Right handed   1 story    Lives with spouse   Social Drivers of Health   Financial Resource Strain: Low Risk  (06/24/2022)   Overall Financial Resource Strain (CARDIA)    Difficulty of Paying Living Expenses: Not hard at all  Food Insecurity: No Food Insecurity (04/12/2023)   Hunger Vital Sign    Worried About Running Out of Food in the Last Year: Never true    Ran Out of Food in the Last Year: Never true  Transportation Needs: No Transportation Needs (04/12/2023)   PRAPARE - Administrator, Civil Service (Medical): No    Lack of Transportation (Non-Medical): No  Physical Activity: Inactive (06/24/2022)   Exercise Vital Sign    Days of Exercise per Week: 0 days    Minutes of Exercise per Session: 0 min  Stress: No Stress Concern Present (06/24/2022)   [employer] of Occupational Health - Occupational Stress Questionnaire    Feeling of Stress : Not at all  Social Connections: Socially Integrated (04/12/2023)   Social Connection and Isolation Panel [NHANES]    Frequency of Communication with Friends and Family: Twice a week    Frequency of Social Gatherings with Friends and Family: Once a week    Attends Religious Services: More than 4 times per year    Active Member of [employer] or Organizations: Yes    Attends Banker Meetings: 1 to 4 times per year    Marital Status: Married  Catering manager Violence: Not At Risk (04/12/2023)   Humiliation, Afraid, Rape, and Kick questionnaire    Fear of Current or Ex-Partner: No    Emotionally Abused: No    Physically Abused: No    Sexually Abused: No   Review of Systems: Gen: Denies fever, sweats or chills. No weight loss.  CV: Denies chest pain, palpitations or edema. Resp: Denies cough,  shortness of breath of hemoptysis.  GI: See HPI. GU : Denies urinary burning, blood in urine, increased urinary frequency or incontinence. MS: + Back pain. Derm: Denies rash, itchiness, skin lesions or unhealing ulcers. Psych: + Anxiety, mild memory impairment.  Heme: Denies easy bruising, bleeding. Neuro:  Denies headaches, dizziness or paresthesias. Endo:  Denies any problems with DM, thyroid or adrenal function.  Physical Exam: Vital signs in last 24 hours: Temp:  [98 F (36.7 C)-99.1 F (37.3 C)] 98.3 F (36.8 C) (02/18 0604) Pulse Rate:  [82-109] 86 (02/18 0635) Resp:  [12-26] 22 (02/18 0635) BP: (86-157)/(41-87) 134/68 (02/18 0635) SpO2:  [93 %-100 %] 96 % (02/18 0635) Last BM Date : 04/12/23 General: 78 year old female in no acute distress. Head:  Normocephalic and atraumatic. Eyes:  No scleral icterus. Conjunctiva pink. Ears:  Normal auditory acuity. Nose:  No deformity, discharge or lesions. Mouth:  Dentition intact. No ulcers or lesions.  Neck:  Supple. No lymphadenopathy or thyromegaly.  Lungs: Expiratory wheezes throughout right and left lung fields. Heart: Regular rate and rhythm, 3/6 systolic murmur. Abdomen: Soft, nondistended.  Nontender.  Positive bowel sounds to all 4 quadrants.  Midline abdominal scar intact.  No bruit. Rectal: Deferred. Musculoskeletal:  Symmetrical without gross deformities.  Pulses:  Normal pulses noted. Extremities: Mild lateral lower extremity edema. Neurologic:  Alert and oriented x 4. Parkinson's tremors present.  Speech is clear.  Moves all extremities. Skin:  Intact without significant lesions or rashes. Psych:  Alert and cooperative. Normal mood and affect.  Intake/Output from previous day: 02/17 0701 - 02/18 0700 In: 987.5 [I.V.:3; Blood:984.5] Out: 500 [Urine:500] Intake/Output this shift: No intake/output data recorded.  Lab Results: Recent Labs    04/12/23 1243 04/12/23 1726 04/13/23 0435  WBC 12.9* 13.2* 10.4  HGB  6.2* 5.4* 7.3*  HCT 18.7* 16.4* 21.3*  PLT 243 218 179   BMET Recent Labs    04/12/23 1408 04/13/23 0435  NA 140 142  K 4.1 3.4*  CL 106 112*  CO2 29 25  GLUCOSE 119* 100*  BUN 45* 27*  CREATININE 1.27* 1.24*  CALCIUM 8.4* 7.9*   LFT Recent Labs    04/12/23 1614 04/13/23 0435  PROT 5.9* 4.9*  ALBUMIN 3.3* 2.5*  AST 18 19  ALT 11 13  ALKPHOS 37* 34*  BILITOT 0.5 0.8  BILIDIR 0.2  --   IBILI 0.3  --    PT/INR Recent Labs    04/12/23 1614  LABPROT 15.4*  INR 1.2   Hepatitis Panel No results for input(s): "HEPBSAG", "HCVAB", "HEPAIGM", "HEPBIGM" in the last 72 hours.    Studies/Results: No results found.  IMPRESSION/PLAN:  78 year old female with a history of GERD, hepatitis, upper GI bleed secondary to a large prepyloric/duodenal bulb ulcer 09/2022 admitted to the hospital UGI vs lower GI bleed.  Rectal exam by the ED physician identified gross melena. Patient endorses passing red blood with loose/diarrhea bowel movements 2/15 - 04/12/2023, no black stools. Admission Hg 6.2 -> 5.2 (baseline Hg 10 - 11s). Transfused 2 units of PRBCs -> Hg 7.3 -> one unit of PRBCs transfused earlier this morning. Hemodynamically stable. -Check H/H post transfusion -Clear liquid diet -EGD and colonoscopy tomorrow with Dr. Leonides Schanz, benefits and risks discussed including risk with sedation, risk of bleeding, perforation and infection -Continue PPI IV Bid -Continue to monitor the patient closely for active GI bleeding  Acute on chronic anemia. Admission Hg 6.2 -> 5.2 (baseline Hg 10 - 11s). Transfused 2 units of PRBCs -> Hg 7.3 -> transfused one unit of PRBCs earlier this morning. -Await posttransfusion H&H -Transfuse for hemoglobin less than 7.5 or as needed if symptomatic -CBC in a.m.  History of colon polyps. Colonoscopy 03/2018 identified for tubular adenomatous polyps removed from the colon.  History of diverticulosis  History of Parkinson's disease  AKI, Cr 1.27 -> 1.24.    Expiratory wheezes bilaterally on exam, no shortness of breath or cough. -Chest x-ray cancelled as repeat lung exam was clear.    Malachi Carl Kennedy-Smith  04/13/2023, 10:24 AM

## 2023-04-13 NOTE — Evaluation (Signed)
 Physical Therapy Evaluation Patient Details Name: Mallory Klein MRN: [id number] DOB: 11/10/1945 Today's Date: 04/13/2023  History of Present Illness  Pt is a 78 y.o. female admitted 2/17 with GI bleed and symptomatic anemia. PMH: HTN, GERD, gastritis, PUD, anemia, asthma, anxiety, memory loss, Parkinson's disease, OSA, OAB, obesity, low back pain, spinal stenosis, s/p laminectomy   Clinical Impression  Pt admitted with above diagnosis. PTA pt lived at home with her husband, mod I household mobility and basic ADLs with rollator. Pt currently with functional limitations due to the deficits listed below (see PT Problem List). On eval, pt required CGA transfers and amb 100' with rollator. Pt fatigued quickly, demo 2/4 DOE. SpO2 stable on RA. Resting HR 98. HR into 120s with mobility. Pt will benefit from acute skilled PT to increase their independence and safety with mobility to allow discharge. Upon d/c, pt would benefit from HHPT. She would also benefit from wheelchair for community distances.           If plan is discharge home, recommend the following: A little help with walking and/or transfers;A little help with bathing/dressing/bathroom;Assistance with cooking/housework;Assist for transportation   Can travel by private Programmer, multimedia (measurements PT);Wheelchair cushion (measurements PT)  Recommendations for Other Services       Functional Status Assessment Patient has had a recent decline in their functional status and demonstrates the ability to make significant improvements in function in a reasonable and predictable amount of time.     Precautions / Restrictions Precautions Precautions: Fall;Other (comment) Precaution/Restrictions Comments: watch HR      Mobility  Bed Mobility               General bed mobility comments: Pt in recliner.    Transfers Overall transfer level: Needs assistance Equipment used: Rollator (4  wheels) Transfers: Sit to/from Stand Sit to Stand: Contact guard assist                Ambulation/Gait Ambulation/Gait assistance: Contact guard assist Gait Distance (Feet): 100 Feet Assistive device: Rollator (4 wheels) Gait Pattern/deviations: Step-through pattern, Trunk flexed, Decreased stride length Gait velocity: decreased Gait velocity interpretation: <1.31 ft/sec, indicative of household ambulator   General Gait Details: Mildly unsteady. Fatigued quickly. 2/4 DOE. HR into 120s. SpO2 stable.  Stairs            Wheelchair Mobility     Tilt Bed    Modified Rankin (Stroke Patients Only)       Balance Overall balance assessment: Needs assistance Sitting-balance support: No upper extremity supported, Feet supported Sitting balance-Leahy Scale: Fair     Standing balance support: Bilateral upper extremity supported, During functional activity, Reliant on assistive device for balance Standing balance-Leahy Scale: Poor                               Pertinent Vitals/Pain Pain Assessment Pain Assessment: No/denies pain    Home Living Family/patient expects to be discharged to:: Private residence Living Arrangements: Spouse/significant other Available Help at Discharge: Family;Available 24 hours/day Type of Home: House Home Access: Level entry       Home Layout: One level Home Equipment: Rollator (4 wheels);Shower seat      Prior Function Prior Level of Function : Independent/Modified Independent             Mobility Comments: amb household distances with rollator ADLs Comments: mod I basic ADLs,  husband does most of the cooking, husband drives     Extremity/Trunk Assessment   Upper Extremity Assessment Upper Extremity Assessment: Generalized weakness    Lower Extremity Assessment Lower Extremity Assessment: Generalized weakness    Cervical / Trunk Assessment Cervical / Trunk Assessment: Kyphotic  Communication    Communication Communication: No apparent difficulties    Cognition Arousal: Alert Behavior During Therapy: WFL for tasks assessed/performed   PT - Cognitive impairments: No apparent impairments                         Following commands: Intact       Cueing       General Comments General comments (skin integrity, edema, etc.): Resting HR 98. HR into 120s with mobility. 2/4 DOE. SpO2 stable.    Exercises     Assessment/Plan    PT Assessment Patient needs continued PT services  PT Problem List Decreased strength;Decreased balance;Decreased mobility;Decreased activity tolerance       PT Treatment Interventions Functional mobility training;Balance training;Patient/family education;Gait training;Therapeutic activities;Therapeutic exercise    PT Goals (Current goals can be found in the Care Plan section)  Acute Rehab PT Goals Patient Stated Goal: home PT Goal Formulation: With patient Time For Goal Achievement: 04/27/23 Potential to Achieve Goals: Good    Frequency Min 1X/week     Co-evaluation               AM-PAC PT "6 Clicks" Mobility  Outcome Measure Help needed turning from your back to your side while in a flat bed without using bedrails?: A Little Help needed moving from lying on your back to sitting on the side of a flat bed without using bedrails?: A Little Help needed moving to and from a bed to a chair (including a wheelchair)?: A Little Help needed standing up from a chair using your arms (e.g., wheelchair or bedside chair)?: A Little Help needed to walk in hospital room?: A Little Help needed climbing 3-5 steps with a railing? : Total 6 Click Score: 16    End of Session Equipment Utilized During Treatment: Gait belt Activity Tolerance: Patient tolerated treatment well Patient left: in chair;with call bell/phone within reach;with chair alarm set Nurse Communication: Mobility status PT Visit Diagnosis: Muscle weakness (generalized)  (M62.81);Difficulty in walking, not elsewhere classified (R26.2)    Time: 1610-9604 PT Time Calculation (min) (ACUTE ONLY): 23 min   Charges:   PT Evaluation $PT Eval Moderate Complexity: 1 Mod   PT General Charges $$ ACUTE PT VISIT: 1 Visit         Ferd Glassing., PT  Office # [phone]   Ilda Foil 04/13/2023, 12:23 PM

## 2023-04-13 NOTE — Progress Notes (Signed)
 PROGRESS NOTE                                                                                                                                                                                                             Patient Demographics:    Mallory Klein, is a 78 y.o. female, DOB - 1945/02/24, WUJ:[id number]  Outpatient Primary MD for the patient is Plotnikov, Georgina Quint, MD    LOS - 1  Admit date - 04/12/2023    Chief Complaint  Patient presents with   Rectal Bleeding       Brief Narrative (HPI from H&P)   78 y.o. female with medical history significant of hypertension, GERD, gastritis, PUD, anemia, asthma, anxiety, memory loss, Parkinson's disease, OSA, OAB, obesity, low back pain, spinal stenosis, status post laminectomy presenting with melena and possible vaginal bleeding.   Patient reports she has had 2 days of melanotic stools.  She also suspicious for possible vaginal bleeding.  Denies any recent NSAID use.  Does report abdominal pain.  Has had admission for GI bleed in August of last year secondary to PUD and esophagitis.  Did not require transfusion at that time.     Subjective:    [name] today has, No headache, No chest pain, No abdominal pain - No Nausea, No new weakness tingling or numbness, no SOB   Assessment  & Plan :    Symptomatic anemia - UGI bleed, H/O PUD/Gastritis/Esophagitis - ?Vaginal bleeding.  With acute blood loss related anemia > Patient 2-day history of melanotic stool, she initially thought she was having vaginal bleeding but when she came to the ER it was frank melena, she has received 2 units of packed RBC on the day of admission and getting 1/3 unit morning of 04/13/2023, on IV PPI, no signs of ongoing active fresh bleeding, CBC will be monitored closely GI on board likely EGD later on 04/13/2023.   Hypertension   - Holding hydrochlorothiazide in the setting of ongoing bleed    Hyperlipidemia  - Continue home rosuvastatin   Anxiety  - Continue home Seroquel and hydroxyzine   Parkinson disease  - Continue on pramipexole   OSA  - No longer on CPAP   OAB - Replace home Vesicare with formulary Toviaz   Obesity - follow with PCP for weight loss.   Low back  pain - Spinal stenosis - History of laminectomy  - Noted, supportive care        Condition -   Guarded  Family Communication  : called Husband Fayrene Fearing [phone]  and a detailed message left on 04/13/2023 at 7:58 AM  Code Status :  Full  Consults  :  GI  PUD Prophylaxis : PPI   Procedures  :            Disposition Plan  :    Status is: Inpatient   DVT Prophylaxis  :    SCDs Start: 04/12/23 1745    Lab Results  Component Value Date   PLT 179 04/13/2023    Diet :  Diet Order             Diet NPO time specified  Diet effective now                    Inpatient Medications  Scheduled Meds:  sodium chloride   Intravenous Once   feeding supplement  1 Container Oral TID BM   fesoterodine  4 mg Oral Daily   pantoprazole (PROTONIX) IV  40 mg Intravenous Q6H   Followed by   Melene Muller ON 04/16/2023] pantoprazole (PROTONIX) IV  40 mg Intravenous Q12H   pramipexole  0.75 mg Oral BID   QUEtiapine  100 mg Oral QHS   rosuvastatin  10 mg Oral Daily   sodium chloride flush  3 mL Intravenous Q12H   Continuous Infusions:  sodium chloride 125 mL/hr at 04/12/23 1808   potassium chloride     PRN Meds:.acetaminophen **OR** acetaminophen, hydrOXYzine  Antibiotics  :    Anti-infectives (From admission, onward)    None         Objective:   Vitals:   04/13/23 0300 04/13/23 0400 04/13/23 0604 04/13/23 0635  BP: (!) 103/51 110/66 125/65 134/68  Pulse: 83 82 84 86  Resp:   16 (!) 22  Temp:  98.6 F (37 C) 98.3 F (36.8 C)   TempSrc:   Oral   SpO2: 97% 96% 99% 96%  Height:        Wt Readings from Last 3 Encounters:  12/25/22 57.6 kg  11/26/22 57.6 kg  11/18/22 57.9 kg      Intake/Output Summary (Last 24 hours) at 04/13/2023 0758 Last data filed at 04/13/2023 0604 Gross per 24 hour  Intake 987.5 ml  Output 500 ml  Net 487.5 ml     Physical Exam  Awake Alert, No new F.N deficits, Normal affect [place].AT,PERRAL Supple Neck, No JVD,   Symmetrical Chest wall movement, Good air movement bilaterally, CTAB RRR,No Gallops,Rubs or new Murmurs,  +ve B.Sounds, Abd Soft, No tenderness,   No Cyanosis, Clubbing or edema        Data Review:    Recent Labs  Lab 04/12/23 1243 04/12/23 1726 04/13/23 0435  WBC 12.9* 13.2* 10.4  HGB 6.2* 5.4* 7.3*  HCT 18.7* 16.4* 21.3*  PLT 243 218 179  MCV 89.5 90.1 90.3  MCH 29.7 29.7 30.9  MCHC 33.2 32.9 34.3  RDW 16.9* 17.2* 15.8*    Recent Labs  Lab 04/12/23 1408 04/12/23 1614 04/13/23 0435  NA 140  --  142  K 4.1  --  3.4*  CL 106  --  112*  CO2 29  --  25  ANIONGAP 5  --  5  GLUCOSE 119*  --  100*  BUN 45*  --  27*  CREATININE 1.27*  --  1.24*  AST  --  18 19  ALT  --  11 13  ALKPHOS  --  37* 34*  BILITOT  --  0.5 0.8  ALBUMIN  --  3.3* 2.5*  INR  --  1.2  --   CALCIUM 8.4*  --  7.9*      Recent Labs  Lab 04/12/23 1408 04/12/23 1614 04/13/23 0435  INR  --  1.2  --   CALCIUM 8.4*  --  7.9*     Micro Results No results found for this or any previous visit (from the past 240 hours).  Radiology Report No results found.   Signature  -   Susa Raring M.D on 04/13/2023 at 7:58 AM   -  To page go to [email or web address]

## 2023-04-14 ENCOUNTER — Encounter: Payer: Self-pay | Admitting: Hematology

## 2023-04-14 DIAGNOSIS — K921 Melena: Secondary | ICD-10-CM | POA: Diagnosis not present

## 2023-04-14 DIAGNOSIS — D649 Anemia, unspecified: Secondary | ICD-10-CM | POA: Diagnosis not present

## 2023-04-14 LAB — CBC WITH DIFFERENTIAL/PLATELET
Abs Immature Granulocytes: 0.03 10*3/uL (ref 0.00–0.07)
Basophils Absolute: 0 10*3/uL (ref 0.0–0.1)
Basophils Relative: 0 %
Eosinophils Absolute: 0.7 10*3/uL — ABNORMAL HIGH (ref 0.0–0.5)
Eosinophils Relative: 8 %
HCT: 27.3 % — ABNORMAL LOW (ref 36.0–46.0)
Hemoglobin: 9.1 g/dL — ABNORMAL LOW (ref 12.0–15.0)
Immature Granulocytes: 0 %
Lymphocytes Relative: 31 %
Lymphs Abs: 2.9 10*3/uL (ref 0.7–4.0)
MCH: 30.2 pg (ref 26.0–34.0)
MCHC: 33.3 g/dL (ref 30.0–36.0)
MCV: 90.7 fL (ref 80.0–100.0)
Monocytes Absolute: 1.1 10*3/uL — ABNORMAL HIGH (ref 0.1–1.0)
Monocytes Relative: 12 %
Neutro Abs: 4.7 10*3/uL (ref 1.7–7.7)
Neutrophils Relative %: 49 %
Platelets: 180 10*3/uL (ref 150–400)
RBC: 3.01 MIL/uL — ABNORMAL LOW (ref 3.87–5.11)
RDW: 16.3 % — ABNORMAL HIGH (ref 11.5–15.5)
WBC: 9.5 10*3/uL (ref 4.0–10.5)
nRBC: 0.4 % — ABNORMAL HIGH (ref 0.0–0.2)

## 2023-04-14 LAB — BASIC METABOLIC PANEL
Anion gap: 12 (ref 5–15)
BUN: 13 mg/dL (ref 8–23)
CO2: 24 mmol/L (ref 22–32)
Calcium: 8.7 mg/dL — ABNORMAL LOW (ref 8.9–10.3)
Chloride: 105 mmol/L (ref 98–111)
Creatinine, Ser: 1.03 mg/dL — ABNORMAL HIGH (ref 0.44–1.00)
GFR, Estimated: 56 mL/min — ABNORMAL LOW (ref >60)
Glucose, Bld: 97 mg/dL (ref 70–99)
Potassium: 3.5 mmol/L (ref 3.5–5.1)
Sodium: 141 mmol/L (ref 135–145)

## 2023-04-14 LAB — TYPE AND SCREEN
ABO/RH(D): A POS
Antibody Screen: NEGATIVE
Unit division: 0
Unit division: 0
Unit division: 0

## 2023-04-14 LAB — PHOSPHORUS: Phosphorus: 3.3 mg/dL (ref 2.5–4.6)

## 2023-04-14 LAB — CBC
HCT: 30.9 % — ABNORMAL LOW (ref 36.0–46.0)
Hemoglobin: 10.2 g/dL — ABNORMAL LOW (ref 12.0–15.0)
MCH: 30.1 pg (ref 26.0–34.0)
MCHC: 33 g/dL (ref 30.0–36.0)
MCV: 91.2 fL (ref 80.0–100.0)
Platelets: 244 10*3/uL (ref 150–400)
RBC: 3.39 MIL/uL — ABNORMAL LOW (ref 3.87–5.11)
RDW: 16.4 % — ABNORMAL HIGH (ref 11.5–15.5)
WBC: 9.9 10*3/uL (ref 4.0–10.5)
nRBC: 0.7 % — ABNORMAL HIGH (ref 0.0–0.2)

## 2023-04-14 LAB — BPAM RBC
Blood Product Expiration Date: 202503162359
Blood Product Expiration Date: 202503162359
Blood Product Expiration Date: 202503162359
ISSUE DATE / TIME: 202502180559
ISSUE DATE / TIME: 202502171829
ISSUE DATE / TIME: 202502172311
Unit Type and Rh: 6200
Unit Type and Rh: 6200
Unit Type and Rh: 6200

## 2023-04-14 LAB — MAGNESIUM: Magnesium: 1.8 mg/dL (ref 1.7–2.4)

## 2023-04-14 LAB — BRAIN NATRIURETIC PEPTIDE: B Natriuretic Peptide: 201.7 pg/mL — ABNORMAL HIGH (ref 0.0–100.0)

## 2023-04-14 LAB — HEMOGLOBIN AND HEMATOCRIT, BLOOD
HCT: 32.2 % — ABNORMAL LOW (ref 36.0–46.0)
Hemoglobin: 10.6 g/dL — ABNORMAL LOW (ref 12.0–15.0)

## 2023-04-14 MED ORDER — PEG-KCL-NACL-NASULF-NA ASC-C 100 G PO SOLR
0.5000 | Freq: Once | ORAL | Status: AC
  Administered 2023-04-14: 100 g via ORAL
  Filled 2023-04-14: qty 1

## 2023-04-14 MED ORDER — PEG-KCL-NACL-NASULF-NA ASC-C 100 G PO SOLR
1.0000 | Freq: Once | ORAL | Status: DC
Start: 2023-04-14 — End: 2023-04-14

## 2023-04-14 MED ORDER — PEG-KCL-NACL-NASULF-NA ASC-C 100 G PO SOLR
0.5000 | Freq: Once | ORAL | Status: AC
  Administered 2023-04-15: 100 g via ORAL
  Filled 2023-04-14: qty 1

## 2023-04-14 MED ORDER — ONDANSETRON HCL 4 MG/2ML IJ SOLN
4.0000 mg | Freq: Four times a day (QID) | INTRAMUSCULAR | Status: DC | PRN
  Administered 2023-04-14: 4 mg via INTRAVENOUS
  Filled 2023-04-14: qty 2

## 2023-04-14 NOTE — Anesthesia Preprocedure Evaluation (Signed)
 Anesthesia Evaluation  Patient identified by MRN, date of birth, ID band Patient awake    Reviewed: Allergy & Precautions, NPO status , Patient's Chart, lab work & pertinent test results  Airway Mallampati: III  TM Distance: >3 FB Neck ROM: Full  Mouth opening: Limited Mouth Opening  Dental no notable dental hx. (+) Teeth Intact   Pulmonary asthma , sleep apnea    Pulmonary exam normal breath sounds clear to auscultation       Cardiovascular hypertension, (-) angina (-) Past MI Normal cardiovascular exam Rhythm:Regular Rate:Normal     Neuro/Psych   Anxiety      Neuromuscular disease (parkinsons)    GI/Hepatic ,GERD  ,,  Endo/Other    Renal/GU      Musculoskeletal  (+) Arthritis ,    Abdominal   Peds  Hematology  (+) Blood dyscrasia, anemia Lab Results      Component                Value               Date                      WBC                      9.9                 04/14/2023                HGB                      10.2 (L)            04/14/2023                HCT                      30.9 (L)            04/14/2023                MCV                      91.2                04/14/2023                PLT                      244                 04/14/2023              Anesthesia Other Findings All: ace inhibitors, benicar, asa, citalopram  Reproductive/Obstetrics                             Anesthesia Physical Anesthesia Plan  ASA: 3  Anesthesia Plan: MAC   Post-op Pain Management:    Induction:   PONV Risk Score and Plan: Treatment may vary due to age or medical condition and Propofol infusion  Airway Management Planned: Natural Airway and Nasal Cannula  Additional Equipment:   Intra-op Plan:   Post-operative Plan:   Informed Consent: I have reviewed the patients History and Physical, chart, labs and discussed the procedure including the risks, benefits and  alternatives for the proposed anesthesia with  the patient or authorized representative who has indicated his/her understanding and acceptance.     Dental advisory given  Plan Discussed with: CRNA and Surgeon  Anesthesia Plan Comments: (EGD + colonoscopy for Melana anemia)       Anesthesia Quick Evaluation

## 2023-04-14 NOTE — Progress Notes (Signed)
 Physical Therapy Treatment Patient Details Name: Mallory Klein MRN: [id number] DOB: [birth year] Today's Date: 04/14/2023   History of Present Illness Pt is a 78 y.o. female admitted 2/17 with GI bleed and symptomatic anemia. PMH: HTN, GERD, gastritis, PUD, anemia, asthma, anxiety, memory loss, Parkinson's disease, OSA, OAB, obesity, low back pain, spinal stenosis, s/p laminectomy    PT Comments  Pt up in recliner on entry. RN reports pt has been doing bowel prep and has been incontinent of stool does not recommend ambulation but possibly standing up. As pt stands with CGA noted to be incontinent of stool in recliner. Performs stand pivot transfer with CGA to [facility] with continued incontinence. Pt cleaned up and with standing to clean her bottom continues to stool. RN in room. D/c plans remain appropriate. PT will continue to follow acutely.    If plan is discharge home, recommend the following: A little help with walking and/or transfers;A little help with bathing/dressing/bathroom;Assistance with cooking/housework;Assist for transportation   Can travel by private vehicle      Yes  Equipment Recommendations  Wheelchair (measurements PT);Wheelchair cushion (measurements PT)       Precautions / Restrictions Precautions Precautions: Fall;Other (comment) Precaution/Restrictions Comments: watch HR Restrictions Weight Bearing Restrictions Per Provider Order: No     Mobility  Bed Mobility               General bed mobility comments: Pt in recliner.    Transfers Overall transfer level: Needs assistance Equipment used: Rolling walker (2 wheels) Transfers: Sit to/from Stand, Bed to chair/wheelchair/BSC Sit to Stand: Contact guard assist   Step pivot transfers: Contact guard assist       General transfer comment: pt with incontinence of stool with standing due to ongoing bowel prep, contact guard for standing and pivoting to BSC. After cleaned up stood again and continued to  stool so sat back on BSC. RN aware          Balance Overall balance assessment: Needs assistance Sitting-balance support: No upper extremity supported, Feet supported Sitting balance-Leahy Scale: Fair     Standing balance support: Bilateral upper extremity supported, During functional activity, Reliant on assistive device for balance Standing balance-Leahy Scale: Poor                              Communication Communication Communication: No apparent difficulties  Cognition Arousal: Alert Behavior During Therapy: WFL for tasks assessed/performed   PT - Cognitive impairments: No apparent impairments                         Following commands: Intact            General Comments General comments (skin integrity, edema, etc.): VSS on RA      Pertinent Vitals/Pain Pain Assessment Pain Assessment: No/denies pain     PT Goals (current goals can now be found in the care plan section) Acute Rehab PT Goals Patient Stated Goal: home PT Goal Formulation: With patient Time For Goal Achievement: 04/27/23 Potential to Achieve Goals: Good Progress towards PT goals: Not progressing toward goals - comment    Frequency    Min 1X/week       AM-PAC PT "6 Clicks" Mobility   Outcome Measure  Help needed turning from your back to your side while in a flat bed without using bedrails?: A Little Help needed moving from lying on your back to sitting on  the side of a flat bed without using bedrails?: A Little Help needed moving to and from a bed to a chair (including a wheelchair)?: A Little Help needed standing up from a chair using your arms (e.g., wheelchair or bedside chair)?: A Little Help needed to walk in hospital room?: A Little Help needed climbing 3-5 steps with a railing? : Total 6 Click Score: 16    End of Session Equipment Utilized During Treatment: Gait belt Activity Tolerance: Patient tolerated treatment well;Treatment limited secondary to  medical complications (Comment) (incontinence of stool from bowel prep) Patient left: with call bell/phone within reach;Other (comment);with nursing/sitter in room (on [facility]) Nurse Communication: Mobility status PT Visit Diagnosis: Muscle weakness (generalized) (M62.81);Difficulty in walking, not elsewhere classified (R26.2)     Time: 8657-8469 PT Time Calculation (min) (ACUTE ONLY): 36 min  Charges:    $Therapeutic Activity: 8-22 mins $Self Care/Home Management: 8-22 PT General Charges $$ ACUTE PT VISIT: 1 Visit                     Mallory Klein B. Beverely Risen PT, DPT Acute Rehabilitation Services Please use secure chat or  Call Office [phone]    Mallory Klein 04/14/2023, 2:25 PM

## 2023-04-14 NOTE — TOC Initial Note (Signed)
 Transition of Care [facility]) - Initial/Assessment Note    Patient Details  Name: Mallory Klein MRN: [id number] Date of Birth: 02-Dec-1945  Transition of Care [facility]) CM/SW Contact:    Mearl Latin, LCSW Phone Number: 04/14/2023, 8:56 AM  Clinical Narrative:                 Patient admitted from home with spouse undergoing workup for anemia. TOC following for home health set up as recommended.     Barriers to Discharge: Continued Medical Work up   Patient Goals and CMS Choice            Expected Discharge Plan and Services   Discharge Planning Services: CM Consult   Living arrangements for the past 2 months: Single Family Home                                      Prior Living Arrangements/Services Living arrangements for the past 2 months: Single Family Home Lives with:: Spouse Patient language and need for interpreter reviewed:: Yes        Need for Family Participation in Patient Care: No (Comment) Care giver support system in place?: Yes (comment)   Criminal Activity/Legal Involvement Pertinent to Current Situation/Hospitalization: No - Comment as needed  Activities of Daily Living   ADL Screening (condition at time of admission) Independently performs ADLs?: No (Simultaneous filing. User may not have seen previous data.) Does the patient have a NEW difficulty with bathing/dressing/toileting/self-feeding that is expected to last >3 days?: No (Simultaneous filing. User may not have seen previous data.) Does the patient have a NEW difficulty with getting in/out of bed, walking, or climbing stairs that is expected to last >3 days?: No (Simultaneous filing. User may not have seen previous data.) Does the patient have a NEW difficulty with communication that is expected to last >3 days?: No (Simultaneous filing. User may not have seen previous data.) Is the patient deaf or have difficulty hearing?: No (Simultaneous filing. User may not have seen previous  data.) Does the patient have difficulty seeing, even when wearing glasses/contacts?: No (Simultaneous filing. User may not have seen previous data.) Does the patient have difficulty concentrating, remembering, or making decisions?: No (Simultaneous filing. User may not have seen previous data.)  Permission Sought/Granted                  Emotional Assessment       Orientation: : Oriented to Self, Oriented to Place, Oriented to  Time, Oriented to Situation Alcohol / Substance Use: Not Applicable Psych Involvement: No (comment)  Admission diagnosis:  Melena [K92.1] Symptomatic anemia [D64.9] Gastrointestinal hemorrhage, unspecified gastrointestinal hemorrhage type [K92.2] Anemia, unspecified type [D64.9] Patient Active Problem List   Diagnosis Date Noted   Melena 04/13/2023   Hematochezia 04/13/2023   Symptomatic anemia 04/12/2023   GI bleed 04/12/2023   Malnutrition of moderate degree 10/20/2022   Abnormal CT of the abdomen 10/19/2022   Gastric ulcer 10/17/2022   Peptic ulcer disease 10/17/2022   Leukocytosis 10/17/2022   Anemia 10/17/2022   Gastritis 09/17/2022   Abdominal pain 09/17/2022   Constipation 09/07/2022   CRI (chronic renal insufficiency), stage 3 (moderate) (HCC) 06/30/2022   Anxiety 10/05/2021   OAB (overactive bladder) 10/05/2021   Gait disorder 10/03/2021   Non compliance w medication regimen 09/25/2021   Hives 09/24/2021   Complete tear of right rotator cuff 08/22/2020   Fall  07/31/2020   Concussion 07/31/2020   Anosmia 05/02/2020   Grief reaction 03/27/2020   Memory loss 12/13/2019   Food poisoning 09/11/2019   B12 deficiency 06/14/2019   Mild neurocognitive disorder, likely due to Parkinson's disease 02/09/2019   Vitamin D deficiency 01/26/2019   Sweating profusely 12/08/2018   Insomnia disorder 12/08/2018   Cerumen impaction 10/19/2018   Lumbar facet arthropathy 02/10/2018   Iron deficiency anemia 04/23/2017   Cough 05/12/2016    Pseudophakia of both eyes 10/09/2015   Abnormal CBC 02/05/2015   Edema 10/15/2014   S/P lumbar laminectomy 02/20/2014   Pre-ulcerative calluses 02/09/2014   Parkinson disease (HCC) 02/09/2014   Cold sore 10/17/2013   Low back pain 07/25/2013   Nuclear sclerosis 02/08/2013   Heel ulcer (HCC) 03/08/2012   Rash 12/04/2011   Contracture of right knee 10/07/2011   Neck pain on left side 08/06/2011   Presence of unspecified artificial knee joint 04/22/2011   Hyperglycemia 03/02/2011   Acquired spondylolisthesis 12/25/2010   Degeneration of lumbosacral intervertebral disc 12/25/2010   Spinal stenosis of lumbar region without neurogenic claudication 12/25/2010   Arthritis of right knee 12/03/2010   Knee pain, right 09/08/2010   OSA (obstructive sleep apnea) 02/14/2010   BRONCHITIS, ACUTE 04/05/2009   Vertigo 12/02/2007   Otitis media 10/26/2007   Other malaise and fatigue 05/11/2007   Adjustment disorder with mixed anxiety and depressed mood 02/15/2007   GERD (gastroesophageal reflux disease) 02/15/2007   Osteoarthritis 02/15/2007   Postherpetic trigeminal neuralgia 01/26/2007   Herpes zoster 01/26/2007   Morbid obesity 01/26/2007   Migraine headaches 01/26/2007   Essential hypertension 01/26/2007   Asthma 01/26/2007   Urinary incontinence 01/26/2007   PCP:  Tresa Garter, MD Pharmacy:   [facility] - [place], [place] - [address] [place] [address] Phone: [phone] Fax: [phone]     Social Drivers of Health (SDOH) Social History: SDOH Screenings   Food Insecurity: No Food Insecurity (04/12/2023)  Housing: Low Risk  (04/12/2023)  Transportation Needs: No Transportation Needs (04/12/2023)  Utilities: Not At Risk (04/12/2023)  Alcohol Screen: Low Risk  (06/24/2022)  Depression (PHQ2-9): Low Risk  (11/16/2022)  Recent Concern: Depression (PHQ2-9) - Medium Risk (10/29/2022)  Financial Resource Strain: Low Risk  (06/24/2022)   Physical Activity: Inactive (06/24/2022)  Social Connections: Socially Integrated (04/12/2023)  Stress: No Stress Concern Present (06/24/2022)  Tobacco Use: Low Risk  (04/12/2023)   SDOH Interventions: Food Insecurity Interventions: Intervention Not Indicated Transportation Interventions: Intervention Not Indicated Utilities Interventions: Intervention Not Indicated Social Connections Interventions: Intervention Not Indicated   Readmission Risk Interventions    10/19/2022   11:03 AM  Readmission Risk Prevention Plan  Post Dischage Appt Complete  Medication Screening Complete  Transportation Screening Complete

## 2023-04-14 NOTE — Progress Notes (Signed)
 PROGRESS NOTE                                                                                                                                                                                                             Patient Demographics:    Mallory Klein, is a 78 y.o. female, DOB - January 30, 1946, WUJ:[id number]  Outpatient Primary MD for the patient is Plotnikov, Georgina Quint, MD    LOS - 2  Admit date - 04/12/2023    Chief Complaint  Patient presents with   Rectal Bleeding       Brief Narrative (HPI from H&P)    78 y.o. female with medical history significant of hypertension, GERD, gastritis, PUD, anemia, asthma, anxiety, memory loss, Parkinson's disease, OSA, OAB, obesity, low back pain, spinal stenosis, status post laminectomy presenting with melena and possible vaginal bleeding.   Patient reports she has had 2 days of melanotic stools.  She also suspicious for possible vaginal bleeding.  Denies any recent NSAID use.  Does report abdominal pain.  Has had admission for GI bleed in August of last year secondary to PUD and esophagitis.  Did not require transfusion at that time.     Subjective:    Mallory Klein today denies any complaints, she drank her bowel prep overnight, but still having dark-colored stools overnight, with no clear BM yet.     Assessment  & Plan :    Symptomatic acute blood loss anemia anemia Upper GI bleed, presents with melena and hematochezia H/O PUD/Gastritis/Esophagitis  -Significant drop from baseline hemoglobin of 11.4, two 5.4 on admission. -GI input greatly appreciated, plan for endoscopy, colonoscopy today, but patient still having significant amount of stool despite getting her bowel prep, so procedure postponed till tomorrow, continue with clear liquid diet today, to receive another bowel prep. -Transfused 3 units PRBC, hemoglobin has stabilized, continue to monitor closely and transfuse  as needed, hemoglobin 9.4 this morning. -Continue with IV PPI  Hypertension   -Blood pressures acceptable, continue to hold hydrochlorothiazide and keep on as needed hydralazine . -Pressure uncontrolled yesterday, but much improved after starting Norvasc and hydralazine.     Hyperlipidemia  - Continue home rosuvastatin   Anxiety  - Continue home Seroquel and hydroxyzine   Parkinson disease  - Continue on pramipexole   OSA  - No longer on CPAP  OAB - Replace home Vesicare with formulary Toviaz   Obesity - follow with PCP for weight loss.   Low back pain - Spinal stenosis - History of laminectomy  - Noted, supportive care        Condition -   Guarded  Family Communication  : Discussed with patient, none at bedside Code Status :  Full  Consults  :  GI  PUD Prophylaxis : PPI   Procedures  :            Disposition Plan  :    Status is: Inpatient   DVT Prophylaxis  :    SCDs Start: 04/12/23 1745    Lab Results  Component Value Date   PLT 180 04/14/2023    Diet :  Diet Order             Diet NPO time specified  Diet effective midnight           Diet clear liquid Fluid consistency: Thin  Diet effective now                    Inpatient Medications  Scheduled Meds:  amLODipine  10 mg Oral Daily   feeding supplement  1 Container Oral TID BM   fesoterodine  4 mg Oral Daily   hydrALAZINE  50 mg Oral Q8H   pantoprazole (PROTONIX) IV  40 mg Intravenous Q6H   Followed by   Melene Muller ON 04/16/2023] pantoprazole (PROTONIX) IV  40 mg Intravenous Q12H   peg 3350 powder  0.5 kit Oral Once   And   [START ON 04/15/2023] peg 3350 powder  0.5 kit Oral Once   pramipexole  0.75 mg Oral BID   QUEtiapine  100 mg Oral QHS   rosuvastatin  10 mg Oral Daily   sodium chloride flush  3 mL Intravenous Q12H   Continuous Infusions:  sodium chloride     PRN Meds:.acetaminophen **OR** acetaminophen, hydrOXYzine, ondansetron (ZOFRAN) IV  Antibiotics  :     Anti-infectives (From admission, onward)    None         Objective:   Vitals:   04/13/23 2000 04/14/23 0000 04/14/23 1210 04/14/23 1256  BP:  125/67 132/76 (!) 148/83  Pulse: (!) 116 96 97   Resp:   16   Temp:  98.4 F (36.9 C) 98 F (36.7 C)   TempSrc:  Oral Oral   SpO2: (!) 81% 97% 98%   Height:        Wt Readings from Last 3 Encounters:  12/25/22 57.6 kg  11/26/22 57.6 kg  11/18/22 57.9 kg     Intake/Output Summary (Last 24 hours) at 04/14/2023 1535 Last data filed at 04/14/2023 1000 Gross per 24 hour  Intake 360 ml  Output --  Net 360 ml     Physical Exam  Awake Alert, Oriented X 3, No new F.N deficits, Normal affect Symmetrical Chest wall movement, Good air movement bilaterally, CTAB RRR,No Gallops,Rubs or new Murmurs, No Parasternal Heave +ve B.Sounds, Abd Soft, No tenderness, No rebound - guarding or rigidity. No Cyanosis, Clubbing, trace edema        Data Review:    Recent Labs  Lab 04/12/23 1726 04/13/23 0435 04/13/23 1459 04/13/23 2246 04/14/23 0435 04/14/23 1234  WBC 13.2* 10.4 12.5* 10.8* 9.5  --   HGB 5.4* 7.3* 10.0* 9.7* 9.1* 10.6*  HCT 16.4* 21.3* 29.8* 28.9* 27.3* 32.2*  PLT 218 179 207 207 180  --   MCV 90.1  90.3 90.0 90.3 90.7  --   MCH 29.7 30.9 29.9 30.3 30.2  --   MCHC 32.9 34.3 33.2 33.6 33.3  --   RDW 17.2* 15.8* 16.2* 16.1* 16.3*  --   LYMPHSABS  --   --   --   --  2.9  --   MONOABS  --   --   --   --  1.1*  --   EOSABS  --   --   --   --  0.7*  --   BASOSABS  --   --   --   --  0.0  --     Recent Labs  Lab 04/12/23 1408 04/12/23 1614 04/13/23 0435 04/14/23 0435  NA 140  --  142 141  K 4.1  --  3.4* 3.5  CL 106  --  112* 105  CO2 29  --  25 24  ANIONGAP 5  --  5 12  GLUCOSE 119*  --  100* 97  BUN 45*  --  27* 13  CREATININE 1.27*  --  1.24* 1.03*  AST  --  18 19  --   ALT  --  11 13  --   ALKPHOS  --  37* 34*  --   BILITOT  --  0.5 0.8  --   ALBUMIN  --  3.3* 2.5*  --   INR  --  1.2  --   --   BNP   --   --   --  201.7*  MG  --   --   --  1.8  PHOS  --   --   --  3.3  CALCIUM 8.4*  --  7.9* 8.7*      Recent Labs  Lab 04/12/23 1408 04/12/23 1614 04/13/23 0435 04/14/23 0435  INR  --  1.2  --   --   BNP  --   --   --  201.7*  MG  --   --   --  1.8  CALCIUM 8.4*  --  7.9* 8.7*     Micro Results No results found for this or any previous visit (from the past 240 hours).  Radiology Report No results found.   Signature  -   Huey Bienenstock M.D on 04/14/2023 at 3:35 PM   -  To page go to [email or web address]

## 2023-04-14 NOTE — Plan of Care (Signed)

## 2023-04-14 NOTE — Progress Notes (Signed)
 [facility] Progress Note  CC: Anemia, positive FOBT     Subjective: Patient did not clear after completing 2 doses of movie prep therefore EGD/colonoscopy rescheduled for tomorrow.  She passed 15 small volume loose brown stools mixed with dark red blood as reported by his RN.  H&H being drawn at this time.  Patient endorses feeling a little weak without chest pain or shortness of breath.  She is sitting up in the chair at this time. RN at the bedside.    Objective:  Vital signs in last 24 hours: Temp:  [98.4 F (36.9 C)-99 F (37.2 C)] 98.4 F (36.9 C) (02/19 0000) Pulse Rate:  [91-116] 96 (02/19 0000) Resp:  [15-18] 18 (02/18 1800) BP: (125-194)/(67-100) 125/67 (02/19 0000) SpO2:  [81 %-97 %] 97 % (02/19 0000) Last BM Date : 04/13/23 General: Alert 78 year old female no acute distress. Heart: Regular rate and rhythm, systolic murmur. Pulm: Breath sounds clear throughout.  No crackles, wheezes or rhonchi. Abdomen: Soft, nondistended.  Nontender.  Positive bowel sounds to all 4 quadrants. Extremities: Mild bilateral lower extremity edema. Neurologic:  Alert and  oriented x 4.  Speech is clear.  Moves all extremities equally. Psych:  Alert and cooperative. Normal mood and affect.  Intake/Output from previous day: 02/18 0701 - 02/19 0700 In: 3124.7 [I.V.:2312.7; Blood:412; IV Piggyback:400] Out: -  Intake/Output this shift: Total I/O In: 360 [P.O.:360] Out: -   Lab Results: Recent Labs    04/13/23 1459 04/13/23 2246 04/14/23 0435  WBC 12.5* 10.8* 9.5  HGB 10.0* 9.7* 9.1*  HCT 29.8* 28.9* 27.3*  PLT 207 207 180   BMET Recent Labs    04/12/23 1408 04/13/23 0435 04/14/23 0435  NA 140 142 141  K 4.1 3.4* 3.5  CL 106 112* 105  CO2 29 25 24   GLUCOSE 119* 100* 97  BUN 45* 27* 13  CREATININE 1.27* 1.24* 1.03*  CALCIUM 8.4* 7.9* 8.7*   LFT Recent Labs    04/12/23 1614 04/13/23 0435  PROT 5.9* 4.9*  ALBUMIN 3.3* 2.5*  AST 18 19  ALT 11 13   ALKPHOS 37* 34*  BILITOT 0.5 0.8  BILIDIR 0.2  --   IBILI 0.3  --    PT/INR Recent Labs    04/12/23 1614  LABPROT 15.4*  INR 1.2   Hepatitis Panel No results for input(s): "HEPBSAG", "HCVAB", "HEPAIGM", "HEPBIGM" in the last 72 hours.  No results found.  Patient profile:  78 y.o. female with a past medical history of anxiety, asthma, hypertension, OSA, memory impairment, Parkinson's disease, spinal stenosis, iron deficiency anemia, GERD, UGI bleed secondary to esophagitis and PUD 09/2022, diverticulosis and colon polyps.  Admitted to the hospital 04/12/2023 with melena and associated anemia.  Assessment / Plan:  78 year old female with a history of GERD, hepatitis, upper GI bleed secondary to a large prepyloric/duodenal bulb ulcer 09/2022 admitted to the hospital UGI vs lower GI bleed.  Rectal exam by the ED physician identified gross melena. Patient endorses passing red blood with loose/diarrhea bowel movements 2/15 - 04/12/2023, no black stools. Admission Hg 6.2 -> 5.2 (baseline Hg 10 - 11s) -> transfused 2 units of PRBCs -> Hg 7.3 -> transfused 1 unit of PRBCs 2/18 -> Hg 10 -> today Hg 9.1.  Patient was scheduled for EGD and colonoscopy today but she did not clear out after completing the bowel prep therefore procedures rescheduled tomorrow, will receive additional bowel prep this evening and at 4am. She feels a bit weak  this afternoon, RN reported she passed 15 small volume loose brown stools mixed with dark red blood which started midmorning.  No chest pain, dizziness or shortness of breath. -H&H now -RN to check vital signs now -Clear liquid diet -Repeat bowel prep ordered -NPO after midnight -EGD and colonoscopy tomorrow with Dr. Leonides Schanz, benefits and risks discussed including risk with sedation, risk of bleeding, perforation and infection -Continue PPI IV Bid -IV fluids per the hospitalist -Continue to monitor the patient closely for active GI bleeding   Acute on chronic anemia.  Admission Hg 6.2 -> 5.2 (baseline Hg 10 - 11s). Transfused 2 units of PRBCs -> Hg 7.3 -> transfused 1 unit of PRBCs 2/18 -> Hg 10 -> today Hg 9.1.   -Transfuse for hemoglobin less than 7.5 or as needed if symptomatic -CBC in a.m.   History of colon polyps. Colonoscopy 03/2018 identified for tubular adenomatous polyps removed from the colon.   History of diverticulosis   History of Parkinson's disease   AKI, Cr 1.27 -> 1.24 -> Cr 1.03.      Principal Problem:   Symptomatic anemia Active Problems:   Morbid obesity   Essential hypertension   Asthma   GERD (gastroesophageal reflux disease)   OSA (obstructive sleep apnea)   Low back pain   Parkinson disease (HCC)   S/P lumbar laminectomy   Memory loss   Anxiety   OAB (overactive bladder)   Spinal stenosis of lumbar region without neurogenic claudication   Gastritis   Peptic ulcer disease   GI bleed   Melena   Hematochezia     LOS: 2 days   Arnaldo Natal  04/14/2023, 12:55PM

## 2023-04-14 NOTE — H&P (View-Only) (Signed)
 [facility] Progress Note  CC: Anemia, positive FOBT     Subjective: Patient did not clear after completing 2 doses of movie prep therefore EGD/colonoscopy rescheduled for tomorrow.  She passed 15 small volume loose brown stools mixed with dark red blood as reported by his RN.  H&H being drawn at this time.  Patient endorses feeling a little weak without chest pain or shortness of breath.  She is sitting up in the chair at this time. RN at the bedside.    Objective:  Vital signs in last 24 hours: Temp:  [98.4 F (36.9 C)-99 F (37.2 C)] 98.4 F (36.9 C) (02/19 0000) Pulse Rate:  [91-116] 96 (02/19 0000) Resp:  [15-18] 18 (02/18 1800) BP: (125-194)/(67-100) 125/67 (02/19 0000) SpO2:  [81 %-97 %] 97 % (02/19 0000) Last BM Date : 04/13/23 General: Alert 78 year old female no acute distress. Heart: Regular rate and rhythm, systolic murmur. Pulm: Breath sounds clear throughout.  No crackles, wheezes or rhonchi. Abdomen: Soft, nondistended.  Nontender.  Positive bowel sounds to all 4 quadrants. Extremities: Mild bilateral lower extremity edema. Neurologic:  Alert and  oriented x 4.  Speech is clear.  Moves all extremities equally. Psych:  Alert and cooperative. Normal mood and affect.  Intake/Output from previous day: 02/18 0701 - 02/19 0700 In: 3124.7 [I.V.:2312.7; Blood:412; IV Piggyback:400] Out: -  Intake/Output this shift: Total I/O In: 360 [P.O.:360] Out: -   Lab Results: Recent Labs    04/13/23 1459 04/13/23 2246 04/14/23 0435  WBC 12.5* 10.8* 9.5  HGB 10.0* 9.7* 9.1*  HCT 29.8* 28.9* 27.3*  PLT 207 207 180   BMET Recent Labs    04/12/23 1408 04/13/23 0435 04/14/23 0435  NA 140 142 141  K 4.1 3.4* 3.5  CL 106 112* 105  CO2 29 25 24   GLUCOSE 119* 100* 97  BUN 45* 27* 13  CREATININE 1.27* 1.24* 1.03*  CALCIUM 8.4* 7.9* 8.7*   LFT Recent Labs    04/12/23 1614 04/13/23 0435  PROT 5.9* 4.9*  ALBUMIN 3.3* 2.5*  AST 18 19  ALT 11 13   ALKPHOS 37* 34*  BILITOT 0.5 0.8  BILIDIR 0.2  --   IBILI 0.3  --    PT/INR Recent Labs    04/12/23 1614  LABPROT 15.4*  INR 1.2   Hepatitis Panel No results for input(s): "HEPBSAG", "HCVAB", "HEPAIGM", "HEPBIGM" in the last 72 hours.  No results found.  Patient profile:  78 y.o. female with a past medical history of anxiety, asthma, hypertension, OSA, memory impairment, Parkinson's disease, spinal stenosis, iron deficiency anemia, GERD, UGI bleed secondary to esophagitis and PUD 09/2022, diverticulosis and colon polyps.  Admitted to the hospital 04/12/2023 with melena and associated anemia.  Assessment / Plan:  78 year old female with a history of GERD, hepatitis, upper GI bleed secondary to a large prepyloric/duodenal bulb ulcer 09/2022 admitted to the hospital UGI vs lower GI bleed.  Rectal exam by the ED physician identified gross melena. Patient endorses passing red blood with loose/diarrhea bowel movements 2/15 - 04/12/2023, no black stools. Admission Hg 6.2 -> 5.2 (baseline Hg 10 - 11s) -> transfused 2 units of PRBCs -> Hg 7.3 -> transfused 1 unit of PRBCs 2/18 -> Hg 10 -> today Hg 9.1.  Patient was scheduled for EGD and colonoscopy today but she did not clear out after completing the bowel prep therefore procedures rescheduled tomorrow, will receive additional bowel prep this evening and at 4am. She feels a bit weak  this afternoon, RN reported she passed 15 small volume loose brown stools mixed with dark red blood which started midmorning.  No chest pain, dizziness or shortness of breath. -H&H now -RN to check vital signs now -Clear liquid diet -Repeat bowel prep ordered -NPO after midnight -EGD and colonoscopy tomorrow with Dr. Leonides Schanz, benefits and risks discussed including risk with sedation, risk of bleeding, perforation and infection -Continue PPI IV Bid -IV fluids per the hospitalist -Continue to monitor the patient closely for active GI bleeding   Acute on chronic anemia.  Admission Hg 6.2 -> 5.2 (baseline Hg 10 - 11s). Transfused 2 units of PRBCs -> Hg 7.3 -> transfused 1 unit of PRBCs 2/18 -> Hg 10 -> today Hg 9.1.   -Transfuse for hemoglobin less than 7.5 or as needed if symptomatic -CBC in a.m.   History of colon polyps. Colonoscopy 03/2018 identified for tubular adenomatous polyps removed from the colon.   History of diverticulosis   History of Parkinson's disease   AKI, Cr 1.27 -> 1.24 -> Cr 1.03.      Principal Problem:   Symptomatic anemia Active Problems:   Morbid obesity   Essential hypertension   Asthma   GERD (gastroesophageal reflux disease)   OSA (obstructive sleep apnea)   Low back pain   Parkinson disease (HCC)   S/P lumbar laminectomy   Memory loss   Anxiety   OAB (overactive bladder)   Spinal stenosis of lumbar region without neurogenic claudication   Gastritis   Peptic ulcer disease   GI bleed   Melena   Hematochezia     LOS: 2 days   Arnaldo Natal  04/14/2023, 12:55PM

## 2023-04-15 ENCOUNTER — Encounter (HOSPITAL_COMMUNITY): Payer: Self-pay | Admitting: Internal Medicine

## 2023-04-15 ENCOUNTER — Inpatient Hospital Stay (HOSPITAL_COMMUNITY): Payer: Medicare Other

## 2023-04-15 ENCOUNTER — Encounter (HOSPITAL_COMMUNITY)
Admission: EM | Disposition: A | Discharge status: Home Health | Payer: Self-pay | Source: Home / Self Care | Attending: Internal Medicine

## 2023-04-15 ENCOUNTER — Telehealth: Payer: Self-pay

## 2023-04-15 DIAGNOSIS — K269 Duodenal ulcer, unspecified as acute or chronic, without hemorrhage or perforation: Secondary | ICD-10-CM | POA: Diagnosis not present

## 2023-04-15 DIAGNOSIS — K209 Esophagitis, unspecified without bleeding: Secondary | ICD-10-CM | POA: Diagnosis not present

## 2023-04-15 DIAGNOSIS — K648 Other hemorrhoids: Secondary | ICD-10-CM

## 2023-04-15 DIAGNOSIS — K449 Diaphragmatic hernia without obstruction or gangrene: Secondary | ICD-10-CM

## 2023-04-15 DIAGNOSIS — K5730 Diverticulosis of large intestine without perforation or abscess without bleeding: Secondary | ICD-10-CM

## 2023-04-15 DIAGNOSIS — K920 Hematemesis: Secondary | ICD-10-CM

## 2023-04-15 DIAGNOSIS — K2970 Gastritis, unspecified, without bleeding: Secondary | ICD-10-CM

## 2023-04-15 DIAGNOSIS — K222 Esophageal obstruction: Secondary | ICD-10-CM

## 2023-04-15 DIAGNOSIS — K31819 Angiodysplasia of stomach and duodenum without bleeding: Secondary | ICD-10-CM

## 2023-04-15 DIAGNOSIS — D649 Anemia, unspecified: Secondary | ICD-10-CM | POA: Diagnosis not present

## 2023-04-15 HISTORY — PX: ESOPHAGOGASTRODUODENOSCOPY (EGD) WITH PROPOFOL: SHX5813

## 2023-04-15 HISTORY — PX: COLONOSCOPY WITH PROPOFOL: SHX5780

## 2023-04-15 HISTORY — PX: HOT HEMOSTASIS: SHX5433

## 2023-04-15 LAB — CBC WITH DIFFERENTIAL/PLATELET
Abs Immature Granulocytes: 0.02 10*3/uL (ref 0.00–0.07)
Basophils Absolute: 0 10*3/uL (ref 0.0–0.1)
Basophils Relative: 0 %
Eosinophils Absolute: 0.4 10*3/uL (ref 0.0–0.5)
Eosinophils Relative: 5 %
HCT: 26.9 % — ABNORMAL LOW (ref 36.0–46.0)
Hemoglobin: 9 g/dL — ABNORMAL LOW (ref 12.0–15.0)
Immature Granulocytes: 0 %
Lymphocytes Relative: 24 %
Lymphs Abs: 1.7 10*3/uL (ref 0.7–4.0)
MCH: 30.9 pg (ref 26.0–34.0)
MCHC: 33.5 g/dL (ref 30.0–36.0)
MCV: 92.4 fL (ref 80.0–100.0)
Monocytes Absolute: 1 10*3/uL (ref 0.1–1.0)
Monocytes Relative: 14 %
Neutro Abs: 4.1 10*3/uL (ref 1.7–7.7)
Neutrophils Relative %: 57 %
Platelets: 230 10*3/uL (ref 150–400)
RBC: 2.91 MIL/uL — ABNORMAL LOW (ref 3.87–5.11)
RDW: 16.5 % — ABNORMAL HIGH (ref 11.5–15.5)
WBC: 7.1 10*3/uL (ref 4.0–10.5)
nRBC: 1.1 % — ABNORMAL HIGH (ref 0.0–0.2)

## 2023-04-15 LAB — BRAIN NATRIURETIC PEPTIDE: B Natriuretic Peptide: 76.1 pg/mL (ref 0.0–100.0)

## 2023-04-15 LAB — BASIC METABOLIC PANEL
Anion gap: 10 (ref 5–15)
BUN: 9 mg/dL (ref 8–23)
CO2: 27 mmol/L (ref 22–32)
Calcium: 8.5 mg/dL — ABNORMAL LOW (ref 8.9–10.3)
Chloride: 106 mmol/L (ref 98–111)
Creatinine, Ser: 1.07 mg/dL — ABNORMAL HIGH (ref 0.44–1.00)
GFR, Estimated: 53 mL/min — ABNORMAL LOW (ref >60)
Glucose, Bld: 117 mg/dL — ABNORMAL HIGH (ref 70–99)
Potassium: 3.5 mmol/L (ref 3.5–5.1)
Sodium: 143 mmol/L (ref 135–145)

## 2023-04-15 LAB — PHOSPHORUS: Phosphorus: 3.9 mg/dL (ref 2.5–4.6)

## 2023-04-15 LAB — MAGNESIUM: Magnesium: 1.8 mg/dL (ref 1.7–2.4)

## 2023-04-15 SURGERY — ESOPHAGOGASTRODUODENOSCOPY (EGD) WITH PROPOFOL
Anesthesia: Monitor Anesthesia Care

## 2023-04-15 MED ORDER — PROPOFOL 10 MG/ML IV BOLUS
INTRAVENOUS | Status: DC | PRN
  Administered 2023-04-15 (×2): 20 mg via INTRAVENOUS
  Administered 2023-04-15: 30 mg via INTRAVENOUS
  Administered 2023-04-15 (×3): 20 mg via INTRAVENOUS
  Administered 2023-04-15: 30 mg via INTRAVENOUS
  Administered 2023-04-15 (×3): 20 mg via INTRAVENOUS
  Administered 2023-04-15: 40 mg via INTRAVENOUS

## 2023-04-15 MED ORDER — PROPOFOL 500 MG/50ML IV EMUL
INTRAVENOUS | Status: DC | PRN
  Administered 2023-04-15: 100 ug/kg/min via INTRAVENOUS

## 2023-04-15 MED ORDER — SODIUM CHLORIDE 0.9 % IV SOLN: INTRAVENOUS | Status: DC | PRN

## 2023-04-15 MED ORDER — PHENYLEPHRINE HCL (PRESSORS) 10 MG/ML IV SOLN
INTRAVENOUS | Status: DC | PRN
  Administered 2023-04-15 (×2): 160 ug via INTRAVENOUS
  Administered 2023-04-15 (×2): 80 ug via INTRAVENOUS

## 2023-04-15 MED ORDER — ALBUMIN HUMAN 5 % IV SOLN
INTRAVENOUS | Status: DC | PRN
Start: 2023-04-15 — End: 2023-04-15

## 2023-04-15 MED ORDER — GLYCOPYRROLATE PF 0.2 MG/ML IJ SOSY
PREFILLED_SYRINGE | INTRAMUSCULAR | Status: DC | PRN
  Administered 2023-04-15: .1 mg via INTRAVENOUS

## 2023-04-15 SURGICAL SUPPLY — 23 items
BLOCK BITE 60FR ADLT L/F BLUE (MISCELLANEOUS) ×1 IMPLANT
ELECT REM PT RETURN 9FT ADLT (ELECTROSURGICAL) IMPLANT
ELECTRODE REM PT RTRN 9FT ADLT (ELECTROSURGICAL) IMPLANT
FLOOR PAD 36X40 (MISCELLANEOUS) ×1 IMPLANT
FORCEP RJ3 GP 1.8X160 W-NEEDLE (CUTTING FORCEPS) IMPLANT
FORCEPS BIOP RAD 4 LRG CAP 4 (CUTTING FORCEPS) IMPLANT
FORCEPS BIOP RJ4 240 W/NDL (CUTTING FORCEPS) IMPLANT
FORCEPS BXJMBJMB 240X2.8X (CUTTING FORCEPS) IMPLANT
INJECTOR/SNARE I SNARE (MISCELLANEOUS) IMPLANT
LUBRICANT JELLY 4.5OZ STERILE (MISCELLANEOUS) IMPLANT
MANIFOLD NEPTUNE II (INSTRUMENTS) IMPLANT
NDL SCLEROTHERAPY 25GX240 (NEEDLE) IMPLANT
NEEDLE SCLEROTHERAPY 25GX240 (NEEDLE) IMPLANT
PAD FLOOR 36X40 (MISCELLANEOUS) ×1 IMPLANT
PROBE APC STR FIRE (PROBE) IMPLANT
PROBE INJECTION GOLD 7FR (MISCELLANEOUS) IMPLANT
SNARE ROTATE MED OVAL 20MM (MISCELLANEOUS) IMPLANT
SNARE SHORT THROW 13M SML OVAL (MISCELLANEOUS) IMPLANT
SYR 50ML LL SCALE MARK (SYRINGE) IMPLANT
TRAP SPECIMEN MUCOUS 40CC (MISCELLANEOUS) IMPLANT
TUBING ENDO SMARTCAP PENTAX (MISCELLANEOUS) ×2 IMPLANT
TUBING IRRIGATION ENDOGATOR (MISCELLANEOUS) ×1 IMPLANT
WATER STERILE IRR 1000ML POUR (IV SOLUTION) IMPLANT

## 2023-04-15 NOTE — Plan of Care (Signed)

## 2023-04-15 NOTE — Progress Notes (Signed)
 EOS  Pt forgetful & confused at times overnight.   Constantly asking what she needs to do and if she missed anything. Frequently calling on call bell to state that she didn't need anything.   Pt dumped bedpan full of urine and stool into sink.   Severe parkinsons tremors

## 2023-04-15 NOTE — Telephone Encounter (Signed)
-----   Message from Imogene Burn sent at 04/15/2023  1:59 PM EST ----- Geannie Risen, please arrange for GI clinic follow-up in 1 to 2 months with Dr. Marina Goodell or APP for follow-up of a recent admission for GI bleed.

## 2023-04-15 NOTE — Progress Notes (Signed)
 PROGRESS NOTE                                                                                                                                                                                                             Patient Demographics:    Mallory Klein, is a 78 y.o. female, DOB - 1946-01-26, VHQ:[id number]  Outpatient Primary MD for the patient is Plotnikov, Georgina Quint, MD    LOS - 3  Admit date - 04/12/2023    Chief Complaint  Patient presents with   Rectal Bleeding       Brief Narrative (HPI from H&P)    78 y.o. female with medical history significant of hypertension, GERD, gastritis, PUD, anemia, asthma, anxiety, memory loss, Parkinson's disease, OSA, OAB, obesity, low back pain, spinal stenosis, status post laminectomy presenting with melena and possible vaginal bleeding.   Patient reports she has had 2 days of melanotic stools.  She also suspicious for possible vaginal bleeding.  Denies any recent NSAID use.  Does report abdominal pain.  Has had admission for GI bleed in August of last year secondary to PUD and esophagitis.  Did not require transfusion at that time.     Subjective:    Jacqulene Huntley today reports it was a difficult night given her multiple BMs due to bowel prep, she drank bowel prep overnight, still had some dark-colored stool, discussed with staff, no evidence of bright blood.     Assessment  & Plan :    Symptomatic acute blood loss anemia anemia Upper GI bleed, presents with melena and hematochezia H/O PUD/Gastritis/Esophagitis  -Significant drop from baseline hemoglobin of 11.4, two 5.4 on admission. -GI input greatly appreciated, for endoscopy, colonoscopy, has been postponed till today as she did not clear her bowels yesterday .  Was kept on clear liquid diet yesterday, and received another bowel prep overnight. -Transfused 3 units PRBC, hemoglobin has stabilized, continue to monitor  closely and transfuse as needed, stable this morning at 9.1. -Continue with IV PPI  Hypertension   -continue to hold hydrochlorothiazide and keep on as needed hydralazine . -Blood pressure much improved after starting Norvasc and hydralazine.     Hyperlipidemia  - Continue home rosuvastatin   Anxiety  - Continue home Seroquel and hydroxyzine   Parkinson disease  - Continue on pramipexole   OSA  -  No longer on CPAP   OAB - Replace home Vesicare with formulary Toviaz   Obesity - follow with PCP for weight loss.   Low back pain - Spinal stenosis - History of laminectomy  - Noted, supportive care        Condition -   Guarded  Family Communication  : Discussed with patient, none at bedside Code Status :  Full  Consults  :  GI  PUD Prophylaxis : PPI   Procedures  :            Disposition Plan  :    Status is: Inpatient   DVT Prophylaxis  :    Place and maintain sequential compression device Start: 04/14/23 1536 SCDs Start: 04/12/23 1745    Lab Results  Component Value Date   PLT 230 04/15/2023    Diet :  Diet Order             Diet NPO time specified  Diet effective midnight                    Inpatient Medications  Scheduled Meds:  [MAR Hold] amLODipine  10 mg Oral Daily   [MAR Hold] feeding supplement  1 Container Oral TID BM   [MAR Hold] fesoterodine  4 mg Oral Daily   [MAR Hold] hydrALAZINE  50 mg Oral Q8H   [MAR Hold] pantoprazole (PROTONIX) IV  40 mg Intravenous Q6H   Followed by   [MAR Hold] pantoprazole (PROTONIX) IV  40 mg Intravenous Q12H   [MAR Hold] pramipexole  0.75 mg Oral BID   [MAR Hold] QUEtiapine  100 mg Oral QHS   [MAR Hold] rosuvastatin  10 mg Oral Daily   [MAR Hold] sodium chloride flush  3 mL Intravenous Q12H   Continuous Infusions:   PRN Meds:.[MAR Hold] acetaminophen **OR** [MAR Hold] acetaminophen, [MAR Hold] hydrOXYzine, [MAR Hold] ondansetron (ZOFRAN) IV  Antibiotics  :    Anti-infectives (From admission,  onward)    None         Objective:   Vitals:   04/15/23 0012 04/15/23 0325 04/15/23 0737 04/15/23 1100  BP: 103/62 110/71 (!) 149/77   Pulse:  81 89 90  Resp:  15 17 16   Temp: 98.4 F (36.9 C) 98.3 F (36.8 C) 98.4 F (36.9 C) 98.7 F (37.1 C)  TempSrc: Oral Oral Oral Temporal  SpO2:  94% 95%   Height:        Wt Readings from Last 3 Encounters:  12/25/22 57.6 kg  11/26/22 57.6 kg  11/18/22 57.9 kg     Intake/Output Summary (Last 24 hours) at 04/15/2023 1149 Last data filed at 04/14/2023 1600 Gross per 24 hour  Intake 240 ml  Output --  Net 240 ml     Physical Exam  Awake Alert, Oriented X 3, No new F.N deficits, Normal affect Symmetrical Chest wall movement, Good air movement bilaterally, CTAB RRR,No Gallops,Rubs or new Murmurs, No Parasternal Heave +ve B.Sounds, Abd Soft, No tenderness, No rebound - guarding or rigidity. No Cyanosis, Clubbing or edema, No new Rash or bruise           Data Review:    Recent Labs  Lab 04/13/23 1459 04/13/23 2246 04/14/23 0435 04/14/23 1234 04/14/23 1838 04/15/23 0432  WBC 12.5* 10.8* 9.5  --  9.9 7.1  HGB 10.0* 9.7* 9.1* 10.6* 10.2* 9.0*  HCT 29.8* 28.9* 27.3* 32.2* 30.9* 26.9*  PLT 207 207 180  --  244 230  MCV  90.0 90.3 90.7  --  91.2 92.4  MCH 29.9 30.3 30.2  --  30.1 30.9  MCHC 33.2 33.6 33.3  --  33.0 33.5  RDW 16.2* 16.1* 16.3*  --  16.4* 16.5*  LYMPHSABS  --   --  2.9  --   --  1.7  MONOABS  --   --  1.1*  --   --  1.0  EOSABS  --   --  0.7*  --   --  0.4  BASOSABS  --   --  0.0  --   --  0.0    Recent Labs  Lab 04/12/23 1408 04/12/23 1614 04/13/23 0435 04/14/23 0435 04/15/23 0432  NA 140  --  142 141 143  K 4.1  --  3.4* 3.5 3.5  CL 106  --  112* 105 106  CO2 29  --  25 24 27   ANIONGAP 5  --  5 12 10   GLUCOSE 119*  --  100* 97 117*  BUN 45*  --  27* 13 9  CREATININE 1.27*  --  1.24* 1.03* 1.07*  AST  --  18 19  --   --   ALT  --  11 13  --   --   ALKPHOS  --  37* 34*  --   --    BILITOT  --  0.5 0.8  --   --   ALBUMIN  --  3.3* 2.5*  --   --   INR  --  1.2  --   --   --   BNP  --   --   --  201.7* 76.1  MG  --   --   --  1.8 1.8  PHOS  --   --   --  3.3 3.9  CALCIUM 8.4*  --  7.9* 8.7* 8.5*      Recent Labs  Lab 04/12/23 1408 04/12/23 1614 04/13/23 0435 04/14/23 0435 04/15/23 0432  INR  --  1.2  --   --   --   BNP  --   --   --  201.7* 76.1  MG  --   --   --  1.8 1.8  CALCIUM 8.4*  --  7.9* 8.7* 8.5*     Micro Results No results found for this or any previous visit (from the past 240 hours).  Radiology Report No results found.   Signature  -   Huey Bienenstock M.D on 04/15/2023 at 11:49 AM   -  To page go to [email or web address]

## 2023-04-15 NOTE — Telephone Encounter (Signed)
 Pt scheduled to see Dr. Marina Goodell 06/14/23 at 9am. Appt letter mailed to pt.

## 2023-04-15 NOTE — Progress Notes (Signed)
 Physical Therapy Treatment Patient Details Name: Mallory Klein MRN: [id number] DOB: 10-Nov-1945 Today's Date: 04/15/2023   History of Present Illness Pt is a 78 y.o. female admitted 2/17 with GI bleed and symptomatic anemia. PMH: HTN, GERD, gastritis, PUD, anemia, asthma, anxiety, memory loss, Parkinson's disease, OSA, OAB, obesity, low back pain, spinal stenosis, s/p laminectomy    PT Comments  Received pt semi-reclined in bed and agreeable to PT treatment. Pt performed bed mobility with HOB elevated and use of bedrails with supervision and increased time/effort. Stood from EOB with RW and CGA and pt incontinent of loose bowel - transferred to/from bedside commode with RW and CGA (pt with continuous bowel incontinence while standing). Required light min A to stand from bedside commode and dependent for hygiene management. Ambulation limited by incontinence, although pt remains motivated to get stronger. Current discharge recommendations remain appropriate. Acute PT to cont to follow.    If plan is discharge home, recommend the following: A little help with walking and/or transfers;A little help with bathing/dressing/bathroom;Assistance with cooking/housework;Assist for transportation;Help with stairs or ramp for entrance   Can travel by private vehicle        Equipment Recommendations  Wheelchair (measurements PT);Wheelchair cushion (measurements PT)    Recommendations for Other Services       Precautions / Restrictions Precautions Precautions: Fall;Other (comment) Precaution/Restrictions Comments: watch HR Restrictions Weight Bearing Restrictions Per Provider Order: No     Mobility  Bed Mobility Overal bed mobility: Needs Assistance Bed Mobility: Rolling, Supine to Sit Rolling: Supervision   Supine to sit: Supervision, HOB elevated, Used rails     General bed mobility comments: increased time/effort but no physical assist required Patient Response:  Cooperative  Transfers Overall transfer level: Needs assistance Equipment used: Rolling walker (2 wheels) Transfers: Sit to/from Stand, Bed to chair/wheelchair/BSC Sit to Stand: Contact guard assist Stand pivot transfers: Contact guard assist         General transfer comment: Transferred bed<>bedside commode with RW and CGA, then bedside commode<>recliner with RW and CGA (required light min A to stand from bedside commode). Pt limited by bowel incontinence that continued in standing when transferring on/off bedside commode. RN aware    Ambulation/Gait               General Gait Details: unable to ambulate due to continuous bowel incontinence in standing   Stairs             Wheelchair Mobility     Tilt Bed Tilt Bed Patient Response: Cooperative  Modified Rankin (Stroke Patients Only)       Balance Overall balance assessment: Needs assistance Sitting-balance support: No upper extremity supported, Feet supported Sitting balance-Leahy Scale: Fair Sitting balance - Comments: able to maintain static sitting balance with supervision   Standing balance support: Bilateral upper extremity supported, During functional activity, Reliant on assistive device for balance (RW) Standing balance-Leahy Scale: Fair Standing balance comment: able to maintain static standing balance with close supervision; required CGA for dynamic standing balance                            Communication Communication Communication: No apparent difficulties  Cognition Arousal: Alert Behavior During Therapy: WFL for tasks assessed/performed   PT - Cognitive impairments: No apparent impairments                       PT - Cognition Comments: pleasant and cooperative Following commands:  Intact      Cueing    Exercises      General Comments General comments (skin integrity, edema, etc.): VSS on RA      Pertinent Vitals/Pain Pain Assessment Pain Assessment:  No/denies pain    Home Living                          Prior Function            PT Goals (current goals can now be found in the care plan section) Acute Rehab PT Goals Patient Stated Goal: home PT Goal Formulation: With patient Time For Goal Achievement: 04/27/23 Potential to Achieve Goals: Good    Frequency    Min 1X/week      PT Plan      Co-evaluation              AM-PAC PT "6 Clicks" Mobility   Outcome Measure  Help needed turning from your back to your side while in a flat bed without using bedrails?: A Little Help needed moving from lying on your back to sitting on the side of a flat bed without using bedrails?: A Little Help needed moving to and from a bed to a chair (including a wheelchair)?: A Little Help needed standing up from a chair using your arms (e.g., wheelchair or bedside chair)?: A Little Help needed to walk in hospital room?: A Little Help needed climbing 3-5 steps with a railing? : A Lot 6 Click Score: 17    End of Session Equipment Utilized During Treatment: Gait belt Activity Tolerance: Patient tolerated treatment well;Other (comment) (limited by bowel incontinence) Patient left: with call bell/phone within reach;in chair;with chair alarm set Nurse Communication: Mobility status PT Visit Diagnosis: Muscle weakness (generalized) (M62.81);Difficulty in walking, not elsewhere classified (R26.2)     Time: 1610-9604 PT Time Calculation (min) (ACUTE ONLY): 21 min  Charges:    $Therapeutic Activity: 8-22 mins PT General Charges $$ ACUTE PT VISIT: 1 Visit                     Blima Rich PT, DPT Marlana Salvage Zaunegger 04/15/2023, 8:13 AM

## 2023-04-15 NOTE — Anesthesia Postprocedure Evaluation (Signed)
 Anesthesia Post Note  Patient: Mallory Klein  Procedure(s) Performed: ESOPHAGOGASTRODUODENOSCOPY (EGD) WITH PROPOFOL COLONOSCOPY WITH PROPOFOL HOT HEMOSTASIS (ARGON PLASMA COAGULATION/BICAP)     Patient location during evaluation: PACU Anesthesia Type: MAC Level of consciousness: awake and alert Pain management: pain level controlled Vital Signs Assessment: post-procedure vital signs reviewed and stable Respiratory status: spontaneous breathing, nonlabored ventilation and respiratory function stable Cardiovascular status: stable and blood pressure returned to baseline Postop Assessment: no apparent nausea or vomiting Anesthetic complications: no   No notable events documented.  Last Vitals:  Vitals:   04/15/23 1340 04/15/23 1350  BP: (!) 124/99 120/80  Pulse: 94 95  Resp: 12 15  Temp:    SpO2: 100% 99%    Last Pain:  Vitals:   04/15/23 1350  TempSrc:   PainSc: 0-No pain                 Collene Schlichter

## 2023-04-15 NOTE — Progress Notes (Addendum)
 Bedside shift report attempted. When door opened, dog ran up to RN's barking. Dog then ran to patient, barking, aggressive, protective of patient. Unable to approach patient due to aggressive dog.   Edit to add: dog was not labeled in any way as a service animal. Dog was wearing colorful pajamas

## 2023-04-15 NOTE — Progress Notes (Signed)
 Pt attempting to reach husband via room phone. This RN helped pt dial phone. Pt talking to husband over room phone.  Room phone #: [phone]  Husband's number: [phone]

## 2023-04-15 NOTE — Op Note (Signed)
 [facility] Patient Name: Mallory Klein Procedure Date : 04/15/2023 MRN: [id number] Attending MD: Particia Lather , , [id number] Date of Birth: 07-20-1945 CSN: [id number] Age: 78 Admit Type: Inpatient Procedure:                Upper GI endoscopy Indications:              Hematochezia, Melena Providers:                Madelyn Brunner" Golden Hurter, RN, Kandice Robinsons, Technician Referring MD:             Hospitalist team Medicines:                Monitored Anesthesia Care Complications:            No immediate complications. Estimated Blood Loss:     Estimated blood loss was minimal. Procedure:                Pre-Anesthesia Assessment:                           - Prior to the procedure, a History and Physical                            was performed, and patient medications and                            allergies were reviewed. The patient's tolerance of                            previous anesthesia was also reviewed. The risks                            and benefits of the procedure and the sedation                            options and risks were discussed with the patient.                            All questions were answered, and informed consent                            was obtained. Prior Anticoagulants: The patient has                            taken no anticoagulant or antiplatelet agents. ASA                            Grade Assessment: III - A patient with severe                            systemic disease. After reviewing the risks and  benefits, the patient was deemed in satisfactory                            condition to undergo the procedure.                           After obtaining informed consent, the endoscope was                            passed under direct vision. Throughout the                            procedure, the patient's blood pressure, pulse, and                             oxygen saturations were monitored continuously. The                            [id number]-H190 ([id number] Olympus endoscope was introduced                            through the mouth, and advanced to the second part                            of duodenum. The upper GI endoscopy was                            accomplished without difficulty. The patient                            tolerated the procedure well. Scope In: Scope Out: Findings:      LA Grade A (one or more mucosal breaks less than 5 mm, not extending       between tops of 2 mucosal folds) esophagitis with no bleeding was found       in the distal esophagus.      One benign-appearing, intrinsic moderate (circumferential scarring or       stenosis; an endoscope may pass) stenosis was found at the       gastroesophageal junction. This stenosis measured less than one cm (in       length). The stenosis was traversed.      A hiatal hernia was present.      Localized inflammation characterized by congestion (edema), erosions and       erythema was found in the gastric antrum. Biopsies were taken with a       cold forceps for histology.      Two non-bleeding cratered duodenal ulcers with a clean ulcer base       (Forrest Class III) were found in the duodenal bulb. The largest lesion       was 6 mm in largest dimension.      Two small angioectasias without bleeding were found in the second       portion of the duodenum. Coagulation for bleeding prevention using argon       plasma at 1 liter/minute and 20 watts was successful. Impression:               -  LA Grade A esophagitis with no bleeding.                           - Benign-appearing esophageal stenosis.                           - Hiatal hernia.                           - Gastritis. Biopsied.                           - Non-bleeding duodenal ulcers with a clean ulcer                            base (Forrest Class III).                           - Two non-bleeding angioectasias  in the duodenum.                            Treated with argon plasma coagulation (APC). Recommendation:           - PPI BID for 12 weeks, then QD                           - Avoid NSAIDs                           - Perform a colonoscopy today. Procedure Code(s):        --- Professional ---                           43255, 59, Esophagogastroduodenoscopy, flexible,                            transoral; with control of bleeding, any method                           43239, Esophagogastroduodenoscopy, flexible,                            transoral; with biopsy, single or multiple Diagnosis Code(s):        --- Professional ---                           K20.90, Esophagitis, unspecified without bleeding                           K22.2, Esophageal obstruction                           K44.9, Diaphragmatic hernia without obstruction or                            gangrene  K29.70, Gastritis, unspecified, without bleeding                           K26.9, Duodenal ulcer, unspecified as acute or                            chronic, without hemorrhage or perforation                           K31.819, Angiodysplasia of stomach and duodenum                            without bleeding                           K92.1, Melena (includes Hematochezia) CPT copyright 2022 [employer]. All rights reserved. The codes documented in this report are preliminary and upon coder review may  be revised to meet current compliance requirements. Dr Particia Lather "[name]" [name],  04/15/2023 1:32:57 PM Number of Addenda: 0

## 2023-04-15 NOTE — Interval H&P Note (Signed)
 History and Physical Interval Note:  04/15/2023 11:19 AM  Mallory Klein  has presented today for surgery, with the diagnosis of Melena, anemia.  The various methods of treatment have been discussed with the patient and family. After consideration of risks, benefits and other options for treatment, the patient has consented to  Procedure(s): ESOPHAGOGASTRODUODENOSCOPY (EGD) WITH PROPOFOL (N/A) COLONOSCOPY WITH PROPOFOL (N/A) as a surgical intervention.  The patient's history has been reviewed, patient examined, no change in status, stable for surgery.  I have reviewed the patient's chart and labs.  Questions were answered to the patient's satisfaction.     Imogene Burn

## 2023-04-15 NOTE — Plan of Care (Signed)

## 2023-04-15 NOTE — Op Note (Addendum)
 [facility] Patient Name: Mallory Klein Procedure Date : 04/15/2023 MRN: [id number] Attending MD: Particia Lather , , [id number] Date of Birth: 1945-06-24 CSN: [id number] Age: 78 Admit Type: Inpatient Procedure:                Colonoscopy Indications:              Hematochezia Providers:                Madelyn Brunner" Golden Hurter, RN, Kandice Robinsons, Technician Referring MD:             Hospitalist team Medicines:                Monitored Anesthesia Care Complications:            No immediate complications. Estimated Blood Loss:     Estimated blood loss was minimal. Procedure:                Pre-Anesthesia Assessment:                           - Prior to the procedure, a History and Physical                            was performed, and patient medications and                            allergies were reviewed. The patient's tolerance of                            previous anesthesia was also reviewed. The risks                            and benefits of the procedure and the sedation                            options and risks were discussed with the patient.                            All questions were answered, and informed consent                            was obtained. Prior Anticoagulants: The patient has                            taken no anticoagulant or antiplatelet agents. ASA                            Grade Assessment: III - A patient with severe                            systemic disease. After reviewing the risks and  benefits, the patient was deemed in satisfactory                            condition to undergo the procedure.                           After obtaining informed consent, the colonoscope                            was passed under direct vision. Throughout the                            procedure, the patient's blood pressure, pulse, and                             oxygen saturations were monitored continuously. The                            CF-HQ190L ([id number] Olympus coloscope was                            introduced through the anus and advanced to the the                            terminal ileum. The colonoscopy was performed                            without difficulty. The patient tolerated the                            procedure well. The quality of the bowel                            preparation was inadequate. The terminal ileum,                            ileocecal valve, appendiceal orifice, and rectum                            were photographed. Scope In: 12:47:36 PM Scope Out: 1:19:34 PM Scope Withdrawal Time: 0 hours 20 minutes 40 seconds  Total Procedure Duration: 0 hours 31 minutes 58 seconds  Findings:      The terminal ileum appeared normal.      Hematin (altered blood/coffee-ground-like material) was found in the       entire colon. This was cleared without reaccumulation of blood.      Multiple diverticula were found in the left colon.      Non-bleeding internal hemorrhoids were found during retroflexion. Impression:               - Preparation of the colon was inadequate.                           - The examined portion of the ileum was normal.                           -  Blood in the entire examined colon.                           - Diverticulosis in the left colon.                           - Non-bleeding internal hemorrhoids.                           - No specimens collected. Recommendation:           - Return patient to hospital ward for ongoing care.                           - It is suspected that the patient had a                            diverticular bleed that has now resolved. Another                            possibility is that she had an upper GI bleed due                            to duodenal ulcers, duodenal angioectasias, and/or                            esophagitis that has now resolved.                            - The findings and recommendations were discussed                            with the patient. Procedure Code(s):        --- Professional ---                           [phone], Colonoscopy, flexible; diagnostic, including                            collection of specimen(s) by brushing or washing,                            when performed (separate procedure) Diagnosis Code(s):        --- Professional ---                           X91.4, Other hemorrhoids                           K92.2, Gastrointestinal hemorrhage, unspecified                           K92.1, Melena (includes Hematochezia)                           K57.30, Diverticulosis of large intestine without  perforation or abscess without bleeding CPT copyright 2022 [employer]. All rights reserved. The codes documented in this report are preliminary and upon coder review may  be revised to meet current compliance requirements. Dr Particia Lather "[name]" McDowell,  04/15/2023 1:38:45 PM Number of Addenda: 0

## 2023-04-15 NOTE — Transfer of Care (Signed)
 Immediate Anesthesia Transfer of Care Note  Patient: Mallory Klein  Procedure(s) Performed: ESOPHAGOGASTRODUODENOSCOPY (EGD) WITH PROPOFOL COLONOSCOPY WITH PROPOFOL HOT HEMOSTASIS (ARGON PLASMA COAGULATION/BICAP)  Patient Location: PACU  Anesthesia Type:MAC  Level of Consciousness: awake, alert , and oriented  Airway & Oxygen Therapy: Patient Spontanous Breathing and Patient connected to face mask oxygen  Post-op Assessment: Report given to RN and Post -op Vital signs reviewed and stable  Post vital signs: Reviewed and stable  Last Vitals:  Vitals Value Taken Time  BP 104/68 04/15/23 1326  Temp    Pulse 91 04/15/23 1327  Resp 13 04/15/23 1327  SpO2 99 % 04/15/23 1327  Vitals shown include unfiled device data.  Last Pain:  Vitals:   04/15/23 1100  TempSrc: Temporal  PainSc: 0-No pain         Complications: No notable events documented.

## 2023-04-16 ENCOUNTER — Other Ambulatory Visit (HOSPITAL_COMMUNITY): Payer: Self-pay

## 2023-04-16 DIAGNOSIS — D649 Anemia, unspecified: Secondary | ICD-10-CM | POA: Diagnosis not present

## 2023-04-16 LAB — CBC WITH DIFFERENTIAL/PLATELET
Abs Immature Granulocytes: 0.02 10*3/uL (ref 0.00–0.07)
Basophils Absolute: 0 10*3/uL (ref 0.0–0.1)
Basophils Relative: 0 %
Eosinophils Absolute: 0.8 10*3/uL — ABNORMAL HIGH (ref 0.0–0.5)
Eosinophils Relative: 10 %
HCT: 26.7 % — ABNORMAL LOW (ref 36.0–46.0)
Hemoglobin: 8.9 g/dL — ABNORMAL LOW (ref 12.0–15.0)
Immature Granulocytes: 0 %
Lymphocytes Relative: 25 %
Lymphs Abs: 2 10*3/uL (ref 0.7–4.0)
MCH: 31.1 pg (ref 26.0–34.0)
MCHC: 33.3 g/dL (ref 30.0–36.0)
MCV: 93.4 fL (ref 80.0–100.0)
Monocytes Absolute: 0.9 10*3/uL (ref 0.1–1.0)
Monocytes Relative: 12 %
Neutro Abs: 4.4 10*3/uL (ref 1.7–7.7)
Neutrophils Relative %: 53 %
Platelets: 233 10*3/uL (ref 150–400)
RBC: 2.86 MIL/uL — ABNORMAL LOW (ref 3.87–5.11)
RDW: 16.6 % — ABNORMAL HIGH (ref 11.5–15.5)
WBC: 8.1 10*3/uL (ref 4.0–10.5)
nRBC: 0.9 % — ABNORMAL HIGH (ref 0.0–0.2)

## 2023-04-16 LAB — BRAIN NATRIURETIC PEPTIDE: B Natriuretic Peptide: 118.1 pg/mL — ABNORMAL HIGH (ref 0.0–100.0)

## 2023-04-16 LAB — BASIC METABOLIC PANEL
Anion gap: 10 (ref 5–15)
BUN: 6 mg/dL — ABNORMAL LOW (ref 8–23)
CO2: 28 mmol/L (ref 22–32)
Calcium: 8.8 mg/dL — ABNORMAL LOW (ref 8.9–10.3)
Chloride: 105 mmol/L (ref 98–111)
Creatinine, Ser: 1.15 mg/dL — ABNORMAL HIGH (ref 0.44–1.00)
GFR, Estimated: 49 mL/min — ABNORMAL LOW (ref >60)
Glucose, Bld: 98 mg/dL (ref 70–99)
Potassium: 3.3 mmol/L — ABNORMAL LOW (ref 3.5–5.1)
Sodium: 143 mmol/L (ref 135–145)

## 2023-04-16 LAB — MAGNESIUM: Magnesium: 1.7 mg/dL (ref 1.7–2.4)

## 2023-04-16 LAB — PHOSPHORUS: Phosphorus: 3.3 mg/dL (ref 2.5–4.6)

## 2023-04-16 MED ORDER — POTASSIUM CHLORIDE CRYS ER 20 MEQ PO TBCR
40.0000 meq | EXTENDED_RELEASE_TABLET | Freq: Once | ORAL | Status: AC
  Administered 2023-04-16: 40 meq via ORAL
  Filled 2023-04-16: qty 2

## 2023-04-16 MED ORDER — PANTOPRAZOLE SODIUM 40 MG PO TBEC
40.0000 mg | DELAYED_RELEASE_TABLET | Freq: Two times a day (BID) | ORAL | 0 refills | Status: DC
  Filled 2023-04-16: qty 180, 90d supply, fill #0

## 2023-04-16 MED ORDER — POTASSIUM CHLORIDE CRYS ER 20 MEQ PO TBCR
20.0000 meq | EXTENDED_RELEASE_TABLET | Freq: Every day | ORAL | 0 refills | Status: DC
  Filled 2023-04-16: qty 3, 3d supply, fill #0

## 2023-04-16 NOTE — TOC Transition Note (Signed)
 Transition of Care [facility]) - Discharge Note   Patient Details  Name: Mallory Klein MRN: [id number] Date of Birth: 1946-02-14  Transition of Care [facility]) CM/SW Contact:  Gordy Clement, RN Phone Number: 04/16/2023, 11:09 AM   Clinical Narrative:     Patient will DC to home today. Home Health PT will be provided by [facility]     A wheelchair has been ordered and will be provided by [employer]. AVS updated Family to transport     Final next level of care: Home w Home Health Services Barriers to Discharge: Continued Medical Work up   Patient Goals and CMS Choice            Discharge Placement                       Discharge Plan and Services Additional resources added to the After Visit Summary for     Discharge Planning Services: CM Consult                                 Social Drivers of Health (SDOH) Interventions SDOH Screenings   Food Insecurity: No Food Insecurity (04/12/2023)  Housing: Low Risk  (04/12/2023)  Transportation Needs: No Transportation Needs (04/12/2023)  Utilities: Not At Risk (04/12/2023)  Alcohol Screen: Low Risk  (06/24/2022)  Depression (PHQ2-9): Low Risk  (11/16/2022)  Recent Concern: Depression (PHQ2-9) - Medium Risk (10/29/2022)  Financial Resource Strain: Low Risk  (06/24/2022)  Physical Activity: Inactive (06/24/2022)  Social Connections: Socially Integrated (04/12/2023)  Stress: No Stress Concern Present (06/24/2022)  Tobacco Use: Low Risk  (04/15/2023)     Readmission Risk Interventions    10/19/2022   11:03 AM  Readmission Risk Prevention Plan  Post Dischage Appt Complete  Medication Screening Complete  Transportation Screening Complete

## 2023-04-16 NOTE — Discharge Summary (Signed)
 Physician Discharge Summary  YUMALAY CIRCLE ZOX:[id number] DOB: 11-20-1945 DOA: 04/12/2023  PCP: Tresa Garter, MD  Admit date: 04/12/2023 Discharge date: 04/16/2023  Admitted From: (Home) Disposition:  (Home   Recommendations for Outpatient Follow-up:  Follow up with PCP in 1-2 weeks Please obtain BMP/CBC in one week  Diet recommendation: Heart Healthy   Brief/Interim Summary:  78 y.o. female with medical history significant of hypertension, GERD, gastritis, PUD, anemia, asthma, anxiety, memory loss, Parkinson's disease, OSA, OAB, obesity, low back pain, spinal stenosis, status post laminectomy presenting with melena and possible vaginal bleeding.   Patient reports she has had 2 days of melanotic stools.  She also suspicious for possible vaginal bleeding.  Denies any recent NSAID use.  Does report abdominal pain.  Has had admission for GI bleed in August of last year secondary to PUD and esophagitis.  Did not require transfusion at that time.   Symptomatic acute blood loss anemia anemia GI bleed, presents with both melena and hematochezia Acute GI bleed(both upper and lower) upper secondary to gastritis, esophagitis, duodenal angiectasia, and lower GI bleed secondary to diverticular bleed -Significant drop from baseline hemoglobin of 11.4, to 5.4 on admission. -GI input greatly appreciated, recommendation for endoscopy, colonoscopy, but this has been postponed as she was unable to clear her diet on day 1, so she required another bowel prep.  -her EGD/colonoscopy from 2/20. Colonoscopy showed dark red blood throughout the colon that was cleared off by GI physician,. There were no signs of reaccumulation. This suggest to me that she likely had a diverticular bleed that has now resolved. The ileum had no signs of blood. EGD did show grade a esophagitis, esophageal stricture, gastritis, 2 duodenal angiectasia's where treated with APC, and 2 duodenal ulcers.  -Rec PPI twice daily for  12 weeks.  Discussed with patient to avoid using NSAIDs. -Transfused 3 units PRBC, hemoglobin has stabilized   Hypertension   -Resume home regimen   Hyperlipidemia  - Continue home rosuvastatin   Anxiety  - Continue home Seroquel and hydroxyzine   Parkinson disease  - Continue on pramipexole   OSA  - No longer on CPAP   OAB - Replace home Vesicare with formulary Toviaz   Low back pain - Spinal stenosis - History of laminectomy  - Noted, supportive care      Discharge Diagnoses:  Principal Problem:   Symptomatic anemia Active Problems:   Morbid obesity   Essential hypertension   Asthma   GERD (gastroesophageal reflux disease)   OSA (obstructive sleep apnea)   Low back pain   Parkinson disease (HCC)   S/P lumbar laminectomy   Memory loss   Anxiety   OAB (overactive bladder)   Spinal stenosis of lumbar region without neurogenic claudication   Gastritis   Peptic ulcer disease   GI bleed   Melena   Hematochezia    Discharge Instructions  Discharge Instructions     Diet - low sodium heart healthy   Complete by: As directed    Increase activity slowly   Complete by: As directed       Allergies as of 04/16/2023       Reactions   Ace Inhibitors    Patient doesn't recall reaction.  jkl   Benicar [olmesartan]    It made her sick   Aspirin Other (See Comments)   bruising   Citalopram Hydrobromide Diarrhea        Medication List     TAKE these medications  cetirizine 10 MG tablet Commonly known as: ZYRTEC TAKE ONE TABLET BY MOUTH EVERY DAY   [employer] Senna Plus 8.6-50 MG tablet Generic drug: senna-docusate Take 1 tablet by mouth 2 (two) times daily.   diclofenac Sodium 1 % Gel Commonly known as: VOLTAREN Apply 4 g topically daily as needed (pain).   docusate sodium 100 MG capsule Commonly known as: COLACE Take 100 mg by mouth at bedtime.   hydrochlorothiazide 12.5 MG capsule Commonly known as: MICROZIDE Take 1 capsule (12.5 mg total) by mouth  daily.   hydrOXYzine 50 MG capsule Commonly known as: VISTARIL Take 1 capsule (50 mg total) by mouth every 6 (six) hours as needed for anxiety.   methocarbamol 500 MG tablet Commonly known as: ROBAXIN Take 1 tablet (500 mg total) by mouth every 8 (eight) hours as needed for muscle spasms.   multivitamin with minerals Tabs tablet Take 1 tablet by mouth daily.   pantoprazole 40 MG tablet Commonly known as: PROTONIX Take 1 tablet (40 mg total) by mouth 2 (two) times daily. What changed: Another medication with the same name was removed. Continue taking this medication, and follow the directions you see here.   polyethylene glycol 17 g packet Commonly known as: MIRALAX / GLYCOLAX Take 17 g by mouth daily as needed for mild constipation.   potassium chloride SA 20 MEQ tablet Commonly known as: KLOR-CON M Take 1 tablet (20 mEq total) by mouth daily for 3 days.   Pramipexole Dihydrochloride 0.75 MG Tb24 Take 1 tablet (0.75 mg total) by mouth in the morning and at bedtime.   QUEtiapine 100 MG tablet Commonly known as: SEROQUEL TAKE ONE TABLET BY MOUTH AT BEDTIME   rosuvastatin 10 MG tablet Commonly known as: CRESTOR TAKE ONE TABLET BY MOUTH EVERY DAY   solifenacin 5 MG tablet Commonly known as: VESICARE TAKE ONE TABLET BY MOUTH EVERY DAY   sucralfate 1 g tablet Commonly known as: Carafate Take 1 tablet (1 g total) by mouth 4 (four) times daily -  with meals and at bedtime.   traMADol 50 MG tablet Commonly known as: ULTRAM Take 1-2 tablets (50-100 mg total) by mouth every 8 (eight) hours as needed for severe pain (pain score 7-10).   Vitamin D3 50 MCG (2000 UT) capsule Take 1 capsule (2,000 Units total) by mouth daily.               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  A cane, crutch, or walker will not resolve  issue with performing activities of daily living. A wheelchair will allow patient to safely perform daily activities. Patient is not able to propel themselves in the home using a standard weight wheelchair due to arm weakness, endurance, and general weakness. Patient can self propel in the lightweight wheelchair. Length of need Lifetime. Accessories: elevating leg rests (ELRs), wheel locks, extensions and anti-tippers.   04/16/23 1106            Follow-up Information     [facility] Office Follow up.   Specialty: Home Health Services Why: Frances [facility] will contact you within  48 hours of DC to arrange a home health appointment Contact information: [address] [place] [address] [phone]               Allergies  Allergen Reactions   Ace Inhibitors     Patient doesn't recall reaction.  jkl   Benicar [Olmesartan]     It made her sick   Aspirin Other (See Comments)    bruising   Citalopram Hydrobromide Diarrhea    Consultations: Gastroenterology   Procedures/Studies: No results found.    Subjective:  No significant events overnight, she denies any complaints Discharge Exam: Vitals:   04/16/23 0556 04/16/23 1110  BP: 133/72 129/65  Pulse:  (!) 103  Resp:  20  Temp:  99.3 F (37.4 C)  SpO2:  94%   Vitals:   04/16/23 0000 04/16/23 0400 04/16/23 0556 04/16/23 1110  BP: 120/64 (!) 144/79 133/72 129/65  Pulse: 80 87  (!) 103  Resp: 14 13  20   Temp: 98.2 F (36.8 C) 98 F (36.7 C)  99.3 F (37.4 C)  TempSrc: Oral Oral  Axillary  SpO2: 91% 92%  94%  Weight:      Height:        General: Pt is alert, awake, not in acute distress Cardiovascular: RRR, S1/S2 +, no rubs, no gallops Respiratory: CTA bilaterally, no wheezing, no  rhonchi Abdominal: Soft, NT, ND, bowel sounds + Extremities: no edema, no cyanosis    The results of significant diagnostics from this hospitalization (including imaging, microbiology, ancillary and laboratory) are listed below for reference.     Microbiology: No results found for this or any previous visit (from the past 240 hours).   Labs: BNP (last 3 results) Recent Labs    04/14/23 0435 04/15/23 0432 04/16/23 0405  BNP 201.7* 76.1 118.1*   Basic Metabolic Panel: Recent Labs  Lab 04/12/23 1408 04/13/23 0435 04/14/23 0435 04/15/23 0432 04/16/23 0405  NA 140 142 141 143 143  K 4.1 3.4* 3.5 3.5 3.3*  CL 106 112* 105 106 105  CO2 29 25 24 27 28   GLUCOSE 119* 100* 97 117* 98  BUN 45* 27* 13 9 6*  CREATININE 1.27* 1.24* 1.03* 1.07* 1.15*  CALCIUM 8.4* 7.9* 8.7* 8.5* 8.8*  MG  --   --  1.8 1.8 1.7  PHOS  --   --  3.3 3.9 3.3   Liver Function Tests: Recent Labs  Lab 04/12/23 1614 04/13/23 0435  AST 18 19  ALT 11 13  ALKPHOS 37* 34*  BILITOT 0.5 0.8  PROT 5.9* 4.9*  ALBUMIN 3.3* 2.5*   No results for input(s): "LIPASE", "AMYLASE" in the last 168 hours. No results for input(s): "AMMONIA" in the last 168 hours. CBC: Recent Labs  Lab 04/13/23 2246 04/14/23 0435 04/14/23 1234 04/14/23 1838 04/15/23 0432 04/16/23 0405  WBC 10.8* 9.5  --  9.9 7.1 8.1  NEUTROABS  --  4.7  --   --  4.1 4.4  HGB 9.7* 9.1* 10.6* 10.2* 9.0* 8.9*  HCT 28.9* 27.3* 32.2* 30.9* 26.9* 26.7*  MCV 90.3 90.7  --  91.2 92.4 93.4  PLT 207 180  --  244 230 233   Cardiac Enzymes: No results for input(s): "CKTOTAL", "CKMB", "CKMBINDEX", "TROPONINI" in the last 168 hours. BNP: Invalid input(s): "POCBNP" CBG: Recent Labs  Lab 04/13/23 0107  GLUCAP 93   D-Dimer No results for input(s): "DDIMER" in the last 72 hours. Hgb A1c No results for input(s): "HGBA1C" in  the last 72 hours. Lipid Profile No results for input(s): "CHOL", "HDL", "LDLCALC", "TRIG", "CHOLHDL", "LDLDIRECT" in the  last 72 hours. Thyroid function studies No results for input(s): "TSH", "T4TOTAL", "T3FREE", "THYROIDAB" in the last 72 hours.  Invalid input(s): "FREET3" Anemia work up No results for input(s): "VITAMINB12", "FOLATE", "FERRITIN", "TIBC", "IRON", "RETICCTPCT" in the last 72 hours. Urinalysis    Component Value Date/Time   COLORURINE YELLOW 10/18/2022 2100   APPEARANCEUR CLEAR 10/18/2022 2100   LABSPEC 1.027 10/18/2022 2100   PHURINE 5.0 10/18/2022 2100   GLUCOSEU NEGATIVE 10/18/2022 2100   GLUCOSEU NEGATIVE 06/25/2022 1531   HGBUR SMALL (A) 10/18/2022 2100   BILIRUBINUR NEGATIVE 10/18/2022 2100   KETONESUR NEGATIVE 10/18/2022 2100   PROTEINUR 30 (A) 10/18/2022 2100   UROBILINOGEN 4.0 (A) 06/25/2022 1531   NITRITE NEGATIVE 10/18/2022 2100   LEUKOCYTESUR NEGATIVE 10/18/2022 2100   Sepsis Labs Recent Labs  Lab 04/14/23 0435 04/14/23 1838 04/15/23 0432 04/16/23 0405  WBC 9.5 9.9 7.1 8.1   Microbiology No results found for this or any previous visit (from the past 240 hours).   Time coordinating discharge: Over 30 minutes  SIGNED:   Huey Bienenstock, MD  [facility] 04/16/2023, 2:31 PM Pager   If 7PM-7AM, please contact night-coverage [email or web address] Password [email or web address]

## 2023-04-16 NOTE — Care Management Important Message (Signed)
 Important Message  Patient Details  Name: Mallory Klein MRN: [id number] Date of Birth: [birth year]   Important Message Given:  Yes - Medicare IM     Dorena Bodo 04/16/2023, 3:42 PM

## 2023-04-16 NOTE — Plan of Care (Signed)
?  Problem: Education: ?Goal: Knowledge of General Education information will improve ?Description: Including pain rating scale, medication(s)/side effects and non-pharmacologic comfort measures ?Outcome: Progressing ?  ?Problem: Clinical Measurements: ?Goal: Ability to maintain clinical measurements within normal limits will improve ?Outcome: Progressing ?Goal: Diagnostic test results will improve ?Outcome: Progressing ?Goal: Cardiovascular complication will be avoided ?Outcome: Progressing ?  ?Problem: Activity: ?Goal: Risk for activity intolerance will decrease ?Outcome: Progressing ?  ?Problem: Nutrition: ?Goal: Adequate nutrition will be maintained ?Outcome: Progressing ?  ?Problem: Coping: ?Goal: Level of anxiety will decrease ?Outcome: Progressing ?  ?

## 2023-04-16 NOTE — Progress Notes (Signed)
 AVS reviewed with pt by primary RN Myracle Febres. All questions answered by pt, and pt verbalized understanding. PIV removed and documented. Pt left unit to go to discharge lounge in NAD.

## 2023-04-16 NOTE — Discharge Instructions (Signed)
Follow with Primary MD Plotnikov, Aleksei V, MD in 7 days   Get CBC, CMP,  checked  by Primary MD next visit.    Activity: As tolerated with Full fall precautions use walker/cane & assistance as needed   Disposition Home    Diet: Heart Healthy    On your next visit with your primary care physician please Get Medicines reviewed and adjusted.   Please request your Prim.MD to go over all Hospital Tests and Procedure/Radiological results at the follow up, please get all Hospital records sent to your Prim MD by signing hospital release before you go home.   If you experience worsening of your admission symptoms, develop shortness of breath, life threatening emergency, suicidal or homicidal thoughts you must seek medical attention immediately by calling 911 or calling your MD immediately  if symptoms less severe.  You Must read complete instructions/literature along with all the possible adverse reactions/side effects for all the Medicines you take and that have been prescribed to you. Take any new Medicines after you have completely understood and accpet all the possible adverse reactions/side effects.   Do not drive, operating heavy machinery, perform activities at heights, swimming or participation in water activities or provide baby sitting services if your were admitted for syncope or siezures until you have seen by Primary MD or a Neurologist and advised to do so again.  Do not drive when taking Pain medications.    Do not take more than prescribed Pain, Sleep and Anxiety Medications  Special Instructions: If you have smoked or chewed Tobacco  in the last 2 yrs please stop smoking, stop any regular Alcohol  and or any Recreational drug use.  Wear Seat belts while driving.   Please note  You were cared for by a hospitalist during your hospital stay. If you have any questions about your discharge medications or the care you received while you were in the hospital after you are  discharged, you can call the unit and asked to speak with the hospitalist on call if the hospitalist that took care of you is not available. Once you are discharged, your primary care physician will handle any further medical issues. Please note that NO REFILLS for any discharge medications will be authorized once you are discharged, as it is imperative that you return to your primary care physician (or establish a relationship with a primary care physician if you do not have one) for your aftercare needs so that they can reassess your need for medications and monitor your lab values.  

## 2023-04-18 ENCOUNTER — Encounter (HOSPITAL_COMMUNITY): Payer: Self-pay | Admitting: Internal Medicine

## 2023-04-19 ENCOUNTER — Telehealth: Payer: Self-pay

## 2023-04-19 NOTE — Transitions of Care (Post Inpatient/ED Visit) (Signed)
 04/19/2023  Name: Mallory Klein MRN: [id number] DOB: [birth year]  Today's TOC FU Call Status: Today's TOC FU Call Status:: Successful TOC FU Call Completed TOC FU Call Complete Date: 04/19/23 Patient's Name and Date of Birth confirmed.  Transition Care Management Follow-up Telephone Call Date of Discharge: 04/16/23 Discharge Facility: [facility]) Type of Discharge: Inpatient Admission Primary Inpatient Discharge Diagnosis:: hypokalemia How have you been since you were released from the hospital?: Better Any questions or concerns?: No  Items Reviewed: Did you receive and understand the discharge instructions provided?: Yes Medications obtained,verified, and reconciled?: Yes (Medications Reviewed) Any new allergies since your discharge?: No Dietary orders reviewed?: Yes Do you have support at home?: Yes People in Home: spouse  Medications Reviewed Today: Medications Reviewed Today     Reviewed by Karena Addison, LPN (Licensed Practical Nurse) on 04/19/23 at 1416  Med List Status: <None>   Medication Order Taking? Sig Documenting Provider Last Dose Status Informant  cetirizine (ZYRTEC) 10 MG tablet [id number] No TAKE ONE TABLET BY MOUTH EVERY DAY Plotnikov, Georgina Quint, MD Past Week Active Self           Med Note Jory Ee, TYELISHA L   Mon Apr 12, 2023  6:21 PM)    Cholecalciferol (VITAMIN D3) 50 MCG (2000 UT) capsule [id number] No Take 1 capsule (2,000 Units total) by mouth daily. Plotnikov, Georgina Quint, MD Past Week Active Self  [facility] SENNA PLUS 8.6-50 MG tablet [id number] No Take 1 tablet by mouth 2 (two) times daily. [provider] Past Week Active Self  diclofenac Sodium (VOLTAREN) 1 % GEL [id number] No Apply 4 g topically daily as needed (pain). [provider] Past Week Active Self  docusate sodium (COLACE) 100 MG capsule [id number] No Take 100 mg by mouth at bedtime. [provider] Past Week Active Self  Discontinued 07/23/20 1924           Med Note Deatra James   Tue Jun 25, 2020  2:21 PM)    hydrochlorothiazide (MICROZIDE) 12.5 MG capsule [id number] No Take 1 capsule (12.5 mg total) by mouth daily. Plotnikov, Georgina Quint, MD Past Week Active Self  hydrOXYzine (VISTARIL) 50 MG capsule [id number] No Take 1 capsule (50 mg total) by mouth every 6 (six) hours as needed for anxiety. Narda Bonds, MD Past Week Active Self  methocarbamol (ROBAXIN) 500 MG tablet [id number] No Take 1 tablet (500 mg total) by mouth every 8 (eight) hours as needed for muscle spasms. Plotnikov, Georgina Quint, MD Past Week Active Self  Multiple Vitamin (MULTIVITAMIN WITH MINERALS) TABS tablet [id number] No Take 1 tablet by mouth daily. Narda Bonds, MD Past Week Active Self  pantoprazole (PROTONIX) 40 MG tablet [id number]  Take 1 tablet (40 mg total) by mouth 2 (two) times daily. Elgergawy, Leana Roe, MD  Active   polyethylene glycol (MIRALAX / GLYCOLAX) 17 g packet [id number] No Take 17 g by mouth daily as needed for mild constipation. [provider] Past Week Active Self  potassium chloride SA (KLOR-CON M) 20 MEQ tablet [id number]  Take 1 tablet (20 mEq total) by mouth daily for 3 days. Elgergawy, Leana Roe, MD  Active   Pramipexole Dihydrochloride 0.75 MG TB24 [id number] No Take 1 tablet (0.75 mg total) by mouth in the morning and at bedtime. Shawnie Dapper, NP Past Week Active Self           Med Note Donell Beers Apr 12, 2023  6:19 PM)  QUEtiapine (SEROQUEL) 100 MG tablet [id number] No TAKE ONE TABLET BY MOUTH AT BEDTIME Plotnikov, Georgina Quint, MD Past Week Active Self  rosuvastatin (CRESTOR) 10 MG tablet [id number] No TAKE ONE TABLET BY MOUTH EVERY DAY  Patient not taking: Reported on 04/13/2023   Plotnikov, Georgina Quint, MD Not Taking Active Self  solifenacin (VESICARE) 5 MG tablet [id number] No TAKE ONE TABLET BY MOUTH EVERY DAY Plotnikov, Georgina Quint, MD Past Week Active Self  sucralfate (CARAFATE) 1 g tablet [id number] No Take 1 tablet (1 g total) by  mouth 4 (four) times daily -  with meals and at bedtime. Narda Bonds, MD Taking Expired 11/20/22 2359   traMADol (ULTRAM) 50 MG tablet [id number] No Take 1-2 tablets (50-100 mg total) by mouth every 8 (eight) hours as needed for severe pain (pain score 7-10). Plotnikov, Georgina Quint, MD Past Week Active Self  Discontinued 07/23/20 1924             Home Care and Equipment/Supplies: Were Home Health Services Ordered?: Yes Name of Home Health Agency:: unknown Has Agency set up a time to come to your home?: No Any new equipment or medical supplies ordered?: Yes Name of Medical supply agency?: unknown Were you able to get the equipment/medical supplies?: Yes Do you have any questions related to the use of the equipment/supplies?: No  Functional Questionnaire: Do you need assistance with bathing/showering or dressing?: Yes Do you need assistance with meal preparation?: Yes Do you need assistance with eating?: No Do you have difficulty maintaining continence: No Do you need assistance with getting out of bed/getting out of a chair/moving?: Yes Do you have difficulty managing or taking your medications?: No  Follow up appointments reviewed: PCP Follow-up appointment confirmed?: Yes Date of PCP follow-up appointment?: 04/22/23 Follow-up Provider: [facility] Follow-up appointment confirmed?: NA Do you need transportation to your follow-up appointment?: No Do you understand care options if your condition(s) worsen?: Yes-patient verbalized understanding    SIGNATURE Karena Addison, LPN [facility] Nurse Health Advisor Direct Dial [phone]

## 2023-04-22 ENCOUNTER — Ambulatory Visit: Payer: Medicare Other | Admitting: Internal Medicine

## 2023-04-22 ENCOUNTER — Telehealth: Payer: Self-pay | Admitting: *Deleted

## 2023-04-22 ENCOUNTER — Telehealth: Payer: Self-pay

## 2023-04-22 ENCOUNTER — Encounter: Payer: Self-pay | Admitting: Internal Medicine

## 2023-04-22 VITALS — BP 140/86 | HR 112 | Temp 98.7°F | Ht 61.5 in | Wt 159.0 lb

## 2023-04-22 DIAGNOSIS — Z9889 Other specified postprocedural states: Secondary | ICD-10-CM

## 2023-04-22 DIAGNOSIS — E559 Vitamin D deficiency, unspecified: Secondary | ICD-10-CM

## 2023-04-22 DIAGNOSIS — K922 Gastrointestinal hemorrhage, unspecified: Secondary | ICD-10-CM | POA: Diagnosis not present

## 2023-04-22 DIAGNOSIS — R269 Unspecified abnormalities of gait and mobility: Secondary | ICD-10-CM

## 2023-04-22 DIAGNOSIS — E538 Deficiency of other specified B group vitamins: Secondary | ICD-10-CM | POA: Diagnosis not present

## 2023-04-22 DIAGNOSIS — G20A1 Parkinson's disease without dyskinesia, without mention of fluctuations: Secondary | ICD-10-CM | POA: Diagnosis not present

## 2023-04-22 DIAGNOSIS — E876 Hypokalemia: Secondary | ICD-10-CM | POA: Diagnosis not present

## 2023-04-22 DIAGNOSIS — I10 Essential (primary) hypertension: Secondary | ICD-10-CM | POA: Diagnosis not present

## 2023-04-22 DIAGNOSIS — G8929 Other chronic pain: Secondary | ICD-10-CM | POA: Diagnosis not present

## 2023-04-22 DIAGNOSIS — M5440 Lumbago with sciatica, unspecified side: Secondary | ICD-10-CM

## 2023-04-22 DIAGNOSIS — R32 Unspecified urinary incontinence: Secondary | ICD-10-CM | POA: Diagnosis not present

## 2023-04-22 DIAGNOSIS — K921 Melena: Secondary | ICD-10-CM

## 2023-04-22 MED ORDER — POTASSIUM CHLORIDE CRYS ER 20 MEQ PO TBCR
20.0000 meq | EXTENDED_RELEASE_TABLET | Freq: Every day | ORAL | 3 refills | Status: AC
Start: 2023-04-22 — End: 2023-11-22

## 2023-04-22 MED ORDER — VITAMIN B-12 5000 MCG SL SUBL: SUBLINGUAL_TABLET | SUBLINGUAL | 3 refills | Status: DC

## 2023-04-22 MED ORDER — PANTOPRAZOLE SODIUM 40 MG PO TBEC: 40.0000 mg | DELAYED_RELEASE_TABLET | Freq: Two times a day (BID) | ORAL | 3 refills | Status: DC

## 2023-04-22 NOTE — Assessment & Plan Note (Signed)
 Tramadol prn  Potential benefits of a long term opioids use as well as potential risks (i.e. addiction risk, apnea etc) and complications (i.e. Somnolence, constipation and others) were explained to the patient and were aknowledged.

## 2023-04-22 NOTE — Assessment & Plan Note (Signed)
 LBP - under control w/prn meds/rest

## 2023-04-22 NOTE — Assessment & Plan Note (Signed)
 The pt wants to switch from Dr Frances Furbish

## 2023-04-22 NOTE — Progress Notes (Unsigned)
 Complex Care Management Note Care Guide Note  04/22/2023 Name: Mallory Klein MRN: [id number] DOB: 1945/03/04   Complex Care Management Outreach Attempts: An unsuccessful telephone outreach was attempted today to offer the patient information about available complex care management services.  Follow Up Plan:  Additional outreach attempts will be made to offer the patient complex care management information and services.   Encounter Outcome:  No Answer  Burman Nieves, CMA, Care Guide [facility]  [facility], [facility] Guide Direct Dial: [phone]  Fax: [phone] Website: [email or web address]

## 2023-04-22 NOTE — Assessment & Plan Note (Signed)
 PT offered and declined

## 2023-04-22 NOTE — Assessment & Plan Note (Signed)
 On Vit D

## 2023-04-22 NOTE — Progress Notes (Signed)
 Subjective:  Patient ID: Mallory Klein, female    DOB: 1945/05/23  Age: 78 y.o. MRN: [id number]  CC: Follow-up (Was in the Hospital for 4 days )   HPI Mallory Klein presents for post-hospital f/u for upper GI bleeding - gastric ulcers W/c was delivered  Per hx:  "Admit date: 04/12/2023 Discharge date: 04/16/2023   Admitted From: (Home) Disposition:  (Home    Recommendations for Outpatient Follow-up:  Follow up with PCP in 1-2 weeks Please obtain BMP/CBC in one week   Diet recommendation: Heart Healthy    Brief/Interim Summary:   78 y.o. female with medical history significant of hypertension, GERD, gastritis, PUD, anemia, asthma, anxiety, memory loss, Parkinson's disease, OSA, OAB, obesity, low back pain, spinal stenosis, status post laminectomy presenting with melena and possible vaginal bleeding.   Patient reports she has had 2 days of melanotic stools.  She also suspicious for possible vaginal bleeding.  Denies any recent NSAID use.  Does report abdominal pain.  Has had admission for GI bleed in August of last year secondary to PUD and esophagitis.  Did not require transfusion at that time.    Symptomatic acute blood loss anemia anemia GI bleed, presents with both melena and hematochezia Acute GI bleed(both upper and lower) upper secondary to gastritis, esophagitis, duodenal angiectasia, and lower GI bleed secondary to diverticular bleed -Significant drop from baseline hemoglobin of 11.4, to 5.4 on admission. -GI input greatly appreciated, recommendation for endoscopy, colonoscopy, but this has been postponed as she was unable to clear her diet on day 1, so she required another bowel prep.  -her EGD/colonoscopy from 2/20. Colonoscopy showed dark red blood throughout the colon that was cleared off by GI physician,. There were no signs of reaccumulation. This suggest to me that she likely had a diverticular bleed that has now resolved. The ileum had no signs of blood.  EGD did show grade a esophagitis, esophageal stricture, gastritis, 2 duodenal angiectasia's where treated with APC, and 2 duodenal ulcers.  -Rec PPI twice daily for 12 weeks.  Discussed with patient to avoid using NSAIDs. -Transfused 3 units PRBC, hemoglobin has stabilized   Hypertension   -Resume home regimen   Hyperlipidemia  - Continue home rosuvastatin   Anxiety  - Continue home Seroquel and hydroxyzine   Parkinson disease  - Continue on pramipexole   OSA  - No longer on CPAP   OAB - Replace home Vesicare with formulary Toviaz   Low back pain - Spinal stenosis - History of laminectomy  - Noted, supportive care       Discharge Diagnoses:  Principal Problem:   Symptomatic anemia Active Problems:   Morbid obesity   Essential hypertension   Asthma   GERD (gastroesophageal reflux disease)   OSA (obstructive sleep apnea)   Low back pain   Parkinson disease (HCC)   S/P lumbar laminectomy   Memory loss   Anxiety   OAB (overactive bladder)   Spinal stenosis of lumbar region without neurogenic claudication   Gastritis   Peptic ulcer disease   GI bleed   Melena   Hematochezia       Discharge Instructions   Discharge Instructions       Diet - low sodium heart healthy   Complete by: As directed      Increase activity slowly   Complete by: As directed           Allergies as of 04/16/2023         Reactions  Ace Inhibitors      Patient doesn't recall reaction.  jkl    Benicar [olmesartan]      It made her sick    Aspirin Other (See Comments)    bruising    Citalopram Hydrobromide Diarrhea            Medication List       TAKE these medications     cetirizine 10 MG tablet Commonly known as: ZYRTEC TAKE ONE TABLET BY MOUTH EVERY DAY    [facility] Senna Plus 8.6-50 MG tablet Generic drug: senna-docusate Take 1 tablet by mouth 2 (two) times daily.    diclofenac Sodium 1 % Gel Commonly known as: VOLTAREN Apply 4 g topically daily as needed (pain).     docusate sodium 100 MG capsule Commonly known as: COLACE Take 100 mg by mouth at bedtime.    hydrochlorothiazide 12.5 MG capsule Commonly known as: MICROZIDE Take 1 capsule (12.5 mg total) by mouth daily.    hydrOXYzine 50 MG capsule Commonly known as: VISTARIL Take 1 capsule (50 mg total) by mouth every 6 (six) hours as needed for anxiety.    methocarbamol 500 MG tablet Commonly known as: ROBAXIN Take 1 tablet (500 mg total) by mouth every 8 (eight) hours as needed for muscle spasms.    multivitamin with minerals Tabs tablet Take 1 tablet by mouth daily.    pantoprazole 40 MG tablet Commonly known as: PROTONIX Take 1 tablet (40 mg total) by mouth 2 (two) times daily. What changed: Another medication with the same name was removed. Continue taking this medication, and follow the directions you see here.    polyethylene glycol 17 g packet Commonly known as: MIRALAX / GLYCOLAX Take 17 g by mouth daily as needed for mild constipation.    potassium chloride SA 20 MEQ tablet Commonly known as: KLOR-CON M Take 1 tablet (20 mEq total) by mouth daily for 3 days.    Pramipexole Dihydrochloride 0.75 MG Tb24 Take 1 tablet (0.75 mg total) by mouth in the morning and at bedtime.    QUEtiapine 100 MG tablet Commonly known as: SEROQUEL TAKE ONE TABLET BY MOUTH AT BEDTIME    rosuvastatin 10 MG tablet Commonly known as: CRESTOR TAKE ONE TABLET BY MOUTH EVERY DAY    solifenacin 5 MG tablet Commonly known as: VESICARE TAKE ONE TABLET BY MOUTH EVERY DAY    sucralfate 1 g tablet Commonly known as: Carafate Take 1 tablet (1 g total) by mouth 4 (four) times daily -  with meals and at bedtime.    traMADol 50 MG tablet Commonly known as: ULTRAM Take 1-2 tablets (50-100 mg total) by mouth every 8 (eight) hours as needed for severe pain (pain score 7-10).    Vitamin D3 50 MCG (2000 UT) capsule Take 1 capsule (2,000 Units total) by mouth daily.          "  Outpatient Medications  Prior to Visit  Medication Sig Dispense Refill   cetirizine (ZYRTEC) 10 MG tablet TAKE ONE TABLET BY MOUTH EVERY DAY 90 tablet 3   Cholecalciferol (VITAMIN D3) 50 MCG (2000 UT) capsule Take 1 capsule (2,000 Units total) by mouth daily. 100 capsule 3   [facility] SENNA PLUS 8.6-50 MG tablet Take 1 tablet by mouth 2 (two) times daily.     diclofenac Sodium (VOLTAREN) 1 % GEL Apply 4 g topically daily as needed (pain).     docusate sodium (COLACE) 100 MG capsule Take 100 mg by mouth at bedtime.  hydrochlorothiazide (MICROZIDE) 12.5 MG capsule Take 1 capsule (12.5 mg total) by mouth daily. 90 capsule 3   hydrOXYzine (VISTARIL) 50 MG capsule Take 1 capsule (50 mg total) by mouth every 6 (six) hours as needed for anxiety.     methocarbamol (ROBAXIN) 500 MG tablet Take 1 tablet (500 mg total) by mouth every 8 (eight) hours as needed for muscle spasms. 60 tablet 2   Multiple Vitamin (MULTIVITAMIN WITH MINERALS) TABS tablet Take 1 tablet by mouth daily.     polyethylene glycol (MIRALAX / GLYCOLAX) 17 g packet Take 17 g by mouth daily as needed for mild constipation.     Pramipexole Dihydrochloride 0.75 MG TB24 Take 1 tablet (0.75 mg total) by mouth in the morning and at bedtime. 180 tablet 1   QUEtiapine (SEROQUEL) 100 MG tablet TAKE ONE TABLET BY MOUTH AT BEDTIME 90 tablet 1   rosuvastatin (CRESTOR) 10 MG tablet TAKE ONE TABLET BY MOUTH EVERY DAY 90 tablet 1   solifenacin (VESICARE) 5 MG tablet TAKE ONE TABLET BY MOUTH EVERY DAY 90 tablet 1   traMADol (ULTRAM) 50 MG tablet Take 1-2 tablets (50-100 mg total) by mouth every 8 (eight) hours as needed for severe pain (pain score 7-10). 180 tablet 3   pantoprazole (PROTONIX) 40 MG tablet Take 1 tablet (40 mg total) by mouth 2 (two) times daily. 180 tablet 0   potassium chloride SA (KLOR-CON M) 20 MEQ tablet Take 1 tablet (20 mEq total) by mouth daily for 3 days. 3 tablet 0   sucralfate (CARAFATE) 1 g tablet Take 1 tablet (1 g total) by mouth 4 (four) times daily -   with meals and at bedtime. 120 tablet 0   No facility-administered medications prior to visit.    ROS: Review of Systems  Constitutional:  Positive for fatigue. Negative for activity change, appetite change, chills and unexpected weight change.  HENT:  Negative for congestion, mouth sores and sinus pressure.   Eyes:  Negative for visual disturbance.  Respiratory:  Negative for cough and chest tightness.   Gastrointestinal:  Negative for abdominal pain and nausea.  Genitourinary:  Negative for difficulty urinating, frequency and vaginal pain.  Musculoskeletal:  Positive for arthralgias, back pain and gait problem.  Skin:  Negative for pallor and rash.  Neurological:  Positive for weakness and numbness. Negative for dizziness, tremors and headaches.  Hematological:  Bruises/bleeds easily.  Psychiatric/Behavioral:  Negative for confusion, sleep disturbance and suicidal ideas. The patient is nervous/anxious.     Objective:  BP (!) 140/86 (BP Location: Left Arm, Patient Position: Sitting)   Pulse (!) 112   Temp 98.7 F (37.1 C) (Temporal)   Ht 5' 1.5" (1.562 m)   Wt 159 lb (72.1 kg)   SpO2 96%   BMI 29.56 kg/m   BP Readings from Last 3 Encounters:  04/22/23 (!) 140/86  04/16/23 129/65  12/25/22 (!) 82/48    Wt Readings from Last 3 Encounters:  04/22/23 159 lb (72.1 kg)  04/15/23 132 lb 4.4 oz (60 kg)  12/25/22 127 lb (57.6 kg)    Physical Exam Constitutional:      General: She is not in acute distress.    Appearance: She is well-developed. She is obese.  HENT:     Head: Normocephalic.     Right Ear: External ear normal.     Left Ear: External ear normal.     Nose: Nose normal.  Eyes:     General:  Right eye: No discharge.        Left eye: No discharge.     Conjunctiva/sclera: Conjunctivae normal.     Pupils: Pupils are equal, round, and reactive to light.  Neck:     Thyroid: No thyromegaly.     Vascular: No JVD.     Trachea: No tracheal deviation.   Cardiovascular:     Rate and Rhythm: Normal rate and regular rhythm.     Heart sounds: Normal heart sounds.  Pulmonary:     Effort: No respiratory distress.     Breath sounds: No stridor. No wheezing.  Abdominal:     General: Bowel sounds are normal. There is no distension.     Palpations: Abdomen is soft. There is no mass.     Tenderness: There is no abdominal tenderness. There is no guarding or rebound.  Musculoskeletal:        General: No tenderness.     Cervical back: Normal range of motion and neck supple. No rigidity.     Right lower leg: No edema.     Left lower leg: No edema.  Lymphadenopathy:     Cervical: No cervical adenopathy.  Skin:    Findings: No erythema or rash.  Neurological:     Mental Status: She is oriented to person, place, and time.     Cranial Nerves: No cranial nerve deficit.     Motor: No abnormal muscle tone.     Coordination: Coordination normal.     Gait: Gait normal.     Deep Tendon Reflexes: Reflexes normal.  Psychiatric:        Behavior: Behavior normal.        Thought Content: Thought content normal.        Judgment: Judgment normal.     Lab Results  Component Value Date   WBC 8.1 04/16/2023   HGB 8.9 (L) 04/16/2023   HCT 26.7 (L) 04/16/2023   PLT 233 04/16/2023   GLUCOSE 98 04/16/2023   CHOL 200 (H) 01/07/2021   TRIG 53 01/07/2021   HDL 79 01/07/2021   LDLDIRECT 109.8 02/10/2010   LDLCALC 111 (H) 01/07/2021   ALT 13 04/13/2023   AST 19 04/13/2023   NA 143 04/16/2023   K 3.3 (L) 04/16/2023   CL 105 04/16/2023   CREATININE 1.15 (H) 04/16/2023   BUN 6 (L) 04/16/2023   CO2 28 04/16/2023   TSH 0.55 06/25/2022   INR 1.2 04/12/2023   HGBA1C 6.1 06/25/2022    No results found.  Assessment & Plan:   Problem List Items Addressed This Visit     Essential hypertension   Hold Verapamil due to swelling. Reduce dose      Urinary incontinence   Continue w/Ditropan 5 mg tid prn  Potential benefits of a long term Ditropan  use as  well as potential risks  and complications were explained to the patient and were aknowledged.        Low back pain   Tramadol prn  Potential benefits of a long term opioids use as well as potential risks (i.e. addiction risk, apnea etc) and complications (i.e. Somnolence, constipation and others) were explained to the patient and were aknowledged.      Parkinson disease (HCC)    The pt wants to switch from Dr Frances Furbish       Relevant Orders   Comprehensive metabolic panel   CBC with Differential/Platelet   TSH   Iron, TIBC and Ferritin Panel   S/P lumbar laminectomy  LBP - under control w/prn meds/rest      Vitamin D deficiency   On Vit D      Relevant Orders   Comprehensive metabolic panel   CBC with Differential/Platelet   TSH   Iron, TIBC and Ferritin Panel   B12 deficiency   Re-start B12 Risks associated with treatment noncompliance were discussed. Compliance was encouraged.       Gait disorder   PT offered and declined      Acute upper GI bleed - Primary   S/p 3 u RBC transfusion F/u w/Dr Marina Goodell - 05/2019 Duodenal/gastric ulcers No NSAIDs      Relevant Orders   Comprehensive metabolic panel   CBC with Differential/Platelet   TSH   Iron, TIBC and Ferritin Panel   Other Visit Diagnoses       Hypokalemia       Relevant Medications   potassium chloride SA (KLOR-CON M) 20 MEQ tablet         Meds ordered this encounter  Medications   Cyanocobalamin (VITAMIN B-12) 5000 MCG SUBL    Sig: One SL daily    Dispense:  100 tablet    Refill:  3   pantoprazole (PROTONIX) 40 MG tablet    Sig: Take 1 tablet (40 mg total) by mouth 2 (two) times daily.    Dispense:  180 tablet    Refill:  3   potassium chloride SA (KLOR-CON M) 20 MEQ tablet    Sig: Take 1 tablet (20 mEq total) by mouth daily for 3 days.    Dispense:  90 tablet    Refill:  3      Follow-up: Return for a follow-up visit.  Sonda Primes, MD

## 2023-04-22 NOTE — Assessment & Plan Note (Signed)
 Continue w/Ditropan 5 mg tid prn  Potential benefits of a long term Ditropan  use as well as potential risks  and complications were explained to the patient and were aknowledged.

## 2023-04-22 NOTE — Assessment & Plan Note (Addendum)
 S/p 3 u RBC transfusion F/u w/Dr Marina Goodell - 05/2019 Duodenal/gastric ulcers No NSAIDs

## 2023-04-22 NOTE — Assessment & Plan Note (Signed)
 Hold Verapamil due to swelling. Reduce dose

## 2023-04-23 NOTE — Progress Notes (Signed)
 Complex Care Management Note  Care Guide Note 04/23/2023 Name: JANISA LABUS MRN: [id number] DOB: Oct 18, 1945  Rochel Brome Tesoriero is a 78 y.o. year old female who sees Plotnikov, Georgina Quint, MD for primary care. I reached out to [employer] by phone today to offer complex care management services.  Ms. Vejar was given information about Complex Care Management services today including:   The Complex Care Management services include support from the care team which includes your Nurse Care Manager, Clinical Social Worker, or Pharmacist.  The Complex Care Management team is here to help remove barriers to the health concerns and goals most important to you. Complex Care Management services are voluntary, and the patient may decline or stop services at any time by request to their care team member.   Complex Care Management Consent Status: Patient agreed to services and verbal consent obtained.   Follow up plan:  Telephone appointment with complex care management team member scheduled for:  04/26/2023  Encounter Outcome:  Patient Scheduled  Burman Nieves, CMA, Care Guide [facility]  [facility], [facility] Guide Direct Dial: [phone]  Fax: [phone] Website: [email or web address]

## 2023-04-26 ENCOUNTER — Ambulatory Visit: Payer: Self-pay | Admitting: Licensed Clinical Social Worker

## 2023-04-26 NOTE — Patient Outreach (Signed)
 Care Coordination   Initial Visit Note   04/26/2023 Name: Mallory Klein MRN: [id number] DOB: 1945/05/08  Mallory Klein is a 78 y.o. year old female who sees Mallory Klein, Mallory Quint, MD for primary care. I spoke with  Mallory Klein by phone today.  What matters to the patients health and wellness today?  Mallory Klein reports feeling overwhelmed regarding the amount of medication. Mallory Klein is requesting assistance to help manage the amount of medication    Goals Addressed             This Visit's Progress    Care Coordination       . Collaborate with pharmacist for assistance with managing medication.  . It is okay to active medical provider the purpose, timeframe and recommendations regarding medication.  Marland KitchenKeep reminding yourself how far you come and your doing good.         SDOH assessments and interventions completed:  Yes  SDOH Interventions Today    Flowsheet Row Most Recent Value  SDOH Interventions   Food Insecurity Interventions Intervention Not Indicated  Housing Interventions Intervention Not Indicated  Transportation Interventions Intervention Not Indicated  Utilities Interventions Intervention Not Indicated        Care Coordination Interventions:  Yes, provided  Interventions Today    Flowsheet Row Most Recent Value  Mental Health Interventions   Mental Health Discussed/Reviewed Other  [Pt denies depressive symptoms and reports she is adjusting well at home. Spouse is supportive.]  Pharmacy Interventions   Pharmacy Dicussed/Reviewed Medications and their functions, Pharmacy Topics Reviewed, Medication Adherence, Referral to Pharmacist  Medication Adherence --  [pt is requesting pharmacy assistance to help with adherence.]  Referral to Pharmacist --  [assistance with adherence]       Follow up plan: Follow up call scheduled for 05/10/2023    Encounter Outcome:  Patient Visit Completed   Mallory Klein MSW, LCSW Licensed Clinical Social  Worker  [facility], Population Health Direct Dial: [phone]  Fax: [phone]

## 2023-04-26 NOTE — Patient Instructions (Signed)
 Visit Information  Thank you for taking time to visit with me today. Please don't hesitate to contact me if I can be of assistance to you.   Following are the goals we discussed today:   Goals Addressed             This Visit's Progress    Care Coordination       . Collaborate with pharmacist for assistance with managing medication.  . It is okay to active medical provider the purpose, timeframe and recommendations regarding medication.  Marland KitchenKeep reminding yourself how far you come and your doing good.         Our next appointment is by telephone on 05/10/2023 at 11am  Please call the care guide team at [phone] if you need to cancel or reschedule your appointment.   If you are experiencing a Mental Health or Behavioral Health Crisis or need someone to talk to, please call 911   Patient verbalizes understanding of instructions and care plan provided today and agrees to view in MyChart. Active MyChart status and patient understanding of how to access instructions and care plan via MyChart confirmed with patient.     Telephone follow up appointment with care management team member scheduled for:05/10/2023  Gwyndolyn Saxon MSW, LCSW Licensed Clinical Social Worker  [facility], Population Health Direct Dial: [phone]  Fax: [phone]

## 2023-04-27 ENCOUNTER — Telehealth: Payer: Self-pay | Admitting: Internal Medicine

## 2023-04-27 ENCOUNTER — Telehealth: Payer: Self-pay

## 2023-04-27 NOTE — Telephone Encounter (Signed)
 Can you get her set up with his next available?

## 2023-04-27 NOTE — Telephone Encounter (Unsigned)
 Copied from CRM [id number]. Topic: General - Other >> Apr 27, 2023  3:39 PM Mallory Klein wrote: Reason for CRM: patient called stating the nurse just left her house and she advised the patient to let the doctor know he feet are swollen. Patient would like to know if she can get mediation sent in to her pharmacy

## 2023-04-27 NOTE — Telephone Encounter (Signed)
 Patient is calling stating she has an appointment for 06/14/2023 with Dr. Marina Goodell, patient states she has no transportation for the morning time. Patient is wondering if she's able to get another date. Thank you

## 2023-04-27 NOTE — Telephone Encounter (Signed)
 Copied from CRM [id number]. Topic: General - Other >> Apr 27, 2023  4:27 PM Alcus Dad wrote: Reason for CRM: Eileen Stanford from [facility] called and stated that PT was referred from [facility] and start of care is delayed to March 7th per patient's request. If you have any questions please call [phone]

## 2023-04-28 MED ORDER — FUROSEMIDE 40 MG PO TABS: 40.0000 mg | ORAL_TABLET | Freq: Every day | ORAL | 5 refills | Status: AC | PRN

## 2023-04-28 NOTE — Telephone Encounter (Signed)
 furosemide prescription was emailed to the pharmacy.  Take as needed for swelling.  Thank you

## 2023-04-29 NOTE — Telephone Encounter (Signed)
 Per PCP "furosemide prescription was emailed to the pharmacy.  Take as needed for swelling.  Thank you"

## 2023-04-30 ENCOUNTER — Telehealth: Payer: Self-pay

## 2023-04-30 DIAGNOSIS — K921 Melena: Secondary | ICD-10-CM | POA: Diagnosis not present

## 2023-04-30 DIAGNOSIS — G20A1 Parkinson's disease without dyskinesia, without mention of fluctuations: Secondary | ICD-10-CM | POA: Diagnosis not present

## 2023-04-30 DIAGNOSIS — K2971 Gastritis, unspecified, with bleeding: Secondary | ICD-10-CM | POA: Diagnosis not present

## 2023-04-30 DIAGNOSIS — D62 Acute posthemorrhagic anemia: Secondary | ICD-10-CM | POA: Diagnosis not present

## 2023-04-30 NOTE — Telephone Encounter (Signed)
 Copied from CRM [id number]. Topic: Clinical - Home Health Verbal Orders >> Apr 30, 2023  2:41 PM Adele Barthel wrote: Caller/Agency: [facility] Callback Number: [phone] Service Requested: Physical Therapy and Skilled Nursing Frequency: PT- 2 x week for 3 weeks, 1 x week for 4 weeks to work on strengthening and balance Any new concerns about the patient? No

## 2023-05-03 NOTE — Telephone Encounter (Signed)
 Okay. Thank you.

## 2023-05-04 ENCOUNTER — Ambulatory Visit: Payer: Self-pay | Admitting: Internal Medicine

## 2023-05-04 DIAGNOSIS — K921 Melena: Secondary | ICD-10-CM | POA: Diagnosis not present

## 2023-05-04 DIAGNOSIS — G20A1 Parkinson's disease without dyskinesia, without mention of fluctuations: Secondary | ICD-10-CM | POA: Diagnosis not present

## 2023-05-04 DIAGNOSIS — K2971 Gastritis, unspecified, with bleeding: Secondary | ICD-10-CM | POA: Diagnosis not present

## 2023-05-04 DIAGNOSIS — I10 Essential (primary) hypertension: Secondary | ICD-10-CM

## 2023-05-04 DIAGNOSIS — D62 Acute posthemorrhagic anemia: Secondary | ICD-10-CM | POA: Diagnosis not present

## 2023-05-04 NOTE — Telephone Encounter (Signed)
 Chief Complaint: High BP Symptoms: None Frequency: n/a Pertinent Negatives: Patient denies n/a Disposition: [] ED /[] Urgent Care (no appt availability in office) / [] Appointment(In office/virtual)/ []  [place] [facility]/ [] Home Care/ [] Refused Recommended Disposition /[] [place] [facility]/ [x]  Follow-up with PCP Additional Notes: Home Health Physical Therapist, [name], contacted office to report high blood pressure of 140/100. Patient was alert and oriented x 4 on the phone. Patient states she has not been taking medication because it is 'a lot to take, can't keep up with it'. [name] states that patient receives medication in a 'bubble pack' from the pharmacy. Patient is unable to afford medications right now. Jim requesting referral to Social Work through [employer] to assist with finances for patient. Patient has also had a decreased appetite over the last few weeks. This RN advised [name] to assist patient to find the Hydrochlorothiazide and ensure patient is taking this daily. [name] was able to set up this medication in one specific location for patient to be able to take daily. [name] also states that a Home Health Nurse will be coming to the home later this week to help patient with medication management and do an assessment. Advised patient to follow up with PCP to make appt after Home Health Nurse is out there to discuss needs. Patietn denies any cardiac symptoms such as chest pain, difficulty breathing, headache, numbness/weakness.  [name] is requesting office faxes an updated medication list to the Home Health company asap to fax # [phone]. [name] can be reached at [phone].    Copied from CRM [id number]. Topic: Clinical - Red Word Triage >> May 04, 2023  5:13 PM [place] J wrote: [facility] that prompted transfer to Nurse Triage: Home health nurse calling in due to elevated BP. Patient hasn't eaten, or taken medications since Saturday. Additional Information  Commented on: [1] Systolic BP  >= 130 OR  Diastolic >= 80 AND [2] taking BP medications    Patient has not been taking medication - please see notes  Answer Assessment - Initial Assessment Questions 1. BLOOD PRESSURE: "What is the blood pressure?" "Did you take at least two measurements 5 minutes apart?"     140/100  2. ONSET: "When did you take your blood pressure?"     About 20 min ago  3. HOW: "How did you take your blood pressure?" (e.g., automatic home BP monitor, visiting nurse)     Manual cuff - 140/100 on upper arm 4. HISTORY: "Do you have a history of high blood pressure?"     Yes 5. MEDICINES: "Are you taking any medicines for blood pressure?" "Have you missed any doses recently?"     Hydrochlorothiazide  6. OTHER SYMPTOMS: "Do you have any symptoms?" (e.g., blurred vision, chest pain, difficulty breathing, headache, weakness)     Poor appetite, missed medications  Protocols used: Blood Pressure - High-A-AH

## 2023-05-05 DIAGNOSIS — K921 Melena: Secondary | ICD-10-CM | POA: Diagnosis not present

## 2023-05-05 DIAGNOSIS — G20A1 Parkinson's disease without dyskinesia, without mention of fluctuations: Secondary | ICD-10-CM | POA: Diagnosis not present

## 2023-05-05 DIAGNOSIS — D62 Acute posthemorrhagic anemia: Secondary | ICD-10-CM | POA: Diagnosis not present

## 2023-05-05 DIAGNOSIS — K2971 Gastritis, unspecified, with bleeding: Secondary | ICD-10-CM | POA: Diagnosis not present

## 2023-05-05 NOTE — Telephone Encounter (Signed)
 I was able to give verbal okay to [name] @ [facility] for Service Requested: Physical Therapy and Skilled Nursing Frequency: PT- 2 x week for 3 weeks, 1 x week for 4 weeks to work on strengthening and balance

## 2023-05-06 NOTE — Telephone Encounter (Signed)
 Okay. Thank you.

## 2023-05-06 NOTE — Addendum Note (Signed)
 Addended by: Tresa Garter on: 05/06/2023 07:47 AM   Modules accepted: Orders

## 2023-05-07 ENCOUNTER — Encounter (HOSPITAL_BASED_OUTPATIENT_CLINIC_OR_DEPARTMENT_OTHER): Payer: Self-pay | Admitting: Emergency Medicine

## 2023-05-07 ENCOUNTER — Emergency Department (HOSPITAL_BASED_OUTPATIENT_CLINIC_OR_DEPARTMENT_OTHER): Admission: EM | Admit: 2023-05-07 | Discharge: 2023-05-08 | Disposition: A | Discharge status: Home / Self Care

## 2023-05-07 ENCOUNTER — Emergency Department (HOSPITAL_BASED_OUTPATIENT_CLINIC_OR_DEPARTMENT_OTHER)

## 2023-05-07 ENCOUNTER — Ambulatory Visit: Payer: Self-pay | Admitting: Internal Medicine

## 2023-05-07 DIAGNOSIS — R5383 Other fatigue: Secondary | ICD-10-CM | POA: Insufficient documentation

## 2023-05-07 DIAGNOSIS — K2971 Gastritis, unspecified, with bleeding: Secondary | ICD-10-CM | POA: Diagnosis not present

## 2023-05-07 DIAGNOSIS — I10 Essential (primary) hypertension: Secondary | ICD-10-CM | POA: Insufficient documentation

## 2023-05-07 DIAGNOSIS — I517 Cardiomegaly: Secondary | ICD-10-CM | POA: Diagnosis not present

## 2023-05-07 DIAGNOSIS — Z79899 Other long term (current) drug therapy: Secondary | ICD-10-CM | POA: Insufficient documentation

## 2023-05-07 DIAGNOSIS — I959 Hypotension, unspecified: Secondary | ICD-10-CM | POA: Diagnosis not present

## 2023-05-07 DIAGNOSIS — G20A1 Parkinson's disease without dyskinesia, without mention of fluctuations: Secondary | ICD-10-CM | POA: Diagnosis not present

## 2023-05-07 DIAGNOSIS — D62 Acute posthemorrhagic anemia: Secondary | ICD-10-CM | POA: Diagnosis not present

## 2023-05-07 DIAGNOSIS — K921 Melena: Secondary | ICD-10-CM | POA: Diagnosis not present

## 2023-05-07 HISTORY — DX: Type 2 diabetes mellitus without complications: E11.9

## 2023-05-07 LAB — CBC
HCT: 29.8 % — ABNORMAL LOW (ref 36.0–46.0)
Hemoglobin: 9.6 g/dL — ABNORMAL LOW (ref 12.0–15.0)
MCH: 29.5 pg (ref 26.0–34.0)
MCHC: 32.2 g/dL (ref 30.0–36.0)
MCV: 91.7 fL (ref 80.0–100.0)
Platelets: 251 10*3/uL (ref 150–400)
RBC: 3.25 MIL/uL — ABNORMAL LOW (ref 3.87–5.11)
RDW: 16.8 % — ABNORMAL HIGH (ref 11.5–15.5)
WBC: 7.3 10*3/uL (ref 4.0–10.5)
nRBC: 0 % (ref 0.0–0.2)

## 2023-05-07 LAB — URINALYSIS, ROUTINE W REFLEX MICROSCOPIC
Bilirubin Urine: NEGATIVE
Glucose, UA: NEGATIVE mg/dL
Hgb urine dipstick: NEGATIVE
Ketones, ur: NEGATIVE mg/dL
Leukocytes,Ua: NEGATIVE
Nitrite: NEGATIVE
Specific Gravity, Urine: 1.023 (ref 1.005–1.030)
pH: 5.5 (ref 5.0–8.0)

## 2023-05-07 LAB — BASIC METABOLIC PANEL
Anion gap: 5 (ref 5–15)
BUN: 16 mg/dL (ref 8–23)
CO2: 33 mmol/L — ABNORMAL HIGH (ref 22–32)
Calcium: 8.9 mg/dL (ref 8.9–10.3)
Chloride: 100 mmol/L (ref 98–111)
Creatinine, Ser: 1.29 mg/dL — ABNORMAL HIGH (ref 0.44–1.00)
GFR, Estimated: 43 mL/min — ABNORMAL LOW (ref >60)
Glucose, Bld: 96 mg/dL (ref 70–99)
Potassium: 3.4 mmol/L — ABNORMAL LOW (ref 3.5–5.1)
Sodium: 138 mmol/L (ref 135–145)

## 2023-05-07 LAB — CBG MONITORING, ED: Glucose-Capillary: 91 mg/dL (ref 70–99)

## 2023-05-07 MED ORDER — LACTATED RINGERS IV BOLUS
1000.0000 mL | Freq: Once | INTRAVENOUS | Status: AC
  Administered 2023-05-07: 1000 mL via INTRAVENOUS

## 2023-05-07 NOTE — Discharge Instructions (Addendum)
 Your workup today was reassuring.  Please follow-up with your doctor.  Our list here shows that you are on furosemide (Lasix) and hydrochlorothiazide (Microzide).  You should not be on both of these as they are both diuretics and could lower your blood pressure and make you dehydrated.  Do not take either 1 of these over the weekend and then talk to your doctor Monday about what medications you should take.

## 2023-05-07 NOTE — Telephone Encounter (Signed)
 Chief Complaint: Low Blood Pressure Symptoms: Low energy, less than usual urination (has urinated in past 12 hrs per pt) Pertinent Negatives: Patient denies dizziness, lightheadedness, dry mouth  Disposition: [x] Urgent Care (no appt availability in office)   Additional Notes: Spoke with pt's home physical therapist, [name]. He states when he saw the pt last her BP was high. BP today was 88/56. Pt reports reduced energy today. Pt stands with PT while on phone and BP is 100/70. Pt states she feels weaker than normal. Pt denies dizziness or lightheadedness. This RN advised pt to go to urgent care (no appt availability in office). This RN educated pt on home care, new-worsening symptoms, when to call back/seek emergent care. Pt verbalized understanding and agrees to plan.  [name] with home PT requests a copy of pt's most recent AVS faxed to [phone]   Copied from CRM [id number]. Topic: Clinical - Red Word Triage >> May 07, 2023  5:04 PM Martha Clan wrote: Red Word that prompted transfer to Nurse Triage: Home health specialist calling regarding low BP 86/58 Fatigue, tiredness. All other vitals in normal ranges. Reason for Disposition  [1] Systolic BP 90-110 AND [2] taking blood pressure medications AND [3] dizzy, lightheaded or weak  Answer Assessment - Initial Assessment Questions BP 88/56 when taken 10 mins ago  BP 100/60, HR 70s BP taken again, 100/70 Denies hx of low blood pressure Hx of htn- takes hydrochlorothiazide; per home PT, home RN states pt struggles with taking all of her medications  Protocols used: Blood Pressure - Low-A-AH

## 2023-05-07 NOTE — ED Provider Notes (Signed)
 [place] EMERGENCY DEPARTMENT AT [facility] Provider Note   CSN: [id number] Arrival date & time: 05/07/23  1830     History  Chief Complaint  Patient presents with   Hypotension    Mallory Klein is a 78 y.o. female.  78 year old female with recent GI bleed as well as hyperlipidemia and hypertension presenting to the emergency department today with concern for low blood pressure.  The patient has recently had home health who is helping her with her medications.  She states that prior to this that she "was not the best about taking her medications".  She states that today they went to check on her.  She states that she was feeling some fatigue.  She denies any focal weakness, numbness, or tingling.  They checked her blood pressure and it was noted to be low.  The patient denies any fevers or chills.  Denies any nausea or vomiting.  She has not had any diarrhea.  She denies any blood in her stool or dark stools.  She was brought in that time for further evaluation.  The patient's blood pressures on arrival here have been normal.  She denies any current complaints other than some discomfort in her bilateral legs which is a chronic issue for her.  She denies any leg swelling.        Home Medications Prior to Admission medications   Medication Sig Start Date End Date Taking? Authorizing Provider  cetirizine (ZYRTEC) 10 MG tablet TAKE ONE TABLET BY MOUTH EVERY DAY 09/21/22   Plotnikov, Georgina Quint, MD  Cholecalciferol (VITAMIN D3) 50 MCG (2000 UT) capsule Take 1 capsule (2,000 Units total) by mouth daily. 12/21/22   Plotnikov, Georgina Quint, MD  [employer] SENNA PLUS 8.6-50 MG tablet Take 1 tablet by mouth 2 (two) times daily. 09/26/20   [provider]  Cyanocobalamin (VITAMIN B-12) 5000 MCG SUBL One SL daily 04/22/23   Plotnikov, Georgina Quint, MD  diclofenac Sodium (VOLTAREN) 1 % GEL Apply 4 g topically daily as needed (pain). 04/25/13   [provider]  docusate sodium  (COLACE) 100 MG capsule Take 100 mg by mouth at bedtime. 06/26/19   [provider]  furosemide (LASIX) 40 MG tablet Take 1 tablet (40 mg total) by mouth daily as needed. 04/28/23 04/27/24  Plotnikov, Georgina Quint, MD  hydrochlorothiazide (MICROZIDE) 12.5 MG capsule Take 1 capsule (12.5 mg total) by mouth daily. 01/19/23   Plotnikov, Georgina Quint, MD  hydrOXYzine (VISTARIL) 50 MG capsule Take 1 capsule (50 mg total) by mouth every 6 (six) hours as needed for anxiety. 10/21/22   Narda Bonds, MD  methocarbamol (ROBAXIN) 500 MG tablet Take 1 tablet (500 mg total) by mouth every 8 (eight) hours as needed for muscle spasms. 11/26/22   Plotnikov, Georgina Quint, MD  Multiple Vitamin (MULTIVITAMIN WITH MINERALS) TABS tablet Take 1 tablet by mouth daily. 10/22/22   Narda Bonds, MD  pantoprazole (PROTONIX) 40 MG tablet Take 1 tablet (40 mg total) by mouth 2 (two) times daily. 04/22/23   Plotnikov, Georgina Quint, MD  polyethylene glycol (MIRALAX / GLYCOLAX) 17 g packet Take 17 g by mouth daily as needed for mild constipation.    [provider]  potassium chloride SA (KLOR-CON M) 20 MEQ tablet Take 1 tablet (20 mEq total) by mouth daily for 3 days. 04/22/23 04/25/23  Plotnikov, Georgina Quint, MD  Pramipexole Dihydrochloride 0.75 MG TB24 Take 1 tablet (0.75 mg total) by mouth in the morning and at bedtime.  06/25/22   [name], [name], NP  QUEtiapine (SEROQUEL) 100 MG tablet TAKE ONE TABLET BY MOUTH AT BEDTIME 01/18/23   Plotnikov, Georgina Quint, MD  rosuvastatin (CRESTOR) 10 MG tablet TAKE ONE TABLET BY MOUTH EVERY DAY 12/04/22   Plotnikov, Georgina Quint, MD  solifenacin (VESICARE) 5 MG tablet TAKE ONE TABLET BY MOUTH EVERY DAY 01/18/23   Plotnikov, Georgina Quint, MD  traMADol (ULTRAM) 50 MG tablet Take 1-2 tablets (50-100 mg total) by mouth every 8 (eight) hours as needed for severe pain (pain score 7-10). 12/21/22   Plotnikov, Georgina Quint, MD  donepezil (ARICEPT) 5 MG tablet Take 1 tablet (5 mg total) by mouth at bedtime. 11/27/19 07/23/20   Plotnikov, Georgina Quint, MD  venlafaxine XR (EFFEXOR-XR) 150 MG 24 hr capsule TAKE 1 CAPSULE BY MOUTH  EVERY DAY WITH BREAKFAST 05/29/19 07/23/20  Plotnikov, Georgina Quint, MD      Allergies    Ace inhibitors, Benicar [olmesartan], Aspirin, and Citalopram hydrobromide    Review of Systems   Review of Systems  Constitutional:  Positive for fatigue.  All other systems reviewed and are negative.   Physical Exam Updated Vital Signs BP 110/70 (BP Location: Right Arm)   Pulse 70   Temp 98.4 F (36.9 C) (Oral)   Resp 18   SpO2 99%  Physical Exam Vitals and nursing note reviewed.   Gen: NAD Eyes: PERRL, EOMI HEENT: no oropharyngeal swelling Neck: trachea midline Resp: clear to auscultation bilaterally Card: RRR, no murmurs, rubs, or gallops Abd: nontender, nondistended Extremities: no calf tenderness, no edema Vascular: 2+ radial pulses bilaterally, 2+ DP pulses bilaterally Neuro: Cranial nerves intact, equal strength and sensation throughout bilateral upper and lower extremities Skin: no rashes Psyc: acting appropriately   ED Results / Procedures / Treatments   Labs (all labs ordered are listed, but only abnormal results are displayed) Labs Reviewed  BASIC METABOLIC PANEL - Abnormal; Notable for the following components:      Result Value   Potassium 3.4 (*)    CO2 33 (*)    Creatinine, Ser 1.29 (*)    GFR, Estimated 43 (*)    All other components within normal limits  CBC - Abnormal; Notable for the following components:   RBC 3.25 (*)    Hemoglobin 9.6 (*)    HCT 29.8 (*)    RDW 16.8 (*)    All other components within normal limits  URINALYSIS, ROUTINE W REFLEX MICROSCOPIC - Abnormal; Notable for the following components:   Protein, ur TRACE (*)    All other components within normal limits  CBG MONITORING, ED    EKG EKG Interpretation Date/Time:  Friday May 07 2023 18:38:57 EDT Ventricular Rate:  83 PR Interval:  140 QRS Duration:  100 QT Interval:  342 QTC  Calculation: 401 R Axis:   -73  Text Interpretation: Normal sinus rhythm Left anterior fascicular block Minimal voltage criteria for LVH, may be normal variant ( Cornell product ) Septal infarct , age undetermined Abnormal ECG When compared with ECG of 17-Oct-2022 11:29, PREVIOUS ECG IS PRESENT Confirmed by Beckey Downing [id number]) on 05/07/2023 6:51:31 PM  Radiology No results found.  Procedures Procedures    Medications Ordered in ED Medications  lactated ringers bolus 1,000 mL (0 mLs Intravenous Stopped 05/07/23 2341)    ED Course/ Medical Decision Making/ A&P  Medical Decision Making 78 year old female with past medical history of hypertension and hyperlipidemia presenting to the emergency department today with concern for low or low blood pressure at home with some fatigue.  The patient does not have any focal neurological findings here on exam.  Her vital signs have been stable here.  I give patient some IV fluids.  Will obtain basic labs here to evaluate for anemia or electrolyte abnormalities.  Also obtain a chest x-ray and urinalysis to eval for possible infectious causes but the patient is afebrile here with reassuring vital signs so suspicion is relatively low.  It does appear that the patient is on 2 blood pressure medications as well as Lasix as needed.  She did state that she was not the most compliant with her medications and does have home health that has been helping her take her medications recently so it is possible that this may be iatrogenic if she is actually getting all of her medications if she was not taking these before.  I will reevaluate for ultimate disposition.  Amount and/or Complexity of Data Reviewed Labs: ordered. Radiology: ordered.           Final Clinical Impression(s) / ED Diagnoses Final diagnoses:  Other fatigue  Hypotension, unspecified hypotension type    Rx / DC Orders ED Discharge Orders     None          Durwin Glaze, MD 05/10/23 1701

## 2023-05-07 NOTE — ED Triage Notes (Signed)
 Pt bib wheelchair, c/o hypotension, dizziness, and low energy, fatigue for a few days. Referred by home health RN . 80/60 was last recorded b/p at home pta

## 2023-05-08 NOTE — ED Notes (Signed)
 Pt accidentally got IV caught on blanket while sleeping and pulled IV out of right hand. IV site cleaned up and RN notified.

## 2023-05-10 ENCOUNTER — Telehealth: Payer: Self-pay | Admitting: Internal Medicine

## 2023-05-10 ENCOUNTER — Ambulatory Visit: Payer: Self-pay | Admitting: Licensed Clinical Social Worker

## 2023-05-10 DIAGNOSIS — K2971 Gastritis, unspecified, with bleeding: Secondary | ICD-10-CM | POA: Diagnosis not present

## 2023-05-10 DIAGNOSIS — D62 Acute posthemorrhagic anemia: Secondary | ICD-10-CM | POA: Diagnosis not present

## 2023-05-10 DIAGNOSIS — K921 Melena: Secondary | ICD-10-CM | POA: Diagnosis not present

## 2023-05-10 DIAGNOSIS — G20A1 Parkinson's disease without dyskinesia, without mention of fluctuations: Secondary | ICD-10-CM | POA: Diagnosis not present

## 2023-05-10 NOTE — Telephone Encounter (Signed)
 Copied from CRM [id number]. Topic: General - Other >> May 10, 2023  1:55 PM Mallory Klein wrote: Reason for CRM: [name] Nurse  Blood pressure 180/100  Asympotmatic Additonal increase confusion - getting days confused  Still waiting to hear back on what medication to be taken can be faxed to  [phone] - [name]

## 2023-05-10 NOTE — Telephone Encounter (Unsigned)
 Copied from CRM [id number]. Topic: Referral - Question >> May 10, 2023 11:16 AM Theodis Sato wrote: Reason for CRM: Sue Lush a Child psychotherapist with [employer] is seeking clarification from Dr. Posey Rea or nurse on the referral that was sent to [employer] . Sue Lush states that the referral just states the patients diagnoses but not what the provider wants, please call Sue Lush to provide clarification at : [phone]

## 2023-05-10 NOTE — Patient Outreach (Signed)
 Care Coordination   Follow Up Visit Note   05/10/2023 Name: Mallory Klein MRN: [id number] DOB: 1945-12-22  Mallory Klein is a 78 y.o. year old female who sees Plotnikov, Georgina Quint, MD for primary care. I spoke with  Mallory Klein by phone today.  What matters to the patients health and wellness today?  Follow up completed with Mallory Klein. Pt reports she is having a difficult time managing her medication dosage and frequency.     Goals Addressed   None     SDOH assessments and interventions completed:  Yes  SDOH Interventions Today    Flowsheet Row Most Recent Value  SDOH Interventions   Food Insecurity Interventions Intervention Not Indicated  Housing Interventions Intervention Not Indicated  Transportation Interventions Intervention Not Indicated  Utilities Interventions Intervention Not Indicated        Care Coordination Interventions:  Yes, provided   Interventions Today    Flowsheet Row Most Recent Value  Chronic Disease   Chronic disease during today's visit Hypertension (HTN)  [Pt is requesting assistance w managing medicine and hypertension]  Mental Health Interventions   Mental Health Discussed/Reviewed Mental Health Reviewed  [Patient denies symptoms of depressed mood. Pt is overwhelmed and needs assistance with mgr medication .]  Pharmacy Interventions   Pharmacy Dicussed/Reviewed Medication Adherence  Medication Adherence --  [pt overwhelmed with amount of medication]       Follow up plan: Follow up call scheduled for 05/24/2023    Encounter Outcome:  Patient Visit Completed   Gwyndolyn Saxon MSW, LCSW Licensed Clinical Social Worker  [facility], Population Health Direct Dial: [phone]  Fax: [phone]

## 2023-05-10 NOTE — Patient Instructions (Signed)
 Visit Information  Thank you for taking time to visit with me today. Please don't hesitate to contact me if I can be of assistance to you.   Following are the goals we discussed today:   Goals Addressed   None     Our next appointment is by telephone on 05/24/2023 at 1030  Please call the care guide team at [phone] if you need to cancel or reschedule your appointment.   If you are experiencing a Mental Health or Behavioral Health Crisis or need someone to talk to, please call 911   Patient verbalizes understanding of instructions and care plan provided today and agrees to view in MyChart. Active MyChart status and patient understanding of how to access instructions and care plan via MyChart confirmed with patient.     Telephone follow up appointment with care management team member scheduled for:05/24/2023  Gwyndolyn Saxon MSW, LCSW Licensed Clinical Social Worker  [facility], Population Health Direct Dial: [phone]  Fax: [phone]

## 2023-05-11 ENCOUNTER — Encounter: Payer: Self-pay | Admitting: Emergency Medicine

## 2023-05-11 ENCOUNTER — Ambulatory Visit (INDEPENDENT_AMBULATORY_CARE_PROVIDER_SITE_OTHER): Admitting: Emergency Medicine

## 2023-05-11 VITALS — BP 128/78 | HR 83 | Temp 98.4°F | Ht 61.5 in | Wt 146.0 lb

## 2023-05-11 DIAGNOSIS — Z09 Encounter for follow-up examination after completed treatment for conditions other than malignant neoplasm: Secondary | ICD-10-CM | POA: Diagnosis not present

## 2023-05-11 DIAGNOSIS — K219 Gastro-esophageal reflux disease without esophagitis: Secondary | ICD-10-CM | POA: Diagnosis not present

## 2023-05-11 DIAGNOSIS — I10 Essential (primary) hypertension: Secondary | ICD-10-CM | POA: Diagnosis not present

## 2023-05-11 DIAGNOSIS — G3184 Mild cognitive impairment, so stated: Secondary | ICD-10-CM

## 2023-05-11 DIAGNOSIS — N183 Chronic kidney disease, stage 3 unspecified: Secondary | ICD-10-CM

## 2023-05-11 MED ORDER — AMLODIPINE BESYLATE 5 MG PO TABS: 5.0000 mg | ORAL_TABLET | Freq: Every day | ORAL | 3 refills | Status: DC

## 2023-05-11 NOTE — Assessment & Plan Note (Signed)
 Advised to stay well-hydrated and avoid NSAIDs is much as possible

## 2023-05-11 NOTE — Assessment & Plan Note (Signed)
 Stable without concerns Cont w/Sinemet CR, donepezil

## 2023-05-11 NOTE — Assessment & Plan Note (Signed)
 BP Readings from Last 3 Encounters:  05/11/23 128/78  05/08/23 110/70  04/22/23 (!) 140/86  Well-controlled hypertension Continues amlodipine 5 mg daily and hydrochlorothiazide 12.5 mg daily

## 2023-05-11 NOTE — Progress Notes (Signed)
 Mallory Klein 78 y.o.   Chief Complaint  Patient presents with   Hospitalization Follow-up    Patient went to the ED 05/07/23 she went for fatigue/Hypotension. She states she ended up staying longer than expected because a GI bleed. Patient states she feels okay today and states her memory has been effected    HISTORY OF PRESENT ILLNESS: This is a 78 y.o. female here for follow-up of emergency department visit on 05/07/2023 when she presented complaining of fatigue and was found to be hypotensive. Workup was negative.  She also had upper and lower endoscopies on 04/15/2023.  Reports reviewed. Patient is doing well.  Accompanied by husband.  She has no complaints or any other medical concerns today. BP Readings from Last 3 Encounters:  05/08/23 110/70  04/22/23 (!) 140/86  04/16/23 129/65     HPI   Prior to Admission medications   Medication Sig Start Date End Date Taking? Authorizing Provider  amLODipine (NORVASC) 5 MG tablet Take 1 tablet (5 mg total) by mouth daily. 05/11/23   Plotnikov, Georgina Quint, MD  cetirizine (ZYRTEC) 10 MG tablet TAKE ONE TABLET BY MOUTH EVERY DAY 09/21/22   Plotnikov, Georgina Quint, MD  Cholecalciferol (VITAMIN D3) 50 MCG (2000 UT) capsule Take 1 capsule (2,000 Units total) by mouth daily. 12/21/22   Plotnikov, Georgina Quint, MD  [facility] SENNA PLUS 8.6-50 MG tablet Take 1 tablet by mouth 2 (two) times daily. 09/26/20   [provider]  Cyanocobalamin (VITAMIN B-12) 5000 MCG SUBL One SL daily 04/22/23   Plotnikov, Georgina Quint, MD  diclofenac Sodium (VOLTAREN) 1 % GEL Apply 4 g topically daily as needed (pain). 04/25/13   [provider]  docusate sodium (COLACE) 100 MG capsule Take 100 mg by mouth at bedtime. 06/26/19   [provider]  furosemide (LASIX) 40 MG tablet Take 1 tablet (40 mg total) by mouth daily as needed. 04/28/23 04/27/24  Plotnikov, Georgina Quint, MD  hydrochlorothiazide (MICROZIDE) 12.5 MG capsule Take 1 capsule (12.5 mg total) by mouth daily.  01/19/23   Plotnikov, Georgina Quint, MD  hydrOXYzine (VISTARIL) 50 MG capsule Take 1 capsule (50 mg total) by mouth every 6 (six) hours as needed for anxiety. 10/21/22   Narda Bonds, MD  methocarbamol (ROBAXIN) 500 MG tablet Take 1 tablet (500 mg total) by mouth every 8 (eight) hours as needed for muscle spasms. 11/26/22   Plotnikov, Georgina Quint, MD  Multiple Vitamin (MULTIVITAMIN WITH MINERALS) TABS tablet Take 1 tablet by mouth daily. 10/22/22   Narda Bonds, MD  pantoprazole (PROTONIX) 40 MG tablet Take 1 tablet (40 mg total) by mouth 2 (two) times daily. 04/22/23   Plotnikov, Georgina Quint, MD  polyethylene glycol (MIRALAX / GLYCOLAX) 17 g packet Take 17 g by mouth daily as needed for mild constipation.    [provider]  potassium chloride SA (KLOR-CON M) 20 MEQ tablet Take 1 tablet (20 mEq total) by mouth daily for 3 days. 04/22/23 04/25/23  Plotnikov, Georgina Quint, MD  Pramipexole Dihydrochloride 0.75 MG TB24 Take 1 tablet (0.75 mg total) by mouth in the morning and at bedtime. 06/25/22   [name], [name], NP  QUEtiapine (SEROQUEL) 100 MG tablet TAKE ONE TABLET BY MOUTH AT BEDTIME 01/18/23   Plotnikov, Georgina Quint, MD  rosuvastatin (CRESTOR) 10 MG tablet TAKE ONE TABLET BY MOUTH EVERY DAY 12/04/22   Plotnikov, Georgina Quint, MD  solifenacin (VESICARE) 5 MG tablet TAKE ONE TABLET BY MOUTH EVERY DAY 01/18/23   Plotnikov, [employer]  V, MD  traMADol (ULTRAM) 50 MG tablet Take 1-2 tablets (50-100 mg total) by mouth every 8 (eight) hours as needed for severe pain (pain score 7-10). 12/21/22   Plotnikov, Georgina Quint, MD  donepezil (ARICEPT) 5 MG tablet Take 1 tablet (5 mg total) by mouth at bedtime. 11/27/19 07/23/20  Plotnikov, Georgina Quint, MD  venlafaxine XR (EFFEXOR-XR) 150 MG 24 hr capsule TAKE 1 CAPSULE BY MOUTH  EVERY DAY WITH BREAKFAST 05/29/19 07/23/20  Plotnikov, Georgina Quint, MD    Allergies  Allergen Reactions   Ace Inhibitors     Patient doesn't recall reaction.  jkl   Benicar [Olmesartan]     It made her sick    Aspirin Other (See Comments)    bruising   Citalopram Hydrobromide Diarrhea    Patient Active Problem List   Diagnosis Date Noted   Melena 04/13/2023   Hematochezia 04/13/2023   Symptomatic anemia 04/12/2023   Acute upper GI bleed 04/12/2023   Malnutrition of moderate degree 10/20/2022   Abnormal CT of the abdomen 10/19/2022   Gastric ulcer 10/17/2022   Peptic ulcer disease 10/17/2022   Leukocytosis 10/17/2022   Anemia 10/17/2022   Gastritis 09/17/2022   Abdominal pain 09/17/2022   Constipation 09/07/2022   CRI (chronic renal insufficiency), stage 3 (moderate) (HCC) 06/30/2022   Anxiety 10/05/2021   OAB (overactive bladder) 10/05/2021   Gait disorder 10/03/2021   Non compliance w medication regimen 09/25/2021   Hives 09/24/2021   Complete tear of right rotator cuff 08/22/2020   Fall 07/31/2020   Concussion 07/31/2020   Anosmia 05/02/2020   Grief reaction 03/27/2020   Memory loss 12/13/2019   Food poisoning 09/11/2019   B12 deficiency 06/14/2019   Mild neurocognitive disorder, likely due to Parkinson's disease 02/09/2019   Vitamin D deficiency 01/26/2019   Sweating profusely 12/08/2018   Insomnia disorder 12/08/2018   Cerumen impaction 10/19/2018   Lumbar facet arthropathy 02/10/2018   Iron deficiency anemia 04/23/2017   Cough 05/12/2016   Pseudophakia of both eyes 10/09/2015   Abnormal CBC 02/05/2015   Edema 10/15/2014   S/P lumbar laminectomy 02/20/2014   Pre-ulcerative calluses 2014-03-06   Parkinson disease (HCC) 03/06/2014   Cold sore 10/17/2013   Low back pain 07/25/2013   Nuclear sclerosis 02/08/2013   Heel ulcer (HCC) 03/08/2012   Rash 12/04/2011   Contracture of right knee 10/07/2011   Neck pain on left side 08/06/2011   Presence of unspecified artificial knee joint 04/22/2011   Hyperglycemia 03/02/2011   Acquired spondylolisthesis 12/25/2010   Degeneration of lumbosacral intervertebral disc 12/25/2010   Spinal stenosis of lumbar region without  neurogenic claudication 12/25/2010   Arthritis of right knee 12/03/2010   Knee pain, right 09/08/2010   OSA (obstructive sleep apnea) 02/14/2010   BRONCHITIS, ACUTE 04/05/2009   Vertigo 12/02/2007   Otitis media 10/26/2007   Other malaise and fatigue 05/11/2007   Adjustment disorder with mixed anxiety and depressed mood 02/15/2007   GERD (gastroesophageal reflux disease) 02/15/2007   Osteoarthritis 02/15/2007   Postherpetic trigeminal neuralgia 01/26/2007   Herpes zoster 01/26/2007   Morbid obesity 01/26/2007   Migraine headaches 01/26/2007   Essential hypertension 01/26/2007   Asthma 01/26/2007   Urinary incontinence 01/26/2007    Past Medical History:  Diagnosis Date   Abnormal CBC 02/05/2015   Dr Mosetta Putt 12/16 new: normocytic anemia and thrombocytosis   Adjustment disorder with mixed anxiety and depressed mood 02/15/2007   Chronic  Chronic pain Grief, stress Effexor XR  Dad died in March 06, 2024  Asthma    Diabetes mellitus without complication (HCC)    Diverticulosis of colon (without mention of hemorrhage)    Esophageal stricture    Essential hypertension 01/26/2007   Chronic Verapamil    Falls    "I blackout"   GERD (gastroesophageal reflux disease) 02/15/2007   Chronic     Heart murmur    Hyperglycemia 03/02/2011   Mild     Insomnia disorder 12/08/2018   10/20 Carbid/Lev dose was increased - c/o hard time falling asleep (6 am) getting up at 12 am, poor sleep. Try Temazepam 15-30 mg at 11-1:30 pm    Internal hemorrhoids without mention of complication    Iron deficiency anemia 04/23/2017   Mild neurocognitive disorder, likely due to Parkinson's disease 02/09/2019   OSA (obstructive sleep apnea) 02/14/2010   In lab study (May 2018): AHI 25. Only had 45 minutes of sleep secondary to frequent awakenings secondary to sleep apnea. autocpap 5-15 cm water.    Osteoarthritis    Parkinson disease (HCC) 02/09/2014   2015 2017  Primidone - d/c 2019 Parkinson's: Sinmet IR. Dr Tat    Postherpetic trigeminal neuralgia 01/26/2007   Qualifier: Diagnosis of  By: Charlsie Quest RMA, [name]     Shortness of breath dyspnea    with allergies   Shoulder contusion 07/31/2020   severe R shoulder pain after a fall on 07/20/20, neck pain. There was a LOC. Pt went to ER 4 timeas. All CTs,X rays were (-)     Sleep apnea    Syncope and collapse 08/06/2020   Remote syncope on 07/20/2020.  Possible recurrent.  Cardiology consultation     Thrombocytosis 05/24/2017   Urinary incontinence 01/26/2007   Chronic  10/17 [place]    Vitamin D deficiency 01/26/2019    Past Surgical History:  Procedure Laterality Date   ABDOMINAL HYSTERECTOMY     BIOPSY  10/19/2022   Procedure: BIOPSY;  Surgeon: Hilarie Fredrickson, MD;  Location: [facility] ENDOSCOPY;  Service: Gastroenterology;;   BREAST CYST EXCISION Left 10/25/1978   COLONOSCOPY     Multiple   COLONOSCOPY WITH PROPOFOL N/A 04/15/2023   Procedure: COLONOSCOPY WITH PROPOFOL;  Surgeon: Imogene Burn, MD;  Location: [facility] ENDOSCOPY;  Service: Gastroenterology;  Laterality: N/A;   ESOPHAGOGASTRODUODENOSCOPY (EGD) WITH PROPOFOL N/A 10/19/2022   Procedure: ESOPHAGOGASTRODUODENOSCOPY (EGD) WITH PROPOFOL;  Surgeon: Hilarie Fredrickson, MD;  Location: [facility] ENDOSCOPY;  Service: Gastroenterology;  Laterality: N/A;   ESOPHAGOGASTRODUODENOSCOPY (EGD) WITH PROPOFOL N/A 04/15/2023   Procedure: ESOPHAGOGASTRODUODENOSCOPY (EGD) WITH PROPOFOL;  Surgeon: Imogene Burn, MD;  [facility] END[facility]COPY;  Service: Gastroenterology;  Laterality: N/A;   HOT HEMOSTASIS N/A 04/15/2023   Procedure: HOT HEMOSTASIS (ARGON PLASMA COAGULATION/BICAP);  Surgeon: Imogene Burn, MD;  Location: [facility] ENDOSCOPY;  Service: Gastroenterology;  Laterality: N/A;   LUMBAR LAMINECTOMY/DECOMPRESSION MICRODISCECTOMY Bilateral 02/20/2014   Procedure: LUMBAR TWO THREE, LUMBAR THREE FOUR LUMBAR LAMINECTOMY/DECOMPRESSION MICRODISCECTOMY 2 LEVELS;  Surgeon: Tia Alert, MD;  Location: [facility] NEURO ORS;  Service: Neurosurgery;   Laterality: Bilateral;   SPLENECTOMY     TIBIA FRACTURE SURGERY Right    TIBIA FRACTURE SURGERY Left    TOTAL KNEE ARTHROPLASTY Right 02/24/2011    Social History   Socioeconomic History   Marital status: Married    Spouse name: Not on file   Number of children: 2   Years of education: 14   Highest education level: Associate degree: occupational, Scientist, product/process development, or vocational program  Occupational History   Occupation: Retired    Comment: Diplomatic Services operational officer, Corporate treasurer  assist   Tobacco Use   Smoking status: Never   Smokeless tobacco: Never  Vaping Use   Vaping status: Never Used  Substance and Sexual Activity   Alcohol use: Yes    Comment: wine on occ    Drug use: No   Sexual activity: Not Currently  Other Topics Concern   Not on file  Social History Narrative   Drink tea and coffee on occ   Right handed   1 story    Lives with spouse   Social Drivers of Health   Financial Resource Strain: Low Risk  (06/24/2022)   Overall Financial Resource Strain (CARDIA)    Difficulty of Paying Living Expenses: Not hard at all  Food Insecurity: No Food Insecurity (05/10/2023)   Hunger Vital Sign    Worried About Running Out of Food in the Last Year: Never true    Ran Out of Food in the Last Year: Never true  Transportation Needs: No Transportation Needs (05/10/2023)   PRAPARE - Administrator, Civil Service (Medical): No    Lack of Transportation (Non-Medical): No  Physical Activity: Inactive (06/24/2022)   Exercise Vital Sign    Days of Exercise per Week: 0 days    Minutes of Exercise per Session: 0 min  Stress: No Stress Concern Present (06/24/2022)   [employer] of Occupational Health - Occupational Stress Questionnaire    Feeling of Stress : Not at all  Social Connections: Socially Integrated (04/12/2023)   Social Connection and Isolation Panel [NHANES]    Frequency of Communication with Friends and Family: Twice a week    Frequency of Social Gatherings with Friends and Family:  Once a week    Attends Religious Services: More than 4 times per year    Active Member of [employer] or Organizations: Yes    Attends Banker Meetings: 1 to 4 times per year    Marital Status: Married  Catering manager Violence: Not At Risk (05/10/2023)   Humiliation, Afraid, Rape, and Kick questionnaire    Fear of Current or Ex-Partner: No    Emotionally Abused: No    Physically Abused: No    Sexually Abused: No    Family History  Problem Relation Age of Onset   Hypertension Mother    Diabetes type II Mother    Asthma Mother    Diabetes Mother    Dementia Mother    Prostate cancer Father    Mental illness Father        dementia   Stroke Brother        died in prison   Suicidality Son    Colon cancer Neg Hx    Rectal cancer Neg Hx    Parkinson's disease Neg Hx    Colon polyps Neg Hx    Esophageal cancer Neg Hx    Stomach cancer Neg Hx    Ulcerative colitis Neg Hx      Review of Systems  Constitutional: Negative.  Negative for chills and fever.  HENT: Negative.  Negative for congestion and sore throat.   Respiratory: Negative.  Negative for cough and shortness of breath.   Cardiovascular: Negative.  Negative for chest pain.  Gastrointestinal:  Negative for abdominal pain, diarrhea, nausea and vomiting.  Genitourinary: Negative.  Negative for dysuria and hematuria.  Skin: Negative.  Negative for rash.  Neurological: Negative.  Negative for dizziness and headaches.  All other systems reviewed and are negative.   Today's Vitals   05/11/23 1436  BP: 128/78  Pulse: 83  Temp: 98.4 F (36.9 C)  TempSrc: Oral  SpO2: 93%  Weight: 146 lb (66.2 kg)  Height: 5' 1.5" (1.562 m)   Body mass index is 27.14 kg/m.   Physical Exam Vitals reviewed.  Constitutional:      Appearance: Normal appearance.  HENT:     Head: Normocephalic.     Mouth/Throat:     Mouth: Mucous membranes are moist.     Pharynx: Oropharynx is clear.  Eyes:     Extraocular Movements:  Extraocular movements intact.     Conjunctiva/sclera: Conjunctivae normal.     Pupils: Pupils are equal, round, and reactive to light.  Cardiovascular:     Rate and Rhythm: Normal rate and regular rhythm.     Pulses: Normal pulses.     Heart sounds: Normal heart sounds.  Pulmonary:     Effort: Pulmonary effort is normal.     Breath sounds: Normal breath sounds.  Abdominal:     Palpations: Abdomen is soft.     Tenderness: There is no abdominal tenderness.  Musculoskeletal:     Cervical back: No tenderness.  Lymphadenopathy:     Cervical: No cervical adenopathy.  Skin:    General: Skin is warm and dry.     Capillary Refill: Capillary refill takes less than 2 seconds.  Neurological:     General: No focal deficit present.     Mental Status: She is alert and oriented to person, place, and time.  Psychiatric:        Mood and Affect: Mood normal.        Behavior: Behavior normal.      ASSESSMENT & PLAN: A total of 46 minutes was spent with the patient and counseling/coordination of care regarding preparing for this visit, review of most recent office visit notes, review of most recent emergency department visit notes, review of most recent blood work results, review of most recent upper and lower endoscopy reports, review of multiple chronic medical conditions under management, review of all medications, prognosis, documentation and need for follow-up with PCP.  Problem List Items Addressed This Visit       Cardiovascular and Mediastinum   Essential hypertension - Primary   BP Readings from Last 3 Encounters:  05/11/23 128/78  05/08/23 110/70  04/22/23 (!) 140/86  Well-controlled hypertension Continues amlodipine 5 mg daily and hydrochlorothiazide 12.5 mg daily         Digestive   GERD (gastroesophageal reflux disease)   Stay well and well-controlled.  Asymptomatic. History of recent gastritis and upper GI bleed Scheduled to follow-up with Dr. Marina Goodell next week Continues  pantoprazole 40 mg twice a day        Nervous and Auditory   Mild neurocognitive disorder, likely due to Parkinson's disease   Stable without concerns Cont w/Sinemet CR, donepezil        Genitourinary   CRI (chronic renal insufficiency), stage 3 (moderate) (HCC)   Advised to stay well-hydrated and avoid NSAIDs is much as possible      Other Visit Diagnoses       Hospital discharge follow-up          Patient Instructions  Health Maintenance After Age 39 After age 73, you are at a higher risk for certain long-term diseases and infections as well as injuries from falls. Falls are a major cause of broken bones and head injuries in people who are older than age 20. Getting regular preventive care can help to keep  you healthy and well. Preventive care includes getting regular testing and making lifestyle changes as recommended by your health care provider. Talk with your health care provider about: Which screenings and tests you should have. A screening is a test that checks for a disease when you have no symptoms. A diet and exercise plan that is right for you. What should I know about screenings and tests to prevent falls? Screening and testing are the best ways to find a health problem early. Early diagnosis and treatment give you the best chance of managing medical conditions that are common after age 71. Certain conditions and lifestyle choices may make you more likely to have a fall. Your health care provider may recommend: Regular vision checks. Poor vision and conditions such as cataracts can make you more likely to have a fall. If you wear glasses, make sure to get your prescription updated if your vision changes. Medicine review. Work with your health care provider to regularly review all of the medicines you are taking, including over-the-counter medicines. Ask your health care provider about any side effects that may make you more likely to have a fall. Tell your health care  provider if any medicines that you take make you feel dizzy or sleepy. Strength and balance checks. Your health care provider may recommend certain tests to check your strength and balance while standing, walking, or changing positions. Foot health exam. Foot pain and numbness, as well as not wearing proper footwear, can make you more likely to have a fall. Screenings, including: Osteoporosis screening. Osteoporosis is a condition that causes the bones to get weaker and break more easily. Blood pressure screening. Blood pressure changes and medicines to control blood pressure can make you feel dizzy. Depression screening. You may be more likely to have a fall if you have a fear of falling, feel depressed, or feel unable to do activities that you used to do. Alcohol use screening. Using too much alcohol can affect your balance and may make you more likely to have a fall. Follow these instructions at home: Lifestyle Do not drink alcohol if: Your health care provider tells you not to drink. If you drink alcohol: Limit how much you have to: 0-1 drink a day for women. 0-2 drinks a day for men. Know how much alcohol is in your drink. In the [place] one drink equals one 12 oz bottle of beer (355 mL), one 5 oz glass of wine (148 mL), or one 1 oz glass of hard liquor (44 mL). Do not use any products that contain nicotine or tobacco. These products include cigarettes, chewing tobacco, and vaping devices, such as e-cigarettes. If you need help quitting, ask your health care provider. Activity  Follow a regular exercise program to stay fit. This will help you maintain your balance. Ask your health care provider what types of exercise are appropriate for you. If you need a cane or walker, use it as recommended by your health care provider. Wear supportive shoes that have nonskid soles. Safety  Remove any tripping hazards, such as rugs, cords, and clutter. Install safety equipment such as grab bars in  bathrooms and safety rails on stairs. Keep rooms and walkways well-lit. General instructions Talk with your health care provider about your risks for falling. Tell your health care provider if: You fall. Be sure to tell your health care provider about all falls, even ones that seem minor. You feel dizzy, tiredness (fatigue), or off-balance. Take over-the-counter and prescription medicines only as  told by your health care provider. These include supplements. Eat a healthy diet and maintain a healthy weight. A healthy diet includes low-fat dairy products, low-fat (lean) meats, and fiber from whole grains, beans, and lots of fruits and vegetables. Stay current with your vaccines. Schedule regular health, dental, and eye exams. Summary Having a healthy lifestyle and getting preventive care can help to protect your health and wellness after age 48. Screening and testing are the best way to find a health problem early and help you avoid having a fall. Early diagnosis and treatment give you the best chance for managing medical conditions that are more common for people who are older than age 85. Falls are a major cause of broken bones and head injuries in people who are older than age 52. Take precautions to prevent a fall at home. Work with your health care provider to learn what changes you can make to improve your health and wellness and to prevent falls. This information is not intended to replace advice given to you by your health care provider. Make sure you discuss any questions you have with your health care provider. Document Revised: 07/01/2020 Document Reviewed: 07/01/2020 Elsevier Patient Education  2024 Elsevier Inc.    Edwina Barth, MD [facility]

## 2023-05-11 NOTE — Telephone Encounter (Signed)
 Pt is seeing Dr. Alvy Bimler as a hospital f/u today 05/11/2023.  Provider has stated the following "Noted.  For elevated blood pressure, please start amlodipine 5 mg daily-prescription was emailed to the pharmacy.   If confused, reduce or discontinue tramadol. Use Tylenol for pain instead.   Thank you"

## 2023-05-11 NOTE — Patient Instructions (Signed)
 Health Maintenance After Age 79 After age 4, you are at a higher risk for certain long-term diseases and infections as well as injuries from falls. Falls are a major cause of broken bones and head injuries in people who are older than age 47. Getting regular preventive care can help to keep you healthy and well. Preventive care includes getting regular testing and making lifestyle changes as recommended by your health care provider. Talk with your health care provider about: Which screenings and tests you should have. A screening is a test that checks for a disease when you have no symptoms. A diet and exercise plan that is right for you. What should I know about screenings and tests to prevent falls? Screening and testing are the best ways to find a health problem early. Early diagnosis and treatment give you the best chance of managing medical conditions that are common after age 37. Certain conditions and lifestyle choices may make you more likely to have a fall. Your health care provider may recommend: Regular vision checks. Poor vision and conditions such as cataracts can make you more likely to have a fall. If you wear glasses, make sure to get your prescription updated if your vision changes. Medicine review. Work with your health care provider to regularly review all of the medicines you are taking, including over-the-counter medicines. Ask your health care provider about any side effects that may make you more likely to have a fall. Tell your health care provider if any medicines that you take make you feel dizzy or sleepy. Strength and balance checks. Your health care provider may recommend certain tests to check your strength and balance while standing, walking, or changing positions. Foot health exam. Foot pain and numbness, as well as not wearing proper footwear, can make you more likely to have a fall. Screenings, including: Osteoporosis screening. Osteoporosis is a condition that causes  the bones to get weaker and break more easily. Blood pressure screening. Blood pressure changes and medicines to control blood pressure can make you feel dizzy. Depression screening. You may be more likely to have a fall if you have a fear of falling, feel depressed, or feel unable to do activities that you used to do. Alcohol use screening. Using too much alcohol can affect your balance and may make you more likely to have a fall. Follow these instructions at home: Lifestyle Do not drink alcohol if: Your health care provider tells you not to drink. If you drink alcohol: Limit how much you have to: 0-1 drink a day for women. 0-2 drinks a day for men. Know how much alcohol is in your drink. In the [place] one drink equals one 12 oz bottle of beer (355 mL), one 5 oz glass of wine (148 mL), or one 1 oz glass of hard liquor (44 mL). Do not use any products that contain nicotine or tobacco. These products include cigarettes, chewing tobacco, and vaping devices, such as e-cigarettes. If you need help quitting, ask your health care provider. Activity  Follow a regular exercise program to stay fit. This will help you maintain your balance. Ask your health care provider what types of exercise are appropriate for you. If you need a cane or walker, use it as recommended by your health care provider. Wear supportive shoes that have nonskid soles. Safety  Remove any tripping hazards, such as rugs, cords, and clutter. Install safety equipment such as grab bars in bathrooms and safety rails on stairs. Keep rooms and walkways  well-lit. General instructions Talk with your health care provider about your risks for falling. Tell your health care provider if: You fall. Be sure to tell your health care provider about all falls, even ones that seem minor. You feel dizzy, tiredness (fatigue), or off-balance. Take over-the-counter and prescription medicines only as told by your health care provider. These include  supplements. Eat a healthy diet and maintain a healthy weight. A healthy diet includes low-fat dairy products, low-fat (lean) meats, and fiber from whole grains, beans, and lots of fruits and vegetables. Stay current with your vaccines. Schedule regular health, dental, and eye exams. Summary Having a healthy lifestyle and getting preventive care can help to protect your health and wellness after age 11. Screening and testing are the best way to find a health problem early and help you avoid having a fall. Early diagnosis and treatment give you the best chance for managing medical conditions that are more common for people who are older than age 28. Falls are a major cause of broken bones and head injuries in people who are older than age 48. Take precautions to prevent a fall at home. Work with your health care provider to learn what changes you can make to improve your health and wellness and to prevent falls. This information is not intended to replace advice given to you by your health care provider. Make sure you discuss any questions you have with your health care provider. Document Revised: 07/01/2020 Document Reviewed: 07/01/2020 Elsevier Patient Education  2024 [employer].

## 2023-05-11 NOTE — Assessment & Plan Note (Signed)
 Stay well and well-controlled.  Asymptomatic. History of recent gastritis and upper GI bleed Scheduled to follow-up with Dr. Marina Goodell next week Continues pantoprazole 40 mg twice a day

## 2023-05-11 NOTE — Telephone Encounter (Signed)
 Noted.  For elevated blood pressure, please start amlodipine 5 mg daily-prescription was emailed to the pharmacy.  If confused, reduce or discontinue tramadol. Use Tylenol for pain instead.  Thank you

## 2023-05-12 NOTE — Telephone Encounter (Signed)
 [facility] called in requesting an order for home health certification and evaluation. She states the paperwork has been faxed and she is waiting to receive it back

## 2023-05-13 ENCOUNTER — Telehealth: Payer: Self-pay | Admitting: *Deleted

## 2023-05-13 NOTE — Progress Notes (Signed)
 Care Guide Pharmacy Note  05/13/2023 Name: Mallory Klein MRN: [id number] DOB: 06/10/1945  Referred By: Tresa Garter, MD Reason for referral: Care Coordination (Outreach to schedule initial with pharmacist )   Mallory Klein is a 78 y.o. year old female who is a primary care patient of Plotnikov, Georgina Quint, MD.  [name] was referred to the pharmacist for assistance related to: HTN  Successful contact was made with the patient to discuss pharmacy services including being ready for the pharmacist to call at least 5 minutes before the scheduled appointment time and to have medication bottles and any blood pressure readings ready for review. The patient agreed to meet with the pharmacist via telephone visit on 06/01/2023  Burman Nieves, CMA, Care Guide [facility], [facility] Guide Direct Dial: [phone]  Fax: [phone] Website: [email or web address]

## 2023-05-18 ENCOUNTER — Telehealth: Payer: Self-pay | Admitting: Internal Medicine

## 2023-05-18 DIAGNOSIS — K2971 Gastritis, unspecified, with bleeding: Secondary | ICD-10-CM | POA: Diagnosis not present

## 2023-05-18 DIAGNOSIS — K921 Melena: Secondary | ICD-10-CM | POA: Diagnosis not present

## 2023-05-18 DIAGNOSIS — D62 Acute posthemorrhagic anemia: Secondary | ICD-10-CM | POA: Diagnosis not present

## 2023-05-18 DIAGNOSIS — G20A1 Parkinson's disease without dyskinesia, without mention of fluctuations: Secondary | ICD-10-CM | POA: Diagnosis not present

## 2023-05-18 NOTE — Telephone Encounter (Signed)
 Copied from CRM [id number]. Topic: General - Other >> May 18, 2023  2:09 PM Almira Coaster wrote: Reason for CRM: [name] a PT from [facility] is calling to advise, Patient woke up with a headache behind her right ear, pain is 5 out of ten doesn't know what's causing it. Blood pressure elevated beginning of the visit 140/90 and 120/80 after the visit. Patient has a medication bubble pack and not taking as instructed, she reports memory is worse than it was 3-4 weeks ago as an example of this she went to a doctors appointment yesterday but the appointment was not until 06/07/2023. She lives with husband who has hearing and memory issues. They have not been able to identify friend or family that can assist avoiding errors. [phone]

## 2023-05-20 DIAGNOSIS — G20A1 Parkinson's disease without dyskinesia, without mention of fluctuations: Secondary | ICD-10-CM | POA: Diagnosis not present

## 2023-05-20 DIAGNOSIS — K921 Melena: Secondary | ICD-10-CM | POA: Diagnosis not present

## 2023-05-20 DIAGNOSIS — K2971 Gastritis, unspecified, with bleeding: Secondary | ICD-10-CM | POA: Diagnosis not present

## 2023-05-20 DIAGNOSIS — D62 Acute posthemorrhagic anemia: Secondary | ICD-10-CM | POA: Diagnosis not present

## 2023-05-20 NOTE — Telephone Encounter (Signed)
 Take Tylenol 650 mg up to 4 times a day as needed headache.  Keep return office visit.  Thank you

## 2023-05-24 DIAGNOSIS — K921 Melena: Secondary | ICD-10-CM | POA: Diagnosis not present

## 2023-05-24 DIAGNOSIS — K2971 Gastritis, unspecified, with bleeding: Secondary | ICD-10-CM | POA: Diagnosis not present

## 2023-05-24 DIAGNOSIS — G20A1 Parkinson's disease without dyskinesia, without mention of fluctuations: Secondary | ICD-10-CM | POA: Diagnosis not present

## 2023-05-24 DIAGNOSIS — D62 Acute posthemorrhagic anemia: Secondary | ICD-10-CM | POA: Diagnosis not present

## 2023-05-26 DIAGNOSIS — K2101 Gastro-esophageal reflux disease with esophagitis, with bleeding: Secondary | ICD-10-CM

## 2023-05-26 DIAGNOSIS — K2971 Gastritis, unspecified, with bleeding: Secondary | ICD-10-CM

## 2023-05-26 DIAGNOSIS — M48061 Spinal stenosis, lumbar region without neurogenic claudication: Secondary | ICD-10-CM

## 2023-05-26 DIAGNOSIS — J45909 Unspecified asthma, uncomplicated: Secondary | ICD-10-CM

## 2023-05-26 DIAGNOSIS — F4323 Adjustment disorder with mixed anxiety and depressed mood: Secondary | ICD-10-CM

## 2023-05-26 DIAGNOSIS — G20A1 Parkinson's disease without dyskinesia, without mention of fluctuations: Secondary | ICD-10-CM

## 2023-05-26 DIAGNOSIS — G4733 Obstructive sleep apnea (adult) (pediatric): Secondary | ICD-10-CM

## 2023-05-26 DIAGNOSIS — N939 Abnormal uterine and vaginal bleeding, unspecified: Secondary | ICD-10-CM | POA: Diagnosis not present

## 2023-05-26 DIAGNOSIS — D62 Acute posthemorrhagic anemia: Secondary | ICD-10-CM

## 2023-05-26 DIAGNOSIS — K921 Melena: Secondary | ICD-10-CM

## 2023-05-26 DIAGNOSIS — E785 Hyperlipidemia, unspecified: Secondary | ICD-10-CM

## 2023-05-26 DIAGNOSIS — I10 Essential (primary) hypertension: Secondary | ICD-10-CM

## 2023-05-27 DIAGNOSIS — K921 Melena: Secondary | ICD-10-CM | POA: Diagnosis not present

## 2023-05-27 DIAGNOSIS — D62 Acute posthemorrhagic anemia: Secondary | ICD-10-CM | POA: Diagnosis not present

## 2023-05-27 DIAGNOSIS — K2971 Gastritis, unspecified, with bleeding: Secondary | ICD-10-CM | POA: Diagnosis not present

## 2023-05-27 DIAGNOSIS — G20A1 Parkinson's disease without dyskinesia, without mention of fluctuations: Secondary | ICD-10-CM | POA: Diagnosis not present

## 2023-05-31 DIAGNOSIS — K2971 Gastritis, unspecified, with bleeding: Secondary | ICD-10-CM | POA: Diagnosis not present

## 2023-05-31 DIAGNOSIS — K921 Melena: Secondary | ICD-10-CM | POA: Diagnosis not present

## 2023-05-31 DIAGNOSIS — D62 Acute posthemorrhagic anemia: Secondary | ICD-10-CM | POA: Diagnosis not present

## 2023-05-31 DIAGNOSIS — G20A1 Parkinson's disease without dyskinesia, without mention of fluctuations: Secondary | ICD-10-CM | POA: Diagnosis not present

## 2023-06-01 ENCOUNTER — Other Ambulatory Visit

## 2023-06-01 DIAGNOSIS — I10 Essential (primary) hypertension: Secondary | ICD-10-CM

## 2023-06-01 NOTE — Progress Notes (Addendum)
   06/01/2023 Name: Mallory Klein MRN: [id number] DOB: [birth year]  Chief Complaint  Patient presents with   Medication Access   Medication Adherence    Mallory Klein is a 78 y.o. year old female who presented for a telephone visit.   They were referred to the pharmacist by their PCP for assistance in managing medication access.   Pt noted issues with affording medications and also medication adherence. She has memory issues and noted she had not been taking her medications for a long time. She just recently started getting home health and they are now helping with her medications.   She called her pharmacy today and they stated that the cost of her medications for the month is $72. She asks if there is a way to get medications cheaper.  Spoke with [facility] regard pt's medications - they have all her medications ready for 90 DS in pill packaging besides Tramadol and methocarbamol are in bottles. All her meds are $0 except: Pramipexole $10, Seroquel $10, Vesicare $22, Tramadol $5 for 30 ds, Methocarbamol $16.96 30DS, Cetirizine $9.   Pt does not currently need tramadol or cetirizine - let the pharmacy know to remove those. Informed pt the cost is for 3 month supply. She notes she will go ahead and get them.  Rainelle Bur, PharmD, BCPS, CPP Clinical Pharmacist Practitioner [facility] [phone]  Medical screening examination/treatment/procedure(s) were performed by non-physician practitioner and as supervising physician I was immediately available for consultation/collaboration.  I agree with above. Adelaide Holy, MD

## 2023-06-03 DIAGNOSIS — G20A1 Parkinson's disease without dyskinesia, without mention of fluctuations: Secondary | ICD-10-CM | POA: Diagnosis not present

## 2023-06-03 DIAGNOSIS — K2971 Gastritis, unspecified, with bleeding: Secondary | ICD-10-CM | POA: Diagnosis not present

## 2023-06-03 DIAGNOSIS — K921 Melena: Secondary | ICD-10-CM | POA: Diagnosis not present

## 2023-06-03 DIAGNOSIS — D62 Acute posthemorrhagic anemia: Secondary | ICD-10-CM | POA: Diagnosis not present

## 2023-06-07 ENCOUNTER — Ambulatory Visit (INDEPENDENT_AMBULATORY_CARE_PROVIDER_SITE_OTHER): Admitting: Internal Medicine

## 2023-06-07 ENCOUNTER — Encounter: Payer: Self-pay | Admitting: Internal Medicine

## 2023-06-07 VITALS — BP 118/64 | HR 94 | Ht 61.5 in | Wt 149.0 lb

## 2023-06-07 DIAGNOSIS — Z8711 Personal history of peptic ulcer disease: Secondary | ICD-10-CM

## 2023-06-07 DIAGNOSIS — D500 Iron deficiency anemia secondary to blood loss (chronic): Secondary | ICD-10-CM

## 2023-06-07 DIAGNOSIS — K222 Esophageal obstruction: Secondary | ICD-10-CM | POA: Diagnosis not present

## 2023-06-07 DIAGNOSIS — D62 Acute posthemorrhagic anemia: Secondary | ICD-10-CM | POA: Diagnosis not present

## 2023-06-07 DIAGNOSIS — K2100 Gastro-esophageal reflux disease with esophagitis, without bleeding: Secondary | ICD-10-CM | POA: Diagnosis not present

## 2023-06-07 DIAGNOSIS — K254 Chronic or unspecified gastric ulcer with hemorrhage: Secondary | ICD-10-CM

## 2023-06-07 NOTE — Progress Notes (Signed)
 HISTORY OF PRESENT ILLNESS:  Mallory Klein is a 78 y.o. female with history of Parkinson's disease, IDA, GERD, OSA, asthma, prior esophagitis and PUD in 09/2022 presented to the hospital recently (April 12, 2023) with melena and hematochezia. Patient has had a significant hemoglobin drop from 11.4 in 11/2018 4-6.2 upon arrival. Patient's last EGD in 12/2022 showed a healing duodenal ulcer, distal esophageal stricture, moderate hiatal hernia. Patient's last colonoscopy in 03/2018 showed 4 small colon polyps that were resected, diverticulosis in the left colon, and internal hemorrhoids.   During this recent hospitalization patient was transfused.  She subsequently underwent colonoscopy and upper endoscopy on April 15, 2023.  Colonoscopy, including intubation of the terminal ileum, revealed dark blood throughout left-sided diverticulosis.  Felt to have diverticular bleed.  Upper endoscopy revealed mild esophagitis, esophageal stricture, hiatal hernia, superficial duodenal ulcer, and duodenal AVM that was treated with BiCap.  Gastric biopsies were said to have occurred, but I do not see the result.  The patient was discharged on April 16, 2023 after a 4-day hospital stay.  Discharge hemoglobin was 8.9.  Subsequently seen in the emergency room May 07, 2023 with concerns over low blood pressure at home.  After evaluation and treatment she was sent home.  Hemoglobin from that day was 9.6.  She presents today for follow-up.  She is accompanied by her husband.  She states she is doing well since her hospital discharge.  She wonders what caused her GI bleeding and how she can prevent future GI bleeding.  She does take occasional MiraLAX for constipation.  Otherwise, doing well.  We reviewed her medications today.  She has difficulty remembering what medicines she takes.  However, they tell me that someone comes to the home to assist with her medication distribution.      REVIEW OF SYSTEMS:  All  non-GI ROS negative unless otherwise stated in HPI except for anxiety, arthritis, back pain, hematuria, breast change, confusion, depression, fatigue, sleeping problems, ankle swelling, urinary leakage, shortness of breath  Past Medical History:  Diagnosis Date   Abnormal CBC 02/05/2015   Mallory Klein 12/16 new: normocytic anemia and thrombocytosis   Adjustment disorder with mixed anxiety and depressed mood 02/15/2007   Chronic  Chronic pain Grief, stress Effexor XR  Mallory Klein died in 2024-03-06   Asthma    Diabetes mellitus without complication (HCC)    Diverticulosis of colon (without mention of hemorrhage)    Esophageal stricture    Essential hypertension 01/26/2007   Chronic Verapamil    Falls    "I blackout"   GERD (gastroesophageal reflux disease) 02/15/2007   Chronic     Heart murmur    Hyperglycemia 03/02/2011   Mild     Insomnia disorder 12/08/2018   10/20 Carbid/Lev dose was increased - c/o hard time falling asleep (6 am) getting up at 12 am, poor sleep. Try Temazepam 15-30 mg at 11-1:30 pm    Internal hemorrhoids without mention of complication    Iron deficiency anemia 04/23/2017   Mild neurocognitive disorder, likely due to Parkinson's disease 02/09/2019   OSA (obstructive sleep apnea) 02/14/2010   In lab study (May 2018): AHI 25. Only had 45 minutes of sleep secondary to frequent awakenings secondary to sleep apnea. autocpap 5-15 cm water.    Osteoarthritis    Parkinson disease (HCC) 06-Mar-2014   2015 2017  Primidone - d/c 2019 Parkinson's: [facility] IR. Mallory Klein   Postherpetic trigeminal neuralgia 01/26/2007   Qualifier: Diagnosis of  By: Mallory Klein  Shortness of breath dyspnea    with allergies   Shoulder contusion 07/31/2020   severe R shoulder pain after a fall on 07/20/20, neck pain. There was a LOC. Pt went to ER 4 timeas. All CTs,X rays were (-)     Sleep apnea    Syncope and collapse 08/06/2020   Remote syncope on 07/20/2020.  Possible recurrent.  Cardiology consultation      Thrombocytosis 05/24/2017   Urinary incontinence 01/26/2007   Chronic  10/17 Detrol [place]    Vitamin D deficiency 01/26/2019    Past Surgical History:  Procedure Laterality Date   ABDOMINAL HYSTERECTOMY     BIOPSY  10/19/2022   Procedure: BIOPSY;  Surgeon: Mallory Forts, Klein;  Location: [facility] END[facility];  Service: Gastroenterology;;   BREAST CYST EXCISION Left 10/25/1978   COLONOSCOPY     Multiple   COLONOSCOPY WITH PROPOFOL N/A 04/15/2023   Procedure: COLONOSCOPY WITH PROPOFOL;  Surgeon: Mallory Drum, Klein;  Location: [facility] ENDOSCOPY;  Service: Gastroenterology;  Laterality: N/A;   ESOPHAGOGASTRODUODENOSCOPY (EGD) WITH PROPOFOL N/A 10/19/2022   Procedure: ESOPHAGOGASTRODUODENOSCOPY (EGD) WITH PROPOFOL;  Surgeon: Mallory Forts, Klein;  Location: [facility] ENDOSCOPY;  Service: Gastroenterology;  Laterality: N/A;   ESOPHAGOGASTRODUODENOSCOPY (EGD) WITH PROPOFOL N/A 04/15/2023   Procedure: ESOPHAGOGASTRODUODENOSCOPY (EGD) WITH PROPOFOL;  Surgeon: Mallory Drum, Klein;  Location: [facility] ENDOSCOPY;  Service: Gastroenterology;  Laterality: N/A;   HOT HEMOSTASIS N/A 04/15/2023   Procedure: HOT HEMOSTASIS (ARGON PLASMA COAGULATION/BICAP);  Surgeon: Mallory Drum, Klein;  Location: [facility] ENDOSCOPY;  Service: Gastroenterology;  Laterality: N/A;   LUMBAR LAMINECTOMY/DECOMPRESSION MICRODISCECTOMY Bilateral 02/20/2014   Procedure: LUMBAR TWO THREE, LUMBAR THREE FOUR LUMBAR LAMINECTOMY/DECOMPRESSION MICRODISCECTOMY 2 LEVELS;  Surgeon: Mallory Klein;  Location: [facility] NEURO ORS;  Service: Neurosurgery;  Laterality: Bilateral;   SPLENECTOMY     TIBIA FRACTURE SURGERY Right    TIBIA FRACTURE SURGERY Left    TOTAL KNEE ARTHROPLASTY Right 02/24/2011    Social History Mallory Klein  reports that she has never smoked. She has never used smokeless tobacco. She reports current alcohol use. She reports that she does not use drugs.  family history includes Asthma in her mother; Dementia in her mother; Diabetes in her mother; Diabetes  type II in her mother; Hypertension in her mother; Mental illness in her father; Prostate cancer in her father; Stroke in her brother; Suicidality in her son.  Allergies  Allergen Reactions   Ace Inhibitors     Patient doesn't recall reaction.  jkl   Benicar [Olmesartan]     It made her sick   Aspirin Other (See Comments)    bruising   Citalopram Hydrobromide Diarrhea       PHYSICAL EXAMINATION: Vital signs: BP 118/64   Pulse 94   Ht 5' 1.5" (1.562 m)   Wt 149 lb (67.6 kg)   BMI 27.70 kg/m   Constitutional: generally well-appearing, no acute distress Psychiatric: alert and oriented x3, cooperative Eyes: extraocular movements intact, anicteric, conjunctiva pink Mouth: oral pharynx moist, no lesions Neck: supple no lymphadenopathy Cardiovascular: heart regular rate and rhythm, no murmur Lungs: clear to auscultation bilaterally Abdomen: soft, nontender, nondistended, no obvious ascites, no peritoneal signs, normal bowel sounds, no organomegaly Rectal: Omitted Extremities: no clubbing or cyanosis.  Lower extremity edema on the left Skin: no relevant lesions on visible extremities Neuro: No focal deficits.  Parkinsonian tremor  ASSESSMENT:  1.  Recent hospitalization with GI bleeding.  Felt to be self-limited diverticular bleed. 2.  GERD with esophagitis  and peptic stricture 3.  History of giant duodenal/gastric ulcer.  Recent endoscopy with Klein ulcer and AVMs (which were treated with APC). 4.  Acute blood loss anemia.  Hemoglobin slowly improving 5.  Multiple medical problems   PLAN:  1.  Take 1 iron supplement daily indefinitely 2.  Take pantoprazole 40 mg twice daily indefinitely 3.  Avoid all OTC NSAIDs.  Reviewed. We have written the above on patient instructions  4.  Resume general medical care with Mallory. Georgia Kipper 5.  GI follow-up as needed A total time of 35 minutes was spent reviewing to see the patient, obtaining comprehensive interval history, performing  medically appropriate physical examination, counseling and educating the patient and her family regarding the above listed issues, and documenting clinical information in the health record

## 2023-06-07 NOTE — Patient Instructions (Signed)
 Make sure to take your iron once a day, every day.  Take your Pantoprazole twice a day, every day.  Avoid NSAIDS like Ibuprofen or Aleve.  Please follow up as needed.  _______________________________________________________  If your blood pressure at your visit was 140/90 or greater, please contact your primary care physician to follow up on this.  _______________________________________________________  If you are age 78 or older, your body mass index should be between 23-30. Your Body mass index is 27.7 kg/m. If this is out of the aforementioned range listed, please consider follow up with your Primary Care Provider.  If you are age 47 or younger, your body mass index should be between 19-25. Your Body mass index is 27.7 kg/m. If this is out of the aformentioned range listed, please consider follow up with your Primary Care Provider.   ________________________________________________________  The [facility] providers would like to encourage you to use MYCHART to communicate with providers for non-urgent requests or questions.  Due to long hold times on the telephone, sending your provider a message by [facility] may be a faster and more efficient way to get a response.  Please allow 48 business hours for a response.  Please remember that this is for non-urgent requests.  _______________________________________________________

## 2023-06-10 DIAGNOSIS — K921 Melena: Secondary | ICD-10-CM | POA: Diagnosis not present

## 2023-06-10 DIAGNOSIS — D62 Acute posthemorrhagic anemia: Secondary | ICD-10-CM | POA: Diagnosis not present

## 2023-06-10 DIAGNOSIS — K2971 Gastritis, unspecified, with bleeding: Secondary | ICD-10-CM | POA: Diagnosis not present

## 2023-06-10 DIAGNOSIS — G20A1 Parkinson's disease without dyskinesia, without mention of fluctuations: Secondary | ICD-10-CM | POA: Diagnosis not present

## 2023-06-11 DIAGNOSIS — K921 Melena: Secondary | ICD-10-CM | POA: Diagnosis not present

## 2023-06-11 DIAGNOSIS — G20A1 Parkinson's disease without dyskinesia, without mention of fluctuations: Secondary | ICD-10-CM | POA: Diagnosis not present

## 2023-06-11 DIAGNOSIS — K2971 Gastritis, unspecified, with bleeding: Secondary | ICD-10-CM | POA: Diagnosis not present

## 2023-06-11 DIAGNOSIS — D62 Acute posthemorrhagic anemia: Secondary | ICD-10-CM | POA: Diagnosis not present

## 2023-06-14 ENCOUNTER — Telehealth: Payer: Self-pay | Admitting: *Deleted

## 2023-06-14 ENCOUNTER — Ambulatory Visit: Payer: Medicare Other | Admitting: Internal Medicine

## 2023-06-14 ENCOUNTER — Telehealth: Payer: Self-pay

## 2023-06-14 DIAGNOSIS — G20A1 Parkinson's disease without dyskinesia, without mention of fluctuations: Secondary | ICD-10-CM

## 2023-06-14 NOTE — Telephone Encounter (Signed)
 Copied from CRM [id number]. Topic: General - Other >> Jun 10, 2023  4:10 PM Jenice Mitts wrote: Reason for CRM: Jola Nash [phone] calling from [facility] for patient. He stated they are discharging Physical Therapy for the patient because they are not making any progress  Reasons for no progress being made  No other caregivers are available to assist  Patient and her husband both have impaired memory  and also, her blood pressure skyrockets examples: after answering the front door and going to sit down - BP 160-100 after sitting for a long period BP 120/70 Home health nurse is still going to see patient just no PT   FYI

## 2023-06-14 NOTE — Telephone Encounter (Signed)
 Copied from CRM [id number]. Topic: General - Other >> Jun 11, 2023 10:46 AM Albertha Alosa wrote: Reason for CRM: Mallory Klein from [facility] home health called stating Patient having medication difficulty , forgetting medication has no family , see if we can get an home health social worker out to find some resources for them..  [phone]

## 2023-06-15 NOTE — Telephone Encounter (Signed)
 Noted.  Blood pressure elevation with activity might be related to chronic deconditioning.  What should we do?  Thank you

## 2023-06-17 DIAGNOSIS — K2971 Gastritis, unspecified, with bleeding: Secondary | ICD-10-CM | POA: Diagnosis not present

## 2023-06-17 DIAGNOSIS — K921 Melena: Secondary | ICD-10-CM | POA: Diagnosis not present

## 2023-06-17 DIAGNOSIS — D62 Acute posthemorrhagic anemia: Secondary | ICD-10-CM | POA: Diagnosis not present

## 2023-06-17 DIAGNOSIS — G20A1 Parkinson's disease without dyskinesia, without mention of fluctuations: Secondary | ICD-10-CM | POA: Diagnosis not present

## 2023-06-17 NOTE — Telephone Encounter (Signed)
 Can you place a referral for social worker

## 2023-06-21 NOTE — Telephone Encounter (Signed)
 Social worker through [place]? Can they take a verbal or do I need to put a home care referral? Thx

## 2023-06-23 NOTE — Telephone Encounter (Signed)
 Done. Thanks.

## 2023-06-24 DIAGNOSIS — K921 Melena: Secondary | ICD-10-CM | POA: Diagnosis not present

## 2023-06-24 DIAGNOSIS — D62 Acute posthemorrhagic anemia: Secondary | ICD-10-CM | POA: Diagnosis not present

## 2023-06-24 DIAGNOSIS — K2971 Gastritis, unspecified, with bleeding: Secondary | ICD-10-CM | POA: Diagnosis not present

## 2023-06-24 DIAGNOSIS — G20A1 Parkinson's disease without dyskinesia, without mention of fluctuations: Secondary | ICD-10-CM | POA: Diagnosis not present

## 2023-06-25 ENCOUNTER — Telehealth: Payer: Self-pay | Admitting: Internal Medicine

## 2023-06-25 ENCOUNTER — Ambulatory Visit (INDEPENDENT_AMBULATORY_CARE_PROVIDER_SITE_OTHER): Payer: Medicare Other

## 2023-06-25 VITALS — Ht 61.5 in | Wt 149.0 lb

## 2023-06-25 DIAGNOSIS — Z0000 Encounter for general adult medical examination without abnormal findings: Secondary | ICD-10-CM

## 2023-06-25 NOTE — Progress Notes (Signed)
 Subjective:   Mallory Klein is a 78 y.o. who presents for a Medicare Wellness preventive visit.  Visit Complete: Virtual I connected with  Mallory Klein on 06/25/23 by a audio enabled telemedicine application and verified that I am speaking with the correct person using two identifiers.  Patient Location: Home  Provider Location: Office/Clinic  I discussed the limitations of evaluation and management by telemedicine. The patient expressed understanding and agreed to proceed.  Vital Signs: Because this visit was a virtual/telehealth visit, some criteria may be missing or patient reported. Any vitals not documented were not able to be obtained and vitals that have been documented are patient reported.  VideoDeclined- This patient declined Librarian, academic. Therefore the visit was completed with audio only.  Persons Participating in Visit: Patient.  AWV Questionnaire: No: Patient Medicare AWV questionnaire was not completed prior to this visit.  Cardiac Risk Factors include: advanced age (>35men, >1 women);Other (see comment);hypertension, Risk factor comments: oSA,  Asthma     Objective:    Today's Vitals   06/25/23 1436  Weight: 149 lb (67.6 kg)  Height: 5' 1.5" (1.562 m)   Body mass index is 27.7 kg/m.     06/25/2023    2:44 PM 05/07/2023    6:42 PM 04/12/2023    5:00 PM 04/12/2023   12:30 PM 10/19/2022   12:24 PM 10/17/2022   10:53 AM 09/15/2022    3:55 PM  Advanced Directives  Does Patient Have a Medical Advance Directive? No No No No No No No  Would patient like information on creating a medical advance directive?   No - Patient declined  No - Patient declined No - Patient declined Yes (ED - Information included in AVS)    Current Medications (verified) Outpatient Encounter Medications as of 06/25/2023  Medication Sig   amLODipine  (NORVASC ) 5 MG tablet Take 1 tablet (5 mg total) by mouth daily.   cetirizine  (ZYRTEC ) 10 MG tablet  TAKE ONE TABLET BY MOUTH EVERY DAY   Cholecalciferol (VITAMIN D3) 50 MCG (2000 UT) capsule Take 1 capsule (2,000 Units total) by mouth daily.   [employer] SENNA PLUS 8.6-50 MG tablet Take 1 tablet by mouth 2 (two) times daily.   Cyanocobalamin  (VITAMIN B-12) 5000 MCG SUBL One SL daily   diclofenac  Sodium (VOLTAREN ) 1 % GEL Apply 4 g topically daily as needed (pain).   docusate sodium (COLACE) 100 MG capsule Take 100 mg by mouth at bedtime.   furosemide  (LASIX ) 40 MG tablet Take 1 tablet (40 mg total) by mouth daily as needed.   hydrochlorothiazide  (MICROZIDE ) 12.5 MG capsule Take 1 capsule (12.5 mg total) by mouth daily.   hydrOXYzine  (VISTARIL ) 50 MG capsule Take 1 capsule (50 mg total) by mouth every 6 (six) hours as needed for anxiety.   methocarbamol  (ROBAXIN ) 500 MG tablet Take 1 tablet (500 mg total) by mouth every 8 (eight) hours as needed for muscle spasms.   Multiple Vitamin (MULTIVITAMIN WITH MINERALS) TABS tablet Take 1 tablet by mouth daily.   pantoprazole  (PROTONIX ) 40 MG tablet Take 1 tablet (40 mg total) by mouth 2 (two) times daily.   polyethylene glycol (MIRALAX  / GLYCOLAX ) 17 g packet Take 17 g by mouth daily as needed for mild constipation.   Pramipexole  Dihydrochloride 0.75 MG TB24 Take 1 tablet (0.75 mg total) by mouth in the morning and at bedtime.   QUEtiapine  (SEROQUEL ) 100 MG tablet TAKE ONE TABLET BY MOUTH AT BEDTIME   rosuvastatin  (CRESTOR ) 10  MG tablet TAKE ONE TABLET BY MOUTH EVERY DAY   solifenacin  (VESICARE ) 5 MG tablet TAKE ONE TABLET BY MOUTH EVERY DAY   traMADol  (ULTRAM ) 50 MG tablet Take 1-2 tablets (50-100 mg total) by mouth every 8 (eight) hours as needed for severe pain (pain score 7-10).   potassium chloride  SA (KLOR-CON  M) 20 MEQ tablet Take 1 tablet (20 mEq total) by mouth daily for 3 days.   [DISCONTINUED] donepezil  (ARICEPT ) 5 MG tablet Take 1 tablet (5 mg total) by mouth at bedtime.   [DISCONTINUED] venlafaxine  XR (EFFEXOR -XR) 150 MG 24 hr capsule TAKE 1  CAPSULE BY MOUTH  EVERY DAY WITH BREAKFAST   No facility-administered encounter medications on file as of 06/25/2023.    Allergies (verified) Ace inhibitors, Benicar  [olmesartan ], Aspirin, and Citalopram hydrobromide   History: Past Medical History:  Diagnosis Date   Abnormal CBC 02/05/2015   Dr Maryalice Smaller 12/16 new: normocytic anemia and thrombocytosis   Adjustment disorder with mixed anxiety and depressed mood 02/15/2007   Chronic  Chronic pain Grief, stress Effexor  XR  Dad died in 2024/02/21   Asthma    Diabetes mellitus without complication (HCC)    Diverticulosis of colon (without mention of hemorrhage)    Esophageal stricture    Essential hypertension 01/26/2007   Chronic Verapamil     Falls    "I blackout"   GERD (gastroesophageal reflux disease) 02/15/2007   Chronic     Heart murmur    Hyperglycemia 03/02/2011   Mild     Insomnia disorder 12/08/2018   10/20 Carbid/Lev dose was increased - c/o hard time falling asleep (6 am) getting up at 12 am, poor sleep. Try Temazepam  15-30 mg at 11-1:30 pm    Internal hemorrhoids without mention of complication    Iron deficiency anemia 04/23/2017   Mild neurocognitive disorder, likely due to Parkinson's disease 02/09/2019   OSA (obstructive sleep apnea) 02/14/2010   In lab study (May 2018): AHI 25. Only had 45 minutes of sleep secondary to frequent awakenings secondary to sleep apnea. autocpap 5-15 cm water.    Osteoarthritis    Parkinson disease (HCC) 2014-02-20   2015 2017  Primidone  - d/c 2019 Parkinson's: [facility] IR. Dr Tat   Postherpetic trigeminal neuralgia 01/26/2007   Qualifier: Diagnosis of  By: Georganne Kind RMA, [name]     Shortness of breath dyspnea    with allergies   Shoulder contusion 07/31/2020   severe R shoulder pain after a fall on 07/20/20, neck pain. There was a LOC. Pt went to ER 4 timeas. All CTs,X rays were (-)     Sleep apnea    Syncope and collapse 08/06/2020   Remote syncope on 07/20/2020.  Possible recurrent.  Cardiology  consultation     Thrombocytosis 05/24/2017   Urinary incontinence 01/26/2007   Chronic  10/17 Detrol  LA    Vitamin D deficiency 01/26/2019   Past Surgical History:  Procedure Laterality Date   ABDOMINAL HYSTERECTOMY     BIOPSY  10/19/2022   Procedure: BIOPSY;  Surgeon: Tobin Forts, MD;  Location: [facility] ENDOSCOPY;  Service: Gastroenterology;;   BREAST CYST EXCISION Left 10/25/1978   COLONOSCOPY     Multiple   COLONOSCOPY WITH PROPOFOL  N/A 04/15/2023   Procedure: COLONOSCOPY WITH PROPOFOL ;  Surgeon: Daina Drum, MD;  Location: [facility] ENDOSCOPY;  Service: Gastroenterology;  Laterality: N/A;   ESOPHAGOGASTRODUODENOSCOPY (EGD) WITH PROPOFOL  N/A 10/19/2022   Procedure: ESOPHAGOGASTRODUODENOSCOPY (EGD) WITH PROPOFOL ;  Surgeon: Tobin Forts, MD;  Location: [facility] ENDOSCOPY;  Service: Gastroenterology;  Laterality: N/A;   ESOPHAGOGASTRODUODENOSCOPY (EGD) WITH PROPOFOL  N/A 04/15/2023   Procedure: ESOPHAGOGASTRODUODENOSCOPY (EGD) WITH PROPOFOL ;  Surgeon: Daina Drum, MD;  Location: [facility] ENDOSCOPY;  Service: Gastroenterology;  Laterality: N/A;   HOT HEMOSTASIS N/A 04/15/2023   Procedure: HOT HEMOSTASIS (ARGON PLASMA COAGULATION/BICAP);  Surgeon: Daina Drum, MD;  Location: [facility] ENDOSCOPY;  Service: Gastroenterology;  Laterality: N/A;   LUMBAR LAMINECTOMY/DECOMPRESSION MICRODISCECTOMY Bilateral 02/20/2014   Procedure: LUMBAR TWO THREE, LUMBAR THREE FOUR LUMBAR LAMINECTOMY/DECOMPRESSION MICRODISCECTOMY 2 LEVELS;  Surgeon: Isadora Mar, MD;  Location: [facility] NEURO ORS;  Service: Neurosurgery;  Laterality: Bilateral;   SPLENECTOMY     TIBIA FRACTURE SURGERY Right    TIBIA FRACTURE SURGERY Left    TOTAL KNEE ARTHROPLASTY Right 02/24/2011   Family History  Problem Relation Age of Onset   Hypertension Mother    Diabetes type II Mother    Asthma Mother    Diabetes Mother    Dementia Mother    Prostate cancer Father    Mental illness Father        dementia   Stroke Brother        died in prison    Suicidality Son    Colon cancer Neg Hx    Rectal cancer Neg Hx    Parkinson's disease Neg Hx    Colon polyps Neg Hx    Esophageal cancer Neg Hx    Stomach cancer Neg Hx    Ulcerative colitis Neg Hx    Social History   Socioeconomic History   Marital status: Married    Spouse name: Royston Cornea   Number of children: 2   Years of education: 14   Highest education level: Associate degree: occupational, Scientist, product/process development, or vocational program  Occupational History   Occupation: Retired    Comment: Diplomatic Services operational officer, Hospital doctor   Tobacco Use   Smoking status: Never   Smokeless tobacco: Never  Vaping Use   Vaping status: Never Used  Substance and Sexual Activity   Alcohol use: Yes    Comment: wine on occ    Drug use: No   Sexual activity: Not Currently  Other Topics Concern   Not on file  Social History Narrative   Drink tea and coffee on occ   Right handed   1 story    Lives with spouse   Social Drivers of Health   Financial Resource Strain: Low Risk  (06/25/2023)   Overall Financial Resource Strain (CARDIA)    Difficulty of Paying Living Expenses: Not very hard  Food Insecurity: No Food Insecurity (06/25/2023)   Hunger Vital Sign    Worried About Running Out of Food in the Last Year: Never true    Ran Out of Food in the Last Year: Never true  Transportation Needs: No Transportation Needs (06/25/2023)   PRAPARE - Administrator, Civil Service (Medical): No    Lack of Transportation (Non-Medical): No  Physical Activity: Inactive (06/25/2023)   Exercise Vital Sign    Days of Exercise per Week: 0 days    Minutes of Exercise per Session: 0 min  Stress: No Stress Concern Present (06/25/2023)   [employer] of Occupational Health - Occupational Stress Questionnaire    Feeling of Stress : Not at all  Social Connections: Moderately Isolated (06/25/2023)   Social Connection and Isolation Panel [NHANES]    Frequency of Communication with Friends and Family: More than three times a week     Frequency of Social Gatherings with Friends and Family: Never  Attends Religious Services: Never    Active Member of Clubs or Organizations: No    Attends Banker Meetings: Never    Marital Status: Married    Tobacco Counseling Counseling given: Not Answered    Clinical Intake:  Pre-visit preparation completed: Yes  Pain : No/denies pain     BMI - recorded: 27.7 Nutritional Status: BMI 25 -29 Overweight Nutritional Risks: None Diabetes: No  Lab Results  Component Value Date   HGBA1C 6.1 06/25/2022   HGBA1C 6.1 03/09/2022   HGBA1C 5.8 05/14/2021     How often do you need to have someone help you when you read instructions, pamphlets, or other written materials from your doctor or pharmacy?: 1 - Never  Interpreter Needed?: No  Information entered by :: Mikeyla Music, RMA   Activities of Daily Living     06/25/2023    2:38 PM 04/12/2023    5:00 PM  In your present state of health, do you have any difficulty performing the following activities:  Hearing? 0 0  Comment  Simultaneous filing. User may not have seen previous data.  Vision? 0 0  Comment  Simultaneous filing. User may not have seen previous data.  Difficulty concentrating or making decisions? 0 0  Comment  Simultaneous filing. User may not have seen previous data.  Walking or climbing stairs? 0   Dressing or bathing? 0   Doing errands, shopping? 0 1  Comment husband drives   Preparing Food and eating ? N   Using the Toilet? N   In the past six months, have you accidently leaked urine? N   Do you have problems with loss of bowel control? N   Managing your Medications? N   Managing your Finances? N   Housekeeping or managing your Housekeeping? N     Patient Care Team: Plotnikov, Oakley Bellman, MD as PCP - General (Internal Medicine) Awanda Bogaert, MD as Referring Physician (Orthopedic Surgery) Tobin Forts, MD (Gastroenterology) Joaquin Mulberry, MD as Consulting Physician  (Neurosurgery) Gerri Kras, MD (Inactive) as Consulting Physician (Pain Medicine) Sonja Eastover, MD as Consulting Physician (Hematology) Burton, Lacie K, NP as Nurse Practitioner (Nurse Practitioner) Tat, Von Grumbling, DO as Consulting Physician (Neurology) Raydell Cahill, MD as Referring Physician (Physical Medicine and Rehabilitation) Ladon Pickler, MD as Referring Physician (Ophthalmology) Marvis Sluder, MD as Referring Physician (Ophthalmology)  Indicate any recent Medical Services you may have received from other than [facility] providers in the past year (date may be approximate).     Assessment:   This is a routine wellness examination for Karolyne.  Hearing/Vision screen Hearing Screening - Comments:: Denies hearing difficulties   Vision Screening - Comments:: Wears eyeglasses   Goals Addressed               This Visit's Progress     Client understands the importance of follow-up with providers by attending scheduled visits (pt-stated)   On track      Depression Screen     06/25/2023    2:48 PM 05/11/2023    2:45 PM 11/16/2022    9:24 AM 10/29/2022    2:38 PM 09/17/2022    8:39 AM 06/24/2022    2:37 PM 05/14/2021    3:16 PM  PHQ 2/9 Scores  PHQ - 2 Score 1 1 1 2 2  0 0  PHQ- 9 Score 2   7 8 6      Fall Risk     06/25/2023    2:45 PM 05/11/2023  2:44 PM 09/17/2022    8:39 AM 06/24/2022    2:36 PM 03/09/2022    2:44 PM  Fall Risk   Falls in the past year? 1 0 1 1 1   Number falls in past yr: 0 0 1 1 0  Injury with Fall? 1 0 1 1 0  Risk for fall due to : Impaired balance/gait;Medication side effect No Fall Risks Impaired balance/gait;History of fall(s) History of fall(s);Impaired balance/gait;Orthopedic patient Impaired balance/gait;History of fall(s)  Follow up Falls prevention discussed;Falls evaluation completed Falls evaluation completed Falls evaluation completed Education provided;Falls prevention discussed Falls evaluation completed    MEDICARE RISK AT HOME:   Medicare Risk at Home Any stairs in or around the home?: No Home free of loose throw rugs in walkways, pet beds, electrical cords, etc?: Yes Adequate lighting in your home to reduce risk of falls?: Yes Life alert?: No Use of a cane, walker or w/c?: Yes (walker) Grab bars in the bathroom?: Yes Shower chair or bench in shower?: Yes Elevated toilet seat or a handicapped toilet?: Yes  TIMED UP AND GO:  Was the test performed?  No  Cognitive Function: Declined/Normal: No cognitive concerns noted by patient or family. Patient alert, oriented, able to answer questions appropriately and recall recent events. No signs of memory loss or confusion.    06/24/2022    2:48 PM  MMSE - Mini Mental State Exam  Not completed: Unable to complete      05/13/2018    4:11 PM  Montreal Cognitive Assessment   Visuospatial/ Executive (0/5) 3  Naming (0/3) 2  Attention: Read list of digits (0/2) 2  Attention: Read list of letters (0/1) 0  Attention: Serial 7 subtraction starting at 100 (0/3) 3  Language: Repeat phrase (0/2) 2  Language : Fluency (0/1) 0  Abstraction (0/2) 2  Delayed Recall (0/5) 1  Orientation (0/6) 6  Total 21      Immunizations Immunization History  Administered Date(s) Administered   Fluad Quad(high Dose 65+) 10/19/2018, 12/13/2019, 12/05/2020, 11/05/2021   Fluad Trivalent(High Dose 65+) 11/16/2022   Influenza Split 11/20/2010, 12/04/2011   Influenza Whole 12/02/2007, 01/03/2009   Influenza, High Dose Seasonal PF 12/03/2015, 02/12/2017, 12/20/2017   Influenza,inj,Quad PF,6+ Mos 10/21/2012, 02/09/2014, 02/05/2015   Meningococcal polysaccharide vaccine (MPSV4) 03/29/2006   PFIZER(Purple Top)SARS-COV-2 Vaccination 04/16/2019, 05/10/2019, 02/13/2020   Pneumococcal Conjugate-13 04/25/2013   Pneumococcal Polysaccharide-23 03/29/2006, 03/02/2011   Td 07/19/2012   Zoster Recombinant(Shingrix) 10/30/2020   Zoster, Live 03/19/2011    Screening Tests Health Maintenance  Topic  Date Due   DTaP/Tdap/Td (2 - Tdap) 07/20/2022   COVID-19 Vaccine (4 - 2024-25 season) 10/25/2022   Medicare Annual Wellness (AWV)  06/24/2023   INFLUENZA VACCINE  09/24/2023   Colonoscopy  04/14/2026   Pneumonia Vaccine 53+ Years old  Completed   DEXA SCAN  Completed   Hepatitis C Screening  Completed   HPV VACCINES  Aged Out   Meningococcal B Vaccine  Aged Out   Zoster Vaccines- Shingrix  Discontinued    Health Maintenance  Health Maintenance Due  Topic Date Due   DTaP/Tdap/Td (2 - Tdap) 07/20/2022   COVID-19 Vaccine (4 - 2024-25 season) 10/25/2022   Medicare Annual Wellness (AWV)  06/24/2023   Health Maintenance Items Addressed: See Nurse Notes  Additional Screening:  Vision Screening: Recommended annual ophthalmology exams for early detection of glaucoma and other disorders of the eye.  Dental Screening: Recommended annual dental exams for proper oral hygiene  Community Resource Referral / Chronic  Care Management: CRR required this visit?  No   CCM required this visit?  No     Plan:     I have personally reviewed and noted the following in the patient's chart:   Medical and social history Use of alcohol, tobacco or illicit drugs  Current medications and supplements including opioid prescriptions. Patient is currently taking opioid prescriptions. Information provided to patient regarding non-opioid alternatives. Patient advised to discuss non-opioid treatment plan with their provider. Functional ability and status Nutritional status Physical activity Advanced directives List of other physicians Hospitalizations, surgeries, and ER visits in previous 12 months Vitals Screenings to include cognitive, depression, and falls Referrals and appointments  In addition, I have reviewed and discussed with patient certain preventive protocols, quality metrics, and best practice recommendations. A written personalized care plan for preventive services as well as general  preventive health recommendations were provided to patient.     Jorene Kaylor L Conya Ellinwood, CMA   06/25/2023   After Visit Summary: (MyChart) Due to this being a telephonic visit, the after visit summary with patients personalized plan was offered to patient via MyChart   Notes: Please refer to Routing Comments.

## 2023-06-25 NOTE — Telephone Encounter (Unsigned)
 Copied from CRM [id number]. Topic: Clinical - Home Health Verbal Orders >> Jun 25, 2023 11:31 AM Adonis Hoot wrote: Caller/Agency: Ethelda Hensen Number: [phone] Any new concerns about the patient? Yes Discharged as of 06/24/2023.She still have a lot of memory issues.They are planning to give her a reminder call to fill her pill box to take her medications.

## 2023-06-25 NOTE — Patient Instructions (Addendum)
 Mallory Klein , Thank you for taking time to come for your Medicare Wellness Visit. I appreciate your ongoing commitment to your health goals. Please review the following plan we discussed and let me know if I can assist you in the future.   Referrals/Orders/Follow-Ups/Clinician Recommendations: It was nice to talk with you today.  You are due for a tetanus vaccine and can get that done at the pharmacy.  Each day, aim for 6 glasses of water, plenty of protein in your diet and try to get up and walk/ stretch every hour for 5-10 minutes at a time.    This is a list of the screening recommended for you and due dates:  Health Maintenance  Topic Date Due   DTaP/Tdap/Td vaccine (2 - Tdap) 07/20/2022   COVID-19 Vaccine (4 - 2024-25 season) 10/25/2022   Medicare Annual Wellness Visit  06/24/2023   Flu Shot  09/24/2023   Colon Cancer Screening  04/14/2026   Pneumonia Vaccine  Completed   DEXA scan (bone density measurement)  Completed   Hepatitis C Screening  Completed   HPV Vaccine  Aged Out   Meningitis B Vaccine  Aged Out   Zoster (Shingles) Vaccine  Discontinued    Advanced directives: (Declined) Advance directive discussed with you today. Even though you declined this today, please call our office should you change your mind, and we can give you the proper paperwork for you to fill out.  Next Medicare Annual Wellness Visit scheduled for next year: Yes  Have you seen your provider in the last 6 months (3 months if uncontrolled diabetes)? Yes

## 2023-06-28 NOTE — Telephone Encounter (Signed)
 Noted! Thank you

## 2023-07-05 ENCOUNTER — Telehealth: Payer: Self-pay | Admitting: Internal Medicine

## 2023-07-05 DIAGNOSIS — Z79899 Other long term (current) drug therapy: Secondary | ICD-10-CM

## 2023-07-05 NOTE — Telephone Encounter (Unsigned)
 Copied from CRM [id number]. Topic: General - Other >> Jul 05, 2023  3:40 PM Dorisann Garre T wrote: Reason for CRM: patient is calling In stating that the help she was having with her medication has stop coming she has a lot of medication she takes and she doesn't know how to take it she need's a call back today to see if she can get some help out to her to help with all the medications

## 2023-07-06 NOTE — Telephone Encounter (Signed)
 Per the DOD "We can order a pharmacist to call her and review her meds.  I think this may be better than a nurse  -- referral ordered."

## 2023-07-06 NOTE — Telephone Encounter (Signed)
 We can order a pharmacist to call her and review her meds.  I think this may be better than a nurse  -- referral ordered

## 2023-07-06 NOTE — Telephone Encounter (Signed)
 Patient is needing a call back today regarding help with her medication

## 2023-07-06 NOTE — Addendum Note (Signed)
 Addended by: Colene Dauphin on: 07/06/2023 02:59 PM   Modules accepted: Orders

## 2023-07-06 NOTE — Telephone Encounter (Signed)
 I've attempted to reach out to the pt... However, pts VM is full at this time and I am unable to lvm for pt.  I did send tps request for someone to come out to her home to help with medication management to PCP. Pcp is currently out of the office. I have also sent DOD a message asking if she can out an order in for a nurse to go to pts home in place PCP being out.

## 2023-07-06 NOTE — Telephone Encounter (Unsigned)
 Copied from CRM [id number]. Topic: General - Other >> Jul 05, 2023  3:40 PM Mallory Klein wrote: Reason for CRM: patient is calling In stating that the help she was having with her medication has stop coming she has a lot of medication she takes and she doesn'Klein know how to take it she need's a call back today to see if she can get some help out to her to help with all the medications >> Jul 06, 2023  4:19 PM Mallory Klein wrote: Patient called wanting an follow-up on her medication. Patient states she need help to get on the right track. I called [facility] and they stated a referral was placed for a pharmacist to call her or come to the home in regards to helping her with her medication. Patient wanted to know about the time frame of the pharmacist coming out  CB  >> Jul 06, 2023  2:38 PM Mallory Klein wrote: Patient called to follow up on this, she would like a call back today because she needs to take her medicine

## 2023-07-06 NOTE — Telephone Encounter (Signed)
 I was able to speak with the pt and inform her that I was able to get the message back to PCP to send orders for a nurse to come out and help with med management a/w there has been a referral placed for a pharmacist to get in contact with her as well.

## 2023-07-07 ENCOUNTER — Telehealth: Payer: Self-pay | Admitting: *Deleted

## 2023-07-07 NOTE — Progress Notes (Signed)
 Care Guide Pharmacy Note  07/07/2023 Name: Mallory Klein MRN: [id number] DOB: February 27, 1945  Referred By: Genia Kettering, MD Reason for referral: Complex Care Management (Outreach to schedule referral with pharmacist )   Mallory Klein is a 78 y.o. year old female who is a primary care patient of Plotnikov, Oakley Bellman, MD.  Mallory Klein was referred to the pharmacist for assistance related to: polypharmacy  Successful contact was made with the patient to discuss pharmacy services including being ready for the pharmacist to call at least 5 minutes before the scheduled appointment time and to have medication bottles and any blood pressure readings ready for review. The patient agreed to meet with the pharmacist via telephone visit on 07/22/2023  Kandis Ormond, CMA [place]  [facility] Guide Direct Dial: [phone]  Fax: [phone] Website: [email or web address]

## 2023-07-09 ENCOUNTER — Encounter (HOSPITAL_BASED_OUTPATIENT_CLINIC_OR_DEPARTMENT_OTHER): Payer: Self-pay | Admitting: Emergency Medicine

## 2023-07-09 ENCOUNTER — Other Ambulatory Visit: Payer: Self-pay

## 2023-07-09 ENCOUNTER — Emergency Department (HOSPITAL_BASED_OUTPATIENT_CLINIC_OR_DEPARTMENT_OTHER): Admission: EM | Admit: 2023-07-09 | Discharge: 2023-07-09 | Disposition: A | Discharge status: Home / Self Care

## 2023-07-09 DIAGNOSIS — R410 Disorientation, unspecified: Secondary | ICD-10-CM | POA: Insufficient documentation

## 2023-07-09 DIAGNOSIS — R531 Weakness: Secondary | ICD-10-CM

## 2023-07-09 DIAGNOSIS — G20A1 Parkinson's disease without dyskinesia, without mention of fluctuations: Secondary | ICD-10-CM | POA: Diagnosis not present

## 2023-07-09 DIAGNOSIS — Z91148 Patient's other noncompliance with medication regimen for other reason: Secondary | ICD-10-CM | POA: Diagnosis not present

## 2023-07-09 DIAGNOSIS — Z79899 Other long term (current) drug therapy: Secondary | ICD-10-CM | POA: Diagnosis not present

## 2023-07-09 DIAGNOSIS — I129 Hypertensive chronic kidney disease with stage 1 through stage 4 chronic kidney disease, or unspecified chronic kidney disease: Secondary | ICD-10-CM | POA: Insufficient documentation

## 2023-07-09 DIAGNOSIS — D649 Anemia, unspecified: Secondary | ICD-10-CM | POA: Diagnosis not present

## 2023-07-09 DIAGNOSIS — N189 Chronic kidney disease, unspecified: Secondary | ICD-10-CM | POA: Diagnosis not present

## 2023-07-09 DIAGNOSIS — I10 Essential (primary) hypertension: Secondary | ICD-10-CM | POA: Diagnosis not present

## 2023-07-09 DIAGNOSIS — R5381 Other malaise: Secondary | ICD-10-CM | POA: Diagnosis not present

## 2023-07-09 DIAGNOSIS — R5383 Other fatigue: Secondary | ICD-10-CM | POA: Insufficient documentation

## 2023-07-09 DIAGNOSIS — G20C Parkinsonism, unspecified: Secondary | ICD-10-CM | POA: Diagnosis not present

## 2023-07-09 LAB — BASIC METABOLIC PANEL WITH GFR
Anion gap: 11 (ref 5–15)
BUN: 18 mg/dL (ref 8–23)
CO2: 28 mmol/L (ref 22–32)
Calcium: 9.6 mg/dL (ref 8.9–10.3)
Chloride: 104 mmol/L (ref 98–111)
Creatinine, Ser: 1.12 mg/dL — ABNORMAL HIGH (ref 0.44–1.00)
GFR, Estimated: 50 mL/min — ABNORMAL LOW (ref >60)
Glucose, Bld: 97 mg/dL (ref 70–99)
Potassium: 4 mmol/L (ref 3.5–5.1)
Sodium: 143 mmol/L (ref 135–145)

## 2023-07-09 LAB — URINALYSIS, W/ REFLEX TO CULTURE (INFECTION SUSPECTED)
Bilirubin Urine: NEGATIVE
Glucose, UA: NEGATIVE mg/dL
Hgb urine dipstick: NEGATIVE
Leukocytes,Ua: NEGATIVE
Nitrite: NEGATIVE
Protein, ur: 30 mg/dL — AB
Specific Gravity, Urine: 1.032 — ABNORMAL HIGH (ref 1.005–1.030)
pH: 6 (ref 5.0–8.0)

## 2023-07-09 LAB — CBC
HCT: 31.7 % — ABNORMAL LOW (ref 36.0–46.0)
Hemoglobin: 10.2 g/dL — ABNORMAL LOW (ref 12.0–15.0)
MCH: 28.8 pg (ref 26.0–34.0)
MCHC: 32.2 g/dL (ref 30.0–36.0)
MCV: 89.5 fL (ref 80.0–100.0)
Platelets: 375 10*3/uL (ref 150–400)
RBC: 3.54 MIL/uL — ABNORMAL LOW (ref 3.87–5.11)
RDW: 20.9 % — ABNORMAL HIGH (ref 11.5–15.5)
WBC: 8.6 10*3/uL (ref 4.0–10.5)
nRBC: 0 % (ref 0.0–0.2)

## 2023-07-09 NOTE — ED Triage Notes (Signed)
 Pt caox4 c/o malaise and pain all over stating she has PMH Parkinson's and that she has not had her normal home health care coming to make sure she takes her medications. Pt reports it has been approx 1.5-2 wks without taking her meds regularly, s/s x1 wk.

## 2023-07-09 NOTE — Discharge Instructions (Signed)
 Take your Parkinson's medications.  Please follow-up with your primary doctor

## 2023-07-09 NOTE — Care Management (Signed)
 Patient was seen at [facility] for medication assistance.  ED RNCM reviewed patient's chart and noted that the PCP has placed a referral Home Pharmacy to go out to the patient's home to review medications.The referral was placed Wed 5/14.  Reached out to the office to Follow up with this patient and also the Community Care Management with [facility] Department as well. To follow-up with patient.  Updated patient that this was initiated.

## 2023-07-09 NOTE — ED Notes (Signed)
 ED Provider at bedside.

## 2023-07-09 NOTE — ED Notes (Signed)
Reviewed discharge instructions and home care with pt. Pt verbalized understanding and had no further questions. Pt exited ED without complications.

## 2023-07-09 NOTE — ED Provider Notes (Addendum)
 [place] EMERGENCY DEPARTMENT AT [facility] Provider Note   CSN: [id number] Arrival date & time: 07/09/23  1553     History  Chief Complaint  Patient presents with   Fatigue    Mallory Klein is a 78 y.o. female.  This is a 78 year old female presenting emergency department with generalized malaise and feeling unwell for the past several weeks.  History of Parkinson's, has not taken her medications for the same duration.  Reports that she gets confused today and often forgets to take them.  Has no specific complaint other than she feels unwell.  No headache, no vision changes, no chest pain, no shortness of breath no abdominal pain no nausea or vomiting.  She is in process to get home health with her primary doctor, but is supposedly taking a long time.        Home Medications Prior to Admission medications   Medication Sig Start Date End Date Taking? Authorizing Provider  amLODipine  (NORVASC ) 5 MG tablet Take 1 tablet (5 mg total) by mouth daily. 05/11/23   Plotnikov, Karlynn GAILS, MD  cetirizine  (ZYRTEC ) 10 MG tablet TAKE ONE TABLET BY MOUTH EVERY DAY 09/21/22   Plotnikov, Aleksei V, MD  Cholecalciferol (VITAMIN D3) 50 MCG (2000 UT) capsule Take 1 capsule (2,000 Units total) by mouth daily. 12/21/22   Plotnikov, Karlynn GAILS, MD  [employer] SENNA PLUS 8.6-50 MG tablet Take 1 tablet by mouth 2 (two) times daily. 09/26/20   [provider]  Cyanocobalamin  (VITAMIN B-12) 5000 MCG SUBL One SL daily 04/22/23   Plotnikov, Aleksei V, MD  diclofenac  Sodium (VOLTAREN ) 1 % GEL Apply 4 g topically daily as needed (pain). 04/25/13   [provider]  docusate sodium (COLACE) 100 MG capsule Take 100 mg by mouth at bedtime. 06/26/19   [provider]  furosemide  (LASIX ) 40 MG tablet Take 1 tablet (40 mg total) by mouth daily as needed. 04/28/23 04/27/24  Plotnikov, Karlynn GAILS, MD  hydrochlorothiazide  (MICROZIDE ) 12.5 MG capsule Take 1 capsule (12.5 mg total) by mouth daily.  01/19/23   Plotnikov, Aleksei V, MD  hydrOXYzine  (VISTARIL ) 50 MG capsule Take 1 capsule (50 mg total) by mouth every 6 (six) hours as needed for anxiety. 10/21/22   [name], MD  methocarbamol  (ROBAXIN ) 500 MG tablet Take 1 tablet (500 mg total) by mouth every 8 (eight) hours as needed for muscle spasms. 11/26/22   Plotnikov, Aleksei V, MD  Multiple Vitamin (MULTIVITAMIN WITH MINERALS) TABS tablet Take 1 tablet by mouth daily. 10/22/22   [name], MD  pantoprazole  (PROTONIX ) 40 MG tablet Take 1 tablet (40 mg total) by mouth 2 (two) times daily. 04/22/23   Plotnikov, Aleksei V, MD  polyethylene glycol (MIRALAX  / GLYCOLAX ) 17 g packet Take 17 g by mouth daily as needed for mild constipation.    [provider]  potassium chloride  SA (KLOR-CON  M) 20 MEQ tablet Take 1 tablet (20 mEq total) by mouth daily for 3 days. 04/22/23 04/25/23  Plotnikov, Karlynn GAILS, MD  Pramipexole  Dihydrochloride 0.75 MG TB24 Take 1 tablet (0.75 mg total) by mouth in the morning and at bedtime. 06/25/22   [name], [name], NP  QUEtiapine  (SEROQUEL ) 100 MG tablet TAKE ONE TABLET BY MOUTH AT BEDTIME 01/18/23   Plotnikov, Aleksei V, MD  rosuvastatin  (CRESTOR ) 10 MG tablet TAKE ONE TABLET BY MOUTH EVERY DAY 12/04/22   Plotnikov, Aleksei V, MD  solifenacin  (VESICARE ) 5 MG tablet TAKE ONE TABLET BY MOUTH EVERY DAY 01/18/23  Plotnikov, Aleksei V, MD  traMADol  (ULTRAM ) 50 MG tablet Take 1-2 tablets (50-100 mg total) by mouth every 8 (eight) hours as needed for severe pain (pain score 7-10). 12/21/22   Plotnikov, Karlynn GAILS, MD  donepezil  (ARICEPT ) 5 MG tablet Take 1 tablet (5 mg total) by mouth at bedtime. 11/27/19 07/23/20  Plotnikov, Aleksei V, MD  venlafaxine  XR (EFFEXOR -XR) 150 MG 24 hr capsule TAKE 1 CAPSULE BY MOUTH  EVERY DAY WITH BREAKFAST 05/29/19 07/23/20  Plotnikov, Aleksei V, MD      Allergies    Ace inhibitors, Benicar  [olmesartan ], Aspirin, and Citalopram hydrobromide    Review of Systems   Review of  Systems  Physical Exam Updated Vital Signs BP (!) 166/109   Pulse 73   Temp 97.8 F (36.6 C)   Resp 18   SpO2 100%  Physical Exam Vitals and nursing note reviewed.  Constitutional:      Appearance: Normal appearance. She is obese.  HENT:     Head: Normocephalic.     Nose: Nose normal.     Mouth/Throat:     Mouth: Mucous membranes are moist.  Eyes:     Pupils: Pupils are equal, round, and reactive to light.  Cardiovascular:     Rate and Rhythm: Normal rate and regular rhythm.  Pulmonary:     Effort: Pulmonary effort is normal.     Breath sounds: Normal breath sounds.  Abdominal:     General: Abdomen is flat. There is no distension.     Palpations: Abdomen is soft.     Tenderness: There is no abdominal tenderness. There is no guarding or rebound.  Musculoskeletal:        General: Normal range of motion.  Skin:    General: Skin is warm.     Capillary Refill: Capillary refill takes less than 2 seconds.  Neurological:     Mental Status: She is alert and oriented to person, place, and time.  Psychiatric:        Mood and Affect: Mood normal.        Behavior: Behavior normal.     ED Results / Procedures / Treatments   Labs (all labs ordered are listed, but only abnormal results are displayed) Labs Reviewed  CBC - Abnormal; Notable for the following components:      Result Value   RBC 3.54 (*)    Hemoglobin 10.2 (*)    HCT 31.7 (*)    RDW 20.9 (*)    All other components within normal limits  BASIC METABOLIC PANEL WITH GFR - Abnormal; Notable for the following components:   Creatinine, Ser 1.12 (*)    GFR, Estimated 50 (*)    All other components within normal limits  URINALYSIS, W/ REFLEX TO CULTURE (INFECTION SUSPECTED) - Abnormal; Notable for the following components:   Specific Gravity, Urine 1.032 (*)    Ketones, ur TRACE (*)    Protein, ur 30 (*)    Bacteria, UA RARE (*)    All other components within normal limits    EKG None  Radiology No results  found.  Procedures Procedures    Medications Ordered in ED Medications - No data to display  ED Course/ Medical Decision Making/ A&P Clinical Course as of 07/09/23 2034  Fri Jul 09, 2023  2028 Labs reassuring seemingly at her baseline.  No metabolic derangement.  Chronic kidney disease.  Stable anemia.  No urinary tract infection.  Face-to-face order placed for home health.  Social work contacted as  well.  Discussed with husband that he may need to help administer medications.  Patient and husband note that they are working with primary doctor for resources as well, but is taking a long time.  I believe she is safe for discharge at this time [TY]    Clinical Course User Index [TY] Neysa Caron PARAS, DO                                 Medical Decision Making Is a 78 year old presenting with feeling unwell with no specific complaint she is afebrile nontachycardic, slightly hypertensive.  Per chart review does have a history of Parkinson's; I suspect her generalized malaise is secondary to medication noncompliance.  However, we will get screening labs to evaluate for organic cause.  See ED course for further MDM and disposition.  Amount and/or Complexity of Data Reviewed Independent Historian:     Details: Husband notes that medications are in day packs from pharmacy External Data Reviewed:     Details: Seen in March for fatigue and discharge at that time after some IV fluid Labs: ordered. Decision-making details documented in ED Course. Radiology:  Decision-making details documented in ED Course.  Risk Decision regarding hospitalization. Diagnosis or treatment significantly limited by social determinants of health.         Final Clinical Impression(s) / ED Diagnoses Final diagnoses:  Malaise weakness Parksonsonism Medication non-compliance.      Rx / DC Orders ED Discharge Orders     None         Neysa Caron PARAS, DO 07/09/23 2034    Neysa Caron PARAS,  DO 08/30/23 [phone]

## 2023-07-13 ENCOUNTER — Encounter: Payer: Self-pay | Admitting: Family Medicine

## 2023-07-13 ENCOUNTER — Ambulatory Visit (INDEPENDENT_AMBULATORY_CARE_PROVIDER_SITE_OTHER): Admitting: Family Medicine

## 2023-07-13 VITALS — BP 140/88 | HR 98 | Temp 97.8°F | Ht 61.5 in | Wt 139.6 lb

## 2023-07-13 DIAGNOSIS — G3184 Mild cognitive impairment, so stated: Secondary | ICD-10-CM

## 2023-07-13 DIAGNOSIS — J301 Allergic rhinitis due to pollen: Secondary | ICD-10-CM | POA: Insufficient documentation

## 2023-07-13 DIAGNOSIS — Z79899 Other long term (current) drug therapy: Secondary | ICD-10-CM | POA: Insufficient documentation

## 2023-07-13 DIAGNOSIS — N183 Chronic kidney disease, stage 3 unspecified: Secondary | ICD-10-CM

## 2023-07-13 DIAGNOSIS — R051 Acute cough: Secondary | ICD-10-CM

## 2023-07-13 DIAGNOSIS — G20A1 Parkinson's disease without dyskinesia, without mention of fluctuations: Secondary | ICD-10-CM

## 2023-07-13 MED ORDER — BENZONATATE 100 MG PO CAPS: 100.0000 mg | ORAL_CAPSULE | Freq: Three times a day (TID) | ORAL | 0 refills | Status: DC | PRN

## 2023-07-13 NOTE — Patient Instructions (Signed)
 May restart cetirizine   Benzonatate to pharmacy may take 3 times a day as needed for cough  Follow-up as needed, as scheduled with Dr. Plotnikov

## 2023-07-13 NOTE — Progress Notes (Unsigned)
 Acute Office Visit  Subjective:     Patient ID: Mallory Klein, female    DOB: 07/25/1945, 78 y.o.   MRN: [id number]  Chief Complaint  Patient presents with   Acute Visit    Ongoing since last Thursday, coughing up mucus, unsure if it has color.     HPI Patient is in today for evaluation of cough, for the last week. She is accompanied by her husband today. Has tried Delsym at home. Denies known sick contacts. Denies abdominal pain, nausea, vomiting, diarrhea, rash, fever, chills, other symptoms.  Medical hx as outlined below.  Reports that she is having issues with her medications.  States that she has not taken her medications in about 3 weeks since home health nursing has not been out to her house since then.  States that insurance not paying for this. Per husband, they already have medications prepackaged. Patient states that she just gets confused and cannot manage her medications.  ROS Per HPI      Objective:    BP (!) 140/88 (BP Location: Left Arm, Patient Position: Sitting)   Pulse 98   Temp 97.8 F (36.6 C) (Temporal)   Ht 5' 1.5" (1.562 m)   Wt 139 lb 9.6 oz (63.3 kg)   SpO2 100%   BMI 25.95 kg/m    Physical Exam Vitals and nursing note reviewed.  Constitutional:      General: She is not in acute distress.    Comments: Appears fatigued  HENT:     Head: Normocephalic and atraumatic.     Right Ear: External ear normal.     Left Ear: External ear normal.     Nose: Nose normal.     Mouth/Throat:     Mouth: Mucous membranes are moist.     Pharynx: Oropharynx is clear.     Comments: Oropharyngeal cobblestoning   Eyes:     Extraocular Movements: Extraocular movements intact.     Pupils: Pupils are equal, round, and reactive to light.  Cardiovascular:     Rate and Rhythm: Normal rate and regular rhythm.     Pulses: Normal pulses.     Heart sounds: Normal heart sounds.  Pulmonary:     Effort: Pulmonary effort is normal. No respiratory distress.      Breath sounds: Normal breath sounds. No wheezing, rhonchi or rales.     Comments: Dry cough Musculoskeletal:     Cervical back: Normal range of motion.     Right lower leg: No edema.     Left lower leg: No edema.     Comments: Wheelchair for visit  Lymphadenopathy:     Cervical: No cervical adenopathy.  Neurological:     Mental Status: She is alert and oriented to person, place, and time. Mental status is at baseline.     Comments: Tremors to both hands and arms, more severe on right than left  Psychiatric:        Mood and Affect: Mood normal.        Thought Content: Thought content normal.     No results found for any visits on 07/13/23.      Assessment & Plan:   Seasonal allergic rhinitis due to pollen Assessment & Plan: May need to restart loratadine  to help with allergy  symptoms   Acute cough Assessment & Plan: Tessalon Perles 100 mg 3 times daily as needed cough  Orders: -     Benzonatate; Take 1 capsule (100 mg total) by mouth 3 (three)  times daily as needed for cough.  Dispense: 20 capsule; Refill: 0  Polypharmacy Assessment & Plan: Discussed with pharmacist today Patient already received prepackaging Discussed with husband that he may need to take over managing her medications    Parkinson's disease without dyskinesia, unspecified whether manifestations fluctuate (HCC) Assessment & Plan: Been taking any medications Discussed with husband and with Royanne Core with pharmacy today.   CRI (chronic renal insufficiency), stage 3 (moderate) (HCC) Assessment & Plan: Continue HCTZ 12.5 mg once daily Discussed maintaining hydration   Medication management Assessment & Plan: Continue current medication Follow-up with Dr. Georgia Kipper as scheduled   Mild neurocognitive disorder, likely due to Parkinson's disease Assessment & Plan: Having trouble managing medications, discussed with husband and pharmacist today Already has prepackaging from her  pharmacy   Discussed at length with husband and patient the need for someone to go behind and make sure that she is taking her medications. Husband states that he will try to do this at home.  I spent 25 minutes on this patient encounter including counseling, diagnosis, treatment options, documentation, face-to-face time.   Meds ordered this encounter  Medications   benzonatate (TESSALON PERLES) 100 MG capsule    Sig: Take 1 capsule (100 mg total) by mouth 3 (three) times daily as needed for cough.    Dispense:  20 capsule    Refill:  0    Return if symptoms worsen or fail to improve, for as scheduled with Dr. Georgia Kipper.  Wellington Half, [name]

## 2023-07-18 NOTE — Assessment & Plan Note (Signed)
 Continue current medication Follow-up with Dr. Plotnikov as scheduled

## 2023-07-18 NOTE — Assessment & Plan Note (Signed)
 Having trouble managing medications, discussed with husband and pharmacist today Already has prepackaging from her pharmacy

## 2023-07-18 NOTE — Assessment & Plan Note (Signed)
 Discussed with pharmacist today Patient already received prepackaging Discussed with husband that he may need to take over managing her medications

## 2023-07-18 NOTE — Assessment & Plan Note (Signed)
 Continue HCTZ 12.5 mg once daily Discussed maintaining hydration

## 2023-07-18 NOTE — Assessment & Plan Note (Signed)
Tessalon Perles 100 mg 3 times daily as needed cough. 

## 2023-07-18 NOTE — Assessment & Plan Note (Signed)
 Been taking any medications Discussed with husband and with Royanne Core with pharmacy today.

## 2023-07-18 NOTE — Assessment & Plan Note (Signed)
 May need to restart loratadine  to help with allergy  symptoms

## 2023-07-22 ENCOUNTER — Other Ambulatory Visit: Admitting: Pharmacist

## 2023-07-22 DIAGNOSIS — Z79899 Other long term (current) drug therapy: Secondary | ICD-10-CM

## 2023-07-22 DIAGNOSIS — G3184 Mild cognitive impairment, so stated: Secondary | ICD-10-CM

## 2023-07-22 NOTE — Progress Notes (Addendum)
   07/22/2023 Name: Mallory Klein MRN: [id number] DOB: 09/17/1945  Chief Complaint  Patient presents with   Medication Adherence    [name] is a 78 y.o. year old female who presented for a telephone visit.   They were referred to the pharmacist by their PCP for assistance in managing medication access.   Pt has not been taking medications for about 3 weeks because she gets overwhelmed with how to take them. Her medications are already in pill packaging through [employer]. She had a home health nurse coming who would give her the meds to take but now she is no longer getting home health. She notes she has no one who can help her, however she does live with her husband.   Explained to patient over the phone that the packaging will have the labels to instruct her to take in AM vs PM. She still states she is unable to do it herself. She is asking if there is any option to have a nurse or someone come to the house to give her medications.  Will send to PCP for possible referral to be placed - unsure if home health is even an option in this case.  Have called and left a message for [facility] to see if they have any available resources for this type of situation. They gave me their number [phone] and asked for patient to give them a call and ask for Larinda Plover or Armin Landing.  Information above was given to patient along with my call back number.   Rainelle Bur, PharmD, BCPS, CPP Clinical Pharmacist Practitioner  Primary Care at [facility] [phone]   Medical screening examination/treatment/procedure(s) were performed by non-physician practitioner and as supervising physician I was immediately available for consultation/collaboration.  I agree with above. Adelaide Holy, MD

## 2023-07-26 NOTE — Patient Instructions (Signed)
 Please call [facility] at [phone] and ask for Larinda Plover or Armin Landing to see what resources they may have to help with your medications.  Rainelle Bur, PharmD, BCPS, CPP Clinical Pharmacist Practitioner [place] [facility] [phone]

## 2023-08-10 DIAGNOSIS — G20C Parkinsonism, unspecified: Secondary | ICD-10-CM | POA: Diagnosis not present

## 2023-09-24 ENCOUNTER — Telehealth: Payer: Self-pay | Admitting: Internal Medicine

## 2023-09-24 NOTE — Telephone Encounter (Signed)
 Copied from CRM [id number]. Topic: Referral - Request for Referral >> Sep 24, 2023 12:08 PM Suzen RAMAN wrote: Did the patient discuss referral with their provider in the last year? Yes (If No - schedule appointment) (If Yes - send message)  Appointment offered? Yes but patient declined  Type of order/referral and detailed reason for visit: Neurologist  Preference of office, provider, location: within the area but not  True Mar, MD([facility])  If referral order, have you been seen by this specialty before? Yes (If Yes, this issue or another issue? When? Where?  Can we respond through MyChart? Yes

## 2023-09-27 NOTE — Telephone Encounter (Signed)
  The patient was referred to a different neurologist in February per her request -she declined. She was seen Greig Forbes, NP and Dr Buck. -----------------------------------------  To: Ronal MARLA Orlando Walterine Ronal,   This is [place] with [facility]. Thank you for the referral for Ms. Lave. I wanted to reach out because I did reach out to her to see if she would like to schedule an appointment with our office. She actually advised that she was wanted a referral to a different neurologist. She did decline to schedule an appointment with our office.   Have a great day! . Type Date User Summary Attachment  General 04/15/2023  8:27 AM Harlan George Patient is currently admitted -  Note: Patient is currently admitted . Type Date User Summary Attachment  General 04/12/2023 11:15 AM Harlan George Left voicemail for patient. -  Note: Left voicemail for patient.   Patient is already est with our office and had an appt scheduled for 2/10 with [name] NP - patient had called to cancel that stating that she as being seen elsewhere. Req call back from pt to see if this referral was meant to come to us  and see if she wanted to r/s appt . Type Date User Summary Attachment  Provider Comments 04/06/2023 12:19 AM Nesbit Michon, Karlynn GAILS, MD Provider Comments -  Note: An appointment is requested in approximately: 4 weeks Please refer to Greig Forbes, NP -  [facility] Dx -low back pain, Parkinson disease Thanks

## 2023-10-05 ENCOUNTER — Telehealth: Payer: Self-pay | Admitting: Internal Medicine

## 2023-10-05 ENCOUNTER — Ambulatory Visit: Payer: Self-pay

## 2023-10-05 ENCOUNTER — Other Ambulatory Visit: Payer: Self-pay | Admitting: Internal Medicine

## 2023-10-05 NOTE — Telephone Encounter (Signed)
 FYI Only or Action Required?: Action required by provider: medication refill request and referral request.  Patient was last seen in primary care on 07/13/2023 by Alvia Corean CROME, [name].  Called Nurse Triage reporting Generalized Body Aches.  Symptoms began several weeks ago.  Interventions attempted: Prescription medications: prescription medications and Rest, hydration, or home remedies.  Symptoms are: unchanged.  Triage Disposition: See PCP Within 2 Weeks  Patient/caregiver understands and will follow disposition?: Yes     Copied from CRM [id number]. Topic: Clinical - Red Word Triage >> Oct 05, 2023 10:40 AM Rosina BIRCH wrote: Red Word that prompted transfer to Nurse Triage: whole body is in pain and she want some relief. Reason for Disposition  Body pains are a chronic symptom (recurrent or ongoing AND present > 4 weeks)  Answer Assessment - Initial Assessment Questions 1. ONSET: When did the muscle aches or body pains start?      Chronic pain that has gotten worse in last couple weeks  2. LOCATION: What part of your body is hurting? (e.g., entire body, arms, legs)      All over 3. SEVERITY: How bad is the pain? (Scale 1-10; or mild, moderate, severe)     10/10 4. CAUSE: What do you think is causing the pains?     Parkinson  5. FEVER: Do you have a fever? If Yes, ask: What is your temperature, how was it measured, and  when did it start?      no 6. OTHER SYMPTOMS: Do you have any other symptoms? (e.g., chest pain, cold or flu symptoms, rash, weakness, weight loss)     Swelling in BLE some times are slightly puffy  Protocols used: Muscle Aches and Body Pain-A-AH

## 2023-10-05 NOTE — Telephone Encounter (Signed)
 Ptis requesting a referral to a different neurologist.

## 2023-10-06 ENCOUNTER — Ambulatory Visit: Admitting: Internal Medicine

## 2023-10-06 ENCOUNTER — Encounter: Payer: Self-pay | Admitting: Internal Medicine

## 2023-10-06 VITALS — BP 139/80 | HR 74 | Temp 97.7°F | Ht 61.5 in

## 2023-10-06 DIAGNOSIS — G20A1 Parkinson's disease without dyskinesia, without mention of fluctuations: Secondary | ICD-10-CM | POA: Diagnosis not present

## 2023-10-06 DIAGNOSIS — G8929 Other chronic pain: Secondary | ICD-10-CM

## 2023-10-06 DIAGNOSIS — K219 Gastro-esophageal reflux disease without esophagitis: Secondary | ICD-10-CM

## 2023-10-06 DIAGNOSIS — E559 Vitamin D deficiency, unspecified: Secondary | ICD-10-CM

## 2023-10-06 DIAGNOSIS — D509 Iron deficiency anemia, unspecified: Secondary | ICD-10-CM

## 2023-10-06 DIAGNOSIS — M5440 Lumbago with sciatica, unspecified side: Secondary | ICD-10-CM | POA: Diagnosis not present

## 2023-10-06 DIAGNOSIS — E538 Deficiency of other specified B group vitamins: Secondary | ICD-10-CM | POA: Diagnosis not present

## 2023-10-06 DIAGNOSIS — N183 Chronic kidney disease, stage 3 unspecified: Secondary | ICD-10-CM | POA: Diagnosis not present

## 2023-10-06 LAB — CBC WITH DIFFERENTIAL/PLATELET
Basophils Absolute: 0 K/uL (ref 0.0–0.1)
Basophils Relative: 0.7 % (ref 0.0–3.0)
Eosinophils Absolute: 0.2 K/uL (ref 0.0–0.7)
Eosinophils Relative: 3.3 % (ref 0.0–5.0)
HCT: 31.8 % — ABNORMAL LOW (ref 36.0–46.0)
Hemoglobin: 10.4 g/dL — ABNORMAL LOW (ref 12.0–15.0)
Lymphocytes Relative: 36.9 % (ref 12.0–46.0)
Lymphs Abs: 2 K/uL (ref 0.7–4.0)
MCHC: 32.7 g/dL (ref 30.0–36.0)
MCV: 88.8 fl (ref 78.0–100.0)
Monocytes Absolute: 0.6 K/uL (ref 0.1–1.0)
Monocytes Relative: 11.1 % (ref 3.0–12.0)
Neutro Abs: 2.6 K/uL (ref 1.4–7.7)
Neutrophils Relative %: 48 % (ref 43.0–77.0)
Platelets: 372 K/uL (ref 150.0–400.0)
RBC: 3.59 Mil/uL — ABNORMAL LOW (ref 3.87–5.11)
RDW: 19.5 % — ABNORMAL HIGH (ref 11.5–15.5)
WBC: 5.3 K/uL (ref 4.0–10.5)

## 2023-10-06 LAB — COMPREHENSIVE METABOLIC PANEL WITH GFR
ALT: 8 U/L (ref 0–35)
AST: 17 U/L (ref 0–37)
Albumin: 3.5 g/dL (ref 3.5–5.2)
Alkaline Phosphatase: 56 U/L (ref 39–117)
BUN: 18 mg/dL (ref 6–23)
CO2: 34 meq/L — ABNORMAL HIGH (ref 19–32)
Calcium: 9.1 mg/dL (ref 8.4–10.5)
Chloride: 100 meq/L (ref 96–112)
Creatinine, Ser: 1.17 mg/dL (ref 0.40–1.20)
GFR: 44.91 mL/min — ABNORMAL LOW (ref >60.00)
Glucose, Bld: 94 mg/dL (ref 70–99)
Potassium: 3.6 meq/L (ref 3.5–5.1)
Sodium: 140 meq/L (ref 135–145)
Total Bilirubin: 0.5 mg/dL (ref 0.2–1.2)
Total Protein: 7.2 g/dL (ref 6.0–8.3)

## 2023-10-06 LAB — TSH: TSH: 0.95 u[IU]/mL (ref 0.35–5.50)

## 2023-10-06 LAB — VITAMIN B12: Vitamin B-12: 253 pg/mL (ref 211–911)

## 2023-10-06 LAB — IRON,TIBC AND FERRITIN PANEL
%SAT: 26 % (ref 16–45)
Ferritin: 10 ng/mL — ABNORMAL LOW (ref 16–288)
Iron: 101 ug/dL (ref 45–160)
TIBC: 382 ug/dL (ref 250–450)

## 2023-10-06 MED ORDER — TRAMADOL HCL 50 MG PO TABS: 50.0000 mg | ORAL_TABLET | Freq: Three times a day (TID) | ORAL | 3 refills | Status: DC | PRN

## 2023-10-06 NOTE — Telephone Encounter (Signed)
 Rx

## 2023-10-06 NOTE — Telephone Encounter (Signed)
 Karyn has been to two Neurology practices in town [facility] - Dr. Evonnie and [facility] - Dr. Buck). Where would she like me to refer her? Thanks

## 2023-10-06 NOTE — Assessment & Plan Note (Signed)
 Continue with current prescription therapy as reflected on the Med list.

## 2023-10-06 NOTE — Assessment & Plan Note (Signed)
 Tramadol prn  Potential benefits of a long term opioids use as well as potential risks (i.e. addiction risk, apnea etc) and complications (i.e. Somnolence, constipation and others) were explained to the patient and were aknowledged.

## 2023-10-06 NOTE — Progress Notes (Signed)
 Subjective:  Patient ID: Mallory Klein, female    DOB: Oct 05, 1945  Age: 78 y.o. MRN: [id number]  CC: Medical Management of Chronic Issues (Pt states she having body aches and is needing a referral to a new doctor for her parkinson disease )   HPI Mallory Klein presents for Parkinson's, LBP, leg pain  Outpatient Medications Prior to Visit  Medication Sig Dispense Refill   amLODipine  (NORVASC ) 5 MG tablet Take 1 tablet (5 mg total) by mouth daily. 90 tablet 3   cetirizine  (ZYRTEC ) 10 MG tablet TAKE ONE TABLET BY MOUTH EVERY DAY 90 tablet 3   Cholecalciferol (VITAMIN D3) 50 MCG (2000 UT) capsule Take 1 capsule (2,000 Units total) by mouth daily. 100 capsule 3   [facility] SENNA PLUS 8.6-50 MG tablet Take 1 tablet by mouth 2 (two) times daily.     Cyanocobalamin  (VITAMIN B-12) 5000 MCG SUBL One SL daily 100 tablet 3   docusate sodium (COLACE) 100 MG capsule Take 100 mg by mouth at bedtime.     furosemide  (LASIX ) 40 MG tablet Take 1 tablet (40 mg total) by mouth daily as needed. 30 tablet 5   hydrochlorothiazide  (MICROZIDE ) 12.5 MG capsule Take 1 capsule (12.5 mg total) by mouth daily. 90 capsule 3   methocarbamol  (ROBAXIN ) 500 MG tablet Take 1 tablet (500 mg total) by mouth every 8 (eight) hours as needed for muscle spasms. 60 tablet 2   Multiple Vitamin (MULTIVITAMIN WITH MINERALS) TABS tablet Take 1 tablet by mouth daily.     pantoprazole  (PROTONIX ) 40 MG tablet Take 1 tablet (40 mg total) by mouth 2 (two) times daily. 180 tablet 3   polyethylene glycol (MIRALAX  / GLYCOLAX ) 17 g packet Take 17 g by mouth daily as needed for mild constipation.     potassium chloride  SA (KLOR-CON  M) 20 MEQ tablet Take 1 tablet (20 mEq total) by mouth daily for 3 days. 90 tablet 3   Pramipexole  Dihydrochloride 0.75 MG TB24 Take 1 tablet (0.75 mg total) by mouth in the morning and at bedtime. 180 tablet 1   QUEtiapine  (SEROQUEL ) 100 MG tablet TAKE ONE TABLET BY MOUTH AT BEDTIME 90 tablet 1   rosuvastatin   (CRESTOR ) 10 MG tablet TAKE ONE TABLET BY MOUTH EVERY DAY 90 tablet 1   solifenacin  (VESICARE ) 5 MG tablet TAKE ONE TABLET BY MOUTH EVERY DAY 90 tablet 1   traMADol  (ULTRAM ) 50 MG tablet Take 1-2 tablets (50-100 mg total) by mouth every 8 (eight) hours as needed for severe pain (pain score 7-10). 180 tablet 3   diclofenac  Sodium (VOLTAREN ) 1 % GEL Apply 4 g topically daily as needed (pain). (Patient not taking: Reported on 10/06/2023)     hydrOXYzine  (VISTARIL ) 50 MG capsule Take 1 capsule (50 mg total) by mouth every 6 (six) hours as needed for anxiety. (Patient not taking: Reported on 10/06/2023)     benzonatate  (TESSALON  PERLES) 100 MG capsule Take 1 capsule (100 mg total) by mouth 3 (three) times daily as needed for cough. (Patient not taking: Reported on 10/06/2023) 20 capsule 0   No facility-administered medications prior to visit.    ROS: Review of Systems  Constitutional:  Positive for fatigue. Negative for activity change, appetite change, chills and unexpected weight change.  HENT:  Negative for congestion, mouth sores and sinus pressure.   Eyes:  Negative for visual disturbance.  Respiratory:  Negative for cough and chest tightness.   Gastrointestinal:  Positive for constipation. Negative for abdominal pain and nausea.  Genitourinary:  Negative for difficulty urinating, frequency and vaginal pain.  Musculoskeletal:  Positive for arthralgias, back pain and gait problem.  Skin:  Negative for pallor and rash.  Neurological:  Positive for tremors and weakness. Negative for dizziness, numbness and headaches.  Hematological:  Does not bruise/bleed easily.  Psychiatric/Behavioral:  Positive for decreased concentration and dysphoric mood. Negative for confusion, sleep disturbance and suicidal ideas. The patient is nervous/anxious.     Objective:  BP 139/80   Pulse 74   Temp 97.7 F (36.5 C) (Oral)   Ht 5' 1.5 (1.562 m)   SpO2 90%   BMI 25.95 kg/m   BP Readings from Last 3  Encounters:  10/06/23 139/80  07/13/23 (!) 140/88  07/09/23 (!) 166/109    Wt Readings from Last 3 Encounters:  07/13/23 139 lb 9.6 oz (63.3 kg)  06/25/23 149 lb (67.6 kg)  06/07/23 149 lb (67.6 kg)    Physical Exam Constitutional:      General: She is not in acute distress.    Appearance: She is well-developed. She is obese. She is not ill-appearing.  HENT:     Head: Normocephalic.     Right Ear: External ear normal.     Left Ear: External ear normal.     Nose: Nose normal.  Eyes:     General:        Right eye: No discharge.        Left eye: No discharge.     Conjunctiva/sclera: Conjunctivae normal.     Pupils: Pupils are equal, round, and reactive to light.  Neck:     Thyroid : No thyromegaly.     Vascular: No JVD.     Trachea: No tracheal deviation.  Cardiovascular:     Rate and Rhythm: Normal rate and regular rhythm.     Heart sounds: Normal heart sounds.  Pulmonary:     Effort: No respiratory distress.     Breath sounds: No stridor. No wheezing.  Abdominal:     General: Bowel sounds are normal. There is no distension.     Palpations: Abdomen is soft. There is no mass.     Tenderness: There is no abdominal tenderness. There is no guarding or rebound.  Musculoskeletal:        General: No tenderness.     Cervical back: Normal range of motion and neck supple. No rigidity.     Right lower leg: No edema.     Left lower leg: No edema.  Lymphadenopathy:     Cervical: No cervical adenopathy.  Skin:    Findings: No erythema or rash.  Neurological:     Mental Status: She is oriented to person, place, and time.     Cranial Nerves: No cranial nerve deficit.     Motor: Weakness present. No abnormal muscle tone.     Coordination: Coordination abnormal.     Gait: Gait abnormal.     Deep Tendon Reflexes: Reflexes normal.  Psychiatric:        Behavior: Behavior normal.        Thought Content: Thought content normal.        Judgment: Judgment normal.   Tremor In a  w/c stiff  Lab Results  Component Value Date   WBC 8.6 07/09/2023   HGB 10.2 (L) 07/09/2023   HCT 31.7 (L) 07/09/2023   PLT 375 07/09/2023   GLUCOSE 97 07/09/2023   CHOL 200 (H) 01/07/2021   TRIG 53 01/07/2021   HDL 79 01/07/2021  LDLDIRECT 109.8 02/10/2010   LDLCALC 111 (H) 01/07/2021   ALT 13 04/13/2023   AST 19 04/13/2023   NA 143 07/09/2023   K 4.0 07/09/2023   CL 104 07/09/2023   CREATININE 1.12 (H) 07/09/2023   BUN 18 07/09/2023   CO2 28 07/09/2023   TSH 0.55 06/25/2022   INR 1.2 04/12/2023   HGBA1C 6.1 06/25/2022    No results found.  Assessment & Plan:   Problem List Items Addressed This Visit     B12 deficiency   Re-start B12 Risks associated with treatment noncompliance were discussed. Compliance was encouraged.       Relevant Orders   Vitamin B12   CRI (chronic renal insufficiency), stage 3 (moderate) (HCC)   Hydrate well      Relevant Orders   TSH   GERD (gastroesophageal reflux disease)   Continue with current prescription therapy as reflected on the Med list.       Iron deficiency anemia   Relevant Orders   CBC with Differential/Platelet   Iron, TIBC and Ferritin Panel   Low back pain   Tramadol  prn  Potential benefits of a long term opioids use as well as potential risks (i.e. addiction risk, apnea etc) and complications (i.e. Somnolence, constipation and others) were explained to the patient and were aknowledged.      Parkinson disease (HCC) - Primary   F/u w/Dr Buck (wants to sch w/Dr Buck, not PA/NPs per the pt's request)   [facility] [address] [place] [address] [place]  [phone]      Relevant Orders   Ambulatory referral to Neurology   TSH   CBC with Differential/Platelet   Comprehensive metabolic panel with GFR   Iron, TIBC and Ferritin Panel   Vitamin D deficiency      No orders of the defined types were placed in this encounter.     Follow-up: No follow-ups on file.  Marolyn Noel, MD

## 2023-10-06 NOTE — Assessment & Plan Note (Signed)
 Hydrate well

## 2023-10-06 NOTE — Assessment & Plan Note (Signed)
 Re-start B12 Risks associated with treatment noncompliance were discussed. Compliance was encouraged.

## 2023-10-06 NOTE — Assessment & Plan Note (Addendum)
 F/u w/Dr Buck (wants to sch w/Dr Buck, not PA/NPs per the pt's request)   [facility] [address] [place] [address] [place]  [phone]

## 2023-10-06 NOTE — Patient Instructions (Signed)
 [address] [facility] [address] [place] [address] [place]  [phone]

## 2023-10-07 ENCOUNTER — Encounter: Payer: Self-pay | Admitting: Hematology

## 2023-10-07 ENCOUNTER — Telehealth: Payer: Self-pay

## 2023-10-07 ENCOUNTER — Other Ambulatory Visit (HOSPITAL_COMMUNITY): Payer: Self-pay

## 2023-10-07 NOTE — Telephone Encounter (Signed)
 Pharmacy Patient Advocate Encounter   Received notification from CoverMyMeds that prior authorization for traMADol  HCl 50MG  tablets is due for renewal.   Insurance verification completed.   The patient is insured through [facility].  Action: Refill too soon. PA is not needed at this time.SABRA Next eligible fill date is 10/12/2023.

## 2023-10-10 ENCOUNTER — Ambulatory Visit: Payer: Self-pay | Admitting: Internal Medicine

## 2023-10-20 ENCOUNTER — Encounter: Payer: Self-pay | Admitting: Internal Medicine

## 2023-10-20 ENCOUNTER — Ambulatory Visit (INDEPENDENT_AMBULATORY_CARE_PROVIDER_SITE_OTHER): Admitting: Internal Medicine

## 2023-10-20 ENCOUNTER — Ambulatory Visit: Payer: Self-pay

## 2023-10-20 VITALS — BP 126/70 | HR 80 | Temp 98.0°F | Ht 61.5 in | Wt 151.0 lb

## 2023-10-20 DIAGNOSIS — I10 Essential (primary) hypertension: Secondary | ICD-10-CM

## 2023-10-20 DIAGNOSIS — R600 Localized edema: Secondary | ICD-10-CM

## 2023-10-20 MED ORDER — VALSARTAN 40 MG PO TABS: 40.0000 mg | ORAL_TABLET | Freq: Every day | ORAL | 3 refills | Status: AC

## 2023-10-20 NOTE — Patient Instructions (Addendum)
    Have blood work done next week.    Medications changes include :   stop amlodipine .  Start valsartan  40 mg daily.  Take furosemide  (lasix )  40mg  daily x 2 days.      Return if symptoms worsen or fail to improve.

## 2023-10-20 NOTE — Telephone Encounter (Signed)
 FYI Only or Action Required?: FYI only for provider.  Patient was last seen in primary care on 10/06/2023 by Plotnikov, Karlynn GAILS, MD.  Called Nurse Triage reporting Foot Swelling.  Symptoms began several days ago.  Interventions attempted: Prescription medications: Furosemide .  Symptoms are: unchanged.  Triage Disposition: See Physician Within 24 Hours  Patient/caregiver understands and will follow disposition?: Yes    Copied from CRM [id number]. Topic: Clinical - Red Word Triage >> Oct 20, 2023 11:30 AM Macario HERO wrote: Red Word that prompted transfer to Nurse Triage: Patient said she has parkinson and both of her feet are swollen and ache. Reason for Disposition  [1] MODERATE leg swelling (e.g., swelling extends up to knees) AND [2] new-onset or getting worse  Answer Assessment - Initial Assessment Questions Patient stated over the weekend she noticed some slight swelling on both feet and legs, woke up today and it was worse, states the ache keeping her up at night. Taken Furosemide  but does not feel like its helping with the swelling. Methocarbamol . States she also needs refill on Tramdol.    1. ONSET: When did the swelling start? (e.g., minutes, hours, days)     A couple days ago   2. LOCATION: What part of the leg is swollen?  Are both legs swollen or just one leg?     Bilateral feet / legs  3. SEVERITY: How bad is the swelling? (e.g., localized; mild, moderate, severe)     Moderate  4. REDNESS: Is there redness or signs of infection?     Does not see any redness  6. FEVER: Do you have a fever? If Yes, ask: What is it, how was it measured, and when did it start?      No   7. CAUSE: What do you think is causing the leg swelling?     Unsure  8. MEDICAL HISTORY: Do you have a history of blood clots (e.g., DVT), cancer, heart failure, kidney disease, or liver failure?     No  9. RECURRENT SYMPTOM: Have you had leg swelling before? If Yes, ask: When  was the last time? What happened that time?     Yes, but not this bad  10. OTHER SYMPTOMS: Do you have any other symptoms? (e.g., chest pain, difficulty breathing)       No  Protocols used: Leg Swelling and Edema-A-AH

## 2023-10-20 NOTE — Progress Notes (Signed)
 Subjective:    Patient ID: Mallory Klein, female    DOB: January 01, 1946, 78 y.o.   MRN: [id number]      HPI Mallory Klein is here for  Chief Complaint  Patient presents with   Leg Swelling    Bilateral feet and leg swelling   Discussed the use of AI scribe software for clinical note transcription with the patient, who gave verbal consent to proceed.  History of Present Illness Mallory Klein is a 78 year old female with Parkinson's disease who presents with bilateral leg swelling. She is accompanied by her husband.  She has been experiencing persistent bilateral leg swelling for several weeks, which does not improve significantly overnight or with leg elevation during the day. No associated shortness of breath, cough, wheezing, or palpitations.  Her medical history includes Parkinson's disease, and she uses a walker for mobility. Her husband has made home modifications to accommodate her mobility needs.  Her current medications include hydrochlorothiazide  taken daily, furosemide  as needed which she has not taken in a while, and amlodipine  for blood pressure management.   Her husband manages most of the cooking, and he uses sea salt. There have been no recent dietary changes that could account for the swelling. She maintains a sedentary lifestyle, spending much of her day seated with her legs elevated while watching TV.  The swelling is very uncomfortable and has affected her sleep the past couple of days.   Medications and allergies reviewed with patient and updated if appropriate.  Current Outpatient Medications on File Prior to Visit  Medication Sig Dispense Refill   amLODipine  (NORVASC ) 5 MG tablet Take 1 tablet (5 mg total) by mouth daily. 90 tablet 3   cetirizine  (ZYRTEC ) 10 MG tablet TAKE ONE TABLET BY MOUTH EVERY DAY 90 tablet 3   Cholecalciferol (VITAMIN D3) 50 MCG (2000 UT) capsule Take 1 capsule (2,000 Units total) by mouth daily. 100 capsule 3   [facility] SENNA PLUS 8.6-50  MG tablet Take 1 tablet by mouth 2 (two) times daily.     Cyanocobalamin  (VITAMIN B-12) 5000 MCG SUBL One SL daily 100 tablet 3   diclofenac  Sodium (VOLTAREN ) 1 % GEL Apply 4 g topically daily as needed (pain).     docusate sodium (COLACE) 100 MG capsule Take 100 mg by mouth at bedtime.     furosemide  (LASIX ) 40 MG tablet Take 1 tablet (40 mg total) by mouth daily as needed. 30 tablet 5   hydrochlorothiazide  (MICROZIDE ) 12.5 MG capsule Take 1 capsule (12.5 mg total) by mouth daily. 90 capsule 3   hydrOXYzine  (VISTARIL ) 50 MG capsule Take 1 capsule (50 mg total) by mouth every 6 (six) hours as needed for anxiety.     methocarbamol  (ROBAXIN ) 500 MG tablet Take 1 tablet (500 mg total) by mouth every 8 (eight) hours as needed for muscle spasms. 60 tablet 2   Multiple Vitamin (MULTIVITAMIN WITH MINERALS) TABS tablet Take 1 tablet by mouth daily.     pantoprazole  (PROTONIX ) 40 MG tablet Take 1 tablet (40 mg total) by mouth 2 (two) times daily. 180 tablet 3   polyethylene glycol (MIRALAX  / GLYCOLAX ) 17 g packet Take 17 g by mouth daily as needed for mild constipation.     Pramipexole  Dihydrochloride 0.75 MG TB24 Take 1 tablet (0.75 mg total) by mouth in the morning and at bedtime. 180 tablet 1   QUEtiapine  (SEROQUEL ) 100 MG tablet TAKE ONE TABLET BY MOUTH AT BEDTIME 90 tablet 1   rosuvastatin  (CRESTOR )  10 MG tablet TAKE ONE TABLET BY MOUTH EVERY DAY 90 tablet 1   solifenacin  (VESICARE ) 5 MG tablet TAKE ONE TABLET BY MOUTH EVERY DAY 90 tablet 1   traMADol  (ULTRAM ) 50 MG tablet Take 1-2 tablets (50-100 mg total) by mouth every 8 (eight) hours as needed for severe pain (pain score 7-10). 180 tablet 3   potassium chloride  SA (KLOR-CON  M) 20 MEQ tablet Take 1 tablet (20 mEq total) by mouth daily for 3 days. 90 tablet 3   [DISCONTINUED] donepezil  (ARICEPT ) 5 MG tablet Take 1 tablet (5 mg total) by mouth at bedtime. 90 tablet 3   [DISCONTINUED] venlafaxine  XR (EFFEXOR -XR) 150 MG 24 hr capsule TAKE 1 CAPSULE BY  MOUTH  EVERY DAY WITH BREAKFAST 90 capsule 3   No current facility-administered medications on file prior to visit.    Review of Systems  Respiratory:  Negative for cough, shortness of breath and wheezing.   Cardiovascular:  Positive for leg swelling. Negative for chest pain and palpitations.       Objective:   Vitals:   10/20/23 1541  BP: 126/70  Pulse: 80  Temp: 98 F (36.7 C)  SpO2: 98%   BP Readings from Last 3 Encounters:  10/20/23 126/70  10/06/23 139/80  07/13/23 (!) 140/88   Wt Readings from Last 3 Encounters:  10/20/23 151 lb (68.5 kg)  07/13/23 139 lb 9.6 oz (63.3 kg)  06/25/23 149 lb (67.6 kg)   Body mass index is 28.07 kg/m.    Physical Exam Constitutional:      General: She is not in acute distress.    Appearance: Normal appearance. She is not ill-appearing.  HENT:     Head: Normocephalic and atraumatic.  Cardiovascular:     Rate and Rhythm: Normal rate and regular rhythm.  Pulmonary:     Effort: Pulmonary effort is normal.     Breath sounds: Normal breath sounds.  Musculoskeletal:     Right lower leg: Edema (1+ pitting) present.     Left lower leg: Edema (1+ pitting) present.  Skin:    General: Skin is warm and dry.     Findings: No erythema or rash.  Neurological:     Mental Status: She is alert.            Assessment & Plan:    Assessment and Plan Assessment & Plan Bilateral lower extremity edema Chronic edema with no improvement from leg elevation. Possible causes include sedentary lifestyle, medication side effects, and dietary salt intake.  No symptoms or physical findings consistent with cardiac, renal or liver cause - Discontinue amlodipine  as this may be contributing. - Initiate valsartan  40 mg daily.  She did not tolerate Benicar , but I think we need to try something different so hopefully she will tolerate this -Continue hydrochlorothiazide  12.5 mg daily - Take furosemide  40 mg daily for a couple of days to reduce fluid  retention, caution for increased urination and dehydration.  And take tomorrow and then skip a couple days and take another dose - Monitor blood pressure at home. - Check BMP in one week. - Discussed we may need to revise medications depending on leg swelling , how she is tolerating the medications in addition to her kidney function  Hypertension Hypertension well-controlled on current regimen. Amlodipine  may contribute to leg swelling.  - Discontinue amlodipine  and start valsartan  40 mg daily. -Continue hydrochlorothiazide  12.5 mg daily - Monitor blood pressure at home. - Reassess kidney function in one week.

## 2023-10-26 ENCOUNTER — Ambulatory Visit: Payer: Self-pay

## 2023-10-26 NOTE — Telephone Encounter (Signed)
 FYI Only or Action Required?: FYI only for provider.  Patient was last seen in primary care on 10/20/2023 by Geofm Glade PARAS, MD.  Called Nurse Triage reporting Leg Swelling.  Symptoms began several months ago.  Symptoms are: gradually worsening.  Triage Disposition: See Physician Within 24 Hours  Patient/caregiver understands and will follow disposition?: Yes       Copied from CRM #[id number]. Topic: Clinical - Red Word Triage >> Oct 26, 2023 11:32 AM Mia F wrote: Red Word that prompted transfer to Nurse Triage: Legs are swelling to where they are getting tight. Last night, pt was unable to sleep due to the tightness. She has seen Dr Garald for this already but she cannot tell if it is getting worse but it isn't getting ay better. She says she is very concerned about the tightness. She is also experiencing numbness in the legs.         Reason for Disposition  [1] MODERATE leg swelling (e.g., swelling extends up to knees) AND [2] new-onset or getting worse  Answer Assessment - Initial Assessment Questions 1. ONSET: When did the swelling start? (e.g., minutes, hours, days)     3-4 months, worsening recently  2. LOCATION: What part of the leg is swollen?  Are both legs swollen or just one leg?     Bilateral legs  3. SEVERITY: How bad is the swelling? (e.g., localized; mild, moderate, severe)     Moderate, the knee and below  4. REDNESS: Is there redness or signs of infection?     No 5. PAIN: Is the swelling painful to touch? If Yes, ask: How painful is it?   (Scale 1-10; mild, moderate or severe)     8/10 6. FEVER: Do you have a fever? If Yes, ask: What is it, how was it measured, and when did it start?      No 7. CAUSE: What do you think is causing the leg swelling?     Unsure  8. MEDICAL HISTORY: Do you have a history of blood clots (e.g., DVT), cancer, heart failure, kidney disease, or liver failure?     No 9. RECURRENT SYMPTOM: Have you had leg  swelling before? If Yes, ask: When was the last time? What happened that time?     Has been having similar issues for 4-5 months  10. OTHER SYMPTOMS: Do you have any other symptoms? (e.g., chest pain, difficulty breathing)       Some numbness in legs  Protocols used: Leg Swelling and Edema-A-AH

## 2023-10-27 ENCOUNTER — Other Ambulatory Visit (INDEPENDENT_AMBULATORY_CARE_PROVIDER_SITE_OTHER)

## 2023-10-27 ENCOUNTER — Encounter: Payer: Self-pay | Admitting: Internal Medicine

## 2023-10-27 ENCOUNTER — Ambulatory Visit: Admitting: Internal Medicine

## 2023-10-27 VITALS — BP 120/78 | HR 84 | Temp 98.2°F | Ht 61.5 in | Wt 151.0 lb

## 2023-10-27 DIAGNOSIS — G20A1 Parkinson's disease without dyskinesia, without mention of fluctuations: Secondary | ICD-10-CM | POA: Diagnosis not present

## 2023-10-27 DIAGNOSIS — E559 Vitamin D deficiency, unspecified: Secondary | ICD-10-CM | POA: Diagnosis not present

## 2023-10-27 DIAGNOSIS — F19982 Other psychoactive substance use, unspecified with psychoactive substance-induced sleep disorder: Secondary | ICD-10-CM

## 2023-10-27 DIAGNOSIS — K922 Gastrointestinal hemorrhage, unspecified: Secondary | ICD-10-CM | POA: Diagnosis not present

## 2023-10-27 DIAGNOSIS — I10 Essential (primary) hypertension: Secondary | ICD-10-CM | POA: Diagnosis not present

## 2023-10-27 DIAGNOSIS — R600 Localized edema: Secondary | ICD-10-CM | POA: Diagnosis not present

## 2023-10-27 DIAGNOSIS — R350 Frequency of micturition: Secondary | ICD-10-CM | POA: Diagnosis not present

## 2023-10-27 LAB — COMPREHENSIVE METABOLIC PANEL WITH GFR
ALT: 7 U/L (ref 0–35)
AST: 17 U/L (ref 0–37)
Albumin: 3.6 g/dL (ref 3.5–5.2)
Alkaline Phosphatase: 62 U/L (ref 39–117)
BUN: 20 mg/dL (ref 6–23)
CO2: 33 meq/L — ABNORMAL HIGH (ref 19–32)
Calcium: 8.7 mg/dL (ref 8.4–10.5)
Chloride: 101 meq/L (ref 96–112)
Creatinine, Ser: 1.28 mg/dL — ABNORMAL HIGH (ref 0.40–1.20)
GFR: 40.31 mL/min — ABNORMAL LOW (ref >60.00)
Glucose, Bld: 75 mg/dL (ref 70–99)
Potassium: 4.6 meq/L (ref 3.5–5.1)
Sodium: 140 meq/L (ref 135–145)
Total Bilirubin: 0.5 mg/dL (ref 0.2–1.2)
Total Protein: 7.4 g/dL (ref 6.0–8.3)

## 2023-10-27 LAB — CBC WITH DIFFERENTIAL/PLATELET
Basophils Absolute: 0.1 K/uL (ref 0.0–0.1)
Basophils Relative: 1.2 % (ref 0.0–3.0)
Eosinophils Absolute: 0.3 K/uL (ref 0.0–0.7)
Eosinophils Relative: 3.4 % (ref 0.0–5.0)
HCT: 31.1 % — ABNORMAL LOW (ref 36.0–46.0)
Hemoglobin: 9.9 g/dL — ABNORMAL LOW (ref 12.0–15.0)
Lymphocytes Relative: 29.4 % (ref 12.0–46.0)
Lymphs Abs: 2.3 K/uL (ref 0.7–4.0)
MCHC: 31.8 g/dL (ref 30.0–36.0)
MCV: 90.6 fl (ref 78.0–100.0)
Monocytes Absolute: 0.8 K/uL (ref 0.1–1.0)
Monocytes Relative: 9.9 % (ref 3.0–12.0)
Neutro Abs: 4.3 K/uL (ref 1.4–7.7)
Neutrophils Relative %: 56.1 % (ref 43.0–77.0)
Platelets: 339 K/uL (ref 150.0–400.0)
RBC: 3.43 Mil/uL — ABNORMAL LOW (ref 3.87–5.11)
RDW: 19.7 % — ABNORMAL HIGH (ref 11.5–15.5)
WBC: 7.7 K/uL (ref 4.0–10.5)

## 2023-10-27 LAB — BASIC METABOLIC PANEL WITH GFR
BUN: 20 mg/dL (ref 6–23)
CO2: 33 meq/L — ABNORMAL HIGH (ref 19–32)
Calcium: 8.7 mg/dL (ref 8.4–10.5)
Chloride: 101 meq/L (ref 96–112)
Creatinine, Ser: 1.28 mg/dL — ABNORMAL HIGH (ref 0.40–1.20)
GFR: 40.31 mL/min — ABNORMAL LOW (ref >60.00)
Glucose, Bld: 75 mg/dL (ref 70–99)
Potassium: 4.6 meq/L (ref 3.5–5.1)
Sodium: 140 meq/L (ref 135–145)

## 2023-10-27 MED ORDER — TRAZODONE HCL 50 MG PO TABS: 25.0000 mg | ORAL_TABLET | Freq: Every evening | ORAL | 5 refills | Status: AC | PRN

## 2023-10-27 NOTE — Progress Notes (Signed)
 Subjective:  Patient ID: Mallory Klein, female    DOB: Feb 16, 1946  Age: 78 y.o. MRN: [id number]  CC: Leg Swelling (Both legs swelling, feels tight; aches at night, wakes me out of my sleep; off and on for months)   HPI Mallory Klein presents for insomnia Falling asleep at 2-3 am watching TV  Nocturia q 2 hrs x 2; can't go back to sleep When awake - peeing normal Waking up at 6 am   Outpatient Medications Prior to Visit  Medication Sig Dispense Refill   cetirizine  (ZYRTEC ) 10 MG tablet TAKE ONE TABLET BY MOUTH EVERY DAY 90 tablet 3   Cholecalciferol (VITAMIN D3) 50 MCG (2000 UT) capsule Take 1 capsule (2,000 Units total) by mouth daily. 100 capsule 3   [facility] SENNA PLUS 8.6-50 MG tablet Take 1 tablet by mouth 2 (two) times daily.     Cyanocobalamin  (VITAMIN B-12) 5000 MCG SUBL One SL daily 100 tablet 3   diclofenac  Sodium (VOLTAREN ) 1 % GEL Apply 4 g topically daily as needed (pain).     docusate sodium (COLACE) 100 MG capsule Take 100 mg by mouth at bedtime.     furosemide  (LASIX ) 40 MG tablet Take 1 tablet (40 mg total) by mouth daily as needed. 30 tablet 5   hydrochlorothiazide  (MICROZIDE ) 12.5 MG capsule Take 1 capsule (12.5 mg total) by mouth daily. 90 capsule 3   hydrOXYzine  (VISTARIL ) 50 MG capsule Take 1 capsule (50 mg total) by mouth every 6 (six) hours as needed for anxiety.     methocarbamol  (ROBAXIN ) 500 MG tablet Take 1 tablet (500 mg total) by mouth every 8 (eight) hours as needed for muscle spasms. 60 tablet 2   Multiple Vitamin (MULTIVITAMIN WITH MINERALS) TABS tablet Take 1 tablet by mouth daily.     pantoprazole  (PROTONIX ) 40 MG tablet Take 1 tablet (40 mg total) by mouth 2 (two) times daily. 180 tablet 3   polyethylene glycol (MIRALAX  / GLYCOLAX ) 17 g packet Take 17 g by mouth daily as needed for mild constipation.     Pramipexole  Dihydrochloride 0.75 MG TB24 Take 1 tablet (0.75 mg total) by mouth in the morning and at bedtime. 180 tablet 1   QUEtiapine   (SEROQUEL ) 100 MG tablet TAKE ONE TABLET BY MOUTH AT BEDTIME 90 tablet 1   rosuvastatin  (CRESTOR ) 10 MG tablet TAKE ONE TABLET BY MOUTH EVERY DAY 90 tablet 1   solifenacin  (VESICARE ) 5 MG tablet TAKE ONE TABLET BY MOUTH EVERY DAY 90 tablet 1   traMADol  (ULTRAM ) 50 MG tablet Take 1-2 tablets (50-100 mg total) by mouth every 8 (eight) hours as needed for severe pain (pain score 7-10). 180 tablet 3   valsartan  (DIOVAN ) 40 MG tablet Take 1 tablet (40 mg total) by mouth daily. 90 tablet 3   potassium chloride  SA (KLOR-CON  M) 20 MEQ tablet Take 1 tablet (20 mEq total) by mouth daily for 3 days. (Patient not taking: Reported on 10/27/2023) 90 tablet 3   No facility-administered medications prior to visit.    ROS: Review of Systems  Constitutional:  Negative for activity change, appetite change, chills, fatigue and unexpected weight change.  HENT:  Negative for congestion, mouth sores and sinus pressure.   Eyes:  Negative for visual disturbance.  Respiratory:  Negative for cough and chest tightness.   Gastrointestinal:  Negative for abdominal pain and nausea.  Genitourinary:  Negative for difficulty urinating, frequency and vaginal pain.  Musculoskeletal:  Negative for back pain and gait problem.  Skin:  Negative for pallor and rash.  Neurological:  Negative for dizziness, tremors, weakness, numbness and headaches.  Psychiatric/Behavioral:  Negative for confusion and sleep disturbance.     Objective:  BP 120/78   Pulse 84   Temp 98.2 F (36.8 C)   Ht 5' 1.5 (1.562 m)   Wt 151 lb (68.5 kg)   SpO2 95%   BMI 28.07 kg/m   BP Readings from Last 3 Encounters:  10/27/23 120/78  10/20/23 126/70  10/06/23 139/80    Wt Readings from Last 3 Encounters:  10/27/23 151 lb (68.5 kg)  10/20/23 151 lb (68.5 kg)  07/13/23 139 lb 9.6 oz (63.3 kg)    Physical Exam Constitutional:      General: She is not in acute distress.    Appearance: She is well-developed.  HENT:     Head: Normocephalic.      Right Ear: External ear normal.     Left Ear: External ear normal.     Nose: Nose normal.  Eyes:     General:        Right eye: No discharge.        Left eye: No discharge.     Conjunctiva/sclera: Conjunctivae normal.     Pupils: Pupils are equal, round, and reactive to light.  Neck:     Thyroid : No thyromegaly.     Vascular: No JVD.     Trachea: No tracheal deviation.  Cardiovascular:     Rate and Rhythm: Normal rate and regular rhythm.     Heart sounds: Normal heart sounds.  Pulmonary:     Effort: No respiratory distress.     Breath sounds: No stridor. No wheezing.  Abdominal:     General: Bowel sounds are normal. There is no distension.     Palpations: Abdomen is soft. There is no mass.     Tenderness: There is no abdominal tenderness. There is no guarding or rebound.  Musculoskeletal:        General: No tenderness.     Cervical back: Normal range of motion and neck supple. No rigidity.     Right lower leg: No edema.     Left lower leg: No edema.  Lymphadenopathy:     Cervical: No cervical adenopathy.  Skin:    Findings: No erythema or rash.  Neurological:     Mental Status: Mental status is at baseline.     Cranial Nerves: No cranial nerve deficit.     Motor: No abnormal muscle tone.     Coordination: Coordination abnormal.     Gait: Gait abnormal.     Deep Tendon Reflexes: Reflexes normal.  Psychiatric:        Behavior: Behavior normal.        Thought Content: Thought content normal.        Judgment: Judgment normal.     Lab Results  Component Value Date   WBC 5.3 10/06/2023   HGB 10.4 (L) 10/06/2023   HCT 31.8 (L) 10/06/2023   PLT 372.0 10/06/2023   GLUCOSE 94 10/06/2023   CHOL 200 (H) 01/07/2021   TRIG 53 01/07/2021   HDL 79 01/07/2021   LDLDIRECT 109.8 02/10/2010   LDLCALC 111 (H) 01/07/2021   ALT 8 10/06/2023   AST 17 10/06/2023   NA 140 10/06/2023   K 3.6 10/06/2023   CL 100 10/06/2023   CREATININE 1.17 10/06/2023   BUN 18 10/06/2023    CO2 34 (H) 10/06/2023   TSH 0.95 10/06/2023  INR 1.2 04/12/2023   HGBA1C 6.1 06/25/2022    No results found.  Assessment & Plan:   Problem List Items Addressed This Visit     Insomnia disorder - Primary   Refractory. Use diuretics (Furosemide  and hydrochlorothiazide ) in the morning Try Trazodone  50-100 mg at 11 pm           Meds ordered this encounter  Medications   traZODone  (DESYREL ) 50 MG tablet    Sig: Take 0.5-1 tablets (25-50 mg total) by mouth at bedtime as needed for sleep.    Dispense:  60 tablet    Refill:  5      Follow-up: Return in about 3 months (around 01/26/2024) for a follow-up visit.  Marolyn Noel, MD

## 2023-10-27 NOTE — Assessment & Plan Note (Signed)
 Refractory. Use diuretics (Furosemide  and hydrochlorothiazide ) in the morning Try Trazodone  50-100 mg at 11 pm

## 2023-10-27 NOTE — Patient Instructions (Addendum)
 Use diuretics (Furosemide  and hydrochlorothiazide ) in the morning  Take Trazodone  50-100 mg at 11 pm

## 2023-10-28 ENCOUNTER — Ambulatory Visit: Payer: Self-pay | Admitting: Internal Medicine

## 2023-10-28 LAB — IRON,TIBC AND FERRITIN PANEL
%SAT: 42 % (ref 16–45)
Ferritin: 8 ng/mL — ABNORMAL LOW (ref 16–288)
Iron: 172 ug/dL — ABNORMAL HIGH (ref 45–160)
TIBC: 406 ug/dL (ref 250–450)

## 2023-10-28 LAB — TSH: TSH: 0.99 u[IU]/mL (ref 0.35–5.50)

## 2023-11-01 ENCOUNTER — Ambulatory Visit: Payer: Self-pay | Admitting: Internal Medicine

## 2023-11-01 ENCOUNTER — Ambulatory Visit: Payer: Self-pay

## 2023-11-01 NOTE — Telephone Encounter (Signed)
 This RN requested pt bring all of her medications and any lists she has to her appointment tomorrow. She is unable to verbalize correct medication list/dosage/instructions to this RN. Unclear if she is taking both diuretics. Advised to go to ED with worsening of symptoms or shortness of breath.   FYI Only or Action Required?: FYI only for provider.  Patient was last seen in primary care on 10/27/2023 by Plotnikov, Karlynn GAILS, MD.  Called Nurse Triage reporting No chief complaint on file..  Symptoms began chronic.  Interventions attempted: Prescription medications: see note.  Symptoms are: unchanged.  Triage Disposition: See Physician Within 24 Hours  Patient/caregiver understands and will follow disposition?: Yes  Reason for Disposition  [1] MODERATE leg swelling (e.g., swelling extends up to knees) AND [2] new-onset or getting worse  Answer Assessment - Initial Assessment Questions Aside from current medical complaint, pt would likely benefit from a medication review. She is very confused about which medications she is taking. States she had a visiting nurse help with medications in the past but that they were discontinued.  Patient reports she continues to have bilateral leg edema and pain. Was seen recently and advised to take tramadol . States pain does not improve with tramadol . Denies any difficulty breathing.   Patient states she was only taking one 50mg  tramadol  tablet at 8pm. This RN advised patient that she can take as directed (Take 1-2 tablets (50-100 mg total) by mouth every 8 (eight) hours as needed for severe pain (pain score 7-10)) per the newest October 06, 2023 Rx instructions. Patient read directions to this RN. This RN advised pt to call if she develops any constipation.   Visit note also states to continue lasix  and hydrochlorothiazide . This RN asked pt to check her medications to confirm if she is still taking both. She retrieved a list. Pt is confused about what she is  taking. Will bring all medications and lists to visit tomorrow.  Protocols used: Leg Swelling and Edema-A-AH Copied from CRM #[id number]. Topic: Clinical - Red Word Triage >> Nov 01, 2023 11:59 AM Shereese L wrote: [facility] that prompted transfer to Nurse Triage: Still having pain after taking medication traMADol  (ULTRAM ) 50 MG tablet Feet swollen and leg hurts and unable to move around

## 2023-11-02 ENCOUNTER — Ambulatory Visit (INDEPENDENT_AMBULATORY_CARE_PROVIDER_SITE_OTHER): Admitting: Internal Medicine

## 2023-11-02 ENCOUNTER — Encounter: Payer: Self-pay | Admitting: Internal Medicine

## 2023-11-02 VITALS — BP 130/86 | HR 90 | Ht 61.5 in | Wt 153.0 lb

## 2023-11-02 DIAGNOSIS — I10 Essential (primary) hypertension: Secondary | ICD-10-CM

## 2023-11-02 DIAGNOSIS — M25571 Pain in right ankle and joints of right foot: Secondary | ICD-10-CM

## 2023-11-02 DIAGNOSIS — F19982 Other psychoactive substance use, unspecified with psychoactive substance-induced sleep disorder: Secondary | ICD-10-CM

## 2023-11-02 DIAGNOSIS — M25572 Pain in left ankle and joints of left foot: Secondary | ICD-10-CM

## 2023-11-02 DIAGNOSIS — M2550 Pain in unspecified joint: Secondary | ICD-10-CM | POA: Insufficient documentation

## 2023-11-02 DIAGNOSIS — R600 Localized edema: Secondary | ICD-10-CM | POA: Diagnosis not present

## 2023-11-02 NOTE — Assessment & Plan Note (Signed)
 Trazodone  50 mg did not help you Try Trazodone  50 mg  2 or 3 tablets at bedtime

## 2023-11-02 NOTE — Patient Instructions (Addendum)
 Trazodone  50 mg did not help you  Try Trazodone  50 mg  2 or 3 tablets at bedtime  Wide Calf Compression Socks for Women Men,Large Plus Size Compression Socks Support for Nurse Pregnant 4.4 out of 5 stars 606 50+ bought in past month Price, product page$12.99 FREE delivery Sun, Sep 14 on $35 of items shipped by [employer] Or fastest delivery Tomorrow, Sep  Rosuvastatin  10 mg can make you hurt - stop it for 1-2 weeks to see if better  Use Tylenol  for pain

## 2023-11-02 NOTE — Assessment & Plan Note (Signed)
  Rosuvastatin  10 mg can make you hurt - stop it for 1-2 weeks to see if better  Use Tylenol  for pain

## 2023-11-02 NOTE — Progress Notes (Signed)
 Subjective:  Patient ID: Mallory Klein, female    DOB: 26-Jan-1946  Age: 78 y.o. MRN: [id number]  CC: Edema (Leg, Foot and soreness. Been happening for two weeks. Hurting and swelling all the time. Prescription has not helped at all. Elevation has not helped either. )   HPI Mallory Klein presents for insomnia Trazodone  50 mg did not help C/o being sore  Outpatient Medications Prior to Visit  Medication Sig Dispense Refill   cetirizine  (ZYRTEC ) 10 MG tablet TAKE ONE TABLET BY MOUTH EVERY DAY 90 tablet 3   hydrochlorothiazide  (MICROZIDE ) 12.5 MG capsule Take 1 capsule (12.5 mg total) by mouth daily. 90 capsule 3   pantoprazole  (PROTONIX ) 40 MG tablet Take 1 tablet (40 mg total) by mouth 2 (two) times daily. 180 tablet 3   potassium chloride  SA (KLOR-CON  M) 20 MEQ tablet Take 1 tablet (20 mEq total) by mouth daily for 3 days. 90 tablet 3   Pramipexole  Dihydrochloride 0.75 MG TB24 Take 1 tablet (0.75 mg total) by mouth in the morning and at bedtime. 180 tablet 1   QUEtiapine  (SEROQUEL ) 100 MG tablet TAKE ONE TABLET BY MOUTH AT BEDTIME 90 tablet 1   rosuvastatin  (CRESTOR ) 10 MG tablet TAKE ONE TABLET BY MOUTH EVERY DAY 90 tablet 1   solifenacin  (VESICARE ) 5 MG tablet TAKE ONE TABLET BY MOUTH EVERY DAY 90 tablet 1   traMADol  (ULTRAM ) 50 MG tablet Take 1-2 tablets (50-100 mg total) by mouth every 8 (eight) hours as needed for severe pain (pain score 7-10). 180 tablet 3   traZODone  (DESYREL ) 50 MG tablet Take 0.5-1 tablets (25-50 mg total) by mouth at bedtime as needed for sleep. 60 tablet 5   valsartan  (DIOVAN ) 40 MG tablet Take 1 tablet (40 mg total) by mouth daily. 90 tablet 3   Cholecalciferol (VITAMIN D3) 50 MCG (2000 UT) capsule Take 1 capsule (2,000 Units total) by mouth daily. (Patient not taking: Reported on 11/10/2023) 100 capsule 3   [facility] SENNA PLUS 8.6-50 MG tablet Take 1 tablet by mouth 2 (two) times daily. (Patient not taking: Reported on 11/10/2023)     Cyanocobalamin  (VITAMIN  B-12) 5000 MCG SUBL One SL daily (Patient not taking: Reported on 11/10/2023) 100 tablet 3   diclofenac  Sodium (VOLTAREN ) 1 % GEL Apply 4 g topically daily as needed (pain). (Patient not taking: Reported on 11/10/2023)     docusate sodium (COLACE) 100 MG capsule Take 100 mg by mouth at bedtime. (Patient not taking: Reported on 11/10/2023)     furosemide  (LASIX ) 40 MG tablet Take 1 tablet (40 mg total) by mouth daily as needed. (Patient not taking: Reported on 11/10/2023) 30 tablet 5   hydrOXYzine  (VISTARIL ) 50 MG capsule Take 1 capsule (50 mg total) by mouth every 6 (six) hours as needed for anxiety. (Patient not taking: Reported on 11/10/2023)     methocarbamol  (ROBAXIN ) 500 MG tablet Take 1 tablet (500 mg total) by mouth every 8 (eight) hours as needed for muscle spasms. (Patient not taking: Reported on 11/10/2023) 60 tablet 2   Multiple Vitamin (MULTIVITAMIN WITH MINERALS) TABS tablet Take 1 tablet by mouth daily. (Patient not taking: Reported on 11/10/2023)     polyethylene glycol (MIRALAX  / GLYCOLAX ) 17 g packet Take 17 g by mouth daily as needed for mild constipation. (Patient not taking: Reported on 11/10/2023)     No facility-administered medications prior to visit.    ROS: Review of Systems  Constitutional:  Positive for fatigue. Negative for activity change, appetite  change, chills and unexpected weight change.  HENT:  Negative for congestion, mouth sores and sinus pressure.   Eyes:  Negative for visual disturbance.  Respiratory:  Negative for cough and chest tightness.   Gastrointestinal:  Negative for abdominal pain and nausea.  Genitourinary:  Negative for difficulty urinating, frequency and vaginal pain.  Musculoskeletal:  Positive for arthralgias, back pain, gait problem and myalgias.  Skin:  Negative for pallor and rash.  Neurological:  Positive for tremors and weakness. Negative for dizziness, seizures, numbness and headaches.  Psychiatric/Behavioral:  Positive for decreased  concentration and sleep disturbance. Negative for confusion and suicidal ideas. The patient is nervous/anxious.     Objective:  BP 130/86   Pulse 90   Ht 5' 1.5 (1.562 m)   Wt 153 lb (69.4 kg)   SpO2 98%   BMI 28.44 kg/m   BP Readings from Last 3 Encounters:  11/10/23 (!) 146/78  11/02/23 130/86  10/27/23 120/78    Wt Readings from Last 3 Encounters:  11/02/23 153 lb (69.4 kg)  10/27/23 151 lb (68.5 kg)  10/20/23 151 lb (68.5 kg)    Physical Exam Constitutional:      General: She is not in acute distress.    Appearance: She is well-developed. She is obese. She is not toxic-appearing.  HENT:     Head: Normocephalic.     Right Ear: External ear normal.     Left Ear: External ear normal.     Nose: Nose normal.  Eyes:     General:        Right eye: No discharge.        Left eye: No discharge.     Conjunctiva/sclera: Conjunctivae normal.     Pupils: Pupils are equal, round, and reactive to light.  Neck:     Thyroid : No thyromegaly.     Vascular: No JVD.     Trachea: No tracheal deviation.  Cardiovascular:     Rate and Rhythm: Normal rate and regular rhythm.     Heart sounds: Normal heart sounds.  Pulmonary:     Effort: No respiratory distress.     Breath sounds: No stridor. No wheezing.  Abdominal:     General: Bowel sounds are normal. There is no distension.     Palpations: Abdomen is soft. There is no mass.     Tenderness: There is no abdominal tenderness. There is no guarding or rebound.  Musculoskeletal:        General: Tenderness present.     Cervical back: Normal range of motion and neck supple. No rigidity.     Right lower leg: Edema present.     Left lower leg: Edema present.  Lymphadenopathy:     Cervical: No cervical adenopathy.  Skin:    Findings: No erythema or rash.  Neurological:     Cranial Nerves: No cranial nerve deficit.     Motor: No abnormal muscle tone.     Coordination: Coordination normal.     Deep Tendon Reflexes: Reflexes normal.   Psychiatric:        Behavior: Behavior normal.        Thought Content: Thought content normal.        Judgment: Judgment normal.   Feet and calves w/soft non-pitting edema In a w/c tremor  Lab Results  Component Value Date   WBC 7.7 10/27/2023   HGB 9.9 (L) 10/27/2023   HCT 31.1 (L) 10/27/2023   PLT 339.0 10/27/2023   GLUCOSE 75 10/27/2023  GLUCOSE 75 10/27/2023   CHOL 200 (H) 01/07/2021   TRIG 53 01/07/2021   HDL 79 01/07/2021   LDLDIRECT 109.8 02/10/2010   LDLCALC 111 (H) 01/07/2021   ALT 7 10/27/2023   AST 17 10/27/2023   NA 140 10/27/2023   NA 140 10/27/2023   K 4.6 10/27/2023   K 4.6 10/27/2023   CL 101 10/27/2023   CL 101 10/27/2023   CREATININE 1.28 (H) 10/27/2023   CREATININE 1.28 (H) 10/27/2023   BUN 20 10/27/2023   BUN 20 10/27/2023   CO2 33 (H) 10/27/2023   CO2 33 (H) 10/27/2023   TSH 0.99 10/27/2023   INR 1.2 04/12/2023   HGBA1C 6.1 06/25/2022    No results found.  Assessment & Plan:   Problem List Items Addressed This Visit     Arthralgia    Rosuvastatin  10 mg can make you hurt - stop it for 1-2 weeks to see if better  Use Tylenol  for pain      Edema   Soft edema - ?lymphatic Feet and calves w/soft non-pitting edema      Essential hypertension   Hold Verapamil  due to swelling. Reduce dose      Insomnia disorder - Primary   Trazodone  50 mg did not help you Try Trazodone  50 mg  2 or 3 tablets at bedtime         No orders of the defined types were placed in this encounter.     Follow-up: Return in about 6 weeks (around 12/14/2023) for a follow-up visit.  Marolyn Noel, MD

## 2023-11-02 NOTE — Assessment & Plan Note (Addendum)
 Soft edema - ?lymphatic Feet and calves w/soft non-pitting edema

## 2023-11-02 NOTE — Assessment & Plan Note (Signed)
 Hold Verapamil due to swelling. Reduce dose

## 2023-11-10 ENCOUNTER — Ambulatory Visit: Admitting: Internal Medicine

## 2023-11-10 ENCOUNTER — Encounter: Payer: Self-pay | Admitting: Internal Medicine

## 2023-11-10 VITALS — BP 146/78 | HR 87 | Temp 98.5°F | Ht 61.5 in

## 2023-11-10 DIAGNOSIS — H6123 Impacted cerumen, bilateral: Secondary | ICD-10-CM

## 2023-11-10 DIAGNOSIS — J309 Allergic rhinitis, unspecified: Secondary | ICD-10-CM

## 2023-11-10 DIAGNOSIS — H9311 Tinnitus, right ear: Secondary | ICD-10-CM | POA: Diagnosis not present

## 2023-11-10 DIAGNOSIS — G20A1 Parkinson's disease without dyskinesia, without mention of fluctuations: Secondary | ICD-10-CM | POA: Diagnosis not present

## 2023-11-10 DIAGNOSIS — I10 Essential (primary) hypertension: Secondary | ICD-10-CM

## 2023-11-10 NOTE — Assessment & Plan Note (Signed)
 D/w pt, exam o/w benign, declines repeat brain mri for now

## 2023-11-10 NOTE — Assessment & Plan Note (Signed)
 Cleared with treatment today

## 2023-11-10 NOTE — Patient Instructions (Addendum)
 Your ears were cleared today  Please consider taking the OTC Allegra and Nasacort  for allergies  Please continue all other medications as before  Please have the pharmacy call with any other refills you may need.  Please keep your appointments with your specialists as you may have planned  You will be contacted regarding the referral for: Neurology Dr Tat  No lab work needed today

## 2023-11-10 NOTE — Assessment & Plan Note (Signed)
 BP Readings from Last 3 Encounters:  11/10/23 (!) 146/78  11/02/23 130/86  10/27/23 120/78   Uncontrolled, likely reactive, pt to continue medical treatment hct 12.5 mg every day, diovan  40 mg every day, declines other change

## 2023-11-10 NOTE — Progress Notes (Signed)
 Patient ID: Mallory Klein, female   DOB: 03/29/1945, 78 y.o.   MRN: [id number]        Chief Complaint: follow up bilateral cerumen impaction, PD, right ear tinnitus, and allergies       HPI:  Mallory Klein is a 78 y.o. female here with c/o loud right ear ringing for the past wk intermittent but no hearing loss except for cerumen impactions bilateral today.  Does have several wks ongoing nasal allergy  symptoms with clearish congestion, itch and sneezing, without fever, pain, ST, cough, swelling or wheezing.  Pt has obvious worsening mostly RUE tremor with PD, but Pt wants to change neurology as she only sees Apps there, has been lost to f/u since aug 2024.     Wt Readings from Last 3 Encounters:  11/02/23 153 lb (69.4 kg)  10/27/23 151 lb (68.5 kg)  10/20/23 151 lb (68.5 kg)   BP Readings from Last 3 Encounters:  11/10/23 (!) 146/78  11/02/23 130/86  10/27/23 120/78         Past Medical History:  Diagnosis Date   Abnormal CBC 02/05/2015   Dr Lanny 12/16 new: normocytic anemia and thrombocytosis   Adjustment disorder with mixed anxiety and depressed mood 02/15/2007   Chronic  Chronic pain Grief, stress Effexor  XR  Dad died in February 18, 2024   Asthma    Diabetes mellitus without complication (HCC)    Diverticulosis of colon (without mention of hemorrhage)    Esophageal stricture    Essential hypertension 01/26/2007   Chronic Verapamil     Falls    I blackout   GERD (gastroesophageal reflux disease) 02/15/2007   Chronic     Heart murmur    Hyperglycemia 03/02/2011   Mild     Insomnia disorder 12/08/2018   10/20 Carbid/Lev dose was increased - c/o hard time falling asleep (6 am) getting up at 12 am, poor sleep. Try Temazepam  15-30 mg at 11-1:30 pm    Internal hemorrhoids without mention of complication    Iron deficiency anemia 04/23/2017   Mild neurocognitive disorder, likely due to Parkinson's disease 02/09/2019   OSA (obstructive sleep apnea) 02/14/2010   In lab study (May  2018): AHI 25. Only had 45 minutes of sleep secondary to frequent awakenings secondary to sleep apnea. autocpap 5-15 cm water.    Osteoarthritis    Parkinson disease (HCC) 2014/02/17   2015 2017  Primidone  - d/c 2019 Parkinson's: [facility] IR. Dr Tat   Postherpetic trigeminal neuralgia 01/26/2007   Qualifier: Diagnosis of  By: Wilhemina RMA, [name]     Shortness of breath dyspnea    with allergies   Shoulder contusion 07/31/2020   severe R shoulder pain after a fall on 07/20/20, neck pain. There was a LOC. Pt went to ER 4 timeas. All CTs,X rays were (-)     Sleep apnea    Syncope and collapse 08/06/2020   Remote syncope on 07/20/2020.  Possible recurrent.  Cardiology consultation     Thrombocytosis 05/24/2017   Urinary incontinence 01/26/2007   Chronic  10/17 [place]  [place]    Vitamin D deficiency 01/26/2019   Past Surgical History:  Procedure Laterality Date   ABDOMINAL HYSTERECTOMY     BIOPSY  10/19/2022   Procedure: BIOPSY;  Surgeon: Abran Norleen SAILOR, MD;  Location: [facility] ENDOSCOPY;  Service: Gastroenterology;;   BREAST CYST EXCISION Left 10/25/1978   COLONOSCOPY     Multiple   COLONOSCOPY WITH PROPOFOL  N/A 04/15/2023   Procedure: COLONOSCOPY WITH PROPOFOL ;  Surgeon: Federico,  Rosario BROCKS, MD;  Location: [facility] ENDOSCOPY;  Service: Gastroenterology;  Laterality: N/A;   ESOPHAGOGASTRODUODENOSCOPY (EGD) WITH PROPOFOL  N/A 10/19/2022   Procedure: ESOPHAGOGASTRODUODENOSCOPY (EGD) WITH PROPOFOL ;  Surgeon: Abran Norleen SAILOR, MD;  Location: [facility] ENDOSCOPY;  Service: Gastroenterology;  Laterality: N/A;   ESOPHAGOGASTRODUODENOSCOPY (EGD) WITH PROPOFOL  N/A 04/15/2023   Procedure: ESOPHAGOGASTRODUODENOSCOPY (EGD) WITH PROPOFOL ;  Surgeon: Federico Rosario BROCKS, MD;  Location: [facility] ENDOSCOPY;  Service: Gastroenterology;  Laterality: N/A;   HOT HEMOSTASIS N/A 04/15/2023   Procedure: HOT HEMOSTASIS (ARGON PLASMA COAGULATION/BICAP);  Surgeon: Federico Rosario BROCKS, MD;  Location: [facility] ENDOSCOPY;  Service: Gastroenterology;  Laterality: N/A;   LUMBAR  LAMINECTOMY/DECOMPRESSION MICRODISCECTOMY Bilateral 02/20/2014   Procedure: LUMBAR TWO THREE, LUMBAR THREE FOUR LUMBAR LAMINECTOMY/DECOMPRESSION MICRODISCECTOMY 2 LEVELS;  Surgeon: Alm GORMAN Molt, MD;  Location: [facility] NEURO ORS;  Service: Neurosurgery;  Laterality: Bilateral;   SPLENECTOMY     TIBIA FRACTURE SURGERY Right    TIBIA FRACTURE SURGERY Left    TOTAL KNEE ARTHROPLASTY Right 02/24/2011    reports that she has never smoked. She has never used smokeless tobacco. She reports current alcohol use. She reports that she does not use drugs. family history includes Asthma in her mother; Dementia in her mother; Diabetes in her mother; Diabetes type II in her mother; Hypertension in her mother; Mental illness in her father; Prostate cancer in her father; Stroke in her brother; Suicidality in her son. Allergies  Allergen Reactions   Ace Inhibitors     Patient doesn't recall reaction.  jkl   Benicar  [Olmesartan ]     It made her sick   Aspirin Other (See Comments)    bruising   Citalopram Hydrobromide Diarrhea   Current Outpatient Medications on File Prior to Visit  Medication Sig Dispense Refill   cetirizine  (ZYRTEC ) 10 MG tablet TAKE ONE TABLET BY MOUTH EVERY DAY 90 tablet 3   hydrochlorothiazide  (MICROZIDE ) 12.5 MG capsule Take 1 capsule (12.5 mg total) by mouth daily. 90 capsule 3   pantoprazole  (PROTONIX ) 40 MG tablet Take 1 tablet (40 mg total) by mouth 2 (two) times daily. 180 tablet 3   potassium chloride  SA (KLOR-CON  M) 20 MEQ tablet Take 1 tablet (20 mEq total) by mouth daily for 3 days. 90 tablet 3   Pramipexole  Dihydrochloride 0.75 MG TB24 Take 1 tablet (0.75 mg total) by mouth in the morning and at bedtime. 180 tablet 1   QUEtiapine  (SEROQUEL ) 100 MG tablet TAKE ONE TABLET BY MOUTH AT BEDTIME 90 tablet 1   rosuvastatin  (CRESTOR ) 10 MG tablet TAKE ONE TABLET BY MOUTH EVERY DAY 90 tablet 1   solifenacin  (VESICARE ) 5 MG tablet TAKE ONE TABLET BY MOUTH EVERY DAY 90 tablet 1   traMADol   (ULTRAM ) 50 MG tablet Take 1-2 tablets (50-100 mg total) by mouth every 8 (eight) hours as needed for severe pain (pain score 7-10). 180 tablet 3   traZODone  (DESYREL ) 50 MG tablet Take 0.5-1 tablets (25-50 mg total) by mouth at bedtime as needed for sleep. 60 tablet 5   valsartan  (DIOVAN ) 40 MG tablet Take 1 tablet (40 mg total) by mouth daily. 90 tablet 3   Cholecalciferol (VITAMIN D3) 50 MCG (2000 UT) capsule Take 1 capsule (2,000 Units total) by mouth daily. (Patient not taking: Reported on 11/10/2023) 100 capsule 3   [employer] SENNA PLUS 8.6-50 MG tablet Take 1 tablet by mouth 2 (two) times daily. (Patient not taking: Reported on 11/10/2023)     Cyanocobalamin  (VITAMIN B-12) 5000 MCG SUBL One SL daily (Patient not taking:  Reported on 11/10/2023) 100 tablet 3   diclofenac  Sodium (VOLTAREN ) 1 % GEL Apply 4 g topically daily as needed (pain). (Patient not taking: Reported on 11/10/2023)     docusate sodium (COLACE) 100 MG capsule Take 100 mg by mouth at bedtime. (Patient not taking: Reported on 11/10/2023)     furosemide  (LASIX ) 40 MG tablet Take 1 tablet (40 mg total) by mouth daily as needed. (Patient not taking: Reported on 11/10/2023) 30 tablet 5   hydrOXYzine  (VISTARIL ) 50 MG capsule Take 1 capsule (50 mg total) by mouth every 6 (six) hours as needed for anxiety. (Patient not taking: Reported on 11/10/2023)     methocarbamol  (ROBAXIN ) 500 MG tablet Take 1 tablet (500 mg total) by mouth every 8 (eight) hours as needed for muscle spasms. (Patient not taking: Reported on 11/10/2023) 60 tablet 2   Multiple Vitamin (MULTIVITAMIN WITH MINERALS) TABS tablet Take 1 tablet by mouth daily. (Patient not taking: Reported on 11/10/2023)     polyethylene glycol (MIRALAX  / GLYCOLAX ) 17 g packet Take 17 g by mouth daily as needed for mild constipation. (Patient not taking: Reported on 11/10/2023)     [DISCONTINUED] donepezil  (ARICEPT ) 5 MG tablet Take 1 tablet (5 mg total) by mouth at bedtime. 90 tablet 3   [DISCONTINUED]  venlafaxine  XR (EFFEXOR -XR) 150 MG 24 hr capsule TAKE 1 CAPSULE BY MOUTH  EVERY DAY WITH BREAKFAST 90 capsule 3   No current facility-administered medications on file prior to visit.        ROS:  All others reviewed and negative.  Objective        PE:  BP (!) 146/78   Pulse 87   Temp 98.5 F (36.9 C)   Ht 5' 1.5 (1.562 m)   SpO2 93%   BMI 28.44 kg/m                 Constitutional: Pt appears in NAD               HENT: Head: NCAT.                Right Ear: External ear normal.                 Left Ear: External ear normal.                Eyes: . Pupils are equal, round, and reactive to light. Conjunctivae and EOM are normal               Nose: without d/c or deformity               Neck: Neck supple. Gross normal ROM               Cardiovascular: Normal rate and regular rhythm.                 Pulmonary/Chest: Effort normal and breath sounds without rales or wheezing.                Abd:  Soft, NT, ND, + BS, no organomegaly               Neurological: Pt is alert. At baseline orientation, motor grossly intact but has large tremor RUE               Skin: Skin is warm. No rashes, no other new lesions, LE edema - none               Psychiatric: Pt behavior  is normal without agitation   Micro: none  Cardiac tracings I have personally interpreted today:  none  Pertinent Radiological findings (summarize): none   Lab Results  Component Value Date   WBC 7.7 10/27/2023   HGB 9.9 (L) 10/27/2023   HCT 31.1 (L) 10/27/2023   PLT 339.0 10/27/2023   GLUCOSE 75 10/27/2023   GLUCOSE 75 10/27/2023   CHOL 200 (H) 01/07/2021   TRIG 53 01/07/2021   HDL 79 01/07/2021   LDLDIRECT 109.8 02/10/2010   LDLCALC 111 (H) 01/07/2021   ALT 7 10/27/2023   AST 17 10/27/2023   NA 140 10/27/2023   NA 140 10/27/2023   K 4.6 10/27/2023   K 4.6 10/27/2023   CL 101 10/27/2023   CL 101 10/27/2023   CREATININE 1.28 (H) 10/27/2023   CREATININE 1.28 (H) 10/27/2023   BUN 20 10/27/2023   BUN 20  10/27/2023   CO2 33 (H) 10/27/2023   CO2 33 (H) 10/27/2023   TSH 0.99 10/27/2023   INR 1.2 04/12/2023   HGBA1C 6.1 06/25/2022   Assessment/Plan:  Mallory Klein is a 78 y.o. Black or African American [2] female with  has a past medical history of Abnormal CBC (02/05/2015), Adjustment disorder with mixed anxiety and depressed mood (02/15/2007), Asthma, Diabetes mellitus without complication (HCC), Diverticulosis of colon (without mention of hemorrhage), Esophageal stricture, Essential hypertension (01/26/2007), Falls, GERD (gastroesophageal reflux disease) (02/15/2007), Heart murmur, Hyperglycemia (03/02/2011), Insomnia disorder (12/08/2018), Internal hemorrhoids without mention of complication, Iron deficiency anemia (04/23/2017), Mild neurocognitive disorder, likely due to Parkinson's disease (02/09/2019), OSA (obstructive sleep apnea) (02/14/2010), Osteoarthritis, Parkinson disease (HCC) (02/09/2014), Postherpetic trigeminal neuralgia (01/26/2007), Shortness of breath dyspnea, Shoulder contusion (07/31/2020), Sleep apnea, Syncope and collapse (08/06/2020), Thrombocytosis (05/24/2017), Urinary incontinence (01/26/2007), and Vitamin D deficiency (01/26/2019).  Parkinson disease (HCC) Pt with 3 mo worsening tremor, ok for neurology referral Dr Tat  Bilateral impacted cerumen Cleared with treatment today  Essential hypertension BP Readings from Last 3 Encounters:  11/10/23 (!) 146/78  11/02/23 130/86  10/27/23 120/78   Uncontrolled, likely reactive, pt to continue medical treatment hct 12.5 mg every day, diovan  40 mg every day, declines other change   Tinnitus aurium, right D/w pt, exam o/w benign, declines repeat brain mri for now  Allergic rhinitis Mild to mod, for otc allegra and nasacort  asd,  to f/u any worsening symptoms or concerns  Followup: Return if symptoms worsen or fail to improve.  Lynwood Rush, MD 11/10/2023 7:05 PM [facility] -  [facility]

## 2023-11-10 NOTE — Assessment & Plan Note (Signed)
Mild to mod, for otc allegra and nasacort asd,  to f/u any worsening symptoms or concerns

## 2023-11-10 NOTE — Assessment & Plan Note (Signed)
 Pt with 3 mo worsening tremor, ok for neurology referral Dr Tat

## 2023-11-14 ENCOUNTER — Encounter: Payer: Self-pay | Admitting: Internal Medicine

## 2023-11-19 ENCOUNTER — Ambulatory Visit: Payer: Self-pay

## 2023-11-19 ENCOUNTER — Telehealth: Admitting: Nurse Practitioner

## 2023-11-19 NOTE — Telephone Encounter (Signed)
 FYI Only or Action Required?: FYI only for provider.  Patient was last seen in primary care on 11/10/2023 by Norleen Lynwood ORN, MD.  Called Nurse Triage reporting Pain.  Symptoms began several weeks ago.  Interventions attempted: Nothing.  Symptoms are: gradually worsening.  Triage Disposition: See PCP When Office is Open (Within 3 Days)  Patient/caregiver understands and will follow disposition?: Unsure Pt seen in office on 09/17, said that she was in pain then, but didn't want to let theprovider know at this time. Pt says that she is in pain likely due to history of Parkinsons. RN advising visit, pt opting for virtual visit instead.  Pt expressing concerns with assessing her virtual visit, says that she will get her husband to help.  Rn again recommended in office visit for Monday, but pt prefers to be seen today with the hope to get something for pain  Copied from CRM #[id number]. Topic: Clinical - Red Word Triage >> Nov 19, 2023 10:28 AM Robinson H wrote: [facility] that prompted transfer to Nurse Triage: Has Parkinson in pain can't sleep during the day can't hardly move wants to know if Dr. Garald can call her in something for pain, pain getting worse legs and all over hurt and didn't used to be like that Reason for Disposition  [1] MODERATE pain (e.g., interferes with normal activities) AND [2] present > 3 days  Answer Assessment - Initial Assessment Questions 1. ONSET: When did the muscle aches or body pains start?      Pain has been getting worse in the last year  2. LOCATION: What part of your body is hurting? (e.g., entire body, arms, legs)      Legs, hips, and various spots all over  3. SEVERITY: How bad is the pain? (Scale 1-10; or mild, moderate, severe)     Moderate pain  4. CAUSE: What do you think is causing the pains?     Unsure of cause. Thinks it may have to do with her history of Parkinson Disease  5. FEVER: Do you have a fever? If Yes, ask: What is your  temperature, how was it measured, and  when did it start?      No  6. OTHER SYMPTOMS: Do you have any other symptoms? (e.g., chest pain, cold or flu symptoms, rash, weakness, weight loss)     Feet swelling  7. PREGNANCY: Is there any chance you are pregnant? When was your last menstrual period?     No  8. TRAVEL: Have you traveled out of the country in the last month? (e.g., exposures, travel history)     No  Protocols used: Muscle Aches and Body Pain-A-AH

## 2023-11-19 NOTE — Progress Notes (Signed)
 Patient requested to reschedule due to not having video capabilities at home.  Scheduled to see Corean Ku on Monday at 3:40pm

## 2023-11-19 NOTE — Telephone Encounter (Signed)
 FYI Only or Action Required?: Action required by provider: medication refill request. Pt requesting pain medication be called in. Unable to have VV d/t computer issue today.  Patient was last seen in primary care on 11/19/2023 by Elnor Lauraine BRAVO, NP.  Called Nurse Triage reporting Advice Only.  Symptoms began several weeks ago.  Interventions attempted: Nothing.  Symptoms are: unchanged.from earlier today.  Triage Disposition: No disposition on file.  Patient/caregiver understands and will follow disposition?:                    Copied from CRM [id number]. Topic: Clinical - Red Word Triage >> Nov 19, 2023  3:57 PM Thersia BROCKS wrote: [facility] that prompted transfer to Nurse Triage: Patient was triaged earlier for pain and was scheduled to see provider virtually, patient stated she may not be able to make appointment due to computer troubles, wanted to know what else is it that they can do for her pain before the weekend Reason for Disposition  [1] Prescription refill request for ESSENTIAL medicine (i.e., likelihood of harm to patient if not taken) AND [2] triager unable to refill per department policy  Answer Assessment - Initial Assessment Questions Pt called back she was unable to have a VV d/t computer issues. Advised UC for care today. Pt states that she does not want to go to UC. She is requesting pain medication be called in for her.      1. DRUG NAME: What medicine do you need to have refilled?     Pain medication 2. REFILLS REMAINING: How many refills are remaining? Notes: The label on the medicine or pill bottle will show how many refills are remaining. If there are no refills remaining, then a renewal may be needed.     na 6. SYMPTOMS: Do you have any symptoms?     Pain  Protocols used: Medication Refill and Renewal Call-A-AH

## 2023-11-22 ENCOUNTER — Ambulatory Visit (INDEPENDENT_AMBULATORY_CARE_PROVIDER_SITE_OTHER): Admitting: Family Medicine

## 2023-11-22 ENCOUNTER — Encounter: Payer: Self-pay | Admitting: Family Medicine

## 2023-11-22 VITALS — BP 142/78 | HR 72 | Temp 97.7°F | Ht 61.5 in

## 2023-11-22 DIAGNOSIS — M79605 Pain in left leg: Secondary | ICD-10-CM | POA: Diagnosis not present

## 2023-11-22 DIAGNOSIS — H9311 Tinnitus, right ear: Secondary | ICD-10-CM | POA: Diagnosis not present

## 2023-11-22 DIAGNOSIS — G20B2 Parkinson's disease with dyskinesia, with fluctuations: Secondary | ICD-10-CM

## 2023-11-22 DIAGNOSIS — Z79899 Other long term (current) drug therapy: Secondary | ICD-10-CM

## 2023-11-22 DIAGNOSIS — M79604 Pain in right leg: Secondary | ICD-10-CM

## 2023-11-22 DIAGNOSIS — G8929 Other chronic pain: Secondary | ICD-10-CM | POA: Diagnosis not present

## 2023-11-22 MED ORDER — TRAMADOL HCL 50 MG PO TABS: 50.0000 mg | ORAL_TABLET | Freq: Three times a day (TID) | ORAL | 0 refills | Status: AC | PRN

## 2023-11-22 NOTE — Progress Notes (Signed)
 Acute Office Visit  Subjective:     Patient ID: Mallory Klein, female    DOB: [birth year], 78 y.o.   MRN: [id number]  No chief complaint on file.   HPI  Discussed the use of AI scribe software for clinical note transcription with the patient, who gave verbal consent to proceed.  History of Present Illness Mallory Klein is a 78 year old female with Parkinson's disease who presents with leg pain and tinnitus. She is accompanied by Lynwood Marts, who is acting as historian, husband.  Lower extremity pain and impaired mobility - Intermittent pain primarily in the feet, legs, and knees - Pain described as 'just moves around' - Tramadol  provides pain relief; requires refill - Occasional use of Tylenol  when other medications are unavailable - Mobility is limited; uses a walker for ambulation  Tinnitus - Onset a few weeks ago - Characterized by a loud noise similar to 'bells' - Noise is intermittent and not pulsatile - Ear cleaning has reduced but not resolved the tinnitus - Tinnitus is more noticeable at night and disturbs sleep - Symptoms have improved over the last week  Neurology care access - Seeking a new neurologist after discontinuing care with previous provider due to dissatisfaction with not seeing the neurologist directly - No neurology evaluation in over a year     ROS Per HPI      Objective:    BP (!) 142/78 (BP Location: Left Arm, Patient Position: Sitting)   Pulse 72   Temp 97.7 F (36.5 C) (Temporal)   Ht 5' 1.5 (1.562 m)   SpO2 96%   BMI 28.44 kg/m    Physical Exam Vitals and nursing note reviewed.  Constitutional:      General: She is not in acute distress. HENT:     Head: Normocephalic and atraumatic.     Right Ear: Tympanic membrane, ear canal and external ear normal.     Left Ear: Tympanic membrane, ear canal and external ear normal.  Eyes:     Extraocular Movements: Extraocular movements intact.  Cardiovascular:     Rate and  Rhythm: Normal rate and regular rhythm.     Pulses: Normal pulses.     Heart sounds: Normal heart sounds.  Pulmonary:     Effort: Pulmonary effort is normal. No respiratory distress.     Breath sounds: Normal breath sounds. No wheezing, rhonchi or rales.  Musculoskeletal:     Cervical back: Normal range of motion.     Right lower leg: No edema.     Left lower leg: No edema.     Comments: In wheelchair for visit  Lymphadenopathy:     Cervical: No cervical adenopathy.  Neurological:     Mental Status: She is alert and oriented to person, place, and time.     Comments: Generalized tremors consistent with parkinsonism  Psychiatric:        Mood and Affect: Mood normal.        Thought Content: Thought content normal.     No results found for any visits on 11/22/23.      Assessment & Plan:   Assessment and Plan Assessment & Plan Chronic pain of lower extremities Intermittent pain managed with tramadol  and acetaminophen . Limited mobility requiring walker. - Refill tramadol  prescription   Parkinson's disease Limited mobility and chronic pain. No neurologist care for over a year. Considering neurologist options. - Consider returning to previous neurologist or seek a new referral if needed.  Tinnitus aurium, right Intermittent  bell-like noise improved post ear wax removal. Symptoms more noticeable at night. - Monitor symptoms as the ear adjusts to the absence of wax.  Polypharmacy Difficulty managing multiple medications. Occasional assistance provided. - Continue to provide assistance with medication management as needed.     No orders of the defined types were placed in this encounter.    Meds ordered this encounter  Medications   traMADol  (ULTRAM ) 50 MG tablet    Sig: Take 1-2 tablets (50-100 mg total) by mouth every 8 (eight) hours as needed for severe pain (pain score 7-10).    Dispense:  90 tablet    Refill:  0    Return if symptoms worsen or fail to  improve.  Corean LITTIE Ku, [name]

## 2023-11-22 NOTE — Patient Instructions (Signed)
 I have refilled your tramadol  today. You may take 1-2 tablets every 8 hours as needed for leg pain.   Continue current medication regimen.   The ringing in your ears should continue to improve over the next couple of weeks.   Follow up with neurology as scheduled.   Follow up with PCP as scheduled

## 2024-02-01 ENCOUNTER — Other Ambulatory Visit: Payer: Self-pay | Admitting: Internal Medicine

## 2024-02-08 ENCOUNTER — Other Ambulatory Visit: Payer: Self-pay | Admitting: Internal Medicine

## 2024-02-21 ENCOUNTER — Telehealth: Payer: Self-pay

## 2024-02-21 NOTE — Telephone Encounter (Signed)
 Copied from CRM [id number]. Topic: Clinical - Medical Advice >> Feb 21, 2024  1:00 PM Delon T wrote:  Reason for CRM: Patient is concerned, she is getting confused about  her medications, not taking them like she should because she has so many of them, is asking if there is a service to have someone come out and help her- [phone]

## 2024-02-23 ENCOUNTER — Encounter: Payer: Self-pay | Admitting: Internal Medicine

## 2024-02-23 ENCOUNTER — Ambulatory Visit: Admitting: Internal Medicine

## 2024-02-23 VITALS — BP 142/78 | HR 83 | Temp 99.2°F | Ht 61.5 in

## 2024-02-23 DIAGNOSIS — Z9189 Other specified personal risk factors, not elsewhere classified: Secondary | ICD-10-CM | POA: Diagnosis not present

## 2024-02-23 DIAGNOSIS — G20A1 Parkinson's disease without dyskinesia, without mention of fluctuations: Secondary | ICD-10-CM

## 2024-02-23 DIAGNOSIS — G3184 Mild cognitive impairment, so stated: Secondary | ICD-10-CM

## 2024-02-23 DIAGNOSIS — I10 Essential (primary) hypertension: Secondary | ICD-10-CM

## 2024-02-23 DIAGNOSIS — Z23 Encounter for immunization: Secondary | ICD-10-CM

## 2024-02-23 NOTE — Patient Instructions (Addendum)
 Ok to start today with having your Son be in charge of keeping all medications taken every day at the right times  You will be contacted regarding the referral for: Our office pharmacist to go over the meds with you both  Please continue all other medications as before, and refills have been done if requested.  Please have the pharmacy call with any other refills you may need.  Please keep your appointments with your specialists as you may have planned  - neurology

## 2024-02-23 NOTE — Telephone Encounter (Signed)
 Mallory Klein was referred to [facility] agency back in April.  Should I make another referral for a nurse visit?  Thanks

## 2024-02-23 NOTE — Assessment & Plan Note (Signed)
 BP Readings from Last 3 Encounters:  02/23/24 (!) 142/78  11/22/23 (!) 142/78  11/10/23 (!) 146/78   Uncontrolled but not taking meds recently though her meds come in a convenient pill pack, pt to restart medical treatment hct 12.5 every day and diovan  40 every day, son agrees with oversight responsibility

## 2024-02-23 NOTE — Progress Notes (Signed)
 Patient ID: Mallory Klein, female   DOB: 05-28-1945, 78 y.o.   MRN: [id number]        Chief Complaint: follow up medication compliance ability, hx of PD and recent fall x 1 without injury, worsening subjective memory, htn       HPI:  Mallory Klein is a 78 y.o. female here with c/o simply that she is not reliable with administration of her medications anymore, has been getting worse recently, admits to missing some or all her meds most days.  PD tremors worsening, had fall x 1 last wk without injury, has been referred to neurology sept 2025 but not seen recently.  Was seeing Dr Tat 2019 but pt changed herself to Dr Buck.  Has been treated with sinamet in past, then changed to mirapex  2022, but no meds recently.  Has  Has been referred to [facility] in April 2025 but not clear if seen.  Of note, pt admits to worsening ST memory and cognitive issues.  She lives with her son Mallory Klein in the home (he is about 78yo and has hx of TBI and cognitive issue as well) but has been able to handle his own medication regimen.  It appears son is surprised about the med admin difficulty with his mother, as she has never mentioned this.  Due for flu shot today  Pt denies chest pain, increased sob or doe, wheezing, orthopnea, PND, increased LE swelling, palpitations, dizziness or syncope.   Pt denies polydipsia, polyuria Wt Readings from Last 3 Encounters:  11/02/23 153 lb (69.4 kg)  10/27/23 151 lb (68.5 kg)  10/20/23 151 lb (68.5 kg)   BP Readings from Last 3 Encounters:  02/23/24 (!) 142/78  11/22/23 (!) 142/78  11/10/23 (!) 146/78         Past Medical History:  Diagnosis Date   Abnormal CBC 02/05/2015   Dr Lanny 12/16 new: normocytic anemia and thrombocytosis   Adjustment disorder with mixed anxiety and depressed mood 02/15/2007   Chronic  Chronic pain Grief, stress Effexor  XR  Dad died in 2025-02-26   Asthma    Diabetes mellitus without complication (HCC)    Diverticulosis of colon  (without mention of hemorrhage)    Esophageal stricture    Essential hypertension 01/26/2007   Chronic Verapamil     Falls    I blackout   GERD (gastroesophageal reflux disease) 02/15/2007   Chronic     Heart murmur    Hyperglycemia 03/02/2011   Mild     Insomnia disorder 12/08/2018   10/20 Carbid/Lev dose was increased - c/o hard time falling asleep (6 am) getting up at 12 am, poor sleep. Try Temazepam  15-30 mg at 11-1:30 pm    Internal hemorrhoids without mention of complication    Iron deficiency anemia 04/23/2017   Mild neurocognitive disorder, likely due to Parkinson's disease 02/09/2019   OSA (obstructive sleep apnea) 02/14/2010   In lab study (May 2018): AHI 25. Only had 45 minutes of sleep secondary to frequent awakenings secondary to sleep apnea. autocpap 5-15 cm water.    Osteoarthritis    Parkinson disease (HCC) Feb 26, 2014   2015 2017  Primidone  - d/c 2019 Parkinson's: Sinmet IR. Dr Tat   Postherpetic trigeminal neuralgia 01/26/2007   Qualifier: Diagnosis of  By: Wilhemina RMA, [name]     Shortness of breath dyspnea    with allergies   Shoulder contusion 07/31/2020   severe R shoulder pain after a fall on 07/20/20, neck pain. There was a  LOC. Pt went to ER 4 timeas. All CTs,X rays were (-)     Sleep apnea    Syncope and collapse 08/06/2020   Remote syncope on 07/20/2020.  Possible recurrent.  Cardiology consultation     Thrombocytosis 05/24/2017   Urinary incontinence 01/26/2007   Chronic  10/17 [place]  LA    Vitamin D deficiency 01/26/2019   Past Surgical History:  Procedure Laterality Date   ABDOMINAL HYSTERECTOMY     BIOPSY  10/19/2022   Procedure: BIOPSY;  Surgeon: Abran Norleen SAILOR, MD;  Location: [facility] ENDOSCOPY;  Service: Gastroenterology;;   BREAST CYST EXCISION Left 10/25/1978   COLONOSCOPY     Multiple   COLONOSCOPY WITH PROPOFOL  N/A 04/15/2023   Procedure: COLONOSCOPY WITH PROPOFOL ;  Surgeon: Federico Rosario BROCKS, MD;  Location: [facility] ENDOSCOPY;  Service: Gastroenterology;   Laterality: N/A;   ESOPHAGOGASTRODUODENOSCOPY (EGD) WITH PROPOFOL  N/A 10/19/2022   Procedure: ESOPHAGOGASTRODUODENOSCOPY (EGD) WITH PROPOFOL ;  Surgeon: Abran Norleen SAILOR, MD;  Location: [facility] ENDOSCOPY;  Service: Gastroenterology;  Laterality: N/A;   ESOPHAGOGASTRODUODENOSCOPY (EGD) WITH PROPOFOL  N/A 04/15/2023   Procedure: ESOPHAGOGASTRODUODENOSCOPY (EGD) WITH PROPOFOL ;  Surgeon: Federico Rosario BROCKS, MD;  Location: [facility] ENDOSCOPY;  Service: Gastroenterology;  Laterality: N/A;   HOT HEMOSTASIS N/A 04/15/2023   Procedure: HOT HEMOSTASIS (ARGON PLASMA COAGULATION/BICAP);  Surgeon: Federico Rosario BROCKS, MD;  Location: [facility] ENDOSCOPY;  Service: Gastroenterology;  Laterality: N/A;   LUMBAR LAMINECTOMY/DECOMPRESSION MICRODISCECTOMY Bilateral 02/20/2014   Procedure: LUMBAR TWO THREE, LUMBAR THREE FOUR LUMBAR LAMINECTOMY/DECOMPRESSION MICRODISCECTOMY 2 LEVELS;  Surgeon: Alm GORMAN Molt, MD;  Location: [facility] NEURO ORS;  Service: Neurosurgery;  Laterality: Bilateral;   SPLENECTOMY     TIBIA FRACTURE SURGERY Right    TIBIA FRACTURE SURGERY Left    TOTAL KNEE ARTHROPLASTY Right 02/24/2011    reports that she has never smoked. She has never used smokeless tobacco. She reports current alcohol use. She reports that she does not use drugs. family history includes Asthma in her mother; Dementia in her mother; Diabetes in her mother; Diabetes type II in her mother; Hypertension in her mother; Mental illness in her father; Prostate cancer in her father; Stroke in her brother; Suicidality in her son. Allergies[1] Medications Ordered Prior to Encounter[2]      ROS:  All others reviewed and negative.  Objective        PE:  BP (!) 142/78 (BP Location: Right Arm, Patient Position: Sitting, Cuff Size: Normal)   Pulse 83   Temp 99.2 F (37.3 C) (Oral)   Ht 5' 1.5 (1.562 m)   SpO2 93%   BMI 28.44 kg/m                 Constitutional: Pt appears in NAD               HENT: Head: NCAT.                Right Ear: External ear normal.                  Left Ear: External ear normal.                Eyes: . Pupils are equal, round, and reactive to light. Conjunctivae and EOM are normal               Nose: without d/c or deformity               Neck: Neck supple. Gross normal ROM  Cardiovascular: Normal rate and regular rhythm.                 Pulmonary/Chest: Effort normal and breath sounds without rales or wheezing.                Abd:  Soft, NT, ND, + BS, no organomegaly               Neurological: Pt is alert. At baseline orientation, motor grossly intact but has moderate right > left PD movements involuntary               Skin: Skin is warm. No rashes, no other new lesions, LE edema - none               Psychiatric: Pt behavior is normal without agitation   Micro: none  Cardiac tracings I have personally interpreted today:  none  Pertinent Radiological findings (summarize): none   Lab Results  Component Value Date   WBC 7.7 10/27/2023   HGB 9.9 (L) 10/27/2023   HCT 31.1 (L) 10/27/2023   PLT 339.0 10/27/2023   GLUCOSE 75 10/27/2023   GLUCOSE 75 10/27/2023   CHOL 200 (H) 01/07/2021   TRIG 53 01/07/2021   HDL 79 01/07/2021   LDLDIRECT 109.8 02/10/2010   LDLCALC 111 (H) 01/07/2021   ALT 7 10/27/2023   AST 17 10/27/2023   NA 140 10/27/2023   NA 140 10/27/2023   K 4.6 10/27/2023   K 4.6 10/27/2023   CL 101 10/27/2023   CL 101 10/27/2023   CREATININE 1.28 (H) 10/27/2023   CREATININE 1.28 (H) 10/27/2023   BUN 20 10/27/2023   BUN 20 10/27/2023   CO2 33 (H) 10/27/2023   CO2 33 (H) 10/27/2023   TSH 0.99 10/27/2023   INR 1.2 04/12/2023   HGBA1C 6.1 06/25/2022   Assessment/Plan:  KILANI JOFFE is a 78 y.o. Black or African American [2] female with  has a past medical history of Abnormal CBC (02/05/2015), Adjustment disorder with mixed anxiety and depressed mood (02/15/2007), Asthma, Diabetes mellitus without complication (HCC), Diverticulosis of colon (without mention of hemorrhage), Esophageal  stricture, Essential hypertension (01/26/2007), Falls, GERD (gastroesophageal reflux disease) (02/15/2007), Heart murmur, Hyperglycemia (03/02/2011), Insomnia disorder (12/08/2018), Internal hemorrhoids without mention of complication, Iron deficiency anemia (04/23/2017), Mild neurocognitive disorder, likely due to Parkinson's disease (02/09/2019), OSA (obstructive sleep apnea) (02/14/2010), Osteoarthritis, Parkinson disease (HCC) (02/09/2014), Postherpetic trigeminal neuralgia (01/26/2007), Shortness of breath dyspnea, Shoulder contusion (07/31/2020), Sleep apnea, Syncope and collapse (08/06/2020), Thrombocytosis (05/24/2017), Urinary incontinence (01/26/2007), and Vitamin D deficiency (01/26/2019).  Parkinson disease (HCC) Pt to continue referral to neurology, declines further med for now  Essential hypertension BP Readings from Last 3 Encounters:  02/23/24 (!) 142/78  11/22/23 (!) 142/78  11/10/23 (!) 146/78   Uncontrolled but not taking meds recently though her meds come in a convenient pill pack, pt to restart medical treatment hct 12.5 every day and diovan  40 every day, son agrees with oversight responsibility   Mild neurocognitive disorder, likely due to Parkinson's disease With ? Worsening subjectively, pt for neurology f/u as well  At risk for medication noncompliance Pt has nicely packaged pill pack for her meds but just taking few intermittently, son agrees to take over med [employer] responsibility, and she agrees as well, also refer to office Pharmacist to assist with med compliance and risk of polypharmacy  Followup: Return if symptoms worsen or fail to improve.  Mallory Rush, MD 02/23/2024 3:36 PM [facility]   Primary Care - [facility]     [1]  Allergies Allergen Reactions   Ace Inhibitors     Patient doesn't recall reaction.  jkl   Benicar  [Olmesartan ]     It made her sick   Aspirin Other (See Comments)    bruising    Citalopram Hydrobromide Diarrhea  [2]  Current Outpatient Medications on File Prior to Visit  Medication Sig Dispense Refill   cetirizine  (ZYRTEC ) 10 MG tablet TAKE ONE TABLET BY MOUTH EVERY DAY 90 tablet 3   furosemide  (LASIX ) 40 MG tablet Take 1 tablet (40 mg total) by mouth daily as needed. (Patient not taking: Reported on 11/22/2023) 30 tablet 5   hydrochlorothiazide  (MICROZIDE ) 12.5 MG capsule Take 1 capsule (12.5 mg total) by mouth daily. 90 capsule 1   hydrOXYzine  (VISTARIL ) 50 MG capsule Take 1 capsule (50 mg total) by mouth every 6 (six) hours as needed for anxiety. (Patient not taking: Reported on 11/22/2023)     methocarbamol  (ROBAXIN ) 500 MG tablet Take 1 tablet (500 mg total) by mouth every 8 (eight) hours as needed for muscle spasms. (Patient not taking: Reported on 11/22/2023) 60 tablet 2   Multiple Vitamin (MULTIVITAMIN WITH MINERALS) TABS tablet Take 1 tablet by mouth daily. (Patient not taking: Reported on 11/22/2023)     pantoprazole  (PROTONIX ) 40 MG tablet Take 1 tablet (40 mg total) by mouth 2 (two) times daily. 180 tablet 3   polyethylene glycol (MIRALAX  / GLYCOLAX ) 17 g packet Take 17 g by mouth daily as needed for mild constipation. (Patient not taking: Reported on 11/22/2023)     potassium chloride  SA (KLOR-CON  M) 20 MEQ tablet Take 1 tablet (20 mEq total) by mouth daily for 3 days. 90 tablet 3   Pramipexole  Dihydrochloride 0.75 MG TB24 Take 1 tablet (0.75 mg total) by mouth in the morning and at bedtime. 180 tablet 1   QUEtiapine  (SEROQUEL ) 100 MG tablet TAKE ONE TABLET BY MOUTH AT BEDTIME 90 tablet 1   rosuvastatin  (CRESTOR ) 10 MG tablet TAKE ONE TABLET BY MOUTH EVERY DAY 90 tablet 1   solifenacin  (VESICARE ) 5 MG tablet TAKE ONE TABLET BY MOUTH EVERY DAY 90 tablet 1   traMADol  (ULTRAM ) 50 MG tablet Take 1-2 tablets (50-100 mg total) by mouth every 8 (eight) hours as needed for severe pain (pain score 7-10). 90 tablet 0   traZODone  (DESYREL ) 50 MG tablet Take 0.5-1 tablets  (25-50 mg total) by mouth at bedtime as needed for sleep. 60 tablet 5   valsartan  (DIOVAN ) 40 MG tablet Take 1 tablet (40 mg total) by mouth daily. 90 tablet 3   [DISCONTINUED] donepezil  (ARICEPT ) 5 MG tablet Take 1 tablet (5 mg total) by mouth at bedtime. 90 tablet 3   [DISCONTINUED] venlafaxine  XR (EFFEXOR -XR) 150 MG 24 hr capsule TAKE 1 CAPSULE BY MOUTH  EVERY DAY WITH BREAKFAST 90 capsule 3   No current facility-administered medications on file prior to visit.

## 2024-02-23 NOTE — Telephone Encounter (Signed)
 Pt is being seen today for her medication concerns.

## 2024-02-23 NOTE — Assessment & Plan Note (Addendum)
 Pt to continue referral to neurology, declines further med for now

## 2024-02-23 NOTE — Assessment & Plan Note (Signed)
 Pt has nicely packaged pill pack for her meds but just taking few intermittently, son agrees to take over med [employer] responsibility, and she agrees as well, also refer to office Pharmacist to assist with med compliance and risk of polypharmacy

## 2024-02-23 NOTE — Assessment & Plan Note (Signed)
 With ? Worsening subjectively, pt for neurology f/u as well

## 2024-02-28 ENCOUNTER — Telehealth: Payer: Self-pay | Admitting: *Deleted

## 2024-02-28 ENCOUNTER — Ambulatory Visit: Payer: Self-pay

## 2024-02-28 NOTE — Telephone Encounter (Signed)
 FYI Only or Action Required?: Action required by provider: Patient would like new home health nurse referral for medications. .  Patient was last seen in primary care on 02/23/2024 by Norleen Lynwood ORN, MD.  Called Nurse Triage reporting Medication Problem.  Symptoms began ongong.  Interventions attempted: Other: spoken with office.  Symptoms are: stable.  Triage Disposition: Information or Advice Only Call  Patient/caregiver understands and will follow disposition?:     Copied from CRM [id number]. Topic: Clinical - Red Word Triage >> Feb 28, 2024  3:57 PM Eva FALCON wrote: Red Word that prompted transfer to Nurse Triage: is having some confusion, not sure what medications to take, has been using a walker is having difficulty walking, unable to get around her house, is unable to sleep feeling restless. seems very distraught. Reason for Disposition  Caller has medicine question only, adult not sick, AND triager answers question  Answer Assessment - Initial Assessment Questions Pt concerned that she is not taking medication correctly. Has reached to the office and has phone appt with pharmacist on Wednesday. Adivsed to have husband with her of appt. Have all med available and her multi dosage pill containers.  Discussed Dr. Garald suggestion to have home nurse come out. She agreed to this.   1. NAME of MEDICINE: What medicine(s) are you calling about?     All of them 2. QUESTION: What is your question? (e.g., double dose of medicine, side effect)     Is concerned she is not taking meds correctly 3. PRESCRIBER: Who prescribed the medicine? Reason: if prescribed by specialist, call should be referred to that group.      4. SYMPTOMS: Do you have any symptoms? If Yes, ask: What symptoms are you having?  How bad are the symptoms (e.g., mild, moderate, severe)     Shaking more from the parkinson's  Protocols used: Medication Question Call-A-AH

## 2024-02-28 NOTE — Progress Notes (Signed)
 Care Guide Pharmacy Note  02/28/2024 Name: Mallory Klein MRN: [id number] DOB: 07-22-1945  Referred By: Garald Karlynn GAILS, MD Reason for referral: Call Attempt #1 and Complex Care Management (Outreach to schedule referral with pharmacist )   Mallory Klein is a 79 y.o. year old female who is a primary care patient of Plotnikov, Karlynn GAILS, MD.  Mallory Klein was referred to the pharmacist for assistance related to: HTN  Successful contact was made with the patient to discuss pharmacy services including being ready for the pharmacist to call at least 5 minutes before the scheduled appointment time and to have medication bottles and any blood pressure readings ready for review. The patient agreed to meet with the pharmacist via telephone visit on 03/01/2024  [name], CMA, AAMA [place]  [facility], [facility] Guide, Lead Direct Dial: [phone]  Fax: [phone]

## 2024-03-01 ENCOUNTER — Other Ambulatory Visit

## 2024-03-01 NOTE — Telephone Encounter (Signed)
 I agree with  --- has reached to the office and has phone appt with pharmacist on Wednesday. Adivsed to have husband with her of appt. Have all med available and her multi dosage pill containers.   Thanks

## 2024-03-02 ENCOUNTER — Ambulatory Visit: Admitting: Pharmacist

## 2024-03-02 DIAGNOSIS — Z9189 Other specified personal risk factors, not elsewhere classified: Secondary | ICD-10-CM | POA: Diagnosis not present

## 2024-03-02 NOTE — Progress Notes (Cosign Needed)
 "  03/03/2024 Name: Mallory Klein MRN: [id number] DOB: 23-Sep-1945  Chief Complaint  Patient presents with   Medication Management    Mallory Klein is a 79 y.o. year old female who was referred for medication management by their primary care provider, Plotnikov, Mallory GAILS, Klein. They presented for a face to face visit today. Patient presented ambulating by wheelchair and her husband accompanied her.   They were referred to the pharmacist by their PCP for assistance in managing complex medication management    Subjective:  Care Team: Primary Care Provider: Garald Mallory GAILS, Klein ; Next Scheduled Visit: not scheduled  Medication Access/Adherence  Current Pharmacy:  [facility] - [place], [place] - [address] [place] [address] Phone: [phone] Fax: [phone]  [facility] [address] [place] [address] Phone: [phone] Fax: [phone]   Patient reports affordability concerns with their medications: No  Patient reports access/transportation concerns to their pharmacy: Yes  Patient reports adherence concerns with their medications:  Yes     Medication Management:  Current adherence strategy: Patient has been receiving pill packs from her pharmacy. Pharmacy reports patient has not been picking up her medications on time and mentioned barriers related to transportation. Pharmacy has stopped refilling medications in pill packs due to patient noncompliance, but are agreeable to start again with improved adherence.  Patient reports Poor adherence to medications. She states she gets overwhelmed and confused due to the number of medications she has.  Pt brought all of her medications with her to the appointment. She had a few pill Klein cards from 12/2022 that were mostly unused. She also had pill bottles filled in September and December 2025, which she reports she has not been  taking.  Her husband agrees to help her take her medications, however he is also confused by the medications.  Patient reports the following barriers to adherence: cognitive decline, transportation   Recent fill dates:  Per pharmacy, last fill was in September of 2025.  Objective:  Lab Results  Component Value Date   HGBA1C 6.1 06/25/2022    Lab Results  Component Value Date   CREATININE 1.28 (H) 10/27/2023   CREATININE 1.28 (H) 10/27/2023   BUN 20 10/27/2023   BUN 20 10/27/2023   NA 140 10/27/2023   NA 140 10/27/2023   K 4.6 10/27/2023   K 4.6 10/27/2023   CL 101 10/27/2023   CL 101 10/27/2023   CO2 33 (H) 10/27/2023   CO2 33 (H) 10/27/2023    Lab Results  Component Value Date   CHOL 200 (H) 01/07/2021   HDL 79 01/07/2021   LDLCALC 111 (H) 01/07/2021   LDLDIRECT 109.8 02/10/2010   TRIG 53 01/07/2021   CHOLHDL 2.5 01/07/2021    Medications Reviewed Today     Reviewed by Mallory Klein, RPH-CPP (Pharmacist) on 03/03/24 at 1425  Med List Status: <None>   Medication Order Taking? Sig Documenting Provider Last Dose Status Informant  cetirizine  (ZYRTEC ) 10 MG tablet [id number]  TAKE ONE TABLET BY MOUTH EVERY DAY  Patient not taking: Reported on 03/03/2024   Plotnikov, Mallory GAILS, Klein  Active Self           Med Note Mallory Klein Mallory Klein 17, 2025  6:21 PM)      Discontinued 07/23/20 1924          Med Note Mallory Klein   Tue Jun 25, 2020  2:21 PM)    furosemide  (LASIX ) 40 MG tablet [id number]  Take 1 tablet (40 mg total) by mouth daily as needed.  Patient not taking: Reported on 11/22/2023   Plotnikov, Mallory Klein  Active   hydrochlorothiazide  (MICROZIDE ) 12.5 MG capsule [id number]  Take 1 capsule (12.5 mg total) by mouth daily.  Patient not taking: Reported on 03/03/2024   Mallory Klein B, [name]  Active   hydrOXYzine  (VISTARIL ) 50 MG capsule [id number]  Take 1 capsule (50 mg total) by mouth every 6 (six) hours as needed for anxiety.  Patient not taking:  Reported on 11/22/2023   Mallory Klein LABOR, Klein  Active Self  methocarbamol  (ROBAXIN ) 500 MG tablet [id number]  Take 1 tablet (500 mg total) by mouth every 8 (eight) hours as needed for muscle spasms.  Patient not taking: Reported on 11/22/2023   Plotnikov, Mallory Klein  Active   Multiple Vitamin (MULTIVITAMIN WITH MINERALS) TABS tablet [id number]  Take 1 tablet by mouth daily.  Patient not taking: Reported on 11/22/2023   Mallory Klein LABOR, Klein  Active Self  pantoprazole  (PROTONIX ) 40 MG tablet [id number]  Take 1 tablet (40 mg total) by mouth 2 (two) times daily.  Patient not taking: Reported on 03/03/2024   Plotnikov, Mallory Klein  Active   polyethylene glycol (MIRALAX  / GLYCOLAX ) 17 g packet [id number]  Take 17 g by mouth daily as needed for mild constipation.  Patient not taking: Reported on 11/22/2023   Provider, Historical, Klein  Active Self  potassium chloride  SA (KLOR-CON  M) 20 MEQ tablet [id number]  Take 1 tablet (20 mEq total) by mouth daily for 3 days.  Patient not taking: Reported on 03/03/2024   Plotnikov, Mallory GAILS, Klein  Expired 11/22/23 2359   Pramipexole  Dihydrochloride 0.75 MG TB24 [id number]  Take 1 tablet (0.75 mg total) by mouth in the morning and at bedtime.  Patient not taking: Reported on 03/03/2024   Mallory Klein, [name], NP  Active Self           Med Note Mallory Klein Mallory Klein 17, 2025  6:19 PM)    QUEtiapine  (SEROQUEL ) 100 MG tablet [id number]  TAKE ONE TABLET BY MOUTH AT BEDTIME  Patient not taking: Reported on 03/03/2024   Mallory Klein B, [name]  Active   rosuvastatin  (CRESTOR ) 10 MG tablet [id number]  TAKE ONE TABLET BY MOUTH EVERY DAY  Patient not taking: Reported on 03/03/2024   Plotnikov, Mallory Klein  Active   solifenacin  (VESICARE ) 5 MG tablet [id number]  Take 1 tablet (5 mg total) by mouth daily.  Patient not taking: Reported on 03/03/2024   Plotnikov, Mallory Klein  Active   traMADol  (ULTRAM ) 50 MG tablet [id number] Yes Take 1-2 tablets (50-100 mg total) by mouth every 8 (eight)  hours as needed for severe pain (pain score 7-10). Mallory Klein LITTIE, [name]  Active   traZODone  (DESYREL ) 50 MG tablet [id number]  Take 0.5-1 tablets (25-50 mg total) by mouth at bedtime as needed for sleep.  Patient not taking: Reported on 03/03/2024   Plotnikov, Mallory Klein  Active   valsartan  (DIOVAN ) 40 MG tablet [id number]  Take 1 tablet (40 mg total) by mouth daily.  Patient not taking: Reported on 03/03/2024   Geofm Glade PARAS, Klein  Active     Discontinued 07/23/20 1924               Assessment/Plan:   Medication Management: - Currently strategy insufficient to maintain appropriate  adherence to prescribed medication regimen - Suggested use of weekly pill box to organize medications while waiting for pill packs to start back up however not confident they will be able to safely manage refilling the pill box. - Created list of daily and as needed medications, indication, and administration time. Provided to patient. - Discarded expired medications - Discussed collaboration with local pharmacies for adherence packaging.  -Reviewed local pharmacies with adherence packaging options. Patient elects to stay with her current pharmacy, [facility] pharmacy. Will contact pharmacy to coordinate filling of pill packs. -Recommend to follow up with PCP in 2-3 months to assess after improved adherence to medications. Scheduled patient with PCP in March. She will need BP check and BMP after restarting her medications. -Coordinated pill Klein processing with [facility]. Faxed an updated medication list to pharmacy and spoke with patient to inform them to bring their medications to [facility] pharmacy as soon as possible to be repackaged.  Follow Up Plan: Telephone visit in one month to assess adherence  Dionicia Canavan, PharmD, RPh PGY1 Acute Care Pharmacy Resident [facility]  Darrelyn Drum, PharmD, BCPS, CPP Clinical Pharmacist Practitioner [place] [facility] [phone]   Medical screening examination/treatment/procedure(s) were performed by non-physician practitioner and as supervising physician I was immediately available for consultation/collaboration.  I agree with above. Mallory Noel, Klein  "

## 2024-03-02 NOTE — Patient Instructions (Addendum)
 It was a pleasure speaking with you today!  I will reach out to [facility] pharmacy to repackage your medications into pill packs. I will call you with an expected arrival of these medications and pill packs.   Feel free to call with any questions or concerns!   Darrelyn Drum, PharmD, BCPS, CPP Clinical Pharmacist Practitioner [facility] [phone]

## 2024-03-03 MED ORDER — SOLIFENACIN SUCCINATE 5 MG PO TABS: 5.0000 mg | ORAL_TABLET | Freq: Every day | ORAL | 1 refills | Status: AC

## 2024-03-06 ENCOUNTER — Telehealth: Payer: Self-pay

## 2024-03-06 NOTE — Telephone Encounter (Signed)
 Copied from CRM [id number]. Topic: Clinical - Prescription Issue >> Mar 03, 2024  4:46 PM Tinnie BROCKS wrote: Reason for CRM: [facility] is calling to request a new prescription to be sent for pantoprazole  with clarified instructions of once a day or twice a day.  [facility] - [place], [place] - [address] [place] [address] Phone: [phone] Fax: [phone]

## 2024-03-10 ENCOUNTER — Telehealth: Payer: Self-pay

## 2024-03-10 DIAGNOSIS — Z9189 Other specified personal risk factors, not elsewhere classified: Secondary | ICD-10-CM

## 2024-03-10 MED ORDER — PANTOPRAZOLE SODIUM 40 MG PO TBEC: 40.0000 mg | DELAYED_RELEASE_TABLET | Freq: Every day | ORAL | 1 refills | Status: DC

## 2024-03-10 NOTE — Progress Notes (Addendum)
" ° °  03/10/2024 Name: NIHARIKA SAVINO MRN: [id number] DOB: 1945/12/03  Chief Complaint  Patient presents with   Medication Management    DONNAMARIE SHANKLES is a 79 y.o. year old female who was referred for medication management by their primary care provider, Plotnikov, Karlynn GAILS, MD. Telephone encounter was conducted to follow-up on medication adherence.  They were referred to the pharmacist by their PCP for assistance in managing complex medication management    Subjective/Objective: Ms. Barcus reports starting her medications on Friday, January 9. She has been taking her medications consistently, both in the morning and at night as packaged by her pharmacy. She reports feeling like her medications are affecting her negatively, and therefore stopped taking them yesterday evening on 1/15 with plan to call the office to report her symptoms. She reports having difficulty sleeping which was not an issue prior to starting her medications. States she is having to get up several times throughout the night to use the bathroom and requires assistance from her husband. She reports waking up feeling more drowsy than before. She has tried taking trazodone  as needed for sleep, but this does not help. Prior to starting this regimen, patients states she has had no issues falling and staying asleep.    Medication Access/Adherence Current Pharmacy:  [facility] - [place], [place] - [address] [place] [address] Phone: [phone] Fax: [phone]  [name] [address] [place] [address] Phone: [phone] Fax: [phone]  Patient reports affordability concerns with their medications: No  Patient reports access/transportation concerns to their pharmacy: Yes  Patient reports adherence concerns with their medications:  Yes    Medication Management: Current adherence strategy: Patient received updated pill packs  from [facility]. Patient has been consistently taking morning and evening medications as directed.       Assessment/Plan:   Medication Management: - Currently strategy insufficient to maintain appropriate adherence to prescribed medication regimen due to medication side effects - Recommend to discontinue hydrochlorothiazide , quetiapine , and solifenacin  for now given adverse effects (disrupted sleep, increased urination overnight). - Recommend to move other chronic medications in the morning to simplify regimen. - Discussed repackaging of medications with [facility], pharmacy agreeable to repackage.  - Patient's family member will bring her pill packages to pharmacy to be repackaged. - Pharmacy requested new prescription for pantoprazole  to match frequency of once daily (previously sent as twice daily). -Faxed updated medication list to Pharmacy.     Follow Up Plan: Telephone visit in one month to assess adherence  Dionicia Canavan, PharmD, RPh PGY1 Acute Care Pharmacy Resident [facility]  Darrelyn Drum, PharmD, BCPS, CPP Clinical Pharmacist Practitioner [place] [facility] [phone]   Medical screening examination/treatment/procedure(s) were performed by non-physician practitioner and as supervising physician I was immediately available for consultation/collaboration.  I agree with above. Karlynn Noel, MD  "

## 2024-03-13 ENCOUNTER — Other Ambulatory Visit: Payer: Self-pay | Admitting: Internal Medicine

## 2024-03-13 MED ORDER — PANTOPRAZOLE SODIUM 40 MG PO TBEC: 40.0000 mg | DELAYED_RELEASE_TABLET | Freq: Every day | ORAL | 2 refills | Status: AC

## 2024-03-13 NOTE — Telephone Encounter (Signed)
 Please inform pt of the following upon her call back to the clinic as follows per her PCP Okay. Once a day pantoprazole . Thanks

## 2024-03-13 NOTE — Telephone Encounter (Signed)
 Okay.  Once a day pantoprazole .  Thanks

## 2024-03-22 ENCOUNTER — Ambulatory Visit: Payer: Self-pay

## 2024-03-22 NOTE — Telephone Encounter (Signed)
 FYI Only or Action Required?: FYI only for provider: appointment scheduled on 03/23/24.  Patient was last seen in primary care on 02/23/2024 by Norleen Lynwood ORN, MD.  Called Nurse Triage reporting Fatigue and Anorexia.  Symptoms began several months ago.  Interventions attempted: Nothing.  Symptoms are: gradually worsening.  Triage Disposition: See Physician Within 24 Hours  Patient/caregiver understands and will follow disposition?: Yes                      Message from Aisha D sent at 03/22/2024  3:45 PM EST  Reason for Triage: Pt stated that she is experiencing weakness, no energy, and has no appetite. Pt stated that she doesn't feel the same and is concerned and would like to speak with someone regarding this concern.   Reason for Disposition  [1] MODERATE weakness (e.g., interferes with work, school, normal activities) AND [2] persists > 3 days  Answer Assessment - Initial Assessment Questions 1. DESCRIPTION: Describe how you are feeling.     Can't do anything for myself.   2. SEVERITY: How bad is it?  Can you stand and walk?     Has Parkinson's, at baseline requires some assistance. Uses walker to ambulate and has wheelchair available.  3. ONSET: When did these symptoms begin? (e.g., hours, days, weeks, months)     Couple months.  4. CAUSE: What do you think is causing the weakness or fatigue? (e.g., not drinking enough fluids, medical problem, trouble sleeping)     Unsure, thinks her medications could be causing weakness.  5. NEW MEDICINES:  Have you started on any new medicines recently? (e.g., opioid pain medicines, benzodiazepines, muscle relaxants, antidepressants, antihistamines, neuroleptics, beta blockers)     She wasn't compliant with her medications and recently had them organized and has been regularly taking them. Denies any recent changes to Parkinson's medications.  6. OTHER SYMPTOMS: Do you have any other symptoms? (e.g.,  chest pain, fever, cough, SOB, vomiting, diarrhea, bleeding, other areas of pain)     Constipation (1-2 days ago small BM, thinks it may be caused by her medications). No difficulty breathing, fainting/LOC, chest pain, bleeding, black tarry BM, palpitations,fever.  Protocols used: Weakness (Generalized) and Fatigue-A-AH

## 2024-03-23 ENCOUNTER — Ambulatory Visit: Admitting: Emergency Medicine

## 2024-03-23 ENCOUNTER — Ambulatory Visit: Payer: Self-pay

## 2024-03-23 ENCOUNTER — Encounter: Payer: Self-pay | Admitting: Internal Medicine

## 2024-03-23 NOTE — Telephone Encounter (Signed)
 Patient reports not able to be seen today or tomorrow,due to transportation. Unable to do video visit.  Last BM Monday, denies abd pain, distention,  hard to touch,n/v.  Requesting recommendations for constipation.  Advised call back/UC  and ED/911 if symptoms occur/worsen: severe abd pain >1 hr, vomit green bile, severe diff breathing, chest pain > 5 min, faint. Patient verbalized understanding.

## 2024-03-23 NOTE — Telephone Encounter (Signed)
 FYI Only or Action Required?: Action required by provider: clinical question for provider and update on patient condition.  Patient was last seen in primary care on 02/23/2024 by Norleen Lynwood ORN, MD.  Called Nurse Triage reporting Constipation.  Symptoms began a week ago.  Interventions attempted: Nothing.  Symptoms are: gradually worsening.  Triage Disposition: See Physician Within 24 Hours  Patient/caregiver understands and will follow disposition?: Yes  Message from [facility] F sent at 03/23/2024 10:33 AM EST  Summary: NT Appt for today being canceled, pt still having weakness   Reason for Triage: Patient was scheduled by NT for an appt today at [facility] for weakness worsening, and has to cancel this appt due to transportation issues. She is still having the weakness and would like some clinical advice on what to do in the meantime before her next appt (I did not reschedule or cancel due to symptoms)      Reason for Disposition  Last bowel movement (BM) > 4 days ago  Answer Assessment - Initial Assessment Questions 1. STOOL PATTERN OR FREQUENCY: How often do you have a bowel movement (BM)?  (Normal range: 3 times a day to every 3 days)  When was your last BM?        Pt states she usually goes everyday  2. STRAINING: Do you have to strain to have a BM?       Denies  3. ONSET: When did the constipation begin?     Earlier this week  4. RECTAL PAIN: Does your rectum hurt when the stool comes out? If Yes, ask: Do you have hemorrhoids? How bad is the pain?  (Scale 1-10; or mild, moderate, severe)      Denies  5. BM COMPOSITION: Are the stools hard?       Pt states she is unsure  6. BLOOD ON STOOLS: Has there been any blood on the toilet tissue or on the surface of the BM? If Yes, ask: When was the last time?      Denies  7. CHRONIC CONSTIPATION: Is this a new problem for you?  If No, ask: How long have you had this problem? (days, weeks, months)       Pt  denies  8. CHANGES IN DIET OR HYDRATION: Have there been any recent changes in your diet? How much fluids are you drinking on a daily basis?  How much have you had to drink today?      Yes, drinking fluids  9. MEDICINES: Have you been taking any new medicines? Are you taking any narcotic pain medicines? (e.g., Dilaudid , morphine , Percocet, Vicodin)      Pt started Protonix , however, pt is taking Tramadol  and Trazodone  everyday.  10. LAXATIVES: Have you been using any stool softeners, laxatives, or enemas?  If Yes, ask What are you using, how often, and when was the last time?       Denies  11. ACTIVITY:  How much walking do you do every day?  Has your activity level decreased in the past week?        Activities has decreased  12. CAUSE: What do you think is causing the constipation?        Pt states she is unsure  13. MEDICAL HISTORY: Do you have a history of hemorrhoids, rectal fissures, rectal surgery, or rectal abscess?         Denies  14. OTHER SYMPTOMS: Pt denies abdomen pain, bloating, fever, vomiting  Pt reports constipation/ weakness Pt offered a virtual visit, however, states if she needs it, she will call back to schedule.  Pt requesting to cancel today's visit, 01.29.26, due to lack of transportation Pt advised to watch for nausea/vomiting. Pt advised to increase fluid intake and to try taking a laxative Pt agrees with plan of care, will call back for any worsening symptoms  Protocols used: Constipation-A-AH

## 2024-03-23 NOTE — Telephone Encounter (Signed)
 Per PAS, pt called back, please advise:  Mallory Klein   03/23/2024  3:16 PM  Pt haven't had a bowel in a couple days and want to know what was recommended. Pt forgot.

## 2024-03-23 NOTE — Telephone Encounter (Signed)
 Copied from CRM #[id number]. Topic: Clinical - Red Word Triage >> Mar 23, 2024 10:29 AM Berneda FALCON wrote: Red Word that prompted transfer to Nurse Triage: Patient was scheduled by NT for an appt today at [facility] for weakness worsening, and has to cancel this appt due to transportation issues. She is still having the weakness and would like some clinical advice on what to do in the meantime before her next appt (I did not reschedule or cancel due to symptoms)  Please warm transfer Red Word call to Nurse Triage and then click Send Message and Close CRM. >> Mar 23, 2024  3:16 PM Drema MATSU wrote: Pt haven't had a bowel in a couple days and want to know what was recommended. Pt forgot. >> Mar 23, 2024 10:33 AM Berneda FALCON wrote: Reason for Triage: Patient was scheduled by NT for an appt today at [facility] for weakness worsening, and has to cancel this appt due to transportation issues. She is still having the weakness and would like some clinical advice on what to do in the meantime before her next appt (I did not reschedule or cancel due to symptoms)

## 2024-03-27 ENCOUNTER — Emergency Department (HOSPITAL_BASED_OUTPATIENT_CLINIC_OR_DEPARTMENT_OTHER)

## 2024-03-27 ENCOUNTER — Encounter (HOSPITAL_BASED_OUTPATIENT_CLINIC_OR_DEPARTMENT_OTHER): Payer: Self-pay | Admitting: Emergency Medicine

## 2024-03-27 ENCOUNTER — Emergency Department (HOSPITAL_BASED_OUTPATIENT_CLINIC_OR_DEPARTMENT_OTHER): Admission: EM | Admit: 2024-03-27 | Discharge: 2024-03-28 | Disposition: A

## 2024-03-27 DIAGNOSIS — Z79899 Other long term (current) drug therapy: Secondary | ICD-10-CM | POA: Insufficient documentation

## 2024-03-27 DIAGNOSIS — G20A1 Parkinson's disease without dyskinesia, without mention of fluctuations: Secondary | ICD-10-CM | POA: Insufficient documentation

## 2024-03-27 DIAGNOSIS — G20B2 Parkinson's disease with dyskinesia, with fluctuations: Secondary | ICD-10-CM

## 2024-03-27 LAB — CBC
HCT: 33.7 % — ABNORMAL LOW (ref 36.0–46.0)
Hemoglobin: 10.7 g/dL — ABNORMAL LOW (ref 12.0–15.0)
MCH: 29 pg (ref 26.0–34.0)
MCHC: 31.8 g/dL (ref 30.0–36.0)
MCV: 91.3 fL (ref 80.0–100.0)
Platelets: 349 10*3/uL (ref 150–400)
RBC: 3.69 MIL/uL — ABNORMAL LOW (ref 3.87–5.11)
RDW: 19.1 % — ABNORMAL HIGH (ref 11.5–15.5)
WBC: 8.3 10*3/uL (ref 4.0–10.5)
nRBC: 0 % (ref 0.0–0.2)

## 2024-03-27 LAB — URINALYSIS, ROUTINE W REFLEX MICROSCOPIC
Bilirubin Urine: NEGATIVE
Glucose, UA: NEGATIVE mg/dL
Hgb urine dipstick: NEGATIVE
Ketones, ur: NEGATIVE mg/dL
Nitrite: NEGATIVE
Protein, ur: 30 mg/dL — AB
Specific Gravity, Urine: 1.031 — ABNORMAL HIGH (ref 1.005–1.030)
pH: 6 (ref 5.0–8.0)

## 2024-03-27 LAB — COMPREHENSIVE METABOLIC PANEL WITH GFR
ALT: 10 U/L (ref 0–44)
AST: 22 U/L (ref 15–41)
Albumin: 3.9 g/dL (ref 3.5–5.0)
Alkaline Phosphatase: 72 U/L (ref 38–126)
Anion gap: 11 (ref 5–15)
BUN: 17 mg/dL (ref 8–23)
CO2: 27 mmol/L (ref 22–32)
Calcium: 9.8 mg/dL (ref 8.9–10.3)
Chloride: 105 mmol/L (ref 98–111)
Creatinine, Ser: 0.92 mg/dL (ref 0.44–1.00)
GFR, Estimated: >60 mL/min
Glucose, Bld: 99 mg/dL (ref 70–99)
Potassium: 3.9 mmol/L (ref 3.5–5.1)
Sodium: 142 mmol/L (ref 135–145)
Total Bilirubin: 0.6 mg/dL (ref 0.0–1.2)
Total Protein: 7.8 g/dL (ref 6.5–8.1)

## 2024-03-27 LAB — CBG MONITORING, ED: Glucose-Capillary: 93 mg/dL (ref 70–99)

## 2024-03-28 MED ORDER — CARBIDOPA-LEVODOPA ER 25-100 MG PO TBCR
1.0000 | EXTENDED_RELEASE_TABLET | Freq: Two times a day (BID) | ORAL | Status: DC
  Filled 2024-03-28: qty 1

## 2024-03-28 MED ORDER — CARBIDOPA-LEVODOPA 25-100 MG PO TABS: 1.0000 | ORAL_TABLET | Freq: Two times a day (BID) | ORAL | 1 refills | Status: AC

## 2024-03-28 MED ORDER — CARBIDOPA-LEVODOPA ER 25-100 MG PO TBCR: 1.0000 | EXTENDED_RELEASE_TABLET | Freq: Two times a day (BID) | ORAL | 1 refills | Status: AC

## 2024-03-28 MED ORDER — CARBIDOPA-LEVODOPA 25-100 MG PO TABS: 1.0000 | ORAL_TABLET | Freq: Two times a day (BID) | ORAL | 1 refills | Status: DC

## 2024-03-28 MED ORDER — CARBIDOPA-LEVODOPA ER 50-200 MG PO TBCR: 1.0000 | EXTENDED_RELEASE_TABLET | Freq: Two times a day (BID) | ORAL | 1 refills | Status: AC

## 2024-03-28 NOTE — Discharge Instructions (Signed)
 Your Parkinson's has not been treated because you are not taking medicine for Parkinson's.  I am starting you on Sinemet  (carbidopa  levodopa  100-25).  We are starting you at 1 tablet twice a day.  This is a low dose and you will likely need to titrate up on your medication.  You cannot do this at an ER and you need to be seeing a neurologist.  Please do not by her your neurologist because they are the experts in treatment of your condition.  The only person who can help you is your neurologist whether you are seeing their student or not. Get help right away if: You were hurt in a fall. You can't swallow without choking. You have chest pain or trouble breathing. You don't feel safe at home. These symptoms may be an emergency. Call 911 right away. Do not wait to see if the symptoms will go away. Do not drive yourself to the hospital. Also, get help right away if: You feel like you may hurt yourself or others. You have thoughts about taking your own life. Take one of these steps: Go to your nearest emergency room. Call 911. Call the National Suicide Prevention Lifeline at (573)482-7690 or 988. Text the Crisis Text Line at 2627730624.

## 2024-03-29 ENCOUNTER — Telehealth: Payer: Self-pay

## 2024-03-29 ENCOUNTER — Telehealth: Payer: Self-pay | Admitting: Family Medicine

## 2024-03-29 ENCOUNTER — Encounter: Payer: Self-pay | Admitting: Internal Medicine

## 2024-03-29 NOTE — Telephone Encounter (Signed)
 Of note, patient canceled an appt in Feb 2025 and stated she will be seen at another office. See phone note from 03/30/23.

## 2024-03-29 NOTE — Telephone Encounter (Signed)
 Copied from CRM [id number]. Topic: Referral - Request for Referral >> Mar 29, 2024  4:05 PM Mallory Klein wrote: Did the patient discuss referral with their provider in the last year? Yes (If No - schedule appointment) (If Yes - send message)  Appointment offered? Yes  Type of order/referral and detailed reason for visit: Neurologist, was referred by PCP but office is req referral. Cannot walk, slow pace  Preference of office, provider, location: [facility] [address]   If referral order, have you been seen by this specialty before? Yes (If Yes, this issue or another issue? When? Where? [facility] [address]   Can we respond through MyChart? Yes

## 2024-03-29 NOTE — Telephone Encounter (Signed)
 Patient called to schedule an appointment as instructed by PCP. Patient went to the ED on 03/27/24 and was referred to Neurologist, Asberry Tat.  Patient said she was told by her PCP to see Dr. Buck, have not been taking Parkinson's medications. Ask patient to have PCP send a referral and will be glad to schedule appointment.

## 2024-05-01 ENCOUNTER — Ambulatory Visit: Admitting: Internal Medicine

## 2024-05-15 ENCOUNTER — Ambulatory Visit: Admitting: Internal Medicine
